# Patient Record
Sex: Female | Born: 1950 | Race: White | Hispanic: No | Marital: Married | Smoking: Never smoker
Health system: Southern US, Community
[De-identification: ages and names within clinical notes are randomized; demographics above are authoritative.]

## PROBLEM LIST (undated history)

## (undated) DIAGNOSIS — N2581 Secondary hyperparathyroidism of renal origin: Secondary | ICD-10-CM

## (undated) DIAGNOSIS — E785 Hyperlipidemia, unspecified: Secondary | ICD-10-CM

## (undated) DIAGNOSIS — I480 Paroxysmal atrial fibrillation: Secondary | ICD-10-CM

## (undated) DIAGNOSIS — E119 Type 2 diabetes mellitus without complications: Secondary | ICD-10-CM

## (undated) DIAGNOSIS — Z87898 Personal history of other specified conditions: Secondary | ICD-10-CM

## (undated) DIAGNOSIS — E049 Nontoxic goiter, unspecified: Secondary | ICD-10-CM

## (undated) DIAGNOSIS — I428 Other cardiomyopathies: Secondary | ICD-10-CM

## (undated) DIAGNOSIS — G4733 Obstructive sleep apnea (adult) (pediatric): Secondary | ICD-10-CM

## (undated) DIAGNOSIS — D649 Anemia, unspecified: Secondary | ICD-10-CM

## (undated) DIAGNOSIS — T4145XA Adverse effect of unspecified anesthetic, initial encounter: Secondary | ICD-10-CM

## (undated) DIAGNOSIS — Z950 Presence of cardiac pacemaker: Secondary | ICD-10-CM

## (undated) DIAGNOSIS — E063 Autoimmune thyroiditis: Secondary | ICD-10-CM

## (undated) DIAGNOSIS — I10 Essential (primary) hypertension: Secondary | ICD-10-CM

## (undated) DIAGNOSIS — N159 Renal tubulo-interstitial disease, unspecified: Secondary | ICD-10-CM

## (undated) DIAGNOSIS — L97919 Non-pressure chronic ulcer of unspecified part of right lower leg with unspecified severity: Secondary | ICD-10-CM

## (undated) DIAGNOSIS — R5383 Other fatigue: Secondary | ICD-10-CM

## (undated) DIAGNOSIS — E559 Vitamin D deficiency, unspecified: Secondary | ICD-10-CM

## (undated) DIAGNOSIS — I4891 Unspecified atrial fibrillation: Secondary | ICD-10-CM

## (undated) DIAGNOSIS — Z905 Acquired absence of kidney: Secondary | ICD-10-CM

## (undated) DIAGNOSIS — L97929 Non-pressure chronic ulcer of unspecified part of left lower leg with unspecified severity: Secondary | ICD-10-CM

## (undated) DIAGNOSIS — Z5189 Encounter for other specified aftercare: Secondary | ICD-10-CM

## (undated) DIAGNOSIS — M797 Fibromyalgia: Secondary | ICD-10-CM

## (undated) DIAGNOSIS — N8502 Endometrial intraepithelial neoplasia [EIN]: Secondary | ICD-10-CM

## (undated) DIAGNOSIS — I2721 Secondary pulmonary arterial hypertension: Secondary | ICD-10-CM

## (undated) DIAGNOSIS — R931 Abnormal findings on diagnostic imaging of heart and coronary circulation: Secondary | ICD-10-CM

## (undated) DIAGNOSIS — I5030 Unspecified diastolic (congestive) heart failure: Secondary | ICD-10-CM

## (undated) DIAGNOSIS — Z8679 Personal history of other diseases of the circulatory system: Secondary | ICD-10-CM

## (undated) DIAGNOSIS — I2789 Other specified pulmonary heart diseases: Secondary | ICD-10-CM

## (undated) HISTORY — PX: CHOLECYSTECTOMY: SHX55

## (undated) HISTORY — DX: Personal history of other specified conditions: Z87.898

## (undated) HISTORY — DX: Vitamin D deficiency, unspecified: E55.9

## (undated) HISTORY — DX: Other cardiomyopathies: I42.8

## (undated) HISTORY — DX: Renal tubulo-interstitial disease, unspecified: N15.9

## (undated) HISTORY — DX: Secondary pulmonary arterial hypertension: I27.21

## (undated) HISTORY — DX: Morbid (severe) obesity due to excess calories: E66.01

## (undated) HISTORY — DX: Fibromyalgia: M79.7

## (undated) HISTORY — DX: Unspecified atrial fibrillation: I48.91

## (undated) HISTORY — DX: Type 2 diabetes mellitus without complications: E11.9

## (undated) HISTORY — DX: Endometrial intraepithelial neoplasia (EIN): N85.02

## (undated) HISTORY — DX: Other specified pulmonary heart diseases: I27.89

## (undated) HISTORY — DX: Personal history of other diseases of the circulatory system: Z86.79

## (undated) HISTORY — DX: Essential (primary) hypertension: I10

## (undated) HISTORY — PX: KNEE SURGERY: SHX244

## (undated) HISTORY — DX: Hyperlipidemia, unspecified: E78.5

## (undated) HISTORY — PX: OTHER SURGICAL HISTORY: SHX169

## (undated) HISTORY — DX: Other fatigue: R53.83

## (undated) HISTORY — DX: Autoimmune thyroiditis: E06.3

## (undated) HISTORY — DX: Nontoxic goiter, unspecified: E04.9

## (undated) HISTORY — DX: Presence of cardiac pacemaker: Z95.0

## (undated) HISTORY — DX: Abnormal findings on diagnostic imaging of heart and coronary circulation: R93.1

## (undated) HISTORY — DX: Secondary hyperparathyroidism of renal origin: N25.81

## (undated) HISTORY — DX: Obstructive sleep apnea (adult) (pediatric): G47.33

## (undated) HISTORY — DX: Acquired absence of kidney: Z90.5

---

## 1976-01-01 DIAGNOSIS — Z5189 Encounter for other specified aftercare: Secondary | ICD-10-CM

## 1976-01-01 DIAGNOSIS — IMO0001 Reserved for inherently not codable concepts without codable children: Secondary | ICD-10-CM

## 1976-01-01 HISTORY — DX: Encounter for other specified aftercare: Z51.89

## 1976-01-01 HISTORY — DX: Reserved for inherently not codable concepts without codable children: IMO0001

## 1976-12-31 HISTORY — PX: ECTOPIC PREGNANCY SURGERY: SHX613

## 1997-12-31 HISTORY — PX: HERNIA REPAIR: SHX51

## 1998-05-10 ENCOUNTER — Inpatient Hospital Stay (HOSPITAL_COMMUNITY): Admission: EM | Admit: 1998-05-10 | Discharge: 1998-05-15 | Payer: Self-pay | Admitting: Emergency Medicine

## 1998-06-01 ENCOUNTER — Ambulatory Visit (HOSPITAL_COMMUNITY): Admission: RE | Admit: 1998-06-01 | Discharge: 1998-06-01 | Payer: Self-pay

## 1998-06-01 ENCOUNTER — Inpatient Hospital Stay (HOSPITAL_COMMUNITY): Admission: AD | Admit: 1998-06-01 | Discharge: 1998-06-13 | Payer: Self-pay

## 1998-06-01 ENCOUNTER — Other Ambulatory Visit: Admission: RE | Admit: 1998-06-01 | Discharge: 1998-06-01 | Payer: Self-pay | Admitting: Internal Medicine

## 1998-07-15 ENCOUNTER — Ambulatory Visit (HOSPITAL_COMMUNITY): Admission: RE | Admit: 1998-07-15 | Discharge: 1998-07-15 | Payer: Self-pay | Admitting: Cardiology

## 1998-07-18 ENCOUNTER — Observation Stay (HOSPITAL_COMMUNITY): Admission: AD | Admit: 1998-07-18 | Discharge: 1998-07-19 | Payer: Self-pay | Admitting: Cardiology

## 1998-11-08 ENCOUNTER — Inpatient Hospital Stay (HOSPITAL_COMMUNITY): Admission: AD | Admit: 1998-11-08 | Discharge: 1998-11-15 | Payer: Self-pay | Admitting: Internal Medicine

## 1998-11-09 ENCOUNTER — Encounter: Payer: Self-pay | Admitting: Internal Medicine

## 1998-11-14 ENCOUNTER — Encounter: Payer: Self-pay | Admitting: Internal Medicine

## 1999-08-20 ENCOUNTER — Inpatient Hospital Stay (HOSPITAL_COMMUNITY): Admission: EM | Admit: 1999-08-20 | Discharge: 1999-08-23 | Payer: Self-pay | Admitting: Emergency Medicine

## 1999-08-21 ENCOUNTER — Encounter: Payer: Self-pay | Admitting: Cardiology

## 1999-09-07 ENCOUNTER — Encounter: Admission: RE | Admit: 1999-09-07 | Discharge: 1999-12-06 | Payer: Self-pay | Admitting: Internal Medicine

## 1999-10-12 ENCOUNTER — Encounter: Payer: Self-pay | Admitting: Internal Medicine

## 1999-10-12 ENCOUNTER — Ambulatory Visit (HOSPITAL_COMMUNITY): Admission: RE | Admit: 1999-10-12 | Discharge: 1999-10-12 | Payer: Self-pay | Admitting: Internal Medicine

## 1999-12-08 ENCOUNTER — Encounter: Admission: RE | Admit: 1999-12-08 | Discharge: 2000-03-07 | Payer: Self-pay | Admitting: Internal Medicine

## 2000-01-17 ENCOUNTER — Encounter: Admission: RE | Admit: 2000-01-17 | Discharge: 2000-01-17 | Payer: Self-pay | Admitting: Internal Medicine

## 2000-01-17 ENCOUNTER — Encounter: Payer: Self-pay | Admitting: Internal Medicine

## 2000-01-25 ENCOUNTER — Encounter: Payer: Self-pay | Admitting: Internal Medicine

## 2000-01-25 ENCOUNTER — Inpatient Hospital Stay (HOSPITAL_COMMUNITY): Admission: EM | Admit: 2000-01-25 | Discharge: 2000-02-04 | Payer: Self-pay | Admitting: Emergency Medicine

## 2000-01-27 ENCOUNTER — Encounter: Payer: Self-pay | Admitting: Internal Medicine

## 2000-02-02 ENCOUNTER — Encounter: Payer: Self-pay | Admitting: Internal Medicine

## 2000-03-01 ENCOUNTER — Ambulatory Visit (HOSPITAL_COMMUNITY): Admission: RE | Admit: 2000-03-01 | Discharge: 2000-03-01 | Payer: Self-pay | Admitting: Cardiology

## 2000-03-04 ENCOUNTER — Encounter: Payer: Self-pay | Admitting: Cardiology

## 2000-03-04 ENCOUNTER — Ambulatory Visit (HOSPITAL_COMMUNITY): Admission: RE | Admit: 2000-03-04 | Discharge: 2000-03-04 | Payer: Self-pay | Admitting: Cardiology

## 2000-03-27 ENCOUNTER — Encounter: Payer: Self-pay | Admitting: Internal Medicine

## 2000-03-27 ENCOUNTER — Encounter: Admission: RE | Admit: 2000-03-27 | Discharge: 2000-03-27 | Payer: Self-pay | Admitting: Internal Medicine

## 2000-05-10 ENCOUNTER — Encounter: Payer: Self-pay | Admitting: Internal Medicine

## 2000-05-10 ENCOUNTER — Inpatient Hospital Stay (HOSPITAL_COMMUNITY): Admission: EM | Admit: 2000-05-10 | Discharge: 2000-05-21 | Payer: Self-pay | Admitting: Emergency Medicine

## 2000-05-23 ENCOUNTER — Encounter (HOSPITAL_COMMUNITY): Admission: RE | Admit: 2000-05-23 | Discharge: 2000-08-21 | Payer: Self-pay | Admitting: Internal Medicine

## 2000-06-05 ENCOUNTER — Emergency Department (HOSPITAL_COMMUNITY): Admission: EM | Admit: 2000-06-05 | Discharge: 2000-06-05 | Payer: Self-pay | Admitting: Emergency Medicine

## 2000-11-25 ENCOUNTER — Encounter: Admission: RE | Admit: 2000-11-25 | Discharge: 2001-01-07 | Payer: Self-pay | Admitting: Internal Medicine

## 2001-01-15 ENCOUNTER — Encounter: Admission: RE | Admit: 2001-01-15 | Discharge: 2001-02-18 | Payer: Self-pay | Admitting: Internal Medicine

## 2001-08-12 ENCOUNTER — Ambulatory Visit (HOSPITAL_COMMUNITY): Admission: RE | Admit: 2001-08-12 | Discharge: 2001-08-12 | Payer: Self-pay | Admitting: Cardiology

## 2001-08-13 ENCOUNTER — Encounter: Payer: Self-pay | Admitting: Cardiology

## 2002-04-09 ENCOUNTER — Encounter: Admission: RE | Admit: 2002-04-09 | Discharge: 2002-04-09 | Payer: Self-pay | Admitting: Cardiology

## 2002-04-09 ENCOUNTER — Encounter: Payer: Self-pay | Admitting: Cardiology

## 2002-04-13 ENCOUNTER — Ambulatory Visit (HOSPITAL_COMMUNITY): Admission: RE | Admit: 2002-04-13 | Discharge: 2002-04-14 | Payer: Self-pay | Admitting: Cardiology

## 2002-04-24 ENCOUNTER — Inpatient Hospital Stay (HOSPITAL_COMMUNITY): Admission: AD | Admit: 2002-04-24 | Discharge: 2002-04-26 | Payer: Self-pay | Admitting: Cardiology

## 2002-11-20 ENCOUNTER — Encounter: Payer: Self-pay | Admitting: Family Medicine

## 2002-11-20 ENCOUNTER — Encounter: Admission: RE | Admit: 2002-11-20 | Discharge: 2002-11-20 | Payer: Self-pay | Admitting: Internal Medicine

## 2003-03-08 ENCOUNTER — Encounter: Payer: Self-pay | Admitting: Internal Medicine

## 2003-03-08 ENCOUNTER — Encounter: Admission: RE | Admit: 2003-03-08 | Discharge: 2003-03-08 | Payer: Self-pay | Admitting: Internal Medicine

## 2003-04-19 ENCOUNTER — Encounter: Admission: RE | Admit: 2003-04-19 | Discharge: 2003-07-18 | Payer: Self-pay | Admitting: Internal Medicine

## 2003-07-23 ENCOUNTER — Emergency Department (HOSPITAL_COMMUNITY): Admission: AD | Admit: 2003-07-23 | Discharge: 2003-07-23 | Payer: Self-pay | Admitting: Emergency Medicine

## 2004-01-24 ENCOUNTER — Encounter: Admission: RE | Admit: 2004-01-24 | Discharge: 2004-01-24 | Payer: Self-pay | Admitting: Cardiology

## 2004-01-27 ENCOUNTER — Ambulatory Visit (HOSPITAL_COMMUNITY): Admission: RE | Admit: 2004-01-27 | Discharge: 2004-01-27 | Payer: Self-pay | Admitting: Cardiology

## 2005-02-26 ENCOUNTER — Encounter: Admission: RE | Admit: 2005-02-26 | Discharge: 2005-02-26 | Payer: Self-pay | Admitting: Cardiology

## 2005-03-02 ENCOUNTER — Ambulatory Visit (HOSPITAL_COMMUNITY): Admission: RE | Admit: 2005-03-02 | Discharge: 2005-03-03 | Payer: Self-pay | Admitting: *Deleted

## 2005-05-29 ENCOUNTER — Ambulatory Visit: Payer: Self-pay | Admitting: Internal Medicine

## 2005-07-05 ENCOUNTER — Encounter: Admission: RE | Admit: 2005-07-05 | Discharge: 2005-07-05 | Payer: Self-pay | Admitting: Cardiology

## 2005-07-20 ENCOUNTER — Ambulatory Visit (HOSPITAL_COMMUNITY): Admission: RE | Admit: 2005-07-20 | Discharge: 2005-07-20 | Payer: Self-pay | Admitting: Internal Medicine

## 2005-08-21 ENCOUNTER — Encounter: Admission: RE | Admit: 2005-08-21 | Discharge: 2005-08-21 | Payer: Self-pay | Admitting: Internal Medicine

## 2005-10-19 ENCOUNTER — Encounter: Admission: RE | Admit: 2005-10-19 | Discharge: 2005-10-19 | Payer: Self-pay | Admitting: Internal Medicine

## 2005-10-30 ENCOUNTER — Ambulatory Visit: Payer: Self-pay | Admitting: "Endocrinology

## 2005-11-27 ENCOUNTER — Ambulatory Visit: Payer: Self-pay | Admitting: Internal Medicine

## 2005-12-04 ENCOUNTER — Ambulatory Visit: Payer: Self-pay | Admitting: "Endocrinology

## 2006-01-09 ENCOUNTER — Ambulatory Visit: Payer: Self-pay | Admitting: "Endocrinology

## 2006-02-22 ENCOUNTER — Encounter: Admission: RE | Admit: 2006-02-22 | Discharge: 2006-02-22 | Payer: Self-pay | Admitting: Cardiology

## 2006-02-26 ENCOUNTER — Ambulatory Visit: Payer: Self-pay | Admitting: "Endocrinology

## 2006-02-27 ENCOUNTER — Ambulatory Visit (HOSPITAL_COMMUNITY): Admission: RE | Admit: 2006-02-27 | Discharge: 2006-02-28 | Payer: Self-pay | Admitting: Cardiology

## 2006-03-11 ENCOUNTER — Encounter: Admission: RE | Admit: 2006-03-11 | Discharge: 2006-03-11 | Payer: Self-pay | Admitting: Internal Medicine

## 2006-03-12 ENCOUNTER — Encounter: Admission: RE | Admit: 2006-03-12 | Discharge: 2006-03-12 | Payer: Self-pay | Admitting: Internal Medicine

## 2006-04-25 ENCOUNTER — Encounter (INDEPENDENT_AMBULATORY_CARE_PROVIDER_SITE_OTHER): Payer: Self-pay | Admitting: Specialist

## 2006-04-25 ENCOUNTER — Inpatient Hospital Stay (HOSPITAL_COMMUNITY): Admission: RE | Admit: 2006-04-25 | Discharge: 2006-04-27 | Payer: Self-pay | Admitting: General Surgery

## 2006-04-30 ENCOUNTER — Ambulatory Visit: Payer: Self-pay | Admitting: "Endocrinology

## 2006-05-28 ENCOUNTER — Ambulatory Visit: Payer: Self-pay | Admitting: Internal Medicine

## 2006-07-31 ENCOUNTER — Ambulatory Visit: Payer: Self-pay | Admitting: "Endocrinology

## 2006-09-22 ENCOUNTER — Inpatient Hospital Stay (HOSPITAL_COMMUNITY): Admission: EM | Admit: 2006-09-22 | Discharge: 2006-10-01 | Payer: Self-pay | Admitting: *Deleted

## 2006-09-22 ENCOUNTER — Ambulatory Visit: Payer: Self-pay | Admitting: Infectious Diseases

## 2006-09-23 ENCOUNTER — Encounter: Payer: Self-pay | Admitting: Vascular Surgery

## 2006-11-04 ENCOUNTER — Ambulatory Visit: Payer: Self-pay | Admitting: "Endocrinology

## 2006-11-26 ENCOUNTER — Ambulatory Visit: Payer: Self-pay | Admitting: Internal Medicine

## 2006-12-17 ENCOUNTER — Ambulatory Visit: Payer: Self-pay | Admitting: "Endocrinology

## 2007-02-05 ENCOUNTER — Ambulatory Visit (HOSPITAL_COMMUNITY): Admission: RE | Admit: 2007-02-05 | Discharge: 2007-02-06 | Payer: Self-pay | Admitting: Orthopedic Surgery

## 2007-03-18 ENCOUNTER — Ambulatory Visit: Payer: Self-pay | Admitting: "Endocrinology

## 2007-05-19 ENCOUNTER — Ambulatory Visit: Payer: Self-pay | Admitting: Internal Medicine

## 2007-07-03 ENCOUNTER — Ambulatory Visit: Payer: Self-pay | Admitting: "Endocrinology

## 2007-08-20 ENCOUNTER — Encounter: Admission: RE | Admit: 2007-08-20 | Discharge: 2007-08-20 | Payer: Self-pay | Admitting: Cardiology

## 2007-08-26 ENCOUNTER — Inpatient Hospital Stay (HOSPITAL_COMMUNITY): Admission: RE | Admit: 2007-08-26 | Discharge: 2007-08-27 | Payer: Self-pay | Admitting: Cardiology

## 2007-08-27 ENCOUNTER — Encounter (INDEPENDENT_AMBULATORY_CARE_PROVIDER_SITE_OTHER): Payer: Self-pay | Admitting: Interventional Radiology

## 2007-10-06 ENCOUNTER — Ambulatory Visit: Payer: Self-pay | Admitting: "Endocrinology

## 2007-11-18 ENCOUNTER — Ambulatory Visit: Payer: Self-pay | Admitting: Internal Medicine

## 2007-12-09 ENCOUNTER — Ambulatory Visit: Payer: Self-pay | Admitting: Internal Medicine

## 2007-12-12 ENCOUNTER — Encounter: Payer: Self-pay | Admitting: Internal Medicine

## 2008-01-14 ENCOUNTER — Encounter: Admission: RE | Admit: 2008-01-14 | Discharge: 2008-01-14 | Payer: Self-pay | Admitting: "Endocrinology

## 2008-01-22 ENCOUNTER — Ambulatory Visit: Payer: Self-pay | Admitting: "Endocrinology

## 2008-02-04 ENCOUNTER — Encounter: Payer: Self-pay | Admitting: Internal Medicine

## 2008-04-21 ENCOUNTER — Encounter: Payer: Self-pay | Admitting: Internal Medicine

## 2008-04-26 ENCOUNTER — Ambulatory Visit: Payer: Self-pay | Admitting: "Endocrinology

## 2008-05-17 DIAGNOSIS — I2789 Other specified pulmonary heart diseases: Secondary | ICD-10-CM

## 2008-05-17 DIAGNOSIS — Z8679 Personal history of other diseases of the circulatory system: Secondary | ICD-10-CM

## 2008-05-17 DIAGNOSIS — Z950 Presence of cardiac pacemaker: Secondary | ICD-10-CM

## 2008-05-17 DIAGNOSIS — I10 Essential (primary) hypertension: Secondary | ICD-10-CM | POA: Insufficient documentation

## 2008-05-17 DIAGNOSIS — G4733 Obstructive sleep apnea (adult) (pediatric): Secondary | ICD-10-CM

## 2008-05-17 DIAGNOSIS — E119 Type 2 diabetes mellitus without complications: Secondary | ICD-10-CM

## 2008-05-17 DIAGNOSIS — E063 Autoimmune thyroiditis: Secondary | ICD-10-CM

## 2008-05-18 ENCOUNTER — Ambulatory Visit: Payer: Self-pay | Admitting: Internal Medicine

## 2008-05-27 ENCOUNTER — Encounter: Payer: Self-pay | Admitting: Internal Medicine

## 2008-07-01 ENCOUNTER — Ambulatory Visit (HOSPITAL_COMMUNITY): Admission: RE | Admit: 2008-07-01 | Discharge: 2008-07-02 | Payer: Self-pay | Admitting: Orthopedic Surgery

## 2008-07-19 ENCOUNTER — Encounter: Payer: Self-pay | Admitting: Internal Medicine

## 2008-10-15 ENCOUNTER — Ambulatory Visit: Payer: Self-pay | Admitting: Internal Medicine

## 2008-10-21 ENCOUNTER — Ambulatory Visit: Payer: Self-pay | Admitting: "Endocrinology

## 2008-11-16 ENCOUNTER — Encounter: Payer: Self-pay | Admitting: "Endocrinology

## 2008-11-16 LAB — CONVERTED CEMR LAB
Calcium, Total (PTH): 8.5 mg/dL (ref 8.4–10.5)
PTH: 179.1 pg/mL — ABNORMAL HIGH (ref 14.0–72.0)
Vit D, 1,25-Dihydroxy: 38 (ref 30–89)

## 2008-11-23 ENCOUNTER — Ambulatory Visit: Payer: Self-pay | Admitting: Internal Medicine

## 2008-11-23 DIAGNOSIS — R0602 Shortness of breath: Secondary | ICD-10-CM

## 2008-12-03 ENCOUNTER — Ambulatory Visit: Payer: Self-pay | Admitting: Internal Medicine

## 2008-12-10 ENCOUNTER — Telehealth (INDEPENDENT_AMBULATORY_CARE_PROVIDER_SITE_OTHER): Payer: Self-pay | Admitting: *Deleted

## 2008-12-23 ENCOUNTER — Encounter: Payer: Self-pay | Admitting: Internal Medicine

## 2008-12-27 ENCOUNTER — Ambulatory Visit: Payer: Self-pay | Admitting: Internal Medicine

## 2008-12-31 DIAGNOSIS — T8859XA Other complications of anesthesia, initial encounter: Secondary | ICD-10-CM

## 2008-12-31 HISTORY — DX: Other complications of anesthesia, initial encounter: T88.59XA

## 2009-02-01 ENCOUNTER — Ambulatory Visit: Payer: Self-pay | Admitting: Internal Medicine

## 2009-02-23 ENCOUNTER — Ambulatory Visit: Payer: Self-pay | Admitting: "Endocrinology

## 2009-02-25 ENCOUNTER — Ambulatory Visit: Payer: Self-pay | Admitting: Internal Medicine

## 2009-03-22 ENCOUNTER — Encounter: Payer: Self-pay | Admitting: Internal Medicine

## 2009-04-18 ENCOUNTER — Ambulatory Visit: Payer: Self-pay | Admitting: Internal Medicine

## 2009-04-28 ENCOUNTER — Encounter: Payer: Self-pay | Admitting: Internal Medicine

## 2009-05-02 ENCOUNTER — Ambulatory Visit: Payer: Self-pay | Admitting: Internal Medicine

## 2009-05-02 LAB — CONVERTED CEMR LAB
BUN: 36 mg/dL — ABNORMAL HIGH (ref 6–23)
Basophils Relative: 0.2 % (ref 0.0–3.0)
CO2: 32 meq/L (ref 19–32)
Chloride: 102 meq/L (ref 96–112)
Creatinine, Ser: 1.6 mg/dL — ABNORMAL HIGH (ref 0.4–1.2)
Eosinophils Relative: 1.1 % (ref 0.0–5.0)
Hemoglobin: 13.5 g/dL (ref 12.0–15.0)
Lymphocytes Relative: 17.7 % (ref 12.0–46.0)
MCHC: 34.4 g/dL (ref 30.0–36.0)
Monocytes Relative: 6.1 % (ref 3.0–12.0)
Neutro Abs: 5.5 10*3/uL (ref 1.4–7.7)
Neutrophils Relative %: 74.9 % (ref 43.0–77.0)
Potassium: 4 meq/L (ref 3.5–5.1)
Prothrombin Time: 25.2 s — ABNORMAL HIGH (ref 10.9–13.3)
RBC: 4.31 M/uL (ref 3.87–5.11)
WBC: 7.4 10*3/uL (ref 4.5–10.5)

## 2009-05-10 ENCOUNTER — Ambulatory Visit (HOSPITAL_COMMUNITY): Admission: RE | Admit: 2009-05-10 | Discharge: 2009-05-10 | Payer: Self-pay | Admitting: Internal Medicine

## 2009-05-10 ENCOUNTER — Ambulatory Visit: Payer: Self-pay | Admitting: Internal Medicine

## 2009-05-16 ENCOUNTER — Ambulatory Visit (HOSPITAL_COMMUNITY): Admission: RE | Admit: 2009-05-16 | Discharge: 2009-05-16 | Payer: Self-pay | Admitting: "Endocrinology

## 2009-05-27 ENCOUNTER — Ambulatory Visit: Payer: Self-pay | Admitting: Cardiology

## 2009-05-31 ENCOUNTER — Encounter: Payer: Self-pay | Admitting: Internal Medicine

## 2009-06-09 ENCOUNTER — Ambulatory Visit: Payer: Self-pay | Admitting: "Endocrinology

## 2009-06-27 ENCOUNTER — Ambulatory Visit: Payer: Self-pay | Admitting: Internal Medicine

## 2009-06-28 ENCOUNTER — Ambulatory Visit: Payer: Self-pay | Admitting: Internal Medicine

## 2009-07-05 ENCOUNTER — Encounter: Payer: Self-pay | Admitting: Internal Medicine

## 2009-07-05 LAB — CBC WITH DIFFERENTIAL/PLATELET
BASO%: 0.5 % (ref 0.0–2.0)
HCT: 36.8 % (ref 34.8–46.6)
LYMPH%: 11.2 % — ABNORMAL LOW (ref 14.0–49.7)
MCHC: 33.5 g/dL (ref 31.5–36.0)
MCV: 91.6 fL (ref 79.5–101.0)
MONO#: 0.4 10*3/uL (ref 0.1–0.9)
MONO%: 3.8 % (ref 0.0–14.0)
NEUT%: 83.9 % — ABNORMAL HIGH (ref 38.4–76.8)
Platelets: 181 10*3/uL (ref 145–400)
WBC: 9.7 10*3/uL (ref 3.9–10.3)

## 2009-07-07 LAB — LACTATE DEHYDROGENASE: LDH: 193 U/L (ref 94–250)

## 2009-07-07 LAB — COMPREHENSIVE METABOLIC PANEL
Alkaline Phosphatase: 70 U/L (ref 39–117)
CO2: 26 mEq/L (ref 19–32)
Creatinine, Ser: 1.38 mg/dL — ABNORMAL HIGH (ref 0.40–1.20)
Glucose, Bld: 164 mg/dL — ABNORMAL HIGH (ref 70–99)
Total Bilirubin: 0.6 mg/dL (ref 0.3–1.2)

## 2009-07-07 LAB — IMMUNOFIXATION ELECTROPHORESIS
IgA: 808 mg/dL — ABNORMAL HIGH (ref 68–378)
IgM, Serum: 77 mg/dL (ref 60–263)
Total Protein, Serum Electrophoresis: 7.3 g/dL (ref 6.0–8.3)

## 2009-07-11 LAB — UIFE/LIGHT CHAINS/TP QN, 24-HR UR
Albumin, U: DETECTED
Alpha 1, Urine: DETECTED — AB
Free Kappa/Lambda Ratio: 133.79 ratio — ABNORMAL HIGH (ref 0.46–4.00)
Free Lambda Excretion/Day: 6.09 mg/d
Time: 24 hours
Total Protein, Urine-Ur/day: 839 mg/d — ABNORMAL HIGH (ref 10–140)
Total Protein, Urine: 79.9 mg/dL

## 2009-07-14 ENCOUNTER — Ambulatory Visit (HOSPITAL_COMMUNITY): Admission: RE | Admit: 2009-07-14 | Discharge: 2009-07-14 | Payer: Self-pay | Admitting: Internal Medicine

## 2009-07-15 ENCOUNTER — Encounter (INDEPENDENT_AMBULATORY_CARE_PROVIDER_SITE_OTHER): Payer: Self-pay | Admitting: Interventional Radiology

## 2009-07-15 ENCOUNTER — Ambulatory Visit (HOSPITAL_COMMUNITY): Admission: RE | Admit: 2009-07-15 | Discharge: 2009-07-15 | Payer: Self-pay | Admitting: Internal Medicine

## 2009-07-21 LAB — CBC WITH DIFFERENTIAL/PLATELET
Eosinophils Absolute: 0.1 10*3/uL (ref 0.0–0.5)
MCV: 92.4 fL (ref 79.5–101.0)
MONO#: 0.3 10*3/uL (ref 0.1–0.9)
MONO%: 5.5 % (ref 0.0–14.0)
NEUT#: 4.5 10*3/uL (ref 1.5–6.5)
RBC: 3.92 10*6/uL (ref 3.70–5.45)
RDW: 16.5 % — ABNORMAL HIGH (ref 11.2–14.5)
WBC: 5.8 10*3/uL (ref 3.9–10.3)
lymph#: 0.9 10*3/uL (ref 0.9–3.3)

## 2009-07-26 ENCOUNTER — Encounter: Payer: Self-pay | Admitting: Internal Medicine

## 2009-08-11 ENCOUNTER — Encounter: Admission: RE | Admit: 2009-08-11 | Discharge: 2009-08-11 | Payer: Self-pay | Admitting: Internal Medicine

## 2009-08-22 ENCOUNTER — Ambulatory Visit: Payer: Self-pay | Admitting: "Endocrinology

## 2009-09-09 ENCOUNTER — Ambulatory Visit: Payer: Self-pay | Admitting: Internal Medicine

## 2009-09-21 ENCOUNTER — Encounter: Admission: RE | Admit: 2009-09-21 | Discharge: 2009-09-21 | Payer: Self-pay | Admitting: Nephrology

## 2009-10-11 ENCOUNTER — Ambulatory Visit: Payer: Self-pay | Admitting: Internal Medicine

## 2009-12-12 ENCOUNTER — Encounter: Payer: Self-pay | Admitting: Internal Medicine

## 2010-02-21 ENCOUNTER — Ambulatory Visit: Payer: Self-pay | Admitting: "Endocrinology

## 2010-02-21 ENCOUNTER — Ambulatory Visit: Payer: Self-pay | Admitting: Internal Medicine

## 2010-03-24 ENCOUNTER — Ambulatory Visit: Payer: Self-pay | Admitting: Internal Medicine

## 2010-04-26 ENCOUNTER — Ambulatory Visit: Payer: Self-pay | Admitting: Internal Medicine

## 2010-05-03 ENCOUNTER — Ambulatory Visit: Payer: Self-pay | Admitting: "Endocrinology

## 2010-05-04 ENCOUNTER — Encounter: Payer: Self-pay | Admitting: Internal Medicine

## 2010-06-02 ENCOUNTER — Ambulatory Visit: Payer: Self-pay | Admitting: Internal Medicine

## 2010-06-26 ENCOUNTER — Ambulatory Visit: Payer: Self-pay | Admitting: Internal Medicine

## 2010-06-30 ENCOUNTER — Ambulatory Visit: Payer: Self-pay | Admitting: Internal Medicine

## 2010-07-31 ENCOUNTER — Encounter (INDEPENDENT_AMBULATORY_CARE_PROVIDER_SITE_OTHER): Payer: Self-pay | Admitting: *Deleted

## 2010-08-14 ENCOUNTER — Encounter: Admission: RE | Admit: 2010-08-14 | Discharge: 2010-08-14 | Payer: Self-pay | Admitting: Internal Medicine

## 2010-08-30 ENCOUNTER — Ambulatory Visit (HOSPITAL_COMMUNITY): Payer: Self-pay | Admitting: Oncology

## 2010-08-30 ENCOUNTER — Encounter (HOSPITAL_COMMUNITY)
Admission: RE | Admit: 2010-08-30 | Discharge: 2010-09-29 | Payer: Self-pay | Source: Home / Self Care | Admitting: Oncology

## 2010-09-19 ENCOUNTER — Ambulatory Visit: Payer: Self-pay | Admitting: "Endocrinology

## 2010-09-20 ENCOUNTER — Encounter: Admission: RE | Admit: 2010-09-20 | Discharge: 2010-09-20 | Payer: Self-pay | Admitting: "Endocrinology

## 2010-10-13 ENCOUNTER — Ambulatory Visit: Payer: Self-pay | Admitting: Internal Medicine

## 2010-10-27 ENCOUNTER — Ambulatory Visit (HOSPITAL_COMMUNITY): Payer: Self-pay | Admitting: Oncology

## 2010-12-11 ENCOUNTER — Encounter
Admission: RE | Admit: 2010-12-11 | Discharge: 2010-12-11 | Payer: Self-pay | Source: Home / Self Care | Attending: Cardiology | Admitting: Cardiology

## 2010-12-12 ENCOUNTER — Inpatient Hospital Stay (HOSPITAL_COMMUNITY)
Admission: EM | Admit: 2010-12-12 | Discharge: 2010-12-15 | Payer: Self-pay | Source: Home / Self Care | Attending: Cardiology | Admitting: Cardiology

## 2010-12-18 ENCOUNTER — Ambulatory Visit: Payer: Self-pay | Admitting: Internal Medicine

## 2010-12-19 ENCOUNTER — Ambulatory Visit: Payer: Self-pay | Admitting: Internal Medicine

## 2010-12-20 ENCOUNTER — Ambulatory Visit: Payer: Self-pay | Admitting: Internal Medicine

## 2010-12-26 ENCOUNTER — Encounter: Payer: Self-pay | Admitting: Internal Medicine

## 2011-01-03 ENCOUNTER — Ambulatory Visit (HOSPITAL_COMMUNITY)
Admission: RE | Admit: 2011-01-03 | Discharge: 2011-01-04 | Payer: Self-pay | Source: Home / Self Care | Attending: Cardiology | Admitting: Cardiology

## 2011-01-03 ENCOUNTER — Encounter (INDEPENDENT_AMBULATORY_CARE_PROVIDER_SITE_OTHER): Payer: Self-pay | Admitting: Cardiology

## 2011-01-03 LAB — CBC
HCT: 39.9 % (ref 36.0–46.0)
Hemoglobin: 12.6 g/dL (ref 12.0–15.0)
MCH: 29.3 pg (ref 26.0–34.0)
MCHC: 31.6 g/dL (ref 30.0–36.0)
MCV: 92.8 fL (ref 78.0–100.0)
Platelets: 168 10*3/uL (ref 150–400)
RBC: 4.3 MIL/uL (ref 3.87–5.11)
RDW: 17.3 % — ABNORMAL HIGH (ref 11.5–15.5)
WBC: 5.7 10*3/uL (ref 4.0–10.5)

## 2011-01-03 LAB — GLUCOSE, CAPILLARY
Glucose-Capillary: 183 mg/dL — ABNORMAL HIGH (ref 70–99)
Glucose-Capillary: 255 mg/dL — ABNORMAL HIGH (ref 70–99)
Glucose-Capillary: 272 mg/dL — ABNORMAL HIGH (ref 70–99)

## 2011-01-03 LAB — TSH: TSH: 2.7 u[IU]/mL (ref 0.350–4.500)

## 2011-01-03 LAB — PROTIME-INR
INR: 1.14 (ref 0.00–1.49)
Prothrombin Time: 14.8 seconds (ref 11.6–15.2)

## 2011-01-04 LAB — POCT I-STAT 3, VENOUS BLOOD GAS (G3P V)
Bicarbonate: 26.2 mEq/L — ABNORMAL HIGH (ref 20.0–24.0)
O2 Saturation: 61 %
TCO2: 28 mmol/L (ref 0–100)
pCO2, Ven: 46.1 mmHg (ref 45.0–50.0)
pH, Ven: 7.363 — ABNORMAL HIGH (ref 7.250–7.300)
pO2, Ven: 33 mmHg (ref 30.0–45.0)

## 2011-01-04 LAB — PROTIME-INR
INR: 1.1 (ref 0.00–1.49)
Prothrombin Time: 14.4 seconds (ref 11.6–15.2)

## 2011-01-04 LAB — BASIC METABOLIC PANEL
BUN: 18 mg/dL (ref 6–23)
CO2: 28 mEq/L (ref 19–32)
Calcium: 8.9 mg/dL (ref 8.4–10.5)
Chloride: 105 mEq/L (ref 96–112)
Creatinine, Ser: 1.11 mg/dL (ref 0.4–1.2)
GFR calc Af Amer: >60 mL/min (ref >60)
GFR calc non Af Amer: 50 mL/min — ABNORMAL LOW (ref >60)
Glucose, Bld: 232 mg/dL — ABNORMAL HIGH (ref 70–99)
Potassium: 4.5 mEq/L (ref 3.5–5.1)
Sodium: 138 mEq/L (ref 135–145)

## 2011-01-04 LAB — POCT I-STAT 3, ART BLOOD GAS (G3+)
Acid-Base Excess: 1 mmol/L (ref 0.0–2.0)
Bicarbonate: 26.6 mEq/L — ABNORMAL HIGH (ref 20.0–24.0)
O2 Saturation: 93 %
TCO2: 28 mmol/L (ref 0–100)
pCO2 arterial: 42.7 mmHg (ref 35.0–45.0)
pH, Arterial: 7.402 — ABNORMAL HIGH (ref 7.350–7.400)
pO2, Arterial: 68 mmHg — ABNORMAL LOW (ref 80.0–100.0)

## 2011-01-04 LAB — GLUCOSE, CAPILLARY: Glucose-Capillary: 214 mg/dL — ABNORMAL HIGH (ref 70–99)

## 2011-01-17 ENCOUNTER — Ambulatory Visit
Admission: RE | Admit: 2011-01-17 | Discharge: 2011-01-17 | Payer: Self-pay | Source: Home / Self Care | Attending: "Endocrinology | Admitting: "Endocrinology

## 2011-01-18 NOTE — Procedures (Signed)
[name], [name]             ACCOUNT NO.:  [id number]  MEDICAL RECORD NO.:  [id number]          PATIENT TYPE:  OIB  LOCATION:  6529                         FACILITY:  [facility]  PHYSICIAN:  Thereasa Solo. Karista Aispuro, M.D. DATE OF BIRTH:  1950/04/26  DATE OF PROCEDURE:  01/03/2011 DATE OF DISCHARGE:                           CARDIAC CATHETERIZATION   INDICATIONS FOR TEST:  This 61 year old female has developed severe dyspnea on exertion.  She has had previous cardiac catheterization that showed no occlusive coronary disease and an ejection fraction of around 40%.  She had a brief hospitalization about 2 weeks ago because of worsening shortness of breath and her BNP was increased at 520.  She was diuresed but within 2 days was having recurrent problems with dyspnea on exertion.  An echocardiogram performed in the office showed severe mitral regurgitation.  Her LV function was 45% and she had borderline LVH.  She had reversal of flow in the pulmonary arteries and pulmonary hypertension with an estimated pulmonary artery pressure of around 40.  Because of the echo findings, she is brought in for outpatient right and left heart catheterization for assessment of mitral regurgitation.  After obtaining informed consent, the patient was prepped and draped in the usual sterile fashion exposing the right groin.  Following local anesthetic with 1% Xylocaine, the Seldinger technique was employed and a 5-French introducer sheath was placed in the right femoral artery.  A 7- French introducer sheath was placed in the right femoral vein; however, it took multiple attempts and a Smart needle.  The patient weighed over 350 pounds.  A Swan-Ganz catheter was then advanced through its normal route into the pulmonary artery with hemodynamic monitoring undertaken throughout each station.  Oxygen saturations in the PA and AO were performed.  Cardiac output by thermodilution, ventriculography, and coronary  angiography.  A total of three ventriculograms, two in the RAO and one in the LAO were performed.  COMPLICATIONS:  None.  RESULTS: 1. Ventriculography:  Ventriculography revealed the left ventricular     cavity to be slightly dilated.  The inferior apical segments were     hypokinetic and the ejection fraction was approximately 40%.  There     was no severe mitral regurgitation seen, and despite three     ventriculograms I could never see any significant mitral     regurgitation. 2. Coronary arteriography:     a.     Left main normal and bifurcated.     b.     Circumflex:  Circumflex had two OM vessels that were widely      patent.     c.     LAD:  The LAD crossed the apex of the heart.  One large      first diagonal vessel.  This entire system was free of disease.     d.     Right coronary artery including the PDA and two      posterolateral vessels were free of disease.  HEMODYNAMIC RESULTS:  Right atrial pressure 13, right ventricular pressure 52/7, pulmonary artery pressure 50/23, wedge 26, V-wave of 10, aortic pressure 133/77, left ventricular pressure 131/21.  The left ventricular end-diastolic pressure was 23.  Cardiac output by Fick 6.1, thermo 7.1.  Cardiac index Fick 2.4, thermal dilution 2.8.  Oxygen saturations in the AO 93%, in the PA 61%.  CONCLUSION: 1. No occlusive coronary disease. 2. Dilatation of left ventricle with 40% ejection fraction and     inferior apical wall motion abnormality. 3. Pulmonary hypertension with pulmonary artery pressure of 50/23. 4. Mitral regurgitation:  I could not see any significant mitral     regurgitation to correlate with the echocardiogram findings.  Her     symptoms, however, are consistent with mitral regurgitation, and     because of this, I have scheduled her for a transesophageal     echocardiography later today to resolve the severity of the mitral     regurgitation.          ______________________________ Thereasa Solo  Kyara Boxer, M.D.     ABL/MEDQ  D:  01/03/2011  T:  01/03/2011  Job:  [id number]  cc:   [name], M.D.  Electronically Signed by Julieanne Manson M.D. on 01/18/2011 02:31:55 PM

## 2011-01-18 NOTE — Discharge Summary (Signed)
[name], [name]             ACCOUNT NO.:  [id number]  MEDICAL RECORD NO.:  [id number]          PATIENT TYPE:  OIB  LOCATION:  6529                         FACILITY:  [facility]  PHYSICIAN:  [name], M.D. DATE OF BIRTH:  Nov 28, 1950  DATE OF ADMISSION:  01/03/2011 DATE OF DISCHARGE:  01/04/2011                              DISCHARGE SUMMARY   DISCHARGE DIAGNOSES: 1. Moderate-to-severe mitral regurgitation documented by TEE this     admission. 2. Catheterization this admission revealing basically normal     coronaries. 3. Cardiomyopathy with an ejection fraction of 40%. 4. Paroxysmal atrial fibrillation and atrial flutter, sinus rhythm on     amiodarone, PFTs this admission done showed minimal loss of     functional alveolar capillary surface. 5. Sick sinus syndrome with Medtronic pacemaker in the past. 6. Type 2 non-insulin-dependent diabetes. 7. Morbid obesity and sleep apnea, on CPAP. 8. Treated hypothyroidism with recent TSH of 1.98. 9. Stage III renal insufficiency with a single kidney and a creatinine     of 1.1 this admission, previously 1.5. 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  HOSPITAL COURSE:  [name] is a 61 year old female with multiple medical problems as noted above.  She is followed by Dr. Clarene Duke.  She has had problems with PAF.  She has had dyspnea and fatigue.  She was just admitted on December 13 with congestive heart failure which was felt related to her atrial fibrillation.  She was admitted for diuresis and converted spontaneously during that admission back to sinus rhythm. She saw Dr. Clarene Duke as an outpatient and echocardiogram revealed severe MR.  She was admitted for diagnostic catheterization.  Her Coumadin was held as an outpatient.  This was done on January 03, 2011, by Dr. Clarene Duke. This revealed essentially normal coronaries with an EF of 40%. Interestingly by catheterization she had no significant MR.  A TEE was done on January 03, 2011, by Dr. Lynnea Ferrier which revealed moderate-to- severe MR.  CT angiogram of her chest was done prior to discharge which revealed no evidence of pulmonary embolism.  She continues to have complaints of dyspnea on exertion, she did have mild vascular congestion on CT.  Dr. Clarene Duke feels she can be discharged on January 04, 2011.  We did do PFTs with DLCO to rule out amiodarone-induced pulmonary toxicity. She does have a history of Multaq intolerance in the past.  She will be discharged later today on January 5 and will follow up with Dr. Clarene Duke in a couple of weeks.  We did take her off her Coreg and added low-dose metoprolol with a thought that the Coreg may have had some pulmonary adverse side effects.  Please see med rec for complete discharge medications.  LABORATORY DATA:  TSH 2.7.  BNP 183 on admission.  INR 1.14.  ABG shows a pH 7.40, PCO2 of 47, and PO2 of 68.  Sodium 138, potassium 4.5, BUN 18, and creatinine 1.1.  White count 5.7, hemoglobin 12.6, hematocrit 39.9, and platelets 168.  DISPOSITION:  The patient is discharged in stable condition.  She will take 10 mg of Coumadin today, 5 mg tomorrow, and then resume  her usual dose and have her Coumadin checked on Monday.  She will follow up with Dr. Clarene Duke in a couple of weeks.     Abelino Derrick, P.A.   ______________________________ [name], M.D.    [name]  D:  01/04/2011  T:  01/05/2011  Job:  [id number]  cc:   [name], M.D.  Electronically Signed by Corine Shelter P.A. on 01/10/2011 10:09:49 AM Electronically Signed by Julieanne Manson M.D. on 01/18/2011 02:31:53 PM

## 2011-01-19 ENCOUNTER — Encounter: Payer: Self-pay | Admitting: Internal Medicine

## 2011-01-21 ENCOUNTER — Emergency Department (HOSPITAL_COMMUNITY)
Admission: EM | Admit: 2011-01-21 | Discharge: 2011-01-22 | Payer: Self-pay | Source: Home / Self Care | Admitting: Emergency Medicine

## 2011-01-23 ENCOUNTER — Ambulatory Visit
Admission: RE | Admit: 2011-01-23 | Discharge: 2011-01-23 | Payer: Self-pay | Source: Home / Self Care | Attending: "Endocrinology | Admitting: "Endocrinology

## 2011-01-23 LAB — URINALYSIS, ROUTINE W REFLEX MICROSCOPIC
Bilirubin Urine: NEGATIVE
Ketones, ur: NEGATIVE mg/dL
Protein, ur: 100 mg/dL — AB
Urine Glucose, Fasting: 100 mg/dL — AB

## 2011-01-23 LAB — CBC
MCH: 29 pg (ref 26.0–34.0)
MCHC: 32.2 g/dL (ref 30.0–36.0)
Platelets: 94 10*3/uL — ABNORMAL LOW (ref 150–400)

## 2011-01-23 LAB — BASIC METABOLIC PANEL
BUN: 17 mg/dL (ref 6–23)
CO2: 26 mEq/L (ref 19–32)
Calcium: 8.7 mg/dL (ref 8.4–10.5)
Creatinine, Ser: 1.23 mg/dL — ABNORMAL HIGH (ref 0.4–1.2)
GFR calc Af Amer: 54 mL/min — ABNORMAL LOW (ref >60)

## 2011-01-23 LAB — URINE MICROSCOPIC-ADD ON

## 2011-01-23 LAB — DIFFERENTIAL
Basophils Relative: 0 % (ref 0–1)
Eosinophils Absolute: 0 10*3/uL (ref 0.0–0.7)
Monocytes Absolute: 0.4 10*3/uL (ref 0.1–1.0)
Monocytes Relative: 4 % (ref 3–12)

## 2011-01-23 LAB — DIGOXIN LEVEL: Digoxin Level: 1.3 ng/mL (ref 0.8–2.0)

## 2011-01-24 LAB — URINE CULTURE: Colony Count: 85000

## 2011-01-26 ENCOUNTER — Ambulatory Visit: Admit: 2011-01-26 | Payer: Self-pay | Admitting: Internal Medicine

## 2011-01-26 DIAGNOSIS — N390 Urinary tract infection, site not specified: Secondary | ICD-10-CM

## 2011-01-30 ENCOUNTER — Ambulatory Visit: Admit: 2011-01-30 | Payer: Self-pay | Admitting: Internal Medicine

## 2011-02-01 NOTE — Letter (Signed)
Summary: [facility]   Imported By: Sherian Rein 05/18/2010 14:08:57  _____________________________________________________________________  External Attachment:    Type:   Image     Comment:   External Document

## 2011-02-01 NOTE — Assessment & Plan Note (Signed)
Summary: 6 months/apc   Copy to:  Al Little Primary B[place]don Senters/Referring Anie Juniel:  [place]  CC:  6 month follow up visit -OSA.  History of Present Illness: 12/27/08-OSA, morbid obesity CXR reviewed- mld CE, NAD. Spirometry was normal, without response to bronchodilator. She feels better with improved DOE since switch from amiodarone to dronedrone.  Still has to pace herself and realizes she felt best when she was not on either one. Continues cpap.  06/27/09- OSA, morbid obesity, Atrial fibrillation/ pacemaker Had pacemaker pocket revision. Saw Dr Zenaida Niece for her diabetes and Hashimotos and hypercalcemia. She is almost completely paced. Malaise from all of her endocrine problems. Chest feels tight, blaming current poor air quality. Some wheeze at night, but no cough, wheeze, mucus or  fever during the day. To see Dr Dareen Piano for fibromyagia.   October 13, 2010- OSA, morbid obesity, Atrial fibrillation/ pacemaker Nurse cc: 6 month follow up visit -OSA Continues CPAP all night everynight at 10. Never feels sleepy and credits her meds for that. Doesn't use it for naps. Denies snore through. 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 Thyroid nodules followed by Dr Fransico Michael. Has single kidney with limited renal function. Had to change lisinopril due to cough.  Has had pneumovax twice, needs flu vax.     Preventive Screening-Counseling & Management  Alcohol-Tobacco     Smoking Status: never  Current Medications (verified): 1)  Coumadin 5 Mg  Tabs (Warfarin Sodium) .... Take As Directed 2)  Demadex 100 Mg  Tabs (Torsemide) .... One Half By Mouth Daily 3)  Klor-Con M20 20 Meq  Tbcr (Potassium Chloride Crys Cr) .... Take 1 Tablets By Mouth Once Daily 4)  Digoxin 0.25 Mg Tabs (Digoxin) .... Take One Tablet By Mouth Daily 5)  Synthroid 25 Mcg Tabs (Levothyroxine Sodium) .... Take 2 1/2 By Mouth Once Daily 6)  Cymbalta 60 Mg  Cpep (Duloxetine Hcl) .... Take 1 Tablet By Mouth Once A Day 7)   Nitroglycerin 0.4 Mg/hr  Pt24 (Nitroglycerin) .... Use One At Bedtime 8)  Spiriva Handihaler 18 Mcg  Caps (Tiotropium Bromide Monohydrate) .... Use One Capsule By Mouth Once Daily 9)  Byetta 10 Mcg Pen 10 Mcg/0.52ml Soln (Exenatide) .... Inject 10 Units Two Times A Day 10)  Vitamin D 66440 Unit  Caps (Ergocalciferol) .... One By Mouth Bid 11)  Cpap 10 Advanced 12)  Calcitriol 0.25 Mcg Caps (Calcitriol) .... One By Mouth Two Times A Day 13)  Ambien 10 Mg Tabs (Zolpidem Tartrate) .... Take 1 By Mouth At Bedtime As Needed 14)  Nitro-Dur 0.4 Mg/hr Pt24 (Nitroglycerin) .... Patch On At Night and Off in Am 15)  Citracal Plus  Tabs (Multiple Minerals-Vitamins) .... One By Mouth Ac and Hs 16)  Proair Hfa 108 (90 Base) Mcg/act Aers (Albuterol Sulfate) .... 2 Puffs Four Times A Day As Needed 17)  Ultram 50 Mg Tabs (Tramadol Hcl) .... Take 1 By Mouth Two Times A Day As Needed 18)  Albuterol Sulfate (2.5 Mg/68ml) 0.083% Nebu (Albuterol Sulfate) .Marland Kitchen.. 1 Neb Qid As Needed 19)  Cozaar 50 Mg Tabs (Losartan Potassium) .... Take 1 By Mouth Once Daily 20)  Corodone 300mg  .... Take 1 By Mouth Once Daily  Allergies (verified): 1)  ! * Morphine Sulfate 2)  ! Phenergan 3)  ! Betadine 4)  ! * Ivp Dye 5)  ! Benadryl 6)  ! * Clindamycin 7)  ! Penicillin 8)  ! 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 hypertension; massive MI Father- deceased age 73; Electrocuted Brother-deceased age 64; sudden death; cardiomyopathy Sister- living age 25; FM, arthritis, Hypothyroidism     Social History: Last updated: 05/19/2009 Patient never smoked.   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 Married with no children   Risk Factors: Smoking Status: never (10/13/2010)  Review of Systems      See HPI       The patient complains of shortness of breath with activity.  The patient denies shortness of breath at rest, productive cough, non-productive cough, coughing up blood, chest pain, irregular heartbeats, acid heartburn, indigestion, loss of appetite, weight change, abdominal pain, difficulty swallowing, sore throat, tooth/dental problems, headaches, nasal congestion/difficulty breathing through nose, sneezing, itching, rash, change in color of mucus, and fever.    Vital Signs:  Patient profile:   61 year old female Height:      66 inches Weight:      289 pounds BMI:     46.81 O2 Sat:      97 % on Room air Pulse rate:   92 / minute BP sitting:   134 / 72  (left arm) Cuff size:   regular  Vitals Entered By: Reynaldo Minium CMA (October 13, 2010 4:03 PM)  O2 Flow:  Room air CC: 6 month follow up visit -OSA   Physical Exam  Additional Exam:  General: A/Ox3; pleasant and cooperative, NAD, morbidly obese, very talkative SKIN: no rash, lesions NODES: no lymphadenopathy HEENT: [place]/AT, EOM- WNL, Conjuctivae- clear, PERRLA, TM-WNL, Nose- clear, Throat- clear and wnl, Mallampati  III, intermittently hoarse.  NECK: Supple w/ fair ROM, JVD- none, normal carotid impulses w/o bruits Thyroid-  CHEST: Clear to P&A- no rales, dullness or wheeze HEART: RRR, no m/g/r heard ABDOMEN; morbid obesity MVH:QION, nl pulses, very heavy legs with edema component NEURO: Grossly intact to observation      Impression & Recommendations:  Problem #  1:  OBSTRUCTIVE SLEEP APNEA (ICD-327.23)  Good compliance and control on CPAP at 10.We discussed options, but I don't think change in settings would make her more comfortable now. She is noting some hoarseness and occasional choking with food. She has been advised to see an ENT and I explained that that's who would need to visualize her cords.   Problem # 2:  DYSPNEA (ICD-786.05) Not describing acute change. she is so limited by her weight and obesity hypoventilation as well as her heart status, that I don't see any margin to attibute separately to an additional diagnosis.  Medications Added to Medication List This Visit: 1)  Byetta 10 Mcg Pen 10 Mcg/0.35ml Soln (Exenatide) .... Inject 10 units two times a day 2)  Cozaar 50 Mg Tabs (Losartan potassium) .... Take 1 by mouth once daily 3)  Corodone 300mg   .... Take 1 by mouth once daily  Other Orders: Est. Patient Level III (62952) Flu Vaccine 55yrs + MEDICARE PATIENTS (W4132) Administration Flu vaccine - MCR (G4010)  Patient Instructions: 1)  Please schedule a follow-up appointment in 1 year. 2)  Please call if I can help  3)  Flu vax    Flu Vaccine Consent Questions     Do you have a history of severe allergic reactions to this vaccine? no    Any prior history of allergic reactions to egg and/or gelatin? no    Do you have a sensitivity to the preservative Thimersol? no    Do you have a past history of Guillan-Barre Syndrome? no    Do you currently have an acute febrile illness? no    Have you ever had a severe reaction to latex? no    Vaccine information given and explained to patient? yes    Are you currently pregnant? no    Lot Number:[id number]   Exp Date:06/30/2011   Site Given  Left Deltoid IMedflu Reynaldo Minium CMA  October 13, 2010 5:18 PM

## 2011-02-01 NOTE — Letter (Signed)
Summary: Device-Delinquent Check  [facility], Main Office  [address]   [place], [place] [address]   Phone: [phone]  Fax: [phone]     July 31, 2010 MRN: [id number]   [name] [address] [place], [place]  [address]   Dear Ms. Eagon,  According to our records, you have not had your implanted device checked in the recommended period of time.  We are unable to determine appropriate device function without checking your device on a regular basis.  Please call our office to schedule an appointment as soon as possible.  If you are having your device checked by another physician, please call us so that we may update our records.  Thank you,   [facility]

## 2011-02-01 NOTE — Letter (Signed)
Summary: [facility]  [facility]   Imported By: Lennie Odor 01/17/2011 15:09:58  _____________________________________________________________________  External Attachment:    Type:   Image     Comment:   External Document

## 2011-02-07 NOTE — Letter (Signed)
Summary: [facility]  [facility]   Imported By: Sherian Rein 02/02/2011 12:07:24  _____________________________________________________________________  External Attachment:    Type:   Image     Comment:   External Document

## 2011-02-08 ENCOUNTER — Other Ambulatory Visit (HOSPITAL_COMMUNITY): Payer: Self-pay | Admitting: Oncology

## 2011-02-08 DIAGNOSIS — D472 Monoclonal gammopathy: Secondary | ICD-10-CM

## 2011-02-12 ENCOUNTER — Other Ambulatory Visit (HOSPITAL_COMMUNITY): Payer: Self-pay | Admitting: Oncology

## 2011-02-12 ENCOUNTER — Other Ambulatory Visit (HOSPITAL_COMMUNITY): Payer: MEDICARE

## 2011-02-12 ENCOUNTER — Encounter (HOSPITAL_COMMUNITY): Payer: Medicare Other | Attending: Oncology

## 2011-02-12 DIAGNOSIS — E119 Type 2 diabetes mellitus without complications: Secondary | ICD-10-CM | POA: Insufficient documentation

## 2011-02-12 DIAGNOSIS — I509 Heart failure, unspecified: Secondary | ICD-10-CM | POA: Insufficient documentation

## 2011-02-12 DIAGNOSIS — D472 Monoclonal gammopathy: Secondary | ICD-10-CM

## 2011-02-12 DIAGNOSIS — Z79899 Other long term (current) drug therapy: Secondary | ICD-10-CM | POA: Insufficient documentation

## 2011-02-12 LAB — CBC
Hemoglobin: 11.7 g/dL — ABNORMAL LOW (ref 12.0–15.0)
MCH: 28.5 pg (ref 26.0–34.0)
MCV: 89 fL (ref 78.0–100.0)
Platelets: 179 10*3/uL (ref 150–400)
RBC: 4.1 MIL/uL (ref 3.87–5.11)
WBC: 5.4 10*3/uL (ref 4.0–10.5)

## 2011-02-12 LAB — COMPREHENSIVE METABOLIC PANEL
AST: 15 U/L (ref 0–37)
Albumin: 3.5 g/dL (ref 3.5–5.2)
Alkaline Phosphatase: 61 U/L (ref 39–117)
BUN: 21 mg/dL (ref 6–23)
CO2: 31 mEq/L (ref 19–32)
Chloride: 97 mEq/L (ref 96–112)
Creatinine, Ser: 1.28 mg/dL — ABNORMAL HIGH (ref 0.4–1.2)
GFR calc Af Amer: 51 mL/min — ABNORMAL LOW (ref >60)
GFR calc non Af Amer: 42 mL/min — ABNORMAL LOW (ref >60)
Potassium: 4.3 mEq/L (ref 3.5–5.1)
Total Bilirubin: 0.7 mg/dL (ref 0.3–1.2)

## 2011-02-12 LAB — CREATININE CLEARANCE, URINE, 24 HOUR
Collection Interval-CRCL: 24 hours
Creatinine Clearance: 20 mL/min — ABNORMAL LOW (ref 75–115)
Creatinine, 24H Ur: 366 mg/d — ABNORMAL LOW (ref 700–1800)
Creatinine, Urine: 14.07 mg/dL

## 2011-02-13 LAB — KAPPA/LAMBDA LIGHT CHAINS: Kappa free light chain: 4.01 mg/dL — ABNORMAL HIGH (ref 0.33–1.94)

## 2011-02-14 LAB — PROTEIN ELECTROPHORESIS, SERUM
Albumin ELP: 50.5 % — ABNORMAL LOW (ref 55.8–66.1)
Alpha-1-Globulin: 5.8 % — ABNORMAL HIGH (ref 2.9–4.9)
Alpha-2-Globulin: 10.4 % (ref 7.1–11.8)
Beta 2: 13 % — ABNORMAL HIGH (ref 3.2–6.5)
Beta Globulin: 8.2 % — ABNORMAL HIGH (ref 4.7–7.2)
Total Protein ELP: 6.8 g/dL (ref 6.0–8.3)

## 2011-02-14 LAB — IMMUNOFIXATION ELECTROPHORESIS
IgA: 793 mg/dL — ABNORMAL HIGH (ref 68–378)
IgG (Immunoglobin G), Serum: 981 mg/dL (ref 694–1618)
IgM, Serum: 71 mg/dL (ref 60–263)

## 2011-02-15 ENCOUNTER — Ambulatory Visit (HOSPITAL_COMMUNITY): Payer: Medicare Other

## 2011-02-15 ENCOUNTER — Ambulatory Visit (HOSPITAL_COMMUNITY)
Admission: RE | Admit: 2011-02-15 | Discharge: 2011-02-15 | Disposition: A | Discharge status: Home / Self Care | Payer: Medicare Other | Source: Ambulatory Visit | Attending: Oncology | Admitting: Oncology

## 2011-02-15 ENCOUNTER — Other Ambulatory Visit: Payer: Self-pay | Admitting: Interventional Radiology

## 2011-02-15 ENCOUNTER — Other Ambulatory Visit: Payer: Self-pay

## 2011-02-15 DIAGNOSIS — I4892 Unspecified atrial flutter: Secondary | ICD-10-CM | POA: Insufficient documentation

## 2011-02-15 DIAGNOSIS — E669 Obesity, unspecified: Secondary | ICD-10-CM | POA: Insufficient documentation

## 2011-02-15 DIAGNOSIS — I509 Heart failure, unspecified: Secondary | ICD-10-CM | POA: Insufficient documentation

## 2011-02-15 DIAGNOSIS — I4891 Unspecified atrial fibrillation: Secondary | ICD-10-CM | POA: Insufficient documentation

## 2011-02-15 DIAGNOSIS — E119 Type 2 diabetes mellitus without complications: Secondary | ICD-10-CM | POA: Insufficient documentation

## 2011-02-15 DIAGNOSIS — IMO0001 Reserved for inherently not codable concepts without codable children: Secondary | ICD-10-CM | POA: Insufficient documentation

## 2011-02-15 DIAGNOSIS — G4733 Obstructive sleep apnea (adult) (pediatric): Secondary | ICD-10-CM | POA: Insufficient documentation

## 2011-02-15 DIAGNOSIS — I2789 Other specified pulmonary heart diseases: Secondary | ICD-10-CM | POA: Insufficient documentation

## 2011-02-15 DIAGNOSIS — Z950 Presence of cardiac pacemaker: Secondary | ICD-10-CM | POA: Insufficient documentation

## 2011-02-15 DIAGNOSIS — D472 Monoclonal gammopathy: Secondary | ICD-10-CM | POA: Insufficient documentation

## 2011-02-15 LAB — CBC
MCH: 29.7 pg (ref 26.0–34.0)
MCV: 90.4 fL (ref 78.0–100.0)
Platelets: 183 10*3/uL (ref 150–400)
RDW: 17 % — ABNORMAL HIGH (ref 11.5–15.5)
WBC: 7 10*3/uL (ref 4.0–10.5)

## 2011-02-15 LAB — GLUCOSE, CAPILLARY: Glucose-Capillary: 173 mg/dL — ABNORMAL HIGH (ref 70–99)

## 2011-02-20 ENCOUNTER — Encounter: Payer: Self-pay | Admitting: Internal Medicine

## 2011-02-20 ENCOUNTER — Ambulatory Visit (INDEPENDENT_AMBULATORY_CARE_PROVIDER_SITE_OTHER): Payer: MEDICARE | Admitting: "Endocrinology

## 2011-02-20 DIAGNOSIS — R5381 Other malaise: Secondary | ICD-10-CM

## 2011-02-20 DIAGNOSIS — E063 Autoimmune thyroiditis: Secondary | ICD-10-CM

## 2011-02-20 DIAGNOSIS — E038 Other specified hypothyroidism: Secondary | ICD-10-CM

## 2011-02-20 DIAGNOSIS — R233 Spontaneous ecchymoses: Secondary | ICD-10-CM

## 2011-02-20 DIAGNOSIS — I10 Essential (primary) hypertension: Secondary | ICD-10-CM

## 2011-02-26 ENCOUNTER — Observation Stay (HOSPITAL_COMMUNITY)
Admission: RE | Admit: 2011-02-26 | Discharge: 2011-02-27 | DRG: 259 | Disposition: A | Discharge status: Home / Self Care | Payer: Medicare Other | Source: Ambulatory Visit | Attending: Cardiovascular Disease | Admitting: Cardiovascular Disease

## 2011-02-26 DIAGNOSIS — I279 Pulmonary heart disease, unspecified: Secondary | ICD-10-CM | POA: Diagnosis present

## 2011-02-26 DIAGNOSIS — N644 Mastodynia: Secondary | ICD-10-CM | POA: Diagnosis present

## 2011-02-26 DIAGNOSIS — Z950 Presence of cardiac pacemaker: Secondary | ICD-10-CM

## 2011-02-26 DIAGNOSIS — Y831 Surgical operation with implant of artificial internal device as the cause of abnormal reaction of the patient, or of later complication, without mention of misadventure at the time of the procedure: Secondary | ICD-10-CM | POA: Diagnosis present

## 2011-02-26 DIAGNOSIS — I059 Rheumatic mitral valve disease, unspecified: Secondary | ICD-10-CM | POA: Diagnosis present

## 2011-02-26 DIAGNOSIS — T82897A Other specified complication of cardiac prosthetic devices, implants and grafts, initial encounter: Principal | ICD-10-CM | POA: Diagnosis present

## 2011-02-26 HISTORY — DX: Presence of cardiac pacemaker: Z95.0

## 2011-02-26 HISTORY — PX: PACEMAKER PLACEMENT: SHX43

## 2011-02-26 LAB — GLUCOSE, CAPILLARY: Glucose-Capillary: 144 mg/dL — ABNORMAL HIGH (ref 70–99)

## 2011-02-26 LAB — SURGICAL PCR SCREEN
MRSA, PCR: NEGATIVE
Staphylococcus aureus: NEGATIVE

## 2011-02-27 ENCOUNTER — Inpatient Hospital Stay (HOSPITAL_COMMUNITY): Payer: Medicare Other

## 2011-02-27 LAB — PROTIME-INR: INR: 1.13 (ref 0.00–1.49)

## 2011-02-28 ENCOUNTER — Ambulatory Visit (HOSPITAL_COMMUNITY): Payer: Self-pay | Admitting: Oncology

## 2011-02-28 ENCOUNTER — Ambulatory Visit (HOSPITAL_COMMUNITY): Payer: Medicare Other | Admitting: Oncology

## 2011-02-28 DIAGNOSIS — D472 Monoclonal gammopathy: Secondary | ICD-10-CM

## 2011-02-28 LAB — GLUCOSE, CAPILLARY: Glucose-Capillary: 188 mg/dL — ABNORMAL HIGH (ref 70–99)

## 2011-03-02 ENCOUNTER — Ambulatory Visit: Payer: Self-pay | Admitting: Internal Medicine

## 2011-03-02 NOTE — Op Note (Signed)
NAMEMARIKA, [name]             ACCOUNT NO.:  [id number]  MEDICAL RECORD NO.:  [id number]           PATIENT TYPE:  I  LOCATION:  2011                         FACILITY:  [facility]  PHYSICIAN:  Thurmon Fair, MD     DATE OF BIRTH:  [birth year]  DATE OF PROCEDURE:  02/26/2011 DATE OF DISCHARGE:                              OPERATIVE REPORT   PROCEDURES PERFORMED: 1. Permanent pacemaker generator change out. 2. Extensive pocket revision (previous device placed subpectorally     with extensive migration). 3. Fluoroscopy. 4. An AIGIS mesh envelope was used to prevent migration and reduce     risk of infection  PREOPERATIVE DIAGNOSES: 1. 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 2. Obesity. 3. Extensive device of migration, symptomatic with pain in the left     breast.  The explanted device was a Medtronic EnRhythm P1501 DR, serial number [id number].  Date of insertion, March 02, 2005.  The new generator is a Medtronic Adapta [id number], serial number [id number].  The chronic leads were kept.  The right atrial lead is a St. Jude 1388TC, serial number [id number].  Inserted July 18, 1998.  Right ventricular lead is St. Jude [id number], serial number [id number], inserted likewise July 18, 1998.  MEDICATIONS ADMINISTERED:  Lidocaine 1% 40 mL locally, vancomycin 1 g intravenously, Versed and fentanyl for conscious sedation.  PROCEDURE PERFORMER:  Thurmon Fair, MD  ASSISTANT:  Oliver Hum, RN  COMPLICATIONS:  None.  ESTIMATED BLOOD LOSS:  Less than 25 mL.  [name] with multiple previous pacemaker procedures dating back to 1999.  She has had 3 previous generator change outs or pocket revisions.  Most recently, the device appears to have been placed in a subpectoral location, but despite this, show an extensive migration.  The device is painful.  It appeared that the device was placed  subpectorally due to concerns regarding skin/adipose tissue atrophy or possibly due to previous device migration.  After risks and benefits of the procedure were described, the patient provided informed consent and was prepped and draped in usual sterile fashion.  The right shoulder area received anesthesia using 1% lidocaine.  A 6-cm horizontal incision was made at the level of previous scar tissue.  Very careful blunt dissection and electrocautery was used to reach the level of the chronic leads and chronic device.  The device was found to be a subpectoral location, but was explanted with some difficulty.  Due to the patient's complaints of pain since this device was inserted, decision was made to put the device in a new subcutaneous location and to prevent future migration using an AIGIS pouch.  The pocket was flushed with copious amounts of antibiotic solution and 2 loose stitches were placed at the level of the old subpectoral pocket.  The new pocket was created medially to the previous subcutaneous location.  The leads were had been carefully freed from the chronic scar tissue.  The pocket was flushed with copious amounts of antibiotic solution and found to have good hemostasis.  The new device was placed in an AIGIS pouch.  The knots  of which was loosely closed with a single silk stitch.  The pouch was placed into the subcutaneous pocket with great care being taken now to achieve a comfortable position to avoid excessive pressure.  The pouch was secured to the pectoral fascia with 2 silk stitches.  The pocket was then closed in layers using 2 layers of 2- 0 Vicryl and cutaneous staples.  At the end of the procedure, the right atrial and right ventricular lead electronic parameters were identical with those was proceeding the procedure.  There was no underlying rhythm to sense.  The atrial lead impedance was 322 ohms.  The right ventricle lead impedance was 333 ohms.  The right  atrial threshold was 1.5 volts at 5 milliseconds of pulse width.  The right ventricular threshold was 1.6 volts at 0.5 milliseconds of pulse width.  The device was programmed in the original fashion to DDIR mode with a lower rate limit of 70 and an upper tracking rate of 100 beats per minute.  Due to the extensive revision process, decision was made not to restart warfarin at this time.  The patient will have to eventually resume warfarin due to history of paroxysmal atrial flutter and paroxysmal atrial fibrillation.     Thurmon Fair, MD     MC/MEDQ  D:  02/26/2011  T:  02/27/2011  Job:  [id number]  cc:   [name], M.D.  Electronically Signed by Thurmon Fair M.D. on 03/02/2011 12:51:14 PM

## 2011-03-09 NOTE — Discharge Summary (Signed)
NAMEGRETCHEN, [name]             ACCOUNT NO.:  [id number]  MEDICAL RECORD NO.:  [id number]           PATIENT TYPE:  I  LOCATION:  2011                         FACILITY:  [facility]  PHYSICIAN:  [place] Khamari Sheehan, MD         DATE OF BIRTH:  30-Nov-1950  DATE OF ADMISSION:  02/26/2011 DATE OF DISCHARGE:  02/27/2011                              DISCHARGE SUMMARY   DISCHARGE DIAGNOSES: 1. Permanent pacemaker generator change out.  The explanted device was     a Medtronic EnRhythm [id number], new generator is Medtronic Adapta     ADDRL1, serial number is [id number]. 2. History of atrial flutter, currently on amiodarone and warfarin. 3. Morbid obesity. 4. Moderate pulmonary hypertension. 5. 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  HOSPITAL COURSE:  [name] is a 61 year old patient of Dr. Fredirick Maudlin with a history of atrial flutter, has required cardioversion in the past.  She is currently on amiodarone and warfarin for this.  She is morbidly obese and has additional history of moderate pulmonary hypertension, mild-to- moderate depressed left ventricular systolic and mitral insufficiency. She presents for elective generator change out with a pacemaker due to being close to elective placement interval.  The device was implanted after explant of the previous device.  Due to excessive migration of the previous device to the office at that revision was completed.  Due to extensive migration of the previous device, pocket revision was completed.  An AIGIS mesh envelope was used to prevent migration and reduce the risk of infection and also decided due to extensive revision process not to restart her Coumadin at this time and will we will do so in the future.  As of February 27, 2011, the patient complains of little bit of soreness.  Other than that, she feels well.  She has been seen by Dr. Rennis Golden who feels she is stable for discharge after x-ray  confirms no hemothorax.  Two-view chest x-ray reveals no active lung disease and the permanent pacemaker present.  There was no pneumothorax seen.     Marland Kitchen  DISCHARGE LABORATORY DATA:  INR was 1.13.  PT was 14.7.  STUDIES/PROCEDURES: 1. Operative report with pacemaker generator change out, extensive     pocket revision.  Previous device was placed subpectorally with     extensive migration.  An AIGIS mesh envelope was used to prevent     migration and reduce the risk of infection.  Explanted  device was     a Medtronic EnRhythm [id number], serial number [id number].  This     device was inserted on March 02, 2005.  New generator was a     Medtronic Adapta ADDRL1, serial number [id number].  Medtronic leads     were kept. 2. Chest x-ray on February 27, 2011, no active lung disease, permanent     pacemaker present.  No pneumothorax seen.  Lungs re clear.  Heart     size is stable.  DISCHARGE MEDICATIONS: 1. Ambien 10 mg 1 tablet by mouth daily at bedtime. 2. Amiodarone 200 mg 1-1/2 tablets by mouth daily. 3. Calcitriol  0.25 mcg 1 capsule by twice daily. 4. Critical over-the-counter 1 tablet by mouth before meals and at     bedtime. 5. Cymbalta 60 mg 1 capsule by mouth daily at bedtime. 6. Demadex 100 mg one-half tablet by mouth daily. 7. Digoxin 0.25 mg 1 tablet by mouth daily. 8. Glyburide 5 mg 3 tablets by mouth twice daily. 9. Hydrocodone/APAP 10/650 mg 1 tablet by mouth every 6 hours as     needed for pain. 10.Januvia 100 mg 1 tablet by mouth daily. 11.Klor-Con 20 mEq 1 tablet by mouth daily. 12.Lopressor 25 mg 1 tablet by mouth twice daily. 13.Multivitamin therapeutic 1 tablet by mouth daily. 14.Nitroglycerine patch 0.4 mg per hour one patch transdermally every     other day. 15.Nitroglycerin sublingual 0.4 mg tablets, 1 tablet under the tongue     every 5 minutes up to three doses total for chest pain. 16.ProAir inhaler 90 mcg 1 puff inhaled daily as needed for shortness     of  breath. 17.Synthroid 2-1/2 tablets by mouth daily, 25 mcg. 18.Vitamin D2 50,000 units 1 capsule by mouth twice a week.  DISPOSITION:  [name] will be discharged home in stable condition. She is to increase her activity slowly.  She must keep the incision site dry for approximately 1 week.  If that incision site becomes red, painful, swollen, or discharges pus or fluid, she is to call our office. She will follow up with Dr. Royann Shivers on Tuesday, March 06, 2011, at 1:45 a.m. for a wound site check, and she also has an appointment with Dr. Clarene Duke prior to that I believe.  The patient must keep arm movements below the shoulder as instructed previously.    ______________________________ Wilburt Finlay, PA   ______________________________ [place] Jamond Neels, MD    BH/MEDQ  D:  02/27/2011  T:  02/28/2011  Job:  [id number]  cc:   Thurmon Fair, MD  Electronically Signed by Wilburt Finlay PA on 02/28/2011 84:69:62 PM Electronically Signed by Kirtland Bouchard. Jarad Barth M.D. on 03/08/2011 08:01:23 AM

## 2011-03-12 LAB — GLUCOSE, CAPILLARY
Glucose-Capillary: 169 mg/dL — ABNORMAL HIGH (ref 70–99)
Glucose-Capillary: 177 mg/dL — ABNORMAL HIGH (ref 70–99)
Glucose-Capillary: 189 mg/dL — ABNORMAL HIGH (ref 70–99)
Glucose-Capillary: 191 mg/dL — ABNORMAL HIGH (ref 70–99)

## 2011-03-12 LAB — BASIC METABOLIC PANEL
CO2: 33 mEq/L — ABNORMAL HIGH (ref 19–32)
Calcium: 8.6 mg/dL (ref 8.4–10.5)
Creatinine, Ser: 1.53 mg/dL — ABNORMAL HIGH (ref 0.4–1.2)
GFR calc Af Amer: 42 mL/min — ABNORMAL LOW (ref >60)
GFR calc non Af Amer: 34 mL/min — ABNORMAL LOW (ref >60)
Glucose, Bld: 206 mg/dL — ABNORMAL HIGH (ref 70–99)
Sodium: 137 mEq/L (ref 135–145)

## 2011-03-12 LAB — PROTIME-INR: INR: 2.5 — ABNORMAL HIGH (ref 0.00–1.49)

## 2011-03-13 LAB — PROTIME-INR
INR: 2.43 — ABNORMAL HIGH (ref 0.00–1.49)
INR: 2.46 — ABNORMAL HIGH (ref 0.00–1.49)
Prothrombin Time: 26.5 seconds — ABNORMAL HIGH (ref 11.6–15.2)
Prothrombin Time: 26.8 seconds — ABNORMAL HIGH (ref 11.6–15.2)

## 2011-03-13 LAB — BASIC METABOLIC PANEL
Calcium: 8.6 mg/dL (ref 8.4–10.5)
Calcium: 8.9 mg/dL (ref 8.4–10.5)
GFR calc Af Amer: 55 mL/min — ABNORMAL LOW (ref >60)
GFR calc Af Amer: 60 mL/min — ABNORMAL LOW (ref >60)
GFR calc non Af Amer: 45 mL/min — ABNORMAL LOW (ref >60)
GFR calc non Af Amer: 49 mL/min — ABNORMAL LOW (ref >60)
Glucose, Bld: 150 mg/dL — ABNORMAL HIGH (ref 70–99)
Glucose, Bld: 173 mg/dL — ABNORMAL HIGH (ref 70–99)
Potassium: 3.6 mEq/L (ref 3.5–5.1)
Potassium: 3.8 mEq/L (ref 3.5–5.1)
Sodium: 141 mEq/L (ref 135–145)
Sodium: 141 mEq/L (ref 135–145)

## 2011-03-13 LAB — APTT: aPTT: 42 seconds — ABNORMAL HIGH (ref 24–37)

## 2011-03-13 LAB — URINALYSIS, ROUTINE W REFLEX MICROSCOPIC
Glucose, UA: NEGATIVE mg/dL
Hgb urine dipstick: NEGATIVE
Ketones, ur: NEGATIVE mg/dL
Protein, ur: NEGATIVE mg/dL
Urobilinogen, UA: 0.2 mg/dL (ref 0.0–1.0)

## 2011-03-13 LAB — URINE MICROSCOPIC-ADD ON

## 2011-03-13 LAB — MAGNESIUM: Magnesium: 1.9 mg/dL (ref 1.5–2.5)

## 2011-03-13 LAB — CARDIAC PANEL(CRET KIN+CKTOT+MB+TROPI)
Relative Index: INVALID (ref 0.0–2.5)
Total CK: 29 U/L (ref 7–177)

## 2011-03-13 LAB — GLUCOSE, CAPILLARY
Glucose-Capillary: 167 mg/dL — ABNORMAL HIGH (ref 70–99)
Glucose-Capillary: 229 mg/dL — ABNORMAL HIGH (ref 70–99)

## 2011-03-13 LAB — HEMOGLOBIN A1C: Mean Plasma Glucose: 169 mg/dL — ABNORMAL HIGH (ref <117)

## 2011-03-15 LAB — UIFE/LIGHT CHAINS/TP QN, 24-HR UR
Alpha 1, Urine: DETECTED — AB
Alpha 2, Urine: DETECTED — AB
Beta, Urine: DETECTED — AB
Free Kappa Lt Chains,Ur: 9.65 mg/dL — ABNORMAL HIGH (ref 0.04–1.51)
Free Lt Chn Excr Rate: 279.85 mg/d
Gamma Globulin, Urine: DETECTED — AB

## 2011-03-15 LAB — IMMUNOFIXATION, URINE

## 2011-03-16 LAB — DIFFERENTIAL
Basophils Absolute: 0 10*3/uL (ref 0.0–0.1)
Basophils Relative: 1 % (ref 0–1)
Eosinophils Relative: 1 % (ref 0–5)
Monocytes Absolute: 0.3 10*3/uL (ref 0.1–1.0)
Neutro Abs: 5.6 10*3/uL (ref 1.7–7.7)

## 2011-03-16 LAB — CBC
Hemoglobin: 13.3 g/dL (ref 12.0–15.0)
MCV: 89.1 fL (ref 78.0–100.0)
Platelets: 198 10*3/uL (ref 150–400)
RBC: 4.49 MIL/uL (ref 3.87–5.11)
WBC: 7.4 10*3/uL (ref 4.0–10.5)

## 2011-03-16 LAB — PROTEIN ELECTROPH W RFLX QUANT IMMUNOGLOBULINS
Beta 2: 11.4 % — ABNORMAL HIGH (ref 3.2–6.5)
Gamma Globulin: 12.1 % (ref 11.1–18.8)
M-Spike, %: 0.5 g/dL

## 2011-03-16 LAB — COMPREHENSIVE METABOLIC PANEL
AST: 19 U/L (ref 0–37)
Albumin: 3.9 g/dL (ref 3.5–5.2)
Alkaline Phosphatase: 67 U/L (ref 39–117)
BUN: 21 mg/dL (ref 6–23)
CO2: 31 mEq/L (ref 19–32)
Chloride: 97 mEq/L (ref 96–112)
GFR calc non Af Amer: 40 mL/min — ABNORMAL LOW (ref >60)
Potassium: 4.3 mEq/L (ref 3.5–5.1)
Total Bilirubin: 1.4 mg/dL — ABNORMAL HIGH (ref 0.3–1.2)

## 2011-03-16 LAB — IMMUNOFIXATION ELECTROPHORESIS
IgG (Immunoglobin G), Serum: 1020 mg/dL (ref 694–1618)
Total Protein ELP: 7.4 g/dL (ref 6.0–8.3)

## 2011-03-16 LAB — IGG, IGA, IGM
IgG (Immunoglobin G), Serum: 1040 mg/dL (ref 694–1618)
IgM, Serum: 76 mg/dL (ref 60–263)

## 2011-03-16 LAB — IMMUNOFIXATION ADD-ON

## 2011-03-20 NOTE — Letter (Signed)
Summary: [facility]   Imported By: Sherian Rein 03/13/2011 08:58:38  _____________________________________________________________________  External Attachment:    Type:   Image     Comment:   External Document

## 2011-04-03 ENCOUNTER — Ambulatory Visit: Payer: Self-pay | Admitting: Internal Medicine

## 2011-04-08 LAB — CBC
HCT: 36.2 % (ref 36.0–46.0)
Hemoglobin: 11.8 g/dL — ABNORMAL LOW (ref 12.0–15.0)
MCHC: 32.7 g/dL (ref 30.0–36.0)
RDW: 16.5 % — ABNORMAL HIGH (ref 11.5–15.5)

## 2011-04-08 LAB — GLUCOSE, CAPILLARY

## 2011-04-10 LAB — PROTIME-INR
INR: 1.5 (ref 0.00–1.49)
Prothrombin Time: 18.5 seconds — ABNORMAL HIGH (ref 11.6–15.2)

## 2011-04-10 LAB — GLUCOSE, CAPILLARY: Glucose-Capillary: 125 mg/dL — ABNORMAL HIGH (ref 70–99)

## 2011-04-18 ENCOUNTER — Encounter: Payer: Self-pay | Admitting: Internal Medicine

## 2011-04-20 ENCOUNTER — Ambulatory Visit: Payer: Self-pay | Admitting: Internal Medicine

## 2011-04-24 ENCOUNTER — Other Ambulatory Visit: Payer: Self-pay | Admitting: *Deleted

## 2011-05-09 ENCOUNTER — Telehealth: Payer: Self-pay

## 2011-05-09 NOTE — Telephone Encounter (Signed)
Refer to Urgent Care today as doctor is not in the office. Burn is now 18 days old and pt. Has multiple medical problems. Nurse says pt. Refused to go to ER.

## 2011-05-15 ENCOUNTER — Other Ambulatory Visit: Payer: Self-pay | Admitting: Internal Medicine

## 2011-05-15 NOTE — Op Note (Signed)
NAMEIOANNA, COLQUHOUN             ACCOUNT NO.:  [id number]   MEDICAL RECORD NO.:  [id number]          PATIENT TYPE:  OIB   LOCATION:  5003                         FACILITY:  [facility]   PHYSICIAN:  Nadara Mustard, MD     DATE OF BIRTH:  Jan 17, 1950   DATE OF PROCEDURE:  07/01/2008  DATE OF DISCHARGE:                               OPERATIVE REPORT   PREOPERATIVE DIAGNOSIS:  Osteoarthritis, right knee, with mechanical  symptoms.   POSTOPERATIVE DIAGNOSES:  1. Loose bodies x3, greater than 10 mm in diameter.  2. Medial and lateral meniscal tears.  3. Osteochondral defect, medial femoral condyle, lateral femoral      condyle, patella, and trochlea.   PROCEDURES:  1. Removal of 3 loose bodies greater than 10 cm in diameter.  2. Partial medial and lateral meniscectomy.  3. Abrasion chondroplasty, back to bleeding, viable subchondral bone      to the medial femoral condyle, lateral femoral condyle, patella,      and trochlea.   SURGEON:  Nadara Mustard, MD   ANESTHESIA:  General.   ESTIMATED BLOOD LOSS:  Minimal.   ANTIBIOTICS:  1 g of vancomycin.   DRAINS:  None.   COMPLICATIONS:  None.   DISPOSITION:  To PACU in stable condition.   INDICATIONS FOR PROCEDURE:  The patient is a 61 year old woman with  significant osteoarthritis of her right knee.  She has developed  recurrent mechanical symptoms with catching, locking, and giving way  after a recent fall.  She has failed conservative care.  She has  previously undergone arthroscopic intervention about a year and a half  ago and presents at this time for arthroscopic intervention.  Risks and  benefits were discussed including infection, neurovascular injury,  persistent pain, and need for additional surgery.  The patient states  she understands and wished to proceed at this time.  The patient is  currently on Coumadin.   DESCRIPTION OF PROCEDURE:  The patient was brought to the OR #1 and  underwent a general anesthetic.  After  adequate level of anesthesia  obtained, the patient's right lower extremity was prepped using DuraPrep  and draped into a sterile field.  The scope was inserted to the inferior  lateral portal and inferior-medial working portal was established.  Visualization showed several loose bodies in the medial joint line  greater than 10 mm in diameter.  Using the shaver, these were removed.  The patient had large osteochondral defect of the medial femoral  condyle, and using a shaver, this was debrided back to bleeding, viable  subchondral bone.  The posteromedial horn of the medial meniscus also  had degenerative tearing and this was debrided with a shaver.  The vapor  wand was used for hemostasis as well as to smooth out the meniscus.  The  tibia plateau medially had some osteochondral changes as well and this  was also debrided with a shaver.  Examination of notch showed an intact  ACL and PCL.  Examination of the lateral joint line showed a large  osteochondral defect of the lateral femoral condyle as well as a  loose  body greater than 10 mm in diameter.  The loose body was removed.  The  lateral femoral condyle underwent abrasion chondroplasty with the shaver  back to bleeding, viable subchondral bone.  Examination of the  patellofemoral joint also showed degenerative changes in the  patellofemoral joint area, and using the shaver, the patient underwent  abrasion chondroplasty back to bleeding, viable subchondral bone of the  patellofemoral joint.  There was significant amount of synovitis in the  patellofemoral joint area and the vapor wand was used for hemostasis.  Survey of all 3 compartments was again performed.  There were no loose  bodies.  The instruments were removed.  The portals were closed using 3-  0 nylon.  The portals were injected with a total 20 mL of 0.5% Marcaine  plain.  The wound was covered with Adaptic, orthopedic sponges, Webril,  and Coban.  The patient was extubated  and taken to the PACU in stable  condition.  Joint was infused with a total of 20 mL of 0.5% Marcaine  plain.  The patient had a BMI of greater than 50.  Plan is for a 24-hour  observation.  He will be using the CPAP machine.  Prescriptions for  Vicodin and Robaxin.  Follow up in the office in 3 weeks.      Nadara Mustard, MD  Electronically Signed     MVD/MEDQ  D:  07/01/2008  T:  07/02/2008  Job:  [id number]

## 2011-05-15 NOTE — Assessment & Plan Note (Signed)
[facility]                             PULMONARY OFFICE NOTE   NAME:[name], [name]                    MRN:          [id number]  DATE:05/19/2007                            DOB:          [birth year]    PROBLEM LIST:  1. Obstructive sleep apnea.  2. Hypertension.  3. Pulmonary hypertension.  4. Morbid obesity.  5. Pacemaker/cardiomyopathy,paroxysmal atrial fibrillation.  6. Diabetes.   HISTORY OF PRESENT ILLNESS:  She says breathing is okay except when she  feels she is retaining fluid, at which time she will start to wheeze, if  she is sleeping on 1 pillow. She continues her CPAP at 10 CWP. Chest x-  ray in November had shown stable cardiomegaly and pacemaker position  with mild basilar atelectasis. She does not really notice wheeze or  phlegm.   MEDICATIONS:  Her extensive list is charted and reviewed. Significant  for Spiriva, Allegra 60 mg, amiodarone, Lisinopril, and the CPAP.   OBJECTIVE:  VITAL SIGNS:  Weight is off our scale. Blood pressure  100/62, pulse 88, room air saturation 98%.  GENERAL:  She is chatty. In no distress.  LUNGS:  Fields sound clear.  HEART:  Sounds are regular at this time with no murmur heard.  EXTREMITIES:  Legs are very heavy with varices evident.   IMPRESSION:  1. Pulmonary and sleep apnea status seem stable with no changes to      offer.   PLAN:  Schedule return in 6 months, earlier p.r.n.     Clinton D. Maple Hudson, MD, Tonny Bollman, FACP  Electronically Signed    CDY/MedQ  DD: 05/26/2007  DT: 05/26/2007  Job #: [id number]   cc:   [name], M.D.  [name], M.D.  David Stall, M.D.

## 2011-05-15 NOTE — Discharge Summary (Signed)
[name], [name]             ACCOUNT NO.:  [id number]   MEDICAL RECORD NO.:  [id number]          PATIENT TYPE:  OIB   LOCATION:  2899                         FACILITY:  [facility]   PHYSICIAN:  Hillis Range, MD       DATE OF BIRTH:  [birth year]   DATE OF ADMISSION:  05/10/2009  DATE OF DISCHARGE:  05/10/2009                               DISCHARGE SUMMARY   She has multiple allergies which are PENICILLIN, CONTRAST MEDIA,  CLINDAMYCIN, PHENERGAN, BETADINE, FISH, MORPHINE, ULTRAM, BENADRYL, and  TAPE.   FINAL DIAGNOSES:  1. Permanent pacemaker revision, May 10, 2009.      a.     Exquisite pain at the pacemaker site.  Leads are       palpable/loss of subcutaneous tissue bed were pacemaker site.      b.     The patient has difficulty sleeping secondary to pain which       is marked at the pacemaker site when she lies on the left or right       side.  2. Pacemaker pocket revision, May 10, 2009.      a.     Repositioning of the pacemaker to a right submuscular site.  3. The patient had a recent interrogation of her pacemaker.  There      were no episodes of atrial dysrhythmia.  The patient was pacing AV      99% of the time.   SECONDARY DIAGNOSES:  1. Pacemaker implantation in 1999.      a.     Symptomatic bradycardia/tachybrady syndrome.  2. Pacemaker generator change, a Medtronic EnRhythm, March 02, 2005.      Pacemaker was programmed DDIR implanted by Dr. Jenne Campus.  3. Paroxysmal atrial fibrillation, chronic Coumadin.  4. Last catheterization on August 2008, ejection fraction 46%,      angiographically normal coronary anatomy with apical akinesis.  5. Dyslipidemia.  6. Lower extremity edema.  7. Morbid obesity.  8. Diabetes mellitus.  9. History of Hashimoto thyroiditis.      a.     Amiodarone stopped.  10.The patient placed on Multaq after amiodarone with no improvement      in symptoms of dysrhythmias.  11.Pulmonary hypertension.  12.Obstructive sleep apnea/CPAP.  13.Monoclonal gammopathy of undetermined significance.  14.Nonischemic cardiomyopathy, ejection fraction 35-45%,.  15.Fibromyalgia.  16.Status post cholecystectomy.  17.Status post right knee surgery x2.  18.History of hypothyroidism.  19.Relative hypotension.  20.History of left kidney abscess, status post resection.  21.History of ventral herniorrhaphy.   PROCEDURE:  May 10, 2009, explant of permanent pacemaker with a  Medtronic EnRhythm from its subcutaneous site and reimplantation to a  submuscular bed.  The patient at the short-stay center.  In the  postprocedure period, is experiencing no exquisite or even moderate  pain.  She was lucid and considered ready for discharge to the same day.   The patient was asked to restart her Coumadin on Friday, May 13, 2009 at  her regular home dose.  She was asked to keep her incision dry for next  7 days, to sponge bathe until Tuesday, May 17, 2009.  She was asked to  remove the bulky bandage in the morning of Wednesday, May 11, 2009 and  leave the incision open to the air.  She was asked not to lift anything  greater than 10 pounds for next 2 weeks.  She will discharge on the  following medications;  1. To restart Coumadin at home dose, Friday, May 13, 2009.  2. Spironolactone 25 mg twice daily.  3. Demadex 100 mg twice daily.  4. Klor-Con 20 mEq daily.  5. Digoxin 0.25 mg daily.  6. Synthroid 50 mcg daily.  7. Cymbalta 60 mg daily.  8. Spiriva inhaler daily.  9. Byetta 5 mcg twice daily.  10.Vitamin D 50,000 units weekly.  11.Crestor 10 mg daily at bedtime.  12.Calcitriol 0.25 mg daily.  13.Ambien 10 mg daily at bedtime.  14.Lopressor 25 mg 3 times daily.  I will conference with Dr. Johney Frame      with respect to antibiotic therapy for this patient.  Going      forward, this patient has had a history of staph infection in the      past.   LABORATORY STUDIES THIS ADMISSION:  White cells 7.4, hemoglobin 13.5,  hematocrit 39.2,  platelets are 176.  Sodium 141, potassium is 4,  chloride 102, carbonate 32, glucose 179, BUN is 36, creatinine 1.6,  protime 25.2, INR 2.5.  These studies were taken on May 02, 2009.      Maple Mirza, [place]      Hillis Range, MD  Electronically Signed    GM/MEDQ  D:  05/10/2009  T:  05/11/2009  Job:  [id number]   cc:   [name], M.D.  Clinton D. Maple Hudson, MD, FCCP, FACP  David Stall, M.D.  [name], M.D.

## 2011-05-15 NOTE — Assessment & Plan Note (Signed)
[facility]                             PULMONARY OFFICE NOTE   NAME:SIMMONSRayma, Hegg                    MRN:          [id number]  DATE:11/18/2007                            DOB:          Sep 04, 1950    PROBLEM LIST:  1. Obstructive sleep apnea.  2. Hypertension.  3. Pulmonary hypertension.  4. Morbid obesity.  5. Pacemaker cardiomyopathy/atrial fibrillation.  6. Diabetes.  7. Hashimoto thyroiditis.   HISTORY:  She is comfortable with her CPAP at 10 and that has not been  an issue.  She has not gotten her weight down.  She had a heart  catheterization.  She had a thyroid nodule biopsied.  She has not had  specific therapy for pulmonary hypertension but carries this diagnosis  and we will direct question of therapy to discuss with Dr. Clarene Duke.  On  Coreg, she says her ejection fraction is increased.  Feet will swell  mostly dependent.  She had her 3rd pneumococcal vaccine in 2008, but  wants a flu shot.   MEDICATIONS:  Charted and reviewed significant today for:  1. Ipratropium 0.06% nasal spray.  2. Spiriva.  3. Her CPAP.  4. Xopenex HFA rescue inhaler.  5. Home nebulizer with Xopenex 1.25 mg used p.r.n.  6. She is on amiodarone.   OBJECTIVE:  VITAL SIGNS:  Weight could not be recorded.  She is over the  limit of our scale.  BP 108/64, pulse 76, room air saturation 97%.  GENERAL:  Talkative and apparently comfortable, sitting quietly in the  exam room.  CHEST:  Very clear.  HEART:  Pulse feels regular and I do not hear a murmur.  NECK:  There is no neck vein distention, no stridor.  EXTREMITIES:  Legs are quite heavy, maybe trace edema, no cyanosis.   IMPRESSION:  1. Obstructive sleep apnea is controlled.  2. Exogenous obesity remains her biggest medical problem.  3. Her amiodarone needs to be tracked as to possible pulmonary      toxicity in the context of a complaint of dyspnea for which she had      her heart catheterization.   PLAN:  1. Schedule PFT.  2. Chest x-ray.  3. Flu vaccine.  4. Consider with Dr. Clarene Duke whether she needs any further therapy for      pulmonary hypertension.  I do not know if her recent      catheterization included right heart.  5. Schedule return 6 months, earlier p.r.n.     Clinton D. Maple Hudson, MD, Tonny Bollman, FACP  Electronically Signed    CDY/MedQ  DD: 11/22/2007  DT: 11/23/2007  Job #: [id number]   cc:   [name], M.D.  [name], M.D.  David Stall, M.D.

## 2011-05-15 NOTE — Discharge Summary (Signed)
[name], [name]             ACCOUNT NO.:  [id number]   MEDICAL RECORD NO.:  [id number]          PATIENT TYPE:  INP   LOCATION:  5706                         FACILITY:  [facility]   PHYSICIAN:  [name], M.D. DATE OF BIRTH:  1950/10/20   DATE OF ADMISSION:  08/25/2007  DATE OF DISCHARGE:  08/27/2007                               DISCHARGE SUMMARY   DISCHARGE DIAGNOSES:  1. Chest pain, no coronary disease by cath.  2. History of cardiomyopathy, EF improved to 46% at cath.  3. Atrial fibrillation, on Coumadin and amiodarone increased this      admission.  4. Morbid obesity.  5. PTVDP in the past for sick sinus syndrome and atrial fibrillation.  6. History of thyroid nodule, biopsy this admission.   HOSPITAL COURSE:  The patient is a 61 year old female who saw Dr. [name]  August 19, 2007 for chest pain.  Her symptoms were worrisome for angina.  She was admitted for diagnostic catheterization and further evaluation.  Catheterization done August 25, 2007 showed no coronary disease, no  renal artery stenosis and EF of 46% or possibly better.  Her EF had  improved from previous assessments. At one point there was some concern  she may need a pacer upgrade to a BiV ICD but this apparently will not  be the case.  The patient contacted her endocrinologist this admission  to follow up her thyroid nodule. She underwent biopsy on August 27, 2007.  We feel she is stable to be discharged later on the 27th.   DISCHARGE MEDICATIONS:  1. Amiodarone has been increased to 400 mg a day for 2 weeks and then      300 mg a day.  2. Lanoxin has been cut back to 0.25 mg a day.  3. Demadex Aldactone and potassium are being held until she gets a      follow-up BMP Thursday this week.  4. Coreg has been increased to 25 mg twice a day.  5. Lisinopril was at 5 mg a day.  6. Coumadin 5 mg a day or as directed, she will have a protime check      next week.  7. Glucophage is at 500 mg a day, she is  to resume this Thursday.  8. Synthroid 0.05 mg a day.  9. Cymbalta 16 mg a day.  10.Darvocet p.r.n.  11.Nitroglycerin p.r.n.  12.Spiriva p.r.n.  13.Crestor 10 mg a day.  14.Ambien 10 mg at h.s. p.r.n.  15.Allegra 60 mg a day.   LABORATORY DATA:  Sodium 139, potassium 4.3, BUN 36, creatinine 1.4,  white count 6.6, hemoglobin 11.2, hematocrit 33, platelets 145, INR 1.2.   DISPOSITION:  The patient discharged in stable condition.  She will  follow up with Dr. [name] as an outpatient.  She will need a protime  followed by the Coumadin clinic.      [name], P.A.    ______________________________  [name], M.D.    [name]  D:  08/27/2007  T:  08/28/2007  Job:  [id number]   cc:   [name]. [name], M.D.

## 2011-05-15 NOTE — Op Note (Signed)
[name], [name]             ACCOUNT NO.:  [id number]   MEDICAL RECORD NO.:  [id number]          PATIENT TYPE:  OIB   LOCATION:  2899                         FACILITY:  [facility]   PHYSICIAN:  Hillis Range, MD       DATE OF BIRTH:  03-03-1950   DATE OF PROCEDURE:  05/10/2009  DATE OF DISCHARGE:  05/10/2009                               OPERATIVE REPORT   PREPROCEDURE DIAGNOSES:  1. Symptomatic bradycardia.  2. Sinus node dysfunction.  3. Second-degree atrioventricular block.  4. Pacemaker site discomfort and irritation.   POSTPROCEDURE DIAGNOSES:  1. Symptomatic bradycardia.  2. Sinus node dysfunction.  3. Second-degree atrioventricular block.  4. Pacemaker site discomfort and irritation.   PROCEDURES:  1. Pacemaker pocket revision.  2. Pacemaker interrogation.   INTRODUCTION:  [name] is a 61 year old female with a history of  paroxysmal atrial fibrillation, symptomatic bradycardia with sinus node  dysfunction, and second-degree AV block who presents for management of  her painful pacemaker pocket.  She initially had her pacemaker implanted  in 1999 for sinus node dysfunction.  She then had a pulse generator  replaced by Dr. Jenne Campus in March 2006.  Since that time, she has had  extreme difficulty with pain over her pacemaker site.  The leads are  palpable under the skin and the patient reports extreme pain over the  device and the leads.  She reports inability to sleep at night.  She  feels that her symptoms are intolerable and now I request pacemaker  pocket revision.   DESCRIPTION OF PROCEDURE:  Informed written consent was obtained and the  patient was brought to the electrophysiology lab in the fasting state.  A significant amount of counseling was performed with the patient and  her spouse regarding the risk of pocket revision.  The patient and her  spouse were clear that her pain and decreased quality of life were  significant and outweighed the risks of  infection, bleeding, damage to  the leads requiring revision, and possible extraction of the device and  leads if infection occurred.  She was adequately sedated with  intravenous Versed and fentanyl as outlined in the nursing report.  The  right chest was prepped and draped in the usual sterile fashion by the  EP lab staff.  Her pacemaker was interrogated and this revealed stable  lead measurements of the atrial and ventricular leads with a ventricular  lead R-wave of 4 mV with a threshold of 1.5 V at 0.4 msec.  The atrial  lead threshold was 0.5 V at 0.4 msec and the patient was dependent upon  atrial pacing and P-wave could not be determined today.  Her battery  status was good with at least 3 years of longevity remaining.  The skin  overlying the right deltopectoral region was infiltrated with lidocaine  for local analgesia.  A 5-cm incision was made over the existing  pacemaker pocket.  Using a combination of sharp and blunt dissection,  the pacemaker was exposed and removed from the body, but still connected  to the leads.  The leads were freed within the pocket.  The patient's  device was noted to have been in a subcutaneous position with no  evidence of malposition, foreign material, or infection.  The pocket was  carefully debride for adhesions.  The device was then placed into a  submuscular position between the pectoralis major and minor muscles.  Electrocautery and direct manual pressure were used to assure  hemostasis.  The pocket was then irrigated with copious gentamicin  solution.  The pectoralis major muscle incision was repaired with 2-0  Vicryl suture.  Again hemostasis was assured.  The pocket was then  closed in two layers with 2-0 Vicryl suture for the subcutaneous and  subcuticular layers.  Steri-Strips and a sterile dressing were then  applied.  They were no early apparent complications.  Following the  procedure, the pacemaker was again interrogated and all  measurements  were unchanged.   CONCLUSIONS:  1. Sinus node dysfunction, second-degree AV block, and symptomatic      bradycardia upon presentation.  2. The patient's existing pacemaker was revised to a right submuscular      position due to extreme pain over the existing pacemaker site.  3. Lead measurements and integrity of the leads was confirmed.  4. No early apparent complications.      Hillis Range, MD  Electronically Signed     JA/MEDQ  D:  05/10/2009  T:  05/11/2009  Job:  [id number]   cc:   [name], M.D.

## 2011-05-15 NOTE — Cardiovascular Report (Signed)
[name], [name]             ACCOUNT NO.:  [id number]   MEDICAL RECORD NO.:  [id number]          PATIENT TYPE:  OIB   LOCATION:  5706                         FACILITY:  [facility]   PHYSICIAN:  [name], M.D. DATE OF BIRTH:  1950/02/13   DATE OF PROCEDURE:  08/25/2007  DATE OF DISCHARGE:                            CARDIAC CATHETERIZATION   INDICATIONS FOR TEST:  This 61 year old female presents with chest pain.  She describes it as a squeezing sensation in her chest associated with  shortness of breath, diaphoresis and nausea.  She has a paced rhythm.  The event occurred about 2 weeks prior to coming to my office.  Because  of the symptoms, she is brought to the catheterization lab for cardiac  catheterization.  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   After obtaining informed consent, the patient was prepped and draped in  the usual sterile fashion exposing the right groin.  Following local  anesthetic with 1% Xylocaine, the Seldinger technique was employed, and  a 5-French introducer sheath was placed in the right femoral artery.  Left and right coronary arteriography, ventriculography in the RAO  projection and distal aortogram were performed.   COMPLICATIONS:  None.   TOTAL CONTRAST USED:  135 mL.   EQUIPMENT:  5-French Judkins configuration catheters.   RESULTS:  1. Hemodynamic monitoring:  Her central aortic pressure was 131/65.      Her left ventricular pressure was 130/15, and at the time of      pullback, there was no gradient across the aortic valve.  2. Ventriculography:  Ventriculography in the RAO projection using 25      mL of contrast at 12 mL per second revealed a markedly dilated left      ventricle.  The apex was akinetic.  The anterior and inferior wall      appeared to be hyperkinetic.  The ejection fraction was calculated      at  46%.  The left ventricular end-diastolic pressure was 24.  3. Distal aortogram:  A distal aortogram done below the diaphragm      revealed no evidence of renal artery stenosis, and the right renal      artery actually is in her pelvis.  4. Coronary arteriography:  On fluoroscopy, there was no calcification      appreciated.      a.     Left main normal.  It bifurcated.      b.     Circumflex:  The circumflex was a large vessel greater than       4.5 mm.  There was a big first OM and a second OM, all of which       were free of disease.      c.     LAD.  The LAD crossed the apex of the heart.  It was a 4.5       mm vessel.  There was a moderate-size first diagonal that came off  very proximally and then a second diagonal that was also moderate       size.  This entire system was free of disease.      d.     Right coronary artery:  This was a large dominant vessel       with a 4 mm RCA. The PDA and two posterolateral branches were       relatively small in diameter but free of disease.   CONCLUSION:  1. No evidence of coronary artery disease.  2. No renal artery stenosis.  3. Left ventricular dysfunction with ejection fraction of 46% and      apical akinetic segment.   I cannot explain her chest pain from a cardiac standpoint.  In the past,  her ejection fraction has been as low as 35%.  Clearly it has improved  with medical therapy.  She does not need an AICD type device.           ______________________________  Thereasa Solo Little, M.D.     ABL/MEDQ  D:  08/25/2007  T:  08/25/2007  Job:  [id number]   cc:   [name], M.D.  Catheterization Lab

## 2011-05-18 ENCOUNTER — Telehealth: Payer: Self-pay | Admitting: *Deleted

## 2011-05-18 DIAGNOSIS — R52 Pain, unspecified: Secondary | ICD-10-CM

## 2011-05-18 MED ORDER — HYDROCODONE-ACETAMINOPHEN 10-650 MG PO TABS: 1.0000 | ORAL_TABLET | Freq: Four times a day (QID) | ORAL | Status: AC | PRN

## 2011-05-18 NOTE — Op Note (Signed)
NAMEJAHARA, [name]             ACCOUNT NO.:  [id number]   MEDICAL RECORD NO.:  [id number]          PATIENT TYPE:  OIB   LOCATION:  4736                         FACILITY:  [facility]   PHYSICIAN:  Adolph Pollack, M.D.DATE OF BIRTH:  [birth year]   DATE OF PROCEDURE:  04/25/2006  DATE OF DISCHARGE:                                 OPERATIVE REPORT   PREOPERATIVE DIAGNOSES:  1.  Symptomatic cholelithiasis.  2.  An 8 cm right upper quadrant abdominal wall soft tissue mass.   POSTOPERATIVE DIAGNOSES:  1.  Symptomatic cholelithiasis.  2.  An 10 cm right upper quadrant abdominal wall soft tissue mass.   PROCEDURES:  1.  Laparoscopic cholecystectomy with intraoperative cholangiogram.  2.  Excision of 10 cm right upper quadrant abdominal wall soft tissue mass.   SURGEON:  Adolph Pollack, M.D.   ASSISTANT:  Sheppard Plumber. Earlene Plater, M.D.   ANESTHESIA:  General.   INDICATIONS:  [name] is a 61 year old female who has had a fullness in  the right upper quadrant that is enlarged.  It appears to be lipomatous,  however, is enlarging, thus the reason for potential of excision.  She has  also had some postprandial right upper quadrant discomfort and nausea and  has known gallstones.  These symptoms are getting worse and it is difficult  for her to eat.  She now presents for the above procedures.   TECHNIQUE:  She was seen in the holding area and the abdominal wall mass and  right upper quadrant was marked with my initials.  She was then brought to  the operating room, placed supine on the operating table and a general  anesthetic was administered.  Her abdominal wall was then widely sterilely  prepped and draped.  I injected dilute Marcaine solution in the  supraumbilical region.  A supraumbilical incision was made through the skin  and subcutaneous tissue and midline fascia and peritoneum entering the  peritoneal cavity under direct vision.  A pursestring suture of 0 Vicryl was  placed  around the fascial edges.  A Hassan trocar was introduced into the  peritoneal cavity.  Pneumoperitoneum was created by insufflation of CO2 gas.  Next, the laparoscope was introduced.  There was some lower midline  adhesions noted.  I placed the patient in reverse Trendelenburg position,  the right side tilted slightly up.  An 11 mm trocar was placed through an  epigastric incision and two 5 mm trocars placed in the right mid lateral  abdomen.  The fundus of the gallbladder was grasped.  Adhesions between the  duodenum and the omentum was noted and these were taken down with careful  blunt dissection.  The fundus was then retracted toward the right shoulder.  The infundibulum was grasped and mobilized by dissection on the gallbladder.  The cystic duct was isolated and a window was created around it.  A clip was  placed at the gallbladder cystic duct junction.  A small incision was made  in the cystic duct.  A cholangiocath was passed through the anterior  abdominal wall and placed into the cystic duct and a  cholangiogram was  performed.   Under real time fluoroscopy, dilute contrast material was injected to the  cystic duct.  The common hepatic and right and left hepatic and common bile  ducts all filled promptly and contrast drained without delay into the  duodenum.  There was a little bit of a lucency present and appeared to be a  linear lucency in the mid to distal common bile duct region. It could have  been a shadowing or overlying ring.  I discussed this with Dr. Antionette Poles.  He said very likely this was just some sort of artifact or shadowing  artifact, but there is a very, very small chance it would be possible sludge  or stone.   I removed the cholangiocatheter, I clipped the cystic duct three times  proximally and divided.  Using blunt dissection, I identified both an  anterior and posterior branch of the cystic artery.  These were clipped and  divided.  This was also a blood  vessel going directly into the gallbladder  that began to bleed and I dissected out and clipped it.  I then used  electrocautery to dissect the gallbladder free from the liver bed.  I  noticed an accessory duct of Luschka coming directly from the gallbladder  fossa into the gallbladder and placed a clip on it.  There was a small hole  made in the gallbladder with some bile leakage.  I then placed the  gallbladder into an Endopouch bag once it was removed.  Bleeding points in  the gallbladder fossa were controlled with electrocautery.  The perihepatic  area was copiously irrigated with saline solution and solution evacuated.  Hemostasis appeared be adequate and no bile leak was noted.  Surgicel was  placed in the gallbladder fossa.  As much irrigation fluid as possible was  evacuated.   The gallbladder was removed through the supraumbilical port in an Endopouch  bag and I replaced that trocar and inspected the gallbladder fossa which was  hemostatic.  I then removed the supraumbilical port and closed the fascial  defect under laparoscopic vision by tightening up and tying down the  pursestring suture.  The remaining trocars were removed and pneumoperitoneum  was released.  The skin incisions were closed with 4-0 Monocryl subcuticular  stitches followed by Steri-Strips and sterile dressings.   Next, I changed my gloves and approached the right upper quadrant abdominal  wall mass.  A transverse incision was made directly through the skin and  subcutaneous tissue.  This appeared to be basically excess fatty  tissue/lipomatous tissue which I excised with the cautery and measured 10  cm.  Hemostasis was obtained using cautery.  I then injected Marcaine local  anesthesia solution to the subcutaneous tissue.  I close this wound in two  layers.  The deep layer was closed with running two Vicryl suture.  The skin closed with 3-0 Monocryl subcuticular stitch.  Steri-Strips and sterile  dressings  was applied.   She tolerated both procedures well without any apparent complications.  She  is taken to the recovery room in satisfactory condition.      Adolph Pollack, M.D.  Electronically Signed     TJR/MEDQ  D:  04/25/2006  T:  04/26/2006  Job:  [id number]   cc:   [name], M.D.  Fax: [phone]   [name], M.D.  Fax: [phone]

## 2011-05-18 NOTE — Discharge Summary (Signed)
NAME:  [name], [name]             ACCOUNT NO.:  [id number]   MEDICAL RECORD NO.:  [id number]          PATIENT TYPE:  OIB   LOCATION:  3739                         FACILITY:  [facility]   PHYSICIAN:  Nicki Guadalajara, M.D.     DATE OF BIRTH:  12-14-1950   DATE OF ADMISSION:  03/02/2005  DATE OF DISCHARGE:  03/03/2005                                 DISCHARGE SUMMARY   HISTORY OF PRESENT ILLNESS:  Ms. Remlinger is a 61 year old white married  female patient of Dr. Julieanne Manson, who comes in the hospital for elective  generator replacement of her pacemaker. She has end of life battery and has  a history of atrial fibrillation with sick sinus syndrome. The pacemaker was  placed on March 02, 2005 by Dr. Lenise Herald. She had an In-Rhythm P150-1DR  [employer] placed after explantation. On March 03, 2005, she was seen by Dr.  Nicki Guadalajara. Her blood pressure was 98/80. Pulse was 77. Respiratory rate  20. Temperature 97.5. O2 saturations were 96%. Her chest x-ray showed no  pneumothorax. It was decided that she would be started on Coumadin as an  outpatient. Her pacemaker site was without any hematoma. There was soreness  to the area. She was taking some narcotic medication. She does have a  history of fibromyalgia. On chronic Darvocet at home. However, the Darvocet  did not help her discomfort, thus she had Fentanyl and Percocet in the  hospital.   DISCHARGE MEDICATIONS:  1.  Diltiazem ER 180 mg two times per day.  2.  Digoxin 1 and 1/2 tabs at 0.25 mg to 0.375 mg per day.  3.  Metoprolol 50 mg two times per day.  4.  K-Dur 20 mEq two times per day.  5.  Spirolactone 25 mg two times per day.  6.  Levothyroxine 50 mcg one time per day.  7.  Furosemide 50 mg two times per day.  8.  Claritin 10 mg one time per day.  9.  Norvasc 5 mg one time per day.  10. Lexapro 10 mg one time per day.  11. Nitroglycerin patch 0.4 mg an hour every day.  12. Guaifenesin 600 mg two times a day, which is p.r.n.  13.  Folic acid 1 mg one time per day.  14. Spiriva 18 mcg, it is an inhaler.  15. Metformin 500 mg one time a day.  16. Amiodarone 300 mg one time per day.  17. She should start her Coumadin as regular dose tomorrow night.  18. Oxycodone 5 mg, she can take 1 to 2 p.r.n. for pain. She was given 45      pills with no refills.   ACTIVITY:  No lifting or strenuous arm movement.   DIET:  Low sodium diet.   WOUND CARE:  She should keep the site dry for 5 days, then wash gently with  soap and water.   SPECIAL INSTRUCTIONS:  She should have pro-time checked on Thursday.   FOLLOW UP:  1.  She should followup for a wound site check in 1 week.  2.  She should call Monday for  an appointment to see Nada Boozer, R.N. on      the 13th.   DISCHARGE DIAGNOSES:  1.  End of life pacemaker with explantation and implantation of new In-      Rhythm P150-1DR to the right subclavian area.  2.  Paroxysmal atrial fibrillation with sick sinus syndrome.  3.  Diabetes mellitus, noninsulin-dependent.  4.  History of nonischemic cardiomyopathy with an improvement of ejection      fraction on last echocardiogram on January 02, 2005 with normal left      ventricular systolic pressure.  5.  History of phlebitis.  6.  Anticoagulation secondary to phlebitis and paroxysmal atrial      fibrillation.  7.  Sleep apnea. She uses C-PAP.  8.  Morbid obesity.      BB/MEDQ  D:  03/03/2005  T:  03/04/2005  Job:  [id number]

## 2011-05-18 NOTE — Discharge Summary (Signed)
[name]. [name]  Patient:    [name], [name]                    MRN: [id number] Adm. Date:  [id number] Disc. Date: [id number] Attending:  Osvaldo [name] CC:         [name], M.D.             [name], M.D., [facility]                           Discharge Summary  BRIEF HISTORY:  This is a 61 year old white female with a history of morbid obesity, fibromyalgia, sleep apnea, pacemaker insertion for sick sinus syndrome, hypertension, insomnia, presented to the office on [name] 11, 2001, with erythema of the right anterior ankle, and a tender superficial vein extending above the knee.  The patient had a similar problem in 1996 treated by [name] with heparin.  There is no evidence of deep vein thrombosis on Doppler exam.  She is also complaining of excessive palpitations and shortness of breath despite pacemaker.  Recently, she was started on Rythmol by her cardiologist, [name]. She was admitted for telemetry monitoring and IV heparin therapy.  HOSPITAL COURSE:  The patient was admitted to division 3700, telemetry unit. She was placed on IV heparin per per pharmacy protocol.  Her right leg was elevated and a K pad was ordered.  She was seen in consultation by [name] who was covering for [name] who was out of town.  The patients EKG showed AV sequential pacing.  Her digoxin level was 2.2.  Her arterial blood gases was pH 7.455, PCO2 39, pO2 90.  2-D echocardiogram was obtained to look at LV function considering that the patient was on Rythmol.  It was recommended that the patient Lanoxin be decreased to 0.25 mg daily because of a digoxin level of 2.2.  The patient was started on Coumadin per pharmacy protocol [name] 12, 2001. Superficial phlebitis was very slow to respond to IV heparin therapy.  It was thought she possibly had an element of cellulitis in addition to the superficial phlebitis and she was  started on p.o. Keflex.  It was also felt she would benefit from electrophysiology consultation because of continued complaints of pacemaker problems over the past few months.  The pacemaker had been interrogated on a previous hospitalization by a representative from the pacemaker company.  The patient underwent a 2-D echocardiogram with Doppler [name] 14, 2001, showing mild left ventricular dilatation with moderately reduced systolic function. The estimated ejection fraction was 35-40%.  There was septal dyskinesis thought probably due to pacemaker.  The patient had a mildly thickened aortic valve with trivial regurgitation.  She had a mildly thickened mitral valve with mild to moderate regurgitation.  She had a structurally normal tricuspid valve with mild regurgitation.  The patient had mild pulmonary hypertension with an estimated pulmonary artery pressure of 40-45 mmHg.  The pacer wire was noted to be in the right atrium and in the right ventricle.  The patient was thought to have mildly depressed right ventricle systolic function.  The patients CBC on admission showed a WBC on 9,000 with a hemoglobin of 12.2 grams and a MCV of 87.9.  Platelet count was 210,000.  On admission she had an admission protime of 14.0, with an INR of 1.2, and a PTT of 32 seconds.  She had a normal potassium on admission of 4.4, a normal BUN of 19, and a normal creatinine of 1.3.  Glycohemoglobin was 5.6% and TSH was normal at 3.011.  FSH was slightly elevated at 21.7, normal being up to 18.1.  This would be consistent with an ovulatory state in which levels are 5.1 to 34.2.  However, the patient has had amenorrhea for several months, presumably due to morbid obesity, however, at her age of 88 years she certainly is in the menopausal age range and could certainly be perimenopausal at this point.  It is anticipated that we will repeat her [facility] in about 6 months.  At this time, it is not felt she is a candidate  for estrogen replacement given her phlebitis at the present time.  MEDICATIONS: At the time of admission the patient was taking the following medications:  1. Darvocet-N 100 p.r.n. pain.  2. Aspirin.  3. Claritin.  4. Lanoxin.  5. Niferex.  6. Demodex  7. Celexa.  8. Home oxygen for nighttime use.  9. Cozaar. 10. Potassium supplement. 11. Aldactone. 12. Ambien. 13. Serevent. 14. Albuterol p.r.n. 15. Rythmol.  She was seen in consultation by [name], electrophysiologist, on [name] 16, 2001.  He noted that her cardiac exam revealed regular heart sounds without murmurs, rubs or gallops.   He noticed some peripheral edema.  He also noted that interrogation of her pacemaker showed an R wave of 5, a P wave of 0.75, atrial pacing threshold was 1 volt at 0.4 milliseconds.  Ventricular pacing threshold was 1.5 at 0.6 milliseconds.  The battery voltage was 2.77 with a atrial impedance of 371 and ventricular lead impedance of 376.  Heart rate histograms demonstrated a reasonable heart rate spectrum.  Notably, however, she had 8% beats in the PVE category which typically identified premature ventricular activity.  She also had 45 Mobitz episodes all of which were less than 6 minutes and 39 of which were less than 52 seconds.   A measured atrial tachycardia with these ranged from 150 to 300 milliseconds and this was timed over 155 days.  Thus, the patient was felt to have premature ventricular contractions and premature atrial contractions.  It was noted she had sinus node dysfunction with junctional escape and bradycardia.  She has a history of paroxysmal atrial fibrillation documented on at least 1 occasion in 1999.  The patient had a cardiac catheterization in August 2000, showing no significant coronary artery disease.  She had been tried on Tenormin, Lopressor, verapamil and presently Lanoxin for her arrhythmias.  It seems that Rythmol clinically was superior to those  medications and that she had less problems with palpitations.  [name] suggested that her Rythmol be re-dosed  at 225 mg b.i.d.  It was also suggested that she could use an outpatient event recorder if her symptoms recurred.  The patient was very slow to reach therapeutic INR on Coumadin and required larger doses of Coumadin for her INR to be therapeutic.  On [name] 21, 2001, it was noticed her INR was 1.9 on Coumadin on 12.5 mg daily.  It was also noted on [name] 21, 2001, that she had premature ventricular contractions on rhythm strips that were symptomatic.  Dr. Clarene Duke saw the patient [name] 22, 2001, and indicated that her Lanoxin needed to be increased back to 0.25 mg every other day alternating with 0.375 mg every other day maintaining her digoxin level between 1.7 and 2.2.  The patient was discharged home [name] 22,  2001, in stable condition.  Her extensive superficial thrombophlebitis of her right leg had improved somewhat but still was tender to palpation and still had some residual erythema and induration.  She was afebrile at the time of discharge and her vital signs were stable.  It was noted that her protime would be rechecked on Thursday, [name] 24, 2001, as an outpatient.  It was anticipated that we would keep her on Coumadin for 3-6 months because of her multiple risk factors.  DISCHARGE MEDICATIONS:  1. Coumadin 12.5 mg daily.  2. Rythmol 225 mg twice a day.  3. Darvocet-N 100 every 4 hours for pain as needed.  4. Celexa 30 mg daily.  5. Lanoxin 0.25 mg every other day alternating with 0.375 mg every other day.  6. Serevent 2 sprays by mouth every 12 hours.  7. Aldactone 25 mg twice a day.  8. Cozaar 50 mg daily.  9. Ambien 5-10 mg at bedtime for insomnia. 10. Demodex 40 mg daily. 11. K-Dur 20 mEq daily. 12. Niferex 150 mg daily.  The patient was advised to undergo light activity about the house with no heavy lifting or housework.  DIET:  She was to maintain a 4 gram  sodium fat modified diet and keep her leg elevated as much as possible.  FOLLOWUP:  She was advised to call [name] if symptoms of arrhythmia occurred, and she was to see [name] on Thursday, [name] 24, 2001, to have her protime checked and to see me 3 weeks after discharge.  I might add that her protime INR at the time of discharge on 12.5 mg of Coumadin was 2.4 and it was anticipated that we needed to keep her INR between 2 and 3.  DISCHARGE DIAGNOSES:  1. Superficial thrombophlebitis of the right leg (extensive).  2. Cardiac arrhythmias (bigeminy)  3. Premature atrial contractions  4. Paroxysmal atrial tachycardia (morbid obesity, hypertension, sleep apnea,     fibromyalgia syndrome).  CONSULTANTS:  1. [name]  2. Dr. Thereasa Solo. [name].  3. Dr. Nathen [name] DD:  07/16/00 TD:  07/18/00 Job: [id number]

## 2011-05-18 NOTE — Telephone Encounter (Signed)
Pt requesting refill for Hydrocodone APAP 10/650 #90 one tab q6h prn pain.  Last refill noted in chart was 12/15/10.

## 2011-05-18 NOTE — Cardiovascular Report (Signed)
[place]. [facility]  Patient:    Cynthia, [name] Visit Number: [id number] MRN: [id number]          Service Type: CAT Location: [phone] Attending Physician:  Loreli Dollar Dictated by:   Julieanne Manson, M.D. Proc. Date: 04/13/02 Admit Date:  04/13/2002 Discharge Date: 04/14/2002   CC:         Cardiac Catheterization Lab  [name]. Lenord Fellers, M.D.  Clinton D. Maple Hudson, M.D.   Cardiac Catheterization  INDICATIONS:  [name] is a 61 year old female who is obese, has sleep apnea and hypertension.  She has chronic right-sided heart failure.  She had a catheterization three years ago that showed no coronary disease, but she has continued to have chest pain relieved with nitrites.  She complains of jaw, neck and upper shoulder pain with any type of activity, relieved with 3-5 nitroglycerin.  She has a permanent pacemaker in, which precludes EKG evaluation of her pain; but, more importantly, had such an adverse reaction to the adenosine in the past that she refuses any type of noninvasive evaluation. Because of this she is brought in for right and left heart catheterization. She will be kept overnight because of her obesity; I am uncomfortable with her going home in 6-8 hours.  DESCRIPTION OF PROCEDURE:  The patient was prepped and draped in the usual sterile fashion, exposing the right groin and applying local anesthetic with 1% Xylocaine.  The Seldinger technique was employed and a 6-French introducer sheath was placed into the right femoral artery, and an 8-French introducer sheath into the right femoral vein.  A Swan-Ganz catheter was then advanced through its normal route into the pulmonary artery, with hemodynamic monitoring undertaken throughout each station.  Ventriculography in the RAO projection and selective right and left coronary arteriography was performed. Cardiac output by thermodilution was performed, and simultaneous AO  and pulmonary artery oxygen saturations were obtained on room air.  COMPLICATIONS:  None.  RESULTS:  HEMODYNAMIC MONITORING: 1. Right atrial pressure: 15. 2. Right ventricular pressure: 49/20. 3. Pulmonary artery pressure:  42/26. 4. Pulmonary capillary wedge pressure:  24. 5. Central aortic pressure:  135/79. 6. Left ventricular pressure:  139/22. 7. Cardiac output (thermodilution):  9.1 L/min, for a cardiac index 3.8. 8. Cardiac output (FICK):  5.4 L/min, cardiac index 2.2. 9. Oxygen saturations (on room air):  AO 96%, pulmonary artery 72%.  VENTRICULOGRAPHY:  Done in the RAO projection. Revealed the left ventricle to be markedly dilated, with global mild hypokinesis.  The apical segment slightly more hypokinetic than other segments.  The end-diastolic pressure was 22.  The ejection fraction calculated at 46%.  Ventricular ectopy was noted during the ventriculogram.  CORONARY ARTERIOGRAPHY:  There was no calcification noted on fluoroscopy. 1. LEFT MAIN:  Normal. 2. LEFT ANTERIOR DESCENDING ARTERY:  Extended down and around the apex of the    heart.  This gave rise to two diagonal branches.  This system was free    of disease. 3. OPTIONAL DIAGONALS:  Small and free of disease. 4. CIRCUMFLEX CORONARY ARTERY:  Normal with two OMs; the first OM was large,    the second was medium sized.  No obstructions. 5. RIGHT CORONARY ARTERY:  Normal.  CONCLUSION: 1. No coronary artery disease. 2. Mildly elevated pulmonary artery pressures, at 42/26. 3. Dilatation of the left ventricle, with decreased ejection fraction at 46%.  I can find nothing to explain her chest pain from a cardiac standpoint.  She will be followed up  in my office in about 10 days, and should be able to be discharged tomorrow. Dictated by:   Julieanne Manson, M.D. Attending Physician:  Loreli Dollar DD:  04/13/02 TD:  04/13/02 Job: [id number]

## 2011-05-18 NOTE — H&P (Signed)
[place]. [facility]  Patient:    [name], [name]                    MRN: [id number] Adm. Date:  [id number] Attending:  Sharlet Salina CC:         [name], M.D.             Clinton D. Maple Hudson, M.D.                         History and Physical  CHIEF COMPLAINT:  "My leg."  HISTORY OF PRESENT ILLNESS:  This 61 year old white female with a history of morbid obesity, fibromyalgia syndrome, sleep apnea and pacemaker insertion for sick sinus syndrome, hypertension and insomnia, presented to the office with erythema of the right anterior ankle and tender superficial vein extending above the knee on 05/10/00.  The patient had a similar problem in 1986, treated by Dr. Chanda Busing, with heparin.  There is no evidence of DVT on Doppler exam.  She is also complaining of excessive palpitations and shortness of breath despite the pacemaker.  Recently, it was started by Dr. Caprice Kluver, her cardiologist, on Rythmol.  She is now admitted for telemetry monitoring and IV heparin therapy.  PAST MEDICAL HISTORY:  Please see very complete History & Physical examination dictated in January, 2001, when she was hospitalized with pneumonia.  SOCIAL HISTORY:  Married; no children.  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  No smoking history.  FAMILY HISTORY:  Please see dictated History & Physical, January, 2001.  REVIEW OF SYSTEMS:  The patient does complain of some chest tightness and vague  chest pain.  Had a negative cardiac catheterization in August, 2000.  Complains of shortness of breath, fatigue and dyspnea on exertion, but she is morbidly obese.  PHYSICAL EXAMINATION:  VITAL SIGNS:  Temperature 99 degrees orally, blood pressure 115/71, pulse 82 and regular, respiratory rate 24.  SKIN:  Warm and dry.  NODES:  None.  HEENT:  Head is  normocephalic, atraumatic.  Sclerae and conjunctivae are clear.  NECK:  Supple; no JVD, thyromegaly or carotid bruits.  CHEST:  Clear to auscultation.  CARDIAC:  Regular rate and rhythm, with a 1/6 systolic ejection murmur.  ABDOMEN:  Bowel sounds are active; no hepatosplenomegaly, masses or tenderness.  EXTREMITIES:  The right lower extremity is erythematous about the ankle, with a  small abrasion noted anteriorly, covered by a Band-Aid.  No pus is expressed from that abrasion.  There is a tender superficial vein prominent from that area, extending up toward her knee and just above her knee.  It is indurated and somewhat hot to touch, with some erythema.  There is no pitting edema of the lower extremities.  NEUROLOGIC:  No focal deficits.  IMPRESSION: 1. Superficial phlebitis (recurrent) of the right lower extremity; last    episode, 1996. 2. Palpitations. 3. Chest pain (negative catheterization, August, 2000). 4. Hypertension. 5. Sleep apnea. 6. Morbid obesity. 7. Fibromyalgia. 8. Chronic fatigue. 9. Irritable bowel syndrome.  PLAN:  The patient will be treated with IV heparin and started on Coumadin, which we will only maintain for some four weeks.  Will be monitored by telemetry and cardiology consult will be obtained with regard to her palpitations, etc., - ?pacemaker adjustment.  2D-echocardiogram will be obtained. DD:  05/12/00 TD:  05/12/00 Job: [id number]

## 2011-05-18 NOTE — Discharge Summary (Signed)
NAMEGEARLDINE, [name]             ACCOUNT NO.:  [id number]   MEDICAL RECORD NO.:  [id number]          PATIENT TYPE:  INP   LOCATION:  4736                         FACILITY:  [facility]   PHYSICIAN:  Adolph Pollack, M.D.DATE OF BIRTH:  05/30/1950   DATE OF ADMISSION:  04/25/2006  DATE OF DISCHARGE:  04/27/2006                                 DISCHARGE SUMMARY   PRINCIPAL DISCHARGE DIAGNOSES:  1.  Symptomatic cholelithiasis.  2.  Chronic cholecystitis.   SECONDARY DIAGNOSES:  1.  Right upper quadrant lipoma.  2.  Sleep apnea.  3.  Sick sinus syndrome.  4.  Cardiomyopathy.   PROCEDURE:  Laparoscopic cholecystectomy and excision of right upper  quadrant abdominal mass.   REASON FOR ADMISSION:  [name] is a 61 year old female with a groin  soft tissue mass in the right flank, symptomatic cholelithiasis, was  admitted for the above procedures.   HOSPITAL COURSE:  She underwent the above procedures.  Postoperatively she  was placed on telemetry unit and was stable.  Type 2 diabetes mellitus  treated with sliding scale.  She had nighttime oxygen.  She was quite sore  and having difficulty getting out of bed the first postoperative day but by  the second postoperative day was much more comfortable, felt better, was  able to be discharged.   DISPOSITION:  Discharged to home in satisfactory condition April 27, 2006.  She is told to continue home medicines and take Tylox for pain.  She is  given specific activity restrictions.  She is told to resume her Coumadin  and have her INR checked in 4 days.  Will return to office and see me in 2  weeks.      Adolph Pollack, M.D.  Electronically Signed     TJR/MEDQ  D:  06/19/2006  T:  06/19/2006  Job:  [id number]   cc:   [name], M.D.  Fax: [phone]   [name], M.D.  Fax: [phone]

## 2011-05-18 NOTE — Consult Note (Signed)
[place]. [facility]  Patient:    [name]                     MRN: [id number] Proc. Date: 01/26/00 Adm. Date:  [id number] Attending:  Sharlet Salina CC:         [name], M.D.                          Consultation Report  REASON FOR CONSULTATION:  Pulmonary infiltrates on chest x-ray, hypoxemia.  HISTORY OF PRESENT ILLNESS:  A 61 year old nonsmoking white female known to me or obstructive sleep apnea now with history that four-weeks ago she developed a bronchitis syndrome with green sputum, fever, night sweats, and wheezing. Treated by Dr. Luanna Cole. [place] with a total of 20 days of Levaquin, Ceftin, and a prednisone taper.  It was better for about three days after the prednisone and then gradually began developing more sweats, wheeze, some vague chills, possibly rigors but without ny single hard chill event.  Temperature may have been as high as 102 but exact timing of that is unclear.  Reportedly a chest x-ray about January 17, 2000 at the [facility] showed "a little something in the left lower lobe but they let me go home."  She reports an office oximetry at Dr. Luanna Cole. [place] yesterday showed saturation of 72% and she was sent to the emergency room where she has spent the night, and now about 6 p.m. he next day is just getting a room assignment.  She received one dose of Levaquin n the emergency room yesterday.  No antibiotic yet today.  REVIEW OF SYSTEMS:  Chest congestion, malaise, shortness of breath improved on oxygen.  Mild global headache.  She said this is because she slept poorly during the night with no visual change or focal neurologic problems.  Throat has felt  little sore or raspy.  No significant nasal congestion or discharge.  No adenopathy or rash.  She has obstructive sleep apnea but had not been using C-Pap because he was "afraid it would push the mucus in deeper."  She denies nausea, vomiting, change in bowel  or bladder, dysuria, or leg pain.  PAST MEDICAL HISTORY:  Obstructive sleep apnea on C-Pap at 10 cm of pressure. Morbid obesity.  Hypoventilation syndrome.  Pacemaker for sick sinus syndrome.  Adenosine-Cardiolite study August of 2000 showed ejection fraction of 49% and raised suspicion of anterior and apical ischemia (Dr. Gaspar Garbe B. Little). Subsequent normal cardiac catheterization August 2000.  Umbilical hernia repair complicated by wound abscess and infected Marlex mesh with sepsis June 1999 (Dr. Adolph Pollack).  Superficial phlebitis right leg 1986 treated with Coumadin.  Some kind of Coumadin complications around that time also. 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 Never documented active TB.  SOCIAL HISTORY:  Married with no children.  Nonsmoker.  No alcohol.  Has worked as a Engineer, civil (consulting) for [employer]  FAMILY HISTORY:  Father died in an accident.  Mother died at age 55 of MI.  One  brother with history of CHF and coronary disease.  One sister in good health.  PHYSICAL EXAMINATION:  VITAL SIGNS:  By monitor, paced cardiac rhythm at 78 per minute.  Blood pressure and temperature are now being taken.  GENERAL:  Morbidly obese, alert woman chatting on telephone, sitting  on edge of bed wearing nasal oxygen at about four liters per minute.  SKIN:  Some excoriation on lower extremities.  Otherwise no rash seen.  LYMPH NODES:  No adenopathy found.  HEENT:  Atraumatic.  Gross hearing and vision intact.  Oral mucosa seems clear.   NECK:  No neck vein distention, bruit, stridor, or thyromegaly.  CHEST:  Pacemaker in right pectoral pocket.  HEART:  Regular without murmur, rub, or gallop heard.  LUNGS:  Shallow, mildly breathless with inspiratory and expiratory wheeze and fine rhonchi through all lung fields.  Nonproductive cough.  No rub or  dullness.  BREASTS:  Not examined.  ABDOMEN:  Bowel sounds are heard.  She is eating saltines.  Massively obese without apparent organomegaly.  EXTREMITIES:  Very heavy legs.  Nonpitting.  No cyanosis.  No obvious active inflammation or tenderness.  No clubbing.  LABORATORY DATA:  Chest x-ray shows diffuse bilateral patchy infiltrates without effusion or adenopathy.  No consolidation.  ABG:  Room air pH 7.51, pCO2 42, pO2 59, bicarbonate 34.   WBC 5500, hemoglobin  11.7.  IMPRESSION: 1. Most likely a viral pneumonia.  Differential diagnosis is broad at this age    including aspiration, early adult respiratory distress syndrome with sepsis,    pulmonary emboli.  I doubt tuberculosis, Chlamydia, or microplasma infection. 2. Obstructive sleep apnea. 3. Obesity hypoventilation syndrome. 4. Hypoxic respiratory failure secondary to #1. 5. Pacemaker with sick sinus syndrome.  RECOMMENDATIONS:  IV Levaquin is reasonable pending cultures but she needs blood cultures done as well as sputum and urine.  Needs follow-up chest x-ray and ABG. Needs DVT prophylaxis and close attention for possibility of pulmonary emboli with consideration of doppler ultrasound and CT scan of chest if there is course raising concern.  She should use her C-Pap from home and understands to have that brought in.  Oxygen can be added through the mask.  Oxygen should be titrated for saturation.  Note that [facility] is covering me this weekend. DD:  01/26/00 TD:  01/28/00 Job: [id number]

## 2011-05-18 NOTE — Telephone Encounter (Signed)
Rx called in per MD 

## 2011-05-18 NOTE — Discharge Summary (Signed)
. [facility], [place]  Patient:    [name]                     MRN: [id number] Adm. Date:  [id number] Disc. Date: [id number] Attending:  Sharlet Salina CC:         Clinton D. Maple Hudson, M.D.             [name], M.D.                           Discharge Summary  FINAL DIAGNOSES:  1. Pneumonia.  2. Sleep apnea.  3. Hypoxic respiratory failure secondary to #1.  4. Pacemaker with history of sick sinus syndrome.  5. Hypertension.  6. Morbid obesity.  7. Gastroesophageal reflux disease.  CONSULTANTS:  Clinton D. Maple Hudson, M.D.  HISTORY OF PRESENT ILLNESS:  This 61 year old white female with morbid obesity,  sleep apnea, hypertension, and a pacemaker for history of sick sinus syndrome with fatigue and depression was seen in the office January 16 with respiratory infection and a temperature of 100 degrees orally.  She was treated with a 10-day course f Ceftin.  She had a chest x-ray on January 18 showing no infiltrate and sinus films were negative.  She presented to the office on January 25 with fever of 102 degrees, sweats, wheezing, and dyspnea on exertion.  Pulse oximetry in the office was 77%.  Rales were heard bilaterally and she was sent to the emergency department for further evaluation.  In the emergency department, PO2 was 54 on room air and chest x-ray showed bilateral patchy infiltrates in the upper and lower lobes. The patient had been vomiting since January 24, 2000, and was not able to keep own medications.  She was admitted for IV antibiotics, IV fluids, and IV antiemetics.  PAST MEDICAL HISTORY:  Penicillin causes respiratory distress.  IVP dye, Phenergan, morphine, and atropine have adverse reactions.  MEDICATIONS:  1. Darvocet-N 100.  2. Aspirin 81 mg daily.  3. Claritin D b.i.d.  4. Lanoxin 0.125 mg every other day alternating with 0.375 mg every other day.  5. Niferex 150 mg daily.  6. Demadex 40 mg daily.  7.  Cozaar 50 mg daily.  8. Aldactone 25 mg b.i.d.  9. Lopressor 25 mg q.a.m., 25 mg at noon, and 50 mg h.s. 10. Nitroglycerin p.r.n. 11. Ambien 10 mg one half to one tablet h.s. p.r.n. 12. Prilosec 20 mg daily for GERD. 13. Celexa 30 mg daily for depression.  PAST MEDICAL HISTORY:  History of superficial phlebitis in the right lower extremity in 1996.  History of pyelonephritis in November 1999.  Incarcerated umbilical hernia in May 1999.  Infected wound related to incarcerated umbilical  hernia surgery in June 1999.  Pacemaker insertion for sick sinus syndrome in January 1999.  Tonsillectomy and adenoidectomy in 1955.  In 1977, she had an ectopic pregnancy requiring surgery.  In 1990, she had a partial left nephrectomy. She had a cardiac catheterization in August 2000 which was within normal limits.  SOCIAL HISTORY:  She is married, no children.  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  No alcohol or smoking history.  FAMILY HISTORY:  Father died at age 70, electrocuted in an accident.  Mother died at age 44 of an MI.  One brother with history of coronary artery disease, congestive heart failure status post CABG x 2.  One  sister in good health.  REVIEW OF SYSTEMS:  The patient has chronic fatigue and recent onset of dyspnea on exertion.  She has a history of psoriasis and insomnia.  PHYSICAL EXAMINATION:  VITAL SIGNS:  Temperature 100.5 degrees orally, blood pressure 123/63, pulse 76, respiratory rate 26.  SKIN:  Clammy.  NODES:  None.  HEENT:  Head is normocephalic and atraumatic.  Sclerae and conjunctivae are clear. Pharynx is clear.  NECK:  Supple without thyromegaly.  No JVD or carotid bruits.  CHEST:  Bilateral rales and wheezing noted.  CARDIAC:  Regular rate and rhythm, normal S1 and S2.  ABDOMEN:  Bowel sounds are active.  Abdomen is obese, soft, nondistended, with o hepatosplenomegaly, masses, or tenderness.  EXTREMITIES:  Trace  edema, nonpitting.  NEUROLOGIC:  No focal deficits.  HOSPITAL COURSE:  The patient was admitted to a telemetry ward after spending the evening in the emergency department because of no availability of telemetry beds. Her rhythm remained stable throughout her entire hospitalization with her pacemaker.  Her blood pressure remained stable, as well.  It did have some elevation from time to time, and that seemed to be due to emotional irritation rom nursing staff.  She was placed on IV antibiotics consisting of Levaquin 500 mg V daily.  She was given IV Zofran for nausea because Phenergan is not tolerated.  Pulse oximetry was ordered p.r.n. and she was given IV fluids consisting of D-5  half normal saline with 20 mEq of potassium chloride per liter.  She was maintained on the same cardiac medications.  She was given subcutaneous heparin because we  thought she was at high risk for pulmonary embolus.  Her entire hospital course was stable and she continued to improve gradually over the next three or four hospital days with decreased wheezing.  She had considerable amounts of dry cough during her hospitalization causing chest wall pain.  She was given IV Solu-Medrol at onset to help with asthmatic bronchitis-type symptoms. She said she had "hallucinations" on IV steroids and these were subsequently changed to prednisone and thereafter Medrol.  Currently, she is on Medrol 24 mg daily, but  will be decreasing to 16 mg daily.   Dr. Fannie Knee saw her in consultation and felt she probably had bilateral viral pneumonia.  However, we proceeded to treat her with antibiotics and she became much better and subsequent chest x-rays improved.  Her last chest x-ray prior to admission showed clearing of infiltrates entirely.  It was felt that the patient was not stable enough to go home after  few days of IV therapy and hospitalization.  It is noted that she has a poor support system in her  husband.  She had easy fatigability and would attempt to alk  around the halls and have to stop and rest.  We did order home oxygen for her at 1.5 L per minute and a home nebulizer machine for albuterol inhalation treatments. A Serevent inhaler was also added to her medical regimen, two sprays p.o. b.i.d. and also Fenesin 600 mg p.o.  b.i.d. for one month only.  The patient will be continued on Levaquin for an additional seven days at time of discharge.  She will taper her Medrol by one tablet every five days over the next three or four weeks. She was advised to see me two weeks after discharge for follow-up.  She had previously been scheduled to see Dr. Lina Sar for evaluation of possible irritable bowel syndrome and that will be  rescheduled in the near future. Subcutaneous heparin has been discontinued since she is being discharged and is  more active.  Therefore, the patient is discharged home on the following medications in good condition.  DISCHARGE MEDICATIONS:  1. Levaquin 500 mg daily for an additional seven days.  2. Fenesin 600 mg p.o. b.i.d. for one month.  3. Medrol 16 mg four tablets daily, taper by one tablet every five days until     discontinued.  4. Darvocet-N 100 q.4h. p.r.n. pain.  5. Lanoxin 0.375 mg alternating every other day with 0.25 mg every other day.  6. Aspirin 81 mg daily.  7. Demadex 40 mg daily.  8. Aldactone 25 mg daily q.12h.  9. Lopressor 25 mg q.a.m., 25 mg at noon, and 50 mg of Lopressor at bedtime. 10. Cozaar 50 mg daily. 11. Celexa 30 mg daily. 12. Prilosec 20 mg daily. 13. Albuterol nebulizer up to q.i.d. p.r.n. and a Serevent inhaler two sprays every     12 hours.  LABORATORY STUDIES:  When the patient arrived in the emergency department, she ad a PO2 on room air of 54 which was subsequently corrected for body temperature and was reported as 59.  White blood cell count was within normal limits and hemoglobin was stable.  She  does have a history of iron deficiency anemia and is maintained on Niferex for that.  Blood cultures were obtained after the patient was seen in consultation by Dr. Fannie Knee, pulmonologist.  These proved to be negative.  DISCHARGE INSTRUCTIONS:  The patient was advised to take it easy about the house with no heavy housework over the next two to four weeks.  She is to see me in two weeks in follow-up and is to call the office for appointment.  She should reschedule her GI appointment with Dr. Lina Sar in the near future. DD:  02/04/00 TD:  02/04/00 Job: [id number]

## 2011-05-18 NOTE — H&P (Signed)
[place]  Patient:    [name]                     MRN: [id number] Adm. Date:  [id number] Attending:  Sharlet Salina CC:         Fannie Knee, M.D., Medicine             Caprice Kluver, M.D., Cardiology             Lina Sar, M.D., Gastroenterology             Avel Peace, M.D., Surgery                         History and Physical  CHIEF COMPLAINT:  "I dont feel good."  HISTORY:  [age]-year-old white female with morbid obesity, sleep apnea, hypertension, pacemaker for history of sick sinus syndrome, fatigue and depression, was seen in my office 01/16/00 with a respiratory infection and temperature of 100 degrees orally.  She was treated with oral Ceftin for 10 days.  She subsequently had a chest x-ray, 01/18/00, showing no infiltrate and sinus films, which were negative.  She presented to the office today complaining of a temperature up to 102 degrees, sweats, wheezing, and dyspnea on exertion.  Pulse oximetry in the office was 77%; rales were heard bilaterally; she was sent to the emergency department at [facility] for further evaluation.  PO2 on room air was 54 and chest x-ray showed bilateral patchy infiltrates in the upper and lower lobes.  She has been  vomiting several times since last evening and has been unable to keep down many of her medications, some of which are cardiac medications.  She is admitted for IV  antibiotics, IV fluids, and IV antiemetics.  PAST MEDICAL HISTORY:  ALLERGIES: PENICILLIN cause respiratory distress, IVP DYE, PHENERGAN, MORPHINE, and ATROPINE.  CURRENT MEDICATIONS: 1. Darvocet-N 100 q. 4 hours p.r.n. 2. Aspirin 81 mg daily. 3. Claritin D b.i.d. p.r.n. 4. Lanoxin 0.25 mg every other day alternating with 0.375 mg every other day. 5. Niferex 150 mg daily. 6. Demodex 40 mg daily. 7. Cozaar 50 mg daily. 8. Aldactone 25 mg b.i.d. 9. Lopressor 25 mg q. a.m., 25 mg at noon, 50 mg  h.s. 10. Nitroglycerin p.r.n. 1/150 sublingual. 11. Ambien 10 mg 1/2 to 1 tablet h.s. p.r.n. 12. CPAP for sleep apnea. 13. Levobid daily for irritable bowel syndrome. 14. Prilosec 20 mg daily for presumed GERD. 15. Celexa 30 mg daily.  PAST MEDICAL HISTORY: 1. Admitted for superficial phlebitis of the right leg in 1996. 2. Pyelonephritis in 11/99. 3. Incarcerated umbilical hernia in 5/99 and subsequently had a severe wound    infection from that surgical incision in 6/99 requiring further surgery. 4. Pacemaker insertion in 7/99 for sick sinus syndrome. 5. Tonsillectomy and adenoidectomy in 1955. 6. Ectopic pregnancy surgery in 1977. 7. Partial left nephrectomy in 1990. 8. Cardiac catheterization in 8/00, which was within normal limits.  SOCIAL HISTORY:  She is married and has no children.  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  No alcohol or smoking history.  FAMILY HISTORY:  Father died at age 13; electrocuted accidentally while working for [employer].  Mother died at age 11 of an myocardial infarction.  One brother with history of coronary artery disease, congestive heart failure, status post coronary artery bypass graft x 2.  One sister in good health.  REVIEW OF SYSTEMS:  Patient has chronic fatigue.  She has had recent onset of dyspnea on exertion, psoriasis, insomnia, and has had recurrent respiratory infections in November, December, and January, respectively.  PHYSICAL EXAMINATION:  VITAL SIGNS:  Temperature 100.5 degrees orally, blood pressure 123/63, pulse 76, respiratory rate 26.  Skin:  Clammy.  Nodes:  None.  HEENT:  Head is normocephalic, atraumatic; sclerae and conjunctivae are clear; Ms are clear; pharynx is clear.  Neck:  Supple without thyromegaly, JVD, or carotid bruits.  Chest: Bilateral rales and wheezing noted.  Cardiac:  Regular rate and rhythm, normal S1, S2, no S3 is appreciated.  Abdomen:  Bowel  sounds active, no hepatosplenomegaly, masses or tenderness.  Extremities:  Trace edema.  Neurologic:  No focal deficits.  IMPRESSION: 1. Bilateral pneumonia, bronchospasm, and hypoxia (She had Pneumovax    immunization 01/08/00). 2. Hypertension. 3. Sleep apnea and insomnia. 4. Morbid obesity. 5. Pacemaker for sick sinus syndrome with history of frequent palpitations. 6. Recurrent respiratory infections. 7. GERD. 8. Irritable bowel syndrome. 9. Chronic fatigue. 10. Depression. 11. Musculoskeletal pain.  PLAN:  Patient will be given IV fluids, IV Zofran for nausea, and IV Levaquin. She will be given oxygen and monitored on telemetry.  Will receive Ventolin nebulizer treatments and will prophylax for DVT with subcutaneous heparin. DD:  01/25/00 TD:  01/25/00 Job: [id number]

## 2011-05-18 NOTE — Discharge Summary (Signed)
[name], [name]             ACCOUNT NO.:  [id number]   MEDICAL RECORD NO.:  [id number]          PATIENT TYPE:  INP   LOCATION:  4730                         FACILITY:  [facility]   PHYSICIAN:  [name], M.D. DATE OF BIRTH:  May 07, 1950   DATE OF ADMISSION:  09/22/2006  DATE OF DISCHARGE:  10/01/2006                                 DISCHARGE SUMMARY   PRIMARY CARE PHYSICIAN:  [name], M.D.   PRIMARY CARDIOLOGIST:  [name], M.D.   FINAL DIAGNOSES:  1. Left leg cellulitis secondary to possible spider bite.  2. Chest pain and shortness of breath.  3. History of paroxysmal atrial fibrillation.  4. Sick sinus syndrome status post pacemaker placement.  5. Nonischemic cardiomyopathy with ejection fraction of 35%.  6. Morbid obesity.  7. Diabetes mellitus.  8. Anemia.  9. 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   BRIEF HISTORY:  [name] is a pleasant 61 year old white female who was  admitted on September 22, 2006 from the emergency department with unilateral  lower extremity edema after what was thought to be a possible spider bite.  She presented with fever and chills as well as tachycardia and her leg was  erythematous with edema.  She was given IM Rocephin and p.o. Levaquin,  however her symptoms worsened and she experienced increasing shortness of  breath.  She was admitted for treatment of the left leg cellulitis as well  as to rule out PE and DVT given her shortness of breath and chest  discomfort.  On admission, her INR was 1.7.   HOSPITAL COURSE:  [name] was admitted to 4700 in stable condition.  ID  consult was obtained.  Dr. Roxan Hockey saw her and recommended IV  Clindamycin  and IV Vancomycin.  A VQ scan was done to rule out PE and lower extremity  Dopplers were done to rule out DVT, both of these returned negative and she  continued with her antibiotics.  Her INR remained therapeutic and we  continued to follow her along.  Blood cultures were  obtained and after five  days there was no growth.  Once IV antibiotics were initiated, she continued  to improve, however there was a slightly daily decline in her hemoglobin;  therefore, iron studies were performed which revealed a low iron sat at 16%.  Cardiac wise, she remained stable, she received a total of 14 days of  antibiotics and on September 30, 2006, her Vancomycin was changed to p.o.  Clindamycin and it was recommended that she continue this for six days, 300  mg q.i.d.  ID also recommended treatment of chronic psoriasis and restarting  compressive knee-high stockings once cellulitis was resolved in order to  decrease the chances of recurrent cellulitis.  On the day of discharge, she  has no complaints.  Her left lower extremity is much improved with very  minimal blister formation.  She is on the monitor and is paced.  She denies  any chest pain or shortness of breath and no palpitations and she is deemed  ready for discharge.   DISCHARGE INSTRUCTIONS:  She is to  follow a heart-healthy diet, low sodium.   ACTIVITY:  She may increase her activity slowly and may shower and bathe.   DISCHARGE MEDICATIONS:  Include:  1. Glucophage 500 mg two daily.  2. Digoxin .375 mg daily.  3. K-Dur 20 mEq two daily.  4. Demodex 50 mg twice daily.  5. Spironolactone 25 mg twice daily.  6. Glucotrol 5 mg b.i.d.  7. Metoprolol 50 mg b.i.d.  8. Coumadin 5 mg alternating with 7.5 mg on Tuesday, Thursday.  9. Synthroid 50 mEq daily.  10.Cymbalta 60 mg daily.  11.Darvocet p.r.n.  12.Nitropatch .4 mg daily.  13.Allegra 60 mg b.i.d.  14.Spiriva capsule daily.  15.Amiodarone 200 mg daily.  16.Clindamycin 300 mg four times daily for six days.  17.Slow-Fe daily.   FOLLOWUP APPOINTMENT:  She is to return to see Dr. Clarene Duke in six weeks, our  office will contact her with a date and time.  She is to return to see Dr.  Lenord Fellers in two weeks, she is to call and schedule that appointment.      ______________________________  Charmian Muff, NP    ______________________________  [name], M.D.    LS/MEDQ  D:  10/01/2006  T:  10/01/2006  Job:  [id number]   cc:   [name], M.D.

## 2011-05-18 NOTE — Cardiovascular Report (Signed)
Cynthia [name], [name]             ACCOUNT NO.:  [id number]   MEDICAL RECORD NO.:  [id number]          PATIENT TYPE:  OIB   LOCATION:  2899                         FACILITY:  [facility]   PHYSICIAN:  Cynthia [name], M.D. DATE OF BIRTH:  [birth year]   DATE OF PROCEDURE:  02/27/2006  DATE OF DISCHARGE:                              CARDIAC CATHETERIZATION   This 61 year old female has hypertension and is markedly obese.  She has had  several catheterizations in the remote past all of which have shown no  coronary disease.  She had a Cardiolite study January of 2006 that was  negative for ischemia but she continues to take multiple nitroglycerin on a  daily basis because of chest pain.  She has a pacemaker in for symptomatic  bradycardia and complains of continued problems with multiple episodes of  palpitations, usually occurring at night.  Because of the continued nitrate  use I have decided to bring her in for repeat cardiac catheterization to  resolve this issue once and for all.   After obtaining informed consent, the patient was prepped and draped in the  usual sterile fashion exposing the right groin.  Following local anesthetic  with 1% Xylocaine the Seldinger technique was employed a 5-French introducer  sheath was placed into her right femoral artery.  Left and right coronary  arteriography and ventriculography in the RAO projection x2 and a distal  aortogram was performed.   COMPLICATIONS:  None.   TOTAL CONTRAST USED:  140 mL.    1.  Hemodynamic monitoring.  Central aortic pressure was 115/55.  The left      ventricular pressure was 118/21 and there was no significant valve      gradient noted at the time of pullback.  2.  Ventriculography:  Ventriculography in the RAO projection was performed.      The first ventriculogram the catheter was underneath the aortic valve      and I could not adequately opacify the distal left ventricle.  After      repositioning the catheter a  second ventriculogram using 25 mL of 15      mL/second was performed.  This revealed the left ventricular cavity to      be dilated.  There was mild to moderate global hypokinesis with the      distal septum, apex, and inferior wall being more hypokinetic.  The      anterior and basilar segments showed slightly diminished contractility.      Left ventricular end-diastolic was 26.  The calculated ejection fraction      was 36 and it appeared to be in the 35-40 range to me.  3.  Distal aortogram:  Distal aortogram done above the level of the renals      showed no evidence of abdominal aortic aneurysm or iliac disease.  I      could only visualize the collecting system on the right side.  I could      not see a caliceal on the left.  There was no evidence of renal artery      stenosis but the  renal arteries were relatively small in diameter.   CORONARY ARTERIOGRAPHY:  1.  The left main was about 5 mm in diameter and bifurcated.  It was free of      disease.  2.  Circumflex:  The circumflex gave rise to two very large OM vessels all      of which were free of disease.  3.  Optional diagonal:  This came off the most proximal portion of the LAD      and was widely patent.  4.  LAD:  The LAD extended down and barely crossed the apex of the heart.      It gave rise to a moderate sized first diagonal vessel and this entire      system was free of disease.  5.  Right coronary artery:  The right coronary artery was a dominant vessel      giving rise to a small PDA and a posterolateral branch.  This was the      smallest of all three coronary arteries, but was widely patent.   CONCLUSION:  1.  No evidence of coronary artery disease.  2.  No abdominal aortic aneurysm.  3.  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  4.  Idiopathic cardiomyopathy with ejection fraction of around 35-40%.  5.  Dilatation of the left ventricle.   The patient will be discharged to home today  and will be continued on  medical therapy for an idiopathic cardiomyopathy.  She does have a permanent  pacemaker in place and the leads appeared to be in appropriate location on  fluoroscopy.   I have nothing from an anatomy standpoint to correlate with her requirements  for continued use of nitrates.           ______________________________  Cynthia Solo [name], M.D.     ABL/MEDQ  D:  02/27/2006  T:  02/27/2006  Job:  [id number]   cc:   Cynthia [name], M.D.  Fax: [phone]   Cynthia [name], M.D.  Fax: [phone]

## 2011-05-18 NOTE — Op Note (Signed)
[name], [name]             ACCOUNT NO.:  [id number]   MEDICAL RECORD NO.:  [id number]          PATIENT TYPE:  OIB   LOCATION:  2550                         FACILITY:  [facility]   PHYSICIAN:  Nadara Mustard, MD     DATE OF BIRTH:  1950/08/16   DATE OF PROCEDURE:  02/05/2007  DATE OF DISCHARGE:                               OPERATIVE REPORT   PREOPERATIVE DIAGNOSIS:  Internal derangement right knee.   POSTOPERATIVE DIAGNOSIS:  Medial and lateral meniscal tear,  osteochondral defect of the medial femoral condyle, lateral femoral  condyle, patella and trochlea.   PROCEDURE:  Right knee arthroscopy with partial medial and lateral  meniscectomies, abrasion chondroplasty medial femoral condyle, lateral  femoral condyle, patella and trochlea.   SURGEON:  Nadara Mustard, M.D.   ANESTHESIA:  General.   ESTIMATED BLOOD LOSS:  Minimal.   ANTIBIOTICS:  None.   DRAINS:  None.   COMPLICATIONS:  None.   DISPOSITION:  To PACU in stable condition.   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  She has failed  conservative care.  She has undergone of conservative treatment with  activity modifications, anti-inflammatories, weight loss as well as  steroid injections and due to failure of conservative care with  persistent mechanical symptoms the patient wished to proceed with  surgical intervention.  Risks and benefits were discussed including  infection, neurovascular injury, persistent pain, need for additional  surgery.  The patient states she understands and wishes to proceed at  this time.   DESCRIPTION OF PROCEDURE:  The patient was brought to OR room 4 and  underwent a general anesthetic.  After adequate level of anesthesia was  obtained, the patient's right lower extremity was prepped using  Hibiclens and draped into a sterile field.  The scope was inserted  through the inferolateral portal and  inferomedial working portal was  established.  Visualization showed a large degenerative tear of the  medial meniscus.  This was debrided with both the shaver and the biter.  The patient had a large osteochondral defect of the medial femoral  condyle and this was debrided with the shaver with abrasion  chondroplasty.  Examination of notch showed an intact ACL.  Examination  of the lateral joint line in a figure-of-four position showed a  degenerative bucket-handle tear of the lateral meniscus.  This was  debrided with the shaver and the biter.  She also had osteochondral  defect of the lateral femoral condyle which was also debrided.  Examination of the patellofemoral joint with the knee extended showed  osteochondral defect of the patella and trochlea.  The patient underwent  abrasion chondroplasty in the patellofemoral joint as well.  Survey of  all three compartments again showed no loose bodies.  The instruments  were removed.  The portals were closed using 4-0 nylon.  The joint was  injected with a total 20 cc of 0.5% Marcaine plain.  The wounds were  covered with Adaptic orthopedic sponges, ABD dressing, Webril and a  Coban dressing.  The patient was extubated  and taken to the PACU in  stable condition.  Plan for 23-hour observation and discharge in the  morning.      Nadara Mustard, MD  Electronically Signed     MVD/MEDQ  D:  02/05/2007  T:  02/05/2007  Job:  [id number]

## 2011-05-18 NOTE — Cardiovascular Report (Signed)
NAMEJOHARA, LODWICK             ACCOUNT NO.:  [id number]   MEDICAL RECORD NO.:  [id number]          PATIENT TYPE:  OIB   LOCATION:  2887                         FACILITY:  [facility]   PHYSICIAN:  Darlin Priestly, MD  DATE OF BIRTH:  1950/12/12   DATE OF PROCEDURE:  03/02/2005  DATE OF DISCHARGE:                              CARDIAC CATHETERIZATION   PROCEDURES:  1.  St. Jude pacemaker explant.  2.  Lead interrogation.  3.  Insertion of a Medtronic EnRhythm model number [id number], serial number      [id number].   ATTENDING PHYSICIAN:  Darlin Priestly, M.D.   COMPLICATIONS:  None.   INDICATIONS:  Ms. Gulden is a 61 year old female patient of Dr. Caprice Kluver  with a history of paroxysmal atrial ablation, history of sick sinus syndrome  status post dual-chamber pacemaker implant in 1999 by Dr. Charlies Constable using  a Pacesetter Trilogy DR generator.  The patient has recently reached ERI.  She is now referred for generator change out.   DESCRIPTION OF OPERATION:  After obtaining informed written consent, the  patient was brought to the cardiac catheterization laboratory.  Right  anterior chest was prepped and draped in sterile fashion.  ECG monitor was  established.  1% lidocaine was used to anesthetize over the previously  placed generator.  Next, a vertical incision was then carried out along side  her previous incision and hemostasis was obtained with electrocautery.  Blunt dissection was used to carry this down to the pacer pocket.  Again,  hemostasis was obtained with electrocautery.  The pacer capsule with then  opened with scalpel and the generator was then freed.  The generator was  then explanted from the pocket.  Again, this was a Fish farm manager DR plus,  model number 2364, serial number [id number].  The atrial lead was then removed  and thresholds were  determined.  P waves were measured at 1.3 mV.  Threshold in the atrium was 1 V at 0.5 msec.  Current was 2.9 mA.   Impedance  was 359 ohms.  This lead was then left connected the Medtronic interrogation  device.  The ventricular lead was then removed from the header and the pacer  was then transferred off the field.  Thresholds were interpreted in the V  lead.  R waves were measured at 5.7 mV.  Threshold in the ventricle was 1 V  at 0.5 msec.  Current was 3.0 mA. Impedance was 405 ohms.  The pocket was  then inspected.  Again, hemostasis was obtained with electrocautery.  Pocket  was then copiously irrigated with 1% kanamycin solution.  There continued to  be a slow ooze in the pocket and we did irrigate the area with 5000 units of  liquid thrombin.  The atrial and ventricular leads were then connected in a  serial fashion to the header and the head screws were tightened and pacing  was confirmed.  The pacer was then delivered into the pocket.  Silk suture  was then placed in the apex of the pocket, and the header was then secured  to the pectoral  fascia.  The pacer was then closed using running 2-0 Vicryl  for the subcutaneous layer in two different layers.  The skin layer was then  closed using running 5-0 Vicryl.  Steri-Strips were applied, and the patient  transferred to the recovery room in stable condition.   CONCLUSION:  1.  Successful explant of a St. Jude Trilogy DR model number 2364, serial      number [id number] generator.  2.  Successful lead interrogation.  3.  Implant of a Medtronic EnRhythm [id number] generator, serial number      [id number].      RHM/MEDQ  D:  03/02/2005  T:  03/02/2005  Job:  [id number]   cc:   [name], M.D.  [address]  [address]  [place]  [place] [address]  Fax: [phone]   [name], M.D.  [address], [address]  [place]  [place] [address]  Fax: [phone]

## 2011-05-18 NOTE — Discharge Summary (Signed)
[name], [name]             ACCOUNT NO.:  [id number]   MEDICAL RECORD NO.:  [id number]          PATIENT TYPE:  OIB   LOCATION:  6523                         FACILITY:  [facility]   PHYSICIAN:  [name], M.D. DATE OF BIRTH:  September 20, 1950   DATE OF ADMISSION:  02/27/2006  DATE OF DISCHARGE:  02/28/2006                                 DISCHARGE SUMMARY   DISCHARGE DIAGNOSES:  1.  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  2.  Known paroxysmal atrial fibrillation.  3.  Sick sinus syndrome status post permanent transvenous pacemaker.  4.  Diabetes mellitus.  5.  Obstructive sleep apnea.   Patient is a 61 year old female patient of Dr. Clarene Duke, who was seen in our  office recently with complaints of recurrent chest pain relieved with  nitrites, and Dr. Clarene Duke made the decision to schedule patient for coronary  angiography for definitive diagnosis. She was admitted on February 27, 2006,  for coronary angiography.   HOSPITAL PROCEDURES:  Coronary angiography performed by Dr. Clarene Duke on  February 27, 2006. Cath revealed normal left main, which bifurcates.  Circumflex was free of disease and gave rise to 2 large obtuse marginal  branches. LAD was free of disease. RCA was also patent. The left ventricle  was dilated with mild to moderate global hypokinesis. Ejection fraction  calculated at 36%, end diastolic pressure 26. Distal abdominal aorta  revealed no abdominal aortic aneurysm and no iliac disease, but only 1 right  renal artery was visualized. Patient was free of coronary disease and  required medical therapy for idiopathic cardiomyopathy.   Patient was transferred to the unit post cath in stable condition. The next  morning was assessed by Dr. Jenne Campus. Her groin site was free of any  ecchymosis or hematoma. No oozing was noted. Patient was ready for discharge  home.   HOSPITAL LABORATORIES:  No blood work was performed post catheterization.   DISCHARGE  DIET:  Low-salt, low-fat, low-cholesterol diet.   DISCHARGE ACTIVITIES:  No driving for 3 days post cath, no lifting greater  than 5 pounds, increasing activity slowly, stop any activities that could  cause pain, chest pain, shortness of breath, tiredness or sweating. Patient  was instructed not to use a bathtub, but take a shower.   DISCHARGE MEDICATIONS:  1.  Diltiazem 180 mg b.i.d.  2.  Digoxin 0.375 mg q.d.  3.  Lopressor 50 mg a day.  4.  Lisinopril 50 mg q day.  5.  Aldactone 25 mg b.i.d.  6.  Levothyroxine 50 mcg q.d.  7.  Nitroglycerin 0.4 mg sublingual.  8.  Demadex 50 mg a day.  9.  Byetta 5 mcg subcutaneous twice daily before meals.  10. Folic acid 1 mg q day.  11. Amiodarone 200 mg q.d.  12. Spiriva 10 mcg daily.  13. Glucotrol 10 mg twice daily.  14. Zaroxolyn 5 mg p.r.n.  15. Glucophage XR 500 mg b.i.d. Patient was allowed to resume Glucophage on      March 02, 2006.  16. Cymbalta 60 mg q day.   DISCHARGE FOLLOW-UP:  Dr. Clarene Duke will see patient  on March 04, 2006, at 4:00  p.m.      Raymon Mutton, P.A.    ______________________________  [name], M.D.    MK/MEDQ  D:  02/28/2006  T:  02/28/2006  Job:  [id number]   cc:   [facility]

## 2011-05-18 NOTE — Cardiovascular Report (Signed)
NAME:  [name], [name]                       ACCOUNT NO.:  [id number]   MEDICAL RECORD NO.:  [id number]                   PATIENT TYPE:  OIB   LOCATION:  2899                                 FACILITY:  [facility]   PHYSICIAN:  [name], M.D.              DATE OF BIRTH:  [birth year]   DATE OF PROCEDURE:  01/27/2004  DATE OF DISCHARGE:  01/27/2004                              CARDIAC CATHETERIZATION   PROCEDURE:  Cardioversion.   CARDIOLOGIST:  [name], M.D.   INDICATIONS:  Ms. Molinari is a 61 year old female who has a pacemaker for  sick sinus syndrome.  She has been in atrial fibrillation and her pacemaker  has been appropriately changed to VVI.  Because of the underlying atrial  fibrillation, arrangements were made for her to come in for elective  outpatient cardioversion.  She complained of worsening tiredness and fatigue  after she lost her sinus mechanism.   DESCRIPTION OF PROCEDURE:  After obtaining informed consent, the patient was  given 150 mg of IV Diprivan by anesthesia.  After an appropriate level of  anesthesia was obtained, the patient underwent elective cardioversion.   Anterior posterior paddles were used and 360 watt seconds biphasic were used  and with the single shock, the patient converted from underlying atrial  fibrillation to sinus rhythm.  She has no sequela from the cardioversion.  She is now alert and oriented.  She will be discharged to home later this  afternoon and follow up in my office next week.                                               [name], M.D.    ABL/MEDQ  D:  01/27/2004  T:  01/28/2004  Job:  [id number]   cc:   [name], M.D.  [address]  [place]  [place] [address]  Fax: [phone]

## 2011-05-18 NOTE — Consult Note (Signed)
[place]  Patient:    [name], [name]                    MRN: [id number] Proc. Date: 05/15/00 Adm. Date:  [id number] Attending:  Sharlet Salina Dictator:   Nathen May, M.D. [facility] CC:         Gaspar Garbe B. Little, M.D.             [name], M.D.                          Consultation Report  Thank you very much for asking me to see Ms. Lora Paula in an electrophysiological consultation for palpitations in a setting of prior pacemaker implantation.  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  In 1999 when she was hospitalized for an incarcerated hernia she was found to have a sinus node dysfunction with a junctional escaper, some bradycardia, and pauses up to 2-1/2 seconds. Apparently, there were some out of hospital pauses that were somewhat greater than this, and also a history of out of hospital tachy palpitations with a palpated pulse of 150 or so.  Because of this "tachy-brady" she underwent pacemaker implantation by Dr. Charlies Constable receiving a Trilogy pulse generator in July 1999.  Since that time she has had continued problems with palpitations. Unfortunately, we have no documentation in the hospital records.  These include tachy palpitations, references to extra beats from the upper and lower chamber.  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  This was even at heart rates of only 80 beats per minute, and felt not to be a candidate for AAI pacing, and she was left DDD pacing.  There have also been a number of other pacemaker interventions undertaken by Dr. Clarene Duke and Kern Alberta for reprogramming, which have also been unassociated with protractive benefit.  A variety of medications have been tried including  atenolol, Lopressor, Verapamil, and Lanoxin, each of which has had some benefit, and then there has been breakthrough.  Her palpitations are more notable at night when she is recumbent.  They are more notable on the left side.  She feels them as a flipping over of her heart and her throat, as well as a pounding in her ears.  She is hospitalized now for thrombophlebitis and conization.  She was started on propafenone 150 mg t.i.d. for this, and has had a fairly quiet monitor, but she continues to have some symptoms.  Her cardiac evaluation in the past has included a catheterization undertaken last summer demonstrating normal coronary arteries and normal left ventricular function.  There is some question about whether she may have pulmonary hypertension.  This catheterization was undertaken because of a borderline abnormal Cardiolite scan.  She also has obstructive sleep apnea and uses CPAP and home O2.  She has in the past desaturated with exercise.  PAST MEDICAL HISTORY: 1. Hypertension. 2. Depression. 3. Pyelonephritis.  PAST SURGICAL HISTORY: 1. Incarcerated umbilical hernia that was subsequently infected in the spring    of 1999. 2. Partial nephrectomy. 3. T&A.  MEDICATIONS ON ADMISSION:  Darvocet-N 100, aspirin, Claritin, Lanoxin, Niferex, Demadex, Celexa, home O2, Cozaar, potassium, Aldactone, Ambien, Serevent, albuterol, Rythmol.  ALLERGIES:  PENICILLIN,  IV DYE, PHENERGAN, MORPHINE, AND ATROPINE.  SOCIAL HISTORY:  She is married.  She is a former Engineer, civil (consulting) at [employer].  She is now disabled.  She has no children.  She does not use cigarettes or alcohol.  FAMILY HISTORY:  Noncontributory.  REVIEW OF SYSTEMS:  Noncontributory.  PHYSICAL EXAMINATION:  VITAL SIGNS:  Blood pressure 108/70, pulse 70.  Respirations are 14 and unlabored.  GENERAL:  She is a morbidly obese Caucasian female laying flat in bed.  HEENT:  No scleral icterus, no  xanthomatosis.  NECK:  Her neck veins were unappreciable.  Her carotids were brisk bilaterally without bruits.  BACK:  Without kyphosis or scoliosis.  LUNGS:  Clear.  HEART:  Sounds were regular without murmurs, rubs, or gallops appreciated.  ABDOMEN:  Obese, there was no midline pulsation.  Previously, a pulsatile liver was not appreciated.  Distal pulses were intact.  EXTREMITIES:  There is no clubbing or cyanosis.  There was some mild peripheral edema.  LABORATORY DATA:  Electrocardiogram is not available, but presumably would show AV pacing.  Interrogation of her pacesetter Trilogy pulse generator demonstrates an R-wave of 5, a P-wave of 0.75, atrial pacing threshold was one volt at 0.4 milliseconds, and ventricular pacing threshold was 1.5 at 0.6 milliseconds. The battery voltage was 2.77 with an atrial impendance of 371 and ventricular lead impedance of 376.  Heart rate histograms demonstrate a reasonable heart rate spectrum.  Notably, however, she has 8% beats in the PVE catergory, which typically identifies premature ventricular activity.  She also had 45 Mobitz episodes all of which were less than 6 minutes, and 39 of which were less than 52 seconds.  The measured atrial tachycardia with these ranged from 150 to 300 milliseconds, and this was timed over 155 days.  IMPRESSION:  1. Palpitations, question cause.  History of tachycardia without     documentation available.  Premature ventricular contractions and premature     atrial contractions.  Pacemaker histograms demonstrating 8% PVE, i.e.     ventricular ectopic activity, and 45 episodes of "atrial tachycardia, all     less than 6 minutes".  2. Sinus node dysfunction with junctional escape and bradycardia.  3. Status post pacer for #2 with the evidence given in #1.  4. Paroxysmal atrial fibrillation on at least one occasion, documented in     1999.  5. Propafenone therapy for #1 with subsequent improvement with  prior     ineffective therapy of atenolol, Lopressor, Verapamil, the patient also      takes Lanoxin at present.  6. Normal coronary arteries and normal left ventricular function.  7. Superficial thrombophlebitis, now on Coumadin.  8. Morbid obesity.  9. Obstructive sleep apnea, on CPAP and O2. 10. Hypertension.  DISCUSSION:  Mrs. Johanning has pacemaker implantation for sinus node dysfunction and bradycardia.  She also has concominant AV conduction system disease with intrinsic conduction at 250+ milliseconds and Wenckebach with heart rates of 80 to 100 beats per minute.  The major issue is what is contributing to her palpitations.  It is not clear to me what is doing this, whether it is the high density of ventricular ectopic activity which the pacemaker suggests, whether it may be some of this atrial tachycardia, or whether there are in fact no correlated arrhythmias.  The major task will be trying to correlate the symptoms with a specific arrhythmia or pacemaker malfunction.  The pacemaker appears to be functioning normally, atrial sensitivity is modestly low, and so  there may be some potential for atrial under sensing with subsequent ventricular pacing and VA conduction.  RECOMMENDATIONS:  Based on the above I have therefore instructed her in the use of her telemetry event recorded.  In the event that this is unrevealing, I would recommend that we undertake the use of an outpatient event recorder whenever it is that her symptoms recur.  We can redose her propafenone at 225 mg b.i.d., and anticipate a similar affectiveness.  Thank you for this consultation. DD:  05/15/00 TD:  05/15/00 Job: [id number]

## 2011-05-18 NOTE — Assessment & Plan Note (Signed)
[facility]                             PULMONARY OFFICE NOTE   NAME:SIMMONSDelcia, [name]                    MRN:          [id number]  DATE:11/26/2006                            DOB:          12/26/1950    PULMONARY FOLLOWUP   PROBLEM:  1. Obstructive sleep apnea.  2. Hypertension.  3. Pulmonary hypertension.  4. Morbid obesity.  5. Pacemaker/cardiomyopathy/paroxysmal atrial fibrillation.  6. Diabetes.   HISTORY:  She developed a rash after clindamycin in early October, and I  am adding that to her allergy list.  She continues CPAP at 10 CWP and  does not seem to have problems with that.  She noted easier exertional  dyspnea this fall just walking across the room.  She is losing weight.  Remains on amiodarone for her arrhythmias.  Complains of frequent  rhinorrhea which is watery and annoying but without sneeze or anything  purulent.  She will have some cough and white phlegm early in the  mornings.  Iron-deficiency anemia is being treated.   MEDICATION:  1. Glucophage 500 mg twice daily.  2. Digoxin 375 mcg.  3. K-Dur mEq x2.  4. Demadex 50 mg b.i.d.  5. Spironolactone 25 mg b.i.d.  6. Cardizem LA 180 mg b.i.d.  7. Metoprolol 50 mg b.i.d.  8. Coumadin.  9. Zaroxolyn 5 mg p.r.n.  10.Synthroid 50 mcg.  11.Cymbalta.  12.Darvocet.  13.Nitroglycerin patch 0.4 mg.  14.Allegra 60 mg b.i.d. p.r.n.  15.Spiriva once daily.  16.Amiodarone 200 mg.  17.Lisinopril 5 mg.  18.Byetta 5 subcutaneous b.i.d.   DRUG INTOLERANT OF MORPHINE, PHENERGAN, BETADINE, IVP DYE, ALL FISH,  TAPE, BENADRYL, CLINDAMYCIN, PENICILLIN, AND ULTRAM.   OBJECTIVE:  She may have lost some weight.  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  There is stasis type dermatitis for  about 4 inches above each ankle and at least a little pitting.  Her lungs are quiet and clear.  She is not coughing here and not  wheezing.  There is no stridor.  Pulse is regular.   IMPRESSION:  Obstructive sleep apnea is controlled.  She is saturating  well and I cannot detect an active pulmonary issue today.  Her  rhinorrhea is nonspecific possibly allergic.   PLAN:  1. Chest x-ray.  2. Try ipratropium 0.06% nasal spray once or twice each nostril t.i.d.      p.r.n. for rhinorrhea.  3. We refilled her albuterol.  4. Hopefully she can lose some weight in a constructive way.  5. Schedule a return in 6 months but earlier p.r.n.     Clinton D. Maple Hudson, MD, Tonny Bollman, FACP  Electronically Signed    CDY/MedQ  DD: 11/30/2006  DT: 12/02/2006  Job #: [id number]   cc:   [name], M.D.  [name], M.D.  David Stall, M.D.

## 2011-05-18 NOTE — Discharge Summary (Signed)
[facility]  Patient:    [name], [name] Visit Number: [id number] MRN: [id number]          Service Type: MED Location: [phone] Attending Physician:  Loreli Dollar Dictated by:   Adrian Saran, N.P. Admit Date:  04/24/2002 Discharge Date: 04/26/2002   CC:         [name], M.D.   Discharge Summary  DISCHARGE DIAGNOSES: 1. Permanent pacemaker. 2. Hypertension. 3. History of sleep apnea. 4. Morbid obesity. 5. Hyperglycemia.  PROCEDURES:  None.  COMPLICATIONS:  None.  DISCHARGE STATUS:  Stable, improved.  ADMISSION HISTORY:  This is a 61 year old female who has had several day history of orthostatic type symptoms.  She complains of dizziness while standing.  She apparently had a couple of near syncopal episodes where she states she got extremely dizzy, diaphoretic and described visual disturbances. These symptoms lasted approximately one minute and then dissipated on their own.  No actual syncope.  She denies any chest pain, shortness of breath, nausea or awareness of arrhythmias.  She underwent recent cardiac catheterization on April 13, 2002.  She had no CAD and had slightly decreased LV function with EF of 46%.  She was noted to have dilated LV.  She does have known history of palpitations and some arrhythmias including PAF. She has also had notable PVCs and PAVs for which she became very symptomatic.  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  She came to our office on the day of admission with these same symptoms.  When placing her on the monitor she was noted to have a junctional rhythm with standing.  She became, again, symptomatic complaining of dizziness.  Pacemaker was reprogrammed at this point to change AV delay with her experiencing symptoms even when supine.  She was also noted to be somewhat orthostatic in the office with blood pressures going from sitting to standing  of 130/90 to 110/80.  She, therefore, will be admitted for further monitoring on telemetry as well as for the treatment of her orthostatic symptoms with IV fluids and holding all weight controlling and blood pressure medicines.  PHYSICAL EXAMINATION ON ADMISSION:  Alert and oriented x three.  Skin warm and dry.  Normal color and temperature.  Blood pressures as noted above.  Heart rate 70, respirations 20.  She is afebrile.  GENERAL:  Very pleasant individual.  Morbidly obese.  LUNGS:  Clear.  HEART:  Regular rate and rhythm.  ABDOMEN:  Benign.  Normal bowel sounds in all quadrants.  EXTREMITIES:  Lower extremities trace edema bilaterally.  HOSPITAL COURSE:  Patient was admitted for observation.  Her regular outpatient medicines were continued with the exception of Demadex, Aldactone, Norvasc, Rythmol and Tiazac.  No laboratory work was needed.  EKG showed V-paced rhythm.  Pacemaker had been reprogrammed and was functioning properly with good capture.  She remained essentially asymptomatic from the rest of her hospitalization with the exception of one episode of mild dizziness on her second night. Blood pressures remained stable.  However, somewhat on the lower side with systolic in the 110s and diastolic in the 50s.  Heart rate remained stable and paced rhythm in the 70s.  O2 saturations were good.  Blood work did show elevated glucose in the 200s.  Patient improved April 26, 2002.  She was no longer orthostatic with blood pressures lying 100/51 and sitting 118/75 and standing 130/80.  No further problems with pacemaker.  Good capture  was continued.  She was discharged home on April 26, 2002 without incidence.  DISCHARGE MEDICATIONS:  1. Demadex 50 mg p.o. b.i.d.  2. Coumadin 5 mg p.o. q.d.  3. Detrol LA 4 mg q.d.  4. Nitro patch 0.4 mg q.d.  5. Celexa 40 mg q.d.  6. Aldactone 25 mg b.i.d.  7. K-Dur 20 mEq b.i.d.  8. Advair 100/50 b.i.d.  9. Digoxin 0.25 q.d. 10.  Rythmol 225 mg q.8h. 11. Tiazac 120 mg q.d.  DISCHARGE ACTIVITY:  As tolerated.  DISCHARGE DIET:  Low-fat, low-cholesterol, low-salt diet.  RESTRICTIONS:  Watch carbohydrates and starch intake.  She is not to take her Norvasc.  DISCHARGE FOLLOWUP:  She is to call Dr. Clarene Duke for an appointment for follow up in two to three weeks. Dictated by:   Adrian Saran, N.P. Attending Physician:  Loreli Dollar DD:  05/14/02 TD:  05/17/02 Job: [id number]

## 2011-05-21 NOTE — Telephone Encounter (Signed)
OK for 6 months

## 2011-05-23 ENCOUNTER — Ambulatory Visit: Payer: Medicare Other | Admitting: "Endocrinology

## 2011-05-24 ENCOUNTER — Ambulatory Visit: Payer: Medicare Other | Admitting: "Endocrinology

## 2011-05-29 ENCOUNTER — Ambulatory Visit (INDEPENDENT_AMBULATORY_CARE_PROVIDER_SITE_OTHER): Payer: Medicare Other | Admitting: Internal Medicine

## 2011-05-29 ENCOUNTER — Encounter: Payer: Self-pay | Admitting: Internal Medicine

## 2011-05-29 VITALS — BP 110/90 | HR 87 | Ht 66.0 in | Wt 299.5 lb

## 2011-05-29 DIAGNOSIS — I2789 Other specified pulmonary heart diseases: Secondary | ICD-10-CM

## 2011-05-29 DIAGNOSIS — G4733 Obstructive sleep apnea (adult) (pediatric): Secondary | ICD-10-CM

## 2011-05-29 DIAGNOSIS — R0602 Shortness of breath: Secondary | ICD-10-CM

## 2011-05-29 DIAGNOSIS — G4730 Sleep apnea, unspecified: Secondary | ICD-10-CM

## 2011-05-29 NOTE — Assessment & Plan Note (Addendum)
Multifactorial, with obesity, deconditioning and obesity hypoventilation syndrome, heart disease, and maybe some lung disease.  Not wheezing now. Question if resuming home oxygen will help this.

## 2011-05-29 NOTE — Assessment & Plan Note (Signed)
We are going to reassess overnight oximetry which may be pertinent  .

## 2011-05-29 NOTE — Patient Instructions (Signed)
Overnight oximetry on room air plus CPAP for dx of sleep apnea, pulmonary hypertension  We will decide based on this whether to resume O2 at night

## 2011-05-29 NOTE — Progress Notes (Signed)
  Subjective:    Patient ID: [name], female    DOB: 08-Dec-1950, 61 y.o.   MRN: [id number]  HPI 05/29/11- 72 yo morbidly obese, followed for OSA, pulmonary hypertension, complicated by DM, thyroid disease, AFib/pacemaker Was hosp with CHF/ AFIB in December- Dr Caprice Kluver. Improved temporarily, finding  Mitral Regurgitation. Had cath- clean coronaries  and TEE, then PFT and  CT at [facility]. Pulmonary Hypertension. Amiodarone was increased and she converted to NSR. Pacemaker has been replaced.  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 She questions "cardiac asthma". Dyspnea/ chest tightness responds to rescue inhaler, but little wheeze or cough. Today humid weather makes her more short of breath. Continues Spiriva When more restless at night it corresponds to note of more fluid retention next day. We discussed restless sleep as suggesting hypoxia. Uses CPAP all night every night at 9.  Review of Systems Constitutional:   No weight loss, night sweats,  Fevers, chills, fatigue, lassitude. HEENT:   No headaches,  Difficulty swallowing,  Tooth/dental problems,  Sore throat,                No sneezing, itching, ear ache, nasal congestion, post nasal drip,   CV:  No chest pain, orthopnea, PND,  anasarca, dizziness, palpitations  GI  No heartburn, indigestion, abdominal pain, nausea, vomiting, diarrhea, change in bowel habits, loss of appetite  Resp: No excess mucus, no productive cough,  No non-productive cough,  No coughing up of blood.  No change in color of mucus.  No wheezing.    Skin: no rash or lesions.  GU: no dysuria, change in color of urine, no urgency or frequency.  No flank pain.  MS:  No joint pain or swelling.  No decreased range of motion.  No back pain.  Psych:  No change in mood or affect. No depression or anxiety.  No memory loss.      Objective:   Physical Exam General- Alert, Oriented, Affect-appropriate, Distress- none acute  Morbidly obese  Skin- rash-none, lesions- none,  excoriation- none  Lymphadenopathy- none  Head- atraumatic  Eyes- Gross vision intact, PERRLA, conjunctivae clear secretions  Ears- Hearing, canals,- normal  Nose- Clear,  no- Septal dev, mucus, polyps, erosion, perforation   Throat- Mallampati II , mucosa clear , drainage- none, tonsils- atrophic  Neck- flexible , trachea midline, no stridor , thyroid nl, carotid no bruit  Chest - symmetrical excursion , unlabored     Heart/CV- RRR- now regular , no murmur , no gallop  , no rub, nl s1 s2                     - JVD- trace to 1+ ,    Legs- edema and very heavy,           stasis changes- none, varices- none     Lung- clear to P&A, wheeze- none, cough- none , dullness-none, rub- none     Chest wall-  Abd- tender-no, distended-no, bowel sounds-present, HSM- no  Br/ Gen/ Rectal- Not done, not indicated  Extrem- cyanosis- none, clubbing, none, atrophy- none, strength- nl  Neuro- grossly intact to observation         Assessment & Plan:

## 2011-05-30 ENCOUNTER — Encounter: Payer: Self-pay | Admitting: Internal Medicine

## 2011-05-30 NOTE — Assessment & Plan Note (Signed)
Good compliance and control with CPAP now at 10 cwp.

## 2011-06-06 ENCOUNTER — Ambulatory Visit (INDEPENDENT_AMBULATORY_CARE_PROVIDER_SITE_OTHER): Payer: Medicare Other | Admitting: "Endocrinology

## 2011-06-06 VITALS — BP 131/84 | HR 72 | Wt 344.0 lb

## 2011-06-06 DIAGNOSIS — E669 Obesity, unspecified: Secondary | ICD-10-CM

## 2011-06-06 DIAGNOSIS — R609 Edema, unspecified: Secondary | ICD-10-CM

## 2011-06-06 DIAGNOSIS — E049 Nontoxic goiter, unspecified: Secondary | ICD-10-CM

## 2011-06-06 DIAGNOSIS — IMO0001 Reserved for inherently not codable concepts without codable children: Secondary | ICD-10-CM

## 2011-06-06 DIAGNOSIS — E038 Other specified hypothyroidism: Secondary | ICD-10-CM

## 2011-06-06 DIAGNOSIS — E063 Autoimmune thyroiditis: Secondary | ICD-10-CM

## 2011-06-06 LAB — GLUCOSE, POCT (MANUAL RESULT ENTRY): POC Glucose: 209

## 2011-06-06 LAB — POCT GLYCOSYLATED HEMOGLOBIN (HGB A1C): Hemoglobin A1C: 6.6

## 2011-06-06 NOTE — Patient Instructions (Signed)
Please start Victoza at 0.6 mg/day. Please advance Victoza by 2 clicks per week as tolerated, up to 1.8 mg/day if possible. Be prepared to taper or stop the glyburide. Please continue Januvia.

## 2011-06-08 ENCOUNTER — Telehealth: Payer: Self-pay | Admitting: Internal Medicine

## 2011-06-08 NOTE — Telephone Encounter (Signed)
Spoke with pt. She states at last ov CDY ordered ONO with [facility] and she has not heard anything from [facility] yet.  Will forward to [facility].

## 2011-06-11 NOTE — Telephone Encounter (Signed)
Order given to [facility] to do ono on ra for this pt

## 2011-06-26 ENCOUNTER — Other Ambulatory Visit (HOSPITAL_COMMUNITY): Payer: Self-pay | Admitting: Oncology

## 2011-06-26 ENCOUNTER — Encounter (HOSPITAL_COMMUNITY): Payer: Medicare Other | Attending: Oncology

## 2011-06-26 DIAGNOSIS — C9000 Multiple myeloma not having achieved remission: Secondary | ICD-10-CM

## 2011-06-26 DIAGNOSIS — E119 Type 2 diabetes mellitus without complications: Secondary | ICD-10-CM | POA: Insufficient documentation

## 2011-06-26 DIAGNOSIS — D472 Monoclonal gammopathy: Secondary | ICD-10-CM | POA: Insufficient documentation

## 2011-06-26 LAB — COMPREHENSIVE METABOLIC PANEL
ALT: 14 U/L (ref 0–35)
Albumin: 3.4 g/dL — ABNORMAL LOW (ref 3.5–5.2)
Alkaline Phosphatase: 74 U/L (ref 39–117)
BUN: 35 mg/dL — ABNORMAL HIGH (ref 6–23)
Calcium: 9.6 mg/dL (ref 8.4–10.5)
GFR calc Af Amer: 51 mL/min — ABNORMAL LOW (ref >60)
Potassium: 4.3 mEq/L (ref 3.5–5.1)
Sodium: 139 mEq/L (ref 135–145)
Total Protein: 7.3 g/dL (ref 6.0–8.3)

## 2011-06-26 LAB — CBC
MCH: 29.7 pg (ref 26.0–34.0)
MCHC: 32.5 g/dL (ref 30.0–36.0)
RDW: 16.2 % — ABNORMAL HIGH (ref 11.5–15.5)

## 2011-06-26 LAB — DIFFERENTIAL
Eosinophils Absolute: 0 10*3/uL (ref 0.0–0.7)
Eosinophils Relative: 1 % (ref 0–5)
Lymphocytes Relative: 23 % (ref 12–46)
Lymphs Abs: 1.2 10*3/uL (ref 0.7–4.0)
Monocytes Absolute: 0.3 10*3/uL (ref 0.1–1.0)
Monocytes Relative: 6 % (ref 3–12)

## 2011-06-27 LAB — KAPPA/LAMBDA LIGHT CHAINS
Kappa free light chain: 5.84 mg/dL — ABNORMAL HIGH (ref 0.33–1.94)
Kappa, lambda light chain ratio: 2.01 — ABNORMAL HIGH (ref 0.26–1.65)

## 2011-06-28 ENCOUNTER — Ambulatory Visit (HOSPITAL_COMMUNITY)
Admission: RE | Admit: 2011-06-28 | Discharge: 2011-06-28 | Disposition: A | Discharge status: Home / Self Care | Payer: Medicare Other | Source: Ambulatory Visit | Attending: Oncology | Admitting: Oncology

## 2011-06-28 DIAGNOSIS — R222 Localized swelling, mass and lump, trunk: Secondary | ICD-10-CM | POA: Insufficient documentation

## 2011-06-28 LAB — MULTIPLE MYELOMA PANEL, SERUM
Albumin ELP: 50.4 % — ABNORMAL LOW (ref 55.8–66.1)
Alpha-1-Globulin: 5.1 % — ABNORMAL HIGH (ref 2.9–4.9)
Alpha-2-Globulin: 9.8 % (ref 7.1–11.8)
Gamma Globulin: 12.8 % (ref 11.1–18.8)
IgA: 865 mg/dL — ABNORMAL HIGH (ref 69–380)
IgG (Immunoglobin G), Serum: 981 mg/dL (ref 690–1700)
IgM, Serum: 62 mg/dL (ref 52–322)

## 2011-07-03 ENCOUNTER — Ambulatory Visit: Payer: Medicare Other | Admitting: Internal Medicine

## 2011-07-13 ENCOUNTER — Ambulatory Visit: Payer: Medicare Other | Admitting: Internal Medicine

## 2011-07-30 ENCOUNTER — Other Ambulatory Visit (HOSPITAL_COMMUNITY): Payer: Medicare Other

## 2011-08-06 ENCOUNTER — Encounter (HOSPITAL_COMMUNITY): Payer: Medicare Other | Attending: Oncology

## 2011-08-06 VITALS — BP 139/83 | HR 87 | Temp 98.0°F | Wt 318.0 lb

## 2011-08-06 DIAGNOSIS — D472 Monoclonal gammopathy: Secondary | ICD-10-CM

## 2011-08-06 NOTE — Patient Instructions (Signed)
[facility]  Discharge Instructions   SPECIAL INSTRUCTIONS/FOLLOW-UP: Lab work Needed and Return to Clinic in 6 months.  Go to the front desk to schedule your appointments.   I acknowledge that I have been informed and understand all the instructions given to me and received a copy. I do not have any more questions at this time, but understand that I may call the Specialty [facility] at [facility] at [phone] during business hours should I have any further questions or need assistance in obtaining follow-up care.    __________________________________________  _____________  __________ Signature of Patient or Authorized Representative            Date                   Time    __________________________________________ Nurse's Signature

## 2011-08-06 NOTE — Progress Notes (Signed)
CC:   [name], M.D. Duke Salvia Eliott Nine, M.D. [name], M.D. David Stall, M.D.  PROBLEM:  MGUS diagnosed in 2010 on regular followup.  INTERVAL HISTORY:  [name] returns in followup.  She is doing well and really has had no other intercurrent problems.  She was seen recently by the PA for evaluation of an area on her abdomen which is felt to be soft tissue, i.e., lipoma.  She has had no other complaints related to this.  Denies any headaches, blurry vision, shortness of breath or cough.  She feels otherwise well.  Her medication list has been reviewed.  Essentially, there have been no changes.  She is followed by Dr. Fannie Knee for her issues related to dyspnea, and he has ordered continuous O2 saturations at nighttime.  Of note, she also is on CPAP.  Her diabetes appears to be under reasonably good control. There are some ongoing changes related to this as well.  She has had labs performed as recently as June 2012.  PHYSICAL EXAMINATION:  General:  Pleasant woman examined in the sitting position as she cannot get up on the exam table.  Vital Signs: Blood pressure is 139/83, temperature is 98, respiratory rate is 20, pulse is 87.  Head and neck area:  No palpable adenopathy.  Lungs:  Clear. Heart:  Sounds are normal.  No axillary adenopathy.  Abdomen: Protuberant.  No obvious palpable hepatosplenomegaly.  No inguinal adenopathy.  Extremities:  No peripheral edema.  LABORATORY DATA:  From 06/26/2011:  The M spike is 0.23 g out of a total of 0.93.  Total IgG is 981, IgA is 865.  Kappa free light chain is 5.84 mg/dL.  Creatinine is 1.28, glucose 192.  CBC is within normal limits.  IMPRESSION AND PLAN:  Pleasant woman who presents with monoclonal gammopathy of undetermined significance on regular followup. Essentially when comparing these labs to her baseline, there have been no changes in about 2 years.  I have scheduled her to come back in followup to see [place]  in 6 months' time with repeat labs.  She agrees to the overall plan.    ______________________________ Pierce Crane, M.D., F.R.C.P.C. PR/MEDQ  D:  08/06/2011  T:  08/06/2011  Job:  [id number]

## 2011-08-06 NOTE — Progress Notes (Signed)
Note dictated

## 2011-08-14 ENCOUNTER — Ambulatory Visit: Payer: Medicare Other | Admitting: Internal Medicine

## 2011-08-16 ENCOUNTER — Other Ambulatory Visit: Payer: Self-pay | Admitting: Internal Medicine

## 2011-08-16 DIAGNOSIS — Z1231 Encounter for screening mammogram for malignant neoplasm of breast: Secondary | ICD-10-CM

## 2011-08-23 ENCOUNTER — Telehealth: Payer: Self-pay | Admitting: *Deleted

## 2011-08-23 DIAGNOSIS — I2789 Other specified pulmonary heart diseases: Secondary | ICD-10-CM

## 2011-08-23 DIAGNOSIS — G4733 Obstructive sleep apnea (adult) (pediatric): Secondary | ICD-10-CM

## 2011-08-23 NOTE — Telephone Encounter (Signed)
Pt states she was aware of the multiple phone calls regarding this matter but however she had been in the hospital. I advised her we will reorder ONO as its been over 30 days; pt agreed to repeat test. Order placed.

## 2011-08-30 ENCOUNTER — Ambulatory Visit: Payer: Medicare Other

## 2011-08-30 ENCOUNTER — Encounter: Payer: Self-pay | Admitting: Internal Medicine

## 2011-08-30 ENCOUNTER — Ambulatory Visit
Admission: RE | Admit: 2011-08-30 | Discharge: 2011-08-30 | Disposition: A | Discharge status: Home / Self Care | Payer: Medicare Other | Source: Ambulatory Visit | Attending: Internal Medicine | Admitting: Internal Medicine

## 2011-08-30 ENCOUNTER — Ambulatory Visit (INDEPENDENT_AMBULATORY_CARE_PROVIDER_SITE_OTHER): Payer: Medicare Other | Admitting: Internal Medicine

## 2011-08-30 VITALS — BP 138/76 | HR 80 | Temp 97.9°F

## 2011-08-30 DIAGNOSIS — R509 Fever, unspecified: Secondary | ICD-10-CM

## 2011-08-30 DIAGNOSIS — M797 Fibromyalgia: Secondary | ICD-10-CM | POA: Insufficient documentation

## 2011-08-30 DIAGNOSIS — R8290 Unspecified abnormal findings in urine: Secondary | ICD-10-CM

## 2011-08-30 DIAGNOSIS — R238 Other skin changes: Secondary | ICD-10-CM

## 2011-08-30 DIAGNOSIS — IMO0001 Reserved for inherently not codable concepts without codable children: Secondary | ICD-10-CM

## 2011-08-30 DIAGNOSIS — R82998 Other abnormal findings in urine: Secondary | ICD-10-CM

## 2011-08-30 LAB — POCT URINALYSIS DIPSTICK
Ketones, UA: NEGATIVE
Protein, UA: NEGATIVE

## 2011-08-30 MED ORDER — CEFTRIAXONE SODIUM 1 G IJ SOLR
1.0000 g | Freq: Once | INTRAMUSCULAR | Status: AC
  Administered 2011-08-30: 1 g via INTRAMUSCULAR

## 2011-08-30 NOTE — Progress Notes (Signed)
  Subjective:    Patient ID: [name], female    DOB: 08/25/1950, 61 y.o.   MRN: [id number]  HPI patient says she's had some unexplained bruising legs and left buttock. She saw Dr. Caprice Kluver, cardiologist on Monday, August 27. He did considerable lab work including CBC , complete metabolic panel, TSH all of which were normal . She has a history of fibromyalgia syndrome. Has been on chronic hydrocodone/APAP therapy for musculoskeletal pain for a number of years. Patient complains of fever up to 101. Doesn't know what's causing the fever. Denies nausea, cough, chills, or urinary tract symptoms. Seems to be having trouble ambulating because of complaint of diffuse myalgias.    Review of Systems     Objective:   Physical Exam HEENT exam: TMs and pharynx are clear; neck is supple without adenopathy; chest is clear; has considerable bruising left buttock but denies fall.; Some minor scratches and abrasions on legs but no evidence of cellulitis.        Assessment & Plan:  Severe myalgias associated with fever-etiology unclear but consider possible viral infection. In the past, she has occult urinary tract infections. Urinalysis today is slightly abnormal with trace leukocyte esterase present. Culture is pending. She looks a bit pale today but I think she is in considerable pain. We gave her an injection of IM Rocephin 1 g and started her on doxycycline 100 mg by mouth twice daily for 10 days. She will call me with her progress report tomorrow. For pain, gave her Tylox (#60) 1 by mouth every 12 hours when necessary pain with no refill.

## 2011-08-31 ENCOUNTER — Encounter: Payer: Self-pay | Admitting: Internal Medicine

## 2011-09-01 LAB — URINE CULTURE: Colony Count: NO GROWTH

## 2011-09-07 ENCOUNTER — Ambulatory Visit (INDEPENDENT_AMBULATORY_CARE_PROVIDER_SITE_OTHER): Payer: Medicare Other | Admitting: Internal Medicine

## 2011-09-07 ENCOUNTER — Encounter: Payer: Self-pay | Admitting: Internal Medicine

## 2011-09-07 VITALS — BP 130/84 | HR 84 | Ht 67.0 in | Wt 298.0 lb

## 2011-09-07 DIAGNOSIS — R0602 Shortness of breath: Secondary | ICD-10-CM

## 2011-09-07 DIAGNOSIS — E662 Morbid (severe) obesity with alveolar hypoventilation: Secondary | ICD-10-CM

## 2011-09-07 DIAGNOSIS — G4733 Obstructive sleep apnea (adult) (pediatric): Secondary | ICD-10-CM

## 2011-09-07 NOTE — Patient Instructions (Addendum)
Order- [facility]- order home O2 for sleep 2 L/M through CPAP      Dx OSA, obesity hypoventilation  Don't forget to get your flu shot when you finish the antibiotics.

## 2011-09-07 NOTE — Progress Notes (Signed)
Subjective:    Patient ID: [name], female    DOB: [birth year], 61 y.o.   MRN: [id number]  HPI    Review of Systems     Objective:   Physical Exam        Assessment & Plan:   Subjective:    Patient ID: [name], female    DOB: 15-Aug-1950, 61 y.o.   MRN: [id number]  HPI 05/29/11- 37 yo morbidly obese, followed for OSA, pulmonary hypertension, complicated by DM, thyroid disease, AFib/pacemaker Was hosp with CHF/ AFIB in December- Dr Caprice Kluver. Improved temporarily, finding  Mitral Regurgitation. Had cath- clean coronaries  and TEE, then PFT and  CT at [facility]. Pulmonary Hypertension. Amiodarone was increased and she converted to NSR. Pacemaker has been replaced.  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 She questions "cardiac asthma". Dyspnea/ chest tightness responds to rescue inhaler, but little wheeze or cough. Today humid weather makes her more short of breath. Continues Spiriva When more restless at night it corresponds to note of more fluid retention next day. We discussed restless sleep as suggesting hypoxia. Uses CPAP all night every night at 9.   09/07/11-  60 yoF morbidly obese, followed for OSA, pulmonary hypertension, complicated by DM, thyroid disease, AFib/pacemaker, hx MGUS She describes problem with bruising 2 weeks ago. She saw Dr Clarene Duke and Dr Lenord Fellers-  Given Rocephin and doxycycline empirically. She is better now, but remains tireder than usual. She did not recognize an obvious infection otherwise. ONOX 08/31/11- </= 88% for 133 minutes. Reviewed with her and agreed she meets indications to add O2 for her CPAP, on the basis of obesity hypoventilation syndrome.  Breathing is comfortable now at rest and labored with exertion. She still tries to do water aerobics.  She wants to wait on flu vax till she finishes this round of doxycycline.   Review of Systems Constitutional:   No weight loss, night sweats,  Fevers, chills, fatigue, lassitude. HEENT:   No headaches,   Difficulty swallowing,  Tooth/dental problems,  Sore throat,                No sneezing, itching, ear ache, nasal congestion, post nasal drip,  CV:  No acute chest pain, orthopnea, PND,  anasarca, dizziness, palpitations GI  No heartburn, indigestion, abdominal pain, nausea, vomiting, diarrhea, change in bowel habits, loss of appetite Resp: No excess mucus, no productive cough,  No non-productive cough,  No coughing up of blood.  No change in color of mucus.  No wheezing.   Skin: no rash or lesions. GU: no dysuria, change in color of urine, no urgency or frequency.  No flank pain. MS:  No joint pain or swelling.  No decreased range of motion.  No back pain. Psych:  No change in mood or affect. No depression or anxiety.  No memory loss.  Objective:   Physical Exam General- Alert, Oriented, Affect-appropriate, Distress- none acute  Massively obese Skin- rash-none, lesions- none, excoriation- none Lymphadenopathy- none Head- atraumatic            Eyes- Gross vision intact, PERRLA, conjunctivae clear secretions            Ears- Hearing, canals normal            Nose- Clear, no-Septal dev, mucus, polyps, erosion, perforation             Throat- Mallampati II , mucosa clear , drainage- none, tonsils- atrophic Neck- flexible , trachea midline, no stridor , thyroid nl, carotid  no bruit Chest - symmetrical excursion , unlabored           Heart/CV- RRR , no murmur , no gallop  , no rub, nl s1 s2                           - JVD- none , edema- 1-2+ chronic edema, stasis changes, varices- none           Lung- clear to P&A, but shallow consistent with her body habitus, wheeze- none, cough- none , dullness-none, rub- none           Chest wall-  Abd- tender-no, distended-no, bowel sounds-present,  Br/ Gen/ Rectal- Not done, not indicated Extrem- cyanosis- none, clubbing, none, atrophy- none, strength- nl Neuro- grossly intact to observation            Assessment & Plan:

## 2011-09-07 NOTE — Assessment & Plan Note (Addendum)
To continue CPAP 10 We will add O2 for obesity hypoventilation complicating OSA

## 2011-09-09 DIAGNOSIS — E662 Morbid (severe) obesity with alveolar hypoventilation: Secondary | ICD-10-CM | POA: Insufficient documentation

## 2011-09-09 NOTE — Assessment & Plan Note (Signed)
We're adding oxygen to  her CPAP during sleep.

## 2011-09-17 ENCOUNTER — Encounter: Payer: Self-pay | Admitting: Internal Medicine

## 2011-09-24 ENCOUNTER — Ambulatory Visit: Payer: Medicare Other | Admitting: "Endocrinology

## 2011-09-27 ENCOUNTER — Ambulatory Visit (INDEPENDENT_AMBULATORY_CARE_PROVIDER_SITE_OTHER): Payer: Medicare Other | Admitting: "Endocrinology

## 2011-09-27 VITALS — BP 139/87 | HR 81 | Wt 332.0 lb

## 2011-09-27 DIAGNOSIS — R600 Localized edema: Secondary | ICD-10-CM

## 2011-09-27 DIAGNOSIS — E559 Vitamin D deficiency, unspecified: Secondary | ICD-10-CM

## 2011-09-27 DIAGNOSIS — D509 Iron deficiency anemia, unspecified: Secondary | ICD-10-CM

## 2011-09-27 DIAGNOSIS — E211 Secondary hyperparathyroidism, not elsewhere classified: Secondary | ICD-10-CM

## 2011-09-27 DIAGNOSIS — R609 Edema, unspecified: Secondary | ICD-10-CM

## 2011-09-27 DIAGNOSIS — E038 Other specified hypothyroidism: Secondary | ICD-10-CM

## 2011-09-27 DIAGNOSIS — E063 Autoimmune thyroiditis: Secondary | ICD-10-CM

## 2011-09-27 DIAGNOSIS — IMO0001 Reserved for inherently not codable concepts without codable children: Secondary | ICD-10-CM

## 2011-09-27 LAB — BASIC METABOLIC PANEL
BUN: 33 — ABNORMAL HIGH
Calcium: 9.6
Chloride: 97
Creatinine, Ser: 1.52 — ABNORMAL HIGH
GFR calc Af Amer: 42 — ABNORMAL LOW
GFR calc non Af Amer: 35 — ABNORMAL LOW

## 2011-09-27 LAB — CBC
MCV: 90.5
Platelets: 188
RBC: 4.25
WBC: 9.6

## 2011-09-27 LAB — GLUCOSE, POCT (MANUAL RESULT ENTRY): POC Glucose: 183

## 2011-09-27 LAB — PROTIME-INR
INR: 1.1
Prothrombin Time: 14.1

## 2011-09-27 MED ORDER — SITAGLIPTIN PHOSPHATE 100 MG PO TABS: 100.0000 mg | ORAL_TABLET | Freq: Every day | ORAL | Status: DC

## 2011-09-27 MED ORDER — GLIMEPIRIDE 4 MG PO TABS: 4.0000 mg | ORAL_TABLET | Freq: Two times a day (BID) | ORAL | Status: DC

## 2011-09-27 NOTE — Patient Instructions (Addendum)
Followup visit in 3 months. Take one 4 mg glimepiride tablet twice daily. Please call Dr. Fransico Michael if you develop problems with low blood sugar.

## 2011-10-02 ENCOUNTER — Other Ambulatory Visit: Payer: Self-pay | Admitting: Internal Medicine

## 2011-10-02 NOTE — Telephone Encounter (Signed)
Written Rx for #90 with 3 refills Lorcet 10/650

## 2011-10-05 LAB — PTH, INTACT AND CALCIUM
Calcium, Total (PTH): 9.4 mg/dL (ref 8.4–10.5)
PTH: 85 pg/mL — ABNORMAL HIGH (ref 14.0–72.0)

## 2011-10-05 LAB — COMPREHENSIVE METABOLIC PANEL
AST: 15 U/L (ref 0–37)
Albumin: 4 g/dL (ref 3.5–5.2)
Alkaline Phosphatase: 57 U/L (ref 39–117)
BUN: 34 mg/dL — ABNORMAL HIGH (ref 6–23)
Potassium: 4.6 mEq/L (ref 3.5–5.3)
Sodium: 140 mEq/L (ref 135–145)
Total Bilirubin: 1.1 mg/dL (ref 0.3–1.2)

## 2011-10-05 LAB — TSH: TSH: 3.183 u[IU]/mL (ref 0.350–4.500)

## 2011-10-05 LAB — IRON: Iron: 64 ug/dL (ref 42–145)

## 2011-10-05 LAB — VITAMIN D 25 HYDROXY (VIT D DEFICIENCY, FRACTURES): Vit D, 25-Hydroxy: 58 ng/mL (ref 30–89)

## 2011-10-12 LAB — CBC
MCHC: 33.8
RBC: 3.65 — ABNORMAL LOW
RDW: 15.2 — ABNORMAL HIGH

## 2011-10-12 LAB — BASIC METABOLIC PANEL
CO2: 30
Calcium: 8.9
Creatinine, Ser: 1.42 — ABNORMAL HIGH
GFR calc Af Amer: 46 — ABNORMAL LOW
GFR calc non Af Amer: 38 — ABNORMAL LOW

## 2011-10-12 LAB — PROTIME-INR
INR: 1.2
Prothrombin Time: 15.8 — ABNORMAL HIGH

## 2011-10-12 LAB — APTT: aPTT: 33

## 2011-10-24 ENCOUNTER — Ambulatory Visit
Admission: RE | Admit: 2011-10-24 | Discharge: 2011-10-24 | Disposition: A | Discharge status: Home / Self Care | Payer: Medicare Other | Source: Ambulatory Visit | Attending: Internal Medicine | Admitting: Internal Medicine

## 2011-10-24 DIAGNOSIS — Z1231 Encounter for screening mammogram for malignant neoplasm of breast: Secondary | ICD-10-CM

## 2011-10-29 ENCOUNTER — Other Ambulatory Visit: Payer: Self-pay | Admitting: *Deleted

## 2011-10-29 MED ORDER — CALCITRIOL 0.25 MCG PO CAPS: 0.2500 ug | ORAL_CAPSULE | Freq: Two times a day (BID) | ORAL | Status: DC

## 2011-10-29 MED ORDER — GLYBURIDE 5 MG PO TABS: ORAL_TABLET | ORAL | Status: DC

## 2011-11-24 ENCOUNTER — Encounter: Payer: Self-pay | Admitting: "Endocrinology

## 2011-11-24 DIAGNOSIS — E049 Nontoxic goiter, unspecified: Secondary | ICD-10-CM | POA: Insufficient documentation

## 2011-11-24 DIAGNOSIS — Z905 Acquired absence of kidney: Secondary | ICD-10-CM | POA: Insufficient documentation

## 2011-11-24 DIAGNOSIS — E559 Vitamin D deficiency, unspecified: Secondary | ICD-10-CM | POA: Insufficient documentation

## 2011-11-24 DIAGNOSIS — N2581 Secondary hyperparathyroidism of renal origin: Secondary | ICD-10-CM | POA: Insufficient documentation

## 2011-11-24 DIAGNOSIS — R5383 Other fatigue: Secondary | ICD-10-CM | POA: Insufficient documentation

## 2011-11-24 DIAGNOSIS — E063 Autoimmune thyroiditis: Secondary | ICD-10-CM | POA: Insufficient documentation

## 2011-11-24 NOTE — Progress Notes (Signed)
Subjective:  Patient Name: [name] Date of Birth: 03-29-1950  MRN: [id number]  [name]  presents to the office today for follow-up of her type 2 diabetes mellitus, goiter, thyroiditis, hypothyroid, secondary hyperparathyroidism, vitamin D deficiency, fatigue, edema, and obesity.  HISTORY OF PRESENT ILLNESS:   [name] is a 61 y.o. Caucasian woman.[name] was unaccompanied.  1.Tthe patient was first referred to me on 10/30/05 by her primary care provider, Dr. Sharlet Salina, for evaluation and c-omanagement of her diabetes and obesity. She had been obese since early childhood. She weighed between 270-290 pounds at her graduation from high school. Her diabetes had been diagnosed in early 2005. At that time she was taking Glucophage twice daily and Glucotrol twice daily. She had a very complicated past medical history to include coronary artery spasm, obstructive sleep apnea, edema, hypothyroidism, hypertension, fibromyalgia syndrome, psoriasis, and elevated Bence-Jones protein. She had undergone a left nephrectomy, repair of ruptured umbilical hernia, repair of ruptured tubal pregnancy, and pacemaker placement x2. On physical examination her weight was 275 pounds at a height of 64 inches. Her BMI was 64. Her blood pressure was 136/92. Hemoglobin A1c was 7.4%. Her thyroid gland was at the upper limit of normal size of 20 g. She had tenderness in her right thyroid lobe which was consistent with Hashimoto's disease. She had trace DP pulses in her feet. Laboratory data included a normal BMP. TSH was 1.864, free T4 was 1.53, and free T3 was 2.5. She had a TPO antibody of 53.8, which was elevated. Her microalbumin to creatinine ratio was 26 (normal less than 30). I instructed her on our Eat Right Diet plan. We also gave her additional diabetes education in our office. I then started her on Byetta, 5 mcg injection, twice daily. 2. T2DM: During the next 4 years, although the patient could not tolerate  the 10 mcg dose of Byetta, she was able to continue the 5 mg dose twice daily. During that time her hemoglobin A1c values remained in the 5.3-6.2 range. By 2010, however, she had so much gastrointestinal distress that she gradually discontinued Byetta. At that time her hemoglobin A1c was 6.2%. I then started her on Victoza at a dose of 0.6 mg per day, which she gradually increased to 1.8 mg per day by 09/19/10. At that point she had already stopped her glipizide and metformin. Her hemoglobin A1c had increased to 7.0. She then decided that she could no longer tolerate the Victoza; so I placed her back on glipizide. By January 2012, her glipizide dose was 30 mg per day and her blood sugars were still in the 200s. Hemoglobin A1c at that point was 7.7%. I changed her glipizide to glyburide at 15 mg twice daily and then started Januvia at 100 mg per day. 3. Hypothyroid, thyroiditis, and goiter: During the past 6 years she has had flareups of Hashimoto's thyroiditis which  resulted in episodic waxing and waning of her thyroid gland size and painful swelling at times. We havel gradually increased her Synthroid dose from 25 mcg per day to 62.5 mcg per day (2-1/2 of a 25 mcg tablets).  4. Secondary hyperparathyroidism and vitamin D deficiency: At her clinic visit on 10/06/2007, the patient had been complaining about some muscle aches and cramps. I decided that on her next lab draw I would order a CMP with potassium and calcium, PTH, and vitamin D levels. Those tests were performed on 12/31/07 and showed a calcium of 8.8 (normal 8.4-10.5), parathyroid hormone of 157.4 (normal  14-72), 25-hydroxy vitamin D of 12 (normal 30-89), and 1, 25-dihydroxy vitamin D of 21 (normal 60 S. 62). These tests were very interesting. The calcium was actually in the lower quartile of the normal range with a very elevated parathyroid hormone. Primary hyperparathyroidism, the calcium would've been at the upper end of the normal range or frankly  high the this case, however, the calcium was low-normal, the 25 hydroxy vitamin D was very low, and the 1, 25 dihydroxy vitamin D was inappropriately normal. Given the high PTH, even at a low level of 25 vitamin D, the 1, 25 vitamin D should have been much higher. It appeared that 3 things were occurring. First, she did not have enough calcium intake. Second, she did not have enough vitamin D intake for the liver to hydroxylate. Third, it appeared that despite the high PTH level, her solitary kidney was not adequately performing the 1-alpha hydroxylation of 25 Vitamin F necessart to fully activate the 25 vitamin D that was present to its active metabolite 1,25 Vitamin D. I started the patient on 50,000 international units of vitamin D once a week by mouth. I also asked her to take one Centrum multivitamin once a day and Citracal plus D. twice daily. By 04/19/2008 the calcium and increased to 9.3, 25 vitamin D increased to 31, the 1, 25 vitamin D increased to 30, but the PTH had also increased to 234.9. Four months later, however on 08/03/2008, the calcium was 9.1, 25 vitamin D. was 41, 1,25 vitamin D was 25, and PTH was 78.8. These laboratory values confirm the diagnosis secondary hyperparathyroidism, and also demonstrated that the 1, 25 vitamin D was still relatively low for the level of 25 vitamin D. Therefore started the patient on calcitriol, 0.25 mcg daily. On 11/16/08 the calcium and dropped weight 0.5, the 25 vitamin D was 30, the PTH increased to 179.1, and the 1, 25 vitamin D was 38. On investigation, I learned that the patient had been using a colon cleanser. I asked her to stop using the colon cleanser and to increase her calcium and vitamin D. I also increased her calcitriol to twice daily. Since then, her calcium, vitamin D levels, and PTH have varied with her intake of calcium, vitamin D, and calcitriol. Her PTH levels, however, have remained normal, varying between 40.5-48.9 in the past year. 5. The  patient's last [facility] visit was on  02/20/11. In the interim,  The patient received a new cardiac pacemaker in late February. An episode of acute gastroenteritis in early May. Also burned her left thigh in May. At times she's had more edema which has caused dyspnea on exertion. Dr. Maple Hudson wants her to use oxygen at night. 6. Pertinent Review of Systems:  Constitutional: The patient feels  very tired. She has felt "drained" for the past week.  Eyes:  she had a recent stye of her right eyelid. Vision is good. There are no other significant eye complaints. Neck: The patient  still has episodic painful swelling of one or both thyroid lobes. Heart:  She is still bothered by paroxysmal atrial fibrillation. The patient has no complaints of chest pain or chest pressure. Gastrointestinal: Bowel movents seem normal. The patient has no complaints of excessive hunger, acid reflux, upset stomach, stomach aches or pains, diarrhea, or constipation. Legs: Muscle mass and strength seem normal. There are no complaints of numbness, tingling, burning, or pain.  She still has significant leg edema. Feet:  She still has significant foot edema to  There are no complaints of numbness, tingling, burning, or pain.  Marland Kitchen   PAST MEDICAL, FAMILY, AND SOCIAL HISTORY:  Past Medical History  Diagnosis Date  . Personal history of other diseases of circulatory system   . Cardiac pacemaker in situ   . Chronic lymphocytic thyroiditis   . Type II or unspecified type diabetes mellitus without mention of complication, not stated as uncontrolled   . Morbid obesity   . Other chronic pulmonary heart diseases   . Unspecified essential hypertension   . Obstructive sleep apnea (adult) (pediatric)   . Other and unspecified hyperlipidemia   . Fibromyalgia   . Goiter   . Thyroiditis, autoimmune   . Hypothyroidism, acquired, autoimmune   . Solitary kidney, acquired   . Fatigue   . Hyperparathyroidism, secondary   . Vitamin D deficiency  disease   . Diabetes mellitus type II   . Hyperparathyroidism     Family History  Problem Relation Age of Onset  . Cardiomyopathy Brother   . Hypertension    . Diabetes Mother   . Cancer Mother     Current outpatient prescriptions:albuterol (PROAIR HFA) 108 (90 BASE) MCG/ACT inhaler, Inhale 2 puffs into the lungs every 6 (six) hours as needed.  , Disp: , Rfl: ;  albuterol (PROVENTIL) (2.5 MG/3ML) 0.083% nebulizer solution, Take 2.5 mg by nebulization every 6 (six) hours as needed.  , Disp: , Rfl: ;  amiodarone (CORDARONE) 200 MG tablet, Take 300 mg by mouth daily.  , Disp: , Rfl:  Calcium-Magnesium-Vitamin D (CITRACAL CALCIUM+D) 600-40-500 MG-MG-UNIT TB24, Take by mouth.  , Disp: , Rfl: ;  digoxin (LANOXIN) 0.25 MG tablet, Take 250 mcg by mouth daily.  , Disp: , Rfl: ;  DULoxetine (CYMBALTA) 60 MG capsule, Take 60 mg by mouth daily.  , Disp: , Rfl: ;  ergocalciferol (VITAMIN D2) 50000 UNITS capsule, Take 50,000 Units by mouth. Twice weekly, Disp: , Rfl: ;  JANUVIA 100 MG tablet, Take 1 tablet by mouth Daily., Disp: , Rfl:  levothyroxine (SYNTHROID, LEVOTHROID) 25 MCG tablet, Take 2 1/2 daily , Disp: , Rfl: ;  metoprolol tartrate (LOPRESSOR) 25 MG tablet, Take 25 mg by mouth 2 (two) times daily.  , Disp: , Rfl: ;  Multiple Vitamin (MULTIVITAMIN) tablet, Take 1 tablet by mouth daily.  , Disp: , Rfl: ;  nitroGLYCERIN (NITRO-DUR) 0.4 mg/hr, Patch on at night and off in am , Disp: , Rfl: ;  ONE TOUCH ULTRA TEST test strip, , Disp: , Rfl:  potassium chloride SA (K-DUR,KLOR-CON) 20 MEQ tablet, Take 20 mEq by mouth 2 (two) times daily. , Disp: , Rfl: ;  tiotropium (SPIRIVA) 18 MCG inhalation capsule, Place 18 mcg into inhaler and inhale daily.  , Disp: , Rfl: ;  torsemide (DEMADEX) 100 MG tablet, Take 1/2 by mouth daily , Disp: , Rfl: ;  traMADol (ULTRAM) 50 MG tablet, Take 50 mg by mouth 2 (two) times daily as needed.  , Disp: , Rfl:  warfarin (COUMADIN) 5 MG tablet, Take 5 mg by mouth daily. As directed ,  Disp: , Rfl: ;  zolpidem (AMBIEN) 10 MG tablet, Take 10 mg by mouth at bedtime as needed.  , Disp: , Rfl: ;  calcitRIOL (ROCALTROL) 0.25 MCG capsule, Take 1 capsule (0.25 mcg total) by mouth 2 (two) times daily., Disp: 60 capsule, Rfl: 6;  doxycycline (VIBRA-TABS) 100 MG tablet, Take 1 tablet by mouth Twice daily., Disp: , Rfl:  glimepiride (AMARYL) 4 MG tablet, Take 1 tablet (4  mg total) by mouth 2 (two) times daily., Disp: 60 tablet, Rfl: 11;  glyBURIDE (DIABETA) 5 MG tablet, 2 tablets twice a day., Disp: 120 tablet, Rfl: 6;  LORCET 10/650 10-650 MG per tablet, TAKE (1) TABLET EVERY SIX HOURS AS NEEDED FOR PAIN., Disp: 90 each, Rfl: 3;  metolazone (ZAROXOLYN) 5 MG tablet, as needed., Disp: , Rfl:  sitaGLIPtin (JANUVIA) 100 MG tablet, Take 1 tablet (100 mg total) by mouth daily., Disp: 30 tablet, Rfl: 6  Allergies as of 06/06/2011 - Review Complete 06/06/2011  Allergen Reaction Noted  . Clindamycin  05/17/2008  . Diphenhydramine hcl Swelling   . Fish allergy  05/29/2011  . Iohexol  07/19/2005  . Penicillins Other (See Comments)   . Povidone-iodine Other (See Comments)   . Promethazine hcl Other (See Comments)   . Morphine sulfate Nausea And Vomiting and Rash     1. Work and Family: There are no new pertinent issues. 2. Activities:  The patient is very inactive.  3. Smoking, alcohol, or drugs: None  4. Primary Care Provider: Margaree Mackintosh, MD, MD  ROS: There are no other significant problems involving [name]'s other body systems.   Objective:  Vital Signs:  BP 131/84  Pulse 72  Wt 344 lb (156.037 kg)   Ht Readings from Last 3 Encounters:  09/07/11 5\' 7"  (1.702 m)  05/29/11 5\' 6"  (1.676 m)  10/13/10 5\' 6"  (1.676 m)   Wt Readings from Last 3 Encounters:  09/27/11 332 lb (150.594 kg)  09/07/11 298 lb (135.172 kg)  08/06/11 318 lb (144.244 kg)    PHYSICAL EXAM:  Constitutional: The patient appears  morbidly obese, tired, and worn out. She has gained 11 pounds since her last  clinic visit. Face: The face appears normal.  Eyes: She has a style in the right lower eyelid. She will see Dr. Nile Riggs tomorrow. There is no obvious arcus or proptosis. Moisture appears normal. Mouth: The oropharynx and tongue appear normal.Oral moisture is normal. Neck: The neck appears to be visibly normal. No carotid bruits are noted. The thyroid gland is  20+ grams in size.  the left lobe is within normal limits. The right lobe is slightly enlarged. Both lobes are tender. The consistency of the thyroid gland is  fairly normal.  Lungs: The lungs are clear to auscultation. Air movement is good. Heart:  She is in atrial fibrillation.I did not appreciate any pathologic cardiac murmurs. Abdomen: The abdomen  Is very much enlarged. Bowel sounds are normal. There is no obvious hepatomegaly, splenomegaly, or other mass effect.  Arms: Muscle size and bulk are normal for age. Hands: There is no obvious tremor. Phalangeal and metacarpophalangeal joints are normal. Palmar muscles are normal. Palmar skin is normal. Palmar moisture is also normal. Legs: Muscles appear normal for age.  Ler legs are very enlarged. She has 3+ pedal edema.  Feet: she had one plus DP pulses. Neurologic: Strength is normal for age in both the upper and lower extremities. Muscle tone is normal. Sensation to touch is normal in both the legs and feet.    LAB DATA: Hemoglobin A1c today was 6.6%.    Assessment and Plan:   ASSESSMENT:  1.  Type 2 diabetes mellitus: The patient's blood sugar control is much better at this visit. 2.  Thyroiditis: Her thyroiditis is active today.  3.  Goiter: Thyroid gland is somewhat smaller today.  4.  Edema: Her edema is much worse today. 5.  Obesity: Her weight is much greater, but it  is difficult to tell how much of this is tissue and how much of fluid.   6. Fatigue: Her fatigue is worse.  PLAN:  1. Diagnostic:  TFTs  2. Therapeutic:  The patient would like to try to resume Victoza. I've  asked her to start at 0.6 and advance up to 2 clicks per month as tolerated.  3. Patient education:Patient understands that she may not be able to tolerate Victoza. We'll see.  4. Follow-up: Return in about 3 months (around 09/06/2011).  Level of Service: This visit lasted in excess of 40 minutes. More than 50% of the visit was devoted to counseling.   David Stall, MD 11/24/2011 3:53 PM

## 2011-11-24 NOTE — Progress Notes (Signed)
Addended by: David Stall on: 11/24/2011 06:35 PM   Modules accepted: Orders

## 2011-11-24 NOTE — Progress Notes (Addendum)
Subjective:  Patient Name: [name] Date of Birth: Jan 22, 1950  MRN: [id number]  Gary Bultman  presents to the office today for follow-up of her type 2 diabetes mellitus, goiter, thyroiditis, hypothyroid, secondary hyperparathyroidism, vitamin D deficiency, fatigue, edema, and obesity.  HISTORY OF PRESENT ILLNESS:   Jaala is a 61 y.o. Caucasian woman.Takima was unaccompanied.  1.The patient was first referred to me on 10/30/05 by her primary care provider, Dr. Sharlet Salina, for evaluation and c-omanagement of her diabetes and obesity. I have followed her for the above problems since then. Please see my clinic note from 06/06/11 for detailed description of the course of her type 2 diabetes mellitus, hypothyroidism secondary to Hashimoto's thyroiditis, and hyperparathyroidism secondary to deficiency of calcium intake, vitamin D intake, and renal conversion of 25-hydroxy vitamin D to 1, 25-dihydroxy vitamin D.  2. The patient's last [facility] visit was on  06/06/11. In the interim, she became quite sick. She had large bruises and pain in her legs. He is accompanied by fever. She was evaluated by Dr. Clarene Duke and Dr. Lenord Fellers. She was treated with Rocephin and doxycycline. Legs are still painful. She tried the toes and again, but had such severe nausea and vomiting abdominal pain that she stopped it. She is still taking Januvia. She saw Dr. Levonne Spiller young again, who noted her oxygen saturation at night, while on CPAP, was 88. He now has has her on a combination of 2 L of oxygen and CPAP at night. 3. Pertinent Review of Systems:  Constitutional: The patient feels a little better than she did several weeks ago.   Eyes:  Side of her right eyelid has resolved. Vision is good with her glasses. There are no other significant eye complaints. Neck: The patient still has episodic painful swelling of one or both thyroid lobes. Heart:  She is still bothered by paroxysmal atrial fibrillation. The patient has no  complaints of chest pain or chest pressure. Gastrointestinal: Since stopping the Victoza, she has felt much better. Bowel movents seem normal. The patient has no complaints of excessive hunger, acid reflux, upset stomach, stomach aches or pains, diarrhea, or constipation. Legs: Muscle mass and strength seem normal. There are no complaints of numbness, tingling, burning, or pain.  Her legs were definitely better, but she still has significant leg edema. Feet:  She still has significant foot edema to There are no complaints of numbness, tingling, burning, or pain.  Marland Kitchen   PAST MEDICAL, FAMILY, AND SOCIAL HISTORY:  Past Medical History  Diagnosis Date  . Personal history of other diseases of circulatory system   . Cardiac pacemaker in situ   . Chronic lymphocytic thyroiditis   . Type II or unspecified type diabetes mellitus without mention of complication, not stated as uncontrolled   . Morbid obesity   . Other chronic pulmonary heart diseases   . Unspecified essential hypertension   . Obstructive sleep apnea (adult) (pediatric)   . Other and unspecified hyperlipidemia   . Fibromyalgia   . Goiter   . Thyroiditis, autoimmune   . Hypothyroidism, acquired, autoimmune   . Solitary kidney, acquired   . Fatigue   . Hyperparathyroidism, secondary   . Vitamin D deficiency disease   . Diabetes mellitus type II   . Hyperparathyroidism     Family History  Problem Relation Age of Onset  . Cardiomyopathy Brother   . Hypertension    . Diabetes Mother   . Cancer Mother     Current outpatient prescriptions:albuterol (PROAIR HFA) 108 (  90 BASE) MCG/ACT inhaler, Inhale 2 puffs into the lungs every 6 (six) hours as needed.  , Disp: , Rfl: ;  albuterol (PROVENTIL) (2.5 MG/3ML) 0.083% nebulizer solution, Take 2.5 mg by nebulization every 6 (six) hours as needed.  , Disp: , Rfl: ;  amiodarone (CORDARONE) 200 MG tablet, Take 300 mg by mouth daily.  , Disp: , Rfl:  calcitRIOL (ROCALTROL) 0.25 MCG capsule,  Take 1 capsule (0.25 mcg total) by mouth 2 (two) times daily., Disp: 60 capsule, Rfl: 6;  Calcium-Magnesium-Vitamin D (CITRACAL CALCIUM+D) 600-40-500 MG-MG-UNIT TB24, Take by mouth.  , Disp: , Rfl: ;  digoxin (LANOXIN) 0.25 MG tablet, Take 250 mcg by mouth daily.  , Disp: , Rfl: ;  DULoxetine (CYMBALTA) 60 MG capsule, Take 60 mg by mouth daily.  , Disp: , Rfl:  ergocalciferol (VITAMIN D2) 50000 UNITS capsule, Take 50,000 Units by mouth. Twice weekly, Disp: , Rfl: ;  JANUVIA 100 MG tablet, Take 1 tablet by mouth Daily., Disp: , Rfl: ;  levothyroxine (SYNTHROID, LEVOTHROID) 25 MCG tablet, Take 2 1/2 daily , Disp: , Rfl: ;  metolazone (ZAROXOLYN) 5 MG tablet, as needed., Disp: , Rfl: ;  metoprolol tartrate (LOPRESSOR) 25 MG tablet, Take 25 mg by mouth 2 (two) times daily.  , Disp: , Rfl:  Multiple Vitamin (MULTIVITAMIN) tablet, Take 1 tablet by mouth daily.  , Disp: , Rfl: ;  nitroGLYCERIN (NITRO-DUR) 0.4 mg/hr, Patch on at night and off in am , Disp: , Rfl: ;  ONE TOUCH ULTRA TEST test strip, , Disp: , Rfl: ;  potassium chloride SA (K-DUR,KLOR-CON) 20 MEQ tablet, Take 20 mEq by mouth 2 (two) times daily. , Disp: , Rfl: ;  tiotropium (SPIRIVA) 18 MCG inhalation capsule, Place 18 mcg into inhaler and inhale daily.  , Disp: , Rfl:  torsemide (DEMADEX) 100 MG tablet, Take 1/2 by mouth daily , Disp: , Rfl: ;  traMADol (ULTRAM) 50 MG tablet, Take 50 mg by mouth 2 (two) times daily as needed.  , Disp: , Rfl: ;  warfarin (COUMADIN) 5 MG tablet, Take 5 mg by mouth daily. As directed , Disp: , Rfl: ;  zolpidem (AMBIEN) 10 MG tablet, Take 10 mg by mouth at bedtime as needed.  , Disp: , Rfl: ;  doxycycline (VIBRA-TABS) 100 MG tablet, Take 1 tablet by mouth Twice daily., Disp: , Rfl:  glimepiride (AMARYL) 4 MG tablet, Take 1 tablet (4 mg total) by mouth 2 (two) times daily., Disp: 60 tablet, Rfl: 11;  glyBURIDE (DIABETA) 5 MG tablet, 2 tablets twice a day., Disp: 120 tablet, Rfl: 6;  LORCET 10/650 10-650 MG per tablet, TAKE  (1) TABLET EVERY SIX HOURS AS NEEDED FOR PAIN., Disp: 90 each, Rfl: 3;  sitaGLIPtin (JANUVIA) 100 MG tablet, Take 1 tablet (100 mg total) by mouth daily., Disp: 30 tablet, Rfl: 6  Allergies as of 09/27/2011 - Review Complete 09/27/2011  Allergen Reaction Noted  . Clindamycin  05/17/2008  . Diphenhydramine hcl Swelling   . Fish allergy  05/29/2011  . Iohexol  07/19/2005  . Penicillins Other (See Comments)   . Povidone-iodine Other (See Comments)   . Promethazine hcl Other (See Comments)   . Tape  08/06/2011  . Morphine sulfate Nausea And Vomiting and Rash     1. Work and Family: There are no new pertinent issues. 2. Activities:  The patient is very inactive.  3. Smoking, alcohol, or drugs: None  4. Primary Care Provider: Margaree Mackintosh, MD, MD  ROS: There  are no other significant problems involving Azari's other body systems.   Objective:  Vital Signs:  BP 139/87  Pulse 81  Wt 332 lb (150.594 kg)   Ht Readings from Last 3 Encounters:  09/07/11 5\' 7"  (1.702 m)  05/29/11 5\' 6"  (1.676 m)  10/13/10 5\' 6"  (1.676 m)   Wt Readings from Last 3 Encounters:  09/27/11 332 lb (150.594 kg)  09/07/11 298 lb (135.172 kg)  08/06/11 318 lb (144.244 kg)    PHYSICAL EXAM:  Constitutional: The patient appears  morbidly obese, tired, and worn out. She has lost 12 pounds since her last clinic visit. Her weight is back to where it was in February. Face: The face appears normal.  Eyes: There is no obvious arcus or proptosis. Moisture appears normal. Mouth: The oropharynx and tongue appear normal.Oral moisture is normal. Neck: The neck appears to be visibly normal. No carotid bruits are noted. The thyroid gland is  20+ grams in size.  The lobes are both mildly enlarged. Both lobes are tender to palpation. The consistency of the thyroid gland is  fairly normal.  Lungs: The lungs are clear to auscultation. Air movement is good. Heart:  She is in atrial fibrillation.I did not appreciate any  pathologic cardiac murmurs. Abdomen: The abdomen  Is very much enlarged. Bowel sounds are normal. There is no obvious hepatomegaly, splenomegaly, or other mass effect.  Arms: Muscle size and bulk are normal for age. Hands: There is no obvious tremor. Phalangeal and metacarpophalangeal joints are normal. Palmar muscles are normal. Palmar skin is normal. Palmar moisture is also normal. Legs: Muscles appear normal for age.  Her legs are much less enlarged. She has 2+ pedal edema.  Feet: she had one plus DP pulses. Neurologic: Strength is normal for age in both the upper and lower extremities. Muscle tone is normal. Sensation to touch is normal in both the legs and feet. She has 1+ edema of her feet.   LAB DATA: Hemoglobin A1c today was 7.1%.           06/06/11: TSH was 1.985. Free T4 was 1.63. Free T3 was 2.5.   Assessment and Plan:   ASSESSMENT:  1.  Type 2 diabetes mellitus: The patient's blood sugar control is not as good. I suspect that she missed some doses of glyburide and possibly Januvia when she was sick. When the nurse interviewed her, the patient did not list glyburide or calcitriol as medications she was currently taking. 2.  Thyroiditis: Her thyroiditis is active today.  3.  Goiter: Thyroid gland is somewhat smaller today.  4.  Edema: Her edema is much worse today. 5.  Obesity: Her weight is much greater, but it is difficult to tell how much of this is tissue and how much of fluid.   6. Fatigue: Her fatigue is worse. 7. Secondary hyperparathyroidism, deficiency of calcium and vitamin intake, and deficiency of 1, 25 vitamin D production: When our nurse questioned the patient about her medications, the patient did not list calcitriol is on the medication she was taking. When the nurse questioned her further, it appeared that the patient had run out of calcitriol, so the nurse put in an electronic order for calcitriol refills that day.  PLAN:  1. Diagnostic:  TFTs, calcium, PTH, 25  vitamin D, and 1, 25 vitamin D  2. Therapeutic:  Adjust doses of medications as needed 3. Patient education: I've asked the patient to keep a medication list of what she is taking and to  bring it to her various appointments. 4. Follow-up: Return in about 3 months (around 12/27/2011).  Level of Service: This visit lasted in excess of 40 minutes. More than 50% of the visit was devoted to counseling.   David Stall, MD 11/24/2011 5:54 PM   Addendum: Lab results from 09/27/11 are as follows:   1. TSH was 3.183, free T4 1.72, free T3 2.6. In this case, when the current TFTs are compared to the prior TFTs in June, all 3 of the thyroid function tests increased in the same direction together. Although the TSH is higher, which would normally mean the patient was hypothyroid, the free T4 and free T3 are also elevated. This phenomenon, when all 3 thyroid tests shift in the same direction upward or downward, is pathognomonic of a recent flareup of Hashimoto's thyroiditis.  2. Calcium was 9.4, PTH was 85, and 25 vitamin D was 58. There is no result shown for 1, 25 vitamin D. As I wrote in my note, I suspect that the patient missed several calcitriol doses. I have since spoken with her and asked her to make sure that she is taking her calcitriol, calcium, and vitamin D.  3. Will repeat all of these labs in November.

## 2011-11-26 LAB — T4, FREE: Free T4: 1.58 ng/dL (ref 0.80–1.80)

## 2011-11-26 LAB — T3, FREE: T3, Free: 2.5 pg/mL (ref 2.3–4.2)

## 2011-11-27 LAB — PTH, INTACT AND CALCIUM: Calcium, Total (PTH): 9.2 mg/dL (ref 8.4–10.5)

## 2011-11-28 LAB — VITAMIN D 1,25 DIHYDROXY: Vitamin D2 1, 25 (OH)2: 25 pg/mL

## 2011-12-20 ENCOUNTER — Other Ambulatory Visit: Payer: Self-pay

## 2011-12-20 MED ORDER — HYDROCODONE-ACETAMINOPHEN 10-650 MG PO TABS: 1.0000 | ORAL_TABLET | Freq: Three times a day (TID) | ORAL | Status: DC | PRN

## 2011-12-31 ENCOUNTER — Telehealth: Payer: Self-pay | Admitting: *Deleted

## 2011-12-31 NOTE — Telephone Encounter (Signed)
See note below

## 2012-01-10 ENCOUNTER — Ambulatory Visit: Payer: Medicare Other | Admitting: "Endocrinology

## 2012-01-11 ENCOUNTER — Ambulatory Visit: Payer: Medicare Other | Admitting: Internal Medicine

## 2012-01-30 ENCOUNTER — Encounter (HOSPITAL_COMMUNITY): Payer: Medicare Other | Attending: Oncology

## 2012-01-30 DIAGNOSIS — D472 Monoclonal gammopathy: Secondary | ICD-10-CM

## 2012-01-30 LAB — CBC
Hemoglobin: 12.9 g/dL (ref 12.0–15.0)
RBC: 4.39 MIL/uL (ref 3.87–5.11)

## 2012-01-30 LAB — COMPREHENSIVE METABOLIC PANEL
ALT: 16 U/L (ref 0–35)
Alkaline Phosphatase: 67 U/L (ref 39–117)
CO2: 30 mEq/L (ref 19–32)
GFR calc Af Amer: 56 mL/min — ABNORMAL LOW (ref >90)
GFR calc non Af Amer: 48 mL/min — ABNORMAL LOW (ref >90)
Glucose, Bld: 180 mg/dL — ABNORMAL HIGH (ref 70–99)
Potassium: 4.5 mEq/L (ref 3.5–5.1)
Sodium: 136 mEq/L (ref 135–145)

## 2012-01-30 LAB — DIFFERENTIAL
Basophils Absolute: 0 10*3/uL (ref 0.0–0.1)
Eosinophils Absolute: 0 10*3/uL (ref 0.0–0.7)
Eosinophils Relative: 1 % (ref 0–5)

## 2012-01-30 NOTE — Progress Notes (Signed)
Labs drawn today for B2Mic, cbc/diff, cmp,kllc,mm panel

## 2012-01-31 LAB — BETA 2 MICROGLOBULIN, SERUM: Beta-2 Microglobulin: 6.04 mg/L — ABNORMAL HIGH (ref 1.01–1.73)

## 2012-01-31 LAB — KAPPA/LAMBDA LIGHT CHAINS: Kappa free light chain: 5.64 mg/dL — ABNORMAL HIGH (ref 0.33–1.94)

## 2012-02-01 LAB — MULTIPLE MYELOMA PANEL, SERUM
Albumin ELP: 49 % — ABNORMAL LOW (ref 55.8–66.1)
Beta 2: 13.5 % — ABNORMAL HIGH (ref 3.2–6.5)
Beta Globulin: 8.1 % — ABNORMAL HIGH (ref 4.7–7.2)
IgM, Serum: 72 mg/dL (ref 52–322)
M-Spike, %: 0.53 g/dL

## 2012-02-06 ENCOUNTER — Encounter (HOSPITAL_COMMUNITY): Payer: Medicare Other | Attending: Oncology | Admitting: Oncology

## 2012-02-06 ENCOUNTER — Ambulatory Visit (HOSPITAL_COMMUNITY): Payer: Medicare Other | Admitting: Oncology

## 2012-02-06 VITALS — BP 102/68 | HR 86 | Temp 98.3°F | Ht 67.0 in | Wt 312.0 lb

## 2012-02-06 DIAGNOSIS — I428 Other cardiomyopathies: Secondary | ICD-10-CM

## 2012-02-06 DIAGNOSIS — N289 Disorder of kidney and ureter, unspecified: Secondary | ICD-10-CM

## 2012-02-06 DIAGNOSIS — D472 Monoclonal gammopathy: Secondary | ICD-10-CM

## 2012-02-06 DIAGNOSIS — I509 Heart failure, unspecified: Secondary | ICD-10-CM

## 2012-02-06 NOTE — Progress Notes (Signed)
CC:   [name]. Lenord Fellers, M.D. [name], M.D. Duke Salvia Eliott Nine, M.D. David Stall, M.D.  DIAGNOSES: 1. 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 2. Congestive heart failure with cardiomyopathy. 3. Hashimoto's thyroiditis for which she sees Dr. David Stall. 4. Pacemaker insertion and replacement. 5. Massive obesity. 6. Diabetes mellitus. 7. History of pulmonary hypertension. 8. Obstructive sleep apnea. 9. Fibromyalgia. 10.Hyperlipidemia. 11.Left kidney resection for benign reasons. 12.Renal insufficiency. 13.History of sick sinus syndrome for which a [employer] pacemaker was     placed. [name] is here today.  She actually saw my partner, Dr. Pierce Crane in August.  At that time her total IgG level was 981.  It is now 1200 mg. Her IgA level was 865 mg.  It is now down to 826 mg/dL.  IgM is almost identical.  It is still in the normal range at 72.  Her beta 2 microglobulin level was 6.04 the other day.  Kappa free light chains were 5.64, but she also had lambda free light chains that were 4.72, so both have actually increased over the last year, but are not very different compared to 2 years ago as far as the kappa light chains are concerned.  The lambda is actually what is a little bit higher than it was.  Her CBC the other day showed a normal hemoglobin, normal white count, normal platelet count still.  Her creatinine is 1.18, actually slightly better than in September when it was 1.38.  Sugar was still high at 180, albumin 3.4, total protein only 7.7.  Differential was unremarkable.  So, she still has 2 M spikes.  I would like to repeat her bone survey which has not been done in a year.  I would like to have done an MRI, but her pacemaker prevents that because it may be a little more sensitive, but I am concerned about her IgG rising somewhat and of course she had true Bence-Jones  proteinuria when she first presented here, so I would like to repeat her quantitative urine protein and immunoelectrophoresis, do a bone survey and keep an eye on her still in 6 months.  If things change with these results I will call her.  I will see her in August otherwise.  She will follow up of course with Dr. Clarene Duke and Dr. Marlan Palau.   ______________________________ Ladona Horns. Mariel Sleet, MD ESN/MEDQ  D:  02/06/2012  T:  02/06/2012  Job:  [id number]

## 2012-02-06 NOTE — Progress Notes (Signed)
This office note has been dictated.

## 2012-02-12 ENCOUNTER — Ambulatory Visit (HOSPITAL_COMMUNITY)
Admission: RE | Admit: 2012-02-12 | Discharge: 2012-02-12 | Disposition: A | Discharge status: Home / Self Care | Payer: Medicare Other | Source: Ambulatory Visit | Attending: Oncology | Admitting: Oncology

## 2012-02-12 ENCOUNTER — Encounter (HOSPITAL_BASED_OUTPATIENT_CLINIC_OR_DEPARTMENT_OTHER): Payer: Medicare Other

## 2012-02-12 DIAGNOSIS — D472 Monoclonal gammopathy: Secondary | ICD-10-CM

## 2012-02-12 LAB — PROTEIN, URINE, 24 HOUR
Collection Interval-UPROT: 24 hours
Protein, 24H Urine: 342 mg/d — ABNORMAL HIGH (ref 50–100)
Urine Total Volume-UPROT: 1800 mL

## 2012-02-14 LAB — UIFE/LIGHT CHAINS/TP QN, 24-HR UR
Beta, Urine: DETECTED — AB
Free Lambda Excretion/Day: 14.22 mg/d
Free Lambda Lt Chains,Ur: 0.79 mg/dL — ABNORMAL HIGH (ref 0.02–0.67)
Free Lt Chn Excr Rate: 1278 mg/d
Gamma Globulin, Urine: DETECTED — AB
Time: 24 hours
Total Protein, Urine-Ur/day: 1303 mg/d — ABNORMAL HIGH (ref 10–140)
Volume, Urine: 1800 mL

## 2012-03-06 ENCOUNTER — Ambulatory Visit: Payer: Medicare Other | Admitting: Internal Medicine

## 2012-03-24 ENCOUNTER — Encounter: Payer: Self-pay | Admitting: "Endocrinology

## 2012-03-24 ENCOUNTER — Ambulatory Visit (INDEPENDENT_AMBULATORY_CARE_PROVIDER_SITE_OTHER): Payer: Medicare Other | Admitting: "Endocrinology

## 2012-03-24 VITALS — BP 139/91 | HR 87 | Wt 348.2 lb

## 2012-03-24 DIAGNOSIS — I87031 Postthrombotic syndrome with ulcer and inflammation of right lower extremity: Secondary | ICD-10-CM

## 2012-03-24 DIAGNOSIS — E049 Nontoxic goiter, unspecified: Secondary | ICD-10-CM

## 2012-03-24 DIAGNOSIS — E669 Obesity, unspecified: Secondary | ICD-10-CM

## 2012-03-24 DIAGNOSIS — F5089 Other specified eating disorder: Secondary | ICD-10-CM

## 2012-03-24 DIAGNOSIS — N2581 Secondary hyperparathyroidism of renal origin: Secondary | ICD-10-CM

## 2012-03-24 DIAGNOSIS — E063 Autoimmune thyroiditis: Secondary | ICD-10-CM

## 2012-03-24 DIAGNOSIS — L97929 Non-pressure chronic ulcer of unspecified part of left lower leg with unspecified severity: Secondary | ICD-10-CM

## 2012-03-24 DIAGNOSIS — R609 Edema, unspecified: Secondary | ICD-10-CM

## 2012-03-24 DIAGNOSIS — I87039 Postthrombotic syndrome with ulcer and inflammation of unspecified lower extremity: Secondary | ICD-10-CM

## 2012-03-24 DIAGNOSIS — R5383 Other fatigue: Secondary | ICD-10-CM

## 2012-03-24 DIAGNOSIS — R5381 Other malaise: Secondary | ICD-10-CM

## 2012-03-24 DIAGNOSIS — L97909 Non-pressure chronic ulcer of unspecified part of unspecified lower leg with unspecified severity: Secondary | ICD-10-CM

## 2012-03-24 LAB — COMPREHENSIVE METABOLIC PANEL
Albumin: 3.7 g/dL (ref 3.5–5.2)
BUN: 31 mg/dL — ABNORMAL HIGH (ref 6–23)
Calcium: 9.5 mg/dL (ref 8.4–10.5)
Chloride: 96 mEq/L (ref 96–112)
Creat: 1.34 mg/dL — ABNORMAL HIGH (ref 0.50–1.10)
Glucose, Bld: 261 mg/dL — ABNORMAL HIGH (ref 70–99)
Potassium: 4.2 mEq/L (ref 3.5–5.3)

## 2012-03-24 LAB — TSH: TSH: 2.543 u[IU]/mL (ref 0.350–4.500)

## 2012-03-24 LAB — T3, FREE: T3, Free: 2.6 pg/mL (ref 2.3–4.2)

## 2012-03-24 LAB — T4, FREE: Free T4: 1.72 ng/dL (ref 0.80–1.80)

## 2012-03-24 NOTE — Patient Instructions (Signed)
Follow-up visits in one week and 3 months. Call me on Wednesday night with BG results.

## 2012-03-24 NOTE — Progress Notes (Signed)
Subjective:  Patient Name: [name] Date of Birth: Apr 06, 1950  MRN: [id number]  [name]  presents to the office today for follow-up of her type 2 diabetes mellitus, goiter, thyroiditis, hypothyroid, secondary hyperparathyroidism, vitamin D deficiency, fatigue, edema, and obesity.  HISTORY OF PRESENT ILLNESS:   Caitrin is a 62 y.o. Caucasian woman.[name] was unaccompanied.  1.The patient was first referred to me on 10/30/05 by her primary care provider, Dr. Sharlet Salina, for evaluation and co-management of her diabetes and obesity. I have followed her for the above problems since then. Please see my clinic note from 06/06/11 for detailed description of the course of her type 2 diabetes mellitus, hypothyroidism secondary to Hashimoto's thyroiditis, and hyperparathyroidism secondary to deficiency of calcium intake, vitamin D intake, and renal conversion of 25-hydroxy vitamin D to 1, 25-dihydroxy vitamin D.  2. The patient's last [facility] visit was on  09/27/11. In the interim, she became quite sick. She had large bruises and pain in her legs. The bruises were accompanied by fever. She was evaluated by Dr. Clarene Duke and Dr. Lenord Fellers. She was treated with Rocephin and doxycycline. Legs are still painful. She tried Victoza again, but had such severe nausea,  Vomiting, and abdominal pain that she stopped it. She is still taking Januvia. She saw Dr. Fannie Knee again, who noted her oxygen saturation at night, while on CPAP, was 88. He now has her on a combination of 2 L of oxygen and CPAP at night. 3. Pertinent Review of Systems:  Constitutional: The patient feels a little better than she did several weeks ago.   Eyes:  Vision is good with her glasses. There are no other significant eye complaints. Neck: The patient still has episodic painful swelling of one or both thyroid lobes. Heart:  She is still bothered by paroxysmal atrial fibrillation. The patient has no complaints of chest pain or chest  pressure. Gastrointestinal: Since stopping the Victoza, she has felt much better. Bowel movents seem normal. The patient has no complaints of excessive hunger, acid reflux, upset stomach, stomach aches or pains, diarrhea, or constipation. Legs: Muscle mass and strength seem normal for her. There are no complaints of numbness, tingling, burning, or pain.  She still has significant leg edema. Feet:  She still has significant foot edema. There are no complaints of numbness, tingling, burning, or pain.  Marland Kitchen   PAST MEDICAL, FAMILY, AND SOCIAL HISTORY:  Past Medical History  Diagnosis Date  . Personal history of other diseases of circulatory system   . Cardiac pacemaker in situ   . Chronic lymphocytic thyroiditis   . Type II or unspecified type diabetes mellitus without mention of complication, not stated as uncontrolled   . Morbid obesity   . Other chronic pulmonary heart diseases   . Unspecified essential hypertension   . Obstructive sleep apnea (adult) (pediatric)   . Other and unspecified hyperlipidemia   . Fibromyalgia   . Goiter   . Thyroiditis, autoimmune   . Hypothyroidism, acquired, autoimmune   . Solitary kidney, acquired   . Fatigue   . Hyperparathyroidism, secondary   . Vitamin D deficiency disease   . Diabetes mellitus type II   . Hyperparathyroidism     Family History  Problem Relation Age of Onset  . Cardiomyopathy Brother   . Hypertension    . Diabetes Mother   . Cancer Mother     Current outpatient prescriptions:albuterol (PROAIR HFA) 108 (90 BASE) MCG/ACT inhaler, Inhale 2 puffs into the lungs every 6 (six)  hours as needed.  , Disp: , Rfl: ;  albuterol (PROVENTIL) (2.5 MG/3ML) 0.083% nebulizer solution, Take 2.5 mg by nebulization every 6 (six) hours as needed.  , Disp: , Rfl: ;  amiodarone (CORDARONE) 200 MG tablet, Take 300 mg by mouth daily.  , Disp: , Rfl:  calcitRIOL (ROCALTROL) 0.25 MCG capsule, Take 1 capsule (0.25 mcg total) by mouth 2 (two) times daily.,  Disp: 60 capsule, Rfl: 6;  Calcium-Magnesium-Vitamin D (CITRACAL CALCIUM+D) 600-40-500 MG-MG-UNIT TB24, Take 1 tablet by mouth 2 (two) times daily. , Disp: , Rfl: ;  digoxin (LANOXIN) 0.25 MG tablet, Take 250 mcg by mouth daily.  , Disp: , Rfl: ;  DULoxetine (CYMBALTA) 60 MG capsule, Take 30 mg by mouth 2 (two) times daily. , Disp: , Rfl:  ergocalciferol (VITAMIN D2) 50000 UNITS capsule, Take 50,000 Units by mouth. Twice weekly, Disp: , Rfl: ;  glimepiride (AMARYL) 4 MG tablet, Take 1 tablet (4 mg total) by mouth 2 (two) times daily., Disp: 60 tablet, Rfl: 11;  glyBURIDE (DIABETA) 5 MG tablet, 2 tablets twice a day., Disp: 120 tablet, Rfl: 6 HYDROcodone-acetaminophen (LORCET 10/650) 10-650 MG per tablet, Take 1 tablet by mouth every 8 (eight) hours as needed for pain., Disp: 90 tablet, Rfl: 5;  JANUVIA 100 MG tablet, Take 1 tablet by mouth Daily., Disp: , Rfl: ;  levothyroxine (SYNTHROID, LEVOTHROID) 25 MCG tablet, Take  162.5 mcg on odd days and 75 mcg on even days, Disp: , Rfl: ;  metolazone (ZAROXOLYN) 5 MG tablet, as needed., Disp: , Rfl:  metoprolol tartrate (LOPRESSOR) 25 MG tablet, Take 25 mg by mouth 2 (two) times daily.  , Disp: , Rfl: ;  Multiple Vitamin (MULTIVITAMIN) tablet, Take 1 tablet by mouth daily.  , Disp: , Rfl: ;  nitroGLYCERIN (NITRO-DUR) 0.4 mg/hr, Patch on at night and off in am , Disp: , Rfl: ;  ONE TOUCH ULTRA TEST test strip, , Disp: , Rfl: ;  potassium chloride SA (K-DUR,KLOR-CON) 20 MEQ tablet, Take 20 mEq by mouth 2 (two) times daily. , Disp: , Rfl:  tiotropium (SPIRIVA) 18 MCG inhalation capsule, Place 18 mcg into inhaler and inhale daily.  , Disp: , Rfl: ;  torsemide (DEMADEX) 100 MG tablet, Take 50 mg by mouth 2 (two) times daily. , Disp: , Rfl: ;  traMADol (ULTRAM) 50 MG tablet, Take 50 mg by mouth 2 (two) times daily as needed.  , Disp: , Rfl: ;  warfarin (COUMADIN) 5 MG tablet, Take 5 mg by mouth daily. 5 mg on even days. 2.5 mg on odd days, Disp: , Rfl:  zolpidem (AMBIEN) 10  MG tablet, Take 10 mg by mouth at bedtime as needed.  , Disp: , Rfl: ;  doxycycline (VIBRA-TABS) 100 MG tablet, Take 1 tablet by mouth Twice daily., Disp: , Rfl:   Allergies as of 03/24/2012 - Review Complete 03/24/2012  Allergen Reaction Noted  . Clindamycin  05/17/2008  . Diphenhydramine hcl Swelling   . Fish allergy  05/29/2011  . Iohexol  07/19/2005  . Penicillins Other (See Comments)   . Povidone-iodine Other (See Comments)   . Promethazine hcl Other (See Comments)   . Tape  08/06/2011  . Morphine sulfate Nausea And Vomiting and Rash     1. Work and Family: There are no new pertinent issues. 2. Activities:  The patient is very inactive.  3. Smoking, alcohol, or drugs: None  4. Primary Care Provider: Margaree Mackintosh, MD, MD  ROS: There are no other significant  problems involving Kearstyn's other body systems.   Objective:  Vital Signs:  BP 139/91  Pulse 87  Wt 348 lb 3.2 oz (157.942 kg)   Ht Readings from Last 3 Encounters:  02/06/12 5\' 7"  (1.702 m)  09/07/11 5\' 7"  (1.702 m)  05/29/11 5\' 6"  (1.676 m)   Wt Readings from Last 3 Encounters:  03/24/12 348 lb 3.2 oz (157.942 kg)  02/06/12 312 lb (141.522 kg)  09/27/11 332 lb (150.594 kg)    PHYSICAL EXAM:  Constitutional: The patient appears  morbidly obese, tired, and worn out. She has regained all the weight she lost and more. Face: The face appears normal.  Eyes: There is no obvious arcus or proptosis. Moisture appears normal. Mouth: The oropharynx and tongue appear normal. Oral moisture is normal. Neck: The neck appears to be visibly normal. No carotid bruits are noted. The thyroid gland is  20+ grams in size.  The lobes are both mildly enlarged. Both lobes are tender to palpation. The consistency of the thyroid gland is  fairly normal.  Lungs: The lungs are clear to auscultation. Air movement is good. Heart:  She does not appear to be in atrial fibrillation. I did not appreciate any pathologic cardiac  murmurs. Abdomen: The abdomen  Is very much enlarged. Bowel sounds are normal. There is no obvious hepatomegaly, splenomegaly, or other mass effect.  Arms: Muscle size and bulk are normal for age. Hands: There is no obvious tremor. Phalangeal and metacarpophalangeal joints are normal. Palmar muscles are normal. Palmar skin is normal. Palmar moisture is also normal. Legs: Muscles appear normal for age.  Her legs are still quite enlarged. She has 2+ pedal edema.  Feet: She had one plus DP pulses. She has 1+ edema of her feet.  Neurologic: Strength is normal for age in the upper extremities, but she is somewhat weaker in the lower extremities. Muscle tone is normal. Sensation to touch is normal in both the legs and feet.  LAB DATA: Hemoglobin A1c today was 8.0%, compared to 7.1% at her last visit.          Lab results from 09/27/11 are as follows:   1. TSH was 3.183, free T4 1.72, free T3 2.6. In this case, when the current TFTs are compared to the prior TFTs in June, all 3 of the thyroid function tests increased in the same direction together. Although the TSH is higher, which would normally mean the patient was hypothyroid, the free T4 and free T3 are also higher. This phenomenon, when all 3 thyroid tests shift in the same direction upward or downward, is pathognomonic of a recent flare up of Hashimoto's thyroiditis.  2. Calcium was 9.4, PTH was 85, and 25 vitamin D was 58. There is no result shown for 1, 25 vitamin D. As I wrote in my note, I suspect that the patient missed several calcitriol doses. I have since spoken with her and asked her to make sure that she is taking her calcitriol, calcium, and vitamin D.          Lab results from 11/24/11 are as follows:  1. TSH was 3.996, Free T4 was 1.58, and Free T3 was 2.5.  2.  Calcium was 9., PTH was 85.5, 25-hydroxy vitamin D was 57, and 1,25-dihydroxy vitamin D was 41.     Assessment and Plan:   ASSESSMENT:  1.  Type 2 diabetes mellitus: The  patient's blood sugar control is worse again since 03/18/12. I suspect that she may have  a UTI or other infection. It is also possible that her production of insulin and C-peptide has finally dropped so low that she will require insulin treatment. 2.  Hypothyroidism: Her TSH was higher in November, so we changed her Synthroid to 75 mcg on even days and 62.5 mcg on odd days 3.Thyroiditis: Her thyroiditis is active today.  4.  Goiter: Thyroid gland is normal size or only slightly enlarged today.  5.  Edema: Her edema is worse today. 6.  Obesity: Her weight is much greater, but it is difficult to tell how much of this is tissue and how much of fluid.  7. Fatigue: Her fatigue and just feeling bad are worse. 8. Secondary hyperparathyroidism, deficiency of calcium and vitamin intake, and deficiency of 1, 25 vitamin D production: At last visit her calcium was at about the 40%, her PTH was elevated, and her 25-hydroxy vitamin D was normal. The 1,25 vitamin D had not been run. Labs in November showed a calcium at the 30%, PTH still elevated, and normal 25-Vitamin D and normal 1,25-Vitamin D. She needs more consistent vitamin D intake. 9. Pica: Her ice craving could be due to anemia after blood loss. 10. Leg ulcer: Looks clean. Will still need topical Bactroban ointment b.i.d until completely healed.  PLAN:  1. Diagnostic:  TFTs, CBC with diff, urinalysis and urine c&s, C-peptide, iron, calcium, PTH, 25 vitamin D, and 1, 25 vitamin D  2. Therapeutic:  If patient is not infected, she will need Lantus at HS. Apply Bactroban ointment to leg ulcers twice daily. 3. Patient education: We discussed the interactions between infection and BG control at length.  4. Follow-up: one week and 3 months  Level of Service: This visit lasted in excess of 60 minutes. More than 50% of the visit was devoted to counseling.   David Stall, MD 03/24/2012 11:53 AM

## 2012-03-25 LAB — CBC WITH DIFFERENTIAL/PLATELET
Basophils Relative: 0 % (ref 0–1)
Hemoglobin: 11.9 g/dL — ABNORMAL LOW (ref 12.0–15.0)
Lymphocytes Relative: 17 % (ref 12–46)
Lymphs Abs: 1.2 10*3/uL (ref 0.7–4.0)
Monocytes Relative: 5 % (ref 3–12)
Neutro Abs: 5.4 10*3/uL (ref 1.7–7.7)
Neutrophils Relative %: 77 % (ref 43–77)
RBC: 4.08 MIL/uL (ref 3.87–5.11)
WBC: 7 10*3/uL (ref 4.0–10.5)

## 2012-03-25 LAB — URINALYSIS
Bilirubin Urine: NEGATIVE
Nitrite: NEGATIVE
Specific Gravity, Urine: 1.011 (ref 1.005–1.030)
Urobilinogen, UA: 0.2 mg/dL (ref 0.0–1.0)
pH: 5.5 (ref 5.0–8.0)

## 2012-03-25 LAB — PTH, INTACT AND CALCIUM
Calcium, Total (PTH): 9.5 mg/dL (ref 8.4–10.5)
PTH: 32 pg/mL (ref 14.0–72.0)

## 2012-03-26 LAB — VITAMIN D 1,25 DIHYDROXY: Vitamin D2 1, 25 (OH)2: 16 pg/mL

## 2012-03-27 ENCOUNTER — Ambulatory Visit (INDEPENDENT_AMBULATORY_CARE_PROVIDER_SITE_OTHER): Payer: Medicare Other | Admitting: "Endocrinology

## 2012-03-27 ENCOUNTER — Encounter: Payer: Self-pay | Admitting: "Endocrinology

## 2012-03-27 ENCOUNTER — Encounter (INDEPENDENT_AMBULATORY_CARE_PROVIDER_SITE_OTHER): Payer: Medicare Other | Admitting: Obstetrics and Gynecology

## 2012-03-27 ENCOUNTER — Telehealth: Payer: Self-pay | Admitting: Internal Medicine

## 2012-03-27 VITALS — BP 135/87 | HR 83 | Wt 336.3 lb

## 2012-03-27 DIAGNOSIS — Z87898 Personal history of other specified conditions: Secondary | ICD-10-CM

## 2012-03-27 DIAGNOSIS — N840 Polyp of corpus uteri: Secondary | ICD-10-CM

## 2012-03-27 DIAGNOSIS — E1169 Type 2 diabetes mellitus with other specified complication: Secondary | ICD-10-CM

## 2012-03-27 DIAGNOSIS — L97909 Non-pressure chronic ulcer of unspecified part of unspecified lower leg with unspecified severity: Secondary | ICD-10-CM

## 2012-03-27 DIAGNOSIS — E11622 Type 2 diabetes mellitus with other skin ulcer: Secondary | ICD-10-CM

## 2012-03-27 DIAGNOSIS — N950 Postmenopausal bleeding: Secondary | ICD-10-CM

## 2012-03-27 DIAGNOSIS — Z8619 Personal history of other infectious and parasitic diseases: Secondary | ICD-10-CM

## 2012-03-27 DIAGNOSIS — N159 Renal tubulo-interstitial disease, unspecified: Secondary | ICD-10-CM

## 2012-03-27 DIAGNOSIS — Z8679 Personal history of other diseases of the circulatory system: Secondary | ICD-10-CM

## 2012-03-27 HISTORY — DX: Personal history of other infectious and parasitic diseases: Z86.19

## 2012-03-27 HISTORY — DX: Personal history of other specified conditions: Z87.898

## 2012-03-27 HISTORY — DX: Personal history of other diseases of the circulatory system: Z86.79

## 2012-03-27 HISTORY — DX: Renal tubulo-interstitial disease, unspecified: N15.9

## 2012-03-27 LAB — GLUCOSE, POCT (MANUAL RESULT ENTRY): POC Glucose: 321

## 2012-03-27 MED ORDER — DOXYCYCLINE HYCLATE 100 MG PO TABS: 100.0000 mg | ORAL_TABLET | Freq: Two times a day (BID) | ORAL | Status: DC
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
## 2012-03-27 NOTE — Patient Instructions (Signed)
Follow-up visit on 03/31/12 as planned. Apply Bactroban 3-4 times daily to ulcer. Take doxycycline, 100 mg by mouth, twice daily for 10 days. Call me each evening between 8:30-10:000 PM at [phone].

## 2012-03-27 NOTE — Progress Notes (Signed)
Chief complaint: T2DM, left leg ulcer.  Follow-up visit from 03/24/12.  Subjective:  1. Due to a problem with e-prescribing her Bactroban on 03/24/12, she had not started it yet.  2. Leg ulcer is still draining. The area around the ulcer is thickened with scar tissue, but has not become red or painful. 3. She is taking Januvia, 100 mg/day and glyburide, 10 mg, twice daily. She stopped the glimeperide due to swelling. She was not able to tolerate metformin, Byetta, and Victoza.  Objective: BP: 135/87     HR: 83  Temperature: 97     Weight: 336 lbs Patient is alert, oriented X 3, and looks good overall. Her left shin ulcer measures approximately 1 x 1/2 cm. The rim of the ulcer has about a 2 cm diameter collar of scar and induration. The ulcer is draining serous fluid that looks somewhat cloudy.  Labs 03/24/12: C-peptide 7.74 (normal 0.0-3.90).  CMP: Normal, except glucose of 261, BUN 31, creatinine 1.34 CBC: WBC 7.0 (77% PMNS), Hgb 11.9, HCT 37.9% TSH 2.543, free T4 1.72, free T3 2.6 PTH 32, calcium 9.5, 25-hydroxy vitamin D 60, 1,25-dihydroxy vitamin D 48 U/A: clear, negative chemistries, no WBC Urine C&S: 25,000 e. Coli  Assessment:  1. Leg ulcer: I discussed her case with Dr. Lenord Fellers. Although the ulcer does not look overtly infected, it is draining cloudy fluid. Given her hyperglycemia, all of her other medical problems, and her tendency to develop infections quite easily, the conservative action is to treat her with both topical Bactroban and oral doxycycline, which is one of the few antibiotics that she can take without adverse effects.  2. T2DM: She is still making lots of insulin  But even this amount of insulin production can't control her BGs. It is also possible that her infected ulcer is causing even higher BGs. However, we need to get the BGs under better control in order for the ulcer to be treated successfully. At this point in time she needs insulin. Lantus will complement her  Januvia and glyburide. Considering her age and other medical problems, to include her heart disease and arrhythmias, we do not want to cause hypoglycemia. Therefore, we want to get reasonable control of her DM, without trying to achieve tight control.  Plan:  1. Diagnostic: No labs today 2. Therapeutic: Begin Lantus treatment at 4 units at bedtime. Use the "Small" bedtime snack plan.  Apply Bactroban to ulcer 3-4 times/day. Cover ulcer when needed with Telfa pads. Begin doxycycline, 100 mg, twice daily for 10 days. 3. Patient education: Use "Small" bedtime snack plan revised on 11/28/10. If BG < 76=40 gms carbs. BG 76-100+ 30 gms. BG 101-125 20 gms. BG 126-150=10 gms. BG 151-175=5 gms. BG> 175 = no snack 4. Follow-up: 03/31/12. Please also schedule a visit with Dr. Lenord Fellers next week.  Level of Service: This visit lasted in excess of 40 minutes. More than 50% of the visit was devoted to counseling.David Stall

## 2012-03-28 ENCOUNTER — Telehealth: Payer: Self-pay | Admitting: "Endocrinology

## 2012-03-28 LAB — CULTURE, URINE COMPREHENSIVE: Colony Count: 25000

## 2012-03-28 NOTE — Telephone Encounter (Signed)
Received telephone call from patient. 1. Overall status: She feels pretty good, but is tired. She saw her gyn yesterday afternoon. She has cervical polyps that should be removed, as part of a hysteroscopy and D&C. Left shin ulcer is still weeping some, but is not as red. Leg is less swollen overall.  2. New problems: none 3. Lantus dose: 4 4. Rapid-acting insulin: none 5. BG log: 2 AM, Breakfast, Lunch, Supper, Bedtime 03/27/12: xxx, xxx, 273, 301, 317 03/28/12: 245, 278, 260, 234 6. Assessment: Some improvement in BG control, without hypoglycemia 7. Plan: Increase Lantus to 8 units 8. FU call: tomorrow evening. David Stall

## 2012-03-29 ENCOUNTER — Telehealth: Payer: Self-pay | Admitting: "Endocrinology

## 2012-03-29 NOTE — Telephone Encounter (Signed)
Received telephone call from patient. 1. Overall status: "I'm getting there." 2. New problems: None 3. Lantus dose: 8 4. Rapid-acting insulin: none 5. BG log: 2 AM, Breakfast, Lunch, Supper, Bedtime 0010: 178, 0800: 203, 1410: 218, 1650: 263, 1930: 219, 2130: 184 6. Assessment: BGs are improved.  7. Plan: Increase Lantus to 10 units 8. FU call: tomorrow evening [name]

## 2012-03-30 ENCOUNTER — Telehealth: Payer: Self-pay | Admitting: "Endocrinology

## 2012-03-30 NOTE — Telephone Encounter (Signed)
Received telephone call from patient. 1. Overall status: She is "doing pretty good".The ulcer is better, looks like it is shrinking, but is still weeping. 2. New problems: none 3. Lantus dose: 10 4. Rapid-acting insulin: none 5. BG log: 2 AM, Breakfast, Lunch, Supper, Bedtime 0700: 209, 1500: 238, 1800: 188 (supper), 2115: 232 6. Assessment: Needs more insulin:  7. Plan: Increase the Lantus to 12 units. 8. FU call: tomorrow night [name]

## 2012-03-31 ENCOUNTER — Ambulatory Visit (INDEPENDENT_AMBULATORY_CARE_PROVIDER_SITE_OTHER): Payer: Medicare Other | Admitting: "Endocrinology

## 2012-03-31 ENCOUNTER — Encounter: Payer: Self-pay | Admitting: "Endocrinology

## 2012-03-31 VITALS — BP 135/89 | HR 86 | Temp 96.8°F | Wt 326.2 lb

## 2012-03-31 DIAGNOSIS — R609 Edema, unspecified: Secondary | ICD-10-CM

## 2012-03-31 DIAGNOSIS — L089 Local infection of the skin and subcutaneous tissue, unspecified: Secondary | ICD-10-CM

## 2012-03-31 DIAGNOSIS — E161 Other hypoglycemia: Secondary | ICD-10-CM

## 2012-03-31 DIAGNOSIS — R600 Localized edema: Secondary | ICD-10-CM

## 2012-03-31 DIAGNOSIS — L97909 Non-pressure chronic ulcer of unspecified part of unspecified lower leg with unspecified severity: Secondary | ICD-10-CM

## 2012-03-31 DIAGNOSIS — I10 Essential (primary) hypertension: Secondary | ICD-10-CM

## 2012-03-31 NOTE — Progress Notes (Signed)
Subjective:  Patient Name: [name] Date of Birth: 07/21/1950  MRN: [id number]  [name]  presents to the office today for follow-up of her insulin-requiring T2DM, leg edema, left shin ulcer, hypertension  HISTORY OF PRESENT ILLNESS:   [name] is a 62 y.o. Caucasian woman. [name] was accompanied by her husband.  1. The patient was started on Lantus insulin on 03/27/12 at a dose of 4 units. During the past three days we have gradually increased her Lantus insulin dose to 12 units as of last night. 2. Her left leg swelling and left shin ulcer continue to improve. Although the ulcer is still weeping, the fluid is clear. The ulcer has decreased in size by more than 50%. 3. Pertinent Review of Systems:  Constitutional: The patient feels better since her BGs have been decreasing.She is still somewhat tired, but is not nearly as sleepy as she was before starting Lantus.  Eyes: Vision is good. There are no significant eye complaints. Neck: The patient has no complaints of anterior neck swelling, soreness, tenderness,  pressure, discomfort, or difficulty swallowing.  Heart: She did have an episode of chest pain in her mid-chest yesterday. She took one NTG tablet and the pain resolved promptly. Heart rate increases with exercise or other physical activity. The patient was in and out of A-fib last night.  Gastrointestinal: Bowel movents seem normal. The patient has no complaints of excessive hunger, acid reflux, upset stomach, stomach aches or pains, diarrhea, or constipation. Legs: Muscle mass and strength seem normal. There are no complaints of numbness, tingling, burning, or pain. Edema is decreased in both legs.  Feet: There are no obvious foot problems. There are no complaints of numbness, tingling, burning, or pain. No edema is noted. GYN: She saw her GYN on 3/28 after she saw me. The GYN saw cervical polyps. She will have a polypectomy, hysteroscopy, and D&C in the near  future. Hypoglycemia: none 4. BG printout: Gradual reductio in BG during the past 4 days. Last night her HS BG was 184, the lowest HS BG in quite a while.    PAST MEDICAL, FAMILY, AND SOCIAL HISTORY:  Past Medical History  Diagnosis Date  . Personal history of other diseases of circulatory system   . Cardiac pacemaker in situ   . Chronic lymphocytic thyroiditis   . Type II or unspecified type diabetes mellitus without mention of complication, not stated as uncontrolled   . Morbid obesity   . Other chronic pulmonary heart diseases   . Unspecified essential hypertension   . Obstructive sleep apnea (adult) (pediatric)   . Other and unspecified hyperlipidemia   . Fibromyalgia   . Goiter   . Thyroiditis, autoimmune   . Hypothyroidism, acquired, autoimmune   . Solitary kidney, acquired   . Fatigue   . Hyperparathyroidism, secondary   . Vitamin D deficiency disease   . Diabetes mellitus type II   . Hyperparathyroidism     Family History  Problem Relation Age of Onset  . Cardiomyopathy Brother   . Hypertension    . Diabetes Mother   . Cancer Mother     Current outpatient prescriptions:albuterol (PROAIR HFA) 108 (90 BASE) MCG/ACT inhaler, Inhale 2 puffs into the lungs every 6 (six) hours as needed.  , Disp: , Rfl: ;  albuterol (PROVENTIL) (2.5 MG/3ML) 0.083% nebulizer solution, Take 2.5 mg by nebulization every 6 (six) hours as needed.  , Disp: , Rfl: ;  amiodarone (CORDARONE) 200 MG tablet, Take 300 mg by mouth  daily.  , Disp: , Rfl:  calcitRIOL (ROCALTROL) 0.25 MCG capsule, Take 1 capsule (0.25 mcg total) by mouth 2 (two) times daily., Disp: 60 capsule, Rfl: 6;  Calcium-Magnesium-Vitamin D (CITRACAL CALCIUM+D) 600-40-500 MG-MG-UNIT TB24, Take 1 tablet by mouth 2 (two) times daily. , Disp: , Rfl: ;  digoxin (LANOXIN) 0.25 MG tablet, Take 250 mcg by mouth daily.  , Disp: , Rfl:  doxycycline (VIBRA-TABS) 100 MG tablet, Take 1 tablet (100 mg total) by mouth 2 (two) times daily., Disp: 20  tablet, Rfl: 1;  DULoxetine (CYMBALTA) 60 MG capsule, Take 30 mg by mouth 2 (two) times daily. , Disp: , Rfl: ;  ergocalciferol (VITAMIN D2) 50000 UNITS capsule, Take 50,000 Units by mouth. Twice weekly, Disp: , Rfl: ;  glyBURIDE (DIABETA) 5 MG tablet, 2 tablets twice a day., Disp: 120 tablet, Rfl: 6 HYDROcodone-acetaminophen (LORCET 10/650) 10-650 MG per tablet, Take 1 tablet by mouth every 8 (eight) hours as needed for pain., Disp: 90 tablet, Rfl: 5;  JANUVIA 100 MG tablet, Take 1 tablet by mouth Daily., Disp: , Rfl: ;  levothyroxine (SYNTHROID, LEVOTHROID) 25 MCG tablet, Take  62.5 mcg on odd days and 75 mcg on even days, Disp: , Rfl: ;  metolazone (ZAROXOLYN) 5 MG tablet, as needed., Disp: , Rfl:  metoprolol tartrate (LOPRESSOR) 25 MG tablet, Take 25 mg by mouth 2 (two) times daily.  , Disp: , Rfl: ;  Multiple Vitamin (MULTIVITAMIN) tablet, Take 1 tablet by mouth daily.  , Disp: , Rfl: ;  mupirocin ointment (BACTROBAN) 2 %, , Disp: , Rfl: ;  nitroGLYCERIN (NITRO-DUR) 0.4 mg/hr, Patch on at night and off in am , Disp: , Rfl: ;  ONE TOUCH ULTRA TEST test strip, , Disp: , Rfl:  potassium chloride SA (K-DUR,KLOR-CON) 20 MEQ tablet, Take 20 mEq by mouth 2 (two) times daily. , Disp: , Rfl: ;  tiotropium (SPIRIVA) 18 MCG inhalation capsule, Place 18 mcg into inhaler and inhale daily.  , Disp: , Rfl: ;  torsemide (DEMADEX) 100 MG tablet, Take 50 mg by mouth 2 (two) times daily. , Disp: , Rfl: ;  traMADol (ULTRAM) 50 MG tablet, Take 50 mg by mouth 2 (two) times daily as needed.  , Disp: , Rfl:  warfarin (COUMADIN) 5 MG tablet, Take 5 mg by mouth daily. 5 mg on even days. 2.5 mg on odd days, Disp: , Rfl: ;  zolpidem (AMBIEN) 10 MG tablet, Take 10 mg by mouth at bedtime as needed.  , Disp: , Rfl:   Allergies as of 03/31/2012 - Review Complete 03/31/2012  Allergen Reaction Noted  . Clindamycin  05/17/2008  . Diphenhydramine hcl Swelling   . Fish allergy  05/29/2011  . Iohexol  07/19/2005  . Penicillins Other  (See Comments)   . Povidone-iodine Other (See Comments)   . Promethazine hcl Other (See Comments)   . Tape  08/06/2011  . Morphine sulfate Nausea And Vomiting and Rash     1. Work and Family: Spends a lot of her time back and forth to her many physicians. 2. Activities: Same 3. Smoking, alcohol, or drugs: none 4. Primary Care Provider: Margaree Mackintosh, MD, MD  ROS: There are no other significant problems involving [name]'s other body systems.   Objective:  Vital Signs:  BP 135/89  Pulse 86  Temp(Src) 96.8 F (36 C) (Oral)  Wt 326 lb 3.2 oz (147.963 kg)   Ht Readings from Last 3 Encounters:  02/06/12 5\' 7"  (1.702 m)  09/07/11 5\' 7"  (  1.702 m)  05/29/11 5\' 6"  (1.676 m)   Wt Readings from Last 3 Encounters:  03/31/12 326 lb 3.2 oz (147.963 kg)  03/27/12 336 lb 4.8 oz (152.545 kg)  03/24/12 348 lb 3.2 oz (157.942 kg)    There is no height on file to calculate BSA.  PHYSICAL EXAM:  Constitutional: The patient appears healthy and well nourished. She seems more alert and vibrant today. She definitely looks less tired. Face: The face appears normal.  Eyes: There is no obvious arcus or proptosis. Moisture appears normal. Mouth: The oropharynx and tongue appear normal. Oral moisture is normal. Neck: The neck appears to be visibly normal. No carotid bruits are noted. The thyroid gland is 20grams in size. The consistency of the thyroid gland is normal. The thyroid gland is not tender to palpation. Lungs: The lungs are clear to auscultation. Air movement is good. Heart: Heart rate and rhythm are irregularly irregular.Heart sounds S1 and S2 are normal. She has a GR II/VI SEM. I did not appreciate any pathologic cardiac murmurs. Abdomen: The abdomen is quite enlarged. Bowel sounds are normal. There is no obvious hepatomegaly, splenomegaly, or other mass effect. RLQ and LLQ are mildly tender. Arms: Muscle size and bulk are normal for age. Hands: There is no obvious tremor. Phalangeal and  metacarpophalangeal joints are normal. Palmar muscles are normal. Palmar skin is normal. Palmar moisture is also normal. Legs: Muscles appear normal for age. Leg edema is significantly reduced. Left shin ulcer has decreased in size by more than 50%. Current size is 12 x 8 mm. Ulcer is still draining fluid, but the fluid is clear. The collar of induration and swelling is much reduced. There is no peripheral cellulitis. Feet: Feet are normally formed. Edema is less. Neurologic: Strength is normal for age in the arms, but decreased in the hip flexors. Sensation to touch is normal in both the legs and feet.    LAB DATA:  Recent Results (from the past 504 hour(s))  GLUCOSE, POCT (MANUAL RESULT ENTRY)   Collection Time   03/24/12 11:00 AM      Component Value Range   POC Glucose 291    POCT GLYCOSYLATED HEMOGLOBIN (HGB A1C)   Collection Time   03/24/12 11:03 AM      Component Value Range   Hemoglobin A1C 8.0    COMPREHENSIVE METABOLIC PANEL   Collection Time   03/24/12 12:12 PM      Component Value Range   Sodium 136  135 - 145 (mEq/L)   Potassium 4.2  3.5 - 5.3 (mEq/L)   Chloride 96  96 - 112 (mEq/L)   CO2 30  19 - 32 (mEq/L)   Glucose, Bld 261 (*) 70 - 99 (mg/dL)   BUN 31 (*) 6 - 23 (mg/dL)   Creat 4.09 (*) 8.11 - 1.10 (mg/dL)   Total Bilirubin 0.9  0.3 - 1.2 (mg/dL)   Alkaline Phosphatase 69  39 - 117 (U/L)   AST 15  0 - 37 (U/L)   ALT 14  0 - 35 (U/L)   Total Protein 7.1  6.0 - 8.3 (g/dL)   Albumin 3.7  3.5 - 5.2 (g/dL)   Calcium 9.5  8.4 - 91.4 (mg/dL)  CBC WITH DIFFERENTIAL   Collection Time   03/24/12 12:12 PM      Component Value Range   WBC 7.0  4.0 - 10.5 (K/uL)   RBC 4.08  3.87 - 5.11 (MIL/uL)   Hemoglobin 11.9 (*) 12.0 - 15.0 (g/dL)  HCT 37.9  36.0 - 46.0 (%)   MCV 92.9  78.0 - 100.0 (fL)   MCH 29.2  26.0 - 34.0 (pg)   MCHC 31.4  30.0 - 36.0 (g/dL)   RDW 44.3 (*) 15.4 - 15.5 (%)   Platelets 204  150 - 400 (K/uL)   Neutrophils Relative 77  43 - 77 (%)   Neutro Abs  5.4  1.7 - 7.7 (K/uL)   Lymphocytes Relative 17  12 - 46 (%)   Lymphs Abs 1.2  0.7 - 4.0 (K/uL)   Monocytes Relative 5  3 - 12 (%)   Monocytes Absolute 0.4  0.1 - 1.0 (K/uL)   Eosinophils Relative 1  0 - 5 (%)   Eosinophils Absolute 0.0  0.0 - 0.7 (K/uL)   Basophils Relative 0  0 - 1 (%)   Basophils Absolute 0.0  0.0 - 0.1 (K/uL)   Smear Review Criteria for review not met    T3, FREE   Collection Time   03/24/12 12:12 PM      Component Value Range   T3, Free 2.6  2.3 - 4.2 (pg/mL)  T4, FREE   Collection Time   03/24/12 12:12 PM      Component Value Range   Free T4 1.72  0.80 - 1.80 (ng/dL)  TSH   Collection Time   03/24/12 12:12 PM      Component Value Range   TSH 2.543  0.350 - 4.500 (uIU/mL)  PTH, INTACT AND CALCIUM   Collection Time   03/24/12 12:12 PM      Component Value Range   PTH 32.0  14.0 - 72.0 (pg/mL)   Calcium, Total (PTH) 9.5  8.4 - 10.5 (mg/dL)  VITAMIN D 25 HYDROXY   Collection Time   03/24/12 12:12 PM      Component Value Range   Vit D, 25-Hydroxy 60  30 - 89 (ng/mL)  VITAMIN D 1,25 DIHYDROXY   Collection Time   03/24/12 12:12 PM      Component Value Range   Vitamin D 1, 25 (OH) Total 48  18 - 72 (pg/mL)   Vitamin D3 1, 25 (OH) 32     Vitamin D2 1, 25 (OH) 16    C-PEPTIDE   Collection Time   03/24/12 12:12 PM      Component Value Range   C-Peptide 7.74 (*) 0.80 - 3.90 (ng/mL)  URINALYSIS   Collection Time   03/24/12 12:12 PM      Component Value Range   Color, Urine YELLOW  YELLOW    APPearance CLEAR  CLEAR    Specific Gravity, Urine 1.011  1.005 - 1.030    pH 5.5  5.0 - 8.0    Glucose, UA NEG  NEG (mg/dL)   Bilirubin Urine NEG  NEG    Ketones, ur NEG  NEG (mg/dL)   Hgb urine dipstick NEG  NEG    Protein, ur NEG  NEG (mg/dL)   Urobilinogen, UA 0.2  0.0 - 1.0 (mg/dL)   Nitrite NEG  NEG    Leukocytes, UA NEG  NEG   CULTURE, URINE COMPREHENSIVE   Collection Time   03/24/12 12:12 PM      Component Value Range   Culture       Value: ESCHERICHIA  COLI     ENTEROCOCCUS SPECIES   Colony Count 25,000 COLONIES/ML     Organism ID, Bacteria ESCHERICHIA COLI     Organism ID, Bacteria ENTEROCOCCUS SPECIES    GLUCOSE, POCT (  MANUAL RESULT ENTRY)   Collection Time   03/27/12 10:52 AM      Component Value Range   POC Glucose 321    GLUCOSE, POCT (MANUAL RESULT ENTRY)   Collection Time   03/31/12  8:05 AM      Component Value Range   POC Glucose 200       Assessment and Plan:   ASSESSMENT:  1. Insulin-requiring T2DM: The patient's C-peptide is about twice the ULN or this test. She is still producing lots of insulin. Unfortunately, the insulin resistance cause by fat cell cytokines is overpowering the own ability to make insulin. It's possible that her recent leg ulcer infection may have contributed to the overall level of insulin resistance. However, it is clear that she now needs added Lantus insulin to help control her BGs on a 24-hour basis. Her BG response to the increasing Lantus doses has been good thus far. Her subjective improvement in symptoms has improved in parallel. 2. Infected leg ulcer: The combination of antibiotics, diuretics, and lower BGs have worked together to allow the ulcer to shrink in size and for the local inflammation to substantially improve. She is not yet healed, but is significantly better. 3. Hypoglycemia: She has not yet become truly hypoglycemic, but she has had some neuroglycopenic symptoms. These will resolve as the production of glut-4 transporters in brain cells increases.  4. Leg edema: much improved 5. Hypertension: Essentially unchanged 6.Abdominal pains: Her GYN believes that the lower quadrant pains may be due to the cervical polyps.   PLAN:  1. Diagnostic: Repeat the CMP in one week. Make FU appointment with Dr. Lenord Fellers later this week to determine is an extended course of antibiotics is warranted. 2. Therapeutic: Finish course of antibiotics. Continue conservative car of the leg ulcer. Watch salt  intake tightly. Gradually increase the Lantus dose. Increase the Lantus dose to 14 units tonight. Call me tomorrow night with BG results. 3. Patient education: Until she can lose significant amounts of weight, she will need to continue the insulin and oral agent therapies.  4. Follow-up: 2 weeks  Level of Service: This visit lasted in excess of 40 minutes. More than 50% of the visit was devoted to counseling.  David Stall, MD 03/31/2012 8:22 AM

## 2012-03-31 NOTE — Patient Instructions (Addendum)
Follow-up visit in two weeks. Call me on April 2nd and again on April 10th to discuss BG results. Please make a FU visit with Dr. Lenord Fellers later this week if possible. Please increase Lantus dose to 14 units as of tonight. Watch intake of starches, sugars, and salt. Please repeat CMP in one week.

## 2012-04-01 ENCOUNTER — Telehealth: Payer: Self-pay | Admitting: "Endocrinology

## 2012-04-01 NOTE — Telephone Encounter (Signed)
Received telephone call from patient. 1. Overall status: "I'm doing OK". Left leg ulcer stopped oozing today. No redness exists. 2. New problems: She's been real tired. 3. Lantus dose: 14 4. Rapid-acting insulin: none 5. BG log: 2 AM, Breakfast, Lunch, Supper, Bedtime 03/31/12: xxx, 223/220, xxx, 239 no carbs for dinner, 176 nausea/116/116 1/2 slice of bread/182 04/01/12: xxx, 194, 200/212 very tired, 209 no carbs, 222 6. Assessment: BGs are better. She needs a few more days for her glut-4 transporters in brain cells to normalize. 7. Plan: continue current plan. 8. FU call: Sunday night

## 2012-04-04 ENCOUNTER — Ambulatory Visit (INDEPENDENT_AMBULATORY_CARE_PROVIDER_SITE_OTHER): Payer: Medicare Other | Admitting: Internal Medicine

## 2012-04-04 ENCOUNTER — Encounter: Payer: Self-pay | Admitting: Internal Medicine

## 2012-04-04 VITALS — BP 116/76 | HR 76 | Temp 98.1°F | Wt 326.0 lb

## 2012-04-04 DIAGNOSIS — I10 Essential (primary) hypertension: Secondary | ICD-10-CM

## 2012-04-04 DIAGNOSIS — E669 Obesity, unspecified: Secondary | ICD-10-CM

## 2012-04-04 DIAGNOSIS — N939 Abnormal uterine and vaginal bleeding, unspecified: Secondary | ICD-10-CM

## 2012-04-04 DIAGNOSIS — L97909 Non-pressure chronic ulcer of unspecified part of unspecified lower leg with unspecified severity: Secondary | ICD-10-CM

## 2012-04-04 DIAGNOSIS — Z8679 Personal history of other diseases of the circulatory system: Secondary | ICD-10-CM

## 2012-04-04 DIAGNOSIS — E119 Type 2 diabetes mellitus without complications: Secondary | ICD-10-CM

## 2012-04-04 DIAGNOSIS — E1169 Type 2 diabetes mellitus with other specified complication: Secondary | ICD-10-CM

## 2012-04-04 DIAGNOSIS — Z950 Presence of cardiac pacemaker: Secondary | ICD-10-CM

## 2012-04-04 DIAGNOSIS — N926 Irregular menstruation, unspecified: Secondary | ICD-10-CM

## 2012-04-04 DIAGNOSIS — L97929 Non-pressure chronic ulcer of unspecified part of left lower leg with unspecified severity: Secondary | ICD-10-CM

## 2012-04-04 DIAGNOSIS — E039 Hypothyroidism, unspecified: Secondary | ICD-10-CM

## 2012-04-04 NOTE — Progress Notes (Signed)
  Subjective:    Patient ID: [name], female    DOB: [birth year], 62 y.o.   MRN: [id number]  HPI 62 year old white female with history of diabetes mellitus, monoclonal gammopathy, hypertension, sleep apnea, fibromyalgia type pain, anxiety depression, hypothyroidism, paroxysmal atrial tachycardia with pacemaker for sick sinus syndrome in today with slow healing ulcer of left lower leg. Apparently struck it with a car door recently. Dr. Fransico Michael called last week and said it was draining serous fluid. It has not completely healed but still draining some serous fluid but it has healthy tissue around it. Also has had some recent issues with hyperglycemia that Dr. Fransico Michael has been trying to control. She also had a bout of  uterine bleeding and is being evaluated by Dr. Osborn Coho. She is to have hysteroscopy and D&C in the near future. Has been told she has uterine polyps.    Review of Systems     Objective:   Physical Exam has pink tissue about small ulcer on left anterior lower leg. Draining serous fluid. This was dressed in sterile fashion in the office today. She is to keep it covered until healed. She has about 3 doses of doxycycline left which was prescribed about a week ago. At first I thought that this might be the cause of her hyperglycemia but I think it is probably multifactorial at this point.        Assessment & Plan:  Healing leg ulcer left lower extremity  Hyperglycemia  Diabetes mellitus  Hypothyroidism  Morbid obesity  History of paroxysmal atrial tachycardia and history of pacemaker insertion for sick sinus syndrome  Uterine bleeding being evaluated by GYN  Plan: Patient has multiple upcoming physician appointments in the near future. I will check her iron and iron-binding capacity when she goes for lab work on Monday, April 8. Return as needed. Refill Xanax 0.5 mg Retzius #60) 1 by mouth twice daily as needed for anxiety with no refill. Have asked her to cut  Lorcet 10/650 back to q. 8 hours instead of Q6 hours.

## 2012-04-04 NOTE — Patient Instructions (Addendum)
Finish doses of doxycycline that you have on hand. Continue to clean ulcer with peroxide and apply Bactroban twice daily. Return as needed. Get iron and iron-binding capacity checked on Monday, April 8 with history of uterine bleeding. Followup with GYN physician, Dr. Fransico Michael, and Dr. Clarene Duke.

## 2012-04-07 ENCOUNTER — Encounter (INDEPENDENT_AMBULATORY_CARE_PROVIDER_SITE_OTHER): Payer: Medicare Other | Admitting: Obstetrics and Gynecology

## 2012-04-07 ENCOUNTER — Other Ambulatory Visit (INDEPENDENT_AMBULATORY_CARE_PROVIDER_SITE_OTHER): Payer: Medicare Other

## 2012-04-07 DIAGNOSIS — E669 Obesity, unspecified: Secondary | ICD-10-CM

## 2012-04-07 DIAGNOSIS — N950 Postmenopausal bleeding: Secondary | ICD-10-CM

## 2012-04-09 NOTE — Progress Notes (Signed)
Labs drawn

## 2012-04-10 ENCOUNTER — Other Ambulatory Visit: Payer: Self-pay | Admitting: Internal Medicine

## 2012-04-11 ENCOUNTER — Ambulatory Visit (INDEPENDENT_AMBULATORY_CARE_PROVIDER_SITE_OTHER): Payer: Medicare Other | Admitting: "Endocrinology

## 2012-04-11 ENCOUNTER — Encounter: Payer: Self-pay | Admitting: "Endocrinology

## 2012-04-11 VITALS — BP 142/92 | HR 72 | Wt 336.2 lb

## 2012-04-11 DIAGNOSIS — R609 Edema, unspecified: Secondary | ICD-10-CM

## 2012-04-11 DIAGNOSIS — E1169 Type 2 diabetes mellitus with other specified complication: Secondary | ICD-10-CM

## 2012-04-11 DIAGNOSIS — R600 Localized edema: Secondary | ICD-10-CM

## 2012-04-11 DIAGNOSIS — R109 Unspecified abdominal pain: Secondary | ICD-10-CM

## 2012-04-11 DIAGNOSIS — L97909 Non-pressure chronic ulcer of unspecified part of unspecified lower leg with unspecified severity: Secondary | ICD-10-CM

## 2012-04-11 DIAGNOSIS — E161 Other hypoglycemia: Secondary | ICD-10-CM

## 2012-04-11 DIAGNOSIS — I10 Essential (primary) hypertension: Secondary | ICD-10-CM

## 2012-04-11 DIAGNOSIS — E11649 Type 2 diabetes mellitus with hypoglycemia without coma: Secondary | ICD-10-CM

## 2012-04-11 DIAGNOSIS — E611 Iron deficiency: Secondary | ICD-10-CM

## 2012-04-11 DIAGNOSIS — L089 Local infection of the skin and subcutaneous tissue, unspecified: Secondary | ICD-10-CM

## 2012-04-11 LAB — CBC WITH DIFFERENTIAL/PLATELET
Basophils Absolute: 0 10*3/uL (ref 0.0–0.1)
Basophils Relative: 0 % (ref 0–1)
MCHC: 30.7 g/dL (ref 30.0–36.0)
Neutro Abs: 4.8 10*3/uL (ref 1.7–7.7)
Neutrophils Relative %: 79 % — ABNORMAL HIGH (ref 43–77)
RDW: 15.9 % — ABNORMAL HIGH (ref 11.5–15.5)

## 2012-04-11 LAB — GLUCOSE, POCT (MANUAL RESULT ENTRY): POC Glucose: 226

## 2012-04-11 LAB — COMPREHENSIVE METABOLIC PANEL
BUN: 33 mg/dL — ABNORMAL HIGH (ref 6–23)
CO2: 31 mEq/L (ref 19–32)
Calcium: 9.3 mg/dL (ref 8.4–10.5)
Chloride: 102 mEq/L (ref 96–112)
Creat: 1.19 mg/dL — ABNORMAL HIGH (ref 0.50–1.10)

## 2012-04-11 LAB — IRON AND TIBC
%SAT: 11 % — ABNORMAL LOW (ref 20–55)
Iron: 46 ug/dL (ref 42–145)
TIBC: 423 ug/dL (ref 250–470)

## 2012-04-11 NOTE — Progress Notes (Signed)
Subjective:  Patient Name: [name] Date of Birth: 1950-09-11  MRN: [id number]  [name]  presents to the office today for follow-up of her insulin-requiring T2DM, leg edema, left shin ulcer, hypertension, and edema.  HISTORY OF PRESENT ILLNESS:   [name] is a 62 y.o. Caucasian woman. [name] was accompanied by her husband.  1. The patient was started on Lantus insulin on 03/27/12 at a dose of 4 units. During the past two weeks we have gradually increased her Lantus insulin dose to 14 units as of 04/02/12. Although she still had one BG of 270 this week due to stress, her BGs have generally lower.  2. Her left leg swelling and left shin ulcer have significantly  improved. The ulcer stopped weeping 8 days ago. The ulcer has become progressively smaller and has almost totally re-epithelialized. The collar of induration has resolved.   3. Pertinent Review of Systems:  Constitutional: The patient generally feels better since her BGs have been decreasing. She has had some neuroglycopenic symptoms at BG values of 150 and 170.  She has been under a lot of stress and has not slept well for the past tow nights.  Eyes: Vision is good. There are no significant eye complaints. Neck: The patient has no complaints of anterior neck swelling, soreness, tenderness,  pressure, discomfort, or difficulty swallowing.  Heart: She had some A-fib a few days ago when she saw Dr. Clarene Duke. Her pacemaker is still working. She has not had any further chest pain.  Gastrointestinal: Bowel movents seem normal. She still has some RLQ pains. She also sometimes has some acid indigestion/dyspepsia/nausea if she doesn't eat and/or is stressed. The patient has no complaints of excessive hunger, acid reflux, upset stomach, stomach aches, diarrhea, or constipation. Legs: Muscle mass and strength seem normal. There are no complaints of numbness, tingling, burning, or pain. Edema is decreased in both legs.  Feet: There are no  obvious foot problems. There are no complaints of numbness, tingling, burning, or pain. No edema is noted. GYN: She saw her GYN, Dr. Su Hilt, on 04/07/12. after she saw me. The GYN saw cervical polyps. She will have a polypectomy, hysteroscopy, and D&C in the near future at [facility]. Hypoglycemia: none 4. BG printout: Gradual reductio in BG during the past 2 weeks. Her lowest BG was 142 at 3:13 AM this morning. Her highest BG was 270 at 3:26 PM on 04/09/12 when she was very stressed.   PAST MEDICAL, FAMILY, AND SOCIAL HISTORY:  Past Medical History  Diagnosis Date  . Personal history of other diseases of circulatory system   . Cardiac pacemaker in situ   . Chronic lymphocytic thyroiditis   . Type II or unspecified type diabetes mellitus without mention of complication, not stated as uncontrolled   . Morbid obesity   . Other chronic pulmonary heart diseases   . Unspecified essential hypertension   . Obstructive sleep apnea (adult) (pediatric)   . Other and unspecified hyperlipidemia   . Fibromyalgia   . Goiter   . Thyroiditis, autoimmune   . Hypothyroidism, acquired, autoimmune   . Solitary kidney, acquired   . Fatigue   . Hyperparathyroidism, secondary   . Vitamin D deficiency disease   . Diabetes mellitus type II   . Hyperparathyroidism     Family History  Problem Relation Age of Onset  . Cardiomyopathy Brother   . Hypertension    . Diabetes Mother   . Cancer Mother     Current outpatient prescriptions:albuterol (PROAIR  HFA) 108 (90 BASE) MCG/ACT inhaler, Inhale 2 puffs into the lungs every 6 (six) hours as needed.  , Disp: , Rfl: ;  albuterol (PROVENTIL) (2.5 MG/3ML) 0.083% nebulizer solution, Take 2.5 mg by nebulization every 6 (six) hours as needed.  , Disp: , Rfl: ;  amiodarone (CORDARONE) 200 MG tablet, Take 300 mg by mouth daily.  , Disp: , Rfl:  calcitRIOL (ROCALTROL) 0.25 MCG capsule, Take 1 capsule (0.25 mcg total) by mouth 2 (two) times daily., Disp: 60 capsule, Rfl: 6;   Calcium-Magnesium-Vitamin D (CITRACAL CALCIUM+D) 600-40-500 MG-MG-UNIT TB24, Take 1 tablet by mouth 2 (two) times daily. , Disp: , Rfl: ;  digoxin (LANOXIN) 0.25 MG tablet, Take 250 mcg by mouth daily.  , Disp: , Rfl: ;  DULoxetine (CYMBALTA) 60 MG capsule, Take 30 mg by mouth 2 (two) times daily. , Disp: , Rfl:  ergocalciferol (VITAMIN D2) 50000 UNITS capsule, Take 50,000 Units by mouth. Twice weekly, Disp: , Rfl: ;  glyBURIDE (DIABETA) 5 MG tablet, 2 tablets twice a day., Disp: 120 tablet, Rfl: 6;  HYDROcodone-acetaminophen (LORCET 10/650) 10-650 MG per tablet, Take 1 tablet by mouth every 8 (eight) hours as needed for pain., Disp: 90 tablet, Rfl: 5;  JANUVIA 100 MG tablet, Take 1 tablet by mouth Daily., Disp: , Rfl:  levothyroxine (SYNTHROID, LEVOTHROID) 25 MCG tablet, Take  62.5 mcg on odd days and 75 mcg on even days, Disp: , Rfl: ;  metolazone (ZAROXOLYN) 5 MG tablet, as needed., Disp: , Rfl: ;  metoprolol tartrate (LOPRESSOR) 25 MG tablet, Take 25 mg by mouth 2 (two) times daily.  , Disp: , Rfl: ;  Multiple Vitamin (MULTIVITAMIN) tablet, Take 1 tablet by mouth daily.  , Disp: , Rfl: ;  mupirocin ointment (BACTROBAN) 2 %, , Disp: , Rfl:  nitroGLYCERIN (NITRO-DUR) 0.4 mg/hr, Patch on at night and off in am. 1 patch every other day., Disp: , Rfl: ;  ONE TOUCH ULTRA TEST test strip, , Disp: , Rfl: ;  potassium chloride SA (K-DUR,KLOR-CON) 20 MEQ tablet, Take 20 mEq by mouth 2 (two) times daily. , Disp: , Rfl: ;  tiotropium (SPIRIVA) 18 MCG inhalation capsule, Place 18 mcg into inhaler and inhale daily.  , Disp: , Rfl:  torsemide (DEMADEX) 100 MG tablet, Take 50 mg by mouth 2 (two) times daily. , Disp: , Rfl: ;  traMADol (ULTRAM) 50 MG tablet, Take 50 mg by mouth 2 (two) times daily as needed.  , Disp: , Rfl: ;  warfarin (COUMADIN) 5 MG tablet, Take 5 mg by mouth daily. 5 mg on even days. 2.5 mg on odd days, Disp: , Rfl: ;  zolpidem (AMBIEN) 10 MG tablet, Take 10 mg by mouth at bedtime as needed.  , Disp: ,  Rfl:  doxycycline (VIBRA-TABS) 100 MG tablet, Take 1 tablet (100 mg total) by mouth 2 (two) times daily., Disp: 20 tablet, Rfl: 1  Allergies as of 04/11/2012 - Review Complete 04/11/2012  Allergen Reaction Noted  . Clindamycin  05/17/2008  . Diphenhydramine hcl Swelling   . Fish allergy  05/29/2011  . Iohexol  07/19/2005  . Penicillins Other (See Comments)   . Povidone-iodine Other (See Comments)   . Promethazine hcl Other (See Comments)   . Tape  08/06/2011  . Morphine sulfate Nausea And Vomiting and Rash     1. Work and Family: Spends a lot of her time back and forth to her many physicians. 2. Activities: Same 3. Smoking, alcohol, or drugs: none 4. Primary  Care Provider: Margaree Mackintosh, MD, MD 5. Cardiologist: Dr. Julieanne Manson 6. GYN: Dr. Osborn Coho  ROS: There are no other significant problems involving [name]'s other body systems.   Objective:  Vital Signs:  BP 142/92  Pulse 72  Wt 336 lb 3.2 oz (152.499 kg)   Ht Readings from Last 3 Encounters:  02/06/12 5\' 7"  (1.702 m)  09/07/11 5\' 7"  (1.702 m)  05/29/11 5\' 6"  (1.676 m)   Wt Readings from Last 3 Encounters:  04/11/12 336 lb 3.2 oz (152.499 kg)  04/04/12 326 lb (147.873 kg)  03/31/12 326 lb 3.2 oz (147.963 kg)    There is no height on file to calculate BSA.  PHYSICAL EXAM:  Constitutional: The patient appears healthy and well nourished. She seems more alert and more vibrant today. She definitely looks less tired. She is Acupuncturist. Face: The face appears normal.  Eyes: There is no obvious arcus or proptosis. Moisture appears normal. Mouth: The oropharynx and tongue appear normal. Oral moisture is somewhat dry. Neck: The neck appears to be visibly normal. No carotid bruits are noted. The thyroid gland is 20 grams in size. The consistency of the thyroid gland is normal. The thyroid gland is not tender to palpation. Lungs: The lungs are clear to auscultation. Air movement is good. Heart: Heart rate and rhythm  are irregularly irregular.Heart sounds S1 and S2 are normal. She has a GR II/VI SEM. She also has an S4. I did not appreciate any pathologic cardiac murmurs. Abdomen: The abdomen is quite enlarged. Bowel sounds are normal. There is no obvious hepatomegaly, splenomegaly, or other mass effect. RLQ is mildly tender. Arms: Muscle size and bulk are normal for age. Hands: There is no obvious tremor. Phalangeal and metacarpophalangeal joints are normal. Palmar muscles are normal. Palmar skin is normal. Palmar moisture is also normal. Legs: Muscles appear normal for age. Leg edema is significantly reduced, but is still 1-2+. Left shin ulcer has markedly decreased in size. Current size is 4 x 3 mm.The ulcer has almost totally re-epithelialized and is no longer draining fluid. The collar of induration and swelling has resolved. There is no peripheral cellulitis. Feet: Feet are normally formed. Edema is less. Neurologic: Strength is normal for age in the arms, but decreased in the hip flexors. Sensation to touch is normal in both legs.    LAB DATA:  Recent Results (from the past 504 hour(s))  GLUCOSE, POCT (MANUAL RESULT ENTRY)   Collection Time   03/24/12 11:00 AM      Component Value Range   POC Glucose 291    POCT GLYCOSYLATED HEMOGLOBIN (HGB A1C)   Collection Time   03/24/12 11:03 AM      Component Value Range   Hemoglobin A1C 8.0    COMPREHENSIVE METABOLIC PANEL   Collection Time   03/24/12 12:12 PM      Component Value Range   Sodium 136  135 - 145 (mEq/L)   Potassium 4.2  3.5 - 5.3 (mEq/L)   Chloride 96  96 - 112 (mEq/L)   CO2 30  19 - 32 (mEq/L)   Glucose, Bld 261 (*) 70 - 99 (mg/dL)   BUN 31 (*) 6 - 23 (mg/dL)   Creat 1.61 (*) 0.96 - 1.10 (mg/dL)   Total Bilirubin 0.9  0.3 - 1.2 (mg/dL)   Alkaline Phosphatase 69  39 - 117 (U/L)   AST 15  0 - 37 (U/L)   ALT 14  0 - 35 (U/L)   Total Protein 7.1  6.0 - 8.3 (g/dL)   Albumin 3.7  3.5 - 5.2 (g/dL)   Calcium 9.5  8.4 - 40.9 (mg/dL)  CBC  WITH DIFFERENTIAL   Collection Time   03/24/12 12:12 PM      Component Value Range   WBC 7.0  4.0 - 10.5 (K/uL)   RBC 4.08  3.87 - 5.11 (MIL/uL)   Hemoglobin 11.9 (*) 12.0 - 15.0 (g/dL)   HCT 81.1  91.4 - 78.2 (%)   MCV 92.9  78.0 - 100.0 (fL)   MCH 29.2  26.0 - 34.0 (pg)   MCHC 31.4  30.0 - 36.0 (g/dL)   RDW 95.6 (*) 21.3 - 15.5 (%)   Platelets 204  150 - 400 (K/uL)   Neutrophils Relative 77  43 - 77 (%)   Neutro Abs 5.4  1.7 - 7.7 (K/uL)   Lymphocytes Relative 17  12 - 46 (%)   Lymphs Abs 1.2  0.7 - 4.0 (K/uL)   Monocytes Relative 5  3 - 12 (%)   Monocytes Absolute 0.4  0.1 - 1.0 (K/uL)   Eosinophils Relative 1  0 - 5 (%)   Eosinophils Absolute 0.0  0.0 - 0.7 (K/uL)   Basophils Relative 0  0 - 1 (%)   Basophils Absolute 0.0  0.0 - 0.1 (K/uL)   Smear Review Criteria for review not met    T3, FREE   Collection Time   03/24/12 12:12 PM      Component Value Range   T3, Free 2.6  2.3 - 4.2 (pg/mL)  T4, FREE   Collection Time   03/24/12 12:12 PM      Component Value Range   Free T4 1.72  0.80 - 1.80 (ng/dL)  TSH   Collection Time   03/24/12 12:12 PM      Component Value Range   TSH 2.543  0.350 - 4.500 (uIU/mL)  PTH, INTACT AND CALCIUM   Collection Time   03/24/12 12:12 PM      Component Value Range   PTH 32.0  14.0 - 72.0 (pg/mL)   Calcium, Total (PTH) 9.5  8.4 - 10.5 (mg/dL)  VITAMIN D 25 HYDROXY   Collection Time   03/24/12 12:12 PM      Component Value Range   Vit D, 25-Hydroxy 60  30 - 89 (ng/mL)  VITAMIN D 1,25 DIHYDROXY   Collection Time   03/24/12 12:12 PM      Component Value Range   Vitamin D 1, 25 (OH) Total 48  18 - 72 (pg/mL)   Vitamin D3 1, 25 (OH) 32     Vitamin D2 1, 25 (OH) 16    C-PEPTIDE   Collection Time   03/24/12 12:12 PM      Component Value Range   C-Peptide 7.74 (*) 0.80 - 3.90 (ng/mL)  URINALYSIS   Collection Time   03/24/12 12:12 PM      Component Value Range   Color, Urine YELLOW  YELLOW    APPearance CLEAR  CLEAR    Specific Gravity,  Urine 1.011  1.005 - 1.030    pH 5.5  5.0 - 8.0    Glucose, UA NEG  NEG (mg/dL)   Bilirubin Urine NEG  NEG    Ketones, ur NEG  NEG (mg/dL)   Hgb urine dipstick NEG  NEG    Protein, ur NEG  NEG (mg/dL)   Urobilinogen, UA 0.2  0.0 - 1.0 (mg/dL)   Nitrite NEG  NEG    Leukocytes, UA  NEG  NEG   CULTURE, URINE COMPREHENSIVE   Collection Time   03/24/12 12:12 PM      Component Value Range   Culture       Value: ESCHERICHIA COLI     ENTEROCOCCUS SPECIES   Colony Count 25,000 COLONIES/ML     Organism ID, Bacteria ESCHERICHIA COLI     Organism ID, Bacteria ENTEROCOCCUS SPECIES    GLUCOSE, POCT (MANUAL RESULT ENTRY)   Collection Time   03/27/12 10:52 AM      Component Value Range   POC Glucose 321    GLUCOSE, POCT (MANUAL RESULT ENTRY)   Collection Time   03/31/12  8:05 AM      Component Value Range   POC Glucose 200    COMPREHENSIVE METABOLIC PANEL   Collection Time   04/10/12  5:40 PM      Component Value Range   Sodium 138  135 - 145 (mEq/L)   Potassium 4.4  3.5 - 5.3 (mEq/L)   Chloride 102  96 - 112 (mEq/L)   CO2 31  19 - 32 (mEq/L)   Glucose, Bld 235 (*) 70 - 99 (mg/dL)   BUN 33 (*) 6 - 23 (mg/dL)   Creat 1.61 (*) 0.96 - 1.10 (mg/dL)   Total Bilirubin 0.8  0.3 - 1.2 (mg/dL)   Alkaline Phosphatase 68  39 - 117 (U/L)   AST 14  0 - 37 (U/L)   ALT 13  0 - 35 (U/L)   Total Protein 6.9  6.0 - 8.3 (g/dL)   Albumin 3.4 (*) 3.5 - 5.2 (g/dL)   Calcium 9.3  8.4 - 04.5 (mg/dL)  IRON AND TIBC   Collection Time   04/10/12  5:46 PM      Component Value Range   Iron 46  42 - 145 (ug/dL)   UIBC 409  811 - 914 (ug/dL)   TIBC 782  956 - 213 (ug/dL)   %SAT 11 (*) 20 - 55 (%)  CBC WITH DIFFERENTIAL   Collection Time   04/10/12  5:46 PM      Component Value Range   WBC 6.1  4.0 - 10.5 (K/uL)   RBC 4.04  3.87 - 5.11 (MIL/uL)   Hemoglobin 11.5 (*) 12.0 - 15.0 (g/dL)   HCT 08.6  57.8 - 46.9 (%)   MCV 92.6  78.0 - 100.0 (fL)   MCH 28.5  26.0 - 34.0 (pg)   MCHC 30.7  30.0 - 36.0 (g/dL)    RDW 62.9 (*) 52.8 - 15.5 (%)   Platelets 164  150 - 400 (K/uL)   Neutrophils Relative 79 (*) 43 - 77 (%)   Neutro Abs 4.8  1.7 - 7.7 (K/uL)   Lymphocytes Relative 16  12 - 46 (%)   Lymphs Abs 1.0  0.7 - 4.0 (K/uL)   Monocytes Relative 4  3 - 12 (%)   Monocytes Absolute 0.3  0.1 - 1.0 (K/uL)   Eosinophils Relative 1  0 - 5 (%)   Eosinophils Absolute 0.0  0.0 - 0.7 (K/uL)   Basophils Relative 0  0 - 1 (%)   Basophils Absolute 0.0  0.0 - 0.1 (K/uL)   Smear Review Criteria for review not met    GLUCOSE, POCT (MANUAL RESULT ENTRY)   Collection Time   04/11/12  9:03 AM      Component Value Range   POC Glucose 226       Assessment and Plan:   ASSESSMENT:  1. Insulin-requiring T2DM:  The patient's C-peptide is about twice the ULN for this test. She is still producing lots of insulin. Unfortunately, the insulin resistance cause by fat cell cytokines is overpowering her own ability to make insulin. It's possible that her recent leg ulcer infection may have contributed to the overall level of insulin resistance. However, it is clear that she now needs added Lantus insulin to help control her BGs on a 24-hour basis. Her BG response to the increasing Lantus doses has been good thus far. Her subjective improvement in symptoms has improved in parallel. 2. Hypoglycemia/neuroglycopenia: Although the patient has not had any true hypoglycemia, she has had neuroglycopenia when her BGs have dropped rapidly to the 150-170 range. She is more likely to have a fast drop in BG if she doesn't eat and is active. As we gradually but progressively reduce her BG levels, she will not have as much neuroglycopenia.  3. Infected leg ulcer: The combination of antibiotics, diuretics, lower BGs, and tincture of time have worked together to allow the ulcer to shrink in size and for the local inflammation to substantially improve. She is not yet healed, but is within days of fully re-epithelializing. She needs to protect this area  for at least another two weeks. 4. Leg edema: Edema is much improved. However, she still has a somewhat low albumin and somewhat low hemoglobin. Improvement in either or both of these proteins would result in higher oncotic pressures. 5. Hypertension: Essentially unchanged 6. Abdominal pains: Her GYN believes that the lower quadrant pains may be due to the cervical polyps.  7. Iron deficiency: By the lab normals, her iron level is low-normal. Since hematologists usually accept 70 as the lower limit of normal, her iron level is actually low. Her hemoglobin is mildly low. Her hematocrit is at the lower  Limit of normal. Her MCV is mid-normal, which may indicate that she has relative insufficiencies of iron, B12, and folate. She is already taking Centrum Silver day. Converting her to [facility] would add more iron.   PLAN:  1. Diagnostic: continue to check BGs twice a day, sometimes in morning, at lunchtime, at supper time, and at bedtime.  2. Therapeutic: Continue conservative care of the leg ulcer. Watch salt intake tightly. Gradually increase the Lantus dose by one unit every 4 days until most AM BGs are in the 80-120 range, unless she has more BGs <80 during the days.   3. Patient education: Until she can lose significant amounts of weight, she will need to continue the insulin and oral agent therapies.  4. Follow-up: 2 weeks  Level of Service: This visit lasted in excess of 40 minutes. More than 50% of the visit was devoted to counseling.  David Stall, MD 04/11/2012 9:50 AM

## 2012-04-11 NOTE — Patient Instructions (Signed)
Followup visit in approximately 2 weeks. I can see her as an add-on at 8 AM on Thursday the 25th.

## 2012-04-14 ENCOUNTER — Encounter (HOSPITAL_COMMUNITY): Payer: Self-pay

## 2012-04-22 ENCOUNTER — Other Ambulatory Visit: Payer: Self-pay | Admitting: Obstetrics and Gynecology

## 2012-04-23 ENCOUNTER — Telehealth: Payer: Self-pay

## 2012-04-23 NOTE — Telephone Encounter (Signed)
Centrum Silver is likely not enough iron. Needs 325 mg twice daily until 3 weeks after surgery then recheck. Please call her.

## 2012-04-23 NOTE — Telephone Encounter (Signed)
Sp w/pt @ [phone].  Per Dr. Lenord Fellers, take 325mg  Iron until 3 weeks post surgery.  Then re-check Iron and TIBC.  Gave pt DOS 05/29/12 @ 0915 (fasting).  Pt verbalizes understanding.

## 2012-04-24 ENCOUNTER — Encounter: Payer: Self-pay | Admitting: "Endocrinology

## 2012-04-24 ENCOUNTER — Encounter: Payer: Medicare Other | Admitting: "Endocrinology

## 2012-04-24 ENCOUNTER — Ambulatory Visit (INDEPENDENT_AMBULATORY_CARE_PROVIDER_SITE_OTHER): Payer: Medicare Other | Admitting: "Endocrinology

## 2012-04-24 ENCOUNTER — Ambulatory Visit: Payer: Medicare Other | Admitting: Internal Medicine

## 2012-04-24 VITALS — BP 133/86 | HR 79 | Wt 322.0 lb

## 2012-04-24 DIAGNOSIS — L97909 Non-pressure chronic ulcer of unspecified part of unspecified lower leg with unspecified severity: Secondary | ICD-10-CM

## 2012-04-24 DIAGNOSIS — E11649 Type 2 diabetes mellitus with hypoglycemia without coma: Secondary | ICD-10-CM

## 2012-04-24 DIAGNOSIS — E611 Iron deficiency: Secondary | ICD-10-CM

## 2012-04-24 DIAGNOSIS — L089 Local infection of the skin and subcutaneous tissue, unspecified: Secondary | ICD-10-CM

## 2012-04-24 DIAGNOSIS — R109 Unspecified abdominal pain: Secondary | ICD-10-CM

## 2012-04-24 DIAGNOSIS — R609 Edema, unspecified: Secondary | ICD-10-CM

## 2012-04-24 DIAGNOSIS — E1169 Type 2 diabetes mellitus with other specified complication: Secondary | ICD-10-CM

## 2012-04-24 DIAGNOSIS — I10 Essential (primary) hypertension: Secondary | ICD-10-CM

## 2012-04-24 DIAGNOSIS — R600 Localized edema: Secondary | ICD-10-CM

## 2012-04-24 LAB — GLUCOSE, POCT (MANUAL RESULT ENTRY): POC Glucose: 163

## 2012-04-24 MED ORDER — INSULIN GLARGINE 100 UNIT/ML ~~LOC~~ SOLN: 18.0000 [IU] | Freq: Every day | SUBCUTANEOUS | Status: DC

## 2012-04-24 NOTE — Patient Instructions (Addendum)
1. Follow up visit in 4 weeks. 2. On the evening of May 5th, reduce Lantus dose by 50%. 3. On the morning of May 6th, hold all oral DM meds. 4. On the evening of May 6th, if you are eating normally, resume your usual Lantus dose. If eating, but not back to normal, take 75% of your usual Lantus dose. Then resume your usual Lantus dose once you are back to eating normally. 5. On May 7th, resume oral DM meds.

## 2012-04-24 NOTE — Progress Notes (Addendum)
Subjective:  Patient Name: [name] Date of Birth: [birth year]  MRN: [id number]  [name]  presents to the office today for follow-up of her insulin-requiring T2DM, leg edema, left shin ulcer, hypertension, and edema.  HISTORY OF PRESENT ILLNESS:   [name] is a 62 y.o. Caucasian woman. [name] was accompanied by her husband.  1. The patient was started on Lantus insulin on 03/27/12 at a dose of 4 units. During the past four weeks we have gradually increased her Lantus insulin dose to 18 units. The patient's last [facility] visit was on 04/11/12. She had one BG of 270 the next morning. All of her BGs since then have been </= to 187.  2. Her left leg swelling and left shin ulcer have almost totally resolved. The collar of induration has resolved.  Dr. Assunta Found put her on iron, 325 mg, twice daily.  3. Pertinent Review of Systems:  Constitutional: The patient generally feels better since her BGs have been decreasing. She has not had any neuro glycopenic symptoms recently. She still does not sleep well, despite her CPAP. Eyes: Vision is good. There are no significant eye complaints. Neck: The patient has no complaints of anterior neck swelling, soreness, tenderness,  pressure, discomfort, or difficulty swallowing.  Heart: She had some A-fib earlier today. Her pacemaker is still working. She has not had any further chest pain.  Gastrointestinal: Bowel movents seem normal. She still has some RLQ pains. She no longer has her acid indigestion symptoms.  Legs: Muscle mass and strength seem normal. There are no complaints of numbness, tingling, burning, or pain. Edema is decreased in both legs.  Feet: There are no obvious foot problems. There are no complaints of numbness, tingling, burning, or pain. No edema is noted. GYN: She saw her GYN, Dr. Su Hilt, on 04/07/12. She will have a cervical polypectomy, hysteroscopy, and D&C on May th. Hypoglycemia: none 4. BG printout: Gradual reductio in BG during  the past 2 weeks. Her lowest BG was 142 at 10:55 AM on 04/21/12. Her highest BG was 187  at 5:22 PM on 04/14/12. BGs for the past 4 days were 143-166.   PAST MEDICAL, FAMILY, AND SOCIAL HISTORY:  Past Medical History  Diagnosis Date  . Personal history of other diseases of circulatory system   . Cardiac pacemaker in situ   . Chronic lymphocytic thyroiditis   . Type II or unspecified type diabetes mellitus without mention of complication, not stated as uncontrolled   . Morbid obesity   . Other chronic pulmonary heart diseases   . Unspecified essential hypertension   . Obstructive sleep apnea (adult) (pediatric)   . Other and unspecified hyperlipidemia   . Fibromyalgia   . Goiter   . Thyroiditis, autoimmune   . Hypothyroidism, acquired, autoimmune   . Solitary kidney, acquired   . Fatigue   . Hyperparathyroidism, secondary   . Vitamin D deficiency disease   . Diabetes mellitus type II   . Hyperparathyroidism     Family History  Problem Relation Age of Onset  . Cardiomyopathy Brother   . Hypertension    . Diabetes Mother   . Cancer Mother     Current outpatient prescriptions:albuterol (PROAIR HFA) 108 (90 BASE) MCG/ACT inhaler, Inhale 2 puffs into the lungs every 6 (six) hours as needed.  , Disp: , Rfl: ;  amiodarone (CORDARONE) 200 MG tablet, Take 300 mg by mouth daily. Takes 1 and 1/2 tablet daily., Disp: , Rfl: ;  calcitRIOL (ROCALTROL) 0.25 MCG capsule,  Take 1 capsule (0.25 mcg total) by mouth 2 (two) times daily., Disp: 60 capsule, Rfl: 6 Calcium-Magnesium-Vitamin D (CITRACAL CALCIUM+D) 600-40-500 MG-MG-UNIT TB24, Take 1 tablet by mouth 2 (two) times daily. , Disp: , Rfl: ;  digoxin (LANOXIN) 0.25 MG tablet, Take 250 mcg by mouth daily.  , Disp: , Rfl: ;  DULoxetine (CYMBALTA) 30 MG capsule, Take 30 mg by mouth daily. Occasionally takes twice daily if in pain from fibromyalgia., Disp: , Rfl:  ergocalciferol (VITAMIN D2) 50000 UNITS capsule, Take 50,000 Units by mouth. Twice  weekly-- Tuesday and Friday, Disp: , Rfl: ;  glyBURIDE (DIABETA) 5 MG tablet, 2 tablets twice a day., Disp: 120 tablet, Rfl: 6;  HYDROcodone-acetaminophen (LORCET 10/650) 10-650 MG per tablet, Take 1 tablet by mouth every 8 (eight) hours as needed for pain., Disp: 90 tablet, Rfl: 5 insulin glargine (LANTUS SOLOSTAR) 100 UNIT/ML injection, Inject 18 Units into the skin at bedtime. Dr. Fransico Ceairra Mccarver increasingly slowly.  Check regimen for hospital stay., Disp: , Rfl: ;  JANUVIA 100 MG tablet, Take 1 tablet by mouth Daily., Disp: , Rfl: ;  levothyroxine (SYNTHROID, LEVOTHROID) 25 MCG tablet, Take  62.5 mcg on odd days and 75 mcg on even days, Disp: , Rfl:  metolazone (ZAROXOLYN) 5 MG tablet, Take 5 mg by mouth as needed. For edema, Disp: , Rfl: ;  metoprolol tartrate (LOPRESSOR) 25 MG tablet, Take 25 mg by mouth 2 (two) times daily.  , Disp: , Rfl: ;  Multiple Vitamin (MULTIVITAMIN) tablet, Take 1 tablet by mouth daily.  , Disp: , Rfl: ;  mupirocin ointment (BACTROBAN) 2 %, Apply 1 application topically 2 (two) times daily. To leg blister , Disp: , Rfl:  nitroGLYCERIN (NITRO-DUR) 0.4 mg/hr, Patch on at night and off in am. 1 patch every other day., Disp: , Rfl: ;  ONE TOUCH ULTRA TEST test strip, , Disp: , Rfl: ;  potassium chloride SA (K-DUR,KLOR-CON) 20 MEQ tablet, Take 20 mEq by mouth 2 (two) times daily. , Disp: , Rfl: ;  tiotropium (SPIRIVA) 18 MCG inhalation capsule, Place 18 mcg into inhaler and inhale daily.  , Disp: , Rfl:  torsemide (DEMADEX) 100 MG tablet, Take 50 mg by mouth 2 (two) times daily. , Disp: , Rfl: ;  warfarin (COUMADIN) 5 MG tablet, Take 5 mg by mouth daily. 5 mg on even days. 2.5 mg on odd days Pacemaker- complete heart block and AF, Disp: , Rfl: ;  zolpidem (AMBIEN) 10 MG tablet, Take 10 mg by mouth at bedtime as needed. sleep, Disp: , Rfl:   Allergies as of 04/24/2012 - Review Complete 04/24/2012  Allergen Reaction Noted  . Diphenhydramine hcl Swelling   . Fish allergy  05/29/2011  .  Iohexol  07/19/2005  . Penicillins Other (See Comments)   . Povidone-iodine Other (See Comments)   . Promethazine hcl Other (See Comments)   . Tape  08/06/2011  . Clindamycin Rash 05/17/2008  . Morphine sulfate Nausea And Vomiting and Rash     1. Work and Family: Spends a lot of her time back and forth to her many physicians. 2. Activities: Same 3. Smoking, alcohol, or drugs: none 4. Primary Care Provider: Margaree Mackintosh, MD, MD 5. Cardiologist: Dr. Julieanne Manson 6. GYN: Dr. Osborn Coho  ROS: There are no other significant problems involving [name] other body systems.   Objective:  Vital Signs:  BP 133/86  Pulse 79  Wt 322 lb (146.058 kg)   Ht Readings from Last 3 Encounters:  02/06/12 5'  7" (1.702 m)  09/07/11 5\' 7"  (1.702 m)  05/29/11 5\' 6"  (1.676 m)   Wt Readings from Last 3 Encounters:  04/24/12 322 lb (146.058 kg)  04/11/12 336 lb 3.2 oz (152.499 kg)  04/04/12 326 lb (147.873 kg)    There is no height on file to calculate BSA.  PHYSICAL EXAM:  Constitutional: The patient appears healthy and well nourished. She is alert and bright today. She really looks good today.  Face: The face appears normal.  Eyes: There is no obvious arcus or proptosis. Moisture appears normal. Mouth: The oropharynx and tongue appear normal. Oral moisture is good today. Neck: The neck appears to be visibly normal. No carotid bruits are noted. The thyroid gland is 20 grams in size. The consistency of the thyroid gland is normal. The thyroid gland is not tender to palpation. Lungs: The lungs are clear to auscultation. Air movement is good. Heart: Heart rate and rhythm are occasionally irregular. Heart sounds S1 and S2 are normal. She also has an S1. I did not appreciate any pathologic cardiac murmurs. Abdomen: The abdomen is quite enlarged. Bowel sounds are normal. There is no obvious hepatomegaly, splenomegaly, or other mass effect. RLQ is mildly tender. Arms: Muscle size and bulk are  normal for age. Hands: There is no obvious tremor. Phalangeal and metacarpophalangeal joints are normal. Palmar muscles are normal. Palmar skin is normal. Palmar moisture is also normal. Legs: Muscles appear normal for age. Leg edema is significantly reduced, but is still 1-2+. Left shin ulcer has healed. Feet: Feet are normally formed. Edema is less. Neurologic: Strength is normal for age in the arms, but decreased in the hip flexors. Sensation to touch is normal in both legs and dorsa of the feet.Marland Kitchen    LAB DATA:  Recent Results (from the past 504 hour(s))  COMPREHENSIVE METABOLIC PANEL   Collection Time   04/10/12  5:40 PM      Component Value Range   Sodium 138  135 - 145 (mEq/L)   Potassium 4.4  3.5 - 5.3 (mEq/L)   Chloride 102  96 - 112 (mEq/L)   CO2 31  19 - 32 (mEq/L)   Glucose, Bld 235 (*) 70 - 99 (mg/dL)   BUN 33 (*) 6 - 23 (mg/dL)   Creat 4.78 (*) 2.95 - 1.10 (mg/dL)   Total Bilirubin 0.8  0.3 - 1.2 (mg/dL)   Alkaline Phosphatase 68  39 - 117 (U/L)   AST 14  0 - 37 (U/L)   ALT 13  0 - 35 (U/L)   Total Protein 6.9  6.0 - 8.3 (g/dL)   Albumin 3.4 (*) 3.5 - 5.2 (g/dL)   Calcium 9.3  8.4 - 62.1 (mg/dL)  IRON AND TIBC   Collection Time   04/10/12  5:46 PM      Component Value Range   Iron 46  42 - 145 (ug/dL)   UIBC 308  657 - 846 (ug/dL)   TIBC 962  952 - 841 (ug/dL)   %SAT 11 (*) 20 - 55 (%)  CBC WITH DIFFERENTIAL   Collection Time   04/10/12  5:46 PM      Component Value Range   WBC 6.1  4.0 - 10.5 (K/uL)   RBC 4.04  3.87 - 5.11 (MIL/uL)   Hemoglobin 11.5 (*) 12.0 - 15.0 (g/dL)   HCT 32.4  40.1 - 02.7 (%)   MCV 92.6  78.0 - 100.0 (fL)   MCH 28.5  26.0 - 34.0 (pg)  MCHC 30.7  30.0 - 36.0 (g/dL)   RDW 78.2 (*) 95.6 - 15.5 (%)   Platelets 164  150 - 400 (K/uL)   Neutrophils Relative 79 (*) 43 - 77 (%)   Neutro Abs 4.8  1.7 - 7.7 (K/uL)   Lymphocytes Relative 16  12 - 46 (%)   Lymphs Abs 1.0  0.7 - 4.0 (K/uL)   Monocytes Relative 4  3 - 12 (%)   Monocytes  Absolute 0.3  0.1 - 1.0 (K/uL)   Eosinophils Relative 1  0 - 5 (%)   Eosinophils Absolute 0.0  0.0 - 0.7 (K/uL)   Basophils Relative 0  0 - 1 (%)   Basophils Absolute 0.0  0.0 - 0.1 (K/uL)   Smear Review Criteria for review not met    GLUCOSE, POCT (MANUAL RESULT ENTRY)   Collection Time   04/11/12  9:03 AM      Component Value Range   POC Glucose 226    GLUCOSE, POCT (MANUAL RESULT ENTRY)   Collection Time   04/24/12  3:38 PM      Component Value Range   POC Glucose 163       Assessment and Plan:   ASSESSMENT:  1. Insulin-requiring T2DM:  The patient's BGs are gradually responding to her increasing doses of Lantus and her other meds.  Her subjective improvement in symptoms has improved in parallel. 2. Hypoglycemia/neuroglycopenia: The patient has not had any true hypoglycemia. Her neuroglycopenia symptoms have essentially resolved.   3. Infected leg ulcer: Resolved.  4. Leg edema: Edema is much improved. However, she still has a somewhat low albumin and somewhat low hemoglobin. Improvement in either or both of these proteins would result in higher oncotic pressures. 5. Hypertension: Better 6. Abdominal pains: Her GYN believes that the lower quadrant pains may be due to the cervical polyps.  7. Iron deficiency: Dr. Assunta Found put her on iron. We'll see how she does.   PLAN:  1. Diagnostic: Continue to check BGs twice a day, sometimes in morning, at lunchtime, at supper time, and at bedtime.  2. Therapeutic:   A. Watch salt intake tightly.   B. Gradually increase the Lantus dose by one unit every 4 days until most AM BGs are in the 80-120 range, unless she has more BGs <80 during the days.   C. On May 5th, reduce Lantus dose by 50%. On May 6th, hold oral DM meds that AM. After surgery, if she is eating normally that  evening, increase Lantus back to the usual full dose. If not eating well, however, increase Lantus to the 75% amount.  Increase Lantus to the full dose at bedtime once she  is eating normally. 3. Patient education: Until she can lose significant amounts of weight, she will need to continue the insulin and oral agent therapies.  4. Follow-up: 4 weeks  Level of Service: This visit lasted in excess of 40 minutes. More than 50% of the visit was devoted to counseling.  David Stall, MD 04/24/2012 4:26 PM

## 2012-04-28 ENCOUNTER — Encounter: Payer: Self-pay | Admitting: Obstetrics and Gynecology

## 2012-04-28 ENCOUNTER — Ambulatory Visit (INDEPENDENT_AMBULATORY_CARE_PROVIDER_SITE_OTHER): Payer: Medicare Other | Admitting: Obstetrics and Gynecology

## 2012-04-28 VITALS — BP 120/72 | HR 72 | Temp 98.7°F | Resp 16 | Ht 67.0 in | Wt 338.0 lb

## 2012-04-28 DIAGNOSIS — I428 Other cardiomyopathies: Secondary | ICD-10-CM

## 2012-04-28 DIAGNOSIS — Z01818 Encounter for other preprocedural examination: Secondary | ICD-10-CM

## 2012-04-28 DIAGNOSIS — N950 Postmenopausal bleeding: Secondary | ICD-10-CM

## 2012-04-28 DIAGNOSIS — N840 Polyp of corpus uteri: Secondary | ICD-10-CM

## 2012-04-28 NOTE — Progress Notes (Signed)
[name] is a 61 y.o. female G1P0010 who presents for hysteroscopy with dilatation/curettage and possible resection of an endometrial mass due to post menopausal bleeding and an endometrial mass.  Patient has been menopausal since her early 50's began heavy bleeding, described as "gushes" on March 08, 2012.  This continues x 1 week with clots and caused her to not be able to leave her home.  This bleeding stopped spontaneously but resumed again several days later accompanied by pelvic cramping prior to the passage of clots.   A pelvic ultrasound 04/07/12 showed an anteverted uterus 6.39 x 2.64 x 2.72 cm  with an endometrial thickness of 0.53 cm. Both ovaries appeared normal. Patient denies any urinary tract or bowel symptoms, vaginitis symptoms, fever, weakness or dizziness.  A discussion was held with the patient regarding the possible implications of post menopausal bleeding along with the recommended evaluation and treatment. Consequently patient has decided to proceed with further evaluation and management in the form of hysteroscopy with D & C.   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, tobacco or illicit drug use  Medications: Albuterol Inhaler prn Amiodarone 200 mg 1.5 tablets/d Rocaltrol 0.25mcg bid Calcium/Magnesium bid Digoxin 0.25 mg daily Cymbalta 30 mg bid Diabeta 5 mg 2 tablets bid Lorcet 10/650 1 every 8 hours prn Lantus Solostar 100 units/ml  18 units qhs Januvia 100 mg daily Synthroid 25 mcg (alternates, 62.5 mcg on odd days & 72 mcg on even days) daily Zaroxolyn 5 mg prn Lopressor 25mg bid Multivitamin daily Bactroban Ointment prn Nitroglyerin Patch every evening K-Dur 20 MEq bid Spiriva Inhalation Capsuld 18 mcg daily Demadex 100 mg daily Coumadin 5 mg-even days/ 2.5 mg-odd days Ambien 10 mg qhs prn  Allergies:Benadryl-swelling of hands/eyes;  Fish, Lohexol-wheezing, penicillin-respiratory arrest, povidone-wheezing, blisters and turns red, promethazine-lowers blood pressure, tape-blisters, Clindamycin-wheezing, Morphine Sulfate-rash, nausea and vomiting, contrast media, metformin  ROS:wears glasses, has chronic chest pain,has arthralgia and myalgias;  denies headache, vision changes, difficulty swallowing, tinnitus, urinary tract symptoms, bowel symptoms, vaginitis symptoms, skin rashes or unexplained weight loss.  Except as is mentioned in history of present illness, patient's review of systems is negative.  Physical Exam    BP 120/72  Pulse 72  Temp 98.7 F (37.1 C)  Resp 16  Wt 338 lb (153.316 kg) weight  BMI There is no height on file to calculate BMI.  Neck: supple without masses,  thyroid diffusely enlarged Lungs:soft breath sounds but no audible wheezes, rales or rhonchi Heart: regular rate and rhythm Abdomen:soft and non-tender Pelvic:Patient refused; exam from 03/27/12 showed normal appearing EGBUS and vagina, cervix had a visible polyp at the os, uterus appeared normal size and shape though exam was limited by  habitus Extremities:  no clubbing or cyanosis; mild   pedal edema  Assesment:Post menopausal bleeding   Disposition:  A discussion was held with patient regarding the indication for her procedure(s) along with the risks, which include but are not limited to: reaction to anesthesia, damage to adjacent organs, infection and excessive bleeding. Patient received cardiac clearance for surgery from cardiologist, Dr. Alfred Little 04/09/12. She has been instructed to discontinue her Coumadin, 4 days prior to surgery and resume it as soon as possible after her procedure.   Patient verbalized understanding of her risks and has consented to proceed with hysteroscopy, dilatation & curettage with possible resection of endometrial mass at [facility], May 05, 2012.   CSN# [id number]   Brycin Kille J. Burton Gahan, PA-C  for Dr. Angela Y. Roberts  

## 2012-04-29 ENCOUNTER — Encounter (HOSPITAL_COMMUNITY)
Admission: RE | Admit: 2012-04-29 | Discharge: 2012-04-29 | Disposition: A | Discharge status: Home / Self Care | Payer: Medicare Other | Source: Ambulatory Visit | Attending: Obstetrics and Gynecology | Admitting: Obstetrics and Gynecology

## 2012-04-29 ENCOUNTER — Encounter (HOSPITAL_COMMUNITY): Payer: Self-pay

## 2012-04-29 HISTORY — DX: Anemia, unspecified: D64.9

## 2012-04-29 HISTORY — DX: Encounter for other specified aftercare: Z51.89

## 2012-04-29 HISTORY — DX: Adverse effect of unspecified anesthetic, initial encounter: T41.45XA

## 2012-04-29 LAB — COMPREHENSIVE METABOLIC PANEL
Albumin: 3.4 g/dL — ABNORMAL LOW (ref 3.5–5.2)
Alkaline Phosphatase: 76 U/L (ref 39–117)
BUN: 42 mg/dL — ABNORMAL HIGH (ref 6–23)
Calcium: 9.5 mg/dL (ref 8.4–10.5)
Potassium: 3.5 mEq/L (ref 3.5–5.1)
Sodium: 136 mEq/L (ref 135–145)
Total Protein: 7.6 g/dL (ref 6.0–8.3)

## 2012-04-29 LAB — CBC
HCT: 37.2 % (ref 36.0–46.0)
MCHC: 32 g/dL (ref 30.0–36.0)
RDW: 15.5 % (ref 11.5–15.5)

## 2012-04-29 NOTE — Pre-Procedure Instructions (Signed)
I spoke with Dr. Malen Gauze while pt was here for PAT, to ask if PT/PTT needed to be done today while pt still taking Coumadin or do PT/PTT on day of surgery when off Coumadin. He wants PT/PTT done on DOS.   Dr. Malen Gauze aware of pt's cardiac clearance.

## 2012-04-29 NOTE — Progress Notes (Signed)
Addended by: Osborn Coho on: 04/29/2012 08:16 PM   Modules accepted: Orders

## 2012-04-29 NOTE — Patient Instructions (Addendum)
YOUR PROCEDURE IS SCHEDULED ON:05/05/12 ENTER THROUGH THE MAIN ENTRANCE OF [facility] [phone] am  USE DESK PHONE AND DIAL [phone] TO INFORM us OF YOUR ARRIVAL  CALL [phone] IF YOU HAVE ANY QUESTIONS OR PROBLEMS PRIOR TO YOUR ARRIVAL.  REMEMBER: DO NOT EAT OR DRINK AFTER MIDNIGHT :Sunday  SPECIAL INSTRUCTIONS:water ok until 7am Monday  YOU MAY BRUSH YOUR TEETH THE MORNING OF SURGERY   TAKE THESE MEDICINES THE DAY OF SURGERY WITH SIP OF WATER:take Lanoxin, Lopressor, Synthroid- bring inhaler to hospital   DO NOT WEAR JEWELRY, EYE MAKEUP, LIPSTICK OR DARK FINGERNAIL POLISH DO NOT WEAR LOTIONS  DO NOT SHAVE FOR 48 HOURS PRIOR TO SURGERY  YOU WILL NOT BE ALLOWED TO DRIVE YOURSELF HOME.  NAME OF DRIVER: sister- [name]

## 2012-05-01 ENCOUNTER — Encounter: Payer: Self-pay | Admitting: Obstetrics and Gynecology

## 2012-05-01 NOTE — Pre-Procedure Instructions (Signed)
Dr. Malen Gauze aware of abnormal labs results, including glucose result of 211.

## 2012-05-02 DIAGNOSIS — I428 Other cardiomyopathies: Secondary | ICD-10-CM | POA: Insufficient documentation

## 2012-05-02 NOTE — H&P (Signed)
[name] is a 62 y.o. female G1P0010 who presents for hysteroscopy with dilatation/curettage and possible resection of an endometrial mass due to post menopausal bleeding and an endometrial mass.  Patient has been menopausal since her early 50's began heavy bleeding, described as "gushes" on March 08, 2012.  This continues x 1 week with clots and caused her to not be able to leave her home.  This bleeding stopped spontaneously but resumed again several days later accompanied by pelvic cramping prior to the passage of clots.   A pelvic ultrasound 04/07/12 showed an anteverted uterus 6.39 x 2.64 x 2.72 cm  with an endometrial thickness of 0.53 cm. Both ovaries appeared normal. Patient denies any urinary tract or bowel symptoms, vaginitis symptoms, fever, weakness or dizziness.  A discussion was held with the patient regarding the possible implications of post menopausal bleeding along with the recommended evaluation and treatment. Consequently patient has decided to proceed with further evaluation and management in the form of hysteroscopy with D & C.   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, tobacco or illicit drug use  Medications: Albuterol Inhaler prn Amiodarone 200 mg 1.5 tablets/d Rocaltrol 0.25mcg bid Calcium/Magnesium bid Digoxin 0.25 mg daily Cymbalta 30 mg bid Diabeta 5 mg 2 tablets bid Lorcet 10/650 1 every 8 hours prn Lantus Solostar 100 units/ml  18 units qhs Januvia 100 mg daily Synthroid 25 mcg (alternates, 62.5 mcg on odd days & 72 mcg on even days) daily Zaroxolyn 5 mg prn Lopressor 25mg bid Multivitamin daily Bactroban Ointment prn Nitroglyerin Patch every evening K-Dur 20 MEq bid Spiriva Inhalation Capsuld 18 mcg daily Demadex 100 mg daily Coumadin 5 mg-even days/ 2.5 mg-odd days Ambien 10 mg qhs prn  Allergies:Benadryl-swelling of hands/eyes;  Fish, Lohexol-wheezing, penicillin-respiratory arrest, povidone-wheezing, blisters and turns red, promethazine-lowers blood pressure, tape-blisters, Clindamycin-wheezing, Morphine Sulfate-rash, nausea and vomiting, contrast media, metformin  ROS:wears glasses, has chronic chest pain,has arthralgia and myalgias;  denies headache, vision changes, difficulty swallowing, tinnitus, urinary tract symptoms, bowel symptoms, vaginitis symptoms, skin rashes or unexplained weight loss.  Except as is mentioned in history of present illness, patient's review of systems is negative.  Physical Exam    BP 120/72  Pulse 72  Temp 98.7 F (37.1 C)  Resp 16  Wt 338 lb (153.316 kg) weight  BMI There is no height on file to calculate BMI.  Neck: supple without masses,  thyroid diffusely enlarged Lungs:soft breath sounds but no audible wheezes, rales or rhonchi Heart: regular rate and rhythm Abdomen:soft and non-tender Pelvic:Patient refused; exam from 03/27/12 showed normal appearing EGBUS and vagina, cervix had a visible polyp at the os, uterus appeared normal size and shape though exam was limited by  habitus Extremities:  no clubbing or cyanosis; mild   pedal edema  Assesment:Post menopausal bleeding   Disposition:  A discussion was held with patient regarding the indication for her procedure(s) along with the risks, which include but are not limited to: reaction to anesthesia, damage to adjacent organs, infection and excessive bleeding. Patient received cardiac clearance for surgery from cardiologist, Dr. Alfred Little 04/09/12. She has been instructed to discontinue her Coumadin, 4 days prior to surgery and resume it as soon as possible after her procedure.   Patient verbalized understanding of her risks and has consented to proceed with hysteroscopy, dilatation & curettage with possible resection of endometrial mass at [facility], May 05, 2012.   CSN# [id number]   Aydan Levitz J. Martiza Speth, PA-C  for Dr. Angela Y. Roberts  

## 2012-05-05 ENCOUNTER — Inpatient Hospital Stay (HOSPITAL_COMMUNITY): Payer: Medicare Other | Admitting: Anesthesiology

## 2012-05-05 ENCOUNTER — Encounter (HOSPITAL_COMMUNITY): Payer: Self-pay | Admitting: Anesthesiology

## 2012-05-05 ENCOUNTER — Encounter (HOSPITAL_COMMUNITY)
Admission: RE | Disposition: A | Discharge status: Home / Self Care | Payer: Self-pay | Source: Ambulatory Visit | Attending: Obstetrics and Gynecology

## 2012-05-05 ENCOUNTER — Encounter (HOSPITAL_COMMUNITY): Payer: Self-pay | Admitting: *Deleted

## 2012-05-05 ENCOUNTER — Ambulatory Visit (HOSPITAL_COMMUNITY)
Admission: RE | Admit: 2012-05-05 | Discharge: 2012-05-05 | Disposition: A | Discharge status: Home / Self Care | Payer: Medicare Other | Source: Ambulatory Visit | Attending: Obstetrics and Gynecology | Admitting: Obstetrics and Gynecology

## 2012-05-05 DIAGNOSIS — N950 Postmenopausal bleeding: Secondary | ICD-10-CM

## 2012-05-05 DIAGNOSIS — Z01812 Encounter for preprocedural laboratory examination: Secondary | ICD-10-CM | POA: Insufficient documentation

## 2012-05-05 DIAGNOSIS — Z01818 Encounter for other preprocedural examination: Secondary | ICD-10-CM | POA: Insufficient documentation

## 2012-05-05 DIAGNOSIS — N8502 Endometrial intraepithelial neoplasia [EIN]: Secondary | ICD-10-CM

## 2012-05-05 DIAGNOSIS — N840 Polyp of corpus uteri: Secondary | ICD-10-CM

## 2012-05-05 DIAGNOSIS — N841 Polyp of cervix uteri: Secondary | ICD-10-CM | POA: Insufficient documentation

## 2012-05-05 HISTORY — PX: OTHER SURGICAL HISTORY: SHX169

## 2012-05-05 HISTORY — DX: Endometrial intraepithelial neoplasia (EIN): N85.02

## 2012-05-05 LAB — BASIC METABOLIC PANEL
CO2: 31 mEq/L (ref 19–32)
Calcium: 9.8 mg/dL (ref 8.4–10.5)
Creatinine, Ser: 1.41 mg/dL — ABNORMAL HIGH (ref 0.50–1.10)
Glucose, Bld: 173 mg/dL — ABNORMAL HIGH (ref 70–99)
Sodium: 138 mEq/L (ref 135–145)

## 2012-05-05 LAB — PROTIME-INR: Prothrombin Time: 18.6 seconds — ABNORMAL HIGH (ref 11.6–15.2)

## 2012-05-05 SURGERY — DILATATION & CURETTAGE/HYSTEROSCOPY WITH RESECTOCOPE
Anesthesia: General | Wound class: Clean Contaminated

## 2012-05-05 MED ORDER — MIDAZOLAM HCL 2 MG/2ML IJ SOLN
INTRAMUSCULAR | Status: AC
  Filled 2012-05-05: qty 2

## 2012-05-05 MED ORDER — PHENYLEPHRINE 40 MCG/ML (10ML) SYRINGE FOR IV PUSH (FOR BLOOD PRESSURE SUPPORT)
PREFILLED_SYRINGE | INTRAVENOUS | Status: AC
  Filled 2012-05-05: qty 5

## 2012-05-05 MED ORDER — FENTANYL CITRATE 0.05 MG/ML IJ SOLN
INTRAMUSCULAR | Status: AC
  Filled 2012-05-05: qty 2

## 2012-05-05 MED ORDER — PROPOFOL 10 MG/ML IV EMUL
INTRAVENOUS | Status: DC | PRN
  Administered 2012-05-05: 160 mg via INTRAVENOUS

## 2012-05-05 MED ORDER — HYDROCODONE-ACETAMINOPHEN 5-325 MG PO TABS
1.0000 | ORAL_TABLET | Freq: Once | ORAL | Status: AC
  Administered 2012-05-05: 1 via ORAL

## 2012-05-05 MED ORDER — LIDOCAINE HCL 1 % IJ SOLN
INTRAMUSCULAR | Status: DC | PRN
  Administered 2012-05-05: 10 mL

## 2012-05-05 MED ORDER — GLYCINE 1.5 % IR SOLN
Status: DC | PRN
  Administered 2012-05-05: 760 mL

## 2012-05-05 MED ORDER — FENTANYL CITRATE 0.05 MG/ML IJ SOLN
INTRAMUSCULAR | Status: DC | PRN
  Administered 2012-05-05: 50 ug via INTRAVENOUS

## 2012-05-05 MED ORDER — LIDOCAINE HCL (CARDIAC) 20 MG/ML IV SOLN
INTRAVENOUS | Status: DC | PRN
  Administered 2012-05-05: 50 mg via INTRAVENOUS

## 2012-05-05 MED ORDER — HYDROCODONE-ACETAMINOPHEN 5-325 MG PO TABS
ORAL_TABLET | ORAL | Status: AC
  Administered 2012-05-05: 1 via ORAL
  Filled 2012-05-05: qty 1

## 2012-05-05 MED ORDER — LACTATED RINGERS IV SOLN
INTRAVENOUS | Status: DC
  Administered 2012-05-05: 11:00:00 via INTRAVENOUS

## 2012-05-05 MED ORDER — LIDOCAINE HCL (CARDIAC) 20 MG/ML IV SOLN
INTRAVENOUS | Status: AC
  Filled 2012-05-05: qty 5

## 2012-05-05 MED ORDER — HYDROCODONE-ACETAMINOPHEN 5-500 MG PO TABS: 1.0000 | ORAL_TABLET | Freq: Four times a day (QID) | ORAL | Status: AC | PRN

## 2012-05-05 MED ORDER — PROPOFOL 10 MG/ML IV EMUL
INTRAVENOUS | Status: AC
  Filled 2012-05-05: qty 20

## 2012-05-05 MED ORDER — ONDANSETRON HCL 4 MG/2ML IJ SOLN
INTRAMUSCULAR | Status: DC | PRN
  Administered 2012-05-05: 4 mg via INTRAVENOUS

## 2012-05-05 MED ORDER — ONDANSETRON HCL 4 MG/2ML IJ SOLN
INTRAMUSCULAR | Status: AC
  Filled 2012-05-05: qty 2

## 2012-05-05 MED ORDER — PHENYLEPHRINE HCL 10 MG/ML IJ SOLN
INTRAMUSCULAR | Status: DC | PRN
Start: 2012-05-05 — End: 2012-05-05
  Administered 2012-05-05 (×2): 40 ug via INTRAVENOUS
  Administered 2012-05-05 (×2): 80 ug via INTRAVENOUS
  Administered 2012-05-05: 40 ug via INTRAVENOUS

## 2012-05-05 SURGICAL SUPPLY — 17 items
CANISTER SUCTION 2500CC (MISCELLANEOUS) ×2 IMPLANT
CATH ROBINSON RED A/P 16FR (CATHETERS) ×2 IMPLANT
CLOTH BEACON ORANGE TIMEOUT ST (SAFETY) ×2 IMPLANT
CONTAINER PREFILL 10% NBF 60ML (FORM) ×4 IMPLANT
ELECT REM PT RETURN 9FT ADLT (ELECTROSURGICAL) ×2
ELECTRODE REM PT RTRN 9FT ADLT (ELECTROSURGICAL) ×1 IMPLANT
GLOVE BIO SURGEON STRL SZ7.5 (GLOVE) ×4 IMPLANT
GLOVE BIOGEL PI IND STRL 7.5 (GLOVE) ×1 IMPLANT
GLOVE BIOGEL PI INDICATOR 7.5 (GLOVE) ×1
GLOVE INDICATOR 6.5 STRL GRN (GLOVE) ×1 IMPLANT
GLOVE SURG SS PI 6.5 STRL IVOR (GLOVE) ×1 IMPLANT
GOWN PREVENTION PLUS LG XLONG (DISPOSABLE) ×2 IMPLANT
GOWN STRL REIN XL XLG (GOWN DISPOSABLE) ×2 IMPLANT
LOOP ANGLED CUTTING 22FR (CUTTING LOOP) IMPLANT
PACK HYSTEROSCOPY LF (CUSTOM PROCEDURE TRAY) ×2 IMPLANT
TOWEL OR 17X24 6PK STRL BLUE (TOWEL DISPOSABLE) ×4 IMPLANT
WATER STERILE IRR 1000ML POUR (IV SOLUTION) ×2 IMPLANT

## 2012-05-05 NOTE — OR Nursing (Signed)
[id number] Dr. Su Hilt did a pap smear prior to the prep.

## 2012-05-05 NOTE — Interval H&P Note (Signed)
History and Physical Interval Note:  05/05/2012 11:15 AM  [name]  has presented today for surgery, with the diagnosis of Post Menopausal Bleeding Polyp,Morbidly Obese  The various methods of treatment have been discussed with the patient and family. After consideration of risks, benefits and other options for treatment, the patient has consented to  Procedure(s) (LRB): DILATATION & CURETTAGE/HYSTEROSCOPY WITH RESECTOCOPE  and pap smear (N/A) as a surgical intervention .  The patients' history has been reviewed, patient examined, no change in status, stable for surgery.  I have reviewed the patients' chart and labs.  Questions were answered to the patient's satisfaction.     Purcell Nails

## 2012-05-05 NOTE — Op Note (Signed)
Preop Diagnosis: Post Menopausal Bleeding Polyp,Morbidly Obese   Postop Diagnosis: Post Menopausal Bleeding Polyp,Morbidly Obese   Procedure: 1. DILATATION AND CURETTAGE /HYSTEROSCOPY CERVICAL POLYPECTOMY 1.Pap Smear  Anesthesia: General via LMA  Anesthesiologist: Dr. Dana Allan   Attending: Purcell Nails, MD   Assistant: N/a  Findings: Cervical polyps and endometrial polyps  Pathology: 1.cervical polyps 2.endometrial polyps and endometrial curettings  Fluids: 700cc  UOP: QS about 100cc via straight cath prior to procedure  EBL: Minimal  Complications: None  Procedure: The patient was taken to the operating room after the risks, benefits and alternatives were discussed with the patient. The patient verbalized understanding and consent signed and witnessed. The patient was placed under general anesthesia with an LMA per anesthesiologist and prepped and draped in the normal sterile fashion.  Time Out was performed per protocol.  A bivalve speculum was placed in the patient's vagina and the anterior lip of the cervix was grasped with a single tooth tenaculum. A paracervical block was administered using a total of 10 cc of 1% lidocaine. The uterus sounded to 8 cm. The cervix was dilated for passage of the hysteroscope.  The hysteroscope was introduced into the uterine cavity and findings as noted above. Sharp curettage was performed until a gritty texture was noted and curettings were sent to pathology. The hysteroscope was reintroduced and no obvious remaining intracavitary lesions were noted.  All instruments were removed. Sponge lap and needle count was correct. The patient tolerated the procedure well and was returned to the recovery room in good condition.

## 2012-05-05 NOTE — Transfer of Care (Signed)
Immediate Anesthesia Transfer of Care Note  Patient: [name]  Procedure(s) Performed: Procedure(s) (LRB): DILATATION & CURETTAGE/HYSTEROSCOPY WITH RESECTOCOPE (N/A)  Patient Location: PACU  Anesthesia Type: General  Level of Consciousness: sedated  Airway & Oxygen Therapy: Patient Spontanous Breathing and Patient connected to nasal cannula oxygen  Post-op Assessment: Report given to PACU RN  Post vital signs: Reviewed and stable  Complications: No apparent anesthesia complications

## 2012-05-05 NOTE — Anesthesia Postprocedure Evaluation (Signed)
Anesthesia Post Note  Patient: [name]  Procedure(s) Performed: Procedure(s) (LRB): DILATATION & CURETTAGE/HYSTEROSCOPY WITH RESECTOCOPE (N/A)  Anesthesia type: General  Patient location: PACU  Post pain: Pain level controlled  Post assessment: Post-op Vital signs reviewed  Last Vitals:  Filed Vitals:   05/05/12 1320  BP:   Pulse: 69  Temp: 36.3 C  Resp: 18    Post vital signs: Reviewed  Level of consciousness: sedated  Complications: No apparent anesthesia complications

## 2012-05-05 NOTE — Anesthesia Preprocedure Evaluation (Signed)
Anesthesia Evaluation  Patient identified by MRN, date of birth, ID band Patient awake    Reviewed: Allergy & Precautions, H&P , NPO status , Patient's Chart, lab work & pertinent test results, reviewed documented beta blocker date and time   History of Anesthesia Complications (+) PROLONGED EMERGENCE  Airway Mallampati: II TM Distance: >3 FB Neck ROM: full    Dental  (+) Teeth Intact   Pulmonary shortness of breath (hasn't used inhalers in a while), sleep apnea, Continuous Positive Airway Pressure Ventilation and Oxygen sleep apnea ,  breath sounds clear to auscultation  Pulmonary exam normal       Cardiovascular + angina (chronic chest pain, multiple caths - most recently 2012 showed no CAD, EF 40%, elevated PA pressures of 50/23) + dysrhythmias (on coumadin, metoprolol, amiodarone and digoxin) Atrial Fibrillation + pacemaker (sick sinus syndrome, paced 100% of the time) Rhythm:regular Rate:Normal  Cardiomyopathy, pulmonary HTN   Neuro/Psych  Neuromuscular disease (fibromyalgia) negative psych ROS   GI/Hepatic negative GI ROS, Neg liver ROS,   Endo/Other  Diabetes mellitus-, Type 2, Oral Hypoglycemic Agents and Insulin DependentHypothyroidism (hashimotos thyroiditis, goiter) Morbid obesityhyperparathyroidism  Renal/GU CRFRenal disease (s/p nephrectomy after untreated pyelonephritis.  Cr 1.42)  Female GU complaint     Musculoskeletal   Abdominal   Peds  Hematology negative hematology ROS (+)   Anesthesia Other Findings Patient refuses spinal anesthesia SEE EXTENSIVE ALLERGY LIST  Reproductive/Obstetrics negative OB ROS                           Anesthesia Physical Anesthesia Plan  ASA: III  Anesthesia Plan: General LMA   Post-op Pain Management:    Induction:   Airway Management Planned:   Additional Equipment:   Intra-op Plan:   Post-operative Plan:   Informed Consent: I have  reviewed the patients History and Physical, chart, labs and discussed the procedure including the risks, benefits and alternatives for the proposed anesthesia with the patient or authorized representative who has indicated his/her understanding and acceptance.   Dental Advisory Given  Plan Discussed with: CRNA and Surgeon  Anesthesia Plan Comments:         Anesthesia Quick Evaluation

## 2012-05-05 NOTE — Discharge Instructions (Signed)
DISCHARGE INSTRUCTIONS: D&C / D&E The following instructions have been prepared to help you care for yourself upon your return home.   Personal hygiene: Marland Kitchen Use sanitary pads for vaginal drainage, not tampons. . Shower the day after your procedure. . NO tub baths, pools or Jacuzzis for 2-3 weeks. . Wipe front to back after using the bathroom.  Activity and limitations: . Do NOT drive or operate any equipment for 24 hours. The effects of anesthesia are still present and drowsiness may result. . Do NOT rest in bed all day. . Walking is encouraged. . Walk up and down stairs slowly. . You may resume your normal activity in one to two days or as indicated by your physician.  Sexual activity: NO intercourse for at least 2 weeks after the procedure, or as indicated by your physician.  Diet: Eat a light meal as desired this evening. You may resume your usual diet tomorrow.  Return to work: You may resume your work activities in one to two days or as indicated by your doctor.  What to expect after your surgery: Expect to have vaginal bleeding/discharge for 2-3 days and spotting for up to 10 days. It is not unusual to have soreness for up to 1-2 weeks. You may have a slight burning sensation when you urinate for the first day. Mild cramps may continue for a couple of days. You may have a regular period in 2-6 weeks.  Call your doctor for any of the following: . Excessive vaginal bleeding, saturating and changing one pad every hour. . Inability to urinate 6 hours after discharge from hospital. . Pain not relieved by pain medication. . Fever of 100.4 F or greater. . Unusual vaginal discharge or odor.  Return to office ________________ Call for an appointment ___________________  Patient's signature: ______________________  Nurse's signature ________________________  Post Anesthesia Care Unit [phone]  DISCHARGE INSTRUCTIONS: D&C / D&E The following instructions have been prepared to  help you care for yourself upon your return home.   Personal hygiene: Marland Kitchen Use sanitary pads for vaginal drainage, not tampons. . Shower the day after your procedure. . NO tub baths, pools or Jacuzzis for 2-3 weeks. . Wipe front to back after using the bathroom.  Activity and limitations: . Do NOT drive or operate any equipment for 24 hours. The effects of anesthesia are still present and drowsiness may result. . Do NOT rest in bed all day. . Walking is encouraged. . Walk up and down stairs slowly. . You may resume your normal activity in one to two days or as indicated by your physician.  Sexual activity: NO intercourse for at least 2 weeks after the procedure, or as indicated by your physician.  Diet: Eat a light meal as desired this evening. You may resume your usual diet tomorrow.  Return to work: You may resume your work activities in one to two days or as indicated by your doctor.  What to expect after your surgery: Expect to have vaginal bleeding/discharge for 2-3 days and spotting for up to 10 days. It is not unusual to have soreness for up to 1-2 weeks. You may have a slight burning sensation when you urinate for the first day. Mild cramps may continue for a couple of days. You may have a regular period in 2-6 weeks.  Call your doctor for any of the following: . Excessive vaginal bleeding, saturating and changing one pad every hour. . Inability to urinate 6 hours after discharge from hospital. . Pain  not relieved by pain medication. . Fever of 100.4 F or greater. . Unusual vaginal discharge or odor.  Return to office ________________ Call for an appointment ___________________  Patient's signature: ______________________  Nurse's signature ________________________  Post Anesthesia Care Unit [phone]

## 2012-05-05 NOTE — H&P (View-Only) (Signed)
[name] is a 62 y.o. female G1P0010 who presents for hysteroscopy with dilatation/curettage and possible resection of an endometrial mass due to post menopausal bleeding and an endometrial mass.  Patient has been menopausal since her early 58's began heavy bleeding, described as "gushes" on March 08, 2012.  This continues x 1 week with clots and caused her to not be able to leave her home.  This bleeding stopped spontaneously but resumed again several days later accompanied by pelvic cramping prior to the passage of clots.   A pelvic ultrasound 04/07/12 showed an anteverted uterus 6.39 x 2.64 x 2.72 cm  with an endometrial thickness of 0.53 cm. Both ovaries appeared normal. Patient denies any urinary tract or bowel symptoms, vaginitis symptoms, fever, weakness or dizziness.  A discussion was held with the patient regarding the possible implications of post menopausal bleeding along with the recommended evaluation and treatment. Consequently patient has decided to proceed with further evaluation and management in the form of hysteroscopy with D & C.   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 Blood transfusions 1977 with ruptured ectopic  Family  History: Diabetes, cardiovascular disease, cancer  Social History:  married, retired Engineer, civil (consulting); denies alcohol, tobacco or illicit drug use  Medications: Albuterol Inhaler prn Amiodarone 200 mg 1.5 tablets/d Rocaltrol 0.42mcg bid Calcium/Magnesium bid Digoxin 0.25 mg daily Cymbalta 30 mg bid Diabeta 5 mg 2 tablets bid Lorcet 10/650 1 every 8 hours prn Lantus Solostar 100 units/ml  18 units qhs Januvia 100 mg daily Synthroid 25 mcg (alternates, 62.5 mcg on odd days & 72 mcg on even days) daily Zaroxolyn 5 mg prn Lopressor 25mg  bid Multivitamin daily Bactroban Ointment prn Nitroglyerin Patch every evening K-Dur 20 MEq bid Spiriva Inhalation Capsuld 18 mcg daily Demadex 100 mg daily Coumadin 5 mg-even days/ 2.5 mg-odd days Ambien 10 mg qhs prn  Allergies:Benadryl-swelling of hands/eyes;  Fish, Lohexol-wheezing, penicillin-respiratory arrest, povidone-wheezing, blisters and turns red, promethazine-lowers blood pressure, tape-blisters, Clindamycin-wheezing, Morphine Sulfate-rash, nausea and vomiting, contrast media, metformin  GEX:BMWUX glasses, has chronic chest pain,has arthralgia and myalgias;  denies headache, vision changes, difficulty swallowing, tinnitus, urinary tract symptoms, bowel symptoms, vaginitis symptoms, skin rashes or unexplained weight loss.  Except as is mentioned in history of present illness, patient's review of systems is negative.  Physical Exam    BP 120/72  Pulse 72  Temp 98.7 F (37.1 C)  Resp 16  Wt 338 lb (153.316 kg) weight  BMI There is no height on file to calculate BMI.  Neck: supple without masses,  thyroid diffusely enlarged Lungs:soft breath sounds but no audible wheezes, rales or rhonchi Heart: regular rate and rhythm Abdomen:soft and non-tender Pelvic:Patient refused; exam from 03/27/12 showed normal appearing EGBUS and vagina, cervix had a visible polyp at the os, uterus appeared normal size and shape though exam was limited by  habitus Extremities:  no clubbing or cyanosis; mild  pedal edema  Assesment:Post menopausal bleeding   Disposition:  A discussion was held with patient regarding the indication for her procedure(s) along with the risks, which include but are not limited to: reaction to anesthesia, damage to adjacent organs, infection and excessive bleeding. Patient received cardiac clearance for surgery from cardiologist, Dr. Julieanne Manson 04/09/12. She has been instructed to discontinue her Coumadin, 4 days prior to surgery and resume it as soon as possible after her procedure.   Patient verbalized understanding of her risks and has consented to proceed with hysteroscopy, dilatation & curettage with possible resection of endometrial mass at [facility], May 05, 2012.   CSN# [id number]   Margery Szostak J. Lowell Guitar, PA-C  for Dr. Woodroe Mode. Su Hilt

## 2012-05-06 NOTE — Progress Notes (Signed)
Quick Note:  Please contact the patient and inform her that there were no obvious signs of cancer in her specimen. However, there were high grade atypical cells in the endometrial polyps called complex hyperplasia with atypia. The cervical polyps were benign. I recommend referral to gyn oncology for their further recommendations. I will review further and plan to answer any questions at her postop appt. If she requests to come in sooner, please r/s her. Thank you ______

## 2012-05-13 ENCOUNTER — Encounter: Payer: Self-pay | Admitting: Obstetrics and Gynecology

## 2012-05-14 ENCOUNTER — Telehealth: Payer: Self-pay

## 2012-05-14 ENCOUNTER — Other Ambulatory Visit: Payer: Self-pay

## 2012-05-14 DIAGNOSIS — N8502 Endometrial intraepithelial neoplasia [EIN]: Secondary | ICD-10-CM

## 2012-05-14 NOTE — Telephone Encounter (Signed)
Spoke to pt again to let her know of appt. W/ Dr. Stanford Breed on Mon the 20th @ 1:30. [name] A

## 2012-05-14 NOTE — Telephone Encounter (Signed)
Spoke to pt to inform of pathology results. Dr. Su Hilt is recommending referral to GYN /ONC to review path and give rec's. I will generate referral to Dr. Emmaline Kluver or other available GYN/ONC. Pt is agreeable and she has POP appt w/ [facility] on the 22nd of May. [name] A

## 2012-05-19 ENCOUNTER — Ambulatory Visit: Payer: Medicare Other | Attending: Gynecology | Admitting: Gynecology

## 2012-05-19 ENCOUNTER — Encounter: Payer: Self-pay | Admitting: Gynecology

## 2012-05-19 VITALS — BP 120/80 | HR 72 | Temp 97.5°F | Resp 24 | Ht 67.0 in | Wt 337.4 lb

## 2012-05-19 DIAGNOSIS — G4733 Obstructive sleep apnea (adult) (pediatric): Secondary | ICD-10-CM | POA: Insufficient documentation

## 2012-05-19 DIAGNOSIS — Z950 Presence of cardiac pacemaker: Secondary | ICD-10-CM | POA: Insufficient documentation

## 2012-05-19 DIAGNOSIS — E039 Hypothyroidism, unspecified: Secondary | ICD-10-CM | POA: Insufficient documentation

## 2012-05-19 DIAGNOSIS — N8502 Endometrial intraepithelial neoplasia [EIN]: Secondary | ICD-10-CM | POA: Insufficient documentation

## 2012-05-19 DIAGNOSIS — E785 Hyperlipidemia, unspecified: Secondary | ICD-10-CM | POA: Insufficient documentation

## 2012-05-19 DIAGNOSIS — D649 Anemia, unspecified: Secondary | ICD-10-CM | POA: Insufficient documentation

## 2012-05-19 DIAGNOSIS — Z79899 Other long term (current) drug therapy: Secondary | ICD-10-CM | POA: Insufficient documentation

## 2012-05-19 DIAGNOSIS — E119 Type 2 diabetes mellitus without complications: Secondary | ICD-10-CM | POA: Insufficient documentation

## 2012-05-19 DIAGNOSIS — N8501 Benign endometrial hyperplasia: Secondary | ICD-10-CM

## 2012-05-19 NOTE — Progress Notes (Signed)
Consult Note: Gyn-Onc   [name] 62 y.o. female  Chief Complaint  Patient presents with  . Complex Hyperplasia    New pt       HPI: 62 year old white female seen in consultation request of Dr. Osborn Coho. The patient developed postmenopausal bleeding and underwent a D&C on 05/05/2012. Final pathology showed simple and complex hyperplasia with atypia and the following comment:There are focal areas of markedly atypical glandular cells arranged in cribriform pattern; the histologic features are worrisome for well differentiated endometrioid adenocarcinoma.  Since the D&C the patient's head only some spotting. She denies any pelvic pain pressure or any GI or GU symptoms.  Review of Systems:10 point review of systems is negative as noted above.   Vitals: Blood pressure 120/80, pulse 72, temperature 97.5 F (36.4 C), temperature source Oral, resp. rate 24, height 5\' 7"  (1.702 m), weight 337 lb 6.4 oz (153.044 kg).  Physical Exam: General : The patient is a morbidly obese woman in no acute distress.  HEENT: normocephalic, extraoccular movements normal; neck is supple without thyromegally  Lynphnodes: Supraclavicular and inguinal nodes not enlarged  Abdomen: Obese Soft, non-tender, no ascites, no organomegally, no masses, no hernias  Pelvic:  EGBUS: Normal female  Vagina: Normal, no lesions  Urethra and Bladder: Normal, non-tender  Cervix:  Normal Uterus:  Unable outline the uterus due to the patient's habitus Bi-manual examination: Non-tender; no adenxal masses or nodularity  Rectal: normal sphincter tone, no masses, no blood  Lower extremities: Obese, No edema or varicosities. 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 Patient has multiple medical comorbidities hysterectomy a high risk procedure.  I recommend the patient had a Mirena IUD placed and have a repeat  ultrasound assessing endometrial thickness and approximately 3 months. I've contacted Dr. Su Hilt and she will place the IUD on next office visit.  Allergies  Allergen Reactions  . Ivp Dye (Iodinated Diagnostic Agents) Shortness Of Breath    Turn red, can't breathe  . Diphenhydramine Hcl Swelling    In hands and eyes  . Fish Allergy   . Iohexol      Desc: PT TURNS RED AND WHEEZING   . Penicillins Other (See Comments)    Resp arrest as child  . Povidone Other (See Comments)    Wheezing, turn red, can't breath, and blisters  . Promethazine Hcl Other (See Comments)    Low Blood Pressure  . Tape     Blisters, can use paper tape for short periods  . Clindamycin Rash    wheezing  . Morphine Sulfate Nausea And Vomiting and Rash    Past Medical History  Diagnosis Date  . Personal history of other diseases of circulatory system   . Cardiac pacemaker in situ   . Chronic lymphocytic thyroiditis   . Morbid obesity   . Other chronic pulmonary heart diseases   . Obstructive sleep apnea (adult) (pediatric)   . Other and unspecified hyperlipidemia   . Fibromyalgia   . Goiter   . Thyroiditis, autoimmune   . Hypothyroidism, acquired, autoimmune   . Solitary kidney, acquired   . Fatigue   . Hyperparathyroidism, secondary   . Vitamin D deficiency disease   . Hyperparathyroidism   . H/O varicose veins 03/27/12  . Type II or unspecified type diabetes mellitus without mention of complication, not stated as uncontrolled   . Diabetes mellitus type II   . Infections of kidney 03/27/12  .  History of measles, mumps, or rubella 03/27/12  . Tubal pregnancy 12/23/76  . Unspecified essential hypertension     20 yrs ago  . Blood transfusion 1977    [facility]  . Complication of anesthesia 2010    hard to wake up after knee surgery  . Anemia   . Cardiomyopathy, nonischemic     Past Surgical History  Procedure Date  . Cholecystectomy   . Pacemaker placement   . Knee surgery   . Resection duplicate  left kidney   . 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 Hernia repair     ruptured ;    Current Outpatient Prescriptions  Medication Sig Dispense Refill  . albuterol (PROAIR HFA) 108 (90 BASE) MCG/ACT inhaler Inhale 2 puffs into the lungs every 6 (six) hours as needed.        Marland Kitchen amiodarone (CORDARONE) 200 MG tablet Take 300 mg by mouth daily. Takes 1 and 1/2 tablet daily.      . calcitRIOL (ROCALTROL) 0.25 MCG capsule Take 1 capsule (0.25 mcg total) by mouth 2 (two) times daily.  60 capsule  6  . Calcium-Magnesium-Vitamin D (CITRACAL CALCIUM+D) 600-40-500 MG-MG-UNIT TB24 Take 1 tablet by mouth 2 (two) times daily.       . digoxin (LANOXIN) 0.25 MG tablet Take 250 mcg by mouth daily.        . DULoxetine (CYMBALTA) 30 MG capsule Take 30 mg by mouth daily. Occasionally takes twice daily if in pain from fibromyalgia.      Marland Kitchen ergocalciferol (VITAMIN D2) 50000 UNITS capsule Take 50,000 Units by mouth. Twice weekly-- Tuesday and Friday      . ferrous sulfate 325 (65 FE) MG tablet Take 325 mg by mouth 2 (two) times daily.      Marland Kitchen glyBURIDE (DIABETA) 5 MG tablet 2 tablets twice a day.  120 tablet  6  . HYDROcodone-acetaminophen (LORCET 10/650) 10-650 MG per tablet Take 1 tablet by mouth every 8 (eight) hours as needed for pain.  90 tablet  5  . insulin glargine (LANTUS SOLOSTAR) 100 UNIT/ML injection Inject 18 Units into the skin at bedtime. Increase dose by one unit every 4 days until most AM BGs are in the 80-120 range.  5 pen  6  . JANUVIA 100 MG tablet Take 1 tablet by mouth Daily.      . metolazone (ZAROXOLYN) 5 MG tablet Take 5 mg by mouth as needed. For edema      . metoprolol tartrate (LOPRESSOR) 25 MG tablet Take 25 mg by mouth 2 (two) times daily.        . Multiple Vitamin (MULTIVITAMIN) tablet Take 1 tablet by mouth daily.        . mupirocin ointment (BACTROBAN) 2 % Apply 1 application topically 2 (two) times daily. To leg blister       . nitroGLYCERIN (NITRO-DUR) 0.4 mg/hr  Patch on at night and off in am. 1 patch every other day.      . ONE TOUCH ULTRA TEST test strip       . potassium chloride SA (K-DUR,KLOR-CON) 20 MEQ tablet Take 20 mEq by mouth 2 (two) times daily.       Marland Kitchen tiotropium (SPIRIVA) 18 MCG inhalation capsule Place 18 mcg into inhaler and inhale daily.        Marland Kitchen torsemide (DEMADEX) 100 MG tablet Take 50 mg by mouth 2 (two) times daily.       Marland Kitchen warfarin (COUMADIN) 5 MG tablet  Take 5 mg by mouth daily. 5 mg on even days. 2.5 mg on odd days Pacemaker- complete heart block and AF      . zolpidem (AMBIEN) 10 MG tablet Take 10 mg by mouth at bedtime as needed. sleep      . levothyroxine (SYNTHROID, LEVOTHROID) 25 MCG tablet Take  62.5 mcg on odd days and 75 mcg on even days        History   Social History  . Marital Status: Married    Spouse Name: N/A    Number of Children: N/A  . Years of Education: N/A   Occupational History  . retired Scientist, physiological    Social History Main Topics  . Smoking status: Never Smoker   . Smokeless tobacco: Never Used  . Alcohol Use: No  . Drug Use: No  . Sexually Active: Yes -- Female partner(s)    Birth Control/ Protection: Post-menopausal   Other Topics Concern  . Not on file   Social History Narrative  . No narrative on file    Family History  Problem Relation Age of Onset  . Cardiomyopathy Brother   . Diabetes Mother   . Cancer Maternal Grandmother       Jeannette Corpus, MD 05/19/2012, 1:20 PM

## 2012-05-19 NOTE — Patient Instructions (Signed)
Contact Dr. Su Hilt office to schedule a [name] IUD placement. We will plan on having a repeat ultrasound to evaluate the endometrial thickness and approximately 3 months.

## 2012-05-21 ENCOUNTER — Ambulatory Visit (INDEPENDENT_AMBULATORY_CARE_PROVIDER_SITE_OTHER): Payer: Medicare Other | Admitting: Obstetrics and Gynecology

## 2012-05-21 ENCOUNTER — Encounter: Payer: Self-pay | Admitting: Obstetrics and Gynecology

## 2012-05-21 VITALS — BP 108/68 | Ht 67.0 in | Wt 327.0 lb

## 2012-05-21 DIAGNOSIS — N8501 Benign endometrial hyperplasia: Secondary | ICD-10-CM

## 2012-05-21 MED ORDER — LEVONORGESTREL 20 MCG/24HR IU IUD
INTRAUTERINE_SYSTEM | Freq: Once | INTRAUTERINE | Status: AC
  Administered 2012-05-22: 09:00:00 via INTRAUTERINE

## 2012-05-21 NOTE — Progress Notes (Signed)
IUD INSERTION NOTE  Lengthy discussion about the risks benefits and alternatives of the IUD placement.  Patient had many questions were answered.  Total visit took approximately one hour.  Consent signed after risks and benefits were reviewed including but not limited to bleeding, infection and risk of uterine perforation.  LMP: No LMP recorded. Patient is postmenopausal. UPT: n/a pt is Postmenopausal GC / Chlamydia: not done  Prepping with Betadine Tenaculum placed on anterior lip of cervix Uterus sounded at  8 cm Insertion of MIRENA IUD per protocol without any complications   POST-PROCEDURE:  Patient instructed to call with fever or excessive pain Patient instructed to check IUD strings after each menstrual cycle   Follow-up: 5 weeks   [name] A. CMA 05/21/2012 3:59 PM

## 2012-05-22 ENCOUNTER — Other Ambulatory Visit: Payer: Self-pay | Admitting: *Deleted

## 2012-05-22 MED ORDER — ERGOCALCIFEROL 1.25 MG (50000 UT) PO CAPS: ORAL_CAPSULE | ORAL | Status: DC

## 2012-05-29 ENCOUNTER — Encounter: Payer: Self-pay | Admitting: Internal Medicine

## 2012-05-29 ENCOUNTER — Ambulatory Visit (INDEPENDENT_AMBULATORY_CARE_PROVIDER_SITE_OTHER): Payer: Medicare Other | Admitting: Internal Medicine

## 2012-05-29 ENCOUNTER — Other Ambulatory Visit: Payer: Medicare Other | Admitting: Internal Medicine

## 2012-05-29 VITALS — BP 128/82 | HR 88 | Temp 97.7°F

## 2012-05-29 DIAGNOSIS — H05019 Cellulitis of unspecified orbit: Secondary | ICD-10-CM

## 2012-05-29 DIAGNOSIS — L0390 Cellulitis, unspecified: Secondary | ICD-10-CM

## 2012-05-29 DIAGNOSIS — D509 Iron deficiency anemia, unspecified: Secondary | ICD-10-CM

## 2012-05-29 DIAGNOSIS — H109 Unspecified conjunctivitis: Secondary | ICD-10-CM

## 2012-05-29 DIAGNOSIS — Z7901 Long term (current) use of anticoagulants: Secondary | ICD-10-CM

## 2012-05-29 LAB — IRON AND TIBC
%SAT: 10 % — ABNORMAL LOW (ref 20–55)
TIBC: 460 ug/dL (ref 250–470)
UIBC: 415 ug/dL — ABNORMAL HIGH (ref 125–400)

## 2012-05-29 LAB — CBC WITH DIFFERENTIAL/PLATELET
Hemoglobin: 11.2 g/dL — ABNORMAL LOW (ref 12.0–15.0)
Lymphocytes Relative: 23 % (ref 12–46)
Lymphs Abs: 1.1 10*3/uL (ref 0.7–4.0)
MCH: 27.9 pg (ref 26.0–34.0)
Monocytes Relative: 6 % (ref 3–12)
Neutro Abs: 3.3 10*3/uL (ref 1.7–7.7)
Neutrophils Relative %: 71 % (ref 43–77)
Platelets: 149 10*3/uL — ABNORMAL LOW (ref 150–400)
RBC: 4.02 MIL/uL (ref 3.87–5.11)
WBC: 4.7 10*3/uL (ref 4.0–10.5)

## 2012-05-29 LAB — PROTIME-INR: Prothrombin Time: 31.9 seconds — ABNORMAL HIGH (ref 11.6–15.2)

## 2012-05-29 NOTE — Patient Instructions (Signed)
Iron studies and CBC have been drawn. See Dr. Nile Riggs today regarding eye condition. Protime has been drawn and will be sent to Dr. Clarene Duke.

## 2012-05-29 NOTE — Progress Notes (Signed)
  Subjective:    Patient ID: [name], female    DOB: 1950/05/12, 62 y.o.   MRN: [id number]  HPI 62 year old white female came in today for lab work related to iron deficiency anemia secondary to menorrhagia. Patient now has an IUD in per Dr. Loree Fee. No further problems with menorrhagia. However she has a new problem today. Has conjunctivitis and apparent mild periorbital cellulitis. Says she had onset of these symptoms yesterday. Says Dr. Nile Riggs is her eye physician. She thought he was closed today. We called his office and he agrees to see her on urgent basis. She has no drainage from the eye but her upper eyelid is swollen. Doesn't have any visual disturbance at this point. No headache. No fever. Says she had a similar problem about a year ago treated with azithromycin ophthalmic drops by Dr. Nile Riggs.    Review of Systems     Objective:   Physical Exam has periorbital redness left eye. Extraocular movements are full. PERRLA. Swollen left upper eyelid. Has conjunctivitis left eye. No drainage from.        Assessment & Plan:  Periorbital cellulitis (mild) left eye  Conjunctivitis left eye  History of deficiency anemia secondary to menorrhagia  Plan: Iron and iron-binding capacity drawn today along with CBC. Patient to  see Dr. Nile Riggs immediately after leaving this office.  The patient's request protime was drawn today and will be sent to Dr. Clarene Duke.

## 2012-06-02 ENCOUNTER — Ambulatory Visit: Payer: Medicare Other | Admitting: Internal Medicine

## 2012-06-02 ENCOUNTER — Other Ambulatory Visit: Payer: Self-pay | Admitting: *Deleted

## 2012-06-02 DIAGNOSIS — IMO0001 Reserved for inherently not codable concepts without codable children: Secondary | ICD-10-CM

## 2012-06-02 MED ORDER — GLYBURIDE 5 MG PO TABS: ORAL_TABLET | ORAL | Status: DC

## 2012-06-03 ENCOUNTER — Other Ambulatory Visit: Payer: Self-pay | Admitting: *Deleted

## 2012-06-09 ENCOUNTER — Other Ambulatory Visit: Payer: Self-pay | Admitting: *Deleted

## 2012-06-09 MED ORDER — GLYBURIDE 5 MG PO TABS: ORAL_TABLET | ORAL | Status: DC

## 2012-06-12 ENCOUNTER — Other Ambulatory Visit: Payer: Self-pay

## 2012-06-12 MED ORDER — HYDROCODONE-ACETAMINOPHEN 10-650 MG PO TABS: 1.0000 | ORAL_TABLET | Freq: Three times a day (TID) | ORAL | Status: DC | PRN

## 2012-06-24 ENCOUNTER — Encounter: Payer: Self-pay | Admitting: "Endocrinology

## 2012-06-24 ENCOUNTER — Ambulatory Visit (INDEPENDENT_AMBULATORY_CARE_PROVIDER_SITE_OTHER): Payer: Medicare Other | Admitting: "Endocrinology

## 2012-06-24 VITALS — BP 126/87 | HR 83 | Wt 322.0 lb

## 2012-06-24 DIAGNOSIS — E1169 Type 2 diabetes mellitus with other specified complication: Secondary | ICD-10-CM

## 2012-06-24 DIAGNOSIS — I10 Essential (primary) hypertension: Secondary | ICD-10-CM

## 2012-06-24 DIAGNOSIS — R609 Edema, unspecified: Secondary | ICD-10-CM

## 2012-06-24 DIAGNOSIS — D509 Iron deficiency anemia, unspecified: Secondary | ICD-10-CM

## 2012-06-24 DIAGNOSIS — R600 Localized edema: Secondary | ICD-10-CM

## 2012-06-24 DIAGNOSIS — E611 Iron deficiency: Secondary | ICD-10-CM

## 2012-06-24 DIAGNOSIS — E11649 Type 2 diabetes mellitus with hypoglycemia without coma: Secondary | ICD-10-CM

## 2012-06-24 LAB — GLUCOSE, POCT (MANUAL RESULT ENTRY): POC Glucose: 145 mg/dl — AB (ref 70–99)

## 2012-06-24 NOTE — Patient Instructions (Signed)
Follow-up visit in 3 months. Call in one week with BG results. Bring in meter for download four weeks later.

## 2012-06-24 NOTE — Progress Notes (Signed)
Subjective:  Patient Name: [name] Date of Birth: 01/14/1950  MRN: [id number]  [name]  presents to the office today for follow-up of her insulin-requiring T2DM, leg edema, left shin ulcer, hypertension, and edema.  HISTORY OF PRESENT ILLNESS:   [name] is a 62 y.o. Caucasian woman. [name] was accompanied by her husband.  1.The patient was first referred to me on 10/30/05 by her primary care provider, Dr. Sharlet Salina, for evaluation and co-management of her diabetes and obesity. I have followed her for the above problems since then. Please see my clinic note from 06/06/11 for detailed description of the course of her type 2 diabetes mellitus, hypothyroidism secondary to Hashimoto's thyroiditis, and hyperparathyroidism secondary to deficiency of calcium intake, vitamin D intake, and renal conversion of 25-hydroxy vitamin D to 1, 25-dihydroxy vitamin D.  2. At her clinic visit on 03/27/12 she had a left shin ulcer that was open and draining. Her HbA1c from 03/24/12 was 8.0%. Her oral anti-hyperglycemic medications, Januvia, 100 mg/day, and glyburide, 10 mg twice a day, were not adequately controlling her BGs. I finally convinced her to add Lantus insulin, at a dose of 4 units each evening. I also coordinated with Dr. Lenord Fellers and started the patient on topical Bactroban and oral doxycycline. We have gradually but progressively increased her Lantus dose since then and her BGs have improved substantially.  3. The patient's last [facility] visit was on 04/24/12. In the interim, she had a D&C and polypectomy on 05/05/12. . Cancer cells were identified. She has seen Dr. De Blanch, a GYN oncologist. Ms. Lennartz reported that he felt that a hysterectomy might be too stressful for her. He put in an IUD to keep the uterine lining from being thickened due to estrogen produced by aromatization by adipose cells. She is now taking 30 units of Lantus each evening. She also takes glyburide, 10 mg  at breakfast and supper and Januvia, 100 each AM. She has had several episode of  developing sweating, jitteriness, and nausea about 3:00-3:30 AM. She has not following the HS snack plan. 4. Pertinent Review of Systems:  Constitutional: The patient generally feels better since her BGs have been decreasing. Her energy is better.  She sleeps well with her CPAP and oxygen at 2 liters. Eyes: Vision is not as good as it was. She needs to have well-lit environments. She had a "huge" sty of her left lower eyelid in July. She saw Dr. Nile Riggs. The sty eventually opened on its own and resolved. There are no other significant eye complaints. Neck: The patient has no complaints of anterior neck swelling, soreness, tenderness,  pressure, discomfort, or difficulty swallowing.  Heart: She has A-fib frequently. Her pacemaker is still working. She still has occasional chest pains.  Gastrointestinal: She has been having nausea frequently. If she does not eat on time the nausea is often severe. Bowel movents seem normal. She still has some RLQ pains occasionally.  Legs: Muscle mass and strength seem normal. There are no complaints of numbness, tingling, burning, or pain. Edema is decreased in both legs.  Feet: There are no obvious foot problems. There are no complaints of numbness, tingling, burning, or pain. She still has some foot swelling.  GYN: As above Hypoglycemia: possibly at 3:00 AM 5. BG printout: BGs are much better. Her mean BG is 158.5. Her BG range is 133-238.  PAST MEDICAL, FAMILY, AND SOCIAL HISTORY:  Past Medical History  Diagnosis Date  . Personal history of other diseases  of circulatory system   . Cardiac pacemaker in situ   . Chronic lymphocytic thyroiditis   . Morbid obesity   . Other chronic pulmonary heart diseases   . Obstructive sleep apnea (adult) (pediatric)   . Other and unspecified hyperlipidemia   . Fibromyalgia   . Goiter   . Thyroiditis, autoimmune   . Hypothyroidism, acquired,  autoimmune   . Solitary kidney, acquired   . Fatigue   . Hyperparathyroidism, secondary   . Vitamin D deficiency disease   . Hyperparathyroidism   . H/O varicose veins 03/27/12  . Type II or unspecified type diabetes mellitus without mention of complication, not stated as uncontrolled   . Diabetes mellitus type II   . Infections of kidney 03/27/12  . History of measles, mumps, or rubella 03/27/12  . Tubal pregnancy 12/23/76  . Unspecified essential hypertension     20 yrs ago  . Blood transfusion 1977    [facility]  . Complication of anesthesia 2010    hard to wake up after knee surgery  . Anemia   . Cardiomyopathy, nonischemic     Family History  Problem Relation Age of Onset  . Cardiomyopathy Brother   . Diabetes Mother   . Cancer Maternal Grandmother     Current outpatient prescriptions:albuterol (PROAIR HFA) 108 (90 BASE) MCG/ACT inhaler, Inhale 2 puffs into the lungs every 6 (six) hours as needed.  , Disp: , Rfl: ;  ALPRAZolam (XANAX) 0.5 MG tablet, , Disp: , Rfl: ;  amiodarone (CORDARONE) 200 MG tablet, Take 300 mg by mouth daily. Takes 1 and 1/2 tablet daily., Disp: , Rfl:  calcitRIOL (ROCALTROL) 0.25 MCG capsule, Take 1 capsule (0.25 mcg total) by mouth 2 (two) times daily., Disp: 60 capsule, Rfl: 6;  Calcium-Magnesium-Vitamin D (CITRACAL CALCIUM+D) 600-40-500 MG-MG-UNIT TB24, Take 1 tablet by mouth 2 (two) times daily. , Disp: , Rfl: ;  digoxin (LANOXIN) 0.25 MG tablet, Take 250 mcg by mouth daily.  , Disp: , Rfl:  DULoxetine (CYMBALTA) 30 MG capsule, Take 30 mg by mouth daily. Occasionally takes twice daily if in pain from fibromyalgia., Disp: , Rfl: ;  ergocalciferol (VITAMIN D2) 50000 UNITS capsule, Take 1 tablet (50,000 units) twice weekly-- Tuesday and Friday., Disp: 4 capsule, Rfl: 5;  ferrous sulfate 325 (65 FE) MG tablet, Take 325 mg by mouth 2 (two) times daily., Disp: , Rfl:  glyBURIDE (DIABETA) 5 MG tablet, 2 tablets twice a day., Disp: 120 tablet, Rfl: 6;   HYDROcodone-acetaminophen (LORCET 10/650) 10-650 MG per tablet, Take 1 tablet by mouth every 8 (eight) hours as needed for pain., Disp: 90 tablet, Rfl: 5;  insulin glargine (LANTUS) 100 UNIT/ML injection, Inject 30 Units into the skin at bedtime. Increase dose by one unit every 4 days until most AM BGs are in the 80-120 range., Disp: , Rfl:  JANUVIA 100 MG tablet, Take 1 tablet by mouth Daily., Disp: , Rfl: ;  levothyroxine (SYNTHROID, LEVOTHROID) 25 MCG tablet, Take  62.5 mcg on odd days and 75 mcg on even days, Disp: , Rfl: ;  metolazone (ZAROXOLYN) 5 MG tablet, Take 5 mg by mouth as needed. For edema, Disp: , Rfl: ;  metoprolol tartrate (LOPRESSOR) 25 MG tablet, Take 25 mg by mouth 2 (two) times daily.  , Disp: , Rfl:  Multiple Vitamin (MULTIVITAMIN) tablet, Take 1 tablet by mouth daily.  , Disp: , Rfl: ;  mupirocin ointment (BACTROBAN) 2 %, Apply 1 application topically 2 (two) times daily. To leg blister , Disp: ,  Rfl: ;  nitroGLYCERIN (NITRO-DUR) 0.4 mg/hr, Patch on at night and off in am. 1 patch every other day., Disp: , Rfl: ;  ONE TOUCH ULTRA TEST test strip, , Disp: , Rfl:  potassium chloride SA (K-DUR,KLOR-CON) 20 MEQ tablet, Take 20 mEq by mouth 2 (two) times daily. , Disp: , Rfl: ;  tiotropium (SPIRIVA) 18 MCG inhalation capsule, Place 18 mcg into inhaler and inhale daily.  , Disp: , Rfl: ;  torsemide (DEMADEX) 100 MG tablet, Take 50 mg by mouth 2 (two) times daily. , Disp: , Rfl:  warfarin (COUMADIN) 5 MG tablet, Take 5 mg by mouth daily. 5 mg on even days. 2.5 mg on odd days Pacemaker- complete heart block and AF, Disp: , Rfl: ;  zolpidem (AMBIEN) 10 MG tablet, Take 10 mg by mouth at bedtime as needed. sleep, Disp: , Rfl:  DISCONTD: insulin glargine (LANTUS SOLOSTAR) 100 UNIT/ML injection, Inject 18 Units into the skin at bedtime. Increase dose by one unit every 4 days until most AM BGs are in the 80-120 range., Disp: 5 pen, Rfl: 6  Allergies as of 06/24/2012 - Review Complete 06/24/2012    Allergen Reaction Noted  . Ivp dye (iodinated diagnostic agents) Shortness Of Breath 05/19/2012  . Diphenhydramine hcl Swelling   . Fish allergy  05/29/2011  . Iodine  05/21/2012  . Iohexol  07/19/2005  . Penicillins Other (See Comments)   . Povidone Other (See Comments)   . Promethazine hcl Other (See Comments)   . Tape  08/06/2011  . Clindamycin Rash 05/17/2008  . Morphine sulfate Nausea And Vomiting and Rash     1. Work and Family: She spends a lot of her time back and forth to her many physicians. 2. Activities: Same 3. Smoking, alcohol, or drugs: none 4. Primary Care Provider: Margaree Mackintosh, MD 5. Cardiologist: Dr. Julieanne Manson, who will retire soon. 6. GYN: Dr. Osborn Coho 7. GYN oncologist: Dr. De Blanch  ROS: There are no other significant problems involving Fallon's other body systems.   Objective:  Vital Signs:  BP 126/87  Pulse 83  Wt 322 lb (146.058 kg)   Ht Readings from Last 3 Encounters:  05/21/12 5\' 7"  (1.702 m)  05/19/12 5\' 7"  (1.702 m)  04/29/12 5\' 7"  (1.702 m)   Wt Readings from Last 3 Encounters:  06/24/12 322 lb (146.058 kg)  05/21/12 327 lb (148.326 kg)  05/19/12 337 lb 6.4 oz (153.044 kg)    There is no height on file to calculate BSA.  PHYSICAL EXAM:  Constitutional: The patient appears healthy and well nourished. She is very alert and bright today. This is probably the best that I've ever seen her.  Although she has los weight recently, her weight is the same as it was at our clinic on 04/24/12.. Face: The face appears normal.  Eyes: There is no obvious arcus or proptosis. Moisture appears normal. Mouth: The oropharynx and tongue appear normal. Oral moisture is good today. Neck: The neck appears to be visibly normal. No carotid bruits are noted. The thyroid gland is 20 grams in size. The consistency of the thyroid gland is normal. The thyroid gland is not tender to palpation. Lungs: The lungs are clear to auscultation. Air  movement is good. Heart: Heart rate and rhythm are occasionally irregular. Heart sounds S1 and S2 are normal. She also has a soft, intermittent S4. I did not appreciate any pathologic cardiac murmurs. Abdomen: The abdomen is quite enlarged. Bowel sounds are normal. There is  no obvious hepatomegaly, splenomegaly, or other mass effect. RLQ is mildly tender. Arms: Muscle size and bulk are normal for age. Hands: There is no obvious tremor. Phalangeal and metacarpophalangeal joints are normal. Palmar muscles are normal. Palmar skin is normal. Palmar moisture is also normal. Legs: Muscles appear normal for age. Leg edema is significantly reduced, but is still 1+.  Feet: Feet are normally formed. She has 2-3+ pitting edema of the dorsa.  Neurologic: Strength is normal for age in the arms, but decreased in the hip flexors. Sensation to touch is normal in both legs and dorsa of the feet.  LAB DATA:  Recent Results (from the past 504 hour(s))  GLUCOSE, POCT (MANUAL RESULT ENTRY)   Collection Time   06/24/12  2:02 PM      Component Value Range   POC Glucose 145 (*) 70 - 99 mg/dl  POCT GLYCOSYLATED HEMOGLOBIN (HGB A1C)   Collection Time   06/24/12  2:03 PM      Component Value Range   Hemoglobin A1C 6.8    Her HbA1c is 6.8% today, compared with 8.0% at last visit.   Assessment and Plan:   ASSESSMENT:  1. Insulin-requiring T2DM:  The patient's BGs have gradually responding to her increasing doses of Lantus and her other meds.  Her subjective symptoms have improved in parallel. She is doing much better.  2. Hypoglycemia/neuroglycopenia: The patient appears to be having nocturnal hypoglycemia. Although the Januvia will not cause hypoglycemia, the combination of Lantus and glyburide may well do so.    3. A-fib: The arrhythmia comes and goes of its own volition. 4. Leg edema: Edema is worse since she no longer has the osmotic diuresis.  5. Hypertension: Better 6. Abdominal pains: Her abdominal pains  improved after she had the D&C, just as her GYN, Dr. Su Hilt, predicted.  7. Iron deficiency: Dr. Lenord Fellers put her on iron. We'll see how she does.   PLAN:  1. Diagnostic: Check BGs three times a day, before breakfast, before supper , and at bedtime. Follow the bedtime snack plan.  2. Therapeutic:   A. Watch salt intake tightly. Call BG results in one week. Bring in meter for download one month later.  B. Hold Lantus at current dose for now. Reduce glyburide to 5 mg at supper. Follow the HS snack plan. Can use the Very Small column if desired. Call in BGs next week. Will consider increasing the Lantus again. 3. Patient education: Until she can lose significant amounts of weight, she will need to continue the insulin and oral agent therapies.  4. Follow-up: 3 months  Level of Service: This visit lasted in excess of 40 minutes. More than 50% of the visit was devoted to counseling.  David Stall, MD 06/24/2012 2:08 PM

## 2012-06-30 ENCOUNTER — Encounter: Payer: Medicare Other | Admitting: Obstetrics and Gynecology

## 2012-07-15 DIAGNOSIS — R931 Abnormal findings on diagnostic imaging of heart and coronary circulation: Secondary | ICD-10-CM

## 2012-07-15 HISTORY — DX: Abnormal findings on diagnostic imaging of heart and coronary circulation: R93.1

## 2012-07-16 NOTE — Progress Notes (Signed)
This encounter was created in error - please disregard.

## 2012-07-18 ENCOUNTER — Encounter: Payer: Medicare Other | Admitting: Obstetrics and Gynecology

## 2012-07-22 ENCOUNTER — Ambulatory Visit (INDEPENDENT_AMBULATORY_CARE_PROVIDER_SITE_OTHER): Payer: Medicare Other | Admitting: Internal Medicine

## 2012-07-22 ENCOUNTER — Encounter: Payer: Self-pay | Admitting: Internal Medicine

## 2012-07-22 VITALS — BP 132/82 | HR 70 | Ht 67.0 in | Wt 322.0 lb

## 2012-07-22 DIAGNOSIS — E662 Morbid (severe) obesity with alveolar hypoventilation: Secondary | ICD-10-CM

## 2012-07-22 DIAGNOSIS — G4733 Obstructive sleep apnea (adult) (pediatric): Secondary | ICD-10-CM

## 2012-07-22 DIAGNOSIS — I2789 Other specified pulmonary heart diseases: Secondary | ICD-10-CM

## 2012-07-22 NOTE — Patient Instructions (Addendum)
Ask Dr Lenord Fellers about fluid retention with Mirena IUD  Check your insurance coverage of Ventavis as a treatment for pulmonary hypertension  Continue CPAP 9 with O2 2L for sleep

## 2012-07-22 NOTE — Progress Notes (Signed)
Subjective:    Patient ID: [name], female    DOB: [birth year], 62 y.o.   MRN: [id number]  HPI 05/29/11- 48 yo morbidly obese, followed for OSA, pulmonary hypertension, complicated by DM, thyroid disease, AFib/pacemaker Was hosp with CHF/ AFIB in December- Dr Caprice Kluver. Improved temporarily, finding  Mitral Regurgitation. Had cath- clean coronaries  and TEE, then PFT and  CT at [facility]. Pulmonary Hypertension. Amiodarone was increased and she converted to NSR. Pacemaker has been replaced.  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 She questions "cardiac asthma". Dyspnea/ chest tightness responds to rescue inhaler, but little wheeze or cough. Today humid weather makes her more short of breath. Continues Spiriva When more restless at night it corresponds to note of more fluid retention next day. We discussed restless sleep as suggesting hypoxia. Uses CPAP all night every night at 9.   09/07/11-  60 yoF morbidly obese, followed for OSA, pulmonary hypertension, complicated by DM, thyroid disease, AFib/pacemaker, hx MGUS She describes problem with bruising 2 weeks ago. She saw Dr Clarene Duke and Dr Lenord Fellers-  Given Rocephin and doxycycline empirically. She is better now, but remains tireder than usual. She did not recognize an obvious infection otherwise. ONOX 08/31/11- </= 88% for 133 minutes. Reviewed with her and agreed she meets indications to add O2 for her CPAP, on the basis of obesity hypoventilation syndrome.  Breathing is comfortable now at rest and labored with exertion. She still tries to do water aerobics.  She wants to wait on flu vax till she finishes this round of doxycycline.   07/22/12-  60 yoF morbidly obese, followed for OSA, pulmonary hypertension, complicated by DM, thyroid disease, AFib/pacemaker, hx MGUS Wear  CPAP 9/ Advanced for 7 days week approx 8 hours night. on 2 L of O2 at night At recent cardiology followup with Dr. Leroy Libman 07/15/2012 moderate to severe mitral  regurgitation, RV mildly dilated with pacemaker lead,  LV severely dilated. EF 40-45%, PAP 55, severe TR, RVSP 50-60 Hospitalized in the spring with uterine bleed, abnormal GYN cytology being treated with Mirena IUD. Retaining fluid she blames on hormones. Continues doing water exercises at gym.  Review of Systems- see HPI Constitutional:   No weight loss, night sweats,  Fevers, chills, fatigue, lassitude. HEENT:   No headaches,  Difficulty swallowing,  Tooth/dental problems,  Sore throat,                No sneezing, itching, ear ache, nasal congestion, post nasal drip,  CV:  No acute chest pain, orthopnea, PND,  anasarca, dizziness, palpitations. + peripheral edema GI  No heartburn, indigestion, abdominal pain, nausea, vomiting,  Resp: No excess mucus, no productive cough,  No non-productive cough,  No coughing up of blood.  No change in color of mucus.  No wheezing.   Skin: no rash or lesions. GU: . MS:  No joint pain or swelling.  Marland Kitchen Psych:  No change in mood or affect. No expressed depression or anxiety.  No memory loss.  Objective:   Physical Exam General- Alert, Oriented, Affect-appropriate, Distress- none acute  Massively obese. O2 sat at rest RA 94% Skin- rash-none, lesions- none, excoriation- none Lymphadenopathy- none Head- atraumatic            Eyes- Gross vision intact, PERRLA, conjunctivae clear secretions            Ears- Hearing, canals normal            Nose- Clear, no-Septal dev, mucus, polyps, erosion, perforation  Throat- Mallampati II , mucosa clear , drainage- none, tonsils- atrophic Neck- flexible , trachea midline, no stridor , thyroid nl, carotid no bruit Chest - symmetrical excursion , unlabored           Heart/CV- RRR/ paced , no murmur , no gallop  , no rub, nl s1 s2                           - JVD+ , edema- 3-4+ chronic edema, stasis changes, varices- none           Lung- clear to P&A, but shallow consistent with her body habitus, wheeze- none,  cough- none , dullness-none, rub- none           Chest wall-  Abd-  Br/ Gen/ Rectal- Not done, not indicated Extrem- cyanosis- none, clubbing, none, atrophy- none, strength- nl Neuro- grossly intact to observation Assessment & Plan:

## 2012-07-26 ENCOUNTER — Encounter: Payer: Self-pay | Admitting: Internal Medicine

## 2012-07-26 NOTE — Assessment & Plan Note (Signed)
She has been unsuccessful at weight loss.

## 2012-07-26 NOTE — Assessment & Plan Note (Signed)
Pressure is secondary to her global cardiomyopathy and her obesity hypoventilation. Any component from her sleep apnea should be compensated by her CPAP plus oxygen at night. Question if there would be any value in considering treatment with bosentan or Revatio.

## 2012-07-26 NOTE — Assessment & Plan Note (Signed)
Good compliance and control. Pressure seems appropriate.

## 2012-08-11 ENCOUNTER — Encounter (HOSPITAL_COMMUNITY): Payer: Medicare Other | Attending: Oncology

## 2012-08-11 ENCOUNTER — Other Ambulatory Visit: Payer: Self-pay | Admitting: *Deleted

## 2012-08-11 DIAGNOSIS — D509 Iron deficiency anemia, unspecified: Secondary | ICD-10-CM | POA: Insufficient documentation

## 2012-08-11 DIAGNOSIS — D472 Monoclonal gammopathy: Secondary | ICD-10-CM | POA: Insufficient documentation

## 2012-08-11 DIAGNOSIS — G4730 Sleep apnea, unspecified: Secondary | ICD-10-CM | POA: Insufficient documentation

## 2012-08-11 DIAGNOSIS — E119 Type 2 diabetes mellitus without complications: Secondary | ICD-10-CM | POA: Insufficient documentation

## 2012-08-11 DIAGNOSIS — E785 Hyperlipidemia, unspecified: Secondary | ICD-10-CM | POA: Insufficient documentation

## 2012-08-11 DIAGNOSIS — E669 Obesity, unspecified: Secondary | ICD-10-CM | POA: Insufficient documentation

## 2012-08-11 DIAGNOSIS — E569 Vitamin deficiency, unspecified: Secondary | ICD-10-CM

## 2012-08-11 DIAGNOSIS — I509 Heart failure, unspecified: Secondary | ICD-10-CM | POA: Insufficient documentation

## 2012-08-11 DIAGNOSIS — I2789 Other specified pulmonary heart diseases: Secondary | ICD-10-CM | POA: Insufficient documentation

## 2012-08-11 DIAGNOSIS — I428 Other cardiomyopathies: Secondary | ICD-10-CM | POA: Insufficient documentation

## 2012-08-11 LAB — COMPREHENSIVE METABOLIC PANEL
ALT: 13 U/L (ref 0–35)
AST: 13 U/L (ref 0–37)
Albumin: 3.4 g/dL — ABNORMAL LOW (ref 3.5–5.2)
Alkaline Phosphatase: 88 U/L (ref 39–117)
Calcium: 9.6 mg/dL (ref 8.4–10.5)
GFR calc Af Amer: 44 mL/min — ABNORMAL LOW (ref >90)
Glucose, Bld: 159 mg/dL — ABNORMAL HIGH (ref 70–99)
Potassium: 3.9 mEq/L (ref 3.5–5.1)
Sodium: 142 mEq/L (ref 135–145)
Total Protein: 7.5 g/dL (ref 6.0–8.3)

## 2012-08-11 LAB — DIFFERENTIAL
Basophils Absolute: 0 10*3/uL (ref 0.0–0.1)
Basophils Relative: 1 % (ref 0–1)
Eosinophils Absolute: 0.1 10*3/uL (ref 0.0–0.7)
Eosinophils Relative: 1 % (ref 0–5)
Neutrophils Relative %: 74 % (ref 43–77)

## 2012-08-11 LAB — CBC
MCH: 28 pg (ref 26.0–34.0)
MCHC: 31 g/dL (ref 30.0–36.0)
MCV: 90.4 fL (ref 78.0–100.0)
Platelets: 178 10*3/uL (ref 150–400)
RBC: 4.28 MIL/uL (ref 3.87–5.11)
RDW: 16.2 % — ABNORMAL HIGH (ref 11.5–15.5)

## 2012-08-11 MED ORDER — ERGOCALCIFEROL 1.25 MG (50000 UT) PO CAPS: ORAL_CAPSULE | ORAL | Status: DC

## 2012-08-11 NOTE — Progress Notes (Signed)
Labs drawn today for cbc/diff,cmp,kllc,protein elect.,mm panel

## 2012-08-12 LAB — KAPPA/LAMBDA LIGHT CHAINS: Kappa free light chain: 7.53 mg/dL — ABNORMAL HIGH (ref 0.33–1.94)

## 2012-08-13 LAB — PROTEIN ELECTROPHORESIS, SERUM
Alpha-1-Globulin: 5.1 % — ABNORMAL HIGH (ref 2.9–4.9)
Alpha-2-Globulin: 9.8 % (ref 7.1–11.8)
Beta Globulin: 8 % — ABNORMAL HIGH (ref 4.7–7.2)
Gamma Globulin: 14.4 % (ref 11.1–18.8)
M-Spike, %: 0.55 g/dL

## 2012-08-13 LAB — MULTIPLE MYELOMA PANEL, SERUM
Albumin ELP: 49.2 % — ABNORMAL LOW (ref 55.8–66.1)
Alpha-1-Globulin: 5.1 % — ABNORMAL HIGH (ref 2.9–4.9)
Beta Globulin: 8.5 % — ABNORMAL HIGH (ref 4.7–7.2)
IgA: 663 mg/dL — ABNORMAL HIGH (ref 69–380)
IgM, Serum: 78 mg/dL (ref 52–322)
Total Protein: 6.9 g/dL (ref 6.0–8.3)

## 2012-08-19 ENCOUNTER — Encounter (HOSPITAL_COMMUNITY): Payer: Self-pay | Admitting: Oncology

## 2012-08-19 ENCOUNTER — Encounter (HOSPITAL_BASED_OUTPATIENT_CLINIC_OR_DEPARTMENT_OTHER): Payer: Medicare Other | Admitting: Oncology

## 2012-08-19 VITALS — BP 103/69 | HR 71 | Temp 97.9°F | Resp 18 | Wt 322.0 lb

## 2012-08-19 DIAGNOSIS — D472 Monoclonal gammopathy: Secondary | ICD-10-CM

## 2012-08-19 DIAGNOSIS — E611 Iron deficiency: Secondary | ICD-10-CM

## 2012-08-19 DIAGNOSIS — M7989 Other specified soft tissue disorders: Secondary | ICD-10-CM

## 2012-08-19 DIAGNOSIS — I509 Heart failure, unspecified: Secondary | ICD-10-CM

## 2012-08-19 DIAGNOSIS — D500 Iron deficiency anemia secondary to blood loss (chronic): Secondary | ICD-10-CM

## 2012-08-19 MED ORDER — POLYSACCHARIDE IRON COMPLEX 150 MG PO CAPS: 150.0000 mg | ORAL_CAPSULE | Freq: Two times a day (BID) | ORAL | Status: DC

## 2012-08-19 NOTE — Patient Instructions (Addendum)
[name]  DOB 1950/12/02 CSN [id number]  MRN [id number] Dr. Glenford Peers  [facility]  Discharge Instructions  RECOMMENDATIONS MADE BY THE CONSULTANT AND ANY TEST RESULTS WILL BE SENT TO YOUR REFERRING DOCTOR.   EXAM FINDINGS BY MD TODAY AND SIGNS AND SYMPTOMS TO REPORT TO CLINIC OR PRIMARY MD: exam and discussion per MD.  Your labs are stable and area on left chest is stable.  It is slightly larger but is moveable and soft.  MEDICATIONS PRESCRIBED: niferex take 1 daily.  E-scribed to your pharmacy. Follow label directions  INSTRUCTIONS GIVEN AND DISCUSSED: Other :  Report recurring infections, night sweats, etc.  SPECIAL INSTRUCTIONS/FOLLOW-UP: Lab work Needed in 7 months and Return to Clinic to see MD after labs in 7 months.   I acknowledge that I have been informed and understand all the instructions given to me and received a copy. I do not have any more questions at this time, but understand that I may call the Specialty [facility] at [facility] at [phone] during business hours should I have any further questions or need assistance in obtaining follow-up care.    __________________________________________  _____________  __________ Signature of Patient or Authorized Representative            Date                   Time    __________________________________________ Nurse's Signature

## 2012-08-19 NOTE — Progress Notes (Signed)
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 The IgA kappa is slightly higher than it was 3 years ago but not significantly different. Her IgG lambda is identical total was 3 years ago essentially. She is not anemic still has a normal calcium decent kidney function etc.  Problem #2 iron deficiency due to GU blood losses requiring GU intervention and she has stopped the ferrous sulfate after intolerance. Her last iron studies in May still showed iron deficiency that was clear-cut. I will put her on prescription Niferex 1 a day until she sees her primary care physician for repeat iron study testing in October.  Problem #3 CHF with cardiomyopathy complicated by pulmonary hypertension followed by Dr. little and associates  Problem #4 massive obesity  Problem #5 sleep apnea Problem #6 hyperlipidemia Problem #7 diabetes mellitus Problem #8 sick sinus syndrome status post [employer] pacemaker insertion Problem #9 history of Hashimoto's thyroiditis  Several things have happened to the patient since I saw her 7 months ago including his GU blood loss development of iron deficiency. She had an IUD placed to help stop the bleeding. She tells me also that she has worsening pulmonary hypertension but does not want to take the medication that was prescribed for her. She has oxygen that she uses at night along with her CPAP machine to keep her O2 sats above 80% she states. She still living at home. Physical exam vital signs are not changed her heart rate is right around 76 and regular today. She does have a grade 2 systolic ejection murmur without distinct S3 gallop. Abdomen remains very obese with a 2 cm lipoma of the left lower rib cage. Breast exam shows no masses in either breast. Scar the right lower chest wall and anterolateral he from a prior lipoma resection is well-healed. Pacemaker is present in the chest wall on the right. She has massive obesity with some chronic swelling of her legs and the  associated skin changes. She however remains alert and very sharp.  She will see her primary care physician on October but I have given her Niferex for 4 months. Hopefully she will tolerate this and if she does not she will let me know. Otherwise we'll see her in 7 more months and follow her along. If all is well then with her proteins we will follow her 8 months later.

## 2012-08-26 ENCOUNTER — Ambulatory Visit (INDEPENDENT_AMBULATORY_CARE_PROVIDER_SITE_OTHER): Payer: Medicare Other | Admitting: Obstetrics and Gynecology

## 2012-08-26 ENCOUNTER — Encounter: Payer: Self-pay | Admitting: Obstetrics and Gynecology

## 2012-08-26 VITALS — BP 98/60 | Ht 67.0 in | Wt 324.0 lb

## 2012-08-26 DIAGNOSIS — N8502 Endometrial intraepithelial neoplasia [EIN]: Secondary | ICD-10-CM

## 2012-08-26 DIAGNOSIS — Z30431 Encounter for routine checking of intrauterine contraceptive device: Secondary | ICD-10-CM

## 2012-08-26 NOTE — Progress Notes (Signed)
IUD SURVEILLANCE NOTE  MIRENA IUD inserted on 05/21/2012  Pain: none Bleeding: had dark spotting one time.  Patient has checked IUD strings: yes Other complaints: no  EXAM:  IUD strings visualized: yes              Pelvic exam normal: yes  next available for pelvic u/s per Dr. Stanford Breed recs to eval endometrium s/p placement of Mirena IUD for polyps with complex hyperplasia with atypia   Osborn Coho, MD 08/26/2012 10:42 AM

## 2012-08-26 NOTE — Addendum Note (Signed)
Addended by: Osborn Coho on: 08/26/2012 11:56 AM   Modules accepted: Orders

## 2012-09-10 ENCOUNTER — Other Ambulatory Visit: Payer: Self-pay | Admitting: *Deleted

## 2012-09-10 DIAGNOSIS — E569 Vitamin deficiency, unspecified: Secondary | ICD-10-CM

## 2012-09-10 DIAGNOSIS — E559 Vitamin D deficiency, unspecified: Secondary | ICD-10-CM

## 2012-09-10 DIAGNOSIS — E211 Secondary hyperparathyroidism, not elsewhere classified: Secondary | ICD-10-CM

## 2012-09-10 MED ORDER — ERGOCALCIFEROL 1.25 MG (50000 UT) PO CAPS: ORAL_CAPSULE | ORAL | Status: DC

## 2012-09-29 ENCOUNTER — Ambulatory Visit: Payer: Medicare Other | Admitting: "Endocrinology

## 2012-10-15 ENCOUNTER — Ambulatory Visit (INDEPENDENT_AMBULATORY_CARE_PROVIDER_SITE_OTHER): Payer: Medicare Other | Admitting: "Endocrinology

## 2012-10-15 ENCOUNTER — Encounter: Payer: Self-pay | Admitting: "Endocrinology

## 2012-10-15 VITALS — BP 130/85 | HR 85 | Wt 328.5 lb

## 2012-10-15 DIAGNOSIS — R609 Edema, unspecified: Secondary | ICD-10-CM

## 2012-10-15 DIAGNOSIS — E11649 Type 2 diabetes mellitus with hypoglycemia without coma: Secondary | ICD-10-CM

## 2012-10-15 DIAGNOSIS — D509 Iron deficiency anemia, unspecified: Secondary | ICD-10-CM

## 2012-10-15 DIAGNOSIS — E1169 Type 2 diabetes mellitus with other specified complication: Secondary | ICD-10-CM

## 2012-10-15 DIAGNOSIS — E611 Iron deficiency: Secondary | ICD-10-CM

## 2012-10-15 DIAGNOSIS — R5381 Other malaise: Secondary | ICD-10-CM

## 2012-10-15 DIAGNOSIS — R5383 Other fatigue: Secondary | ICD-10-CM

## 2012-10-15 DIAGNOSIS — E069 Thyroiditis, unspecified: Secondary | ICD-10-CM

## 2012-10-15 DIAGNOSIS — E119 Type 2 diabetes mellitus without complications: Secondary | ICD-10-CM

## 2012-10-15 DIAGNOSIS — I10 Essential (primary) hypertension: Secondary | ICD-10-CM

## 2012-10-15 NOTE — Patient Instructions (Signed)
Follow up visit in 3 months. Please bring in your BG meter for download in one month.

## 2012-10-15 NOTE — Progress Notes (Signed)
Subjective:  Patient Name: [name] Date of Birth: 1950/10/08  MRN: [id number]  [name]  presents to the office today for follow-up of her insulin-requiring T2DM, leg edema, left shin ulcer, hypertension, and edema.  HISTORY OF PRESENT ILLNESS:   [name] is a 62 y.o. Caucasian woman. [name] was unaccompanied.  1.The patient was first referred to me on 10/30/05 by her primary care provider, Dr. Sharlet Salina, for evaluation and co-management of her diabetes and obesity. I have followed her for the above problems since then. Please see my clinic note from 06/06/11 for detailed description of the course of her type 2 diabetes mellitus, hypothyroidism secondary to Hashimoto's thyroiditis, and hyperparathyroidism secondary to deficiency of calcium intake, vitamin D intake, and renal conversion of 25-hydroxy vitamin D to 1, 25-dihydroxy vitamin D.  2. At her clinic visit on 03/27/12 she had a left shin ulcer that was open and draining. Her HbA1c from 03/24/12 was 8.0%. Her oral anti-hyperglycemic medications, Januvia, 100 mg/day, and glyburide, 10 mg twice a day, were not adequately controlling her BGs. I finally convinced her to add Lantus insulin, at a dose of 4 units each evening. I also coordinated with Dr. Lenord Fellers and started the patient on topical Bactroban and oral doxycycline. We have gradually but progressively increased her Lantus dose since then and her BGs have improved substantially.  3. The patient's last [facility] visit was on 06/24/12. In the interim, she has had a Mirena IUD inserted in order to suppress the uterine lining. She feels that the IUD is causing her to retain excess tissue fluid. She is now taking 30 units of Lantus each evening. She also takes glyburide, 10 mg at breakfast and 5 mg at supper and Januvia, 100 each AM. She has had two more episodes of symptomatic low BG during the night when she did not follow the Small scale bedtime snack plan.  4. Pertinent Review of  Systems:  Constitutional: The patient generally feels better since her BGs have been decreasing. Her energy is not as good recently. She sleeps well with her CPAP and oxygen at 2 liters. Eyes: Vision is not as good as it was. She needs to have well-lit environments. She has not had any more stys. There are no other significant eye complaints. Neck: The patient has had more anterior neck swelling, soreness, tenderness,  pressure, and discomfort in the past three weeks.   Heart: She has A-fib frequently. Her pacemaker is still working. She still has occasional chest pains.  Lungs: She has had intermittent wheezing. She will follow up with Dr. Maple Hudson. Gastrointestinal: She has been having very little nausea.  Bowel movents seem normal. She has not had much abdominal pain recently.   Legs: Muscle mass and strength seem normal. There are no complaints of numbness, tingling, burning, or pain. Edema is increased again in both legs.  Feet: There are no obvious foot problems. There are no complaints of numbness or pain. She has more foot swelling. She does have tingling and burning when the feet are more swollen. Feet are also colder then.  GYN: As above Hypoglycemia: Occasionally around 3:00 AM when she forgets to follow the bedtime snack plan.  5. BG printout: Her mean BG for the past month is 152.6, compared with 158.5 at last visit. Her BG range for the past month is 69-189, compared with 133-238 at last visit. Overall her BGs are much better recently.  PAST MEDICAL, FAMILY, AND SOCIAL HISTORY:  Past Medical History  Diagnosis Date  . Personal history of other diseases of circulatory system   . Cardiac pacemaker in situ   . Chronic lymphocytic thyroiditis   . Morbid obesity   . Other chronic pulmonary heart diseases   . Obstructive sleep apnea (adult) (pediatric)   . Other and unspecified hyperlipidemia   . Fibromyalgia   . Goiter   . Thyroiditis, autoimmune   . Hypothyroidism, acquired,  autoimmune   . Solitary kidney, acquired   . Fatigue   . Hyperparathyroidism, secondary   . Vitamin D deficiency disease   . Hyperparathyroidism   . H/O varicose veins 03/27/12  . Type II or unspecified type diabetes mellitus without mention of complication, not stated as uncontrolled   . Diabetes mellitus type II   . Infections of kidney 03/27/12  . History of measles, mumps, or rubella 03/27/12  . Tubal pregnancy 12/23/76  . Unspecified essential hypertension     20 yrs ago  . Blood transfusion 1977    [facility]  . Complication of anesthesia 2010    hard to wake up after knee surgery  . Anemia   . Cardiomyopathy, nonischemic   . Endometrial hyperplasia with atypia 05/05/12    Family History  Problem Relation Age of Onset  . Cardiomyopathy Brother   . Diabetes Mother   . Cancer Maternal Grandmother     Current outpatient prescriptions:albuterol (PROAIR HFA) 108 (90 BASE) MCG/ACT inhaler, Inhale 2 puffs into the lungs every 6 (six) hours as needed.  , Disp: , Rfl: ;  ALPRAZolam (XANAX) 0.5 MG tablet, as needed. , Disp: , Rfl: ;  amiodarone (CORDARONE) 200 MG tablet, Take 300 mg by mouth daily. Takes 1 and 1/2 tablet daily., Disp: , Rfl:  calcitRIOL (ROCALTROL) 0.25 MCG capsule, Take 1 capsule (0.25 mcg total) by mouth 2 (two) times daily., Disp: 60 capsule, Rfl: 6;  Calcium-Magnesium-Vitamin D (CITRACAL CALCIUM+D) 600-40-500 MG-MG-UNIT TB24, Take 1 tablet by mouth 2 (two) times daily. , Disp: , Rfl: ;  digoxin (LANOXIN) 0.25 MG tablet, Take 250 mcg by mouth daily.  , Disp: , Rfl:  DULoxetine (CYMBALTA) 30 MG capsule, Take 30 mg by mouth daily. Occasionally takes twice daily if in pain from fibromyalgia., Disp: , Rfl: ;  ergocalciferol (VITAMIN D2) 50000 UNITS capsule, Take 1 tablet (50,000 units) twice weekly-- Tuesday and Friday., Disp: 8 capsule, Rfl: 5;  glyBURIDE (DIABETA) 5 MG tablet, 2 tablets in the am and 1 at supper, Disp: , Rfl:  HYDROcodone-acetaminophen (LORCET 10/650) 10-650 MG per  tablet, Take 1 tablet by mouth every 8 (eight) hours as needed for pain., Disp: 90 tablet, Rfl: 5;  insulin glargine (LANTUS) 100 UNIT/ML injection, Inject 30 Units into the skin at bedtime. Increase dose by one unit every 4 days until most AM BGs are in the 80-120 range., Disp: , Rfl:  iron polysaccharides (NIFEREX) 150 MG capsule, Take 1 capsule (150 mg total) by mouth 2 (two) times daily., Disp: 30 capsule, Rfl: 3;  JANUVIA 100 MG tablet, Take 1 tablet by mouth Daily., Disp: , Rfl: ;  levothyroxine (SYNTHROID, LEVOTHROID) 25 MCG tablet, Take  62.5 mcg on odd days and 75 mcg on even days, Disp: , Rfl: ;  metolazone (ZAROXOLYN) 5 MG tablet, Take 10 mg by mouth daily. For edema, Disp: , Rfl:  metoprolol tartrate (LOPRESSOR) 25 MG tablet, Take 25 mg by mouth 2 (two) times daily.  , Disp: , Rfl: ;  Multiple Vitamin (MULTIVITAMIN) tablet, Take 1 tablet by mouth daily.  ,  Disp: , Rfl: ;  mupirocin ointment (BACTROBAN) 2 %, Apply 1 application topically 2 (two) times daily. To leg blister , Disp: , Rfl: ;  nitroGLYCERIN (NITRO-DUR) 0.4 mg/hr, Patch on at night and off in am. 1 patch every other day., Disp: , Rfl:  ONE TOUCH ULTRA TEST test strip, , Disp: , Rfl: ;  potassium chloride SA (K-DUR,KLOR-CON) 20 MEQ tablet, Take 20 mEq by mouth 2 (two) times daily. , Disp: , Rfl: ;  tiotropium (SPIRIVA) 18 MCG inhalation capsule, Place 18 mcg into inhaler and inhale every other day. , Disp: , Rfl: ;  warfarin (COUMADIN) 5 MG tablet, Take 5 mg by mouth daily. 5 mg on even days. 2.5 mg on odd days Pacemaker- complete heart block and AF, Disp: , Rfl:  zolpidem (AMBIEN) 10 MG tablet, Take 10 mg by mouth at bedtime as needed. sleep, Disp: , Rfl:   Allergies as of 10/15/2012 - Review Complete 10/15/2012  Allergen Reaction Noted  . Ivp dye (iodinated diagnostic agents) Shortness Of Breath 05/19/2012  . Diphenhydramine hcl Swelling   . Fish allergy  05/29/2011  . Iodine  05/21/2012  . Iohexol  07/19/2005  . Penicillins  Other (See Comments)   . Povidone Other (See Comments)   . Promethazine hcl Other (See Comments)   . Tape  08/06/2011  . Clindamycin Rash 05/17/2008  . Morphine sulfate Nausea And Vomiting and Rash     1. Work and Family: She spends a lot of her time back and forth to her many physicians. 2. Activities: Same 3. Smoking, alcohol, or drugs: none 4. Primary Care Provider: Margaree Mackintosh, MD 5. Cardiologist: Dr. Royann Shivers 6. GYN: Dr. Osborn Coho 7. GYN oncologist: Dr. De Blanch  ROS: There are no other significant problems involving Kristiana's other body systems.   Objective:  Vital Signs:  BP 130/85  Pulse 85  Wt 328 lb 8 oz (149.007 kg)   Ht Readings from Last 3 Encounters:  08/26/12 5\' 7"  (1.702 m)  07/22/12 5\' 7"  (1.702 m)  05/21/12 5\' 7"  (1.702 m)   Wt Readings from Last 3 Encounters:  10/15/12 328 lb 8 oz (149.007 kg)  08/26/12 324 lb (146.965 kg)  08/19/12 322 lb (146.058 kg)    There is no height on file to calculate BSA.  PHYSICAL EXAM:  Constitutional: The patient appears healthy and well nourished. She is very alert and bright today, but seems somewhat more tired. She has regained 6 pounds since last visit.  Face: The face appears normal.  Eyes: There is no obvious arcus or proptosis. Moisture appears normal. Mouth: The oropharynx and tongue appear normal. Oral moisture is good today. Neck: The neck appears to be visibly normal. No carotid bruits are noted. The thyroid gland is 18-20 grams in size. The consistency of the thyroid gland is normal. The thyroid gland is tender to palpation today, more tender on the right lobe than on the left. . Lungs: The lungs are clear to auscultation. Air movement is good. Heart: Heart rate and rhythm are occasionally irregular. Heart sounds S1 and S2 are normal. She also has a soft, intermittent S4. I did not appreciate any pathologic cardiac murmurs. Abdomen: The abdomen is quite enlarged. Bowel sounds are normal.  There is no obvious hepatomegaly, splenomegaly, or other mass effect. RLQ is mildly tender. Arms: Muscle size and bulk are normal for age. Hands: There is no obvious tremor. Phalangeal and metacarpophalangeal joints are normal. Palmar muscles are normal. Palmar skin is normal. Palmar  moisture is also normal. Legs: Muscles appear normal for age. Leg edema is significantly increased again to 2-3+.  Feet: Feet are normally formed. She has 2-3+ pitting edema of the dorsa.  Neurologic: Strength is normal for age in the arms, but decreased in the hip flexors. Sensation to touch is normal in both legs and dorsa of the feet.  LAB DATA:  Recent Results (from the past 504 hour(s))  GLUCOSE, POCT (MANUAL RESULT ENTRY)   Collection Time   10/15/12  8:04 AM      Component Value Range   POC Glucose 171 (*) 70 - 99 mg/dl  POCT GLYCOSYLATED HEMOGLOBIN (HGB A1C)   Collection Time   10/15/12  8:07 AM      Component Value Range   Hemoglobin A1C 7.1    Her HbA1c is 7.1% today, compared with 6.8% at last visit and with 8.0% at the visit prior. The higher A1c probably reflects higher BGs 2-3 months ago..   Assessment and Plan:   ASSESSMENT:  1. Insulin-requiring T2DM:  The patient's BGs have gradually responding to her increasing doses of Lantus and her other meds.  Her subjective symptoms have improved in parallel. She is doing much better.  2. Hypoglycemia/neuroglycopenia: The patient has occasional nocturnal hypoglycemia when she forgets to check her BG at bedtime and take a snack if necessary. She is not having hypoglycemia during the day, when the combination of Lantus and glyburide is most active.  Although the Januvia will not cause hypoglycemia, the combination of Lantus and glyburide may well do so.    3. A-fib: The arrhythmia comes and goes of its own volition. 4. Leg edema: Edema is worse since she no longer has the osmotic diuresis.  5. Hypertension: BPs are better 6. Abdominal pains: Her  abdominal pains have essentially resolved.   7. Iron deficiency: Dr. Lenord Fellers put her on iron. We'll see how she does.  8. 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   9. Fatigue: Although anemia due to iron deficiency is a leading cause of fatigue, hypothyroidism must be re-evaluated.  PLAN:  1. Diagnostic: TFTs, CMP, CBC, and iron. Check BGs three times a day, before breakfast, before supper, and at bedtime. Bring in BG meter in one month for download.  2. Therapeutic:   A. Watch salt intake tightly.   B. Continue Lantus, glyburide, and Januvia at their current doses.   Follow the Very Small scale HS snack plan.  Will consider increasing the Lantus to 31-32 units in one month. Call your cardiologis re increased edema.  3. Patient education: Until she can lose significant amounts of weight, she will need to continue the insulin and oral agent therapies.  4. Follow-up: 3 months  Level of Service: This visit lasted in excess of 60 minutes. More than 50% of the visit was devoted to counseling.  David Stall, MD 10/15/2012 8:28 AM

## 2012-10-20 ENCOUNTER — Other Ambulatory Visit: Payer: Self-pay | Admitting: *Deleted

## 2012-10-20 MED ORDER — SITAGLIPTIN PHOSPHATE 100 MG PO TABS: ORAL_TABLET | ORAL | Status: DC

## 2012-10-23 ENCOUNTER — Ambulatory Visit: Payer: Medicare Other | Admitting: Internal Medicine

## 2012-10-27 LAB — TSH: TSH: 3.745 u[IU]/mL (ref 0.350–4.500)

## 2012-10-27 LAB — COMPREHENSIVE METABOLIC PANEL
Albumin: 3.6 g/dL (ref 3.5–5.2)
Alkaline Phosphatase: 66 U/L (ref 39–117)
BUN: 39 mg/dL — ABNORMAL HIGH (ref 6–23)
CO2: 29 mEq/L (ref 19–32)
Calcium: 9 mg/dL (ref 8.4–10.5)
Chloride: 99 mEq/L (ref 96–112)
Glucose, Bld: 178 mg/dL — ABNORMAL HIGH (ref 70–99)
Potassium: 4.4 mEq/L (ref 3.5–5.3)
Sodium: 140 mEq/L (ref 135–145)
Total Protein: 7.1 g/dL (ref 6.0–8.3)

## 2012-10-27 LAB — T4, FREE: Free T4: 2.12 ng/dL — ABNORMAL HIGH (ref 0.80–1.80)

## 2012-10-27 LAB — CBC
MCH: 26.5 pg (ref 26.0–34.0)
Platelets: 199 10*3/uL (ref 150–400)
RBC: 4.37 MIL/uL (ref 3.87–5.11)

## 2012-10-27 LAB — T3, FREE: T3, Free: 2.6 pg/mL (ref 2.3–4.2)

## 2012-10-28 LAB — THYROID PEROXIDASE ANTIBODY: Thyroperoxidase Ab SerPl-aCnc: <10 IU/mL (ref <35.0)

## 2012-10-28 LAB — MICROALBUMIN / CREATININE URINE RATIO
Creatinine, Urine: 95.6 mg/dL
Microalb Creat Ratio: 89.6 mg/g — ABNORMAL HIGH (ref 0.0–30.0)

## 2012-10-30 ENCOUNTER — Ambulatory Visit: Payer: Medicare Other | Admitting: Internal Medicine

## 2012-11-03 ENCOUNTER — Inpatient Hospital Stay (HOSPITAL_COMMUNITY)
Admission: AD | Admit: 2012-11-03 | Discharge: 2012-11-08 | DRG: 292 | Disposition: A | Discharge status: Home / Self Care | Payer: Medicare Other | Source: Ambulatory Visit | Attending: Cardiovascular Disease | Admitting: Cardiovascular Disease

## 2012-11-03 DIAGNOSIS — E785 Hyperlipidemia, unspecified: Secondary | ICD-10-CM | POA: Diagnosis present

## 2012-11-03 DIAGNOSIS — Z6841 Body Mass Index (BMI) 40.0 and over, adult: Secondary | ICD-10-CM

## 2012-11-03 DIAGNOSIS — Z8679 Personal history of other diseases of the circulatory system: Secondary | ICD-10-CM

## 2012-11-03 DIAGNOSIS — E860 Dehydration: Secondary | ICD-10-CM | POA: Diagnosis present

## 2012-11-03 DIAGNOSIS — E611 Iron deficiency: Secondary | ICD-10-CM

## 2012-11-03 DIAGNOSIS — I10 Essential (primary) hypertension: Secondary | ICD-10-CM | POA: Diagnosis present

## 2012-11-03 DIAGNOSIS — Z888 Allergy status to other drugs, medicaments and biological substances status: Secondary | ICD-10-CM

## 2012-11-03 DIAGNOSIS — Z91041 Radiographic dye allergy status: Secondary | ICD-10-CM

## 2012-11-03 DIAGNOSIS — I4891 Unspecified atrial fibrillation: Secondary | ICD-10-CM | POA: Diagnosis present

## 2012-11-03 DIAGNOSIS — E063 Autoimmune thyroiditis: Secondary | ICD-10-CM | POA: Diagnosis present

## 2012-11-03 DIAGNOSIS — Z950 Presence of cardiac pacemaker: Secondary | ICD-10-CM

## 2012-11-03 DIAGNOSIS — E211 Secondary hyperparathyroidism, not elsewhere classified: Secondary | ICD-10-CM

## 2012-11-03 DIAGNOSIS — Z880 Allergy status to penicillin: Secondary | ICD-10-CM

## 2012-11-03 DIAGNOSIS — I509 Heart failure, unspecified: Secondary | ICD-10-CM | POA: Diagnosis present

## 2012-11-03 DIAGNOSIS — E662 Morbid (severe) obesity with alveolar hypoventilation: Secondary | ICD-10-CM | POA: Diagnosis present

## 2012-11-03 DIAGNOSIS — I5042 Chronic combined systolic (congestive) and diastolic (congestive) heart failure: Secondary | ICD-10-CM | POA: Diagnosis present

## 2012-11-03 DIAGNOSIS — Z905 Acquired absence of kidney: Secondary | ICD-10-CM

## 2012-11-03 DIAGNOSIS — N2581 Secondary hyperparathyroidism of renal origin: Secondary | ICD-10-CM | POA: Diagnosis present

## 2012-11-03 DIAGNOSIS — R5381 Other malaise: Secondary | ICD-10-CM | POA: Diagnosis present

## 2012-11-03 DIAGNOSIS — Z79899 Other long term (current) drug therapy: Secondary | ICD-10-CM

## 2012-11-03 DIAGNOSIS — E038 Other specified hypothyroidism: Secondary | ICD-10-CM | POA: Diagnosis present

## 2012-11-03 DIAGNOSIS — E559 Vitamin D deficiency, unspecified: Secondary | ICD-10-CM

## 2012-11-03 DIAGNOSIS — Z7902 Long term (current) use of antithrombotics/antiplatelets: Secondary | ICD-10-CM

## 2012-11-03 DIAGNOSIS — Z7901 Long term (current) use of anticoagulants: Secondary | ICD-10-CM

## 2012-11-03 DIAGNOSIS — G4733 Obstructive sleep apnea (adult) (pediatric): Secondary | ICD-10-CM | POA: Diagnosis present

## 2012-11-03 DIAGNOSIS — I428 Other cardiomyopathies: Secondary | ICD-10-CM | POA: Diagnosis present

## 2012-11-03 DIAGNOSIS — Z881 Allergy status to other antibiotic agents status: Secondary | ICD-10-CM

## 2012-11-03 DIAGNOSIS — E119 Type 2 diabetes mellitus without complications: Secondary | ICD-10-CM | POA: Diagnosis present

## 2012-11-03 DIAGNOSIS — I5033 Acute on chronic diastolic (congestive) heart failure: Secondary | ICD-10-CM

## 2012-11-03 DIAGNOSIS — Z794 Long term (current) use of insulin: Secondary | ICD-10-CM

## 2012-11-03 DIAGNOSIS — IMO0001 Reserved for inherently not codable concepts without codable children: Secondary | ICD-10-CM | POA: Diagnosis present

## 2012-11-03 DIAGNOSIS — I2789 Other specified pulmonary heart diseases: Secondary | ICD-10-CM | POA: Diagnosis present

## 2012-11-03 DIAGNOSIS — Z9089 Acquired absence of other organs: Secondary | ICD-10-CM

## 2012-11-03 HISTORY — DX: Unspecified diastolic (congestive) heart failure: I50.30

## 2012-11-03 LAB — BASIC METABOLIC PANEL
BUN: 26 mg/dL — ABNORMAL HIGH (ref 6–23)
CO2: 31 mEq/L (ref 19–32)
Calcium: 9.4 mg/dL (ref 8.4–10.5)
Creatinine, Ser: 1.21 mg/dL — ABNORMAL HIGH (ref 0.50–1.10)
Glucose, Bld: 201 mg/dL — ABNORMAL HIGH (ref 70–99)

## 2012-11-03 LAB — CBC
HCT: 36.1 % (ref 36.0–46.0)
Hemoglobin: 11.3 g/dL — ABNORMAL LOW (ref 12.0–15.0)
MCH: 27 pg (ref 26.0–34.0)
MCHC: 31.3 g/dL (ref 30.0–36.0)
RBC: 4.18 MIL/uL (ref 3.87–5.11)

## 2012-11-03 LAB — PROTIME-INR: INR: 2.46 — ABNORMAL HIGH (ref 0.00–1.49)

## 2012-11-03 MED ORDER — TIOTROPIUM BROMIDE MONOHYDRATE 18 MCG IN CAPS
18.0000 ug | ORAL_CAPSULE | RESPIRATORY_TRACT | Status: DC
  Administered 2012-11-06 – 2012-11-07 (×2): 18 ug via RESPIRATORY_TRACT
  Filled 2012-11-03 (×2): qty 5

## 2012-11-03 MED ORDER — LINAGLIPTIN 5 MG PO TABS
5.0000 mg | ORAL_TABLET | Freq: Every day | ORAL | Status: DC
  Administered 2012-11-03 – 2012-11-04 (×2): 5 mg via ORAL
  Filled 2012-11-03 (×2): qty 1

## 2012-11-03 MED ORDER — CALCIUM CARBONATE-VITAMIN D 500-200 MG-UNIT PO TABS
1.0000 | ORAL_TABLET | Freq: Two times a day (BID) | ORAL | Status: DC
  Administered 2012-11-03 – 2012-11-08 (×10): 1 via ORAL
  Filled 2012-11-03 (×11): qty 1

## 2012-11-03 MED ORDER — INSULIN GLARGINE 100 UNIT/ML ~~LOC~~ SOLN
30.0000 [IU] | Freq: Every day | SUBCUTANEOUS | Status: DC
  Administered 2012-11-03 – 2012-11-07 (×5): 30 [IU] via SUBCUTANEOUS

## 2012-11-03 MED ORDER — GLYBURIDE 5 MG PO TABS
5.0000 mg | ORAL_TABLET | Freq: Two times a day (BID) | ORAL | Status: DC
  Administered 2012-11-04 – 2012-11-05 (×3): 5 mg via ORAL
  Filled 2012-11-03 (×5): qty 1

## 2012-11-03 MED ORDER — POLYSACCHARIDE IRON COMPLEX 150 MG PO CAPS
150.0000 mg | ORAL_CAPSULE | Freq: Two times a day (BID) | ORAL | Status: DC
  Administered 2012-11-03 – 2012-11-08 (×10): 150 mg via ORAL
  Filled 2012-11-03 (×11): qty 1

## 2012-11-03 MED ORDER — METOPROLOL TARTRATE 25 MG PO TABS
25.0000 mg | ORAL_TABLET | Freq: Two times a day (BID) | ORAL | Status: DC
  Administered 2012-11-03 – 2012-11-05 (×4): 25 mg via ORAL
  Filled 2012-11-03 (×5): qty 1

## 2012-11-03 MED ORDER — WARFARIN SODIUM 2.5 MG PO TABS
2.5000 mg | ORAL_TABLET | ORAL | Status: AC
  Administered 2012-11-04: 2.5 mg via ORAL
  Filled 2012-11-03: qty 1

## 2012-11-03 MED ORDER — AMIODARONE HCL 200 MG PO TABS
300.0000 mg | ORAL_TABLET | Freq: Every day | ORAL | Status: DC
  Administered 2012-11-03 – 2012-11-04 (×2): 300 mg via ORAL
  Filled 2012-11-03 (×2): qty 1

## 2012-11-03 MED ORDER — WARFARIN SODIUM 5 MG PO TABS
5.0000 mg | ORAL_TABLET | Freq: Once | ORAL | Status: AC
  Administered 2012-11-03: 5 mg via ORAL
  Filled 2012-11-03: qty 1

## 2012-11-03 MED ORDER — ALBUTEROL SULFATE HFA 108 (90 BASE) MCG/ACT IN AERS
2.0000 | INHALATION_SPRAY | Freq: Four times a day (QID) | RESPIRATORY_TRACT | Status: DC | PRN
  Filled 2012-11-03: qty 6.7

## 2012-11-03 MED ORDER — INSULIN ASPART 100 UNIT/ML ~~LOC~~ SOLN
0.0000 [IU] | Freq: Three times a day (TID) | SUBCUTANEOUS | Status: DC
  Administered 2012-11-04: 7 [IU] via SUBCUTANEOUS
  Administered 2012-11-04 – 2012-11-05 (×2): 4 [IU] via SUBCUTANEOUS
  Administered 2012-11-05 (×2): 3 [IU] via SUBCUTANEOUS
  Administered 2012-11-06: 4 [IU] via SUBCUTANEOUS
  Administered 2012-11-06 (×2): 3 [IU] via SUBCUTANEOUS
  Administered 2012-11-07 (×2): 4 [IU] via SUBCUTANEOUS
  Administered 2012-11-07: 3 [IU] via SUBCUTANEOUS
  Administered 2012-11-08 (×2): 4 [IU] via SUBCUTANEOUS

## 2012-11-03 MED ORDER — ZOLPIDEM TARTRATE 5 MG PO TABS
5.0000 mg | ORAL_TABLET | Freq: Every evening | ORAL | Status: DC | PRN
  Administered 2012-11-04 – 2012-11-07 (×5): 5 mg via ORAL
  Filled 2012-11-03 (×5): qty 1

## 2012-11-03 MED ORDER — LEVOTHYROXINE SODIUM 125 MCG PO TABS
62.5000 ug | ORAL_TABLET | Freq: Every day | ORAL | Status: DC
  Administered 2012-11-04 – 2012-11-05 (×2): 62.5 ug via ORAL
  Filled 2012-11-03 (×3): qty 0.5

## 2012-11-03 MED ORDER — NITROGLYCERIN 0.4 MG/HR TD PT24
0.4000 mg | MEDICATED_PATCH | TRANSDERMAL | Status: DC | PRN
  Filled 2012-11-03: qty 1

## 2012-11-03 MED ORDER — POTASSIUM CHLORIDE CRYS ER 20 MEQ PO TBCR
20.0000 meq | EXTENDED_RELEASE_TABLET | Freq: Two times a day (BID) | ORAL | Status: DC
  Administered 2012-11-03 – 2012-11-04 (×2): 20 meq via ORAL
  Filled 2012-11-03 (×3): qty 1

## 2012-11-03 MED ORDER — WARFARIN SODIUM 5 MG PO TABS
5.0000 mg | ORAL_TABLET | ORAL | Status: DC
  Administered 2012-11-05 – 2012-11-07 (×2): 5 mg via ORAL
  Filled 2012-11-03 (×2): qty 1

## 2012-11-03 MED ORDER — CALCITRIOL 0.25 MCG PO CAPS
0.2500 ug | ORAL_CAPSULE | Freq: Two times a day (BID) | ORAL | Status: DC
  Administered 2012-11-03 – 2012-11-08 (×10): 0.25 ug via ORAL
  Filled 2012-11-03 (×13): qty 1

## 2012-11-03 MED ORDER — CALCIUM-MAGNESIUM-VITAMIN D ER 600-40-500 MG-MG-UNIT PO TB24: 1.0000 | ORAL_TABLET | Freq: Two times a day (BID) | ORAL | Status: DC

## 2012-11-03 MED ORDER — WARFARIN - PHARMACIST DOSING INPATIENT
Freq: Every day | Status: DC
  Administered 2012-11-04 – 2012-11-07 (×3)

## 2012-11-03 NOTE — Progress Notes (Signed)
ANTICOAGULATION CONSULT NOTE - Initial Consult  Pharmacy Consult for coumadin Indication: atrial fibrillation  Allergies  Allergen Reactions  . Ivp Dye (Iodinated Diagnostic Agents) Shortness Of Breath    Turn red, can't breathe  . Diphenhydramine Hcl Swelling    In hands and eyes  . Fish Allergy Nausea And Vomiting  . Iohexol      Desc: PT TURNS RED AND WHEEZING   . Penicillins Other (See Comments)    Resp arrest as child  . Povidone Other (See Comments)    Wheezing, turn red, can't breath, and blisters  . Promethazine Hcl Other (See Comments)    Low Blood Pressure  . Tape     Blisters, can use paper tape for short periods  . Clindamycin Hives and Rash    wheezing  . Iodine Rash  . Morphine Sulfate Nausea And Vomiting and Rash    Patient Measurements: Height: 5\' 7"  (170.2 cm) Weight: 356 lb 0.7 oz (161.5 kg) IBW/kg (Calculated) : 61.6    Vital Signs: Temp: 98.2 F (36.8 C) (11/04 1854) Temp src: Oral (11/04 1854) BP: 118/66 mmHg (11/04 1854) Pulse Rate: 71  (11/04 1854)  Labs:  [facility] 11/03/12 2036  HGB 11.3*  HCT 36.1  PLT 155  APTT --  LABPROT 25.5*  INR 2.46*  HEPARINUNFRC --  CREATININE 1.21*  CKTOTAL --  CKMB --  TROPONINI --    Estimated Creatinine Clearance: 75.3 ml/min (by C-G formula based on Cr of 1.21).   Medical History: Past Medical History  Diagnosis Date  . Personal history of other diseases of circulatory system   . Cardiac pacemaker in situ   . Chronic lymphocytic thyroiditis   . Morbid obesity   . Other chronic pulmonary heart diseases   . Obstructive sleep apnea (adult) (pediatric)   . Other and unspecified hyperlipidemia   . Fibromyalgia   . Goiter   . Thyroiditis, autoimmune   . Hypothyroidism, acquired, autoimmune   . Solitary kidney, acquired   . Fatigue   . Hyperparathyroidism, secondary   . Vitamin D deficiency disease   . Hyperparathyroidism   . H/O varicose veins 03/27/12  . Type II or unspecified type  diabetes mellitus without mention of complication, not stated as uncontrolled   . Diabetes mellitus type II   . Infections of kidney 03/27/12  . History of measles, mumps, or rubella 03/27/12  . Tubal pregnancy 12/23/76  . Unspecified essential hypertension     20 yrs ago  . Blood transfusion 1977    [facility]  . Complication of anesthesia 2010    hard to wake up after knee surgery  . Anemia   . Cardiomyopathy, nonischemic   . Endometrial hyperplasia with atypia 05/05/12    Medications:  Prescriptions prior to admission  Medication Sig Dispense Refill  . albuterol (PROVENTIL HFA;VENTOLIN HFA) 108 (90 BASE) MCG/ACT inhaler Inhale 2 puffs into the lungs every 6 (six) hours as needed. For shortness of breath      . albuterol (PROVENTIL) (2.5 MG/3ML) 0.083% nebulizer solution Take 2.5 mg by nebulization every 6 (six) hours as needed. For shortness of breath      . ALPRAZolam (XANAX) 0.5 MG tablet Take 0.25 mg by mouth 3 (three) times daily as needed. For anxiety      . amiodarone (CORDARONE) 200 MG tablet Take 300 mg by mouth daily. Takes 1 and 1/2 tablet daily.      . calcitRIOL (ROCALTROL) 0.25 MCG capsule Take 0.25 mcg by mouth 2 (two)  times daily.      . calcium citrate-vitamin D 200-200 MG-UNIT TABS Take 1 tablet by mouth 4 (four) times daily -  before meals and at bedtime.      . digoxin (LANOXIN) 0.25 MG tablet Take 250 mcg by mouth daily.       . DULoxetine (CYMBALTA) 30 MG capsule Take 30 mg by mouth 2 (two) times daily as needed. For pain      . ergocalciferol (VITAMIN D2) 50000 UNITS capsule Take 50,000 Units by mouth 2 (two) times a week. On Tues and Fri      . glyBURIDE (DIABETA) 5 MG tablet Take 5-10 mg by mouth daily with breakfast. Take 10mg  in AM and 5mg  in PM      . HYDROcodone-acetaminophen (LORCET) 10-650 MG per tablet Take 1 tablet by mouth every 8 (eight) hours as needed. For pain      . insulin glargine (LANTUS) 100 UNIT/ML injection Inject 30 Units into the skin at bedtime.  Increase dose by one unit every 4 days until most AM BGs are in the 80-120 range.      . iron polysaccharides (NIFEREX) 150 MG capsule Take 1 capsule (150 mg total) by mouth 2 (two) times daily.  30 capsule  3  . levothyroxine (SYNTHROID, LEVOTHROID) 125 MCG tablet Take 62.5 mcg by mouth See admin instructions. Alternate with daily      . levothyroxine (SYNTHROID, LEVOTHROID) 75 MCG tablet Take 75 mcg by mouth daily. Alternate with 62.5 mcg dose daily      . metolazone (ZAROXOLYN) 5 MG tablet Take 10 mg by mouth daily. For edema      . metoprolol tartrate (LOPRESSOR) 25 MG tablet Take 25 mg by mouth 2 (two) times daily.       . Multiple Vitamin (MULTIVITAMIN) tablet Take 1 tablet by mouth daily.       . mupirocin ointment (BACTROBAN) 2 % Apply 1 application topically 2 (two) times daily. To leg blister      . nitroGLYCERIN (NITRODUR - DOSED IN MG/24 HR) 0.4 mg/hr Place 1 patch onto the skin every other day.      . nitroGLYCERIN (NITROSTAT) 0.4 MG SL tablet Place 0.4 mg under the tongue every 5 (five) minutes as needed. For chest pain      . potassium chloride SA (K-DUR,KLOR-CON) 20 MEQ tablet Take 40 mEq by mouth daily.      . sitaGLIPtin (JANUVIA) 100 MG tablet Take 100 mg by mouth daily.      Marland Kitchen tiotropium (SPIRIVA) 18 MCG inhalation capsule Place 18 mcg into inhaler and inhale every other day.       . warfarin (COUMADIN) 5 MG tablet Take 5 mg by mouth daily. 5 mg on even days. 2.5 mg on odd days Pacemaker- complete heart block and AF      . zolpidem (AMBIEN) 10 MG tablet Take 10 mg by mouth at bedtime as needed. sleep      . calcitRIOL (ROCALTROL) 0.25 MCG capsule Take 1 capsule (0.25 mcg total) by mouth 2 (two) times daily.  60 capsule  6    Assessment: 62 yo lady to continue coumadin for afib.  Her admit INR is therapeutic on home regimen of 5 mg on even days and 2.5 mg on odd days. Goal of Therapy:  INR 2-3 Monitor platelets by anticoagulation protocol: Yes   Plan:  Continue  coumadin home dose. Check daily protimes.  Monitor for s&s bleeding.  Talbert Cage Poteet 11/03/2012,9:28  PM   

## 2012-11-03 NOTE — H&P (Signed)
[name] is an 62 y.o. female.   Chief Complaint:   HPI:   The patient is a 62 year old morbidly obese female with a history of chronic lower extremity edema, pacemaker implant for tachybradycardia syndrome, obstructive sleep apnea with CPAP, pulmonary hypertension, diabetes mellitus, hypothyroidism, SVT and PAF for which he takes amiodarone and Coumadin.  Patient's last cardiac catheterization was in 2008 and revealed normal coronary arteries No evidence of coronary artery disease. No renal artery stenosis. Left ventricular dysfunction with ejection fraction of 46% and apical akinetic segment.  Last 2-D echocardiogram was July 2013 showed an ejection fraction of 40-45% with mild global hypokinesis of the left ventricle.  Right ventricle systolic pressure was 50-60 mm of mercury.  At her last office visit with Dr. Caprice Kluver patient's weight was 340 pounds which is down 18 pounds from her previous visit in July. Her current weight in our office is approximately 287 pounds.   Medications:  Lanoxin 0.25 mg daily, potassium 20 mEq 2 times a day, nitroglycerin patch 0.4 mg, amiodarone 200 mg daily, Synthroid 62.5 mcg alternating with 75 mcg daily, metoprolol 25 mg twice daily number next Januvia 100, Coumadin 5 mg tablets, glyburide 5 mg 2 to the morning one in the evening, Lantus 30 units daily, Provera inhaler every other day, Spiriva every other day, Zaroxolyn 10 mg each morning, oxygen intermittently, CPAP with oxygen at night, Ambien 10 mg at bedtime, calcitriol 0.25 mg  Allergies: Contrast media, tape, clindamycin, penicillin, Phenergan, morphine, Betadine, metformin, and Benadryl  Past Medical History  Diagnosis Date  . Personal history of other diseases of circulatory system   . Cardiac pacemaker in situ   . Chronic lymphocytic thyroiditis   . Morbid obesity   . Other chronic pulmonary heart diseases   . Obstructive sleep apnea (adult) (pediatric)   . Other and unspecified  hyperlipidemia   . Fibromyalgia   . Goiter   . Thyroiditis, autoimmune   . Hypothyroidism, acquired, autoimmune   . Solitary kidney, acquired   . Fatigue   . Hyperparathyroidism, secondary   . Vitamin D deficiency disease   . Hyperparathyroidism   . H/O varicose veins 03/27/12  . Type II or unspecified type diabetes mellitus without mention of complication, not stated as uncontrolled   . Diabetes mellitus type II   . Infections of kidney 03/27/12  . History of measles, mumps, or rubella 03/27/12  . Tubal pregnancy 12/23/76  . Unspecified essential hypertension     20 yrs ago  . Blood transfusion 1977    [facility]  . Complication of anesthesia 2010    hard to wake up after knee surgery  . Anemia   . Cardiomyopathy, nonischemic   . Endometrial hyperplasia with atypia 05/05/12    Past Surgical History  Procedure Date  . Cholecystectomy   . Pacemaker placement   . Knee surgery   . Resection duplicate left kidney   . 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 Hernia repair     ruptured ;  . D& c with hysteroscopy & cervical polypectomy 05/05/12    Family History  Problem Relation Age of Onset  . Cardiomyopathy Brother   . Diabetes Mother   . Cancer Maternal Grandmother    Social History:  reports that she has never smoked. She has never used smokeless tobacco. She reports that she does not drink alcohol or use illicit drugs.  Allergies:  Allergies  Allergen Reactions  . Ivp Dye (Iodinated Diagnostic Agents)  Shortness Of Breath    Turn red, can't breathe  . Diphenhydramine Hcl Swelling    In hands and eyes  . Fish Allergy   . Iodine   . Iohexol      Desc: PT TURNS RED AND WHEEZING   . Penicillins Other (See Comments)    Resp arrest as child  . Povidone Other (See Comments)    Wheezing, turn red, can't breath, and blisters  . Promethazine Hcl Other (See Comments)    Low Blood Pressure  . Tape     Blisters, can use paper tape for short periods  . Clindamycin  Rash    wheezing  . Morphine Sulfate Nausea And Vomiting and Rash    Medications Prior to Admission  Medication Sig Dispense Refill  . albuterol (PROAIR HFA) 108 (90 BASE) MCG/ACT inhaler Inhale 2 puffs into the lungs every 6 (six) hours as needed.        . ALPRAZolam (XANAX) 0.5 MG tablet as needed.       Marland Kitchen amiodarone (CORDARONE) 200 MG tablet Take 300 mg by mouth daily. Takes 1 and 1/2 tablet daily.      . calcitRIOL (ROCALTROL) 0.25 MCG capsule Take 1 capsule (0.25 mcg total) by mouth 2 (two) times daily.  60 capsule  6  . Calcium-Magnesium-Vitamin D (CITRACAL CALCIUM+D) 600-40-500 MG-MG-UNIT TB24 Take 1 tablet by mouth 2 (two) times daily.       . digoxin (LANOXIN) 0.25 MG tablet Take 250 mcg by mouth daily.        . DULoxetine (CYMBALTA) 30 MG capsule Take 30 mg by mouth daily. Occasionally takes twice daily if in pain from fibromyalgia.      Marland Kitchen ergocalciferol (VITAMIN D2) 50000 UNITS capsule Take 1 tablet (50,000 units) twice weekly-- Tuesday and Friday.  8 capsule  5  . glyBURIDE (DIABETA) 5 MG tablet 2 tablets in the am and 1 at supper      . HYDROcodone-acetaminophen (LORCET 10/650) 10-650 MG per tablet Take 1 tablet by mouth every 8 (eight) hours as needed for pain.  90 tablet  5  . insulin glargine (LANTUS) 100 UNIT/ML injection Inject 30 Units into the skin at bedtime. Increase dose by one unit every 4 days until most AM BGs are in the 80-120 range.      . iron polysaccharides (NIFEREX) 150 MG capsule Take 1 capsule (150 mg total) by mouth 2 (two) times daily.  30 capsule  3  . levothyroxine (SYNTHROID, LEVOTHROID) 25 MCG tablet Take  62.5 mcg on odd days and 75 mcg on even days      . metolazone (ZAROXOLYN) 5 MG tablet Take 10 mg by mouth daily. For edema      . metoprolol tartrate (LOPRESSOR) 25 MG tablet Take 25 mg by mouth 2 (two) times daily.        . Multiple Vitamin (MULTIVITAMIN) tablet Take 1 tablet by mouth daily.        . mupirocin ointment (BACTROBAN) 2 % Apply 1  application topically 2 (two) times daily. To leg blister       . nitroGLYCERIN (NITRO-DUR) 0.4 mg/hr Patch on at night and off in am. 1 patch every other day.      . ONE TOUCH ULTRA TEST test strip       . potassium chloride SA (K-DUR,KLOR-CON) 20 MEQ tablet Take 20 mEq by mouth 2 (two) times daily.       . sitaGLIPtin (JANUVIA) 100 MG tablet Take 1 tablet  daily.  30 tablet  5  . tiotropium (SPIRIVA) 18 MCG inhalation capsule Place 18 mcg into inhaler and inhale every other day.       . warfarin (COUMADIN) 5 MG tablet Take 5 mg by mouth daily. 5 mg on even days. 2.5 mg on odd days Pacemaker- complete heart block and AF      . zolpidem (AMBIEN) 10 MG tablet Take 10 mg by mouth at bedtime as needed. sleep        No results found for this or any previous visit (from the past 48 hour(s)). No results found.  ROS  There were no vitals taken for this visit. Physical Exam  Constitutional: She is oriented to person, place, and time. No distress.       Morbidly obese  HENT:  Head: Normocephalic and atraumatic.  Eyes: EOM are normal. Pupils are equal, round, and reactive to light. No scleral icterus.  Cardiovascular: Normal rate and regular rhythm.   No murmur heard. Respiratory: Effort normal and breath sounds normal. No respiratory distress. She has no wheezes. She has no rales.  Musculoskeletal: She exhibits edema.  Neurological: She is alert and oriented to person, place, and time.  Skin: Skin is warm and dry.  Psychiatric: She has a normal mood and affect.     Assessment/Plan Patient Active Hospital Problem List: Dehydration (11/03/2012) OBESITY, MORBID (05/17/2008) OBSTRUCTIVE SLEEP APNEA (05/17/2008) HYPERTENSION (05/17/2008) HYPERTENSION, PULMONARY (05/17/2008) ATRIAL FIBRILLATION, HX OF (05/17/2008) PACEMAKER, PERMANENT (05/17/2008) Obesity hypoventilation syndrome (09/09/2011) Thyroiditis, autoimmune () Cardiomyopathy, nonischemic (05/02/2012) Type 2 diabetes mellitus  (10/15/2012)  Plan:  Will hold digoxin and diuretics.  Draw BMET and CBC.  Monitor on telemetry.  Hospital wt is 356#.   Callaghan Laverdure 11/03/2012, 6:45 PM

## 2012-11-04 ENCOUNTER — Encounter (HOSPITAL_COMMUNITY): Payer: Self-pay | Admitting: *Deleted

## 2012-11-04 ENCOUNTER — Observation Stay (HOSPITAL_COMMUNITY): Payer: Medicare Other

## 2012-11-04 LAB — GLUCOSE, CAPILLARY
Glucose-Capillary: 186 mg/dL — ABNORMAL HIGH (ref 70–99)
Glucose-Capillary: 185 mg/dL — ABNORMAL HIGH (ref 70–99)
Glucose-Capillary: 212 mg/dL — ABNORMAL HIGH (ref 70–99)
Glucose-Capillary: 146 mg/dL — ABNORMAL HIGH (ref 70–99)

## 2012-11-04 LAB — BASIC METABOLIC PANEL
BUN: 27 mg/dL — ABNORMAL HIGH (ref 6–23)
CO2: 32 mEq/L (ref 19–32)
Chloride: 96 mEq/L (ref 96–112)
Creatinine, Ser: 1.27 mg/dL — ABNORMAL HIGH (ref 0.50–1.10)
Potassium: 4 mEq/L (ref 3.5–5.1)

## 2012-11-04 LAB — PRO B NATRIURETIC PEPTIDE: Pro B Natriuretic peptide (BNP): 8028 pg/mL — ABNORMAL HIGH (ref 0–125)

## 2012-11-04 MED ORDER — AMIODARONE HCL 200 MG PO TABS: 300.0000 mg | ORAL_TABLET | Freq: Every day | ORAL | Status: DC

## 2012-11-04 MED ORDER — DIGOXIN 250 MCG PO TABS: 0.2500 mg | ORAL_TABLET | Freq: Every day | ORAL | Status: DC

## 2012-11-04 MED ORDER — AMIODARONE HCL 200 MG PO TABS
300.0000 mg | ORAL_TABLET | Freq: Every day | ORAL | Status: DC
  Administered 2012-11-04: 300 mg via ORAL
  Filled 2012-11-04: qty 1

## 2012-11-04 MED ORDER — POTASSIUM CHLORIDE CRYS ER 20 MEQ PO TBCR
20.0000 meq | EXTENDED_RELEASE_TABLET | Freq: Two times a day (BID) | ORAL | Status: DC
  Administered 2012-11-04 – 2012-11-08 (×8): 20 meq via ORAL
  Filled 2012-11-04 (×9): qty 1

## 2012-11-04 MED ORDER — DIGOXIN 125 MCG PO TABS
0.1250 mg | ORAL_TABLET | Freq: Every day | ORAL | Status: DC
  Administered 2012-11-04 – 2012-11-05 (×2): 0.125 mg via ORAL
  Filled 2012-11-04 (×2): qty 1

## 2012-11-04 MED ORDER — HYDROCODONE-ACETAMINOPHEN 10-325 MG PO TABS
1.0000 | ORAL_TABLET | Freq: Two times a day (BID) | ORAL | Status: DC
  Administered 2012-11-04 – 2012-11-08 (×9): 1 via ORAL
  Filled 2012-11-04 (×9): qty 1

## 2012-11-04 MED ORDER — FUROSEMIDE 10 MG/ML IJ SOLN
40.0000 mg | Freq: Two times a day (BID) | INTRAMUSCULAR | Status: DC
  Administered 2012-11-04 – 2012-11-06 (×4): 40 mg via INTRAVENOUS
  Filled 2012-11-04 (×6): qty 4

## 2012-11-04 MED ORDER — LINAGLIPTIN 5 MG PO TABS
5.0000 mg | ORAL_TABLET | Freq: Every day | ORAL | Status: DC
  Administered 2012-11-05 – 2012-11-08 (×4): 5 mg via ORAL
  Filled 2012-11-04 (×5): qty 1

## 2012-11-04 NOTE — Progress Notes (Signed)
ANTICOAGULATION CONSULT NOTE   Pharmacy Consult for coumadin Indication: atrial fibrillation  Allergies  Allergen Reactions  . Ivp Dye (Iodinated Diagnostic Agents) Shortness Of Breath    Turn red, can't breathe  . Diphenhydramine Hcl Swelling    In hands and eyes  . Fish Allergy Nausea And Vomiting  . Iohexol      Desc: PT TURNS RED AND WHEEZING   . Penicillins Other (See Comments)    Resp arrest as child  . Povidone Other (See Comments)    Wheezing, turn red, can't breath, and blisters  . Promethazine Hcl Other (See Comments)    Low Blood Pressure  . Tape     Blisters, can use paper tape for short periods  . Clindamycin Hives and Rash    wheezing  . Iodine Rash  . Morphine Sulfate Nausea And Vomiting and Rash    Patient Measurements: Height: 5\' 7"  (170.2 cm) Weight: 356 lb 0.7 oz (161.5 kg) IBW/kg (Calculated) : 61.6    Vital Signs: Temp: 98.4 F (36.9 C) (11/05 0705) Temp src: Oral (11/05 0705) BP: 105/65 mmHg (11/05 1119) Pulse Rate: 74  (11/05 1119)  Labs:  Basename 11/04/12 1026 11/04/12 0605 11/03/12 2036  HGB -- -- 11.3*  HCT -- -- 36.1  PLT -- -- 155  APTT -- -- --  LABPROT 23.9* -- 25.5*  INR 2.25* -- 2.46*  HEPARINUNFRC -- -- --  CREATININE -- 1.27* 1.21*  CKTOTAL -- -- --  CKMB -- -- --  TROPONINI -- -- --    Estimated Creatinine Clearance: 71.8 ml/min (by C-G formula based on Cr of 1.27).   Medical History: Past Medical History  Diagnosis Date  . Personal history of other diseases of circulatory system   . Cardiac pacemaker in situ   . Chronic lymphocytic thyroiditis   . Morbid obesity   . Other chronic pulmonary heart diseases   . Obstructive sleep apnea (adult) (pediatric)   . Other and unspecified hyperlipidemia   . Fibromyalgia   . Goiter   . Thyroiditis, autoimmune   . Hypothyroidism, acquired, autoimmune   . Solitary kidney, acquired   . Fatigue   . Hyperparathyroidism, secondary   . Vitamin D deficiency disease   .  Hyperparathyroidism   . H/O varicose veins 03/27/12  . Type II or unspecified type diabetes mellitus without mention of complication, not stated as uncontrolled   . Diabetes mellitus type II   . Infections of kidney 03/27/12  . History of measles, mumps, or rubella 03/27/12  . Tubal pregnancy 12/23/76  . Unspecified essential hypertension     20 yrs ago  . Blood transfusion 1977    [facility]  . Complication of anesthesia 2010    hard to wake up after knee surgery  . Anemia   . Cardiomyopathy, nonischemic   . Endometrial hyperplasia with atypia 05/05/12    Medications:  Prescriptions prior to admission  Medication Sig Dispense Refill  . albuterol (PROVENTIL HFA;VENTOLIN HFA) 108 (90 BASE) MCG/ACT inhaler Inhale 2 puffs into the lungs every 6 (six) hours as needed. For shortness of breath      . albuterol (PROVENTIL) (2.5 MG/3ML) 0.083% nebulizer solution Take 2.5 mg by nebulization every 6 (six) hours as needed. For shortness of breath      . ALPRAZolam (XANAX) 0.5 MG tablet Take 0.25 mg by mouth 3 (three) times daily as needed. For anxiety      . amiodarone (CORDARONE) 200 MG tablet Take 300 mg by mouth daily.  Takes 1 and 1/2 tablet daily.      . calcitRIOL (ROCALTROL) 0.25 MCG capsule Take 0.25 mcg by mouth 2 (two) times daily.      . calcium citrate-vitamin D 200-200 MG-UNIT TABS Take 1 tablet by mouth 4 (four) times daily -  before meals and at bedtime.      . digoxin (LANOXIN) 0.25 MG tablet Take 250 mcg by mouth daily.       . DULoxetine (CYMBALTA) 30 MG capsule Take 30 mg by mouth 2 (two) times daily as needed. For pain      . ergocalciferol (VITAMIN D2) 50000 UNITS capsule Take 50,000 Units by mouth 2 (two) times a week. On Tues and Fri      . glyBURIDE (DIABETA) 5 MG tablet Take 5-10 mg by mouth daily with breakfast. Take 10mg  in AM and 5mg  in PM      . HYDROcodone-acetaminophen (LORCET) 10-650 MG per tablet Take 1 tablet by mouth every 8 (eight) hours as needed. For pain      . insulin  glargine (LANTUS) 100 UNIT/ML injection Inject 30 Units into the skin at bedtime. Increase dose by one unit every 4 days until most AM BGs are in the 80-120 range.      . iron polysaccharides (NIFEREX) 150 MG capsule Take 1 capsule (150 mg total) by mouth 2 (two) times daily.  30 capsule  3  . levothyroxine (SYNTHROID, LEVOTHROID) 125 MCG tablet Take 62.5 mcg by mouth See admin instructions. Alternate with daily      . levothyroxine (SYNTHROID, LEVOTHROID) 75 MCG tablet Take 75 mcg by mouth daily. Alternate with 62.5 mcg dose daily      . metolazone (ZAROXOLYN) 5 MG tablet Take 10 mg by mouth daily. For edema      . metoprolol tartrate (LOPRESSOR) 25 MG tablet Take 25 mg by mouth 2 (two) times daily.       . Multiple Vitamin (MULTIVITAMIN) tablet Take 1 tablet by mouth daily.       . mupirocin ointment (BACTROBAN) 2 % Apply 1 application topically 2 (two) times daily. To leg blister      . nitroGLYCERIN (NITRODUR - DOSED IN MG/24 HR) 0.4 mg/hr Place 1 patch onto the skin every other day.      . nitroGLYCERIN (NITROSTAT) 0.4 MG SL tablet Place 0.4 mg under the tongue every 5 (five) minutes as needed. For chest pain      . potassium chloride SA (K-DUR,KLOR-CON) 20 MEQ tablet Take 40 mEq by mouth daily.      . sitaGLIPtin (JANUVIA) 100 MG tablet Take 100 mg by mouth daily.      Marland Kitchen tiotropium (SPIRIVA) 18 MCG inhalation capsule Place 18 mcg into inhaler and inhale every other day.       . warfarin (COUMADIN) 5 MG tablet Take 5 mg by mouth daily. 5 mg on even days. 2.5 mg on odd days Pacemaker- complete heart block and AF      . zolpidem (AMBIEN) 10 MG tablet Take 10 mg by mouth at bedtime as needed. sleep      . calcitRIOL (ROCALTROL) 0.25 MCG capsule Take 1 capsule (0.25 mcg total) by mouth 2 (two) times daily.  60 capsule  6    Assessment: 62 yo lady to continue coumadin for afib.  Her admit INR is therapeutic on home regimen of 5 mg on even days and 2.5 mg on odd days.  INR today 2.25 Goal  of Therapy:  INR  2-3 Monitor platelets by anticoagulation protocol: Yes   Plan:  Continue coumadin home dose. Check daily protimes.  Monitor for s&s bleeding.  Talbert Cage Poteet 11/04/2012,12:05 PM

## 2012-11-04 NOTE — Progress Notes (Signed)
Patient c/o pain 10/10 to legs described as chronic pain.  Harlow Mares, PA notified and Norco ordered x 1 per home med.  Patient in no acute distress and RN will continue to monitor. Louretta Parma, RN

## 2012-11-04 NOTE — Progress Notes (Signed)
[facility]  Subjective: DOE  Objective: Vital signs in last 24 hours: Temp:  [98.2 F (36.8 C)-98.5 F (36.9 C)] 98.4 F (36.9 C) (11/05 0705) Pulse Rate:  [71-74] 71  (11/05 0705) Resp:  [18-20] 18  (11/05 0705) BP: (118-126)/(64-69) 123/64 mmHg (11/05 0705) SpO2:  [97 %-98 %] 97 % (11/05 0705) Weight:  [161.5 kg (356 lb 0.7 oz)] 161.5 kg (356 lb 0.7 oz) (11/04 1700) Last BM Date: 11/03/12  Intake/Output from previous day: 11/04 0701 - 11/05 0700 In: 720 [P.O.:720] Out: 450 [Urine:450] Intake/Output this shift:    Medications Current Facility-Administered Medications  Medication Dose Route Frequency Provider Last Rate Last Dose  . albuterol (PROVENTIL HFA;VENTOLIN HFA) 108 (90 BASE) MCG/ACT inhaler 2 puff  2 puff Inhalation Q6H PRN Wilburt Finlay, PA      . amiodarone (PACERONE) tablet 300 mg  300 mg Oral Daily Wilburt Finlay, PA   300 mg at 11/03/12 2237  . calcitRIOL (ROCALTROL) capsule 0.25 mcg  0.25 mcg Oral BID Wilburt Finlay, PA   0.25 mcg at 11/03/12 2231  . calcium-vitamin D (OSCAL WITH D) 500-200 MG-UNIT per tablet 1 tablet  1 tablet Oral BID Thurmon Fair, MD   1 tablet at 11/03/12 2231  . glyBURIDE (DIABETA) tablet 5 mg  5 mg Oral BID WC Wilburt Finlay, PA   5 mg at 11/04/12 1610  . HYDROcodone-acetaminophen (NORCO) 10-325 MG per tablet 1 tablet  1 tablet Oral BID Amy L. Janee Morn, [place]   1 tablet at 11/04/12 0424  . insulin aspart (novoLOG) injection 0-20 Units  0-20 Units Subcutaneous TID WC Wilburt Finlay, PA   4 Units at 11/04/12 [phone]  . insulin glargine (LANTUS) injection 30 Units  30 Units Subcutaneous QHS Wilburt Finlay, PA   30 Units at 11/03/12 2242  . iron polysaccharides (NIFEREX) capsule 150 mg  150 mg Oral BID Wilburt Finlay, PA   150 mg at 11/03/12 2230  . levothyroxine (SYNTHROID, LEVOTHROID) tablet 62.5 mcg  62.5 mcg Oral QAC breakfast Wilburt Finlay, PA   62.5 mcg at 11/04/12 5409  . linagliptin (TRADJENTA) tablet 5 mg  5 mg Oral Daily Wilburt Finlay, PA   5 mg at 11/03/12 2231  . metoprolol tartrate (LOPRESSOR) tablet 25 mg  25 mg Oral BID Wilburt Finlay, PA   25 mg at 11/03/12 2240  . nitroGLYCERIN (NITRODUR - Dosed in mg/24 hr) patch 0.4 mg  0.4 mg Transdermal PRN Wilburt Finlay, PA      . potassium chloride SA (K-DUR,KLOR-CON) CR tablet 20 mEq  20 mEq Oral BID Wilburt Finlay, PA   20 mEq at 11/03/12 2241  . tiotropium (SPIRIVA) inhalation capsule 18 mcg  18 mcg Inhalation QODAY Wilburt Finlay, PA      . warfarin (COUMADIN) tablet 2.5 mg  2.5 mg Oral Q48H Mihai Croitoru, MD      . warfarin (COUMADIN) tablet 5 mg  5 mg Oral Q48H Mihai Croitoru, MD      . [COMPLETED] warfarin (COUMADIN) tablet 5 mg  5 mg Oral Once Thurmon Fair, MD   5 mg at 11/03/12 2234  . Warfarin - Pharmacist Dosing Inpatient   Does not apply q1800 Mihai Croitoru, MD      . zolpidem (AMBIEN) tablet 5 mg  5 mg Oral QHS PRN Wilburt Finlay, PA   5 mg at 11/04/12 0041  . [DISCONTINUED] Calcium-Magnesium-Vitamin D 600-40-500 MG-MG-UNIT TB24 1 tablet  1 tablet Oral BID Wilburt Finlay, PA  PE: General appearance: alert, cooperative and no distress Lungs: clear to auscultation bilaterally Heart: regular rate and rhythm, S1, S2 normal, no murmur, click, rub or gallop Extremities: 1-2+ LEE.   Pulses: 2+ and symmetric Neurologic: Grossly normal  Lab Results:   Basename 11/03/12 2036  WBC 6.2  HGB 11.3*  HCT 36.1  PLT 155   BMET  Basename 11/04/12 0605 11/03/12 2036  NA 136 139  K 4.0 3.8  CL 96 100  CO2 32 31  GLUCOSE 193* 201*  BUN 27* 26*  CREATININE 1.27* 1.21*  CALCIUM 9.4 9.4   PT/INR  Basename 11/03/12 2036  LABPROT 25.5*  INR 2.46*   Cholesterol No results found for this basename: CHOL in the last 72 hours Cardiac Enzymes No components found with this basename: TROPONIN:3, CKMB:3  Studies/Results: @RISRSLT2 @   Assessment/Plan    Principal Problem:  *Dehydration Active Problems:  OBESITY, MORBID  OBSTRUCTIVE SLEEP APNEA  HYPERTENSION   HYPERTENSION, PULMONARY  ATRIAL FIBRILLATION, HX OF  PACEMAKER, PERMANENT  Obesity hypoventilation syndrome  Thyroiditis, autoimmune  Cardiomyopathy, nonischemic  Type 2 diabetes mellitus  Plan:  Patient states baseline weight in ~323.  She is 355 on admission.  Will check CXR and BNP this AM.  Difficult to tell if she is volume overloaded.  May just be obesity hypoventilation  Syn and deconditioning.  She is apparently going to the gym weekly.  SCr  In April and Aug 1.43.  Now 1.27.     LOS: 1 day    [name] 11/04/2012 8:21 AM

## 2012-11-04 NOTE — Progress Notes (Signed)
Nutrition Brief Note  Patient identified on the Malnutrition Screening Tool (MST) Report  Wt Readings from Last 5 Encounters:  11/03/12 356 lb 0.7 oz (161.5 kg)  10/15/12 328 lb 8 oz (149.007 kg)  08/26/12 324 lb (146.965 kg)  08/19/12 322 lb (146.058 kg)  07/22/12 322 lb (146.058 kg)  Weight is trending up. Some weight loss would be beneficial for this pt.   Body mass index is 55.76 kg/(m^2). Pt meets criteria for Morbid obeisty based on current BMI.   Current diet order is Carb mod medium, patient is consuming approximately 25% of meals at this time. Labs and medications reviewed.   No nutrition interventions warranted at this time. If nutrition issues arise, please consult RD.   Clarene Duke RD, LDN Pager [phone] After Hours pager [phone]

## 2012-11-04 NOTE — Progress Notes (Signed)
Patient says that she does not want to wear Cpap during her stay at the hospital.  She says that she wears it at home, but she only wants to wear 2lpm nasal cannula at night.

## 2012-11-04 NOTE — Progress Notes (Addendum)
Pt. Seen and examined. Agree with the NP/PA-C note as written.   CXR does show interstitial prominence which is likely pulmonary edema. Await BNP. Will add digoxin level. Trial of diuretics. If digoxin level appropriate, would restart as she reports decompensating off of it in the past.  Chrystie Nose, MD, [facility] Attending Cardiologist [facility]  Addendum: BNP is ~8000 .Marland Kitchen Dig level is slightly high. Will restart digoxin at 125 mcg daily. Start lasix 40 mg IV BID tonight. Follow strict I's/O's an daily weights.

## 2012-11-04 NOTE — H&P (Signed)
Pt. Seen and examined. Agree with the NP/PA-C note as written.  Agree with plan to hold digoxin due to acute renal insufficiency. Check digoxin level. Agree with CXR and BNP as difficult to assess volume status, however, I suspect she is volume overloaded and will benefit from diuretics.    Chrystie Nose, MD, [facility] Attending Cardiologist [facility]

## 2012-11-05 ENCOUNTER — Encounter (HOSPITAL_COMMUNITY): Payer: Self-pay | Admitting: Cardiology

## 2012-11-05 DIAGNOSIS — I5030 Unspecified diastolic (congestive) heart failure: Secondary | ICD-10-CM

## 2012-11-05 DIAGNOSIS — I5042 Chronic combined systolic (congestive) and diastolic (congestive) heart failure: Secondary | ICD-10-CM | POA: Diagnosis present

## 2012-11-05 HISTORY — DX: Unspecified diastolic (congestive) heart failure: I50.30

## 2012-11-05 LAB — BASIC METABOLIC PANEL
BUN: 34 mg/dL — ABNORMAL HIGH (ref 6–23)
CO2: 32 mEq/L (ref 19–32)
Chloride: 95 mEq/L — ABNORMAL LOW (ref 96–112)
Creatinine, Ser: 1.58 mg/dL — ABNORMAL HIGH (ref 0.50–1.10)
Glucose, Bld: 178 mg/dL — ABNORMAL HIGH (ref 70–99)

## 2012-11-05 LAB — PRO B NATRIURETIC PEPTIDE: Pro B Natriuretic peptide (BNP): 5249 pg/mL — ABNORMAL HIGH (ref 0–125)

## 2012-11-05 LAB — PROTIME-INR
INR: 2.46 — ABNORMAL HIGH (ref 0.00–1.49)
Prothrombin Time: 25.5 seconds — ABNORMAL HIGH (ref 11.6–15.2)

## 2012-11-05 MED ORDER — MUPIROCIN 2 % EX OINT
TOPICAL_OINTMENT | Freq: Two times a day (BID) | CUTANEOUS | Status: DC
  Administered 2012-11-05: 1 via TOPICAL
  Administered 2012-11-06 – 2012-11-08 (×5): via TOPICAL
  Filled 2012-11-05: qty 22

## 2012-11-05 MED ORDER — AMIODARONE HCL 200 MG PO TABS
200.0000 mg | ORAL_TABLET | Freq: Two times a day (BID) | ORAL | Status: DC
  Administered 2012-11-05 – 2012-11-08 (×6): 200 mg via ORAL
  Filled 2012-11-05 (×7): qty 1

## 2012-11-05 MED ORDER — DIGOXIN 0.0625 MG HALF TABLET
0.0625 mg | ORAL_TABLET | Freq: Every day | ORAL | Status: DC
  Administered 2012-11-06 – 2012-11-08 (×3): 0.0625 mg via ORAL
  Filled 2012-11-05 (×3): qty 1

## 2012-11-05 MED ORDER — METOPROLOL TARTRATE 25 MG PO TABS
37.5000 mg | ORAL_TABLET | Freq: Two times a day (BID) | ORAL | Status: DC
  Administered 2012-11-05 – 2012-11-08 (×6): 37.5 mg via ORAL
  Filled 2012-11-05 (×7): qty 1

## 2012-11-05 MED ORDER — GLYBURIDE 5 MG PO TABS
10.0000 mg | ORAL_TABLET | Freq: Every day | ORAL | Status: DC
  Administered 2012-11-06 – 2012-11-08 (×3): 10 mg via ORAL
  Filled 2012-11-05 (×4): qty 2

## 2012-11-05 MED ORDER — LEVOTHYROXINE SODIUM 125 MCG PO TABS: 62.5000 ug | ORAL_TABLET | ORAL | Status: DC

## 2012-11-05 MED ORDER — GLYBURIDE 5 MG PO TABS
5.0000 mg | ORAL_TABLET | Freq: Every day | ORAL | Status: DC
Start: 2012-11-05 — End: 2012-11-08
  Administered 2012-11-05 – 2012-11-07 (×3): 5 mg via ORAL
  Filled 2012-11-05 (×4): qty 1

## 2012-11-05 MED ORDER — LEVOTHYROXINE SODIUM 125 MCG PO TABS
62.5000 ug | ORAL_TABLET | ORAL | Status: DC
  Administered 2012-11-07: 62.5 ug via ORAL
  Filled 2012-11-05 (×2): qty 0.5

## 2012-11-05 MED ORDER — WARFARIN SODIUM 2.5 MG PO TABS
2.5000 mg | ORAL_TABLET | ORAL | Status: DC
  Administered 2012-11-06: 2.5 mg via ORAL
  Filled 2012-11-05 (×2): qty 1

## 2012-11-05 MED ORDER — LEVOTHYROXINE SODIUM 75 MCG PO TABS
75.0000 ug | ORAL_TABLET | ORAL | Status: DC
  Administered 2012-11-06 – 2012-11-08 (×2): 75 ug via ORAL
  Filled 2012-11-05 (×2): qty 1

## 2012-11-05 NOTE — Progress Notes (Addendum)
Subjective: "Finally I can breathe better" after receiving IV lasix- has rec'd 2 doses. Pt voices her concern that IUD -Mirena- she has for cervical polyps and complex hyperplasia is causing the increased edema. Will ask pharmacy to check for edema, does cause wt. Gain. She also complains of her diabetic meds not being correct.  Objective: Vital signs in last 24 hours: Temp:  [98.2 F (36.8 C)-98.5 F (36.9 C)] 98.5 F (36.9 C) (11/06 0628) Pulse Rate:  [71-79] 71  (11/06 0905) Resp:  [18-20] 18  (11/06 0628) BP: (102-123)/(59-74) 102/62 mmHg (11/06 0905) SpO2:  [98 %-100 %] 98 % (11/06 0628) Weight:  [161.4 kg (355 lb 13.2 oz)] 161.4 kg (355 lb 13.2 oz) (11/06 0628) Weight change: -0.1 kg (-3.5 oz) Last BM Date: 11/03/12 Intake/Output from previous day: -1736 --no wieghts 11/05 0701 - 11/06 0700 In: 964 [P.O.:960; IV Piggyback:4] Out: 2700 [Urine:2700] Intake/Output this shift: Total I/O In: 240 [P.O.:240] Out: 1250 [Urine:1250]  PE: General:alert and oriented, talking without SOB, though with walking she states her HR elevated and SOB Heart:S1S2 RRR with premature beats Lungs:fairly clear RUE:AVWUJ, soft, non tender + BS Ext:1+ edema mid calf down, she states it has improved in last 24 hours.    Lab Results:  [facility] 11/03/12 2036  WBC 6.2  HGB 11.3*  HCT 36.1  PLT 155   BMET  Basename 11/04/12 0605 11/03/12 2036  NA 136 139  K 4.0 3.8  CL 96 100  CO2 32 31  GLUCOSE 193* 201*  BUN 27* 26*  CREATININE 1.27* 1.21*  CALCIUM 9.4 9.4   Lab Results  Component Value Date   HGBA1C 7.1 10/15/2012     Lab Results  Component Value Date   TSH 3.745 10/27/2012        Studies/Results: Dg Chest 2 View  11/04/2012  *RADIOLOGY REPORT*  Clinical Data: Chest pain and dyspnea on exertion  CHEST - 2 VIEW  Comparison: 08/30/2011  Findings: The heart is again enlarged in size.  A pacing device is again noted and stable.  The lungs are clear bilaterally.  No acute bony  abnormality is seen.  IMPRESSION: No acute intrathoracic abnormality.   Original Report Authenticated By: Alcide Clever, M.D.     Medications: I have reviewed the patient's current medications.    Marland Kitchen amiodarone  300 mg Oral QHS  . calcitRIOL  0.25 mcg Oral BID  . calcium-vitamin D  1 tablet Oral BID  . digoxin  0.125 mg Oral Daily  . furosemide  40 mg Intravenous BID  . glyBURIDE  5 mg Oral BID WC  . HYDROcodone-acetaminophen  1 tablet Oral BID  . insulin aspart  0-20 Units Subcutaneous TID WC  . insulin glargine  30 Units Subcutaneous QHS  . iron polysaccharides  150 mg Oral BID  . levothyroxine  62.5 mcg Oral Q48H  . levothyroxine  75 mcg Oral Q48H  . linagliptin  5 mg Oral Q breakfast  . metoprolol tartrate  25 mg Oral BID  . potassium chloride SA  20 mEq Oral BID WC  . tiotropium  18 mcg Inhalation QODAY  . [COMPLETED] warfarin  2.5 mg Oral Q48H  . warfarin  2.5 mg Oral Q48H  . warfarin  5 mg Oral Q48H  . Warfarin - Pharmacist Dosing Inpatient   Does not apply q1800  . [DISCONTINUED] amiodarone  300 mg Oral Daily  . [DISCONTINUED] amiodarone  300 mg Oral QHS  . [DISCONTINUED] digoxin  0.25 mg Oral Daily  . [  DISCONTINUED] levothyroxine  62.5 mcg Oral QAC breakfast  . [DISCONTINUED] levothyroxine  62.5 mcg Oral Q48H  . [DISCONTINUED] linagliptin  5 mg Oral Daily  . [DISCONTINUED] potassium chloride SA  20 mEq Oral BID   Assessment/Plan: Principal Problem:  *Acute on chronic diastolic HF (heart failure) Active Problems:  OBESITY, MORBID  OBSTRUCTIVE SLEEP APNEA  HYPERTENSION  HYPERTENSION, PULMONARY  ATRIAL FIBRILLATION, HX OF  PACEMAKER, PERMANENT  Obesity hypoventilation syndrome  Thyroiditis, autoimmune  Cardiomyopathy, nonischemic  Type 2 diabetes mellitus  PLAN:on Lasix 40 mg IV BID, Pro BNP yest 8028. I have no Pro BNP to compare but BNP was 204 in 2011   Cr. Up slightly- yesterday but still needs 24 hours more of IV lasix.- WILL ORDER DAILY bmp INR today  2.46  Dig level 1.3 Decreased dig. To 0.0625 from 0.125 daily, which was after higher dose given.  Glucose slightly elevated, will adjust meds more to her home dose.   Will have pt ambulate with cardiac rehab, to check HR and spo2.  Have asked pharmacy to evaluate possibility of IUD causing edema vs. PAF which she had on pacemaker interrogation in office just prior to admit.     Addendum:  Cr. Now 1.58,  Pro BNP 5249.  Pharmacy found edema <5% to be a side effect of the IUD that the pt has see shadow chart.   On interrogation of pacer in office on admit She had several episodes of Afib and was in a. Fib on admit.  Now she is av pacing.  I talked with Dr. Royann Shivers and he was concerned there was some dig. Toxicity.  Also initially thought to be dry because of  Decrease in weight with office scale, but here wt is same.  Also with diuresing pt feels better.  Most likely edema is coming from her PAF.  LOS: 2 days   INGOLD,LAURA R 11/05/2012, 12:53 PM  I seen and evaluated the patient this morning along with the PA/NP. I agree with their findings, examination as well as impression recommendations.  Seems to be feeling better with diuresis confirming volume overload from likely diastolic HF exacerbation from Afib- Cr starting to increase, consider converting to PO lasix after AM dose for 1-2 doses to allow re-equilibration of fluids.  Recheck BNP today with drop as anticipated, recheck in ~2 days.  She seems quite volume overloaded on exam & still SOB with ambulation.    Remains in a paced rhythm now (intermittently A-V paced).  INR therapeutic.  Dig level normal -- not likely dig toxic, restarted per Dr. Rennis Golden.  Will temporarily change Amiodarone to 200mg  bid & increase BB dose to improve rate control as her rapid AF spells really "set her back" DM medications adjusted - monitor how much SSI she requires.  DM education team consult may be appropriate to get d/c regimen stable & teaching provided.      Thyroid replacement Rx.  Marykay Lex, M.D., M.S. [facility] [address] [place], [place]  [address]  [phone] Pager # [phone] 11/05/2012 5:44 PM

## 2012-11-05 NOTE — Progress Notes (Signed)
CARDIAC REHAB PHASE I   PRE:  Rate/Rhythm: 71 paced    BP: sitting 129/90    SaO2: 100 RA  MODE:  Ambulation: 120 ft   POST:  Rate/Rhythm: 81 pacing    BP: sitting 143/85     SaO2: 100 RA  Pt used RW (does not use at home), walked independently. Had to rest x2-3 due to DOE. SaO2 maintained 100 RA with spot checks during walk. Telemetry shows 81 pacing at max with activity. Return to recliner. Put feet up, which pt sts she has not been able to do herself in recliner. Will f/u. [phone]  Harriet Masson CES, ACSM

## 2012-11-05 NOTE — Progress Notes (Addendum)
ANTICOAGULATION CONSULT NOTE - Follow Up Consult  Pharmacy Consult for Coumadin Indication: atrial fibrillation  Allergies  Allergen Reactions  . Ivp Dye (Iodinated Diagnostic Agents) Shortness Of Breath    Turn red, can't breathe  . Diphenhydramine Hcl Swelling    In hands and eyes  . Fish Allergy Nausea And Vomiting  . Iohexol      Desc: PT TURNS RED AND WHEEZING   . Penicillins Other (See Comments)    Resp arrest as child  . Povidone Other (See Comments)    Wheezing, turn red, can't breath, and blisters  . Promethazine Hcl Other (See Comments)    Low Blood Pressure  . Tape     Blisters, can use paper tape for short periods  . Clindamycin Hives and Rash    wheezing  . Iodine Rash  . Morphine Sulfate Nausea And Vomiting and Rash    Patient Measurements: Height: 5\' 7"  (170.2 cm) Weight: 355 lb 13.2 oz (161.4 kg) (scale C) IBW/kg (Calculated) : 61.6   Vital Signs: Temp: 98.5 F (36.9 C) (11/06 0628) Temp src: Oral (11/06 0628) BP: 102/62 mmHg (11/06 0905) Pulse Rate: 71  (11/06 0905)  Labs:  Basename 11/05/12 0545 11/04/12 1026 11/04/12 0605 11/03/12 2036  HGB -- -- -- 11.3*  HCT -- -- -- 36.1  PLT -- -- -- 155  APTT -- -- -- --  LABPROT 25.5* 23.9* -- 25.5*  INR 2.46* 2.25* -- 2.46*  HEPARINUNFRC -- -- -- --  CREATININE -- -- 1.27* 1.21*  CKTOTAL -- -- -- --  CKMB -- -- -- --  TROPONINI -- -- -- --    Estimated Creatinine Clearance: 71.7 ml/min (by C-G formula based on Cr of 1.27).  Assessment:   INR remains therapeutic on home Coumadin regimen of 5 mg alternating with 2.5 mg.  Goal of Therapy:  INR 2-3 Monitor platelets by anticoagulation protocol: Yes   Plan:   Continue home regimen of 5 mg alternating with 2.5 mg.  Change PT/INR to MWF.  Will recheck CBC on Friday (11/8).  Dennie Fetters, RPh Pager: [phone] 11/05/2012,11:24 AM  Addendum:  - Regarding Mirena as a possible cause of edema.  - Listed as occuring in <5% of patients,  but I did find a case report of exercise-induced edema thought to have been due to Mirena, as other causes were ruled out, and the symptoms resolved after the device removal.  Discussed with L. Ingold, PA-C.  A copy of the abstract is in the shadow chart.   Mirena should also be used with caution in patients on anticoagulants, and those who have hypertension. Can also cause ovarian cysts in ~12% of patients. Noted concern for cervical polyps and complex hyperplasia causing increased edema.  T. Annmarie Plemmons,RPh 11/05/2012 2:25pm

## 2012-11-06 LAB — HEPATIC FUNCTION PANEL
ALT: 15 U/L (ref 0–35)
AST: 17 U/L (ref 0–37)
Bilirubin, Direct: 0.3 mg/dL (ref 0.0–0.3)

## 2012-11-06 LAB — GLUCOSE, CAPILLARY
Glucose-Capillary: 143 mg/dL — ABNORMAL HIGH (ref 70–99)
Glucose-Capillary: 162 mg/dL — ABNORMAL HIGH (ref 70–99)

## 2012-11-06 LAB — BASIC METABOLIC PANEL
BUN: 37 mg/dL — ABNORMAL HIGH (ref 6–23)
CO2: 30 mEq/L (ref 19–32)
Chloride: 97 mEq/L (ref 96–112)
GFR calc Af Amer: 38 mL/min — ABNORMAL LOW (ref >90)
Glucose, Bld: 125 mg/dL — ABNORMAL HIGH (ref 70–99)
Potassium: 4.2 mEq/L (ref 3.5–5.1)

## 2012-11-06 MED ORDER — FUROSEMIDE 10 MG/ML IJ SOLN
20.0000 mg | Freq: Two times a day (BID) | INTRAMUSCULAR | Status: AC
  Administered 2012-11-06 – 2012-11-07 (×2): 20 mg via INTRAVENOUS

## 2012-11-06 NOTE — Progress Notes (Signed)
[facility]  Subjective: No Complaints  Objective: Vital signs in last 24 hours: Temp:  [97.1 F (36.2 C)-98.6 F (37 C)] 98.3 F (36.8 C) (11/07 0447) Pulse Rate:  [71-78] 78  (11/07 0447) Resp:  [18-20] 18  (11/07 0447) BP: (102-126)/(62-81) 114/72 mmHg (11/07 0447) SpO2:  [97 %-100 %] 97 % (11/07 0447) Weight:  [159.3 kg (351 lb 3.1 oz)] 159.3 kg (351 lb 3.1 oz) (11/07 0447) Last BM Date: 11/05/12  Intake/Output from previous day: 11/06 0701 - 11/07 0700 In: 680 [P.O.:680] Out: 4350 [Urine:4350] Intake/Output this shift:    Medications Current Facility-Administered Medications  Medication Dose Route Frequency Provider Last Rate Last Dose  . albuterol (PROVENTIL HFA;VENTOLIN HFA) 108 (90 BASE) MCG/ACT inhaler 2 puff  2 puff Inhalation Q6H PRN Wilburt Finlay, PA      . amiodarone (PACERONE) tablet 200 mg  200 mg Oral BID Marykay Lex, MD   200 mg at 11/05/12 2117  . calcitRIOL (ROCALTROL) capsule 0.25 mcg  0.25 mcg Oral BID Wilburt Finlay, PA   0.25 mcg at 11/05/12 2225  . calcium-vitamin D (OSCAL WITH D) 500-200 MG-UNIT per tablet 1 tablet  1 tablet Oral BID Thurmon Fair, MD   1 tablet at 11/05/12 2117  . digoxin (LANOXIN) tablet 0.0625 mg  0.0625 mg Oral Daily Nada Boozer, NP      . furosemide (LASIX) injection 40 mg  40 mg Intravenous BID Chrystie Nose, MD   40 mg at 11/06/12 0744  . glyBURIDE (DIABETA) tablet 10 mg  10 mg Oral QAC breakfast Nada Boozer, NP   10 mg at 11/06/12 1610  . glyBURIDE (DIABETA) tablet 5 mg  5 mg Oral QAC supper Nada Boozer, NP   5 mg at 11/05/12 1723  . HYDROcodone-acetaminophen (NORCO) 10-325 MG per tablet 1 tablet  1 tablet Oral BID Amy L. [place], [place]   1 tablet at 11/06/12 0744  . insulin aspart (novoLOG) injection 0-20 Units  0-20 Units Subcutaneous TID WC Wilburt Finlay, PA   3 Units at 11/06/12 9604  . insulin glargine (LANTUS) injection 30 Units  30 Units Subcutaneous QHS Wilburt Finlay, [place]   30 Units at  11/05/12 2117  . iron polysaccharides (NIFEREX) capsule 150 mg  150 mg Oral BID Wilburt Finlay, PA   150 mg at 11/05/12 2117  . levothyroxine (SYNTHROID, LEVOTHROID) tablet 62.5 mcg  62.5 mcg Oral Q48H Deaisa Merida, MD      . levothyroxine (SYNTHROID, LEVOTHROID) tablet 75 mcg  75 mcg Oral Q48H Grover Robinson, MD   75 mcg at 11/06/12 0620  . linagliptin (TRADJENTA) tablet 5 mg  5 mg Oral Q breakfast Ladonya Jerkins, MD   5 mg at 11/06/12 0620  . metoprolol tartrate (LOPRESSOR) tablet 37.5 mg  37.5 mg Oral BID Marykay Lex, MD   37.5 mg at 11/05/12 2117  . mupirocin ointment (BACTROBAN) 2 %   Topical BID Nada Boozer, NP   1 application at 11/05/12 2118  . nitroGLYCERIN (NITRODUR - Dosed in mg/24 hr) patch 0.4 mg  0.4 mg Transdermal PRN Wilburt Finlay, PA      . potassium chloride SA (K-DUR,KLOR-CON) CR tablet 20 mEq  20 mEq Oral BID WC Yurianna Tusing, MD   20 mEq at 11/06/12 0620  . tiotropium (SPIRIVA) inhalation capsule 18 mcg  18 mcg Inhalation QODAY Wilburt Finlay, PA      . warfarin (COUMADIN) tablet 2.5 mg  2.5 mg Oral Q48H Dennie Fetters, [facility]      .  warfarin (COUMADIN) tablet 5 mg  5 mg Oral Q48H Jaquelyne Firkus, MD   5 mg at 11/05/12 1722  . Warfarin - Pharmacist Dosing Inpatient   Does not apply q1800 Kourtni Stineman, MD      . zolpidem (AMBIEN) tablet 5 mg  5 mg Oral QHS PRN Wilburt Finlay, PA   5 mg at 11/05/12 2225  . [DISCONTINUED] amiodarone (PACERONE) tablet 300 mg  300 mg Oral QHS Rashan Patient, MD   300 mg at 11/04/12 2142  . [DISCONTINUED] digoxin (LANOXIN) tablet 0.125 mg  0.125 mg Oral Daily Chrystie Nose, MD   0.125 mg at 11/05/12 0906  . [DISCONTINUED] glyBURIDE (DIABETA) tablet 5 mg  5 mg Oral BID WC Wilburt Finlay, PA   5 mg at 11/05/12 8119  . [DISCONTINUED] levothyroxine (SYNTHROID, LEVOTHROID) tablet 62.5 mcg  62.5 mcg Oral QAC breakfast Wilburt Finlay, PA   62.5 mcg at 11/05/12 1478  . [DISCONTINUED] levothyroxine (SYNTHROID, LEVOTHROID) tablet 62.5 mcg  62.5 mcg Oral Q48H  Doni Widmer, MD      . [DISCONTINUED] metoprolol tartrate (LOPRESSOR) tablet 25 mg  25 mg Oral BID Wilburt Finlay, PA   25 mg at 11/05/12 0905    PE: General appearance: alert, cooperative and no distress Lungs: clear to auscultation bilaterally Heart: regular rate and rhythm, S1, S2 normal, no murmur, click, rub or gallop Extremities: 2+ LEE Pulses: 2+ and symmetric Skin: warm and dry. Neurologic: Grossly normal  Lab Results:   [facility] 11/03/12 2036  WBC 6.2  HGB 11.3*  HCT 36.1  PLT 155   BMET  Basename 11/06/12 0535 11/05/12 1325 11/04/12 0605  NA 139 137 136  K 4.2 4.3 4.0  CL 97 95* 96  CO2 30 32 32  GLUCOSE 125* 178* 193*  BUN 37* 34* 27*  CREATININE 1.62* 1.58* 1.27*  CALCIUM 9.5 9.5 9.4   PT/INR  Basename 11/05/12 0545 11/04/12 1026 11/03/12 2036  LABPROT 25.5* 23.9* 25.5*  INR 2.46* 2.25* 2.46*     Assessment/Plan   Principal Problem:  *Acute on chronic diastolic HF (heart failure) Active Problems:  OBESITY, MORBID  OBSTRUCTIVE SLEEP APNEA  HYPERTENSION  HYPERTENSION, PULMONARY  ATRIAL FIBRILLATION, HX OF  PACEMAKER, PERMANENT  Obesity hypoventilation syndrome  Thyroiditis, autoimmune  Cardiomyopathy, nonischemic  Type 2 diabetes mellitus  Plan:  Net fluids -5.4L to date.  Ambulating with O2 100% on RA.   BNP  8028.0>>>5249.0 >>> 3876.0 (Last).  Labs stable.  BP and HR stable.  Continue current lasix therapy.   LOS: 3 days    [name], [name] 11/06/2012 8:29 AM  I have seen and examined the patient along with [name], [name], PA.  I have reviewed the chart, notes and new data.  I agree with PA's note.  Key new complaints: dyspnea better, but feels a little dizzy and weak Key examination changes: clear lungs, JVP only 1 cm above sternal angle, edema partly improved Key new findings / data: creat up to 1.6  PLAN: Reduce diuretic dose, anticipate change to PO diuretics tomorrow/  Thurmon Fair, MD, [facility] [phone] 11/06/2012, 9:56 AM

## 2012-11-06 NOTE — Progress Notes (Signed)
CARDIAC REHAB PHASE I   PRE:  Rate/Rhythm: 71 paced    BP: sitting 112/65    SaO2: 94 RA  MODE:  Ambulation: 160 ft   POST:  Rate/Rhythm: 81 paced    BP: sitting 122/80     SaO2: 94 RA  Tolerated ok. X3 rest stops. Still SOB with chest tightness due to breathing. To recliner. Will f/u. [phone]  Harriet Masson CES, ACSM

## 2012-11-07 LAB — GLUCOSE, CAPILLARY
Glucose-Capillary: 146 mg/dL — ABNORMAL HIGH (ref 70–99)
Glucose-Capillary: 151 mg/dL — ABNORMAL HIGH (ref 70–99)

## 2012-11-07 LAB — PROTIME-INR: Prothrombin Time: 22.6 seconds — ABNORMAL HIGH (ref 11.6–15.2)

## 2012-11-07 LAB — CBC
MCH: 26.5 pg (ref 26.0–34.0)
Platelets: 187 10*3/uL (ref 150–400)
RBC: 4.19 MIL/uL (ref 3.87–5.11)
RDW: 16.7 % — ABNORMAL HIGH (ref 11.5–15.5)
WBC: 5.8 10*3/uL (ref 4.0–10.5)

## 2012-11-07 LAB — BASIC METABOLIC PANEL
CO2: 33 mEq/L — ABNORMAL HIGH (ref 19–32)
Calcium: 9.1 mg/dL (ref 8.4–10.5)
Chloride: 98 mEq/L (ref 96–112)
Creatinine, Ser: 1.78 mg/dL — ABNORMAL HIGH (ref 0.50–1.10)
GFR calc Af Amer: 34 mL/min — ABNORMAL LOW (ref >90)
Sodium: 139 mEq/L (ref 135–145)

## 2012-11-07 MED ORDER — FUROSEMIDE 40 MG PO TABS
40.0000 mg | ORAL_TABLET | Freq: Two times a day (BID) | ORAL | Status: DC
  Administered 2012-11-08: 40 mg via ORAL
  Filled 2012-11-07 (×3): qty 1

## 2012-11-07 NOTE — Progress Notes (Signed)
ANTICOAGULATION CONSULT NOTE - Follow Up Consult  Pharmacy Consult for Coumadin Indication: atrial fibrillation  Allergies  Allergen Reactions  . Ivp Dye (Iodinated Diagnostic Agents) Shortness Of Breath    Turn red, can't breathe  . Diphenhydramine Hcl Swelling    In hands and eyes  . Fish Allergy Nausea And Vomiting  . Iohexol      Desc: PT TURNS RED AND WHEEZING   . Penicillins Other (See Comments)    Resp arrest as child  . Povidone Other (See Comments)    Wheezing, turn red, can't breath, and blisters  . Promethazine Hcl Other (See Comments)    Low Blood Pressure  . Tape     Blisters, can use paper tape for short periods  . Clindamycin Hives and Rash    wheezing  . Iodine Rash  . Morphine Sulfate Nausea And Vomiting and Rash    Patient Measurements: Height: 5\' 7"  (170.2 cm) Weight: 351 lb 1.6 oz (159.258 kg) (scale c) IBW/kg (Calculated) : 61.6   Vital Signs: Temp: 97.7 F (36.5 C) (11/08 0519) Temp src: Oral (11/08 0519) BP: 128/58 mmHg (11/08 1026) Pulse Rate: 76  (11/08 0519)  Labs:  Basename 11/07/12 8413 11/06/12 0535 11/05/12 1325 11/05/12 0545  HGB 11.1* -- -- --  HCT 35.8* -- -- --  PLT 187 -- -- --  APTT -- -- -- --  LABPROT 22.6* -- -- 25.5*  INR 2.09* -- -- 2.46*  HEPARINUNFRC -- -- -- --  CREATININE 1.78* 1.62* 1.58* --  CKTOTAL -- -- -- --  CKMB -- -- -- --  TROPONINI -- -- -- --    Estimated Creatinine Clearance: 50.8 ml/min (by C-G formula based on Cr of 1.78).  Assessment:   INR remains therapeutic on home Coumadin regimen of 5 mg alternating with 2.5 mg, though it has trended down some.  5 mg dose due today. Patient relates taking this regimen for some time.  Amiodarone adjusted from 300 mg daily to 200 mg BID on 11/6 pm.  Goal of Therapy:  INR 2-3 Monitor platelets by anticoagulation protocol: Yes   Plan:   Continue home regimen of 5 mg alternating with 2.5 mg.  Continue MWF protimes while in the hospital.  May go home  11/9.   Dennie Fetters, [place] Pager: [phone] 11/07/2012,2:37 PM

## 2012-11-07 NOTE — Progress Notes (Signed)
[facility]  Subjective: Feeling better  Objective: Vital signs in last 24 hours: Temp:  [97.6 F (36.4 C)-98.2 F (36.8 C)] 97.7 F (36.5 C) (11/08 0519) Pulse Rate:  [68-76] 76  (11/08 0519) Resp:  [18] 18  (11/08 0519) BP: (112-128)/(65-79) 128/71 mmHg (11/08 0519) SpO2:  [97 %-100 %] 100 % (11/08 0519) Weight:  [159.258 kg (351 lb 1.6 oz)] 159.258 kg (351 lb 1.6 oz) (11/08 0519) Last BM Date: 11/06/12  Intake/Output from previous day: 11/07 0701 - 11/08 0700 In: 1050 [P.O.:1050] Out: 3850 [Urine:3850] Intake/Output this shift: Total I/O In: 240 [P.O.:240] Out: -   Medications Current Facility-Administered Medications  Medication Dose Route Frequency Provider Last Rate Last Dose  . albuterol (PROVENTIL HFA;VENTOLIN HFA) 108 (90 BASE) MCG/ACT inhaler 2 puff  2 puff Inhalation Q6H PRN Wilburt Finlay, PA      . amiodarone (PACERONE) tablet 200 mg  200 mg Oral BID Marykay Lex, MD   200 mg at 11/06/12 2217  . calcitRIOL (ROCALTROL) capsule 0.25 mcg  0.25 mcg Oral BID Wilburt Finlay, PA   0.25 mcg at 11/06/12 2217  . calcium-vitamin D (OSCAL WITH D) 500-200 MG-UNIT per tablet 1 tablet  1 tablet Oral BID Thurmon Fair, MD   1 tablet at 11/06/12 2217  . digoxin (LANOXIN) tablet 0.0625 mg  0.0625 mg Oral Daily Nada Boozer, NP   0.0625 mg at 11/06/12 1104  . [COMPLETED] furosemide (LASIX) injection 20 mg  20 mg Intravenous BID Sherrye Puga, MD   20 mg at 11/07/12 0800  . glyBURIDE (DIABETA) tablet 10 mg  10 mg Oral QAC breakfast Nada Boozer, NP   10 mg at 11/07/12 0636  . glyBURIDE (DIABETA) tablet 5 mg  5 mg Oral QAC supper Nada Boozer, NP   5 mg at 11/06/12 1707  . HYDROcodone-acetaminophen (NORCO) 10-325 MG per tablet 1 tablet  1 tablet Oral BID Amy L. [place], [place]   1 tablet at 11/07/12 0759  . insulin aspart (novoLOG) injection 0-20 Units  0-20 Units Subcutaneous TID WC Wilburt Finlay, PA   4 Units at 11/07/12 [phone]  . insulin glargine (LANTUS)  injection 30 Units  30 Units Subcutaneous QHS Wilburt Finlay, PA   30 Units at 11/06/12 2218  . iron polysaccharides (NIFEREX) capsule 150 mg  150 mg Oral BID Wilburt Finlay, PA   150 mg at 11/06/12 2217  . levothyroxine (SYNTHROID, LEVOTHROID) tablet 62.5 mcg  62.5 mcg Oral Q48H Laguana Desautel, MD   62.5 mcg at 11/07/12 0635  . levothyroxine (SYNTHROID, LEVOTHROID) tablet 75 mcg  75 mcg Oral Q48H Dreydon Cardenas, MD   75 mcg at 11/06/12 0620  . linagliptin (TRADJENTA) tablet 5 mg  5 mg Oral Q breakfast Kadeja Granada, MD   5 mg at 11/07/12 0635  . metoprolol tartrate (LOPRESSOR) tablet 37.5 mg  37.5 mg Oral BID Marykay Lex, MD   37.5 mg at 11/06/12 2217  . mupirocin ointment (BACTROBAN) 2 %   Topical BID Nada Boozer, NP      . nitroGLYCERIN (NITRODUR - Dosed in mg/24 hr) patch 0.4 mg  0.4 mg Transdermal PRN Wilburt Finlay, PA      . potassium chloride SA (K-DUR,KLOR-CON) CR tablet 20 mEq  20 mEq Oral BID WC Alenah Sarria, MD   20 mEq at 11/07/12 0636  . tiotropium (SPIRIVA) inhalation capsule 18 mcg  18 mcg Inhalation Wynelle Cleveland, PA   18 mcg at 11/06/12 0933  . warfarin (COUMADIN)  tablet 2.5 mg  2.5 mg Oral Q48H Dennie Fetters, RPH   2.5 mg at 11/06/12 1707  . warfarin (COUMADIN) tablet 5 mg  5 mg Oral Q48H Yanelli Zapanta, MD   5 mg at 11/05/12 1722  . Warfarin - Pharmacist Dosing Inpatient   Does not apply q1800 Marvin Grabill, MD      . zolpidem (AMBIEN) tablet 5 mg  5 mg Oral QHS PRN Wilburt Finlay, PA   5 mg at 11/06/12 2316  . [DISCONTINUED] furosemide (LASIX) injection 40 mg  40 mg Intravenous BID Chrystie Nose, MD   40 mg at 11/06/12 0744    PE: General appearance: alert, cooperative and no distress  Lungs: clear to auscultation bilaterally  Heart: regular rate and rhythm, S1, S2 normal, no murmur, click, rub or gallop  Extremities: 2+ LEE  Pulses: 2+ and symmetric  Skin: warm and dry.  Neurologic: Grossly normal    Lab Results:   [facility] 11/07/12 0633  WBC 5.8  HGB  11.1*  HCT 35.8*  PLT 187   BMET  Basename 11/07/12 0633 11/06/12 0535 11/05/12 1325  NA 139 139 137  K 4.1 4.2 4.3  CL 98 97 95*  CO2 33* 30 32  GLUCOSE 144* 125* 178*  BUN 37* 37* 34*  CREATININE 1.78* 1.62* 1.58*  CALCIUM 9.1 9.5 9.5   PT/INR  Basename 11/07/12 0633 11/05/12 0545 11/04/12 1026  LABPROT 22.6* 25.5* 23.9*  INR 2.09* 2.46* 2.25*    Assessment/Plan  Principal Problem:  *Acute on chronic diastolic HF (heart failure) Active Problems:  OBESITY, MORBID  OBSTRUCTIVE SLEEP APNEA  HYPERTENSION  HYPERTENSION, PULMONARY  ATRIAL FIBRILLATION, HX OF  PACEMAKER, PERMANENT  Obesity hypoventilation syndrome  Thyroiditis, autoimmune  Cardiomyopathy, nonischemic  Type 2 diabetes mellitus  Plan:  Net fluids -8.2 L since admission.  Responded well to IV lasix.  Last dose this AM 20mg  IV.  SCr. is rising.  Will hold pm dose and Change to PO 40 bid.  She still has a large amount of LEE but is getting intravascularly dry.    Likely DC home tomorrow.     LOS: 4 days    [name], [name] 11/07/2012 9:32 AM  I have seen and examined the patient along with [name], [name], PA.  I have reviewed the chart, notes and new data.  I agree with PA's note.  PLAN: Significant improvement. Agree with switching to PO diuretics. She has been taking torsemide in the past, but I think furosemide appears to work just as well and may offer an easier IV to PO transition. Will need twice daily furosemide dosing at home and metolazone prn weight gain > 3 lb over discharge weight. Accurate weights will be very important. Schedule early follow-up (next week). Consider Heart failure Transitions clinic.  Thurmon Fair, MD, [facility] [phone] 11/07/2012, 4:59 PM

## 2012-11-07 NOTE — Progress Notes (Signed)
CARDIAC REHAB PHASE I   PRE:  Rate/Rhythm: 81 paced    BP: sitting 110/67    SaO2: 95 RA  MODE:  Ambulation: 120 ft   POST:  Rate/Rhythm: 81 paced    BP: sitting 120/80     SaO2: 95  RA  C/o leg cramps especially right leg. Sts hurts/cramping constantly in whole leg. Had difficulty standing, had to raise bed. Able to walk. Cont'd SOB. VSS.  [phone]  Harriet Masson CES, ACSM

## 2012-11-07 NOTE — Plan of Care (Signed)
Problem: Phase II Progression Outcomes Goal: Vital signs remain stable Outcome: Completed/Met Date Met:  11/07/12 pts vss, no c/o c/p, goal met  Problem: Phase III Progression Outcomes Goal: Pain controlled on oral analgesia Pt has not had any c/o pain, goal met

## 2012-11-08 LAB — BASIC METABOLIC PANEL
BUN: 37 mg/dL — ABNORMAL HIGH (ref 6–23)
CO2: 30 mEq/L (ref 19–32)
Calcium: 9.3 mg/dL (ref 8.4–10.5)
Creatinine, Ser: 1.69 mg/dL — ABNORMAL HIGH (ref 0.50–1.10)
GFR calc non Af Amer: 31 mL/min — ABNORMAL LOW (ref >90)
Glucose, Bld: 158 mg/dL — ABNORMAL HIGH (ref 70–99)
Sodium: 136 mEq/L (ref 135–145)

## 2012-11-08 LAB — GLUCOSE, CAPILLARY: Glucose-Capillary: 168 mg/dL — ABNORMAL HIGH (ref 70–99)

## 2012-11-08 MED ORDER — FUROSEMIDE 40 MG PO TABS: 40.0000 mg | ORAL_TABLET | Freq: Two times a day (BID) | ORAL | Status: DC

## 2012-11-08 MED ORDER — DIGOXIN 0.0625 MG HALF TABLET: 0.0625 mg | ORAL_TABLET | Freq: Every day | ORAL | Status: DC

## 2012-11-08 MED ORDER — AMIODARONE HCL 200 MG PO TABS: 200.0000 mg | ORAL_TABLET | Freq: Two times a day (BID) | ORAL | Status: DC

## 2012-11-08 MED ORDER — METOPROLOL TARTRATE 12.5 MG HALF TABLET: 37.5000 mg | ORAL_TABLET | Freq: Two times a day (BID) | ORAL | Status: DC

## 2012-11-08 MED ORDER — METOLAZONE 5 MG PO TABS: 5.0000 mg | ORAL_TABLET | ORAL | Status: DC | PRN

## 2012-11-08 NOTE — Plan of Care (Signed)
Problem: Discharge Progression Outcomes Goal: Discharge plan in place and appropriate Outcome: Completed/Met Date Met:  11/08/12 Pt given d/c instructions and follow up appointments, pt verbalized understanding, pt left via wheelchair to home, pt stable upon d/c

## 2012-11-08 NOTE — Progress Notes (Signed)
[facility]  Subjective: No Complaints.  Objective: Vital signs in last 24 hours: Temp:  [98.5 F (36.9 C)-98.8 F (37.1 C)] 98.5 F (36.9 C) (11/09 0532) Pulse Rate:  [71-97] 72  (11/09 0532) Resp:  [17-18] 18  (11/09 0532) BP: (111-128)/(58-71) 115/68 mmHg (11/09 0532) SpO2:  [96 %-99 %] 99 % (11/09 0532) Weight:  [159.848 kg (352 lb 6.4 oz)] 159.848 kg (352 lb 6.4 oz) (11/09 0532) Last BM Date: 11/07/12  Intake/Output from previous day: 11/08 0701 - 11/09 0700 In: 1360 [P.O.:1360] Out: 2226 [Urine:2225; Stool:1] Intake/Output this shift: Total I/O In: 240 [P.O.:240] Out: 450 [Urine:450]  Medications Current Facility-Administered Medications  Medication Dose Route Frequency Provider Last Rate Last Dose  . albuterol (PROVENTIL HFA;VENTOLIN HFA) 108 (90 BASE) MCG/ACT inhaler 2 puff  2 puff Inhalation Q6H PRN Wilburt Finlay, PA      . amiodarone (PACERONE) tablet 200 mg  200 mg Oral BID Marykay Lex, MD   200 mg at 11/07/12 2214  . calcitRIOL (ROCALTROL) capsule 0.25 mcg  0.25 mcg Oral BID Wilburt Finlay, PA   0.25 mcg at 11/07/12 2214  . calcium-vitamin D (OSCAL WITH D) 500-200 MG-UNIT per tablet 1 tablet  1 tablet Oral BID Thurmon Fair, MD   1 tablet at 11/07/12 2214  . digoxin (LANOXIN) tablet 0.0625 mg  0.0625 mg Oral Daily Nada Boozer, NP   0.0625 mg at 11/07/12 1030  . furosemide (LASIX) tablet 40 mg  40 mg Oral BID Wilburt Finlay, PA   40 mg at 11/08/12 0749  . glyBURIDE (DIABETA) tablet 10 mg  10 mg Oral QAC breakfast Nada Boozer, NP   10 mg at 11/08/12 8295  . glyBURIDE (DIABETA) tablet 5 mg  5 mg Oral QAC supper Nada Boozer, NP   5 mg at 11/07/12 1758  . HYDROcodone-acetaminophen (NORCO) 10-325 MG per tablet 1 tablet  1 tablet Oral BID Amy L. [place], [place]   1 tablet at 11/08/12 0749  . insulin aspart (novoLOG) injection 0-20 Units  0-20 Units Subcutaneous TID WC Wilburt Finlay, PA   4 Units at 11/08/12 0620  . insulin glargine (LANTUS)  injection 30 Units  30 Units Subcutaneous QHS Wilburt Finlay, PA   30 Units at 11/07/12 2214  . iron polysaccharides (NIFEREX) capsule 150 mg  150 mg Oral BID Wilburt Finlay, PA   150 mg at 11/07/12 2214  . levothyroxine (SYNTHROID, LEVOTHROID) tablet 62.5 mcg  62.5 mcg Oral Q48H Mihai Croitoru, MD   62.5 mcg at 11/07/12 0635  . levothyroxine (SYNTHROID, LEVOTHROID) tablet 75 mcg  75 mcg Oral Q48H Mihai Croitoru, MD   75 mcg at 11/08/12 0620  . linagliptin (TRADJENTA) tablet 5 mg  5 mg Oral Q breakfast Mihai Croitoru, MD   5 mg at 11/08/12 0621  . metoprolol tartrate (LOPRESSOR) tablet 37.5 mg  37.5 mg Oral BID Marykay Lex, MD   37.5 mg at 11/07/12 2214  . mupirocin ointment (BACTROBAN) 2 %   Topical BID Nada Boozer, NP      . nitroGLYCERIN (NITRODUR - Dosed in mg/24 hr) patch 0.4 mg  0.4 mg Transdermal PRN Wilburt Finlay, PA      . potassium chloride SA (K-DUR,KLOR-CON) CR tablet 20 mEq  20 mEq Oral BID WC Mihai Croitoru, MD   20 mEq at 11/08/12 0621  . tiotropium (SPIRIVA) inhalation capsule 18 mcg  18 mcg Inhalation Wynelle Cleveland, PA   18 mcg at 11/07/12 0942  . warfarin (COUMADIN)  tablet 2.5 mg  2.5 mg Oral Q48H Dennie Fetters, RPH   2.5 mg at 11/06/12 1707  . warfarin (COUMADIN) tablet 5 mg  5 mg Oral Q48H Mihai Croitoru, MD   5 mg at 11/07/12 1800  . Warfarin - Pharmacist Dosing Inpatient   Does not apply q1800 Mihai Croitoru, MD      . zolpidem (AMBIEN) tablet 5 mg  5 mg Oral QHS PRN Wilburt Finlay, PA   5 mg at 11/07/12 2341    PE: General appearance: alert, cooperative and no distress  Lungs: clear to auscultation bilaterally  Heart: regular rate and rhythm, S1, S2 normal, no murmur, click, rub or gallop  Extremities: 2+ LEE  Pulses: 2+ and symmetric  Skin: warm and dry.  Neurologic: Grossly normal    Lab Results:   [facility] 11/07/12 0633  WBC 5.8  HGB 11.1*  HCT 35.8*  PLT 187   BMET  Basename 11/08/12 0558 11/07/12 0633 11/06/12 0535  NA 136 139 139  K 4.3 4.1 4.2    CL 96 98 97  CO2 30 33* 30  GLUCOSE 158* 144* 125*  BUN 37* 37* 37*  CREATININE 1.69* 1.78* 1.62*  CALCIUM 9.3 9.1 9.5   PT/INR  Basename 11/07/12 0633  LABPROT 22.6*  INR 2.09*     Assessment/Plan  Principal Problem:  *Acute on chronic diastolic HF (heart failure) Active Problems:  OBESITY, MORBID  OBSTRUCTIVE SLEEP APNEA  HYPERTENSION  HYPERTENSION, PULMONARY  ATRIAL FIBRILLATION, HX OF  PACEMAKER, PERMANENT  Obesity hypoventilation syndrome  Thyroiditis, autoimmune  Cardiomyopathy, nonischemic  Type 2 diabetes mellitus  Plan:  Continues to diurese.  Held PM lasix last night and SCr improved this AM.  Will add PRN metolazone per Dr. Royann Shivers.  DC home today.  FU next week.  She already has an appointment.  BP and HR stable.   LOS: 5 days    [name], [name] 11/08/2012 9:05 AM  I seen and evaluated the patient this morning along with the PA/NP. I agree with their findings, examination as well as impression recommendations.  With her HF being mostly R sided, intermittent pauses in diuretic therapy may be necessary to re-equilibrate fluis balance.  Her BP & HR are stable & renal fxn stable -- continues to diurese on PO meds.  She seems to be on a good plan established by Dr. Royann Shivers.  I agree with d/c home today with close f/u & CMP check. On BB & Dig (level was OK) for rate control & warfarin for anticoagulation. -- H/H stable.  Marykay Lex, M.D., M.S. [facility] [address] [place], [place]  [address]  [phone] Pager # [phone] 11/08/2012 10:11 AM

## 2012-11-10 NOTE — Discharge Summary (Signed)
Physician Discharge Summary  Patient ID: [name] MRN: [id number] DOB/AGE: 03-05-1950 62 y.o.  Admit date: 11/03/2012 Discharge date: 11/10/2012  Admission Diagnoses:  Acute on chronic diastolic HF (heart failure)  Discharge Diagnoses:  Principal Problem:  *Acute on chronic diastolic HF (heart failure) Active Problems:  OBESITY, MORBID  OBSTRUCTIVE SLEEP APNEA  HYPERTENSION  HYPERTENSION, PULMONARY  ATRIAL FIBRILLATION, HX OF  PACEMAKER, PERMANENT  Obesity hypoventilation syndrome  Thyroiditis, autoimmune  Cardiomyopathy, nonischemic  Type 2 diabetes mellitus   Discharged Condition: stable  Hospital Course:   The patient is a 61 year old morbidly obese female with a history of chronic lower extremity edema, pacemaker implant for tachybradycardia syndrome, obstructive sleep apnea with CPAP, pulmonary hypertension, diabetes mellitus, hypothyroidism, SVT and PAF for which he takes amiodarone and Coumadin. Patient's last cardiac catheterization was in 2008 and revealed normal coronary arteries.  No evidence of coronary artery disease. No renal artery stenosis. Left ventricular dysfunction with ejection fraction of 46% and apical akinetic segment. Last 2-D echocardiogram was July 2013 showed an ejection fraction of 40-45% with mild global hypokinesis of the left ventricle. Right ventricle systolic pressure was 50-60 mm of mercury. At her last office visit with Dr. Caprice Kluver patient's weight was 340 pounds which is down 18 pounds from her previous visit in July. Her current weight at admission was 355.  She was admitted as an inpatient to telemetry for IV diuresis.  She responded well to lasix with a negative fluid balance of ~9 liters.  BNP decreased from 8028.0 to 3876.0.  She was discharged home in stable condition after being seen by Dr. Herbie Baltimore.   PRN metolazone was added.   Consults: None  Significant Diagnostic Studies:  BMET    Component Value Date/Time   NA 136  11/08/2012 0558   K 4.3 11/08/2012 0558   CL 96 11/08/2012 0558   CO2 30 11/08/2012 0558   GLUCOSE 158* 11/08/2012 0558   BUN 37* 11/08/2012 0558   CREATININE 1.69* 11/08/2012 0558   CREATININE 1.60* 10/27/2012 0800   CREATININE 1.28* 02/12/2011 1000   CALCIUM 9.3 11/08/2012 0558   CALCIUM 9.5 03/24/2012 1212   GFRNONAA 31* 11/08/2012 0558   GFRAA 36* 11/08/2012 0558   CBC    Component Value Date/Time   WBC 5.8 11/07/2012 0633   WBC 5.8 07/21/2009 1141   RBC 4.19 11/07/2012 0633   RBC 3.92 07/21/2009 1141   HGB 11.1* 11/07/2012 0633   HGB 11.9 07/21/2009 1141   HCT 35.8* 11/07/2012 0633   HCT 36.2 07/21/2009 1141   PLT 187 11/07/2012 0633   PLT 153 07/21/2009 1141   MCV 85.4 11/07/2012 0633   MCV 92.4 07/21/2009 1141   MCH 26.5 11/07/2012 0633   MCH 30.3 07/21/2009 1141   MCHC 31.0 11/07/2012 0633   MCHC 32.8 07/21/2009 1141   RDW 16.7* 11/07/2012 0633   RDW 16.5* 07/21/2009 1141   LYMPHSABS 1.1 08/11/2012 0900   LYMPHSABS 0.9 07/21/2009 1141   MONOABS 0.3 08/11/2012 0900   MONOABS 0.3 07/21/2009 1141   EOSABS 0.1 08/11/2012 0900   EOSABS 0.1 07/21/2009 1141   BASOSABS 0.0 08/11/2012 0900   BASOSABS 0.0 07/21/2009 1141    Treatments: IV lasix  Discharge Exam: Blood pressure 120/56, pulse 72, temperature 98.5 F (36.9 C), temperature source Oral, resp. rate 18, height 5\' 7"  (1.702 m), weight 159.848 kg (352 lb 6.4 oz), SpO2 99.00%.   Disposition: 01-Home or Self Care  Discharge Orders    Future Appointments: Provider: Department:  Dept Phone: Center:   12/15/2012 9:45 AM Waymon Budge, MD [facility] [phone] None   02/05/2013 1:30 PM David Stall, MD [facility] [phone] [facility]   03/23/2013 9:30 AM [facility] [phone] None   03/25/2013 10:30 AM Randall An, MD [facility] [phone] None     Future Orders Please Complete By Expires   Diet - low sodium heart healthy      Increase  activity slowly          Medication List     As of 11/10/2012  3:19 PM    TAKE these medications         albuterol 108 (90 BASE) MCG/ACT inhaler   Commonly known as: PROVENTIL HFA;VENTOLIN HFA   Inhale 2 puffs into the lungs every 6 (six) hours as needed. For shortness of breath      albuterol (2.5 MG/3ML) 0.083% nebulizer solution   Commonly known as: PROVENTIL   Take 2.5 mg by nebulization every 6 (six) hours as needed. For shortness of breath      ALPRAZolam 0.5 MG tablet   Commonly known as: XANAX   Take 0.25 mg by mouth 3 (three) times daily as needed. For anxiety      AMBIEN 10 MG tablet   Generic drug: zolpidem   Take 10 mg by mouth at bedtime as needed. sleep      amiodarone 200 MG tablet   Commonly known as: PACERONE   Take 1 tablet (200 mg total) by mouth 2 (two) times daily.      calcitRIOL 0.25 MCG capsule   Commonly known as: ROCALTROL   Take 1 capsule (0.25 mcg total) by mouth 2 (two) times daily.      calcitRIOL 0.25 MCG capsule   Commonly known as: ROCALTROL   Take 0.25 mcg by mouth 2 (two) times daily.      calcium citrate-vitamin D 200-200 MG-UNIT Tabs   Take 1 tablet by mouth 4 (four) times daily -  before meals and at bedtime.      digoxin 0.0625 mg Tabs   Commonly known as: LANOXIN   Take 0.5 tablets (0.0625 mg total) by mouth daily.      DULoxetine 30 MG capsule   Commonly known as: CYMBALTA   Take 30 mg by mouth 2 (two) times daily as needed. For pain      ergocalciferol 50000 UNITS capsule   Commonly known as: VITAMIN D2   Take 50,000 Units by mouth 2 (two) times a week. On Tues and Fri      furosemide 40 MG tablet   Commonly known as: LASIX   Take 1 tablet (40 mg total) by mouth 2 (two) times daily.      glyBURIDE 5 MG tablet   Commonly known as: DIABETA   Take 5-10 mg by mouth daily with breakfast. Take 10mg  in AM and 5mg  in PM      HYDROcodone-acetaminophen 10-650 MG per tablet   Commonly known as: LORCET   Take 1 tablet by  mouth every 8 (eight) hours as needed. For pain      insulin glargine 100 UNIT/ML injection   Commonly known as: LANTUS   Inject 30 Units into the skin at bedtime. Increase dose by one unit every 4 days until most AM BGs are in the 80-120 range.      iron polysaccharides 150 MG capsule   Commonly known as: NIFEREX  Take 1 capsule (150 mg total) by mouth 2 (two) times daily.      levothyroxine 75 MCG tablet   Commonly known as: SYNTHROID, LEVOTHROID   Take 75 mcg by mouth daily. Alternate with 62.5 mcg dose daily      levothyroxine 125 MCG tablet   Commonly known as: SYNTHROID, LEVOTHROID   Take 62.5 mcg by mouth See admin instructions. Alternate with daily      metolazone 5 MG tablet   Commonly known as: ZAROXOLYN   Take 1 tablet (5 mg total) by mouth as needed (For 24 hour wt gain >=3 pounds). For edema      metoprolol tartrate 12.5 mg Tabs   Commonly known as: LOPRESSOR   Take 1.5 tablets (37.5 mg total) by mouth 2 (two) times daily.      multivitamin tablet   Take 1 tablet by mouth daily.      mupirocin ointment 2 %   Commonly known as: BACTROBAN   Apply 1 application topically 2 (two) times daily. To leg blister      nitroGLYCERIN 0.4 mg/hr   Commonly known as: NITRODUR - Dosed in mg/24 hr   Place 1 patch onto the skin every other day.      nitroGLYCERIN 0.4 MG SL tablet   Commonly known as: NITROSTAT   Place 0.4 mg under the tongue every 5 (five) minutes as needed. For chest pain      potassium chloride SA 20 MEQ tablet   Commonly known as: K-DUR,KLOR-CON   Take 40 mEq by mouth daily.      sitaGLIPtin 100 MG tablet   Commonly known as: JANUVIA   Take 100 mg by mouth daily.      tiotropium 18 MCG inhalation capsule   Commonly known as: SPIRIVA   Place 18 mcg into inhaler and inhale every other day.      warfarin 5 MG tablet   Commonly known as: COUMADIN   Take 5 mg by mouth daily. 5 mg on even days. 2.5 mg on odd days  Pacemaker- complete heart  block and AF        Signed: [name], [name] 11/10/2012, 3:19 PM  I saw & evaluated the patient on the day of discharge along with the Mr. Leron Croak, [place]. I agree with their findings, examination as well as impression recommendations.  With her HF being mostly R sided, intermittent pauses in diuretic therapy may be necessary to re-equilibrate fluis balance.  Her BP & HR are stable & renal fxn stable -- continues to diurese on PO meds. She seems to be on a good plan established by Dr. Royann Shivers. I agree with d/c home today with close f/u & CMP check.  On BB & Dig (level was OK) for rate control & warfarin for anticoagulation. -- H/H stable.  Marykay Lex, M.D., M.S. [facility] [address] [place], [place]  [address]  [phone] Pager # [phone] 11/10/2012 6:48 PM

## 2012-11-12 ENCOUNTER — Encounter: Payer: Self-pay | Admitting: *Deleted

## 2012-12-04 ENCOUNTER — Other Ambulatory Visit: Payer: Self-pay | Admitting: *Deleted

## 2012-12-04 MED ORDER — GLYBURIDE 5 MG PO TABS: 5.0000 mg | ORAL_TABLET | Freq: Every day | ORAL | Status: DC

## 2012-12-08 ENCOUNTER — Other Ambulatory Visit: Payer: Self-pay

## 2012-12-08 MED ORDER — HYDROCODONE-ACETAMINOPHEN 10-325 MG PO TABS: 1.0000 | ORAL_TABLET | Freq: Three times a day (TID) | ORAL | Status: DC | PRN

## 2012-12-15 ENCOUNTER — Ambulatory Visit: Payer: Medicare Other | Admitting: Internal Medicine

## 2013-01-03 ENCOUNTER — Other Ambulatory Visit: Payer: Self-pay | Admitting: "Endocrinology

## 2013-01-05 ENCOUNTER — Other Ambulatory Visit: Payer: Self-pay | Admitting: *Deleted

## 2013-01-05 MED ORDER — GLYBURIDE 5 MG PO TABS: ORAL_TABLET | ORAL | Status: DC

## 2013-01-05 MED ORDER — GLYBURIDE 5 MG PO TABS: 5.0000 mg | ORAL_TABLET | Freq: Every day | ORAL | Status: DC

## 2013-01-08 ENCOUNTER — Other Ambulatory Visit: Payer: Self-pay | Admitting: *Deleted

## 2013-01-08 MED ORDER — GLUCOSE BLOOD VI STRP: ORAL_STRIP | Status: DC

## 2013-01-13 ENCOUNTER — Ambulatory Visit: Payer: Medicare Other | Admitting: Internal Medicine

## 2013-01-22 ENCOUNTER — Telehealth: Payer: Self-pay | Admitting: *Deleted

## 2013-01-22 NOTE — Telephone Encounter (Signed)
We have received a request from [employer] informing Dr. Fransico Michael that based on screening claims, [name] might be a candidate for a statin drug and would he consider prescribing it.  I left a voice mail message on pt's cell phone requesting her to call me.  I tried her home phone but no answer and no voice mail.

## 2013-01-23 ENCOUNTER — Telehealth: Payer: Self-pay | Admitting: *Deleted

## 2013-01-23 NOTE — Telephone Encounter (Signed)
[name] left me a voice mail on 01/22/13 stating that she was previously on a statin but her other doctor took her off it because she was having extreme muscle pain. I tried to contact [employer] at [phone], to give them this information, but could not get to a "human".  Dr. Fransico Michael is aware.

## 2013-01-30 ENCOUNTER — Encounter: Payer: Self-pay | Admitting: Obstetrics and Gynecology

## 2013-01-30 ENCOUNTER — Ambulatory Visit: Payer: Medicare Other | Admitting: Obstetrics and Gynecology

## 2013-01-30 VITALS — BP 110/62 | Ht 67.0 in | Wt 338.0 lb

## 2013-01-30 DIAGNOSIS — N8502 Endometrial intraepithelial neoplasia [EIN]: Secondary | ICD-10-CM

## 2013-01-30 HISTORY — PX: IUD REMOVAL: SHX5392

## 2013-01-30 NOTE — Addendum Note (Signed)
Addended by: Marla Roe A on: 01/30/2013 03:38 PM   Modules accepted: Orders

## 2013-01-30 NOTE — Progress Notes (Signed)
Pt is retaining fluid with mirena and cardiologist recommended she have it removed.  It has been in place since May 2013 for tx of em hyperplasia with atypia.  Filed Vitals:   01/30/13 1114  BP: 110/62   IUD removed EMBX performed without difficulty after prep  Pipelle passed x 3 to 8cm  A/P rec repeat em bx to see how tx has progressed expecially since now removing IUD rec rto 1-2wks to f/u results IUD removed without difficulty If still has hyperplasia, I rec reconsult with gyn onc for recs because pt is not a candidate for pills or mirena secondary to recurrent CHF because of fluid retention and she is also not a good surgical candidate

## 2013-02-03 LAB — PATHOLOGY

## 2013-02-05 ENCOUNTER — Ambulatory Visit (INDEPENDENT_AMBULATORY_CARE_PROVIDER_SITE_OTHER): Payer: Medicare Other | Admitting: "Endocrinology

## 2013-02-05 ENCOUNTER — Other Ambulatory Visit: Payer: Self-pay | Admitting: *Deleted

## 2013-02-05 ENCOUNTER — Encounter: Payer: Self-pay | Admitting: "Endocrinology

## 2013-02-05 VITALS — BP 127/90 | HR 81 | Wt 339.0 lb

## 2013-02-05 DIAGNOSIS — E1169 Type 2 diabetes mellitus with other specified complication: Secondary | ICD-10-CM

## 2013-02-05 DIAGNOSIS — E038 Other specified hypothyroidism: Secondary | ICD-10-CM

## 2013-02-05 DIAGNOSIS — E063 Autoimmune thyroiditis: Secondary | ICD-10-CM

## 2013-02-05 DIAGNOSIS — E049 Nontoxic goiter, unspecified: Secondary | ICD-10-CM

## 2013-02-05 DIAGNOSIS — E11649 Type 2 diabetes mellitus with hypoglycemia without coma: Secondary | ICD-10-CM

## 2013-02-05 MED ORDER — LEVOTHYROXINE SODIUM 125 MCG PO TABS: 62.5000 ug | ORAL_TABLET | ORAL | Status: DC

## 2013-02-05 MED ORDER — LEVOTHYROXINE SODIUM 75 MCG PO TABS: 75.0000 ug | ORAL_TABLET | Freq: Every day | ORAL | Status: DC

## 2013-02-05 MED ORDER — MUPIROCIN 2 % EX OINT: 1.0000 "application " | TOPICAL_OINTMENT | Freq: Two times a day (BID) | CUTANEOUS | Status: DC

## 2013-02-05 NOTE — Patient Instructions (Signed)
Follow up visit in 3 months. Bring in meter for download.

## 2013-02-05 NOTE — Progress Notes (Signed)
Subjective:  Patient Name: [name] Date of Birth: [birth year]  MRN: [id number]  [name] Daise  presents to the office today for follow-up of her insulin-requiring T2DM, leg edema, left shin ulcer, hypertension, thyroiditis, hypothyroidism, and edema.  HISTORY OF PRESENT ILLNESS:   [name] is a 63 y.o. Caucasian woman. [name] was unaccompanied.  1.The patient was first referred to me on 10/30/05 by her primary care provider, Dr. Sharlet Salina, for evaluation and co-management of her diabetes and obesity. I have followed her for the above problems since then. Please see my clinic note from 06/06/11 for detailed description of the course of her type 2 diabetes mellitus, hypothyroidism secondary to Hashimoto's thyroiditis, and hyperparathyroidism secondary to deficiency of calcium intake, vitamin D intake, and renal conversion of 25-hydroxy vitamin D to 1, 25-dihydroxy vitamin D.  2. At her clinic visit on 03/27/12 she had a left shin ulcer that was open and draining. Her HbA1c from 03/24/12 was 8.0%. Her oral anti-hyperglycemic medications, Januvia, 100 mg/day, and glyburide, 10 mg twice a day, were not adequately controlling her BGs. I finally convinced her to add Lantus insulin, at a dose of 4 units each evening. I also coordinated with Dr. Lenord Fellers and started the patient on topical Bactroban and oral doxycycline. We have gradually but progressively increased her Lantus dose since then and her BGs have improved substantially.  3. The patient's last [facility] visit was on 10/15/12.   A. In the interim, she felt that the IUD was causing her to retain excess tissue fluid. She saw Dr. Su Hilt last Friday, 01/30/13. She had her IUD removed and then had an endometrial biopsy. A few hours after the procedure she developed a large blister in the skin several cm to the left of the umbilicus. The blister soon burst, leaving behind a open wound. She treated with hydrocortisone.   B. On 11/03/12 she was  admitted to [facility] for 6 days for CHF secondary to A-Fib. She diuresed a significant amount of fluid. Her amiodarone was increased from 300 mg/day to 200 mg, twice daily. She is due for an elective cardioversion tomorrow, if she decides to go through with it. When she was in the hospital she was taken off Lantus and started on Levemir. Januvia was discontinued and she was treated with a different medication. She resumed Lantus and Januvia when she was discharged.  C. On 12/9 her ceiling fell in on her and her husband. They received several  injuries, to include many puncture wounds in the legs. These puncture wounds are still healing. She and her husband had to be out of the home for more than 6 weeks.   D. She is now taking 30 units of Lantus each evening. She also takes glyburide, 10 mg at breakfast and 5 mg at supper and Januvia, 100 each AM.  She has not had any more problems with hypoglycemia.   4. Pertinent Review of Systems:  Constitutional: The patient generally feels very short of breath when walking. Her energy is not as good recently. She sleeps well with her CPAP and oxygen at 2 liters. Eyes: Vision is not as good as it was. She needs to have well-lit environments. She has not had any more stys. There are no other significant eye complaints. She has an appointment with Dr. Luciana Axe soon. Neck: The patient has nor had had anymore anterior neck swelling, soreness, tenderness,  pressure, and discomfort in the past few months.   Heart: She has A-fib frequently. Her  pacemaker is still working. She still has chest pains when her HR is too high.  Lungs: She has had intermittent wheezing.  Gastrointestinal: She has not had any further nausea.  Bowel movents seem normal. She has not had much abdominal pain recently.   Legs: Muscle mass and strength seem normal. There are no complaints of numbness, tingling, burning, or pain. She has the healing puncture wounds as noted above. Edema is increased again in  both legs.  Feet: There are no obvious foot problems. There are no complaints of numbness or pain. She has more foot swelling. She does have tingling and burning when the feet are more swollen. Feet are also colder then.  GYN: As above Hypoglycemia: Lowest BG was 85. She was symptomatic than.  5. BG printout: She forgot to bring her meter.   PAST MEDICAL, FAMILY, AND SOCIAL HISTORY:  Past Medical History  Diagnosis Date  . Personal history of other diseases of circulatory system   . Cardiac pacemaker in situ   . Chronic lymphocytic thyroiditis   . Morbid obesity   . Other chronic pulmonary heart diseases   . Obstructive sleep apnea (adult) (pediatric)   . Other and unspecified hyperlipidemia   . Fibromyalgia   . Goiter   . Thyroiditis, autoimmune   . Hypothyroidism, acquired, autoimmune   . Solitary kidney, acquired   . Fatigue   . Hyperparathyroidism, secondary   . Vitamin D deficiency disease   . Hyperparathyroidism   . H/O varicose veins 03/27/12  . Type II or unspecified type diabetes mellitus without mention of complication, not stated as uncontrolled   . Diabetes mellitus type II   . Infections of kidney 03/27/12  . History of measles, mumps, or rubella 03/27/12  . Tubal pregnancy 12/23/76  . Unspecified essential hypertension     20 yrs ago  . Blood transfusion 1977    [facility]  . Complication of anesthesia 2010    hard to wake up after knee surgery  . Anemia   . Cardiomyopathy, nonischemic   . Endometrial hyperplasia with atypia 05/05/12  . Diastolic HF (heart failure) 11/05/2012    Family History  Problem Relation Age of Onset  . Cardiomyopathy Brother   . Diabetes Mother   . Cancer Maternal Grandmother     Current outpatient prescriptions:albuterol (PROVENTIL HFA;VENTOLIN HFA) 108 (90 BASE) MCG/ACT inhaler, Inhale 2 puffs into the lungs every 6 (six) hours as needed. For shortness of breath, Disp: , Rfl: ;  albuterol (PROVENTIL) (2.5 MG/3ML) 0.083% nebulizer  solution, Take 2.5 mg by nebulization every 6 (six) hours as needed. For shortness of breath, Disp: , Rfl:  ALPRAZolam (XANAX) 0.5 MG tablet, Take 0.25 mg by mouth 3 (three) times daily as needed. For anxiety, Disp: , Rfl: ;  amiodarone (CORDARONE) 200 MG tablet, Take 1 tablet (200 mg total) by mouth 2 (two) times daily., Disp: 60 tablet, Rfl: 5;  calcitRIOL (ROCALTROL) 0.25 MCG capsule, Take 0.25 mcg by mouth 2 (two) times daily., Disp: , Rfl:  calcium citrate-vitamin D 200-200 MG-UNIT TABS, Take 1 tablet by mouth 4 (four) times daily -  before meals and at bedtime., Disp: , Rfl: ;  digoxin (LANOXIN) 0.0625 mg TABS, Take 0.5 tablets (0.0625 mg total) by mouth daily., Disp: 30 tablet, Rfl: 5;  DULoxetine (CYMBALTA) 30 MG capsule, Take 30 mg by mouth 2 (two) times daily as needed. For pain, Disp: , Rfl:  ergocalciferol (VITAMIN D2) 50000 UNITS capsule, Take 50,000 Units by mouth 2 (two) times  a week. On Tues and Fri, Disp: , Rfl: ;  glucose blood (ONE TOUCH ULTRA TEST) test strip, Check sugar 6 x daily and per Protocols for Hypoglycemia & Hyperglycemia., Disp: 200 each, Rfl: 5;  glyBURIDE (DIABETA) 5 MG tablet, Take 2 tablets in AM with breakfast and 1 tablet in PM, Disp: 90 tablet, Rfl: 6 HYDROcodone-acetaminophen (NORCO) 10-325 MG per tablet, Take 1 tablet by mouth every 8 (eight) hours as needed for pain., Disp: 90 tablet, Rfl: 5;  insulin glargine (LANTUS) 100 UNIT/ML injection, Inject 30 Units into the skin at bedtime. Increase dose by one unit every 4 days until most AM BGs are in the 80-120 range., Disp: , Rfl:  iron polysaccharides (NIFEREX) 150 MG capsule, Take 1 capsule (150 mg total) by mouth 2 (two) times daily., Disp: 30 capsule, Rfl: 3;  levothyroxine (SYNTHROID, LEVOTHROID) 125 MCG tablet, Take 62.5 mcg by mouth See admin instructions. Alternate with daily, Disp: , Rfl: ;  levothyroxine (SYNTHROID, LEVOTHROID) 75 MCG tablet, Take 75 mcg by mouth daily. Alternate with 62.5 mcg dose daily,  Disp: , Rfl:  metolazone (ZAROXOLYN) 5 MG tablet, Take 1 tablet (5 mg total) by mouth as needed (For 24 hour wt gain >=3 pounds). For edema, Disp: 30 tablet, Rfl: 5;  metoprolol tartrate (LOPRESSOR) 12.5 mg TABS, Take 1.5 tablets (37.5 mg total) by mouth 2 (two) times daily., Disp: 90 tablet, Rfl: 5;  Multiple Vitamin (MULTIVITAMIN) tablet, Take 1 tablet by mouth daily. , Disp: , Rfl:  mupirocin ointment (BACTROBAN) 2 %, Apply 1 application topically 2 (two) times daily. To leg blister, Disp: , Rfl: ;  nitroGLYCERIN (NITRODUR - DOSED IN MG/24 HR) 0.4 mg/hr, Place 1 patch onto the skin every other day., Disp: , Rfl: ;  nitroGLYCERIN (NITROSTAT) 0.4 MG SL tablet, Place 0.4 mg under the tongue every 5 (five) minutes as needed. For chest pain, Disp: , Rfl:  potassium chloride SA (K-DUR,KLOR-CON) 20 MEQ tablet, Take 40 mEq by mouth daily., Disp: , Rfl: ;  sitaGLIPtin (JANUVIA) 100 MG tablet, Take 100 mg by mouth daily., Disp: , Rfl: ;  tiotropium (SPIRIVA) 18 MCG inhalation capsule, Place 18 mcg into inhaler and inhale every other day. , Disp: , Rfl: ;  Torsemide (DEMADEX PO), Take by mouth., Disp: , Rfl:  Vitamin D, Ergocalciferol, (DRISDOL) 50000 UNITS CAPS, TAKE 1 CAPSULE BY MOUTH TWICE A WEEK (ON TUESDAYS AND FRIDAYS), Disp: 9 capsule, Rfl: 4;  warfarin (COUMADIN) 5 MG tablet, Take 5 mg by mouth daily. 5 mg on even days. 2.5 mg on odd days Pacemaker- complete heart block and AF, Disp: , Rfl: ;  zolpidem (AMBIEN) 10 MG tablet, Take 10 mg by mouth at bedtime as needed. sleep, Disp: , Rfl:  calcitRIOL (ROCALTROL) 0.25 MCG capsule, Take 1 capsule (0.25 mcg total) by mouth 2 (two) times daily., Disp: 60 capsule, Rfl: 6;  furosemide (LASIX) 40 MG tablet, Take 1 tablet (40 mg total) by mouth 2 (two) times daily., Disp: 60 tablet, Rfl: 5  Allergies as of 02/05/2013 - Review Complete 02/05/2013  Allergen Reaction Noted  . Ivp dye (iodinated diagnostic agents) Shortness Of Breath 05/19/2012  . Diphenhydramine hcl  Swelling   . Fish allergy Nausea And Vomiting 05/29/2011  . Iohexol  07/19/2005  . Penicillins Other (See Comments)   . Povidone Other (See Comments)   . Promethazine hcl Other (See Comments)   . Tape  08/06/2011  . Clindamycin Hives and Rash 05/17/2008  . Iodine Rash 05/21/2012  . Morphine  sulfate Nausea And Vomiting and Rash     1. Work and Family: She spends a lot of her time back and forth to her many physicians. 2. Activities: Same 3. Smoking, alcohol, or drugs: none 4. Primary Care Provider: Margaree Mackintosh, MD 5. Cardiologist: Dr. Rachelle Hora Croitoru 6. GYN: Dr. Osborn Coho 7. GYN oncologist: Dr. De Blanch  REVIEW OF SYSTEMS: There are no other significant problems involving Carletha's other body systems.   Objective:  Vital Signs:  BP 127/90  Pulse 81  Wt 339 lb (153.769 kg)   Ht Readings from Last 3 Encounters:  01/30/13 5\' 7"  (1.702 m)  11/03/12 5\' 7"  (1.702 m)  08/26/12 5\' 7"  (1.702 m)   Wt Readings from Last 3 Encounters:  02/05/13 339 lb (153.769 kg)  01/30/13 338 lb (153.316 kg)  11/08/12 352 lb 6.4 oz (159.848 kg)    There is no height on file to calculate BSA.  PHYSICAL EXAM:  Constitutional: The patient appears healthy and well nourished. She is very alert and bright today, but seems somewhat more tired. She has regained 6 pounds since last visit.  Face: The face appears normal.  Eyes: There is no obvious arcus or proptosis. Moisture appears normal. Mouth: The oropharynx appears normal. Her mouth and tongue are dry. Neck: The neck appears to be visibly normal. No carotid bruits are noted. The thyroid gland is 18-20 grams in size. The consistency of the thyroid gland is normal. The thyroid gland is not tender to palpation today . Lungs: The lungs are clear to auscultation. Air movement is good. Heart: Heart rate and rhythm are occasionally irregular. Heart sounds S1 and S2 are normal.  Abdomen: The abdomen is quite enlarged. Bowel sounds are  normal. There is no obvious hepatomegaly, splenomegaly, or other mass effect. She has about a 2 cm healing superficial ulcer of the left mid-abdomen. Arms: Muscle size and bulk are normal for age. Hands: There is no obvious tremor. Phalangeal and metacarpophalangeal joints are normal. Palmar muscles are normal. Palmar skin is normal. Palmar moisture is also normal. Legs: Muscles appear normal for age. Leg edema is significantly increased again to 3+.  Feet: Feet are normally formed. She has 3+ pitting edema of the dorsa.  Neurologic: Strength is normal for age in the arms, but decreased in the hip flexors. Sensation to touch is normal in both legs and dorsa of the feet.  LAB DATA:  Recent Results (from the past 504 hour(s))  PATHOLOGY   Collection Time   01/30/13 12:00 PM      Component Value Range   Report      GLUCOSE, POCT (MANUAL RESULT ENTRY)   Collection Time   02/05/13  1:39 PM      Component Value Range   POC Glucose 157 (*) 70 - 99 mg/dl  POCT GLYCOSYLATED HEMOGLOBIN (HGB A1C)   Collection Time   02/05/13  1:39 PM      Component Value Range   Hemoglobin A1C 8.0    Her HbA1c is 8.0% today, compared with 7.1% at last visit. The higher A1c reflects higher BGs during the past 2-3 months. Labs: 10/27/12: TSH 3.745, free T4 2.12, free T3 2.6; CMP: Cr 1.4; urinary microalbumin/creatinine concentration 89.6   Assessment and Plan:   ASSESSMENT:  1. Insulin-requiring T2DM:  The patient's BGs are somewhat worse since last visit. It's unclear what the cause is.  Her subjective symptoms have not changed. I need to see her BG data to determine how to best  adjust her meds. 2. Hypoglycemia/neuroglycopenia: The patient has occasional hypoglycemia without an obvious cause.  Although the Januvia will not cause hypoglycemia, the combination of Lantus and glyburide may well do so.    3. A-fib: The arrhythmia comes and goes of its own volition. She is due for cardio-version tomorrow.  4. Leg edema:  Edema is worse.  5. Hypertension: BPs are worse again, probably due to the excess fluid. 6. Abdominal pains: Her abdominal pains have essentially resolved.   7. Iron deficiency: Dr. Lenord Fellers put her on iron. We'll see how she does.  8. Thyroiditis: Clinically quiescent today.  Her TFTs in October were "screwy" in that they did not make sense innately. When such tests occur, they are usually associated with either a flare up of Hashimoto's disease, which she was having at the time, or due to amiodarone. We need to repeat her TFTs. 9. Fatigue: Although anemia due to iron deficiency is a leading cause of fatigue, hypothyroidism must be re-evaluated.  PLAN:  1. Diagnostic: TFTs. Check BGs three times a day, before breakfast, before supper, and at bedtime. Bring in BG meter in the coming month for download.  2. Therapeutic:   A. Watch salt intake tightly.   B. Continue Lantus, glyburide, and Januvia at their current doses.   Follow the Very Small scale HS snack plan.  Will consider increasing the Lantus to 31-32 units in one month. Call your cardiologis re increased edema.  3. Patient education: Until she can lose significant amounts of weight, she will need to continue the insulin and oral agent therapies.  4. Follow-up: 3 months  Level of Service: This visit lasted in excess of 60 minutes. More than 50% of the visit was devoted to counseling.  David Stall, MD 02/05/2013 1:47 PM

## 2013-02-06 ENCOUNTER — Ambulatory Visit (HOSPITAL_COMMUNITY)
Admission: RE | Admit: 2013-02-06 | Discharge: 2013-02-06 | Disposition: A | Discharge status: Home / Self Care | Payer: Medicare Other | Source: Ambulatory Visit | Attending: Cardiovascular Disease | Admitting: Cardiovascular Disease

## 2013-02-06 ENCOUNTER — Encounter (HOSPITAL_COMMUNITY): Payer: Self-pay | Admitting: Anesthesiology

## 2013-02-06 ENCOUNTER — Encounter (HOSPITAL_COMMUNITY): Payer: Self-pay | Admitting: *Deleted

## 2013-02-06 ENCOUNTER — Encounter (HOSPITAL_COMMUNITY)
Admission: RE | Disposition: A | Discharge status: Home / Self Care | Payer: Self-pay | Source: Ambulatory Visit | Attending: Cardiovascular Disease

## 2013-02-06 ENCOUNTER — Ambulatory Visit (HOSPITAL_COMMUNITY): Payer: Medicare Other | Admitting: Anesthesiology

## 2013-02-06 DIAGNOSIS — I4891 Unspecified atrial fibrillation: Secondary | ICD-10-CM | POA: Insufficient documentation

## 2013-02-06 HISTORY — PX: CARDIOVERSION: SHX1299

## 2013-02-06 LAB — GLUCOSE, CAPILLARY: Glucose-Capillary: 119 mg/dL — ABNORMAL HIGH (ref 70–99)

## 2013-02-06 SURGERY — CARDIOVERSION
Anesthesia: Monitor Anesthesia Care

## 2013-02-06 MED ORDER — SODIUM CHLORIDE 0.9 % IV SOLN
INTRAVENOUS | Status: DC | PRN
  Administered 2013-02-06: 12:00:00 via INTRAVENOUS

## 2013-02-06 MED ORDER — SODIUM CHLORIDE 0.9 % IV SOLN: INTRAVENOUS | Status: DC

## 2013-02-06 MED ORDER — PROPOFOL 10 MG/ML IV BOLUS
INTRAVENOUS | Status: DC | PRN
  Administered 2013-02-06: 100 mg via INTRAVENOUS

## 2013-02-06 MED ORDER — LIDOCAINE HCL 2 % IJ SOLN
INTRAMUSCULAR | Status: AC
  Filled 2013-02-06: qty 20

## 2013-02-06 MED ORDER — HYDROCORTISONE 1 % EX CREA: 1.0000 "application " | TOPICAL_CREAM | Freq: Three times a day (TID) | CUTANEOUS | Status: DC | PRN

## 2013-02-06 MED ORDER — SODIUM CHLORIDE 0.9 % IJ SOLN: 3.0000 mL | INTRAMUSCULAR | Status: DC | PRN

## 2013-02-06 NOTE — Anesthesia Preprocedure Evaluation (Addendum)
Anesthesia Evaluation  Patient identified by MRN, date of birth, ID band Patient awake    Airway Mallampati: I TM Distance: >3 FB Neck ROM: full    Dental   Pulmonary sleep apnea ,          Cardiovascular hypertension, +CHF + pacemaker Rhythm:irregular Rate:Normal     Neuro/Psych  Neuromuscular disease    GI/Hepatic   Endo/Other  diabetes, Type 2Hypothyroidism Morbid obesity  Renal/GU Renal disease     Musculoskeletal  (+) Fibromyalgia -  Abdominal   Peds  Hematology   Anesthesia Other Findings   Reproductive/Obstetrics                          Anesthesia Physical Anesthesia Plan  ASA: III  Anesthesia Plan: General   Post-op Pain Management:    Induction: Intravenous  Airway Management Planned: Mask  Additional Equipment:   Intra-op Plan:   Post-operative Plan:   Informed Consent: I have reviewed the patients History and Physical, chart, labs and discussed the procedure including the risks, benefits and alternatives for the proposed anesthesia with the patient or authorized representative who has indicated his/her understanding and acceptance.     Plan Discussed with: CRNA, Anesthesiologist and Surgeon  Anesthesia Plan Comments:         Anesthesia Quick Evaluation

## 2013-02-06 NOTE — H&P (Signed)
Date of Initial H&P: 01/19/13  History reviewed, patient examined, no change in status, stable for surgery. Here for elective cardioversion for symptomatic atrial fibrillation. Thurmon Fair, MD, [facility] [phone] office [phone] pager

## 2013-02-06 NOTE — CV Procedure (Signed)
Skora,Ninnie Reece Agar Female, 63 y.o., [birth year]  Location: [facility] ENDOSCOPY  Bed: NONE  MRN: [id number]  CSN: [id number]  Admit Dt: 02/06/13  Procedure: Electrical Cardioversion Indications:  Atrial Fibrillation  Procedure Details:  Consent: Risks of procedure as well as the alternatives and risks of each were explained to the (patient/caregiver).  Consent for procedure obtained.  Time Out: Verified patient identification, verified procedure, site/side was marked, verified correct patient position, special equipment/implants available, medications/allergies/relevent history reviewed, required imaging and test results available.  Performed  Patient placed on cardiac monitor, pulse oximetry, supplemental oxygen as necessary.  Sedation given: Propofol 140 mg IV, Dr. Katrinka Blazing Pacer pads placed anterior and posterior chest.  Cardioverted 1 time(s).  Cardioverted at 150J.  Evaluation: Findings: Post procedure EKG shows: AV sequential paced rhythm Complications: None Patient did tolerate procedure well.  Time Spent Directly with the Patient:  30 minutes   Iren Whipp 02/06/2013, 12:45 PM

## 2013-02-06 NOTE — Transfer of Care (Signed)
Immediate Anesthesia Transfer of Care Note  Patient: [name]  Procedure(s) Performed: Procedure(s) (LRB) with comments: CARDIOVERSION (N/A)  Patient Location: PACU  Anesthesia Type:General  Level of Consciousness: awake, alert  and oriented  Airway & Oxygen Therapy: Patient Spontanous Breathing and Patient connected to nasal cannula oxygen  Post-op Assessment: Report given to PACU RN, Post -op Vital signs reviewed and stable and Patient moving all extremities  Post vital signs: Reviewed and stable  Complications: No apparent anesthesia complications

## 2013-02-06 NOTE — Preoperative (Signed)
Beta Blockers   Reason not to administer Beta Blockers:Not Applicable 

## 2013-02-06 NOTE — Anesthesia Postprocedure Evaluation (Signed)
  Anesthesia Post-op Note  Patient: [name]  Procedure(s) Performed: Procedure(s) (LRB) with comments: CARDIOVERSION (N/A)  Patient Location: PACU  Anesthesia Type:General  Level of Consciousness: awake, alert , oriented and patient cooperative  Airway and Oxygen Therapy: Patient Spontanous Breathing  Post-op Pain: none  Post-op Assessment: Post-op Vital signs reviewed, Patient's Cardiovascular Status Stable, Respiratory Function Stable, Patent Airway, No signs of Nausea or vomiting and Pain level controlled  Post-op Vital Signs: stable  Complications: No apparent anesthesia complications

## 2013-02-09 ENCOUNTER — Encounter (HOSPITAL_COMMUNITY): Payer: Self-pay | Admitting: Cardiovascular Disease

## 2013-02-20 ENCOUNTER — Encounter: Payer: Medicare Other | Admitting: Obstetrics and Gynecology

## 2013-02-26 ENCOUNTER — Ambulatory Visit: Payer: Medicare Other | Admitting: Internal Medicine

## 2013-03-19 ENCOUNTER — Ambulatory Visit: Payer: Self-pay | Admitting: Cardiovascular Disease

## 2013-03-19 DIAGNOSIS — I4891 Unspecified atrial fibrillation: Secondary | ICD-10-CM

## 2013-03-19 DIAGNOSIS — Z7901 Long term (current) use of anticoagulants: Secondary | ICD-10-CM | POA: Insufficient documentation

## 2013-03-19 DIAGNOSIS — I480 Paroxysmal atrial fibrillation: Secondary | ICD-10-CM

## 2013-03-19 DIAGNOSIS — I4819 Other persistent atrial fibrillation: Secondary | ICD-10-CM | POA: Insufficient documentation

## 2013-03-19 HISTORY — DX: Paroxysmal atrial fibrillation: I48.0

## 2013-03-23 ENCOUNTER — Encounter (HOSPITAL_COMMUNITY): Payer: Medicare Other | Attending: Oncology

## 2013-03-23 DIAGNOSIS — E611 Iron deficiency: Secondary | ICD-10-CM

## 2013-03-23 DIAGNOSIS — D472 Monoclonal gammopathy: Secondary | ICD-10-CM | POA: Insufficient documentation

## 2013-03-23 DIAGNOSIS — D509 Iron deficiency anemia, unspecified: Secondary | ICD-10-CM | POA: Insufficient documentation

## 2013-03-23 DIAGNOSIS — E119 Type 2 diabetes mellitus without complications: Secondary | ICD-10-CM | POA: Insufficient documentation

## 2013-03-23 DIAGNOSIS — I509 Heart failure, unspecified: Secondary | ICD-10-CM | POA: Insufficient documentation

## 2013-03-23 LAB — CBC WITH DIFFERENTIAL/PLATELET
Hemoglobin: 12.3 g/dL (ref 12.0–15.0)
Lymphocytes Relative: 15 % (ref 12–46)
Lymphs Abs: 1 10*3/uL (ref 0.7–4.0)
Monocytes Relative: 5 % (ref 3–12)
Neutro Abs: 5.2 10*3/uL (ref 1.7–7.7)
Neutrophils Relative %: 80 % — ABNORMAL HIGH (ref 43–77)
Platelets: 150 10*3/uL (ref 150–400)
RBC: 4.32 MIL/uL (ref 3.87–5.11)
WBC: 6.6 10*3/uL (ref 4.0–10.5)

## 2013-03-23 LAB — COMPREHENSIVE METABOLIC PANEL
ALT: 19 U/L (ref 0–35)
Alkaline Phosphatase: 100 U/L (ref 39–117)
BUN: 53 mg/dL — ABNORMAL HIGH (ref 6–23)
CO2: 34 mEq/L — ABNORMAL HIGH (ref 19–32)
Chloride: 91 mEq/L — ABNORMAL LOW (ref 96–112)
GFR calc Af Amer: 31 mL/min — ABNORMAL LOW (ref >90)
GFR calc non Af Amer: 27 mL/min — ABNORMAL LOW (ref >90)
Glucose, Bld: 240 mg/dL — ABNORMAL HIGH (ref 70–99)
Potassium: 3.8 mEq/L (ref 3.5–5.1)
Sodium: 137 mEq/L (ref 135–145)
Total Bilirubin: 0.9 mg/dL (ref 0.3–1.2)
Total Protein: 7.9 g/dL (ref 6.0–8.3)

## 2013-03-23 NOTE — Progress Notes (Signed)
Labs drawn today for cbc/diff,cmp,kllc,mm panel 

## 2013-03-25 ENCOUNTER — Encounter (HOSPITAL_BASED_OUTPATIENT_CLINIC_OR_DEPARTMENT_OTHER): Payer: Medicare Other | Admitting: Oncology

## 2013-03-25 ENCOUNTER — Other Ambulatory Visit: Payer: Self-pay | Admitting: *Deleted

## 2013-03-25 ENCOUNTER — Encounter (HOSPITAL_COMMUNITY): Payer: Self-pay | Admitting: Oncology

## 2013-03-25 VITALS — BP 140/93 | HR 73 | Temp 97.2°F | Resp 18 | Wt 322.0 lb

## 2013-03-25 DIAGNOSIS — I2789 Other specified pulmonary heart diseases: Secondary | ICD-10-CM

## 2013-03-25 DIAGNOSIS — N2581 Secondary hyperparathyroidism of renal origin: Secondary | ICD-10-CM

## 2013-03-25 DIAGNOSIS — I509 Heart failure, unspecified: Secondary | ICD-10-CM

## 2013-03-25 DIAGNOSIS — D509 Iron deficiency anemia, unspecified: Secondary | ICD-10-CM

## 2013-03-25 DIAGNOSIS — D472 Monoclonal gammopathy: Secondary | ICD-10-CM

## 2013-03-25 DIAGNOSIS — D500 Iron deficiency anemia secondary to blood loss (chronic): Secondary | ICD-10-CM

## 2013-03-25 LAB — MULTIPLE MYELOMA PANEL, SERUM
Alpha-2-Globulin: 9.4 % (ref 7.1–11.8)
Beta 2: 13 % — ABNORMAL HIGH (ref 3.2–6.5)
Beta Globulin: 8.4 % — ABNORMAL HIGH (ref 4.7–7.2)
IgA: 953 mg/dL — ABNORMAL HIGH (ref 69–380)
IgM, Serum: 77 mg/dL (ref 52–322)
M-Spike, %: 0.54 g/dL
Total Protein: 7.5 g/dL (ref 6.0–8.3)

## 2013-03-25 LAB — IRON AND TIBC
Iron: 66 ug/dL (ref 42–135)
TIBC: 468 ug/dL (ref 250–470)
UIBC: 402 ug/dL — ABNORMAL HIGH (ref 125–400)

## 2013-03-25 LAB — FERRITIN: Ferritin: 21 ng/mL (ref 10–291)

## 2013-03-25 MED ORDER — CALCITRIOL 0.25 MCG PO CAPS: 0.2500 ug | ORAL_CAPSULE | Freq: Two times a day (BID) | ORAL | Status: DC

## 2013-03-25 NOTE — Progress Notes (Signed)
[name] presented for labwork. Labs per MD order drawn via Peripheral Line 23 gauge needle inserted in right AC  Good blood return present. Procedure without incident.  Needle removed intact. Patient tolerated procedure well.

## 2013-03-25 NOTE — Patient Instructions (Addendum)
[facility] Discharge Instructions  RECOMMENDATIONS MADE BY THE CONSULTANT AND ANY TEST RESULTS WILL BE SENT TO YOUR REFERRING PHYSICIAN.  EXAM FINDINGS BY THE PHYSICIAN TODAY AND SIGNS OR SYMPTOMS TO REPORT TO CLINIC OR PRIMARY PHYSICIAN: Discussion by MD.  We will check your iron levels today and will let you know.  Don't have all of your test results back yet and will also let you know if there's any problems.  MEDICATIONS PRESCRIBED:  none  INSTRUCTIONS GIVEN AND DISCUSSED: Report increased fatigue, shortness of breath or increased ice intake.  SPECIAL INSTRUCTIONS/FOLLOW-UP: Blood work in 6 months and to see MD a week later.  Thank you for choosing [facility] to provide your oncology and hematology care.  To afford each patient quality time with our providers, please arrive at least 15 minutes before your scheduled appointment time.  With your help, our goal is to use those 15 minutes to complete the necessary work-up to ensure our physicians have the information they need to help with your evaluation and healthcare recommendations.    Effective January 1st, 2014, we ask that you re-schedule your appointment with our physicians should you arrive 10 or more minutes late for your appointment.  We strive to give you quality time with our providers, and arriving late affects you and other patients whose appointments are after yours.    Again, thank you for choosing [facility].  Our hope is that these requests will decrease the amount of time that you wait before being seen by our physicians.       _____________________________________________________________  Should you have questions after your visit to [facility], please contact our office at [phone] between the hours of 8:30 a.m. and 5:00 p.m.  Voicemails left after 4:30 p.m. will not be returned until the following business day.  For prescription refill requests, have  your pharmacy contact our office with your prescription refill request.

## 2013-03-25 NOTE — Progress Notes (Signed)
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 They are minimally higher compared to 3 years ago however her labs from 03/23/2013 are still pending. She still has no anemia, normal calcium, good but imperfect kidney function etc.  Problem #2 iron deficiency due to GU blood losses and possibly GI blood losses. She is on chronic Coumadin therapy for CHF with cardiomyopathy complicated by pulmonary hypertension. She will be on lifelong anticoagulation. She has been on Niferex generic. She did stop it for a couple months earlier this year. She is now back on it because of ice craving. I think it's time to check her formal iron studies which we will do today. We will then call her with results of both her SPEP and her iron studies.  Problem #3 massive obesity Problem #4 CHF scar myopathy Problem #5 diabetes mellitus Problem #6 hyperlipidemia Problem #7 Medtronic pacemaker insertion for sick sinus syndrome Problem #8 history of Hashimoto's thyroiditis  Therefore we are going to check her laboratory work and get back with her. We need to continue to monitor her monoclonal proteins. They are progressing in the past minimally at best. There is no indication presently to do a bone marrow biopsy.

## 2013-03-27 ENCOUNTER — Other Ambulatory Visit (HOSPITAL_COMMUNITY): Payer: Self-pay

## 2013-03-27 DIAGNOSIS — D509 Iron deficiency anemia, unspecified: Secondary | ICD-10-CM

## 2013-04-03 ENCOUNTER — Ambulatory Visit (INDEPENDENT_AMBULATORY_CARE_PROVIDER_SITE_OTHER): Payer: Medicare Other | Admitting: Internal Medicine

## 2013-04-03 ENCOUNTER — Encounter: Payer: Self-pay | Admitting: Internal Medicine

## 2013-04-03 VITALS — BP 110/80 | HR 86 | Ht 67.0 in

## 2013-04-03 DIAGNOSIS — G4733 Obstructive sleep apnea (adult) (pediatric): Secondary | ICD-10-CM

## 2013-04-03 DIAGNOSIS — Z23 Encounter for immunization: Secondary | ICD-10-CM

## 2013-04-03 DIAGNOSIS — R0602 Shortness of breath: Secondary | ICD-10-CM

## 2013-04-03 DIAGNOSIS — E662 Morbid (severe) obesity with alveolar hypoventilation: Secondary | ICD-10-CM

## 2013-04-03 MED ORDER — TIOTROPIUM BROMIDE MONOHYDRATE 18 MCG IN CAPS: 18.0000 ug | ORAL_CAPSULE | Freq: Every day | RESPIRATORY_TRACT | Status: DC

## 2013-04-03 MED ORDER — TIOTROPIUM BROMIDE MONOHYDRATE 18 MCG IN CAPS: ORAL_CAPSULE | RESPIRATORY_TRACT | Status: DC

## 2013-04-03 MED ORDER — ACLIDINIUM BROMIDE 400 MCG/ACT IN AEPB: 1.0000 | INHALATION_SPRAY | Freq: Two times a day (BID) | RESPIRATORY_TRACT | Status: DC

## 2013-04-03 NOTE — Patient Instructions (Addendum)
Sample and script sent for Spiriva   For comparison, try sample Tudorza    1 puff, twice daily       This is to compare with Spiriva- don't take both on same day.   Pneumovax

## 2013-04-03 NOTE — Progress Notes (Signed)
Subjective:    Patient ID: [name], female    DOB: 1950-07-11, 63 y.o.   MRN: [id number]  HPI 05/29/11- 25 yo morbidly obese, followed for OSA, pulmonary hypertension, complicated by DM, thyroid disease, AFib/pacemaker Was hosp with CHF/ AFIB in December- Dr Caprice Kluver. Improved temporarily, finding  Mitral Regurgitation. Had cath- clean coronaries  and TEE, then PFT and  CT at [facility]. Pulmonary Hypertension. Amiodarone was increased and she converted to NSR. Pacemaker has been replaced.  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 She questions "cardiac asthma". Dyspnea/ chest tightness responds to rescue inhaler, but little wheeze or cough. Today humid weather makes her more short of breath. Continues Spiriva When more restless at night it corresponds to note of more fluid retention next day. We discussed restless sleep as suggesting hypoxia. Uses CPAP all night every night at 9.   09/07/11-  60 yoF morbidly obese, followed for OSA, pulmonary hypertension, complicated by DM, thyroid disease, AFib/pacemaker, hx MGUS She describes problem with bruising 2 weeks ago. She saw Dr Clarene Duke and Dr Lenord Fellers-  Given Rocephin and doxycycline empirically. She is better now, but remains tireder than usual. She did not recognize an obvious infection otherwise. ONOX 08/31/11- </= 88% for 133 minutes. Reviewed with her and agreed she meets indications to add O2 for her CPAP, on the basis of obesity hypoventilation syndrome.  Breathing is comfortable now at rest and labored with exertion. She still tries to do water aerobics.  She wants to wait on flu vax till she finishes this round of doxycycline.   07/22/12-  60 yoF morbidly obese, followed for OSA, pulmonary hypertension, complicated by DM, thyroid disease, AFib/pacemaker, hx MGUS Wear  CPAP 9/ Advanced for 7 days week approx 8 hours night. on 2 L of O2 at night At recent cardiology followup with Dr. Leroy Libman 07/15/2012 moderate to severe mitral  regurgitation, RV mildly dilated with pacemaker lead,  LV severely dilated. EF 40-45%, PAP 55, severe TR, RVSP 50-60 Hospitalized in the spring with uterine bleed, abnormal GYN cytology being treated with Mirena IUD. Retaining fluid she blames on hormones. Continues doing water exercises at gym.  04/03/13- 62 yoF never smoker- morbidly obese, followed for OSA, pulmonary hypertension, complicated by DM, thyroid disease, AFib/pacemaker, hx MGUS CPAP 9/ Advanced w/ O2 2L for sleep. Follow up.  Wearing cpap with 2lpm o2 everynight for approx 8 hours.  No problems with mask or pressure. Doing better since hospital cardioversion for atrial fibrillation and increased dose of amiodarone. Much stress at home. CXR 11/04/12 IMPRESSION:  No acute intrathoracic abnormality.  Original Report Authenticated By: Alcide Clever, M.D.  Review of Systems- see HPI Constitutional:   No weight loss, night sweats,  Fevers, chills, fatigue, lassitude. HEENT:   No headaches,  Difficulty swallowing,  Tooth/dental problems,  Sore throat,                No sneezing, itching, ear ache, nasal congestion, post nasal drip,  CV:  No acute chest pain, orthopnea, PND,  anasarca, dizziness, palpitations. + peripheral edema GI  No heartburn, indigestion, abdominal pain, nausea, vomiting,  Resp: No excess mucus, no productive cough,  No non-productive cough,  No coughing up of blood.  No change in color of mucus.  No wheezing.   Skin: no rash or lesions. GU: . MS:  No joint pain or swelling.  Marland Kitchen Psych:  No change in mood or affect. No expressed depression or anxiety.  No memory loss.  Objective:   Physical  Exam General- Alert, Oriented, Affect-appropriate, Distress- none acute  Massively obese. O2 sat at rest RA 95% Skin- rash-none, lesions- none, excoriation- none Lymphadenopathy- none Head- atraumatic            Eyes- Gross vision intact, PERRLA, conjunctivae clear secretions            Ears- Hearing, canals normal             Nose- Clear, no-Septal dev, mucus, polyps, erosion, perforation             Throat- Mallampati II , mucosa clear , drainage- none, tonsils- atrophic Neck- flexible , trachea midline, no stridor , thyroid nl, carotid no bruit Chest - symmetrical excursion , unlabored           Heart/CV- RRR/ paced , no murmur , no gallop  , no rub, nl s1 s2                           - JVD+ , edema- 3-4+ chronic edema, +stasis changes, varices- none           Lung- clear to P&A, but shallow consistent with her body habitus, wheeze- none, cough- none , dullness-none, rub- none           Chest wall- +pacemaker right chest Abd-  Br/ Gen/ Rectal- Not done, not indicated Extrem- cyanosis- none, clubbing, none, atrophy- none, strength- nl Neuro- grossly intact to observation Assessment & Plan:

## 2013-04-06 ENCOUNTER — Encounter: Payer: Self-pay | Admitting: *Deleted

## 2013-04-07 ENCOUNTER — Encounter: Payer: Self-pay | Admitting: Cardiovascular Disease

## 2013-04-09 ENCOUNTER — Other Ambulatory Visit: Payer: Self-pay | Admitting: *Deleted

## 2013-04-09 DIAGNOSIS — E038 Other specified hypothyroidism: Secondary | ICD-10-CM

## 2013-04-09 MED ORDER — LEVOTHYROXINE SODIUM 125 MCG PO TABS: 125.0000 ug | ORAL_TABLET | Freq: Every day | ORAL | Status: DC

## 2013-04-12 NOTE — Assessment & Plan Note (Signed)
She remains compliant with CPAP, says it works well and she would not sleep without it.

## 2013-04-12 NOTE — Assessment & Plan Note (Signed)
She may eventually require bilevel or more sophisticated nasal ventilatory support for her breathing at night.

## 2013-04-15 ENCOUNTER — Encounter: Payer: Self-pay | Admitting: Oncology

## 2013-04-21 ENCOUNTER — Other Ambulatory Visit: Payer: Self-pay | Admitting: *Deleted

## 2013-04-21 DIAGNOSIS — E038 Other specified hypothyroidism: Secondary | ICD-10-CM

## 2013-05-13 ENCOUNTER — Ambulatory Visit: Payer: Medicare Other | Admitting: "Endocrinology

## 2013-05-14 ENCOUNTER — Other Ambulatory Visit: Payer: Self-pay | Admitting: Cardiovascular Disease

## 2013-05-14 NOTE — Telephone Encounter (Signed)
Refill sent electronically

## 2013-05-18 ENCOUNTER — Other Ambulatory Visit: Payer: Self-pay | Admitting: *Deleted

## 2013-05-18 MED ORDER — SITAGLIPTIN PHOSPHATE 100 MG PO TABS: 100.0000 mg | ORAL_TABLET | Freq: Every day | ORAL | Status: DC

## 2013-05-19 ENCOUNTER — Other Ambulatory Visit: Payer: Self-pay

## 2013-05-19 MED ORDER — AMIODARONE HCL 200 MG PO TABS: 200.0000 mg | ORAL_TABLET | Freq: Two times a day (BID) | ORAL | Status: DC

## 2013-05-19 NOTE — Telephone Encounter (Signed)
Rx was sent to pharmacy electronically. 

## 2013-05-21 ENCOUNTER — Other Ambulatory Visit (HOSPITAL_COMMUNITY): Payer: Medicare Other

## 2013-05-22 ENCOUNTER — Other Ambulatory Visit: Payer: Self-pay

## 2013-05-22 NOTE — Telephone Encounter (Signed)
Entry in error, called pharmacy to clarify medications and both Amiodarone and Potassium had already been filled.

## 2013-05-26 ENCOUNTER — Ambulatory Visit (INDEPENDENT_AMBULATORY_CARE_PROVIDER_SITE_OTHER): Payer: Medicare Other | Admitting: "Endocrinology

## 2013-05-26 ENCOUNTER — Encounter: Payer: Self-pay | Admitting: "Endocrinology

## 2013-05-26 VITALS — BP 115/77 | HR 74

## 2013-05-26 DIAGNOSIS — R609 Edema, unspecified: Secondary | ICD-10-CM

## 2013-05-26 DIAGNOSIS — R5381 Other malaise: Secondary | ICD-10-CM

## 2013-05-26 DIAGNOSIS — R600 Localized edema: Secondary | ICD-10-CM

## 2013-05-26 DIAGNOSIS — D509 Iron deficiency anemia, unspecified: Secondary | ICD-10-CM

## 2013-05-26 DIAGNOSIS — I10 Essential (primary) hypertension: Secondary | ICD-10-CM

## 2013-05-26 DIAGNOSIS — E038 Other specified hypothyroidism: Secondary | ICD-10-CM

## 2013-05-26 DIAGNOSIS — R5383 Other fatigue: Secondary | ICD-10-CM

## 2013-05-26 DIAGNOSIS — E063 Autoimmune thyroiditis: Secondary | ICD-10-CM

## 2013-05-26 DIAGNOSIS — E1149 Type 2 diabetes mellitus with other diabetic neurological complication: Secondary | ICD-10-CM

## 2013-05-26 DIAGNOSIS — E1142 Type 2 diabetes mellitus with diabetic polyneuropathy: Secondary | ICD-10-CM

## 2013-05-26 LAB — POCT GLYCOSYLATED HEMOGLOBIN (HGB A1C): Hemoglobin A1C: 6.9

## 2013-05-26 LAB — GLUCOSE, POCT (MANUAL RESULT ENTRY): POC Glucose: 169 mg/dl — AB (ref 70–99)

## 2013-05-26 NOTE — Patient Instructions (Signed)
Follow up visit in 3 months. Call if having more trouble with low BGs.

## 2013-05-26 NOTE — Progress Notes (Signed)
Subjective:  Patient Name: [name] Date of Birth: [birth year]  MRN: [id number]  Tyyne Cliett  presents to the office today for follow-up of her insulin-requiring T2DM, leg edema, left shin ulcer, hypertension, thyroiditis, hypothyroidism, and edema.  HISTORY OF PRESENT ILLNESS:   [name] is a 63 y.o. Caucasian woman. [name] was unaccompanied.  1.The patient was first referred to me on 10/30/05 by her primary care provider, Dr. Sharlet Salina, for evaluation and co-management of her diabetes and obesity. I have followed her for the above problems since then. Please see my clinic note from 06/06/11 for detailed description of the course of her type 2 diabetes mellitus, hypothyroidism secondary to Hashimoto's thyroiditis, and hyperparathyroidism secondary to deficiency of calcium intake, vitamin D intake, and renal conversion of 25-hydroxy vitamin D to 1, 25-dihydroxy vitamin D.   2. At her clinic visit on 03/24/12 she had a left shin ulcer that was open and draining. Her HbA1c was 8.0%. Her oral anti-hyperglycemic medications, Januvia, 100 mg/day, and glyburide, 10 mg twice a day, were not adequately controlling her BGs. I finally convinced her to add Lantus insulin, at a dose of 4 units each evening. I also coordinated with Dr. Lenord Fellers and started the patient on topical Bactroban and oral doxycycline. We have gradually but progressively increased her Lantus dose since then and her BGs have improved substantially.   3. The patient's last [facility] visit was on 02/05/13.   A. Her left periumiblical lesion is slowly healing.   B. She has not required any further admissions for atrial fibrillation and CHF.   C. The puncture wounds in her legs, that resulted from her ceiling falling in, are still slowly healing.   D. She is now taking 30 units of Lantus each evening (0.28 units/kg/day). She also takes glyburide, 10 mg at breakfast and 5 mg at supper and Januvia, 100 each AM.  She has not had  any more problems with hypoglycemia.    E. Her Medicare Advantage plan with [facility] has recently stopped covering glyburide. Unfortunately, she has failed treatment in the past with the two "preferred" alternatives, glipizide and glimepiride. That plan will also no longer cover Lantus insulin, but will cover Levemir. Unfortunately, since she is on a relatively low dose of Lantus, a comparable dose of Levemir would not be a 24-hour drug. At doses of 0.28 units/kg/day, Levemir is only about a 10-12 hour drug, which is why when the FDA originally approved Levemir, they required that it be used as a twice daily drug for T1DMs who usually take less than 0.8 units/kg/day. Levemir was approved for use in T2DM patients on either a once daily or twice daily dose depending upon the patients' weight and insulin resistance. Patients must be on doses of 0.8 units/kg/day or higher in order to obtain a 24-hour basal effectiveness profile.   F. She has been having more problems with balance. About one week ago she was walking, turned, and suddenly fell forward, breaking her fall with her right knee. That knee has been very painful since then. She can walk, but with a lot of pain. Today her sister is pushing [name] in a wheelchair because [name] is so afraid of falling again. Both shins hurt a lot. She also has a lot of pains in her thighs and hips.   4. Pertinent Review of Systems:  Constitutional: The patient generally feels very short of breath when walking. Her energy is not good recently. She sleeps well with  her CPAP and oxygen at 2 liters. Eyes: Vision is not as good as it was. She needs to have well-lit environments. She has another sty now. There are no other significant eye complaints. She has not yet been able to make another appointment with Dr. Luciana Axe. Neck: The patient has nor had had anymore anterior neck swelling, soreness, tenderness,  pressure, and discomfort in the past few months. She is more  hoarse recently, possibly due to her allergies acting up.   Heart: She still has A-fib frequently. Her pacemaker is still working. She still has chest pains when her HR is too high.  Lungs: She has not had any intermittent wheezing recently.  Gastrointestinal: She has not had any further nausea.  Bowel movents seem normal. She has not had much abdominal pain recently.   Legs: Muscle mass and strength seem normal. There are no complaints of numbness, tingling, burning, or pain. She has the healing puncture wounds as noted above. Edema is somewhat worse again in both legs.  Feet: There are no obvious foot problems. There are no complaints of numbness or pain. She has more foot swelling. She does have tingling and burning when the feet are more swollen. Feet are also colder then.  GYN: No menses Hypoglycemia: Lowest BG was 90. She was symptomatic then.  5. BG printout: Her average BG was 138, range 90-227. She's had only two BGs > 180. The vast majority of her BGs are between 100-150.   PAST MEDICAL, FAMILY, AND SOCIAL HISTORY:  Past Medical History  Diagnosis Date  . Personal history of other diseases of circulatory system   . 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 Chronic lymphocytic thyroiditis   . Morbid obesity   . Other chronic pulmonary heart diseases   . Obstructive sleep apnea (adult) (pediatric)   . Other and unspecified hyperlipidemia   . Fibromyalgia   . Goiter   . Thyroiditis, autoimmune   . Hypothyroidism, acquired, autoimmune   . Solitary kidney, acquired   . Fatigue   . Hyperparathyroidism, secondary   . Vitamin D deficiency disease   . Hyperparathyroidism   . H/O varicose veins 03/27/12  . Type II or unspecified type diabetes mellitus without mention of complication, not stated as uncontrolled   . Diabetes mellitus type II   . Infections of kidney 03/27/12  . History of measles, mumps, or rubella 03/27/12  . Tubal pregnancy 12/23/76  . Unspecified  essential hypertension     20 yrs ago  . Blood transfusion 1977    [facility]  . Complication of anesthesia 2010    hard to wake up after knee surgery  . Anemia   . Cardiomyopathy, nonischemic     EF 45 %  . Endometrial hyperplasia with atypia 05/05/12  . Diastolic HF (heart failure) 11/05/2012  . A-fib   . Echocardiogram abnormal 07/15/12    severe MR,mod to severe TR,mod to severe PA hypertension  . Pulmonary arterial hypertension     Family History  Problem Relation Age of Onset  . Cardiomyopathy Brother   . Diabetes Mother   . Cancer Maternal Grandmother   . Heart attack Mother     Current outpatient prescriptions:Aclidinium Bromide (TUDORZA PRESSAIR) 400 MCG/ACT AEPB, Inhale 1 puff into the lungs 2 (two) times daily., Disp: 1 each, Rfl: 0;  albuterol (PROVENTIL HFA;VENTOLIN HFA) 108 (90 BASE) MCG/ACT inhaler, Inhale 2 puffs into the lungs every 6 (six) hours as needed. For shortness of breath,  Disp: , Rfl:  albuterol (PROVENTIL) (2.5 MG/3ML) 0.083% nebulizer solution, Take 2.5 mg by nebulization every 6 (six) hours as needed. For shortness of breath, Disp: , Rfl: ;  ALPRAZolam (XANAX) 0.5 MG tablet, Take 0.25 mg by mouth 3 (three) times daily as needed. For anxiety, Disp: , Rfl: ;  amiodarone (CORDARONE) 200 MG tablet, Take 1 tablet (200 mg total) by mouth 2 (two) times daily., Disp: 60 tablet, Rfl: 6 calcitRIOL (ROCALTROL) 0.25 MCG capsule, Take 1 capsule (0.25 mcg total) by mouth 2 (two) times daily., Disp: 60 capsule, Rfl: 5;  calcium citrate-vitamin D 200-200 MG-UNIT TABS, Take 1 tablet by mouth 4 (four) times daily -  before meals and at bedtime., Disp: , Rfl: ;  digoxin (LANOXIN) 0.125 MG tablet, Take 0.125 mg by mouth daily., Disp: , Rfl:  DULoxetine (CYMBALTA) 30 MG capsule, Take 30 mg by mouth 2 (two) times daily as needed. For pain, Disp: , Rfl: ;  glucose blood (ONE TOUCH ULTRA TEST) test strip, Check sugar 6 x daily and per Protocols for Hypoglycemia & Hyperglycemia., Disp: 200  each, Rfl: 5;  glyBURIDE (DIABETA) 5 MG tablet, Take 2 tablets in AM with breakfast and 1 tablet in PM, Disp: 90 tablet, Rfl: 6 HYDROcodone-acetaminophen (NORCO) 10-325 MG per tablet, Take 1 tablet by mouth 2 (two) times daily., Disp: , Rfl: ;  iron polysaccharides (NIFEREX) 150 MG capsule, Take 1 capsule (150 mg total) by mouth 2 (two) times daily., Disp: 30 capsule, Rfl: 3;  levothyroxine (SYNTHROID, LEVOTHROID) 125 MCG tablet, Take 1 tablet (125 mcg total) by mouth daily., Disp: 30 tablet, Rfl: 4 metolazone (ZAROXOLYN) 5 MG tablet, Take 1 tablet (5 mg total) by mouth as needed (For 24 hour wt gain >=3 pounds). For edema, Disp: 30 tablet, Rfl: 5;  metoprolol tartrate (LOPRESSOR) 12.5 mg TABS, Take 1.5 tablets (37.5 mg total) by mouth 2 (two) times daily., Disp: 90 tablet, Rfl: 5;  Multiple Vitamin (MULTIVITAMIN) tablet, Take 1 tablet by mouth daily. , Disp: , Rfl:  mupirocin ointment (BACTROBAN) 2 %, Apply 1 application topically 2 (two) times daily. To leg blister, Disp: 60 g, Rfl: 6;  nitroGLYCERIN (NITRODUR - DOSED IN MG/24 HR) 0.4 mg/hr, Place 1 patch onto the skin every other day., Disp: , Rfl: ;  nitroGLYCERIN (NITROSTAT) 0.4 MG SL tablet, Place 0.4 mg under the tongue every 5 (five) minutes as needed. For chest pain, Disp: , Rfl:  potassium chloride SA (K-DUR,KLOR-CON) 20 MEQ tablet, TAKE 2 TABLETS DAILY., Disp: 60 tablet, Rfl: 11;  sitaGLIPtin (JANUVIA) 100 MG tablet, Take 1 tablet (100 mg total) by mouth daily., Disp: 30 tablet, Rfl: 5;  Torsemide (DEMADEX PO), Take 50 mg by mouth 2 (two) times daily. , Disp: , Rfl: ;  Vitamin D, Ergocalciferol, (DRISDOL) 50000 UNITS CAPS, TAKE 1 CAPSULE BY MOUTH TWICE A WEEK (ON TUESDAYS AND FRIDAYS), Disp: 9 capsule, Rfl: 4 warfarin (COUMADIN) 5 MG tablet, Take 5 mg by mouth daily. 5 mg on even days. 2.5 mg on odd days Pacemaker- complete heart block and AF, Disp: , Rfl: ;  zolpidem (AMBIEN) 10 MG tablet, Take 10 mg by mouth at bedtime as needed. sleep, Disp: ,  Rfl: ;  insulin glargine (LANTUS) 100 UNIT/ML injection, Inject 30 Units into the skin at bedtime. Increase dose by one unit every 4 days until most AM BGs are in the 80-120 range., Disp: , Rfl:   Allergies as of 05/26/2013 - Review Complete 05/26/2013  Allergen Reaction Noted  . Ivp dye (  iodinated diagnostic agents) Shortness Of Breath 05/19/2012  . Betadine (povidone iodine)  04/06/2013  . Diphenhydramine hcl Swelling   . Fish allergy Nausea And Vomiting 05/29/2011  . Iohexol  07/19/2005  . Penicillins Other (See Comments)   . Povidone Other (See Comments)   . Promethazine hcl Other (See Comments)   . Tape  08/06/2011  . Clindamycin Hives and Rash 05/17/2008  . Iodine Rash 05/21/2012  . Morphine sulfate Nausea And Vomiting and Rash     1. Work and Family: She spends a lot of her time back and forth to her many physicians. 2. Activities: Same 3. Smoking, alcohol, or drugs: none 4. Primary Care Provider: Margaree Mackintosh, MD 5. Cardiologist: Dr. Rachelle Hora Croitoru 6. GYN: Dr. Osborn Coho 7. Oncologist: Dr. Glenford Peers at the [facility]  REVIEW OF SYSTEMS: There are no other significant problems involving [name]'s other body systems.   Objective:  Vital Signs:  BP 115/77  Pulse 74   Ht Readings from Last 3 Encounters:  04/03/13 5\' 7"  (1.702 m)  01/30/13 5\' 7"  (1.702 m)  11/03/12 5\' 7"  (1.702 m)   Wt Readings from Last 3 Encounters:  03/25/13 322 lb (146.058 kg)  02/05/13 339 lb (153.769 kg)  01/30/13 338 lb (153.316 kg)    There is no height or weight on file to calculate BSA.  PHYSICAL EXAM:  Constitutional: The patient appears healthy and well nourished. She is very alert and bright today, but does look somewhat ill and tired. She has lost 17 pounds since last visit.  Face: The face appears normal.  Eyes: There is no obvious arcus or proptosis. Moisture appears normal. Mouth: The oropharynx appears normal. Her mouth and tongue are dry. Neck: The  neck appears to be visibly normal. No carotid bruits are noted. The thyroid gland is 18-20 grams in size. The consistency of the thyroid gland is normal. The thyroid gland is tender to palpation today bilaterally, more on the right than on the left.  Lungs: The lungs are clear to auscultation. Air movement is good. Heart: Heart rate and rhythm are regular. Heart sounds S1 and S2 are normal. She has a soft S4 heart sound today.  Abdomen: The abdomen is quite enlarged. Bowel sounds are normal. There is no obvious hepatomegaly, splenomegaly, or other mass effect. She has a healing superficial ulcer of the left mid-abdomen. Arms: Muscle size and bulk are normal for age. Hands: There is no obvious tremor. Phalangeal and metacarpophalangeal joints are normal. Palmar muscles are normal. Palmar skin is normal. Palmar moisture is also normal. Legs: Muscles appear normal for age. Leg edema is significantly increased again to 3+.  Feet: Feet are normally formed. She has 3+ pitting edema of the dorsa and toes.  Neurologic: Strength is normal for age in the arms, but decreased in the hip flexors. Sensation to touch is normal in both legs, but decreased in both heels.  LAB DATA:  Results for orders placed in visit on 05/26/13 (from the past 504 hour(s))  GLUCOSE, POCT (MANUAL RESULT ENTRY)   Collection Time    05/26/13 12:16 PM      Result Value Range   POC Glucose 169 (*) 70 - 99 mg/dl  POCT GLYCOSYLATED HEMOGLOBIN (HGB A1C)   Collection Time    05/26/13 12:39 PM      Result Value Range   Hemoglobin A1C 6.9    Her HbA1c is 6.9% today, compared with 8.0% at last visit and with  7.1% at  the visit prior. . The lower A1c reflects the lower BGs that she has had during the past 2-3 months when she been relatively healthy and well.  Labs 05/05/13: TSH 1.791, free T4 1.86, free T3 2.3 on her current Synthroid dose of 125 mcg/day.  Labs: 10/27/12: TSH 3.745, free T4 2.12, free T3 2.6; CMP: Cr 1.4; urinary  microalbumin/creatinine concentration 89.6   Assessment and Plan:   ASSESSMENT:  1. Insulin-requiring T2DM:  The patient's BGs are much better since she recovered from all of her acute, serious illnesses. Her current doses of medications are doing a good job of controlling her BGs without causing hypoglycemia.   2. Hypoglycemia/neuroglycopenia: The patient has not had any hypoglycemia recently.    3. A-fib: The arrhythmia comes and goes of its own volition. She appears to be in sinus rhythm today.   4. Leg edema: Edema is still severe.  5. Hypertension: BPs are well within the normal range today.  6. Abdominal pains: Her abdominal pains have essentially resolved.   7. Iron deficiency: Dr. Mariel Sleet wants to put her on iv iron if her iron studies today are not better. .  8. Hypothyroidism/Thyroiditis: Her thyroiditis is again clinically active today. Her TFTs this month were very normal. Her TFTs in October were "screwy" in that they did not make sense innately. When such tests occur, they are usually associated with either a flare up of Hashimoto's disease, which she was having at the time, or due to amiodarone. We need to repeat her TFTs every 6 months.. 9. Fatigue: The most likely cause of her fatigue is anemia due to iron deficiency. 10. Diabetic peripheral neuropathy: Her sensation is a bit worse today, reflecting her higher BGs 4-6 months ago.  PLAN:  1. Diagnostic: Serum iron, ferritin, and TIBC. Repeat TFTs in 6 months. Check BGs three times a day, before breakfast, before supper, and at bedtime. 2. Therapeutic:   A. Watch salt intake tightly.   B. Continue Lantus, glyburide, and Januvia at their current doses.   Follow the Very Small scale HS snack plan.  Will consider reducing the Lantus doses if she loses more weight.  3. Patient education: Until she can lose significant amounts of weight, she will need to continue the insulin and oral agent therapies.  4. Follow-up: 3  months  Level of Service: This visit lasted in excess of 60 minutes. More than 50% of the visit was devoted to counseling.  David Stall, MD 05/26/2013 12:42 PM

## 2013-05-27 ENCOUNTER — Telehealth (HOSPITAL_COMMUNITY): Payer: Self-pay

## 2013-05-27 ENCOUNTER — Other Ambulatory Visit (HOSPITAL_COMMUNITY): Payer: Self-pay

## 2013-05-27 DIAGNOSIS — D649 Anemia, unspecified: Secondary | ICD-10-CM

## 2013-05-27 LAB — IRON AND TIBC
Iron: 36 ug/dL — ABNORMAL LOW (ref 42–145)
TIBC: 500 ug/dL — ABNORMAL HIGH (ref 250–470)
UIBC: 464 ug/dL — ABNORMAL HIGH (ref 125–400)

## 2013-05-27 NOTE — Telephone Encounter (Signed)
Message copied by Evelena Leyden on Wed May 27, 2013  5:35 PM ------      Message from: Mariel Sleet, ERIC S      Created: Wed May 27, 2013  8:47 AM       Severe iron deficiency still-iron studies done 5/27      Will she consider iv IRON??? ------

## 2013-05-27 NOTE — Telephone Encounter (Signed)
Message copied by Evelena Leyden on Wed May 27, 2013  5:38 PM ------      Message from: Mariel Sleet, ERIC S      Created: Wed May 27, 2013  8:47 AM       Severe iron deficiency still-iron studies done 5/27      Will she consider iv IRON??? ------

## 2013-05-27 NOTE — Telephone Encounter (Signed)
Patient is willing to take IV iron and is scheduled for tomorrow at 11:45.  Is very dyspneic.

## 2013-05-28 ENCOUNTER — Encounter (HOSPITAL_COMMUNITY): Payer: Medicare Other | Attending: Oncology

## 2013-05-28 VITALS — BP 121/62 | HR 71 | Resp 18

## 2013-05-28 DIAGNOSIS — D509 Iron deficiency anemia, unspecified: Secondary | ICD-10-CM | POA: Insufficient documentation

## 2013-05-28 MED ORDER — SODIUM CHLORIDE 0.9 % IV SOLN
1020.0000 mg | Freq: Once | INTRAVENOUS | Status: AC
  Administered 2013-05-28: 1020 mg via INTRAVENOUS
  Filled 2013-05-28: qty 34

## 2013-05-28 MED ORDER — SODIUM CHLORIDE 0.9 % IV SOLN
INTRAVENOUS | Status: DC
  Administered 2013-05-28: 12:00:00 via INTRAVENOUS

## 2013-05-28 MED ORDER — SODIUM CHLORIDE 0.9 % IJ SOLN
10.0000 mL | INTRAMUSCULAR | Status: DC | PRN
  Administered 2013-05-28: 10 mL via INTRAVENOUS
  Filled 2013-05-28: qty 10

## 2013-06-02 ENCOUNTER — Ambulatory Visit (INDEPENDENT_AMBULATORY_CARE_PROVIDER_SITE_OTHER): Payer: Medicare Other | Admitting: Pharmacist Clinician (PhC)/ Clinical Pharmacy Specialist

## 2013-06-02 ENCOUNTER — Inpatient Hospital Stay (HOSPITAL_COMMUNITY)
Admission: AD | Admit: 2013-06-02 | Discharge: 2013-06-11 | DRG: 308 | Disposition: A | Discharge status: Home / Self Care | Payer: Medicare Other | Source: Ambulatory Visit | Attending: Cardiovascular Disease | Admitting: Cardiovascular Disease

## 2013-06-02 ENCOUNTER — Other Ambulatory Visit: Payer: Self-pay | Admitting: Cardiovascular Disease

## 2013-06-02 ENCOUNTER — Ambulatory Visit (INDEPENDENT_AMBULATORY_CARE_PROVIDER_SITE_OTHER): Payer: Medicare Other | Admitting: Cardiovascular Disease

## 2013-06-02 ENCOUNTER — Encounter (HOSPITAL_COMMUNITY): Payer: Self-pay | Admitting: *Deleted

## 2013-06-02 ENCOUNTER — Inpatient Hospital Stay (HOSPITAL_COMMUNITY): Payer: Medicare Other

## 2013-06-02 VITALS — BP 100/60 | HR 76 | Ht 67.0 in | Wt 358.4 lb

## 2013-06-02 DIAGNOSIS — Z794 Long term (current) use of insulin: Secondary | ICD-10-CM

## 2013-06-02 DIAGNOSIS — I340 Nonrheumatic mitral (valve) insufficiency: Secondary | ICD-10-CM

## 2013-06-02 DIAGNOSIS — I428 Other cardiomyopathies: Secondary | ICD-10-CM

## 2013-06-02 DIAGNOSIS — I509 Heart failure, unspecified: Secondary | ICD-10-CM | POA: Diagnosis present

## 2013-06-02 DIAGNOSIS — L97911 Non-pressure chronic ulcer of unspecified part of right lower leg limited to breakdown of skin: Secondary | ICD-10-CM

## 2013-06-02 DIAGNOSIS — I5043 Acute on chronic combined systolic (congestive) and diastolic (congestive) heart failure: Secondary | ICD-10-CM

## 2013-06-02 DIAGNOSIS — R0602 Shortness of breath: Secondary | ICD-10-CM

## 2013-06-02 DIAGNOSIS — E063 Autoimmune thyroiditis: Secondary | ICD-10-CM

## 2013-06-02 DIAGNOSIS — I517 Cardiomegaly: Secondary | ICD-10-CM | POA: Diagnosis present

## 2013-06-02 DIAGNOSIS — I10 Essential (primary) hypertension: Secondary | ICD-10-CM | POA: Diagnosis present

## 2013-06-02 DIAGNOSIS — L089 Local infection of the skin and subcutaneous tissue, unspecified: Secondary | ICD-10-CM

## 2013-06-02 DIAGNOSIS — Z950 Presence of cardiac pacemaker: Secondary | ICD-10-CM

## 2013-06-02 DIAGNOSIS — G4733 Obstructive sleep apnea (adult) (pediatric): Secondary | ICD-10-CM | POA: Diagnosis present

## 2013-06-02 DIAGNOSIS — Z7901 Long term (current) use of anticoagulants: Secondary | ICD-10-CM

## 2013-06-02 DIAGNOSIS — Z905 Acquired absence of kidney: Secondary | ICD-10-CM

## 2013-06-02 DIAGNOSIS — I4891 Unspecified atrial fibrillation: Secondary | ICD-10-CM

## 2013-06-02 DIAGNOSIS — E662 Morbid (severe) obesity with alveolar hypoventilation: Secondary | ICD-10-CM | POA: Diagnosis present

## 2013-06-02 DIAGNOSIS — L97929 Non-pressure chronic ulcer of unspecified part of left lower leg with unspecified severity: Secondary | ICD-10-CM | POA: Diagnosis present

## 2013-06-02 DIAGNOSIS — L97909 Non-pressure chronic ulcer of unspecified part of unspecified lower leg with unspecified severity: Secondary | ICD-10-CM | POA: Diagnosis present

## 2013-06-02 DIAGNOSIS — L02419 Cutaneous abscess of limb, unspecified: Secondary | ICD-10-CM | POA: Diagnosis not present

## 2013-06-02 DIAGNOSIS — E876 Hypokalemia: Secondary | ICD-10-CM

## 2013-06-02 DIAGNOSIS — I5041 Acute combined systolic (congestive) and diastolic (congestive) heart failure: Secondary | ICD-10-CM

## 2013-06-02 DIAGNOSIS — E119 Type 2 diabetes mellitus without complications: Secondary | ICD-10-CM | POA: Diagnosis present

## 2013-06-02 DIAGNOSIS — I4819 Other persistent atrial fibrillation: Secondary | ICD-10-CM | POA: Diagnosis present

## 2013-06-02 DIAGNOSIS — D472 Monoclonal gammopathy: Secondary | ICD-10-CM | POA: Diagnosis present

## 2013-06-02 DIAGNOSIS — E213 Hyperparathyroidism, unspecified: Secondary | ICD-10-CM | POA: Diagnosis present

## 2013-06-02 DIAGNOSIS — I129 Hypertensive chronic kidney disease with stage 1 through stage 4 chronic kidney disease, or unspecified chronic kidney disease: Secondary | ICD-10-CM | POA: Diagnosis present

## 2013-06-02 DIAGNOSIS — Z6841 Body Mass Index (BMI) 40.0 and over, adult: Secondary | ICD-10-CM

## 2013-06-02 DIAGNOSIS — N184 Chronic kidney disease, stage 4 (severe): Secondary | ICD-10-CM

## 2013-06-02 DIAGNOSIS — I5042 Chronic combined systolic (congestive) and diastolic (congestive) heart failure: Secondary | ICD-10-CM | POA: Diagnosis present

## 2013-06-02 DIAGNOSIS — I5033 Acute on chronic diastolic (congestive) heart failure: Secondary | ICD-10-CM

## 2013-06-02 DIAGNOSIS — I480 Paroxysmal atrial fibrillation: Secondary | ICD-10-CM

## 2013-06-02 DIAGNOSIS — I2789 Other specified pulmonary heart diseases: Secondary | ICD-10-CM

## 2013-06-02 DIAGNOSIS — L03119 Cellulitis of unspecified part of limb: Secondary | ICD-10-CM | POA: Diagnosis not present

## 2013-06-02 HISTORY — DX: Non-pressure chronic ulcer of unspecified part of right lower leg with unspecified severity: L97.919

## 2013-06-02 HISTORY — DX: Paroxysmal atrial fibrillation: I48.0

## 2013-06-02 HISTORY — DX: Non-pressure chronic ulcer of unspecified part of left lower leg with unspecified severity: L97.929

## 2013-06-02 LAB — GLUCOSE, CAPILLARY: Glucose-Capillary: 101 mg/dL — ABNORMAL HIGH (ref 70–99)

## 2013-06-02 LAB — CBC
MCHC: 32 g/dL (ref 30.0–36.0)
MCV: 90.4 fL (ref 78.0–100.0)
Platelets: 157 10*3/uL (ref 150–400)
RDW: 18.6 % — ABNORMAL HIGH (ref 11.5–15.5)
WBC: 6.5 10*3/uL (ref 4.0–10.5)

## 2013-06-02 LAB — BASIC METABOLIC PANEL
Calcium: 9.8 mg/dL (ref 8.4–10.5)
Chloride: 98 mEq/L (ref 96–112)
Creatinine, Ser: 2.12 mg/dL — ABNORMAL HIGH (ref 0.50–1.10)
GFR calc Af Amer: 27 mL/min — ABNORMAL LOW (ref >90)
GFR calc non Af Amer: 23 mL/min — ABNORMAL LOW (ref >90)

## 2013-06-02 LAB — PACEMAKER DEVICE OBSERVATION

## 2013-06-02 LAB — PRO B NATRIURETIC PEPTIDE: Pro B Natriuretic peptide (BNP): 5132 pg/mL — ABNORMAL HIGH (ref 0–125)

## 2013-06-02 MED ORDER — CALCITRIOL 0.25 MCG PO CAPS
0.2500 ug | ORAL_CAPSULE | Freq: Two times a day (BID) | ORAL | Status: DC
  Administered 2013-06-02 – 2013-06-11 (×18): 0.25 ug via ORAL
  Filled 2013-06-02 (×20): qty 1

## 2013-06-02 MED ORDER — INSULIN ASPART 100 UNIT/ML ~~LOC~~ SOLN
0.0000 [IU] | Freq: Three times a day (TID) | SUBCUTANEOUS | Status: DC
  Administered 2013-06-03: 4 [IU] via SUBCUTANEOUS
  Administered 2013-06-04: 3 [IU] via SUBCUTANEOUS
  Administered 2013-06-04 – 2013-06-05 (×3): 4 [IU] via SUBCUTANEOUS
  Administered 2013-06-05 – 2013-06-06 (×3): 3 [IU] via SUBCUTANEOUS
  Administered 2013-06-07 – 2013-06-10 (×3): 4 [IU] via SUBCUTANEOUS
  Administered 2013-06-10: 3 [IU] via SUBCUTANEOUS

## 2013-06-02 MED ORDER — LEVOTHYROXINE SODIUM 125 MCG PO TABS
125.0000 ug | ORAL_TABLET | Freq: Every day | ORAL | Status: DC
  Administered 2013-06-03 – 2013-06-11 (×9): 125 ug via ORAL
  Filled 2013-06-02 (×10): qty 1

## 2013-06-02 MED ORDER — HYDROCODONE-ACETAMINOPHEN 10-325 MG PO TABS
1.0000 | ORAL_TABLET | Freq: Two times a day (BID) | ORAL | Status: DC
  Administered 2013-06-02 – 2013-06-11 (×18): 1 via ORAL
  Filled 2013-06-02 (×18): qty 1

## 2013-06-02 MED ORDER — WARFARIN SODIUM 5 MG PO TABS: 5.0000 mg | ORAL_TABLET | Freq: Every day | ORAL | Status: DC

## 2013-06-02 MED ORDER — WARFARIN SODIUM 5 MG PO TABS
5.0000 mg | ORAL_TABLET | ORAL | Status: AC
  Administered 2013-06-02: 5 mg via ORAL
  Filled 2013-06-02: qty 1

## 2013-06-02 MED ORDER — WARFARIN SODIUM 2.5 MG PO TABS
2.5000 mg | ORAL_TABLET | ORAL | Status: DC
  Administered 2013-06-03 – 2013-06-09 (×4): 2.5 mg via ORAL
  Filled 2013-06-02 (×4): qty 1

## 2013-06-02 MED ORDER — POTASSIUM CHLORIDE CRYS ER 20 MEQ PO TBCR
20.0000 meq | EXTENDED_RELEASE_TABLET | Freq: Two times a day (BID) | ORAL | Status: DC
  Administered 2013-06-02 – 2013-06-11 (×18): 20 meq via ORAL
  Filled 2013-06-02 (×22): qty 1

## 2013-06-02 MED ORDER — FUROSEMIDE 10 MG/ML IJ SOLN
80.0000 mg | Freq: Two times a day (BID) | INTRAMUSCULAR | Status: DC
  Administered 2013-06-02 – 2013-06-09 (×15): 80 mg via INTRAVENOUS
  Filled 2013-06-02 (×19): qty 8

## 2013-06-02 MED ORDER — SODIUM CHLORIDE 0.9 % IV SOLN: 250.0000 mL | INTRAVENOUS | Status: DC | PRN

## 2013-06-02 MED ORDER — HEPARIN SODIUM (PORCINE) 5000 UNIT/ML IJ SOLN: 5000.0000 [IU] | Freq: Three times a day (TID) | INTRAMUSCULAR | Status: DC

## 2013-06-02 MED ORDER — AMIODARONE HCL 200 MG PO TABS
200.0000 mg | ORAL_TABLET | Freq: Two times a day (BID) | ORAL | Status: DC
  Administered 2013-06-02 – 2013-06-08 (×12): 200 mg via ORAL
  Filled 2013-06-02 (×14): qty 1

## 2013-06-02 MED ORDER — ZOLPIDEM TARTRATE 5 MG PO TABS
10.0000 mg | ORAL_TABLET | Freq: Every evening | ORAL | Status: DC | PRN
  Administered 2013-06-02: 10 mg via ORAL
  Filled 2013-06-02: qty 2

## 2013-06-02 MED ORDER — ALPRAZOLAM 0.25 MG PO TABS
0.2500 mg | ORAL_TABLET | Freq: Three times a day (TID) | ORAL | Status: DC | PRN
  Administered 2013-06-07 – 2013-06-10 (×4): 0.25 mg via ORAL
  Filled 2013-06-02 (×4): qty 1

## 2013-06-02 MED ORDER — ONDANSETRON HCL 4 MG/2ML IJ SOLN: 4.0000 mg | Freq: Four times a day (QID) | INTRAMUSCULAR | Status: DC | PRN

## 2013-06-02 MED ORDER — ACLIDINIUM BROMIDE 400 MCG/ACT IN AEPB: 1.0000 | INHALATION_SPRAY | Freq: Two times a day (BID) | RESPIRATORY_TRACT | Status: DC

## 2013-06-02 MED ORDER — ALBUTEROL SULFATE (5 MG/ML) 0.5% IN NEBU: 2.5000 mg | INHALATION_SOLUTION | Freq: Four times a day (QID) | RESPIRATORY_TRACT | Status: DC | PRN

## 2013-06-02 MED ORDER — SODIUM CHLORIDE 0.9 % IJ SOLN
3.0000 mL | INTRAMUSCULAR | Status: DC | PRN
  Administered 2013-06-07: 3 mL via INTRAVENOUS

## 2013-06-02 MED ORDER — POLYSACCHARIDE IRON COMPLEX 150 MG PO CAPS
150.0000 mg | ORAL_CAPSULE | Freq: Two times a day (BID) | ORAL | Status: DC
  Administered 2013-06-02 – 2013-06-11 (×18): 150 mg via ORAL
  Filled 2013-06-02 (×19): qty 1

## 2013-06-02 MED ORDER — MUPIROCIN 2 % EX OINT
1.0000 "application " | TOPICAL_OINTMENT | Freq: Two times a day (BID) | CUTANEOUS | Status: DC
  Filled 2013-06-02: qty 22

## 2013-06-02 MED ORDER — WARFARIN - PHARMACIST DOSING INPATIENT
Freq: Every day | Status: DC
  Administered 2013-06-06 – 2013-06-07 (×2)

## 2013-06-02 MED ORDER — NITROGLYCERIN 0.4 MG SL SUBL: 0.4000 mg | SUBLINGUAL_TABLET | SUBLINGUAL | Status: DC | PRN

## 2013-06-02 MED ORDER — VITAMIN D (ERGOCALCIFEROL) 1.25 MG (50000 UNIT) PO CAPS
50000.0000 [IU] | ORAL_CAPSULE | ORAL | Status: DC
  Administered 2013-06-08 – 2013-06-11 (×2): 50000 [IU] via ORAL
  Filled 2013-06-02 (×5): qty 1

## 2013-06-02 MED ORDER — NITROGLYCERIN 0.4 MG/HR TD PT24
0.4000 mg | MEDICATED_PATCH | TRANSDERMAL | Status: DC
  Filled 2013-06-02 (×5): qty 1

## 2013-06-02 MED ORDER — METOPROLOL TARTRATE 25 MG PO TABS
37.5000 mg | ORAL_TABLET | Freq: Two times a day (BID) | ORAL | Status: DC
  Administered 2013-06-02 – 2013-06-11 (×18): 37.5 mg via ORAL
  Filled 2013-06-02 (×19): qty 1

## 2013-06-02 MED ORDER — DULOXETINE HCL 30 MG PO CPEP
30.0000 mg | ORAL_CAPSULE | Freq: Two times a day (BID) | ORAL | Status: DC | PRN
  Filled 2013-06-02: qty 1

## 2013-06-02 MED ORDER — DIGOXIN 125 MCG PO TABS
0.1250 mg | ORAL_TABLET | Freq: Every day | ORAL | Status: DC
  Administered 2013-06-03 – 2013-06-11 (×9): 0.125 mg via ORAL
  Filled 2013-06-02 (×9): qty 1

## 2013-06-02 MED ORDER — GLYBURIDE 5 MG PO TABS
5.0000 mg | ORAL_TABLET | Freq: Two times a day (BID) | ORAL | Status: DC
  Administered 2013-06-03: 5 mg via ORAL
  Filled 2013-06-02 (×3): qty 1

## 2013-06-02 MED ORDER — SODIUM CHLORIDE 0.9 % IJ SOLN
3.0000 mL | Freq: Two times a day (BID) | INTRAMUSCULAR | Status: DC
  Administered 2013-06-03 – 2013-06-11 (×8): 3 mL via INTRAVENOUS

## 2013-06-02 MED ORDER — LINAGLIPTIN 5 MG PO TABS
5.0000 mg | ORAL_TABLET | Freq: Every day | ORAL | Status: DC
  Administered 2013-06-03 – 2013-06-11 (×9): 5 mg via ORAL
  Filled 2013-06-02 (×9): qty 1

## 2013-06-02 MED ORDER — INSULIN GLARGINE 100 UNIT/ML ~~LOC~~ SOLN
30.0000 [IU] | Freq: Every day | SUBCUTANEOUS | Status: DC
  Administered 2013-06-02 – 2013-06-10 (×8): 30 [IU] via SUBCUTANEOUS
  Filled 2013-06-02 (×10): qty 0.3

## 2013-06-02 MED ORDER — ACETAMINOPHEN 325 MG PO TABS
650.0000 mg | ORAL_TABLET | ORAL | Status: DC | PRN
  Administered 2013-06-04 – 2013-06-10 (×8): 650 mg via ORAL
  Filled 2013-06-02 (×8): qty 2

## 2013-06-02 NOTE — Progress Notes (Signed)
Patient ID: [name], female   DOB: 03-Oct-1950, 63 y.o.   MRN: [id number]  Patient presented today for complaints of worsening shortness of breath.  She is severely dyspneic at rest with a respiratory rate of approximately 24 respirations per minute and an oxygen saturation of 87% on room marrow. She has been feeling ill now for at least a week. She has gained roughly 23 pounds in weight with considerable worsening of her already severe lower extremity edema. She has tried taking Zaroxolyn. This initially led to substantial diuresis but she has reaccumulated fluid and is now orthopneic and very edematous.  Interrogation of her dual-chamber pacemaker (Medtronic) shows recurrent persistent atrial fibrillation. This began roughly 2 weeks before today's presentation,  And is the first episode of persistent atrial fibrillation since her cardioversion in January. Secondary to under sensing of the very fine fibrillation waves the device reports that the atrial fibrillation has been occurring off and on over the last 2 weeks, but I suspect it has actually been one continuous episode. Ventricular rates have not been fast.   This has occurred even though she is on amiodarone 200 mg twice daily. She has been therapeutically anticoagulated with INR greater than 2 ON the last 3 INR checks.  Significant comorbidities of morbid obesity, obstructive sleep apnea and pulmonary arterial hypertension, multifactorial anemia including iron deficiency currently being treated with intravenous iron, chronic kidney disease stage 3-4, diabetes mellitus type 2 requiring insulin, hyperparathyroidism and monoclonal gammopathy of uncertain significance.  She'll be admitted for intravenous diuretics, oxygen, planned cardioversion once her respiratory status has improved (probably no sooner than Thursday). Since she has been fully anticoagulated a transesophageal echocardiogram will be not be necessary. Note that she is organized  those of amiodarone. Consider electrophysiology evaluation.

## 2013-06-02 NOTE — Progress Notes (Signed)
Pt is direct admit from home .  A&0 x4.  Call bell at the bedside.  Introduced to new surrounds.  Pt verbalized understanding.  Husband at the bedside.  Amanda Pea, RN

## 2013-06-02 NOTE — Progress Notes (Signed)
In office pacemaker interrogation for evaluation of episodes only. Normal device function. Minor changes made to programming.

## 2013-06-02 NOTE — Progress Notes (Signed)
ANTICOAGULATION CONSULT NOTE - Initial Consult  Pharmacy Consult for Warfarin Indication: atrial fibrillation  Allergies  Allergen Reactions  . Ivp Dye (Iodinated Diagnostic Agents) Shortness Of Breath    Turn red, can't breathe  . Betadine (Povidone Iodine)   . Diphenhydramine Hcl Swelling    In hands and eyes  . Fish Allergy Nausea And Vomiting  . Iohexol      Desc: PT TURNS RED AND WHEEZING   . Penicillins Other (See Comments)    Resp arrest as child  . Povidone Other (See Comments)    Wheezing, turn red, can't breath, and blisters  . Promethazine Hcl Other (See Comments)    Low Blood Pressure  . Tape     Blisters, can use paper tape for short periods  . Clindamycin Hives and Rash    wheezing  . Iodine Rash  . Morphine Sulfate Nausea And Vomiting and Rash   Patient Measurements: Height: 5\' 7"  (170.2 cm) Weight: 354 lb 8 oz (160.8 kg) IBW/kg (Calculated) : 61.6  Vital Signs: Temp: 97.9 F (36.6 C) (06/03 1828) Temp src: Oral (06/03 1828) BP: 117/81 mmHg (06/03 1828) Pulse Rate: 72 (06/03 1828)  Labs:  Recent Labs  06/02/13 1534  INR 3.1   Estimated Creatinine Clearance: 47.8 ml/min (by C-G formula based on Cr of 1.9).  Medical History: Past Medical History  Diagnosis Date  . Personal history of other diseases of circulatory system   . 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 Chronic lymphocytic thyroiditis   . Morbid obesity   . Other chronic pulmonary heart diseases   . Obstructive sleep apnea (adult) (pediatric)   . Other and unspecified hyperlipidemia   . Fibromyalgia   . Goiter   . Thyroiditis, autoimmune   . Hypothyroidism, acquired, autoimmune   . Solitary kidney, acquired   . Fatigue   . Hyperparathyroidism, secondary   . Vitamin D deficiency disease   . Hyperparathyroidism   . H/O varicose veins 03/27/12  . Type II or unspecified type diabetes mellitus without mention of complication, not stated as uncontrolled   .  Diabetes mellitus type II   . Infections of kidney 03/27/12  . History of measles, mumps, or rubella 03/27/12  . Tubal pregnancy 12/23/76  . Unspecified essential hypertension     20 yrs ago  . Blood transfusion 1977    [facility]  . Complication of anesthesia 2010    hard to wake up after knee surgery  . Anemia   . Cardiomyopathy, nonischemic     EF 45 %  . Endometrial hyperplasia with atypia 05/05/12  . Diastolic HF (heart failure) 11/05/2012  . A-fib   . Echocardiogram abnormal 07/15/12    severe MR,mod to severe TR,mod to severe PA hypertension  . Pulmonary arterial hypertension    Medications:  Prescriptions prior to admission  Medication Sig Dispense Refill  . Aclidinium Bromide (TUDORZA PRESSAIR) 400 MCG/ACT AEPB Inhale 1 puff into the lungs 2 (two) times daily.  1 each  0  . albuterol (PROVENTIL HFA;VENTOLIN HFA) 108 (90 BASE) MCG/ACT inhaler Inhale 2 puffs into the lungs every 6 (six) hours as needed. For shortness of breath      . albuterol (PROVENTIL) (2.5 MG/3ML) 0.083% nebulizer solution Take 2.5 mg by nebulization every 6 (six) hours as needed. For shortness of breath      . ALPRAZolam (XANAX) 0.5 MG tablet Take 0.25 mg by mouth 3 (three) times daily as needed. For anxiety      .  amiodarone (CORDARONE) 200 MG tablet Take 1 tablet (200 mg total) by mouth 2 (two) times daily.  60 tablet  6  . calcitRIOL (ROCALTROL) 0.25 MCG capsule Take 1 capsule (0.25 mcg total) by mouth 2 (two) times daily.  60 capsule  5  . calcium citrate-vitamin D 200-200 MG-UNIT TABS Take 1 tablet by mouth 4 (four) times daily -  before meals and at bedtime.      . digoxin (LANOXIN) 0.125 MG tablet Take 0.125 mg by mouth daily.      . DULoxetine (CYMBALTA) 30 MG capsule Take 30 mg by mouth 2 (two) times daily as needed. For pain      . glucose blood (ONE TOUCH ULTRA TEST) test strip Check sugar 6 x daily and per Protocols for Hypoglycemia & Hyperglycemia.  200 each  5  . glyBURIDE (DIABETA) 5 MG tablet Take 2  tablets in AM with breakfast and 1 tablet in PM  90 tablet  6  . HYDROcodone-acetaminophen (NORCO) 10-325 MG per tablet Take 1 tablet by mouth 2 (two) times daily.      . insulin glargine (LANTUS) 100 UNIT/ML injection Inject 30 Units into the skin at bedtime. Increase dose by one unit every 4 days until most AM BGs are in the 80-120 range.      . iron polysaccharides (NIFEREX) 150 MG capsule Take 1 capsule (150 mg total) by mouth 2 (two) times daily.  30 capsule  3  . levothyroxine (SYNTHROID, LEVOTHROID) 125 MCG tablet Take 1 tablet (125 mcg total) by mouth daily.  30 tablet  4  . metolazone (ZAROXOLYN) 5 MG tablet Take 1 tablet (5 mg total) by mouth as needed (For 24 hour wt gain >=3 pounds). For edema  30 tablet  5  . metoprolol tartrate (LOPRESSOR) 12.5 mg TABS Take 1.5 tablets (37.5 mg total) by mouth 2 (two) times daily.  90 tablet  5  . Multiple Vitamin (MULTIVITAMIN) tablet Take 1 tablet by mouth daily.       . mupirocin ointment (BACTROBAN) 2 % Apply 1 application topically 2 (two) times daily. To leg blister  60 g  6  . nitroGLYCERIN (NITRODUR - DOSED IN MG/24 HR) 0.4 mg/hr Place 1 patch onto the skin every other day.      . nitroGLYCERIN (NITROSTAT) 0.4 MG SL tablet Place 0.4 mg under the tongue every 5 (five) minutes as needed. For chest pain      . potassium chloride SA (K-DUR,KLOR-CON) 20 MEQ tablet TAKE 2 TABLETS DAILY.  60 tablet  11  . sitaGLIPtin (JANUVIA) 100 MG tablet Take 1 tablet (100 mg total) by mouth daily.  30 tablet  5  . Torsemide (DEMADEX PO) Take 50 mg by mouth 2 (two) times daily.       . Vitamin D, Ergocalciferol, (DRISDOL) 50000 UNITS CAPS TAKE 1 CAPSULE BY MOUTH TWICE A WEEK (ON TUESDAYS AND FRIDAYS)  9 capsule  4  . zolpidem (AMBIEN) 10 MG tablet Take 10 mg by mouth at bedtime as needed. sleep       Assessment: 63 yo female admitted with c/o SOB.  She has multiple co-morbidities including atrial fib for which she is on chronic Warfarin therapy.  We have been  asked to assist dosing and monitoring of her anticoagulation while she is hospitalized.  Currently, her INR is 3.0 and at the upper end of goal range.  No noted bleeding complications.  She is also on oral Amiodarone which can potentiate the effects of  her INR.   HOME Dose: Warfarin (COUMADIN) 5 MG tablet Take 5 mg by mouth daily. 5 mg on even days. 2.5 mg on odd days    Goal of Therapy:  INR 2-3 Monitor platelets by anticoagulation protocol: Yes   Plan:  1.  Continue home regimen of Warfarin 5mg  on even days and 2.5mg  on odd days. 2.  Daily PT/INR for now. 3.  Monitor for drug/drug interactions and s/s of bleeding  Nadara Mustard, PharmD., MS Clinical Pharmacist Pager:  [phone] Thank you for allowing pharmacy to be part of this patients care team.  Chinita Greenland 06/02/2013,7:05 PM

## 2013-06-02 NOTE — Patient Instructions (Addendum)
Your physician has recommended that you have a Cardioversion (DCCV). Electrical Cardioversion uses a jolt of electricity to your heart either through paddles or wired patches attached to your chest. This is a controlled, usually prescheduled, procedure. Defibrillation is done under light anesthesia in the hospital, and you usually go home the day of the procedure. This is done to get your heart back into a normal rhythm. You are not awake for the procedure. Please see the instruction sheet given to you  You are being admitted to for Heart Failure please go to Admitting.Marland Kitchen

## 2013-06-02 NOTE — H&P (Signed)
[name] is an 63 y.o. female.   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 She is now roughly 6 months status post an elective direct current cardioversion and has successfully maintained normal rhythm. Interrogation of her pacemaker today shows underlying atrial fibrillation. The patient did under-go DCCV on 02/06/13.  She currently is 20# heavier And complaining of SOB, orthopnea and not being able to sleep.  She denies N, V, fever, CP, dizziness, ABD pain, hematuria, hematochezia.  Past Medical History  Diagnosis Date  . Personal history of other diseases of circulatory system   . 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 Chronic lymphocytic thyroiditis   . Morbid obesity   . Other chronic pulmonary heart diseases   . Obstructive sleep apnea (adult) (pediatric)   . Other and unspecified hyperlipidemia   . Fibromyalgia   . Goiter   . Thyroiditis, autoimmune   . Hypothyroidism, acquired, autoimmune   . Solitary kidney, acquired   . Fatigue   . Hyperparathyroidism, secondary   . Vitamin D deficiency disease   . Hyperparathyroidism   . H/O varicose veins 03/27/12  . Type II or unspecified type diabetes mellitus without mention of complication, not stated as uncontrolled   . Diabetes mellitus type II   . Infections of kidney 03/27/12  . History of measles, mumps, or rubella 03/27/12  . Tubal pregnancy 12/23/76  . Unspecified essential hypertension     20 yrs ago  . Blood transfusion 1977    [facility]  . Complication of anesthesia 2010    hard to wake up after knee surgery  . Anemia   . Cardiomyopathy, nonischemic     EF 45 %  . Endometrial hyperplasia with atypia  05/05/12  . Diastolic HF (heart failure) 11/05/2012  . A-fib   . Echocardiogram abnormal 07/15/12    severe MR,mod to severe TR,mod to severe PA hypertension  . Pulmonary arterial hypertension     Past Surgical History  Procedure Laterality Date  . Cholecystectomy    . Pacemaker placement  02/26/11    Medtronic Adapta  . Knee surgery    . Resection duplicate left kidney    . 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 Hernia repair  1999    ruptured ;  . D& c with hysteroscopy & cervical polypectomy  05/05/12  . Cardioversion N/A 02/06/2013    Successful  . Iud removal  01/30/13  . Ectopic pregnancy surgery  1978    Family History  Problem Relation Age of Onset  . Cardiomyopathy Brother   . Diabetes Mother   . Cancer Maternal Grandmother   . Heart attack Mother    Social History:  reports that she has never smoked. She has never used smokeless tobacco. She reports that she does not drink alcohol or use illicit drugs.  Allergies:  Allergies  Allergen Reactions  . Ivp Dye (Iodinated Diagnostic Agents) Shortness Of Breath    Turn red, can't breathe  . Betadine (Povidone Iodine)   . Diphenhydramine Hcl Swelling    In hands and eyes  . Fish Allergy Nausea And Vomiting  . Iohexol      Desc: PT TURNS RED AND WHEEZING   . Penicillins  Other (See Comments)    Resp arrest as child  . Povidone Other (See Comments)    Wheezing, turn red, can't breath, and blisters  . Promethazine Hcl Other (See Comments)    Low Blood Pressure  . Tape     Blisters, can use paper tape for short periods  . Clindamycin Hives and Rash    wheezing  . Iodine Rash  . Morphine Sulfate Nausea And Vomiting and Rash    Medications Prior to Admission  Medication Sig Dispense Refill  . Aclidinium Bromide (TUDORZA PRESSAIR) 400 MCG/ACT AEPB Inhale 1 puff into the lungs 2 (two) times daily.  1 each  0  . albuterol (PROVENTIL HFA;VENTOLIN HFA) 108 (90 BASE) MCG/ACT inhaler Inhale 2 puffs into the  lungs every 6 (six) hours as needed. For shortness of breath      . albuterol (PROVENTIL) (2.5 MG/3ML) 0.083% nebulizer solution Take 2.5 mg by nebulization every 6 (six) hours as needed. For shortness of breath      . ALPRAZolam (XANAX) 0.5 MG tablet Take 0.25 mg by mouth 3 (three) times daily as needed. For anxiety      . amiodarone (CORDARONE) 200 MG tablet Take 1 tablet (200 mg total) by mouth 2 (two) times daily.  60 tablet  6  . calcitRIOL (ROCALTROL) 0.25 MCG capsule Take 1 capsule (0.25 mcg total) by mouth 2 (two) times daily.  60 capsule  5  . calcium citrate-vitamin D 200-200 MG-UNIT TABS Take 1 tablet by mouth 4 (four) times daily -  before meals and at bedtime.      . digoxin (LANOXIN) 0.125 MG tablet Take 0.125 mg by mouth daily.      . DULoxetine (CYMBALTA) 30 MG capsule Take 30 mg by mouth 2 (two) times daily as needed. For pain      . glucose blood (ONE TOUCH ULTRA TEST) test strip Check sugar 6 x daily and per Protocols for Hypoglycemia & Hyperglycemia.  200 each  5  . glyBURIDE (DIABETA) 5 MG tablet Take 2 tablets in AM with breakfast and 1 tablet in PM  90 tablet  6  . HYDROcodone-acetaminophen (NORCO) 10-325 MG per tablet Take 1 tablet by mouth 2 (two) times daily.      . insulin glargine (LANTUS) 100 UNIT/ML injection Inject 30 Units into the skin at bedtime. Increase dose by one unit every 4 days until most AM BGs are in the 80-120 range.      . iron polysaccharides (NIFEREX) 150 MG capsule Take 1 capsule (150 mg total) by mouth 2 (two) times daily.  30 capsule  3  . levothyroxine (SYNTHROID, LEVOTHROID) 125 MCG tablet Take 1 tablet (125 mcg total) by mouth daily.  30 tablet  4  . metolazone (ZAROXOLYN) 5 MG tablet Take 1 tablet (5 mg total) by mouth as needed (For 24 hour wt gain >=3 pounds). For edema  30 tablet  5  . metoprolol tartrate (LOPRESSOR) 12.5 mg TABS Take 1.5 tablets (37.5 mg total) by mouth 2 (two) times daily.  90 tablet  5  . Multiple Vitamin (MULTIVITAMIN)  tablet Take 1 tablet by mouth daily.       . mupirocin ointment (BACTROBAN) 2 % Apply 1 application topically 2 (two) times daily. To leg blister  60 g  6  . nitroGLYCERIN (NITRODUR - DOSED IN MG/24 HR) 0.4 mg/hr Place 1 patch onto the skin every other day.      . nitroGLYCERIN (NITROSTAT) 0.4 MG SL tablet Place  0.4 mg under the tongue every 5 (five) minutes as needed. For chest pain      . potassium chloride SA (K-DUR,KLOR-CON) 20 MEQ tablet TAKE 2 TABLETS DAILY.  60 tablet  11  . sitaGLIPtin (JANUVIA) 100 MG tablet Take 1 tablet (100 mg total) by mouth daily.  30 tablet  5  . Torsemide (DEMADEX PO) Take 50 mg by mouth 2 (two) times daily.       . Vitamin D, Ergocalciferol, (DRISDOL) 50000 UNITS CAPS TAKE 1 CAPSULE BY MOUTH TWICE A WEEK (ON TUESDAYS AND FRIDAYS)  9 capsule  4  . warfarin (COUMADIN) 5 MG tablet Take 5 mg by mouth daily. 5 mg on even days. 2.5 mg on odd days Pacemaker- complete heart block and AF      . zolpidem (AMBIEN) 10 MG tablet Take 10 mg by mouth at bedtime as needed. sleep        Results for orders placed in visit on 06/02/13 (from the past 48 hour(s))  POCT INR     Status: None   Collection Time    06/02/13  3:34 PM      Result Value Range   INR 3.1     No results found.  Review of Systems  Constitutional: Negative for fever and diaphoresis.  Respiratory: Positive for shortness of breath. Negative for cough.   Cardiovascular: Positive for orthopnea, leg swelling and PND. Negative for chest pain and palpitations.  Gastrointestinal: Negative for nausea, vomiting, abdominal pain, blood in stool and melena.  Genitourinary: Negative for hematuria.  Neurological: Negative for dizziness and weakness.  All other systems reviewed and are negative.    Blood pressure 117/81, pulse 72, temperature 97.9 F (36.6 C), temperature source Oral, resp. rate 18, height 5\' 7"  (1.702 m), weight 354 lb 8 oz (160.8 kg), SpO2 94.00%. Physical Exam  Morbidly obese,  in no acute  distress HEENT: Pupils are equal round react to light accommodation extraocular movements are intact.  Neck: No cervical lymphadenopathy. Cardiac: Regular rate and rhythm without murmurs rubs or gallops. Lungs:  clear to auscultation bilaterally, mild left basilar rales Abd: soft, nontender, positive bowel sounds all quadrants,  Ext:   1+ radial and dorsalis pedis pulses. Skin: warm and dry Neuro:  Grossly normal  Assessment/Plan Principal Problem:   Acute on chronic diastolic HF (heart failure) Active Problems:   DIABETES MELLITUS   OBESITY, MORBID   OBSTRUCTIVE SLEEP APNEA   HYPERTENSION   ATRIAL FIBRILLATION, HX OF   PACEMAKER, PERMANENT   Cardiomyopathy, nonischemic  Plan:  The patient will be started on IV lasix 80mg  BID with plan for DCCV later in the week.  Checking PT/INR, TSH, BNP, BMET, CBC, CXR.  Monitor K+.  On supplements.    [name], [name] 06/02/2013, 6:39 PM   I have seen and examined the patient.  I have reviewed the chart, notes and new data.  I agree with PA's note.   Patient presented today for complaints of worsening shortness of breath.  She is severely dyspneic at rest with a respiratory rate of approximately 24 respirations per minute and an oxygen saturation of 87% on room marrow. She has been feeling ill now for at least a week. She has gained roughly 23 pounds in weight with considerable worsening of her already severe lower extremity edema. She has tried taking Zaroxolyn. This initially led to substantial diuresis but she has reaccumulated fluid and is now orthopneic and very edematous.  Interrogation of her dual-chamber pacemaker (Medtronic) shows  recurrent persistent atrial fibrillation. This began roughly 2 weeks before today's presentation, And is the first episode of persistent atrial fibrillation since her cardioversion in January. Secondary to under sensing of the very fine fibrillation waves the device reports that the atrial fibrillation has been  occurring off and on over the last 2 weeks, but I suspect it has actually been one continuous episode. Ventricular rates have not been fast.  This has occurred even though she is on amiodarone 200 mg twice daily. She has been therapeutically anticoagulated with INR greater than 2 ON the last 3 INR checks.  Significant comorbidities of morbid obesity, obstructive sleep apnea and pulmonary arterial hypertension, multifactorial anemia including iron deficiency currently being treated with intravenous iron, chronic kidney disease stage 3-4, diabetes mellitus type 2 requiring insulin, hyperparathyroidism and monoclonal gammopathy of uncertain significance.  She'll be admitted for intravenous diuretics, oxygen, planned cardioversion once her respiratory status has improved (probably no sooner than Thursday). Since she has been fully anticoagulated a transesophageal echocardiogram will be not be necessary. Note that she is organized those of amiodarone. Consider electrophysiology evaluation.         Thurmon Fair, MD, [facility] [phone] 06/02/2013, 8:17 PM

## 2013-06-03 ENCOUNTER — Encounter (HOSPITAL_COMMUNITY): Payer: Self-pay | Admitting: Cardiology

## 2013-06-03 ENCOUNTER — Encounter (HOSPITAL_COMMUNITY): Payer: Self-pay | Admitting: Anesthesiology

## 2013-06-03 ENCOUNTER — Inpatient Hospital Stay (HOSPITAL_COMMUNITY): Payer: Medicare Other

## 2013-06-03 DIAGNOSIS — E063 Autoimmune thyroiditis: Secondary | ICD-10-CM

## 2013-06-03 DIAGNOSIS — I059 Rheumatic mitral valve disease, unspecified: Secondary | ICD-10-CM

## 2013-06-03 DIAGNOSIS — Z7901 Long term (current) use of anticoagulants: Secondary | ICD-10-CM

## 2013-06-03 LAB — BASIC METABOLIC PANEL
Calcium: 9.9 mg/dL (ref 8.4–10.5)
Chloride: 96 mEq/L (ref 96–112)
Creatinine, Ser: 2.21 mg/dL — ABNORMAL HIGH (ref 0.50–1.10)
GFR calc Af Amer: 26 mL/min — ABNORMAL LOW (ref >90)
GFR calc non Af Amer: 22 mL/min — ABNORMAL LOW (ref >90)

## 2013-06-03 LAB — PACEMAKER DEVICE OBSERVATION
AL IMPEDENCE PM: 319 Ohm
BAMS-0001: 150 {beats}/min
BRDY-0003RV: 130 {beats}/min
RV LEAD AMPLITUDE: 5.6 mv
RV LEAD IMPEDENCE PM: 332 Ohm
RV LEAD THRESHOLD: 1.25 V

## 2013-06-03 LAB — TSH: TSH: 2.386 u[IU]/mL (ref 0.350–4.500)

## 2013-06-03 LAB — GLUCOSE, CAPILLARY: Glucose-Capillary: 103 mg/dL — ABNORMAL HIGH (ref 70–99)

## 2013-06-03 MED ORDER — SODIUM CHLORIDE 0.9 % IV SOLN: 250.0000 mL | INTRAVENOUS | Status: DC

## 2013-06-03 MED ORDER — MUPIROCIN CALCIUM 2 % EX CREA
TOPICAL_CREAM | Freq: Two times a day (BID) | CUTANEOUS | Status: DC
  Administered 2013-06-03 – 2013-06-04 (×2): via TOPICAL
  Filled 2013-06-03 (×2): qty 15

## 2013-06-03 MED ORDER — HYDROCORTISONE 1 % EX CREA
1.0000 "application " | TOPICAL_CREAM | Freq: Three times a day (TID) | CUTANEOUS | Status: DC | PRN
  Filled 2013-06-03: qty 28

## 2013-06-03 MED ORDER — SODIUM CHLORIDE 0.9 % IJ SOLN
3.0000 mL | Freq: Two times a day (BID) | INTRAMUSCULAR | Status: DC
  Administered 2013-06-03 – 2013-06-11 (×9): 3 mL via INTRAVENOUS

## 2013-06-03 MED ORDER — SODIUM CHLORIDE 0.9 % IJ SOLN: 3.0000 mL | INTRAMUSCULAR | Status: DC | PRN

## 2013-06-03 MED ORDER — WARFARIN SODIUM 5 MG PO TABS
5.0000 mg | ORAL_TABLET | ORAL | Status: DC
  Administered 2013-06-04 – 2013-06-08 (×3): 5 mg via ORAL
  Filled 2013-06-03 (×5): qty 1

## 2013-06-03 MED ORDER — GLYBURIDE 5 MG PO TABS
10.0000 mg | ORAL_TABLET | Freq: Every day | ORAL | Status: DC
  Administered 2013-06-04 – 2013-06-11 (×8): 10 mg via ORAL
  Filled 2013-06-03 (×10): qty 2

## 2013-06-03 MED ORDER — GLYBURIDE 5 MG PO TABS
5.0000 mg | ORAL_TABLET | Freq: Once | ORAL | Status: DC
  Filled 2013-06-03: qty 1

## 2013-06-03 MED ORDER — GLYBURIDE 5 MG PO TABS
5.0000 mg | ORAL_TABLET | Freq: Every day | ORAL | Status: DC
  Administered 2013-06-03 – 2013-06-10 (×8): 5 mg via ORAL
  Filled 2013-06-03 (×9): qty 1

## 2013-06-03 NOTE — Progress Notes (Signed)
  Echocardiogram 2D Echocardiogram has been performed.  [name] FRANCES 06/03/2013, 6:38 PM

## 2013-06-03 NOTE — Consult Note (Signed)
WOC consult Note Reason for Consult: Consult requested for bilat leg wounds.  States a ceiling and insulation caved in in December and got into her leg and she has had several chronic areas which blister, scab, or evolve into full thickness wounds which burn since this occurred.  These sites become worse when her legs swell. Wound type: Left calf full thickness stasis ulcer .5X2.5X.2cm; 100% red, mod yellow drainage, no odor Bilat legs with patchy areas of multiple scabs and small patchy areas of partial thickness skin loss. Dressing procedure/placement/frequency: She has been using Bactroban which helps promote healing.  Continue present plan of care.  Foam dressing to protect left leg wound.  Pt prefers to perform her own wound care. Supplies ordered to bedside. Please re-consult if further assistance is needed.  Thank-you,  Cammie Mcgee MSN, RN, CWOCN, [place], CNS [phone]

## 2013-06-03 NOTE — Progress Notes (Signed)
Utilization Review Completed Jacquelyne Quarry J. Sumner Boesch, RN, BSN, NCM [phone]  

## 2013-06-03 NOTE — Progress Notes (Signed)
Subjective: Concerned about diabetic meds, and diet. No chest pain, some SOB.  Objective: Vital signs in last 24 hours: Temp:  [97.4 F (36.3 C)-97.9 F (36.6 C)] 97.4 F (36.3 C) (06/04 0609) Pulse Rate:  [70-81] 70 (06/04 0902) Resp:  [18] 18 (06/04 0609) BP: (100-117)/(60-81) 104/62 mmHg (06/04 0902) SpO2:  [94 %-98 %] 95 % (06/04 0609) Weight:  [352 lb 4.7 oz (159.8 kg)-358 lb 6.4 oz (162.569 kg)] 352 lb 4.7 oz (159.8 kg) (06/04 0500) Weight change:  Last BM Date: 06/02/13 Intake/Output from previous day:  -2385 (since admit -2865)  Wt 352.4 today down from 354.8 lbs. 06/03 0701 - 06/04 0700 In: 240 [P.O.:240] Out: 2625 [Urine:2625] Intake/Output this shift: Total I/O In: 120 [P.O.:120] Out: 600 [Urine:600]  PE: General:Pleasant affect, NAD Skin:Warm and dry, brisk capillary refill Neck:supple, no JVD sitting up  Heart: RRR without murmur, AV pacing at times on monitor, gallup, rub or click Lungs:clear without rales, rhonchi, or wheezes FIE:PPIRJ, soft, non tender, + BS, do not palpate liver spleen or masses Ext:+3-4 lower ext edema with small ulcers from fiberglass but Lt shin with more open wound and drainage., 2+ radial pulses Neuro:alert and oriented, MAE, follows commands   Lab Results:  Recent Labs  06/02/13 1938  WBC 6.5  HGB 11.7*  HCT 36.6  PLT 157   BMET  Recent Labs  06/02/13 1938 06/03/13 0500  NA 141 141  K 4.0 3.8  CL 98 96  CO2 32 34*  GLUCOSE 98 79  BUN 50* 49*  CREATININE 2.12* 2.21*  CALCIUM 9.8 9.9   No results found for this basename: TROPONINI, CK, MB,  in the last 72 hours  No results found for this basename: CHOL, HDL, LDLCALC, LDLDIRECT, TRIG, CHOLHDL   Lab Results  Component Value Date   HGBA1C 6.9 05/26/2013     Lab Results  Component Value Date   TSH 2.386 06/02/2013      EKG: Orders placed in visit on 04/07/13  . EKG 12-LEAD    Studies/Results: Dg Chest 2 View  06/02/2013   *RADIOLOGY REPORT*   Clinical Data: CHF  CHEST - 2 VIEW  Comparison: 11/04/2012  Findings: Right-sided dual lead pacer in place.  Moderate to marked enlargement cardiomediastinal silhouette again noted. Lung volumes are low with crowding of the bronchovascular markings.  No pleural effusion.  No focal pulmonary opacity.  IMPRESSION: Cardiomegaly without acute abnormality. If the patient's symptoms continue, consider PA and lateral chest radiographs obtained at full inspiration when the patient is clinically able.   Original Report Authenticated By: Christiana Pellant, M.D.    Medications: I have reviewed the patient's current medications. Scheduled Meds: . amiodarone  200 mg Oral BID  . calcitRIOL  0.25 mcg Oral BID  . digoxin  0.125 mg Oral Daily  . furosemide  80 mg Intravenous BID  . glyBURIDE  5 mg Oral BID WC  . HYDROcodone-acetaminophen  1 tablet Oral BID  . insulin aspart  0-20 Units Subcutaneous TID WC  . insulin glargine  30 Units Subcutaneous QHS  . iron polysaccharides  150 mg Oral BID  . levothyroxine  125 mcg Oral QAC breakfast  . linagliptin  5 mg Oral Daily  . metoprolol tartrate  37.5 mg Oral BID  . mupirocin ointment  1 application Topical BID  . nitroGLYCERIN  0.4 mg Transdermal QODAY  . potassium chloride SA  20 mEq Oral BID  . sodium chloride  3 mL Intravenous Q12H  . [  START ON 06/04/2013] Vitamin D (Ergocalciferol)  50,000 Units Oral 2 times weekly  . warfarin  2.5 mg Oral QODAY  . [START ON 06/04/2013] warfarin  5 mg Oral QODAY  . Warfarin - Pharmacist Dosing Inpatient   Does not apply q1800   Continuous Infusions:  PRN Meds:.sodium chloride, acetaminophen, albuterol, ALPRAZolam, DULoxetine, nitroGLYCERIN, ondansetron (ZOFRAN) IV, sodium chloride, zolpidem  Assessment/Plan: Principal Problem:   Acute on chronic diastolic HF (heart failure) Active Problems:   DIABETES MELLITUS   OBESITY, MORBID   OBSTRUCTIVE SLEEP APNEA   HYPERTENSION   ATRIAL FIBRILLATION, HX OF   PACEMAKER,  PERMANENT   Cardiomyopathy, nonischemic  PLAN:on PPM interrogation she was in atrial fib, began 2 weeks prior to admit. 1st episode since DCCV in 12/2012.  Admitted yesterday from the office for diuresing, oxygen and DCCV once resp. Status permits.  She has been anticoagulated with therapeutic INRs on last 3 checks.  Glucose stable. Cr. Elevated, in March and last NOV. Was 1.69 to 1.90.  CXR with increase of cardiomegaly.  ? Repeat echo.  Check PA and lat CXR.  Adjust diet and adjust diabetic meds. Glucose stable overnight but 179 this am.   LOS: 1 day   Time spent with pt. :20 minutes. [facility] R  Nurse Practitioner Certified Pager [phone] 06/03/2013, 10:13 AM  I have seen and evaluated the patient this PM along with Nada Boozer, NP. I agree with her findings, examination as well as impression recommendations.  Seems to be diuresing well (mild bump in Cr).  Still profound edema, but also orthopnea.  Exam notable for some JVP elevation.  Heart does sound RRR (probably paced).  I do agree that cardiomegaly on CXR is dramatic -- check  Echo & PA-Lat CXR.  Plan for now is DCCV from Afib in AM (will need device rep present for interrogation to ensure conversion) -- has been on staed [facility] & Amiodarone.  Monitor DM control -- adjust meds based upon CBGs. BP is borderline -- would be nice (but with Afib as a likely inciting event, not currently possible) to decrease BB in the acute HF setting & add afterload reduction.  Will need several days of diuresis -- hopefully restoring NSR will help.  MD Time with pt: 10 min  Tevion Laforge W, M.D., M.S. [facility] [address] [place], [place]  [address]  [phone] Pager # [phone] 06/03/2013 1:02 PM

## 2013-06-03 NOTE — Progress Notes (Signed)
ANTICOAGULATION CONSULT NOTE - Follow-up  Pharmacy Consult for Warfarin Indication: atrial fibrillation  Allergies  Allergen Reactions  . Ivp Dye (Iodinated Diagnostic Agents) Shortness Of Breath    Turn red, can't breathe  . Betadine (Povidone Iodine)   . Diphenhydramine Hcl Swelling    In hands and eyes  . Fish Allergy Nausea And Vomiting  . Iohexol      Desc: PT TURNS RED AND WHEEZING   . Penicillins Other (See Comments)    Resp arrest as child  . Povidone Other (See Comments)    Wheezing, turn red, can't breath, and blisters  . Promethazine Hcl Other (See Comments)    Low Blood Pressure  . Tape     Blisters, can use paper tape for short periods  . Clindamycin Hives and Rash    wheezing  . Iodine Rash  . Morphine Sulfate Nausea And Vomiting and Rash   Patient Measurements: Height: 5\' 7"  (170.2 cm) Weight: 352 lb 4.7 oz (159.8 kg) IBW/kg (Calculated) : 61.6  Vital Signs: Temp: 97.4 F (36.3 C) (06/04 0609) Temp src: Oral (06/04 0609) BP: 104/62 mmHg (06/04 0902) Pulse Rate: 70 (06/04 0902)  Labs:  Recent Labs  06/02/13 1534 06/02/13 1938 06/03/13 0500  HGB  --  11.7*  --   HCT  --  36.6  --   PLT  --  157  --   LABPROT  --   --  26.2*  INR 3.1  --  2.55*  CREATININE  --  2.12* 2.21*   Estimated Creatinine Clearance: 41 ml/min (by C-G formula based on Cr of 2.21).  Assessment: 63 yo female admitted with c/o SOB. She continues on warfarin for afib. Her INR is therapeutic at 2.55 on her home regimen. No new CBC today, no bleeding noted.    Goal of Therapy:  INR 2-3   Plan:  1. Continue home regimen of alternating 2.5mg  and 5mg  of warfarin 2. F/u AM INR  Lysle Pearl, PharmD, BCPS Pager # [phone] 06/03/2013 9:49 AM

## 2013-06-04 ENCOUNTER — Telehealth: Payer: Self-pay | Admitting: *Deleted

## 2013-06-04 ENCOUNTER — Inpatient Hospital Stay (HOSPITAL_COMMUNITY): Admission: RE | Admit: 2013-06-04 | Payer: Medicare Other | Source: Ambulatory Visit | Admitting: Cardiology

## 2013-06-04 ENCOUNTER — Encounter (HOSPITAL_COMMUNITY): Payer: Self-pay | Admitting: Anesthesiology

## 2013-06-04 ENCOUNTER — Encounter (HOSPITAL_COMMUNITY)
Admission: AD | Disposition: A | Discharge status: Home / Self Care | Payer: Self-pay | Source: Ambulatory Visit | Attending: Cardiovascular Disease

## 2013-06-04 DIAGNOSIS — L97909 Non-pressure chronic ulcer of unspecified part of unspecified lower leg with unspecified severity: Secondary | ICD-10-CM

## 2013-06-04 LAB — PROTIME-INR
INR: 2.75 — ABNORMAL HIGH (ref 0.00–1.49)
Prothrombin Time: 27.7 seconds — ABNORMAL HIGH (ref 11.6–15.2)

## 2013-06-04 LAB — GLUCOSE, CAPILLARY
Glucose-Capillary: 159 mg/dL — ABNORMAL HIGH (ref 70–99)
Glucose-Capillary: 172 mg/dL — ABNORMAL HIGH (ref 70–99)

## 2013-06-04 LAB — PRO B NATRIURETIC PEPTIDE: Pro B Natriuretic peptide (BNP): 3645 pg/mL — ABNORMAL HIGH (ref 0–125)

## 2013-06-04 LAB — BASIC METABOLIC PANEL
BUN: 56 mg/dL — ABNORMAL HIGH (ref 6–23)
Creatinine, Ser: 2.29 mg/dL — ABNORMAL HIGH (ref 0.50–1.10)
GFR calc Af Amer: 25 mL/min — ABNORMAL LOW (ref >90)
GFR calc non Af Amer: 21 mL/min — ABNORMAL LOW (ref >90)

## 2013-06-04 SURGERY — CARDIOVERSION
Anesthesia: Monitor Anesthesia Care | Wound class: Clean

## 2013-06-04 NOTE — Telephone Encounter (Signed)
TC to husband to inform per Dr. Fransico Michael that labs from 05-26-2013 show her iron level low at 36, her ferritin low-normal at 19, her UIBC and TIBC high at 464 and 500 respectively, and her % iron saturation low at 7. Husband said that she is at the hospital today. Joylene Grapes,

## 2013-06-04 NOTE — Progress Notes (Signed)
Subjective: No chest pain, some SOB. Still notes "increased Atrial rate" & remains orthopneic, but overall less SOB.  Objective: Vital signs in last 24 hours: Temp:  [97.5 F (36.4 C)-98 F (36.7 C)] 98 F (36.7 C) (06/05 0518) Pulse Rate:  [70-72] 71 (06/05 0518) Resp:  [16-18] 18 (06/05 0518) BP: (104-121)/(63-76) 121/72 mmHg (06/05 0518) SpO2:  [93 %-98 %] 95 % (06/05 0518) Weight:  [351 lb 6.4 oz (159.394 kg)] 351 lb 6.4 oz (159.394 kg) (06/05 0518) Weight change: -3 lb 1.6 oz (-1.406 kg) Last BM Date: 06/04/13 Intake/Output from previous day:  -3177 (since admit -5602+)  Wt 351.6 today down from 354.8 lbs. 06/04 0701 - 06/05 0700 In: 948 [P.O.:942; I.V.:6] Out: 4125 [Urine:4125] Intake/Output this shift: Total I/O In: 360 [P.O.:360] Out: 400 [Urine:400]  PE: General:Pleasant affect, NAD Skin:Warm and dry, brisk capillary refill Neck:supple, +JVD - with HJR (~12-14 cm H2O) sitting up  Heart: RRR without murmur, AV pacing at times on monitor, gallup, rub or click Lungs:clear without rales, rhonchi, or wheezes ZOX:WRUEA, soft, non tender, + BS, do not palpate liver spleen or masses Ext:+3-4 LEE with small ulcers from fiberglass but Lt shin with more open wound and drainage., 2+ radial pulses, unable to palpate LE pulses. Neuro:alert and oriented, MAE, follows commands   Lab Results:  Recent Labs  06/02/13 1938  WBC 6.5  HGB 11.7*  HCT 36.6  PLT 157   BMET  Recent Labs  06/03/13 0500 06/04/13 0455  NA 141 136  K 3.8 3.8  CL 96 92*  CO2 34* 32  GLUCOSE 79 137*  BUN 49* 56*  CREATININE 2.21* 2.29*  CALCIUM 9.9 9.3   Lab Results  Component Value Date   HGBA1C 6.9 05/26/2013     Lab Results  Component Value Date   TSH 2.386 06/02/2013   Studies/Results: - both images personally reviewed Dg Chest 2 View  06/03/2013   *RADIOLOGY REPORT*  Clinical Data: Shortness of breath.  CHEST - 2 VIEW  Comparison: 06/03/2003.  Findings: The pacer wires are stable.   The heart is enlarged but unchanged.  The lungs are clear.  No pleural effusion.  The bony thorax is intact.  IMPRESSION: Stable cardiac enlargement. No acute pulmonary findings.   Original Report Authenticated By: Rudie Meyer, M.D.   Dg Chest 2 View  06/02/2013   *RADIOLOGY REPORT*  Clinical Data: CHF  CHEST - 2 VIEW  Comparison: 11/04/2012  Findings: Right-sided dual lead pacer in place.  Moderate to marked enlargement cardiomediastinal silhouette again noted. Lung volumes are low with crowding of the bronchovascular markings.  No pleural effusion.  No focal pulmonary opacity.  IMPRESSION: Cardiomegaly without acute abnormality. If the patient's symptoms continue, consider PA and lateral chest radiographs obtained at full inspiration when the patient is clinically able.   Original Report Authenticated By: Christiana Pellant, M.D.    Medications: I have reviewed the patient's current medications. Scheduled Meds: . amiodarone  200 mg Oral BID  . calcitRIOL  0.25 mcg Oral BID  . digoxin  0.125 mg Oral Daily  . furosemide  80 mg Intravenous BID  . glyBURIDE  10 mg Oral Q breakfast  . glyBURIDE  5 mg Oral Q supper  . glyBURIDE  5 mg Oral Once  . HYDROcodone-acetaminophen  1 tablet Oral BID  . insulin aspart  0-20 Units Subcutaneous TID WC  . insulin glargine  30 Units Subcutaneous QHS  . iron polysaccharides  150 mg Oral BID  .  levothyroxine  125 mcg Oral QAC breakfast  . linagliptin  5 mg Oral Daily  . metoprolol tartrate  37.5 mg Oral BID  . mupirocin cream   Topical BID  . nitroGLYCERIN  0.4 mg Transdermal QODAY  . potassium chloride SA  20 mEq Oral BID  . sodium chloride  3 mL Intravenous Q12H  . sodium chloride  3 mL Intravenous Q12H  . Vitamin D (Ergocalciferol)  50,000 Units Oral 2 times weekly  . warfarin  2.5 mg Oral QODAY  . warfarin  5 mg Oral QODAY  . Warfarin - Pharmacist Dosing Inpatient   Does not apply q1800   Continuous Infusions: . sodium chloride     PRN Meds:.sodium  chloride, acetaminophen, albuterol, ALPRAZolam, DULoxetine, hydrocortisone cream, nitroGLYCERIN, ondansetron (ZOFRAN) IV, sodium chloride, sodium chloride, zolpidem  Assessment/Plan: Principal Problem:   Acute on chronic diastolic HF (heart failure) Active Problems:   DIABETES MELLITUS   OBESITY, MORBID   OBSTRUCTIVE SLEEP APNEA   HYPERTENSION   PACEMAKER, PERMANENT   Solitary kidney, acquired   Cardiomyopathy, nonischemic   PAF (paroxysmal atrial fibrillation), 1st episode since DCCV in Jan/14  PPM interrogation she was in atrial fib (confirmed this AM), began 2 weeks prior to admit. 1st episode since DCCV in 12/2012.  2 days ago from the office for diuresing, oxygen and DCCV once resp. status permits.   She has been anticoagulated with therapeutic INRs on last 3 checks & continues to be on Amiodarone.  Would like to cancel DCCV today -- & do tomorrow; husband had unexpected family emergency that took him out of town.  TSH = normal  Glycemic control has been stable. (100-140s o/n) - A1c looks pretty good. WOCRN came by to give recs re LLE wound / dressing -- Pt contemplating their recommendation, but want to "do it herself" -- wounds look stable.  Cr. Elevated, in March and last NOV. Was 1.69 to 1.90. - anticipate that she is arterially pre-renal, but clearly volume overloaded.    Seems to be diuresing well (mild bump in Cr).  Still profound edema, but also orthopnea  Will continue with current diuresis rate.  Will need several days of diuresis -- hopefully restoring NSR will help.  BP is more stable -- on BB.  Is not on afterload reductions (has been borderline BP for the last few days, may consider adding prior to d/c -- no ACE-I/ARB due to elevated Cr. Will defer to primary cardiologist & pending DCCV.   LOS: 2 days   MD Time with pt: 20 min, 15 min with chart.   Marykay Lex, M.D., M.S. [facility] [address] [place],  [place]  [address]  [phone] Pager # [phone] 06/04/2013 9:11 AM

## 2013-06-04 NOTE — Progress Notes (Signed)
ANTICOAGULATION CONSULT NOTE - Follow-up  Pharmacy Consult for Warfarin Indication: atrial fibrillation  Allergies  Allergen Reactions  . Ivp Dye (Iodinated Diagnostic Agents) Shortness Of Breath    Turn red, can't breathe  . Betadine (Povidone Iodine)   . Diphenhydramine Hcl Swelling    In hands and eyes  . Fish Allergy Nausea And Vomiting  . Iohexol      Desc: PT TURNS RED AND WHEEZING   . Penicillins Other (See Comments)    Resp arrest as child  . Povidone Other (See Comments)    Wheezing, turn red, can't breath, and blisters  . Promethazine Hcl Other (See Comments)    Low Blood Pressure  . Tape     Blisters, can use paper tape for short periods  . Clindamycin Hives and Rash    wheezing  . Iodine Rash  . Morphine Sulfate Nausea And Vomiting and Rash   Patient Measurements: Height: 5\' 7"  (170.2 cm) Weight: 351 lb 6.4 oz (159.394 kg) (a scale) IBW/kg (Calculated) : 61.6  Vital Signs: Temp: 98 F (36.7 C) (06/05 0518) Temp src: Oral (06/05 0518) BP: 121/72 mmHg (06/05 0518) Pulse Rate: 71 (06/05 0518)  Labs:  Recent Labs  06/02/13 1534 06/02/13 1938 06/03/13 0500 06/04/13 0455  HGB  --  11.7*  --   --   HCT  --  36.6  --   --   PLT  --  157  --   --   LABPROT  --   --  26.2* 27.7*  INR 3.1  --  2.55* 2.75*  CREATININE  --  2.12* 2.21* 2.29*   Estimated Creatinine Clearance: 39.5 ml/min (by C-G formula based on Cr of 2.29).  Assessment: 63 yo female admitted with c/o SOB. She continues on warfarin for afib. Her INR is therapeutic at 2.75 on her home regimen. No new CBC today, no bleeding noted.    Goal of Therapy:  INR 2-3   Plan:  1. Continue home regimen of alternating 2.5mg  and 5mg  of warfarin - may need to adjust if INR continues to trend up 2. F/u AM INR  Lysle Pearl, PharmD, BCPS Pager # [phone] 06/04/2013 8:18 AM

## 2013-06-04 NOTE — Anesthesia Preprocedure Evaluation (Signed)
Anesthesia Evaluation    History of Anesthesia Complications (+) PROLONGED EMERGENCE  Airway       Dental   Pulmonary shortness of breath, sleep apnea ,          Cardiovascular hypertension, Pt. on medications and Pt. on home beta blockers + dysrhythmias Atrial Fibrillation     Neuro/Psych  Neuromuscular disease    GI/Hepatic   Endo/Other  diabetes, Poorly Controlled, Type 2Hypothyroidism   Renal/GU Renal disease     Musculoskeletal  (+) Fibromyalgia -  Abdominal   Peds  Hematology   Anesthesia Other Findings   Reproductive/Obstetrics                           Anesthesia Physical Anesthesia Plan Anesthesia Quick Evaluation

## 2013-06-04 NOTE — Anesthesia Postprocedure Evaluation (Signed)
  Anesthesia Post-op Note  Case cancelled 

## 2013-06-04 NOTE — Progress Notes (Signed)
Seen that patient had orders placed for cardioversion in am. Patient has refused cardioversion at this time.Provider on call Dr. Terressa Koyanagi was called and notified at 2127 that patient is refusing cardioversion.

## 2013-06-05 ENCOUNTER — Other Ambulatory Visit: Payer: Self-pay

## 2013-06-05 ENCOUNTER — Encounter: Payer: Self-pay | Admitting: Cardiovascular Disease

## 2013-06-05 ENCOUNTER — Encounter (HOSPITAL_COMMUNITY): Payer: Self-pay | Admitting: Cardiology

## 2013-06-05 DIAGNOSIS — I480 Paroxysmal atrial fibrillation: Secondary | ICD-10-CM | POA: Insufficient documentation

## 2013-06-05 DIAGNOSIS — L97919 Non-pressure chronic ulcer of unspecified part of right lower leg with unspecified severity: Secondary | ICD-10-CM

## 2013-06-05 DIAGNOSIS — N184 Chronic kidney disease, stage 4 (severe): Secondary | ICD-10-CM

## 2013-06-05 HISTORY — DX: Non-pressure chronic ulcer of unspecified part of right lower leg with unspecified severity: L97.919

## 2013-06-05 LAB — GLUCOSE, CAPILLARY
Glucose-Capillary: 108 mg/dL — ABNORMAL HIGH (ref 70–99)
Glucose-Capillary: 132 mg/dL — ABNORMAL HIGH (ref 70–99)

## 2013-06-05 LAB — PROTIME-INR
INR: 2.71 — ABNORMAL HIGH (ref 0.00–1.49)
Prothrombin Time: 27.4 seconds — ABNORMAL HIGH (ref 11.6–15.2)

## 2013-06-05 LAB — BASIC METABOLIC PANEL
Calcium: 9.4 mg/dL (ref 8.4–10.5)
Creatinine, Ser: 2.5 mg/dL — ABNORMAL HIGH (ref 0.50–1.10)
GFR calc Af Amer: 22 mL/min — ABNORMAL LOW (ref >90)
GFR calc non Af Amer: 19 mL/min — ABNORMAL LOW (ref >90)
Sodium: 136 mEq/L (ref 135–145)

## 2013-06-05 MED ORDER — MUPIROCIN 2 % EX OINT
TOPICAL_OINTMENT | Freq: Two times a day (BID) | CUTANEOUS | Status: DC
  Administered 2013-06-05 – 2013-06-11 (×13): via NASAL
  Filled 2013-06-05: qty 22

## 2013-06-05 MED ORDER — DOXYCYCLINE HYCLATE 100 MG PO TABS
100.0000 mg | ORAL_TABLET | Freq: Two times a day (BID) | ORAL | Status: DC
  Administered 2013-06-05 – 2013-06-11 (×13): 100 mg via ORAL
  Filled 2013-06-05 (×15): qty 1

## 2013-06-05 NOTE — Progress Notes (Signed)
ANTICOAGULATION CONSULT NOTE - Follow-up  Pharmacy Consult for Warfarin Indication: atrial fibrillation  Allergies  Allergen Reactions  . Ivp Dye (Iodinated Diagnostic Agents) Shortness Of Breath    Turn red, can't breathe  . Betadine (Povidone Iodine)   . Diphenhydramine Hcl Swelling    In hands and eyes  . Fish Allergy Nausea And Vomiting  . Iohexol      Desc: PT TURNS RED AND WHEEZING   . Penicillins Other (See Comments)    Resp arrest as child  . Povidone Other (See Comments)    Wheezing, turn red, can't breath, and blisters  . Promethazine Hcl Other (See Comments)    Low Blood Pressure  . Tape     Blisters, can use paper tape for short periods  . Clindamycin Hives and Rash    wheezing  . Iodine Rash  . Morphine Sulfate Nausea And Vomiting and Rash   Patient Measurements: Height: 5\' 7"  (170.2 cm) Weight: 350 lb 14.4 oz (159.167 kg) (a scale) IBW/kg (Calculated) : 61.6  Vital Signs: Temp: 98.2 F (36.8 C) (06/06 0630) Temp src: Oral (06/06 0630) BP: 110/77 mmHg (06/06 0630) Pulse Rate: 71 (06/06 0630)  Labs:  Recent Labs  06/02/13 1938 06/03/13 0500 06/04/13 0455 06/05/13 0450  HGB 11.7*  --   --   --   HCT 36.6  --   --   --   PLT 157  --   --   --   LABPROT  --  26.2* 27.7* 27.4*  INR  --  2.55* 2.75* 2.71*  CREATININE 2.12* 2.21* 2.29* 2.50*   Estimated Creatinine Clearance: 36.1 ml/min (by C-G formula based on Cr of 2.5).  Assessment: 63 yo female admitted with c/o SOB. She continues on warfarin for afib. Her INR remains therapeutic at 2.71 on her home regimen. No new CBC today, no bleeding noted.    Goal of Therapy:  INR 2-3   Plan:  1. Continue home regimen of alternating 2.5mg  and 5mg  of warfarin 2. F/u AM INR  Lysle Pearl, PharmD, BCPS Pager # [phone] 06/05/2013 8:44 AM

## 2013-06-05 NOTE — Progress Notes (Signed)
Nutrition Brief Note  Received consult due to pt upset about diet choices.  Per pt her diet is now correct. Pt very knowledgeable about DM/low sodium diet. Pt can verbalize label reading and sodium/CHO limits. Unable to determine pt's compliance with these guidelines. Per pt she has been decreasing her DM medications PTA. Declines any additional education at this time.   Body mass index is 54.95 kg/(m^2). 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   Current diet order is CHO Modified, 2gm Na, 1500 ml fluid restriction, patient is consuming approximately 100% of meals at this time. Labs and medications reviewed.   No nutrition interventions warranted at this time. If nutrition issues arise, please consult RD.   Kendell Bane RD, LDN, CNSC [phone] Pager [phone] After Hours Pager

## 2013-06-05 NOTE — Progress Notes (Addendum)
Unable to do DCCV today.  Plan will to do on Monday either as in patient or out patient.  Discussed with Dr. Royann Shivers.  DCCV is scheduled for Monday 06/08/13 at 2:00 pm

## 2013-06-05 NOTE — Progress Notes (Signed)
Subjective: + anxiety, tearful at times.  Concerned about legs and diet.   She does not want Home Health and she has a good understanding of CHF.  Objective: Vital signs in last 24 hours: Temp:  [97.6 F (36.4 C)-98.2 F (36.8 C)] 98.2 F (36.8 C) (06/06 0630) Pulse Rate:  [71-73] 73 (06/06 1039) Resp:  [18-20] 20 (06/06 1039) BP: (106-112)/(60-87) 107/66 mmHg (06/06 1039) SpO2:  [93 %-99 %] 93 % (06/06 1039) Weight:  [350 lb 14.4 oz (159.167 kg)] 350 lb 14.4 oz (159.167 kg) (06/06 0630) Weight change: -8 oz (-0.227 kg) Last BM Date: 06/04/13 Intake/Output from previous day:  -1787 today (-7349 since admit)  Wt 350.4 down from 354 on admit.   06/05 0701 - 06/06 0700 In: 963 [P.O.:960; I.V.:3] Out: 2750 [Urine:2750] Intake/Output this shift: Total I/O In: 0  Out: 350 [Urine:350]  PE: General:Pleasant affect, NAD, + anxious, tearful Skin:Warm and dry, brisk capillary refill, legs with reddened areas around wounds. Neck:supple, no JVD sitting up  Heart:irreg irreg without murmur, gallup, rub or click Lungs:clear without rales, rhonchi, or wheezes ZOX:WRUEA, soft, non tender, + BS, do not palpate liver spleen or masses Ext:+ lower ext edema though improved, feet still swollen,  Small wounds on legs.-- wound care has evaluated and pt doing own wound care. Neuro:alert and oriented, MAE, follows commands   Lab Results:  Recent Labs  06/02/13 1938  WBC 6.5  HGB 11.7*  HCT 36.6  PLT 157   BMET  Recent Labs  06/04/13 0455 06/05/13 0450  NA 136 136  K 3.8 3.8  CL 92* 91*  CO2 32 32  GLUCOSE 137* 154*  BUN 56* 63*  CREATININE 2.29* 2.50*  CALCIUM 9.3 9.4   No results found for this basename: TROPONINI, CK, MB,  in the last 72 hours  No results found for this basename: CHOL,  HDL,  LDLCALC,  LDLDIRECT,  TRIG,  CHOLHDL   Lab Results  Component Value Date   HGBA1C 6.9 05/26/2013     Lab Results  Component Value Date   TSH 2.386 06/02/2013       Studies/Results: Dg Chest 2 View  06/03/2013   *RADIOLOGY REPORT*  Clinical Data: Shortness of breath.  CHEST - 2 VIEW  Comparison: 06/03/2003.  Findings: The pacer wires are stable.  The heart is enlarged but unchanged.  The lungs are clear.  No pleural effusion.  The bony thorax is intact.  IMPRESSION: Stable cardiac enlargement. No acute pulmonary findings.   Original Report Authenticated By: Rudie Meyer, M.D.    Medications: I have reviewed the patient's current medications. Marland Kitchen amiodarone  200 mg Oral BID  . calcitRIOL  0.25 mcg Oral BID  . digoxin  0.125 mg Oral Daily  . furosemide  80 mg Intravenous BID  . glyBURIDE  10 mg Oral Q breakfast  . glyBURIDE  5 mg Oral Q supper  . glyBURIDE  5 mg Oral Once  . HYDROcodone-acetaminophen  1 tablet Oral BID  . insulin aspart  0-20 Units Subcutaneous TID WC  . insulin glargine  30 Units Subcutaneous QHS  . iron polysaccharides  150 mg Oral BID  . levothyroxine  125 mcg Oral QAC breakfast  . linagliptin  5 mg Oral Daily  . metoprolol tartrate  37.5 mg Oral BID  . mupirocin ointment   Nasal BID  . nitroGLYCERIN  0.4 mg Transdermal QODAY  . potassium chloride SA  20 mEq Oral BID  . sodium  chloride  3 mL Intravenous Q12H  . sodium chloride  3 mL Intravenous Q12H  . Vitamin D (Ergocalciferol)  50,000 Units Oral 2 times weekly  . warfarin  2.5 mg Oral QODAY  . warfarin  5 mg Oral QODAY  . Warfarin - Pharmacist Dosing Inpatient   Does not apply q1800   Assessment/Plan: Principal Problem:   Acute on chronic diastolic HF (heart failure) Active Problems:   DIABETES MELLITUS   OBESITY, MORBID   OBSTRUCTIVE SLEEP APNEA   HYPERTENSION   PACEMAKER, PERMANENT   Solitary kidney, acquired   Cardiomyopathy, nonischemic   PAF (paroxysmal atrial fibrillation), 1st episode since DCCV in Jan/14   CKD (chronic kidney disease) stage 4, GFR 15-29 ml/min   Ulcers of both lower extremities, small, now with cellulitis  PLAN:  INR stable, glucose stable  no hypoglycemia.   Continues in atrial fib,  Rate controlled.  Unable to do DCCV today, plan for 06/08/13 either inpt or outpt.    Diuresing on Lasix 80 mg BID.  Continue to diuresis.  CKD stable.  LEGS now appear to have cellulitis will add doxycyline- this has helped before, she believes these ulcers are mostly due to insulation that fell on her at home, but it with diuresing and antibiotics they do not improve she should see Dermatologist as outpatient.    Pt does not need Home Health. Pt to ambulate with walker today. Will ask cardiac rehab to walk today.    LOS: 3 days   Time spent with pt. :20 minutes. [facility]  Nurse Practitioner Certified Pager [phone] 06/05/2013, 10:45 AM  I have seen and examined the patient along with Nada Boozer, NP.  I have reviewed the chart, notes and new data.  I agree with NP's note.  Key new complaints: emotional, mildly orthopneic, no acute distress Key examination changes: remains in AF, multiple erythematous ulcerated lesions on both legs, not typical for venous stasis ulcers. Key new findings / data: BNP improved, creat up slightly  PLAN: Continue diuretics. She is still hypervolemic and will delay cardioversion until she is closer to euvolemic status.  Thurmon Fair, MD, [facility] [phone] 06/05/2013, 12:33 PM

## 2013-06-06 LAB — CBC
HCT: 35 % — ABNORMAL LOW (ref 36.0–46.0)
Hemoglobin: 11 g/dL — ABNORMAL LOW (ref 12.0–15.0)
MCH: 28.5 pg (ref 26.0–34.0)
MCHC: 31.4 g/dL (ref 30.0–36.0)
RDW: 18.7 % — ABNORMAL HIGH (ref 11.5–15.5)

## 2013-06-06 LAB — BASIC METABOLIC PANEL
BUN: 66 mg/dL — ABNORMAL HIGH (ref 6–23)
Creatinine, Ser: 2.59 mg/dL — ABNORMAL HIGH (ref 0.50–1.10)
GFR calc Af Amer: 21 mL/min — ABNORMAL LOW (ref >90)
GFR calc non Af Amer: 18 mL/min — ABNORMAL LOW (ref >90)
Glucose, Bld: 130 mg/dL — ABNORMAL HIGH (ref 70–99)

## 2013-06-06 LAB — PRO B NATRIURETIC PEPTIDE: Pro B Natriuretic peptide (BNP): 4452 pg/mL — ABNORMAL HIGH (ref 0–125)

## 2013-06-06 LAB — GLUCOSE, CAPILLARY
Glucose-Capillary: 145 mg/dL — ABNORMAL HIGH (ref 70–99)
Glucose-Capillary: 146 mg/dL — ABNORMAL HIGH (ref 70–99)

## 2013-06-06 LAB — PROTIME-INR: Prothrombin Time: 27.5 seconds — ABNORMAL HIGH (ref 11.6–15.2)

## 2013-06-06 NOTE — Progress Notes (Signed)
ANTICOAGULATION CONSULT NOTE - Follow Up Consult  Pharmacy Consult for warfarin Indication: atrial fibrillation  Allergies  Allergen Reactions  . Ivp Dye (Iodinated Diagnostic Agents) Shortness Of Breath    Turn red, can't breathe  . Betadine (Povidone Iodine)   . Diphenhydramine Hcl Swelling    In hands and eyes  . Fish Allergy Nausea And Vomiting  . Iohexol      Desc: PT TURNS RED AND WHEEZING   . Penicillins Other (See Comments)    Resp arrest as child  . Povidone Other (See Comments)    Wheezing, turn red, can't breath, and blisters  . Promethazine Hcl Other (See Comments)    Low Blood Pressure  . Tape     Blisters, can use paper tape for short periods  . Clindamycin Hives and Rash    wheezing  . Iodine Rash  . Morphine Sulfate Nausea And Vomiting and Rash    Patient Measurements: Height: 5\' 7"  (170.2 cm) Weight: 350 lb 5 oz (158.9 kg) IBW/kg (Calculated) : 61.6  Vital Signs: Temp: 97.1 F (36.2 C) (06/07 0517) Temp src: Oral (06/07 0517) BP: 94/60 mmHg (06/07 0517) Pulse Rate: 71 (06/07 0517)  Labs:  Recent Labs  06/04/13 0455 06/05/13 0450 06/06/13 0445  HGB  --   --  11.0*  HCT  --   --  35.0*  PLT  --   --  149*  LABPROT 27.7* 27.4* 27.5*  INR 2.75* 2.71* 2.72*  CREATININE 2.29* 2.50* 2.59*    Estimated Creatinine Clearance: 34.8 ml/min (by C-G formula based on Cr of 2.59).   Medications:  Scheduled:  . amiodarone  200 mg Oral BID  . calcitRIOL  0.25 mcg Oral BID  . digoxin  0.125 mg Oral Daily  . doxycycline  100 mg Oral BID  . furosemide  80 mg Intravenous BID  . glyBURIDE  10 mg Oral Q breakfast  . glyBURIDE  5 mg Oral Q supper  . glyBURIDE  5 mg Oral Once  . HYDROcodone-acetaminophen  1 tablet Oral BID  . insulin aspart  0-20 Units Subcutaneous TID WC  . insulin glargine  30 Units Subcutaneous QHS  . iron polysaccharides  150 mg Oral BID  . levothyroxine  125 mcg Oral QAC breakfast  . linagliptin  5 mg Oral Daily  . metoprolol  tartrate  37.5 mg Oral BID  . mupirocin ointment   Nasal BID  . nitroGLYCERIN  0.4 mg Transdermal QODAY  . potassium chloride SA  20 mEq Oral BID  . sodium chloride  3 mL Intravenous Q12H  . sodium chloride  3 mL Intravenous Q12H  . Vitamin D (Ergocalciferol)  50,000 Units Oral 2 times weekly  . warfarin  2.5 mg Oral QODAY  . warfarin  5 mg Oral QODAY  . Warfarin - Pharmacist Dosing Inpatient   Does not apply q1800    Assessment: 63 yo female admitted with c/o SOB. She continues on warfarin for afib. Her INR remains therapeutic at 2.72 on her home regimen. CBC is slightly low, but appears relatively stable. No bleeding noted.   Goal of Therapy:  INR 2-3  Plan:  1. Continue home regimen of alternating 2.5mg  and 5mg  of warfarin  2. F/u AM INR  Lillia Pauls, PharmD Clinical Pharmacist Pager: [phone] Phone: [phone] 06/06/2013 9:19 AM

## 2013-06-06 NOTE — Progress Notes (Signed)
[facility]  Subjective: Feeling better. Breathing has improved. Out of bed and in a chair.   Objective: Vital signs in last 24 hours: Temp:  [96.9 F (36.1 C)-97.1 F (36.2 C)] 97.1 F (36.2 C) (06/07 0517) Pulse Rate:  [70-90] 90 (06/07 0940) Resp:  [20-22] 22 (06/07 0517) BP: (94-107)/(60-70) 102/70 mmHg (06/07 0940) SpO2:  [95 %-100 %] 100 % (06/07 0517) Weight:  [350 lb 5 oz (158.9 kg)] 350 lb 5 oz (158.9 kg) (06/07 0517) Last BM Date: 06/05/13  Intake/Output from previous day: 06/06 0701 - 06/07 0700 In: 760 [P.O.:760] Out: 3150 [Urine:3150] Intake/Output this shift: Total I/O In: 60 [P.O.:60] Out: -   Medications Current Facility-Administered Medications  Medication Dose Route Frequency Provider Last Rate Last Dose  . 0.9 %  sodium chloride infusion  250 mL Intravenous PRN Wilburt Finlay, PA-C      . 0.9 %  sodium chloride infusion  250 mL Intravenous Continuous Nada Boozer, NP      . acetaminophen (TYLENOL) tablet 650 mg  650 mg Oral Q4H PRN Wilburt Finlay, PA-C   650 mg at 06/06/13 0319  . albuterol (PROVENTIL) (5 MG/ML) 0.5% nebulizer solution 2.5 mg  2.5 mg Nebulization Q6H PRN Wilburt Finlay, PA-C      . ALPRAZolam Prudy Feeler) tablet 0.25 mg  0.25 mg Oral TID PRN Wilburt Finlay, PA-C      . amiodarone (PACERONE) tablet 200 mg  200 mg Oral BID Wilburt Finlay, PA-C   200 mg at 06/06/13 0935  . calcitRIOL (ROCALTROL) capsule 0.25 mcg  0.25 mcg Oral BID Wilburt Finlay, PA-C   0.25 mcg at 06/06/13 0935  . digoxin (LANOXIN) tablet 0.125 mg  0.125 mg Oral Daily Wilburt Finlay, PA-C   0.125 mg at 06/06/13 0935  . doxycycline (VIBRA-TABS) tablet 100 mg  100 mg Oral BID Nada Boozer, NP   100 mg at 06/06/13 0934  . DULoxetine (CYMBALTA) DR capsule 30 mg  30 mg Oral BID PRN Wilburt Finlay, PA-C      . furosemide (LASIX) injection 80 mg  80 mg Intravenous BID Wilburt Finlay, PA-C   80 mg at 06/06/13 0935  . glyBURIDE (DIABETA) tablet 10 mg  10 mg Oral Q breakfast Nada Boozer, NP   10 mg at 06/06/13 1610  . glyBURIDE (DIABETA) tablet 5 mg  5 mg Oral Q supper Nada Boozer, NP   5 mg at 06/05/13 1633  . glyBURIDE (DIABETA) tablet 5 mg  5 mg Oral Once Thurmon Fair, MD      . HYDROcodone-acetaminophen (NORCO) 10-325 MG per tablet 1 tablet  1 tablet Oral BID Wilburt Finlay, PA-C   1 tablet at 06/06/13 0934  . insulin aspart (novoLOG) injection 0-20 Units  0-20 Units Subcutaneous TID WC Wilburt Finlay, PA-C   4 Units at 06/05/13 1629  . insulin glargine (LANTUS) injection 30 Units  30 Units Subcutaneous QHS Wilburt Finlay, PA-C   30 Units at 06/05/13 2300  . iron polysaccharides (NIFEREX) capsule 150 mg  150 mg Oral BID Wilburt Finlay, PA-C   150 mg at 06/06/13 0935  . levothyroxine (SYNTHROID, LEVOTHROID) tablet 125 mcg  125 mcg Oral QAC breakfast Wilburt Finlay, PA-C   125 mcg at 06/06/13 [phone]  . linagliptin (TRADJENTA) tablet 5 mg  5 mg Oral Daily Wilburt Finlay, PA-C   5 mg at 06/06/13 0935  . metoprolol tartrate (LOPRESSOR) tablet 37.5 mg  37.5 mg Oral BID Wilburt Finlay, PA-C   37.5 mg at  06/06/13 0940  . mupirocin ointment (BACTROBAN) 2 %   Nasal BID Nada Boozer, NP      . nitroGLYCERIN (NITRODUR - Dosed in mg/24 hr) patch 0.4 mg  0.4 mg Transdermal QODAY Wilburt Finlay, PA-C      . nitroGLYCERIN (NITROSTAT) SL tablet 0.4 mg  0.4 mg Sublingual Q5 min PRN Wilburt Finlay, PA-C      . ondansetron [facility]) injection 4 mg  4 mg Intravenous Q6H PRN Wilburt Finlay, PA-C      . potassium chloride SA (K-DUR,KLOR-CON) CR tablet 20 mEq  20 mEq Oral BID Wilburt Finlay, PA-C   20 mEq at 06/06/13 0935  . sodium chloride 0.9 % injection 3 mL  3 mL Intravenous Q12H Wilburt Finlay, PA-C   3 mL at 06/05/13 2300  . sodium chloride 0.9 % injection 3 mL  3 mL Intravenous PRN Wilburt Finlay, PA-C      . sodium chloride 0.9 % injection 3 mL  3 mL Intravenous Q12H Nada Boozer, NP   3 mL at 06/06/13 0936  . sodium chloride 0.9 % injection 3 mL  3 mL Intravenous PRN Nada Boozer, NP      . Vitamin D (Ergocalciferol)  (DRISDOL) capsule 50,000 Units  50,000 Units Oral 2 times weekly Wilburt Finlay, PA-C      . warfarin (COUMADIN) tablet 2.5 mg  2.5 mg Oral QODAY Chinita Greenland, RPH   2.5 mg at 06/05/13 1810  . warfarin (COUMADIN) tablet 5 mg  5 mg Oral QODAY Drake Leach Rumbarger, RPH   5 mg at 06/04/13 1657  . Warfarin - Pharmacist Dosing Inpatient   Does not apply q1800 Chinita Greenland, [facility]      . zolpidem (AMBIEN) tablet 10 mg  10 mg Oral QHS PRN Wilburt Finlay, PA-C   10 mg at 06/02/13 2313    PE: General appearance: alert, cooperative, no distress and morbidly obese Lungs: clear to auscultation bilaterally Heart: regular rate and rhythm Extremities: + bilateral LEE, + bilateral erythematous skin ulcers Pulses: 2+ and symmetric Skin: warm and dry Neurologic: Grossly normal  Lab Results:   Recent Labs  06/06/13 0445  WBC 5.3  HGB 11.0*  HCT 35.0*  PLT 149*   BMET  Recent Labs  06/04/13 0455 06/05/13 0450 06/06/13 0445  NA 136 136 138  K 3.8 3.8 3.7  CL 92* 91* 93*  CO2 32 32 35*  GLUCOSE 137* 154* 130*  BUN 56* 63* 66*  CREATININE 2.29* 2.50* 2.59*  CALCIUM 9.3 9.4 9.4   PT/INR  Recent Labs  06/04/13 0455 06/05/13 0450 06/06/13 0445  LABPROT 27.7* 27.4* 27.5*  INR 2.75* 2.71* 2.72*   BNP (last 3 results)  Recent Labs  11/06/12 0535 06/02/13 1936 06/04/13 0455  PROBNP 3876.0* 5132.0* 3645.0*   Assessment/Plan    Principal Problem:   Acute on chronic diastolic HF (heart failure) Active Problems:   DIABETES MELLITUS   OBESITY, MORBID   OBSTRUCTIVE SLEEP APNEA   HYPERTENSION   PACEMAKER, PERMANENT   Solitary kidney, acquired   Cardiomyopathy, nonischemic   PAF (paroxysmal atrial fibrillation), 1st episode since DCCV in Jan/14   CKD (chronic kidney disease) stage 4, GFR 15-29 ml/min   Ulcers of both lower extremities, small, now with cellulitis  Plan: Pt is hemodynamically stable. Telemetry shows paced rhythm w/ a HR of 70 bpm. ?underlying A-fib. INR is  therapeutic at 2.72. Pt reports improvement in breathing. She is on 80 mg IV Lasix for diuresis for CHF. Will  recheck BNP. Pt reports subjective improvement in bilateal lower extremity skin wounds. Continue with Bactroban.  ?DCCV on Monday. Dr. Royann Shivers to follow with full recommendations.     LOS: 4 days    Brittainy M. Fruge, PA-C 06/06/2013 11:52 AM  I have seen and examined the patient along with Brittainy M. Sharol Harness, PA-C.  I have reviewed the chart, notes and new data.  I agree with PA's note.  Key new complaints: Subjectively improved Key examination changes: Still has massive lower showed edema, this is a chronic problem related to venous insufficiency and will not completely resolved even when she is fully diuresed Key new findings / data: There is substantial improvement in throat BNP levels, down from a peak of 5132 down to 3645, similar to discharge proBNP on 11/06/2012; her renal function parameters are roughly the same as yesterday Conversely her weight has shown very little change and I wonder about the accuracy of her weights.  PLAN: We'll plan to re analyze her rhythm via her pacemaker prior to planned cardioversion Monday. Continue diuretics  Thurmon Fair, MD, [facility] [phone] 06/06/2013, 12:34 PM

## 2013-06-07 DIAGNOSIS — I059 Rheumatic mitral valve disease, unspecified: Secondary | ICD-10-CM

## 2013-06-07 LAB — CBC
Hemoglobin: 12.1 g/dL (ref 12.0–15.0)
MCH: 28.9 pg (ref 26.0–34.0)
MCHC: 31.7 g/dL (ref 30.0–36.0)
MCV: 91.2 fL (ref 78.0–100.0)
RBC: 4.19 MIL/uL (ref 3.87–5.11)

## 2013-06-07 LAB — GLUCOSE, CAPILLARY: Glucose-Capillary: 144 mg/dL — ABNORMAL HIGH (ref 70–99)

## 2013-06-07 LAB — BASIC METABOLIC PANEL
BUN: 65 mg/dL — ABNORMAL HIGH (ref 6–23)
CO2: 34 mEq/L — ABNORMAL HIGH (ref 19–32)
Calcium: 9.8 mg/dL (ref 8.4–10.5)
GFR calc non Af Amer: 20 mL/min — ABNORMAL LOW (ref >90)
Glucose, Bld: 115 mg/dL — ABNORMAL HIGH (ref 70–99)
Potassium: 3.6 mEq/L (ref 3.5–5.1)
Sodium: 135 mEq/L (ref 135–145)

## 2013-06-07 MED ORDER — SODIUM CHLORIDE 0.9 % IJ SOLN
3.0000 mL | Freq: Two times a day (BID) | INTRAMUSCULAR | Status: DC
  Administered 2013-06-07 – 2013-06-10 (×7): 3 mL via INTRAVENOUS

## 2013-06-07 MED ORDER — SODIUM CHLORIDE 0.9 % IV SOLN: 250.0000 mL | INTRAVENOUS | Status: DC

## 2013-06-07 MED ORDER — METOLAZONE 5 MG PO TABS
5.0000 mg | ORAL_TABLET | Freq: Once | ORAL | Status: AC
  Administered 2013-06-07: 5 mg via ORAL
  Filled 2013-06-07: qty 1

## 2013-06-07 MED ORDER — HYDROCORTISONE 1 % EX CREA
1.0000 "application " | TOPICAL_CREAM | Freq: Three times a day (TID) | CUTANEOUS | Status: DC | PRN
  Filled 2013-06-07: qty 28

## 2013-06-07 MED ORDER — SODIUM CHLORIDE 0.9 % IJ SOLN: 3.0000 mL | INTRAMUSCULAR | Status: DC | PRN

## 2013-06-07 MED ORDER — METOLAZONE 5 MG PO TABS
5.0000 mg | ORAL_TABLET | Freq: Once | ORAL | Status: DC
  Filled 2013-06-07 (×2): qty 1

## 2013-06-07 NOTE — Progress Notes (Signed)
[facility]  DAILY PROGRESS NOTE   Subjective:  Feels tired. No orthopnea. Dyspnea walking about 50 feet. Edema better. Diuresis has "stalled". Weight not much changed. BNP slightly higher. Creatinine stabilized and K ois normal.  Objective:  Temp:  [96.7 F (35.9 C)-98.2 F (36.8 C)] 97.6 F (36.4 C) (06/08 0859) Pulse Rate:  [72-80] 72 (06/08 0859) Resp:  [18-22] 18 (06/08 0859) BP: (99-106)/(58-68) 106/58 mmHg (06/08 0859) SpO2:  [94 %-99 %] 99 % (06/08 0859) Weight:  [158.85 kg (350 lb 3.2 oz)] 158.85 kg (350 lb 3.2 oz) (06/08 0603) Weight change: -0.05 kg (-1.8 oz)  Intake/Output from previous day: 06/07 0701 - 06/08 0700 In: 830 [P.O.:830] Out: 2150 [Urine:2150]  Intake/Output from this shift:    Medications: Current Facility-Administered Medications  Medication Dose Route Frequency Provider Last Rate Last Dose  . 0.9 %  sodium chloride infusion  250 mL Intravenous PRN Wilburt Finlay, PA-C      . 0.9 %  sodium chloride infusion  250 mL Intravenous Continuous Nada Boozer, NP      . acetaminophen (TYLENOL) tablet 650 mg  650 mg Oral Q4H PRN Wilburt Finlay, PA-C   650 mg at 06/06/13 0319  . albuterol (PROVENTIL) (5 MG/ML) 0.5% nebulizer solution 2.5 mg  2.5 mg Nebulization Q6H PRN Wilburt Finlay, PA-C      . ALPRAZolam Prudy Feeler) tablet 0.25 mg  0.25 mg Oral TID PRN Wilburt Finlay, PA-C      . amiodarone (PACERONE) tablet 200 mg  200 mg Oral BID Wilburt Finlay, PA-C   200 mg at 06/07/13 1040  . calcitRIOL (ROCALTROL) capsule 0.25 mcg  0.25 mcg Oral BID Wilburt Finlay, PA-C   0.25 mcg at 06/07/13 1040  . digoxin (LANOXIN) tablet 0.125 mg  0.125 mg Oral Daily Wilburt Finlay, PA-C   0.125 mg at 06/07/13 1039  . doxycycline (VIBRA-TABS) tablet 100 mg  100 mg Oral BID Nada Boozer, NP   100 mg at 06/07/13 1037  . DULoxetine (CYMBALTA) DR capsule 30 mg  30 mg Oral BID PRN Wilburt Finlay, PA-C      . furosemide (LASIX) injection 80 mg  80 mg Intravenous BID Wilburt Finlay, PA-C    80 mg at 06/07/13 0842  . glyBURIDE (DIABETA) tablet 10 mg  10 mg Oral Q breakfast Nada Boozer, NP   10 mg at 06/07/13 0704  . glyBURIDE (DIABETA) tablet 5 mg  5 mg Oral Q supper Nada Boozer, NP   5 mg at 06/06/13 1704  . glyBURIDE (DIABETA) tablet 5 mg  5 mg Oral Once Thurmon Fair, MD      . HYDROcodone-acetaminophen (NORCO) 10-325 MG per tablet 1 tablet  1 tablet Oral BID Wilburt Finlay, PA-C   1 tablet at 06/07/13 1039  . insulin aspart (novoLOG) injection 0-20 Units  0-20 Units Subcutaneous TID WC Wilburt Finlay, PA-C   3 Units at 06/06/13 1706  . insulin glargine (LANTUS) injection 30 Units  30 Units Subcutaneous QHS Wilburt Finlay, PA-C   30 Units at 06/06/13 2258  . iron polysaccharides (NIFEREX) capsule 150 mg  150 mg Oral BID Wilburt Finlay, PA-C   150 mg at 06/07/13 1040  . levothyroxine (SYNTHROID, LEVOTHROID) tablet 125 mcg  125 mcg Oral QAC breakfast Wilburt Finlay, PA-C   125 mcg at 06/07/13 9562  . linagliptin (TRADJENTA) tablet 5 mg  5 mg Oral Daily Wilburt Finlay, PA-C   5 mg at 06/07/13 1039  . metolazone (ZAROXOLYN) tablet 5 mg  5 mg Oral Once Lamar Naef, MD      . metoprolol tartrate (LOPRESSOR) tablet 37.5 mg  37.5 mg Oral BID Wilburt Finlay, PA-C   37.5 mg at 06/07/13 1040  . mupirocin ointment (BACTROBAN) 2 %   Nasal BID Nada Boozer, NP      . nitroGLYCERIN (NITRODUR - Dosed in mg/24 hr) patch 0.4 mg  0.4 mg Transdermal QODAY Wilburt Finlay, PA-C      . nitroGLYCERIN (NITROSTAT) SL tablet 0.4 mg  0.4 mg Sublingual Q5 min PRN Wilburt Finlay, PA-C      . ondansetron [facility]) injection 4 mg  4 mg Intravenous Q6H PRN Wilburt Finlay, PA-C      . potassium chloride SA (K-DUR,KLOR-CON) CR tablet 20 mEq  20 mEq Oral BID Wilburt Finlay, PA-C   20 mEq at 06/07/13 1039  . sodium chloride 0.9 % injection 3 mL  3 mL Intravenous Q12H Wilburt Finlay, PA-C   3 mL at 06/06/13 2300  . sodium chloride 0.9 % injection 3 mL  3 mL Intravenous PRN Wilburt Finlay, PA-C   3 mL at 06/07/13 1046  . sodium chloride 0.9 % injection  3 mL  3 mL Intravenous Q12H Nada Boozer, NP   3 mL at 06/07/13 1044  . sodium chloride 0.9 % injection 3 mL  3 mL Intravenous PRN Nada Boozer, NP      . Vitamin D (Ergocalciferol) (DRISDOL) capsule 50,000 Units  50,000 Units Oral 2 times weekly Wilburt Finlay, PA-C      . warfarin (COUMADIN) tablet 2.5 mg  2.5 mg Oral QODAY Chinita Greenland, RPH   2.5 mg at 06/05/13 1810  . warfarin (COUMADIN) tablet 5 mg  5 mg Oral QODAY Drake Leach Rumbarger, RPH   5 mg at 06/06/13 1704  . Warfarin - Pharmacist Dosing Inpatient   Does not apply q1800 Chinita Greenland, [facility]      . zolpidem (AMBIEN) tablet 10 mg  10 mg Oral QHS PRN Wilburt Finlay, PA-C   10 mg at 06/02/13 2313    Physical Exam: General appearance: alert, cooperative and no distress Neck: no adenopathy, no carotid bruit, supple, symmetrical, trachea midline, thyroid not enlarged, symmetric, no tenderness/mass/nodules and cannot see JVP well Lungs: clear to auscultation bilaterally Heart: regular rate and rhythm, S1: normal, S2: paradoxically splitting and 2/6 apical holosystolic murmur Abdomen: soft, non-tender; bowel sounds normal; no masses,  no organomegaly Extremities: edema 2-3 + calf edema bilaterally Pulses: 2+ and symmetric Skin: Skin color, texture, turgor normal, ulcerations on legs unchanged Neurologic: grossly normal  Lab Results: Results for orders placed during the hospital encounter of 06/02/13 (from the past 48 hour(s))  GLUCOSE, CAPILLARY     Status: Abnormal   Collection Time    06/05/13  4:02 PM      Result Value Range   Glucose-Capillary 153 (*) 70 - 99 mg/dL   Comment 1 Notify RN    GLUCOSE, CAPILLARY     Status: Abnormal   Collection Time    06/05/13  9:06 PM      Result Value Range   Glucose-Capillary 132 (*) 70 - 99 mg/dL   Comment 1 Documented in Chart     Comment 2 Notify RN    PROTIME-INR     Status: Abnormal   Collection Time    06/06/13  4:45 AM      Result Value Range   Prothrombin Time 27.5 (*) 11.6  - 15.2 seconds   INR 2.72 (*) 0.00 -  1.49  BASIC METABOLIC PANEL     Status: Abnormal   Collection Time    06/06/13  4:45 AM      Result Value Range   Sodium 138  135 - 145 mEq/L   Potassium 3.7  3.5 - 5.1 mEq/L   Chloride 93 (*) 96 - 112 mEq/L   CO2 35 (*) 19 - 32 mEq/L   Glucose, Bld 130 (*) 70 - 99 mg/dL   BUN 66 (*) 6 - 23 mg/dL   Creatinine, Ser 1.61 (*) 0.50 - 1.10 mg/dL   Calcium 9.4  8.4 - 09.6 mg/dL   GFR calc non Af Amer 18 (*) >90 mL/min   GFR calc Af Amer 21 (*) >90 mL/min   Comment:            The eGFR has been calculated     using the CKD EPI equation.     This calculation has not been     validated in all clinical     situations.     eGFR's persistently     <90 mL/min signify     possible Chronic Kidney Disease.  CBC     Status: Abnormal   Collection Time    06/06/13  4:45 AM      Result Value Range   WBC 5.3  4.0 - 10.5 K/uL   RBC 3.86 (*) 3.87 - 5.11 MIL/uL   Hemoglobin 11.0 (*) 12.0 - 15.0 g/dL   HCT 04.5 (*) 40.9 - 81.1 %   MCV 90.7  78.0 - 100.0 fL   MCH 28.5  26.0 - 34.0 pg   MCHC 31.4  30.0 - 36.0 g/dL   RDW 91.4 (*) 78.2 - 95.6 %   Platelets 149 (*) 150 - 400 K/uL  GLUCOSE, CAPILLARY     Status: Abnormal   Collection Time    06/06/13  6:36 AM      Result Value Range   Glucose-Capillary 115 (*) 70 - 99 mg/dL  GLUCOSE, CAPILLARY     Status: Abnormal   Collection Time    06/06/13 11:07 AM      Result Value Range   Glucose-Capillary 139 (*) 70 - 99 mg/dL   Comment 1 Documented in Chart     Comment 2 Notify RN    PRO B NATRIURETIC PEPTIDE     Status: Abnormal   Collection Time    06/06/13 12:35 PM      Result Value Range   Pro B Natriuretic peptide (BNP) 4452.0 (*) 0 - 125 pg/mL  GLUCOSE, CAPILLARY     Status: Abnormal   Collection Time    06/06/13  3:53 PM      Result Value Range   Glucose-Capillary 145 (*) 70 - 99 mg/dL   Comment 1 Documented in Chart     Comment 2 Notify RN    GLUCOSE, CAPILLARY     Status: Abnormal   Collection Time     06/06/13  8:30 PM      Result Value Range   Glucose-Capillary 146 (*) 70 - 99 mg/dL   Comment 1 Documented in Chart     Comment 2 Notify RN    PROTIME-INR     Status: Abnormal   Collection Time    06/07/13  4:30 AM      Result Value Range   Prothrombin Time 26.5 (*) 11.6 - 15.2 seconds   INR 2.59 (*) 0.00 - 1.49  BASIC METABOLIC PANEL     Status: Abnormal  Collection Time    06/07/13  4:30 AM      Result Value Range   Sodium 135  135 - 145 mEq/L   Potassium 3.6  3.5 - 5.1 mEq/L   Chloride 92 (*) 96 - 112 mEq/L   CO2 34 (*) 19 - 32 mEq/L   Glucose, Bld 115 (*) 70 - 99 mg/dL   BUN 65 (*) 6 - 23 mg/dL   Creatinine, Ser 0.10 (*) 0.50 - 1.10 mg/dL   Calcium 9.8  8.4 - 27.2 mg/dL   GFR calc non Af Amer 20 (*) >90 mL/min   GFR calc Af Amer 23 (*) >90 mL/min   Comment:            The eGFR has been calculated     using the CKD EPI equation.     This calculation has not been     validated in all clinical     situations.     eGFR's persistently     <90 mL/min signify     possible Chronic Kidney Disease.  CBC     Status: Abnormal   Collection Time    06/07/13  4:30 AM      Result Value Range   WBC 5.9  4.0 - 10.5 K/uL   RBC 4.19  3.87 - 5.11 MIL/uL   Hemoglobin 12.1  12.0 - 15.0 g/dL   HCT 53.6  64.4 - 03.4 %   MCV 91.2  78.0 - 100.0 fL   MCH 28.9  26.0 - 34.0 pg   MCHC 31.7  30.0 - 36.0 g/dL   RDW 74.2 (*) 59.5 - 63.8 %   Platelets 176  150 - 400 K/uL    Imaging: No results found.  Assessment:  1. Principal Problem: 2.   Acute on chronic diastolic HF (heart failure) 3. Active Problems: 4.   DIABETES MELLITUS 5.   OBESITY, MORBID 6.   OBSTRUCTIVE SLEEP APNEA 7.   HYPERTENSION 8.   PACEMAKER, PERMANENT 9.   Solitary kidney, acquired 10.   Cardiomyopathy, nonischemic 11.   PAF (paroxysmal atrial fibrillation), 1st episode since DCCV in Jan/14 12.   CKD (chronic kidney disease) stage 4, GFR 15-29 ml/min 13.   Ulcers of both lower extremities, small, now with  cellulitis 14.   Plan:  1. Still grossly hypervolemic and has dyspnea. 2. Continue IV diuretics and add metolazone today. 3. Cardioversion at bedside tomorrow afternoon.  Time Spent Directly with Patient:  35 minutes  Length of Stay:  LOS: 5 days    Melizza Kanode 06/07/2013, 11:41 AM

## 2013-06-07 NOTE — Progress Notes (Signed)
Patient c/o pain related to fibromyalgia.  PRN Xanax given x1 and scheduled Norco x1.  Patient resting comfortably at bedside in no acute distress. RN will continue to monitor. Louretta Parma, RN

## 2013-06-07 NOTE — Progress Notes (Signed)
ANTICOAGULATION CONSULT NOTE - Follow Up Consult  Pharmacy Consult for warfarin Indication: atrial fibrillation  Allergies  Allergen Reactions  . Ivp Dye (Iodinated Diagnostic Agents) Shortness Of Breath    Turn red, can't breathe  . Betadine (Povidone Iodine)   . Diphenhydramine Hcl Swelling    In hands and eyes  . Fish Allergy Nausea And Vomiting  . Iohexol      Desc: PT TURNS RED AND WHEEZING   . Penicillins Other (See Comments)    Resp arrest as child  . Povidone Other (See Comments)    Wheezing, turn red, can't breath, and blisters  . Promethazine Hcl Other (See Comments)    Low Blood Pressure  . Tape     Blisters, can use paper tape for short periods  . Clindamycin Hives and Rash    wheezing  . Iodine Rash  . Morphine Sulfate Nausea And Vomiting and Rash    Patient Measurements: Height: 5\' 7"  (170.2 cm) Weight: 350 lb 3.2 oz (158.85 kg) (SCALE A) IBW/kg (Calculated) : 61.6  Vital Signs: Temp: 98.2 F (36.8 C) (06/08 0603) Temp src: Oral (06/08 0603) BP: 100/66 mmHg (06/08 0603) Pulse Rate: 80 (06/08 0603)  Labs:  Recent Labs  06/05/13 0450 06/06/13 0445 06/07/13 0430  HGB  --  11.0* 12.1  HCT  --  35.0* 38.2  PLT  --  149* 176  LABPROT 27.4* 27.5* 26.5*  INR 2.71* 2.72* 2.59*  CREATININE 2.50* 2.59* 2.45*    Estimated Creatinine Clearance: 36.8 ml/min (by C-G formula based on Cr of 2.45).   Medications:  Scheduled:  . amiodarone  200 mg Oral BID  . calcitRIOL  0.25 mcg Oral BID  . digoxin  0.125 mg Oral Daily  . doxycycline  100 mg Oral BID  . furosemide  80 mg Intravenous BID  . glyBURIDE  10 mg Oral Q breakfast  . glyBURIDE  5 mg Oral Q supper  . glyBURIDE  5 mg Oral Once  . HYDROcodone-acetaminophen  1 tablet Oral BID  . insulin aspart  0-20 Units Subcutaneous TID WC  . insulin glargine  30 Units Subcutaneous QHS  . iron polysaccharides  150 mg Oral BID  . levothyroxine  125 mcg Oral QAC breakfast  . linagliptin  5 mg Oral Daily  .  metoprolol tartrate  37.5 mg Oral BID  . mupirocin ointment   Nasal BID  . nitroGLYCERIN  0.4 mg Transdermal QODAY  . potassium chloride SA  20 mEq Oral BID  . sodium chloride  3 mL Intravenous Q12H  . sodium chloride  3 mL Intravenous Q12H  . Vitamin D (Ergocalciferol)  50,000 Units Oral 2 times weekly  . warfarin  2.5 mg Oral QODAY  . warfarin  5 mg Oral QODAY  . Warfarin - Pharmacist Dosing Inpatient   Does not apply q1800    Assessment: 63 yo female admitted with c/o SOB. She continues on warfarin for afib. Her INR remains therapeutic at 2.59 on her home regimen. CBC is wnl. No bleeding noted.   Goal of Therapy:  INR 2-3   Plan:  1. Continue home regimen of alternating 2.5mg  and 5mg  of warfarin  2. F/u AM INR  Lillia Pauls, PharmD Clinical Pharmacist Pager: [phone] Phone: [phone] 06/07/2013 8:06 AM

## 2013-06-08 ENCOUNTER — Encounter (HOSPITAL_COMMUNITY): Payer: Self-pay | Admitting: Certified Registered Nurse Anesthetist

## 2013-06-08 ENCOUNTER — Inpatient Hospital Stay (HOSPITAL_COMMUNITY): Payer: Medicare Other | Admitting: Certified Registered Nurse Anesthetist

## 2013-06-08 ENCOUNTER — Encounter (HOSPITAL_COMMUNITY)
Admission: AD | Disposition: A | Discharge status: Home / Self Care | Payer: Self-pay | Source: Ambulatory Visit | Attending: Cardiovascular Disease

## 2013-06-08 HISTORY — PX: CARDIOVERSION: SHX1299

## 2013-06-08 LAB — BASIC METABOLIC PANEL
BUN: 67 mg/dL — ABNORMAL HIGH (ref 6–23)
GFR calc Af Amer: 23 mL/min — ABNORMAL LOW (ref >90)
GFR calc non Af Amer: 20 mL/min — ABNORMAL LOW (ref >90)
Potassium: 3.4 mEq/L — ABNORMAL LOW (ref 3.5–5.1)

## 2013-06-08 LAB — GLUCOSE, CAPILLARY
Glucose-Capillary: 92 mg/dL (ref 70–99)
Glucose-Capillary: 117 mg/dL — ABNORMAL HIGH (ref 70–99)

## 2013-06-08 LAB — PROTIME-INR
INR: 2.77 — ABNORMAL HIGH (ref 0.00–1.49)
Prothrombin Time: 27.9 seconds — ABNORMAL HIGH (ref 11.6–15.2)

## 2013-06-08 SURGERY — CARDIOVERSION
Anesthesia: General | Wound class: Clean

## 2013-06-08 MED ORDER — AMIODARONE HCL 200 MG PO TABS
300.0000 mg | ORAL_TABLET | Freq: Two times a day (BID) | ORAL | Status: DC
  Administered 2013-06-08 – 2013-06-11 (×6): 300 mg via ORAL
  Filled 2013-06-08 (×7): qty 1

## 2013-06-08 MED ORDER — ERYTHROMYCIN 5 MG/GM OP OINT
TOPICAL_OINTMENT | Freq: Three times a day (TID) | OPHTHALMIC | Status: DC
  Administered 2013-06-08 – 2013-06-09 (×4): via OPHTHALMIC
  Filled 2013-06-08: qty 3.5

## 2013-06-08 MED ORDER — SODIUM CHLORIDE 0.9 % IV SOLN
INTRAVENOUS | Status: DC | PRN
  Administered 2013-06-08: 14:00:00 via INTRAVENOUS

## 2013-06-08 MED ORDER — LIDOCAINE HCL (CARDIAC) 20 MG/ML IV SOLN
INTRAVENOUS | Status: DC | PRN
  Administered 2013-06-08: 20 mg via INTRAVENOUS

## 2013-06-08 MED ORDER — PROPOFOL 10 MG/ML IV BOLUS
INTRAVENOUS | Status: DC | PRN
  Administered 2013-06-08: 50 mg via INTRAVENOUS

## 2013-06-08 NOTE — Anesthesia Preprocedure Evaluation (Addendum)
Anesthesia Evaluation  Patient identified by MRN, date of birth, ID band Patient awake    Reviewed: Allergy & Precautions, H&P , NPO status , Patient's Chart, lab work & pertinent test results, reviewed documented beta blocker date and time   History of Anesthesia Complications Negative for: history of anesthetic complications  Airway Mallampati: II TM Distance: >3 FB Neck ROM: Full    Dental  (+) Teeth Intact, Dental Advisory Given and Chipped,    Pulmonary shortness of breath and with exertion, sleep apnea and Continuous Positive Airway Pressure Ventilation ,  breath sounds clear to auscultation        Cardiovascular hypertension, Pt. on medications and Pt. on home beta blockers + DOE + dysrhythmias (INR 2.77) Atrial Fibrillation + pacemaker Rhythm:Irregular Rate:Normal  EF 30%, mod MR   Neuro/Psych    GI/Hepatic negative GI ROS, Neg liver ROS,   Endo/Other  diabetes (glu 92)Hypothyroidism Morbid obesity  Renal/GU CRFRenal disease     Musculoskeletal  (+) Fibromyalgia -  Abdominal (+) + obese,   Peds  Hematology   Anesthesia Other Findings   Reproductive/Obstetrics                          Anesthesia Physical Anesthesia Plan  ASA: III  Anesthesia Plan: General   Post-op Pain Management:    Induction: Intravenous  Airway Management Planned: Mask and Natural Airway  Additional Equipment:   Intra-op Plan:   Post-operative Plan:   Informed Consent: I have reviewed the patients History and Physical, chart, labs and discussed the procedure including the risks, benefits and alternatives for the proposed anesthesia with the patient or authorized representative who has indicated his/her understanding and acceptance.   Dental advisory given  Plan Discussed with: Anesthesiologist, CRNA and Surgeon  Anesthesia Plan Comments: (Plan routine monitors, GA for cardioversion  )        Anesthesia Quick Evaluation

## 2013-06-08 NOTE — Transfer of Care (Signed)
Immediate Anesthesia Transfer of Care Note  Patient: [name]  Procedure(s) Performed: Procedure(s) with comments: CARDIOVERSION (N/A) - Room [phone]  Patient Location: PACU and Nursing Unit  Anesthesia Type:General  Level of Consciousness: awake, oriented, patient cooperative and responds to stimulation  Airway & Oxygen Therapy: Patient Spontanous Breathing and Patient connected to nasal cannula oxygen  Post-op Assessment: Report given to PACU RN and Post -op Vital signs reviewed and stable  Post vital signs: Reviewed and stable  Complications: No apparent anesthesia complications

## 2013-06-08 NOTE — Anesthesia Postprocedure Evaluation (Signed)
  Anesthesia Post-op Note  Patient: [name]  Procedure(s) Performed: Procedure(s) with comments: CARDIOVERSION (N/A) - Room [phone]  Patient Location: PACU  Anesthesia Type:General  Level of Consciousness: awake, alert , oriented and patient cooperative  Airway and Oxygen Therapy: Patient Spontanous Breathing and Patient connected to nasal cannula oxygen  Post-op Pain: none  Post-op Assessment: Post-op Vital signs reviewed, Patient's Cardiovascular Status Stable, Respiratory Function Stable, Patent Airway, No signs of Nausea or vomiting and Pain level controlled  Post-op Vital Signs: Reviewed and stable  Complications: No apparent anesthesia complications

## 2013-06-08 NOTE — Progress Notes (Signed)
ANTICOAGULATION CONSULT NOTE - Follow Up Consult  Pharmacy Consult for warfarin Indication: atrial fibrillation  Allergies  Allergen Reactions  . Ivp Dye (Iodinated Diagnostic Agents) Shortness Of Breath    Turn red, can't breathe  . Betadine (Povidone Iodine)   . Diphenhydramine Hcl Swelling    In hands and eyes  . Fish Allergy Nausea And Vomiting  . Iohexol      Desc: PT TURNS RED AND WHEEZING   . Penicillins Other (See Comments)    Resp arrest as child  . Povidone Other (See Comments)    Wheezing, turn red, can't breath, and blisters  . Promethazine Hcl Other (See Comments)    Low Blood Pressure  . Tape     Blisters, can use paper tape for short periods  . Clindamycin Hives and Rash    wheezing  . Iodine Rash  . Morphine Sulfate Nausea And Vomiting and Rash    Patient Measurements: Height: 5\' 7"  (170.2 cm) Weight: 350 lb 12 oz (159.1 kg) (A scale) IBW/kg (Calculated) : 61.6  Vital Signs: Temp: 98.1 F (36.7 C) (06/09 0517) Temp src: Oral (06/09 0517) BP: 94/64 mmHg (06/09 0517) Pulse Rate: 71 (06/09 0517)  Labs:  Recent Labs  06/06/13 0445 06/07/13 0430 06/08/13 0440  HGB 11.0* 12.1  --   HCT 35.0* 38.2  --   PLT 149* 176  --   LABPROT 27.5* 26.5* 27.9*  INR 2.72* 2.59* 2.77*  CREATININE 2.59* 2.45* 2.40*    Estimated Creatinine Clearance: 37.6 ml/min (by C-G formula based on Cr of 2.4).   Medications:  Scheduled:  . amiodarone  200 mg Oral BID  . calcitRIOL  0.25 mcg Oral BID  . digoxin  0.125 mg Oral Daily  . doxycycline  100 mg Oral BID  . furosemide  80 mg Intravenous BID  . glyBURIDE  10 mg Oral Q breakfast  . glyBURIDE  5 mg Oral Q supper  . HYDROcodone-acetaminophen  1 tablet Oral BID  . insulin aspart  0-20 Units Subcutaneous TID WC  . insulin glargine  30 Units Subcutaneous QHS  . iron polysaccharides  150 mg Oral BID  . levothyroxine  125 mcg Oral QAC breakfast  . linagliptin  5 mg Oral Daily  . metoprolol tartrate  37.5 mg Oral  BID  . mupirocin ointment   Nasal BID  . nitroGLYCERIN  0.4 mg Transdermal QODAY  . potassium chloride SA  20 mEq Oral BID  . sodium chloride  3 mL Intravenous Q12H  . sodium chloride  3 mL Intravenous Q12H  . sodium chloride  3 mL Intravenous Q12H  . Vitamin D (Ergocalciferol)  50,000 Units Oral 2 times weekly  . warfarin  2.5 mg Oral QODAY  . warfarin  5 mg Oral QODAY  . Warfarin - Pharmacist Dosing Inpatient   Does not apply q1800    Assessment: 63 y/o female patient admitted with c/o SOB. She continues on warfarin for afib. Her INR remains therapeutic on her home regimen.  Goal of Therapy:  INR 2-3   Plan:  1. Continue home regimen of alternating 2.5mg  and 5mg  of warfarin  2. F/u AM INR  Verlene Mayer, PharmD, [place] Pager [phone]  06/08/2013 10:54 AM

## 2013-06-08 NOTE — Progress Notes (Signed)
[facility]  Subjective: No complaints.   Objective: Vital signs in last 24 hours: Temp:  [96.8 F (36 C)-98.1 F (36.7 C)] 96.8 F (36 C) (06/09 1122) Pulse Rate:  [71-73] 71 (06/09 1122) Resp:  [18-20] 20 (06/09 1122) BP: (94-106)/(62-72) 102/65 mmHg (06/09 1122) SpO2:  [95 %-98 %] 98 % (06/09 1122) Weight:  [350 lb 12 oz (159.1 kg)] 350 lb 12 oz (159.1 kg) (06/09 0453) Last BM Date: 06/07/13  Intake/Output from previous day: 06/08 0701 - 06/09 0700 In: 380 [P.O.:380] Out: 2100 [Urine:2100] Intake/Output this shift:    Medications Current Facility-Administered Medications  Medication Dose Route Frequency Provider Last Rate Last Dose  . 0.9 %  sodium chloride infusion  250 mL Intravenous PRN Wilburt Finlay, PA-C      . 0.9 %  sodium chloride infusion  250 mL Intravenous Continuous Nada Boozer, NP      . 0.9 %  sodium chloride infusion  250 mL Intravenous Continuous Brittainy Tout, PA-C      . acetaminophen (TYLENOL) tablet 650 mg  650 mg Oral Q4H PRN Wilburt Finlay, PA-C   650 mg at 06/08/13 9604  . albuterol (PROVENTIL) (5 MG/ML) 0.5% nebulizer solution 2.5 mg  2.5 mg Nebulization Q6H PRN Wilburt Finlay, PA-C      . ALPRAZolam Prudy Feeler) tablet 0.25 mg  0.25 mg Oral TID PRN Wilburt Finlay, PA-C   0.25 mg at 06/07/13 2152  . amiodarone (PACERONE) tablet 200 mg  200 mg Oral BID Wilburt Finlay, PA-C   200 mg at 06/08/13 1125  . calcitRIOL (ROCALTROL) capsule 0.25 mcg  0.25 mcg Oral BID Wilburt Finlay, PA-C   0.25 mcg at 06/08/13 1124  . digoxin (LANOXIN) tablet 0.125 mg  0.125 mg Oral Daily Wilburt Finlay, PA-C   0.125 mg at 06/08/13 1125  . doxycycline (VIBRA-TABS) tablet 100 mg  100 mg Oral BID Nada Boozer, NP   100 mg at 06/08/13 1125  . DULoxetine (CYMBALTA) DR capsule 30 mg  30 mg Oral BID PRN Wilburt Finlay, PA-C      . furosemide (LASIX) injection 80 mg  80 mg Intravenous BID Wilburt Finlay, PA-C   80 mg at 06/08/13 1114  . glyBURIDE (DIABETA) tablet 10 mg  10 mg  Oral Q breakfast Nada Boozer, NP   10 mg at 06/08/13 0612  . glyBURIDE (DIABETA) tablet 5 mg  5 mg Oral Q supper Nada Boozer, NP   5 mg at 06/07/13 1755  . HYDROcodone-acetaminophen (NORCO) 10-325 MG per tablet 1 tablet  1 tablet Oral BID Wilburt Finlay, PA-C   1 tablet at 06/07/13 2152  . hydrocortisone cream 1 % 1 application  1 application Topical TID PRN Brittainy Gurski, PA-C      . insulin aspart (novoLOG) injection 0-20 Units  0-20 Units Subcutaneous TID WC Wilburt Finlay, PA-C   4 Units at 06/07/13 1223  . insulin glargine (LANTUS) injection 30 Units  30 Units Subcutaneous QHS Wilburt Finlay, PA-C   30 Units at 06/07/13 2108  . iron polysaccharides (NIFEREX) capsule 150 mg  150 mg Oral BID Wilburt Finlay, PA-C   150 mg at 06/08/13 1123  . levothyroxine (SYNTHROID, LEVOTHROID) tablet 125 mcg  125 mcg Oral QAC breakfast Wilburt Finlay, PA-C   125 mcg at 06/08/13 0533  . linagliptin (TRADJENTA) tablet 5 mg  5 mg Oral Daily Wilburt Finlay, PA-C   5 mg at 06/08/13 1124  . metoprolol tartrate (LOPRESSOR) tablet 37.5 mg  37.5 mg Oral  BID Wilburt Finlay, PA-C   37.5 mg at 06/08/13 1126  . mupirocin ointment (BACTROBAN) 2 %   Nasal BID Nada Boozer, NP      . nitroGLYCERIN (NITRODUR - Dosed in mg/24 hr) patch 0.4 mg  0.4 mg Transdermal QODAY Wilburt Finlay, PA-C      . nitroGLYCERIN (NITROSTAT) SL tablet 0.4 mg  0.4 mg Sublingual Q5 min PRN Wilburt Finlay, PA-C      . ondansetron [facility]) injection 4 mg  4 mg Intravenous Q6H PRN Wilburt Finlay, PA-C      . potassium chloride SA (K-DUR,KLOR-CON) CR tablet 20 mEq  20 mEq Oral BID Wilburt Finlay, PA-C   20 mEq at 06/08/13 1126  . sodium chloride 0.9 % injection 3 mL  3 mL Intravenous Q12H Wilburt Finlay, PA-C   3 mL at 06/06/13 2300  . sodium chloride 0.9 % injection 3 mL  3 mL Intravenous PRN Wilburt Finlay, PA-C   3 mL at 06/07/13 1046  . sodium chloride 0.9 % injection 3 mL  3 mL Intravenous Q12H Nada Boozer, NP   3 mL at 06/07/13 2109  . sodium chloride 0.9 % injection 3 mL  3 mL  Intravenous PRN Nada Boozer, NP      . sodium chloride 0.9 % injection 3 mL  3 mL Intravenous Q12H Brittainy Priebe, PA-C   3 mL at 06/08/13 1127  . sodium chloride 0.9 % injection 3 mL  3 mL Intravenous PRN Brittainy Mcquillen, PA-C      . Vitamin D (Ergocalciferol) (DRISDOL) capsule 50,000 Units  50,000 Units Oral 2 times weekly Wilburt Finlay, PA-C      . warfarin (COUMADIN) tablet 2.5 mg  2.5 mg Oral QODAY Chinita Greenland, RPH   2.5 mg at 06/07/13 1753  . warfarin (COUMADIN) tablet 5 mg  5 mg Oral QODAY Drake Leach Rumbarger, RPH   5 mg at 06/06/13 1704  . Warfarin - Pharmacist Dosing Inpatient   Does not apply q1800 Chinita Greenland, [facility]      . zolpidem (AMBIEN) tablet 10 mg  10 mg Oral QHS PRN Wilburt Finlay, PA-C   10 mg at 06/02/13 2313    PE: General appearance: alert, cooperative and no distress Lungs: clear to auscultation bilaterally Heart: irregularly irregular rhythm Extremities: 2+ bilatearl LEE Pulses: 2+ and symmetric Skin: warm and dry Neurologic: Grossly normal  Lab Results:   Recent Labs  06/06/13 0445 06/07/13 0430  WBC 5.3 5.9  HGB 11.0* 12.1  HCT 35.0* 38.2  PLT 149* 176   BMET  Recent Labs  06/06/13 0445 06/07/13 0430 06/08/13 0440  NA 138 135 136  K 3.7 3.6 3.4*  CL 93* 92* 95*  CO2 35* 34* 35*  GLUCOSE 130* 115* 108*  BUN 66* 65* 67*  CREATININE 2.59* 2.45* 2.40*  CALCIUM 9.4 9.8 9.6   PT/INR  Recent Labs  06/06/13 0445 06/07/13 0430 06/08/13 0440  LABPROT 27.5* 26.5* 27.9*  INR 2.72* 2.59* 2.77*    Assessment/Plan  Principal Problem:   Acute on chronic diastolic HF (heart failure) Active Problems:   DIABETES MELLITUS   OBESITY, MORBID   OBSTRUCTIVE SLEEP APNEA   HYPERTENSION   PACEMAKER, PERMANENT   Solitary kidney, acquired   Cardiomyopathy, nonischemic   PAF (paroxysmal atrial fibrillation), 1st episode since DCCV in Jan/14   CKD (chronic kidney disease) stage 4, GFR 15-29 ml/min   Ulcers of both lower extremities,  small, now with cellulitis  Plan: Metolazone was added yesterday to  BID Lasix. She diuresed 1.7 L in past 24 hours and 12.7 L since admission. Interrogation of her PPM today revealed that she has been and continues in an atrial arrythmia, AT/AF. Will plan for electvic DDCV today with Dr. Royann Shivers.  INR is therapeutic at 2.77. Will need to continue diuresis post-cardioversion.    LOS: 6 days    Brittainy M. Toves, PA-C 06/08/2013 11:41 AM  I have seen and examined the patient along with Brittainy M. Sharol Harness, PA-C.  I have reviewed the chart, notes and new data.  I agree with PA/NP's note.  Improved diuresis after metolazone, without any additional worsening of renal function. K 3.4 being repleted. Therapeutic anticoagulation.  PM interrogation demonstrates persistent atrial fibrillation.  This procedure has been fully reviewed with the patient and written informed consent has been obtained.   PLAN: Cardioversion with anesthesiology assistance for sedation.  Thurmon Fair, MD, [facility] [phone] 06/08/2013, 2:17 PM

## 2013-06-08 NOTE — Op Note (Signed)
Procedure: Electrical Cardioversion Indications:  Atrial Fibrillation  Procedure Details:  Consent: Risks of procedure as well as the alternatives and risks of each were explained to the (patient/caregiver).  Consent for procedure obtained.  Time Out: Verified patient identification, verified procedure, site/side was marked, verified correct patient position, special equipment/implants available, medications/allergies/relevent history reviewed, required imaging and test results available.  Performed  Patient placed on cardiac monitor, pulse oximetry, supplemental oxygen as necessary.  Sedation given: Propofol 50 mg intravenously Dr. Jairo Ben anesthesia Pacer pads placed anterior and posterior chest.  Cardioverted 1 time(s).  Cardioverted at 150J. synchronized biphasic  Evaluation: Findings: Post procedure pacemaker interrogation shows: Atrioventricular sequential paced rhythm Complications: None Patient did tolerate procedure well.  Time Spent Directly with the Patient:  40  minutes   Nekayla Heider 06/08/2013, 3:43 PM

## 2013-06-08 NOTE — Preoperative (Signed)
Beta Blockers   Reason not to administer Beta Blockers:Not Applicable 

## 2013-06-09 ENCOUNTER — Encounter (HOSPITAL_COMMUNITY): Payer: Self-pay | Admitting: Cardiovascular Disease

## 2013-06-09 DIAGNOSIS — I5041 Acute combined systolic (congestive) and diastolic (congestive) heart failure: Secondary | ICD-10-CM

## 2013-06-09 LAB — BASIC METABOLIC PANEL
BUN: 68 mg/dL — ABNORMAL HIGH (ref 6–23)
Chloride: 95 mEq/L — ABNORMAL LOW (ref 96–112)
GFR calc Af Amer: 25 mL/min — ABNORMAL LOW (ref >90)
Potassium: 3.2 mEq/L — ABNORMAL LOW (ref 3.5–5.1)

## 2013-06-09 LAB — GLUCOSE, CAPILLARY
Glucose-Capillary: 119 mg/dL — ABNORMAL HIGH (ref 70–99)
Glucose-Capillary: 163 mg/dL — ABNORMAL HIGH (ref 70–99)

## 2013-06-09 LAB — PROTIME-INR
INR: 2.73 — ABNORMAL HIGH (ref 0.00–1.49)
Prothrombin Time: 27.6 seconds — ABNORMAL HIGH (ref 11.6–15.2)

## 2013-06-09 MED ORDER — TORSEMIDE 20 MG PO TABS
50.0000 mg | ORAL_TABLET | Freq: Two times a day (BID) | ORAL | Status: DC
  Administered 2013-06-09 – 2013-06-11 (×4): 50 mg via ORAL
  Filled 2013-06-09 (×6): qty 1

## 2013-06-09 MED ORDER — POTASSIUM CHLORIDE CRYS ER 20 MEQ PO TBCR
40.0000 meq | EXTENDED_RELEASE_TABLET | Freq: Once | ORAL | Status: AC
  Administered 2013-06-09: 40 meq via ORAL
  Filled 2013-06-09: qty 2

## 2013-06-09 NOTE — Progress Notes (Signed)
Utilization Review Completed Lorianna Spadaccini J. Kasia Trego, RN, BSN, NCM [phone]  

## 2013-06-09 NOTE — Progress Notes (Signed)
ANTICOAGULATION CONSULT NOTE - Follow Up Consult  Pharmacy Consult for warfarin Indication: atrial fibrillation  Allergies  Allergen Reactions  . Ivp Dye (Iodinated Diagnostic Agents) Shortness Of Breath    Turn red, can't breathe  . Betadine (Povidone Iodine)   . Diphenhydramine Hcl Swelling    In hands and eyes  . Fish Allergy Nausea And Vomiting  . Iohexol      Desc: PT TURNS RED AND WHEEZING   . Penicillins Other (See Comments)    Resp arrest as child  . Povidone Other (See Comments)    Wheezing, turn red, can't breath, and blisters  . Promethazine Hcl Other (See Comments)    Low Blood Pressure  . Tape     Blisters, can use paper tape for short periods  . Clindamycin Hives and Rash    wheezing  . Iodine Rash  . Morphine Sulfate Nausea And Vomiting and Rash    Patient Measurements: Height: 5\' 7"  (170.2 cm) Weight: 344 lb 2.2 oz (156.1 kg) IBW/kg (Calculated) : 61.6  Vital Signs: Temp: 96.8 F (36 C) (06/10 0943) Temp src: Oral (06/10 0943) BP: 103/63 mmHg (06/10 0943) Pulse Rate: 82 (06/10 0943)  Labs:  Recent Labs  06/07/13 0430 06/08/13 0440 06/09/13 0502  HGB 12.1  --   --   HCT 38.2  --   --   PLT 176  --   --   LABPROT 26.5* 27.9* 27.6*  INR 2.59* 2.77* 2.73*  CREATININE 2.45* 2.40* 2.30*    Estimated Creatinine Clearance: 38.8 ml/min (by C-G formula based on Cr of 2.3).   Medications:  Scheduled:  . amiodarone  300 mg Oral BID  . calcitRIOL  0.25 mcg Oral BID  . digoxin  0.125 mg Oral Daily  . doxycycline  100 mg Oral BID  . erythromycin   Both Eyes Q8H  . furosemide  80 mg Intravenous BID  . glyBURIDE  10 mg Oral Q breakfast  . glyBURIDE  5 mg Oral Q supper  . HYDROcodone-acetaminophen  1 tablet Oral BID  . insulin aspart  0-20 Units Subcutaneous TID WC  . insulin glargine  30 Units Subcutaneous QHS  . iron polysaccharides  150 mg Oral BID  . levothyroxine  125 mcg Oral QAC breakfast  . linagliptin  5 mg Oral Daily  . metoprolol  tartrate  37.5 mg Oral BID  . mupirocin ointment   Nasal BID  . nitroGLYCERIN  0.4 mg Transdermal QODAY  . potassium chloride SA  20 mEq Oral BID  . potassium chloride  40 mEq Oral Once  . sodium chloride  3 mL Intravenous Q12H  . sodium chloride  3 mL Intravenous Q12H  . sodium chloride  3 mL Intravenous Q12H  . Vitamin D (Ergocalciferol)  50,000 Units Oral 2 times weekly  . warfarin  2.5 mg Oral QODAY  . warfarin  5 mg Oral QODAY  . Warfarin - Pharmacist Dosing Inpatient   Does not apply q1800    Assessment: 63 y/o female patient admitted with c/o SOB. She continues on warfarin for afib. Her INR remains therapeutic on her home regimen.  Goal of Therapy:  INR 2-3   Plan:  1. Continue home regimen of alternating 2.5mg  and 5mg  of warfarin  2. F/u AM INR  Verlene Mayer, PharmD, [place] Pager [phone]  06/09/2013 10:23 AM

## 2013-06-09 NOTE — Progress Notes (Signed)
Subjective: Breathing better.  Ambulating further today.  Objective: Vital signs in last 24 hours: Temp:  [96.8 F (36 C)-98 F (36.7 C)] 96.8 F (36 C) (06/10 0943) Pulse Rate:  [71-82] 82 (06/10 0943) Resp:  [20] 20 (06/10 0943) BP: (102-123)/(58-78) 103/63 mmHg (06/10 0943) SpO2:  [96 %-100 %] 97 % (06/10 0943) Weight:  [344 lb 2.2 oz (156.1 kg)] 344 lb 2.2 oz (156.1 kg) (06/10 0533) Last BM Date: 06/07/13  Intake/Output from previous day: 06/09 0701 - 06/10 0700 In: 800 [P.O.:700; I.V.:100] Out: 4026 [Urine:4025; Stool:1] Intake/Output this shift:    Medications Current Facility-Administered Medications  Medication Dose Route Frequency Provider Last Rate Last Dose  . 0.9 %  sodium chloride infusion  250 mL Intravenous PRN Wilburt Finlay, PA-C      . 0.9 %  sodium chloride infusion  250 mL Intravenous Continuous Nada Boozer, NP      . 0.9 %  sodium chloride infusion  250 mL Intravenous Continuous Brittainy Tiu, PA-C      . acetaminophen (TYLENOL) tablet 650 mg  650 mg Oral Q4H PRN Wilburt Finlay, PA-C   650 mg at 06/08/13 4098  . albuterol (PROVENTIL) (5 MG/ML) 0.5% nebulizer solution 2.5 mg  2.5 mg Nebulization Q6H PRN Wilburt Finlay, PA-C      . ALPRAZolam Prudy Feeler) tablet 0.25 mg  0.25 mg Oral TID PRN Wilburt Finlay, PA-C   0.25 mg at 06/08/13 2222  . amiodarone (PACERONE) tablet 300 mg  300 mg Oral BID Thurmon Fair, MD   300 mg at 06/09/13 0947  . calcitRIOL (ROCALTROL) capsule 0.25 mcg  0.25 mcg Oral BID Wilburt Finlay, PA-C   0.25 mcg at 06/09/13 0947  . digoxin (LANOXIN) tablet 0.125 mg  0.125 mg Oral Daily Wilburt Finlay, PA-C   0.125 mg at 06/09/13 0948  . doxycycline (VIBRA-TABS) tablet 100 mg  100 mg Oral BID Nada Boozer, NP   100 mg at 06/09/13 0806  . DULoxetine (CYMBALTA) DR capsule 30 mg  30 mg Oral BID PRN Wilburt Finlay, PA-C      . erythromycin ophthalmic ointment   Both Eyes Q8H Nashali Ditmer, MD      . furosemide (LASIX) injection 80 mg  80 mg Intravenous BID  Wilburt Finlay, PA-C   80 mg at 06/09/13 0729  . glyBURIDE (DIABETA) tablet 10 mg  10 mg Oral Q breakfast Nada Boozer, NP   10 mg at 06/09/13 0631  . glyBURIDE (DIABETA) tablet 5 mg  5 mg Oral Q supper Nada Boozer, NP   5 mg at 06/08/13 1812  . HYDROcodone-acetaminophen (NORCO) 10-325 MG per tablet 1 tablet  1 tablet Oral BID Wilburt Finlay, PA-C   1 tablet at 06/08/13 2222  . hydrocortisone cream 1 % 1 application  1 application Topical TID PRN Brittainy Borges, PA-C      . insulin aspart (novoLOG) injection 0-20 Units  0-20 Units Subcutaneous TID WC Wilburt Finlay, PA-C   4 Units at 06/07/13 1223  . insulin glargine (LANTUS) injection 30 Units  30 Units Subcutaneous QHS Wilburt Finlay, PA-C   30 Units at 06/08/13 2215  . iron polysaccharides (NIFEREX) capsule 150 mg  150 mg Oral BID Wilburt Finlay, PA-C   150 mg at 06/09/13 0947  . levothyroxine (SYNTHROID, LEVOTHROID) tablet 125 mcg  125 mcg Oral QAC breakfast Wilburt Finlay, PA-C   125 mcg at 06/09/13 0631  . linagliptin (TRADJENTA) tablet 5 mg  5 mg Oral Daily Wilburt Finlay, PA-C  5 mg at 06/09/13 0949  . metoprolol tartrate (LOPRESSOR) tablet 37.5 mg  37.5 mg Oral BID Wilburt Finlay, PA-C   37.5 mg at 06/09/13 0948  . mupirocin ointment (BACTROBAN) 2 %   Nasal BID Nada Boozer, NP      . nitroGLYCERIN (NITRODUR - Dosed in mg/24 hr) patch 0.4 mg  0.4 mg Transdermal QODAY Wilburt Finlay, PA-C      . nitroGLYCERIN (NITROSTAT) SL tablet 0.4 mg  0.4 mg Sublingual Q5 min PRN Wilburt Finlay, PA-C      . ondansetron [facility]) injection 4 mg  4 mg Intravenous Q6H PRN Wilburt Finlay, PA-C      . potassium chloride SA (K-DUR,KLOR-CON) CR tablet 20 mEq  20 mEq Oral BID Wilburt Finlay, PA-C   20 mEq at 06/09/13 0947  . sodium chloride 0.9 % injection 3 mL  3 mL Intravenous Q12H Wilburt Finlay, PA-C   3 mL at 06/06/13 2300  . sodium chloride 0.9 % injection 3 mL  3 mL Intravenous PRN Wilburt Finlay, PA-C   3 mL at 06/07/13 1046  . sodium chloride 0.9 % injection 3 mL  3 mL Intravenous Q12H  Nada Boozer, NP   3 mL at 06/07/13 2109  . sodium chloride 0.9 % injection 3 mL  3 mL Intravenous PRN Nada Boozer, NP      . sodium chloride 0.9 % injection 3 mL  3 mL Intravenous Q12H Brittainy Burcher, PA-C   3 mL at 06/09/13 0951  . sodium chloride 0.9 % injection 3 mL  3 mL Intravenous PRN Brittainy Wohl, PA-C      . Vitamin D (Ergocalciferol) (DRISDOL) capsule 50,000 Units  50,000 Units Oral 2 times weekly Wilburt Finlay, PA-C   50,000 Units at 06/08/13 1247  . warfarin (COUMADIN) tablet 2.5 mg  2.5 mg Oral QODAY Chinita Greenland, RPH   2.5 mg at 06/07/13 1753  . warfarin (COUMADIN) tablet 5 mg  5 mg Oral QODAY Drake Leach Rumbarger, RPH   5 mg at 06/08/13 1835  . Warfarin - Pharmacist Dosing Inpatient   Does not apply q1800 Chinita Greenland, [facility]      . zolpidem (AMBIEN) tablet 10 mg  10 mg Oral QHS PRN Wilburt Finlay, PA-C   10 mg at 06/02/13 2313    PE: General appearance: alert, cooperative and no distress Lungs: clear to auscultation bilaterally Heart: regular rate and rhythm, S1, S2 normal, no murmur, click, rub or gallop Extremities: Chronic edema Pulses: 2+ and symmetric Neurologic: Grossly normal  Lab Results:   Recent Labs  06/07/13 0430  WBC 5.9  HGB 12.1  HCT 38.2  PLT 176   BMET  Recent Labs  06/07/13 0430 06/08/13 0440 06/09/13 0502  NA 135 136 138  K 3.6 3.4* 3.2*  CL 92* 95* 95*  CO2 34* 35* 33*  GLUCOSE 115* 108* 142*  BUN 65* 67* 68*  CREATININE 2.45* 2.40* 2.30*  CALCIUM 9.8 9.6 9.6   PT/INR  Recent Labs  06/07/13 0430 06/08/13 0440 06/09/13 0502  LABPROT 26.5* 27.9* 27.6*  INR 2.59* 2.77* 2.73*   Cholesterol No results found for this basename: CHOL,  in the last 72 hours Cardiac Enzymes No components found with this basename: TROPONIN,  CKMB,   Studies/Results: @RISRSLT2 @   Assessment/Plan  Principal Problem:   Acute on chronic diastolic HF (heart failure) Active Problems:   DIABETES MELLITUS   OBESITY, MORBID    OBSTRUCTIVE SLEEP APNEA   HYPERTENSION   PACEMAKER, PERMANENT  Solitary kidney, acquired   Cardiomyopathy, nonischemic   PAF (paroxysmal atrial fibrillation), 1st episode since DCCV in Jan/14   CKD (chronic kidney disease) stage 4, GFR 15-29 ml/min   Ulcers of both lower extremities, small, now with cellulitis  Plan:  Net fluids:  -3.2L(24hrs)/-16L(adm).  Mildly improved SCr.  Consider changing to PO lasix.  Giving extra Po K+ now.  Weak, but has not walked further than the bathroom until today.     LOS: 7 days    [name], [name] 06/09/2013 9:54 AM  I have seen and examined the patient along with Wilburt Finlay, PA.  I have reviewed the chart, notes and new data.  I agree with PA's note.  Key new complaints: worried she may go back in AF Key examination changes: less edema, but still severe swelling of calves Key new findings / data: Apaced-Vpaced rhythm on monitor, K 3.2  PLAN: Switch to oral diuretics. Replace K DC tomorrow if remains in net negative or even fluid balance on PO meds.   Thurmon Fair, MD, [facility] [phone] 06/09/2013, 6:34 PM

## 2013-06-09 NOTE — Progress Notes (Signed)
CARDIAC REHAB PHASE I   PRE:  Rate/Rhythm: 70 paced    BP: sitting 80/50 with large cuff manual on forearm    SaO2:   MODE:  Ambulation: 200 ft   POST:  Rate/Rhythm: 77 paced    BP: sitting 80/50 large cuff manual on forearm    109/64 with regular size cuff dinamapp forearm     SaO2: 99 RA  Pt used RW, independent. Rest every 40-50 ft and when talking. Seemed to tolerate fairly well, she sts about at baseline. Heard BP at 80/50 in forearm but think cuff was too big. 109/64 with regular size cuff by machine. Return to straight back recliner. Could not convince to put feet up. Pt needs RW for home, which she agrees to. Please order. [phone]  Elissa Lovett [place] CES, ACSM 06/09/2013 8:58 AM

## 2013-06-10 DIAGNOSIS — E876 Hypokalemia: Secondary | ICD-10-CM

## 2013-06-10 LAB — GLUCOSE, CAPILLARY
Glucose-Capillary: 180 mg/dL — ABNORMAL HIGH (ref 70–99)
Glucose-Capillary: 96 mg/dL (ref 70–99)
Glucose-Capillary: 108 mg/dL — ABNORMAL HIGH (ref 70–99)

## 2013-06-10 LAB — BASIC METABOLIC PANEL
CO2: 33 mEq/L — ABNORMAL HIGH (ref 19–32)
Glucose, Bld: 147 mg/dL — ABNORMAL HIGH (ref 70–99)
Potassium: 3.3 mEq/L — ABNORMAL LOW (ref 3.5–5.1)
Sodium: 138 mEq/L (ref 135–145)

## 2013-06-10 MED ORDER — POTASSIUM CHLORIDE CRYS ER 20 MEQ PO TBCR
20.0000 meq | EXTENDED_RELEASE_TABLET | Freq: Once | ORAL | Status: AC
  Administered 2013-06-10: 20 meq via ORAL

## 2013-06-10 MED ORDER — POTASSIUM CHLORIDE CRYS ER 20 MEQ PO TBCR
40.0000 meq | EXTENDED_RELEASE_TABLET | Freq: Once | ORAL | Status: AC
  Administered 2013-06-10: 40 meq via ORAL
  Filled 2013-06-10: qty 2

## 2013-06-10 MED ORDER — WARFARIN SODIUM 1 MG PO TABS
1.0000 mg | ORAL_TABLET | Freq: Once | ORAL | Status: AC
  Administered 2013-06-10: 1 mg via ORAL
  Filled 2013-06-10: qty 1

## 2013-06-10 NOTE — Progress Notes (Signed)
ANTICOAGULATION CONSULT NOTE - Follow Up Consult  Pharmacy Consult for warfarin Indication: atrial fibrillation   Patient Measurements: Height: 5\' 7"  (170.2 cm) Weight: 346 lb 9 oz (157.2 kg) IBW/kg (Calculated) : 61.6  Vital Signs: Temp: 97.9 F (36.6 C) (06/11 0622) Temp src: Oral (06/11 0622) BP: 91/54 mmHg (06/11 0622) Pulse Rate: 70 (06/11 0622)  Labs:  Recent Labs  06/08/13 0440 06/09/13 0502 06/10/13 0505  LABPROT 27.9* 27.6* 31.0*  INR 2.77* 2.73* 3.20*  CREATININE 2.40* 2.30* 2.61*    Estimated Creatinine Clearance: 34.3 ml/min (by C-G formula based on Cr of 2.61).   Medications:  Scheduled:  . amiodarone  300 mg Oral BID  . calcitRIOL  0.25 mcg Oral BID  . digoxin  0.125 mg Oral Daily  . doxycycline  100 mg Oral BID  . furosemide  80 mg Intravenous BID  . glyBURIDE  10 mg Oral Q breakfast  . glyBURIDE  5 mg Oral Q supper  . HYDROcodone-acetaminophen  1 tablet Oral BID  . insulin aspart  0-20 Units Subcutaneous TID WC  . insulin glargine  30 Units Subcutaneous QHS  . iron polysaccharides  150 mg Oral BID  . levothyroxine  125 mcg Oral QAC breakfast  . linagliptin  5 mg Oral Daily  . metoprolol tartrate  37.5 mg Oral BID  . mupirocin ointment   Nasal BID  . nitroGLYCERIN  0.4 mg Transdermal QODAY  . potassium chloride SA  20 mEq Oral BID  . sodium chloride  3 mL Intravenous Q12H  . sodium chloride  3 mL Intravenous Q12H  . sodium chloride  3 mL Intravenous Q12H  . torsemide  50 mg Oral BID  . Vitamin D (Ergocalciferol)  50,000 Units Oral 2 times weekly  . warfarin  1 mg Oral ONCE-1800  . Warfarin - Pharmacist Dosing Inpatient   Does not apply q1800    Assessment: 63 y/o female patient admitted with c/o SOB. She continues on warfarin for afib. Her INR is 3.2 which is slightly above her goal of 2-3.  Goal of Therapy:  INR 2-3   Plan:  1. Reduce warfarin dose to 1mg  x 1 dose today  2. F/u AM INR  Celedonio Miyamoto, PharmD, [facility] Clinical  Pharmacist Pager [phone]   06/10/2013 9:48 AM

## 2013-06-10 NOTE — Progress Notes (Signed)
Subjective: Breathing better; had significant leg cramps when trying to walk necessitating stopping 9 times earlier today.  Objective: Vital signs in last 24 hours: Temp:  [97.4 F (36.3 C)-97.9 F (36.6 C)] 97.7 F (36.5 C) (06/11 1340) Pulse Rate:  [70-73] 70 (06/11 1340) Resp:  [18-20] 18 (06/11 1340) BP: (91-118)/(54-72) 111/65 mmHg (06/11 1340) SpO2:  [94 %-100 %] 100 % (06/11 1340) Weight:  [346 lb 9 oz (157.2 kg)] 346 lb 9 oz (157.2 kg) (06/11 0622) Weight change: 2 lb 6.8 oz (1.1 kg) Last BM Date: 06/10/13 Intake/Output from previous day: -610 (-16735 since admit) wt 346.9 up 2 lbs since yesterday.  Overall still down from 354 on admit. 06/10 0701 - 06/11 0700 In: 1920 [P.O.:1920] Out: 2650 [Urine:2650] Intake/Output this shift: Total I/O In: 720 [P.O.:720] Out: 1200 [Urine:1200]  PE: Exam per MD Alert, no distress No JVD No rales or wheezes RRR paced 1/6 sem nontender abdomen 2+ pulses Chronic mild edema nonfocal neuro  TELE:  AV pacing  Lab Results: No results found for this basename: WBC, HGB, HCT, PLT,  in the last 72 hours BMET  Recent Labs  06/09/13 0502 06/10/13 0505  NA 138 138  K 3.2* 3.3*  CL 95* 93*  CO2 33* 33*  GLUCOSE 142* 147*  BUN 68* 76*  CREATININE 2.30* 2.61*  CALCIUM 9.6 9.2   No results found for this basename: TROPONINI, CK, MB,  in the last 72 hours  No results found for this basename: CHOL,  HDL,  LDLCALC,  LDLDIRECT,  TRIG,  CHOLHDL   Lab Results  Component Value Date   HGBA1C 6.9 05/26/2013     Lab Results  Component Value Date   TSH 2.386 06/02/2013    Studies/Results: No results found.  Medications: I have reviewed the patient's current medications. Scheduled Meds: . amiodarone  300 mg Oral BID  . calcitRIOL  0.25 mcg Oral BID  . digoxin  0.125 mg Oral Daily  . doxycycline  100 mg Oral BID  . glyBURIDE  10 mg Oral Q breakfast  . glyBURIDE  5 mg Oral Q supper  . HYDROcodone-acetaminophen  1  tablet Oral BID  . insulin aspart  0-20 Units Subcutaneous TID WC  . insulin glargine  30 Units Subcutaneous QHS  . iron polysaccharides  150 mg Oral BID  . levothyroxine  125 mcg Oral QAC breakfast  . linagliptin  5 mg Oral Daily  . metoprolol tartrate  37.5 mg Oral BID  . mupirocin ointment   Nasal BID  . nitroGLYCERIN  0.4 mg Transdermal QODAY  . potassium chloride SA  20 mEq Oral BID  . potassium chloride  20 mEq Oral Once  . sodium chloride  3 mL Intravenous Q12H  . sodium chloride  3 mL Intravenous Q12H  . sodium chloride  3 mL Intravenous Q12H  . torsemide  50 mg Oral BID  . Vitamin D (Ergocalciferol)  50,000 Units Oral 2 times weekly  . warfarin  1 mg Oral ONCE-1800  . Warfarin - Pharmacist Dosing Inpatient   Does not apply q1800   Continuous Infusions: . sodium chloride    . sodium chloride     PRN Meds:.sodium chloride, acetaminophen, albuterol, ALPRAZolam, DULoxetine, hydrocortisone cream, nitroGLYCERIN, ondansetron (ZOFRAN) IV, sodium chloride, sodium chloride, sodium chloride, zolpidem  Assessment/Plan: Principal Problem:   Acute on chronic diastolic HF (heart failure) Active Problems:   DIABETES MELLITUS   OBESITY, MORBID   OBSTRUCTIVE SLEEP APNEA  HYPERTENSION   HYPERTENSION, PULMONARY   PACEMAKER, PERMANENT   Obesity hypoventilation syndrome   Solitary kidney, acquired   Cardiomyopathy, nonischemic   PAF (paroxysmal atrial fibrillation), 1st episode since DCCV in Jan/14   CKD (chronic kidney disease) stage 4, GFR 15-29 ml/min   Ulcers of both lower extremities, small, now with cellulitis   Hypokalemia, replacing  PLAN: INR therapeutic, a little high.  Glucose stable.  IV lasix was changed to po torsemide yesterday. Though she rec'd both doses of Lasix IV and the torsemide. Cr. Elevated to 2.61 has been a little labile but up from 2.12 on admit.  K+ low will replace.   Walked a little further today.  Many stops.  ? D/c today or tomorrow.  LOS: 8 days    Time spent with pt. :20 minutes. Nada Boozer  Nurse Practitioner Certified Pager [phone] 06/10/2013, 3:03 PM   Patient seen and examined. Agree with assessment and plan. Maintaining AV pacing. Hypokalemia contributing to cramps.; being repleted. Check MG. Keep[ today. F/U labs in am with plan for dc tomorrow.   Lennette Bihari, MD, [facility] 06/10/2013 3:15 PM

## 2013-06-10 NOTE — Progress Notes (Signed)
CARDIAC REHAB PHASE I   PRE:  Rate/Rhythm: 72 paced    BP: sitting 110/59    SaO2:   MODE:  Ambulation: 300 ft   POST:  Rate/Rhythm: 77 paced    BP: sitting 113/68     SaO2: 100 RA  Pt c/o leg pain/cramps. Also c/o SOB. Many rest stops. Probably her baseline. Did increase distance (distracted by talking). Pt needs RW for home. [id number]   [name] [place] CES, ACSM 06/10/2013 10:16 AM

## 2013-06-10 NOTE — Care Management Note (Signed)
    Page 1 of 2   06/10/2013     10:57:18 AM   CARE MANAGEMENT NOTE 06/10/2013  Patient:  [name],[name]   Account Number:  [id number]  Date Initiated:  06/03/2013  Documentation initiated by:  [facility]  Subjective/Objective Assessment:   63 year old, morbidly obese woman with a history of recurrent persistent atrial fibrillation in  the setting of mild to moderate nonischemic cardiomyopathy     Action/Plan:   IV lasix 80mg  BID with plan for DCCV later in the week. Checking PT/INR, TSH, BNP, BMET, CBC, CXR.  Monitor K+//home with home health   Anticipated DC Date:  06/06/2013   Anticipated DC Plan:  HOME W HOME HEALTH SERVICES      DC Planning Services  CM consult      [facility] Choice  HOME HEALTH   Choice offered to / List presented to:  C-1 Patient   DME arranged  WALKER - WIDE      DME agency  [facility]     [facility] arranged  HH - 11 Patient Refused      Status of service:  Completed, signed off Medicare Important Message given?   (If response is "NO", the following Medicare IM given date fields will be blank) Date Medicare IM given:   Date Additional Medicare IM given:    Discharge Disposition:    Per UR Regulation:  Reviewed for med. necessity/level of care/duration of stay  If discussed at Long Length of Stay Meetings, dates discussed:   06/09/2013    Comments:  06/10/13 @ 1015.Marland KitchenMarland KitchenOletta Cohn, RN, BSN, [place] [phone] Per Lyndee Leo with Cardiac rehab. pt qualifies for rolling walker.  Order placed; Jill Alexanders of [facility] notified.  06/03/13@ 1430.Marland KitchenMarland KitchenOletta Cohn, RN, BSN, [employer] [phone] Spoke with pt at beside regarding discharge planning and need for home health services of RN for disease mangement of CHF.  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  CM to follow up prior to discharge for any changes.  No DME needs identified at this time.

## 2013-06-10 NOTE — Clinical Documentation Improvement (Signed)
  DOCUMENTATION CLARIFICATIONS ARE NOT PART OF THE PERMANENT MEDICAL RECORD   Please update your documentation within the medical record to reflect your response to this query.                                                                                      06/10/13  Dr. Royann Shivers,  In a better effort to capture your patient's severity of illness, reflect appropriate length of stay and utilization of resources, a review of the patient medical record has revealed the following information:   "Acute on chronic diastolic HF"  Current admission diagnosis per H&P and Progress Notes. Progress Notes signed by Thurmon Fair, MD at 06/09/2013 6:36 PM   Echo  06/04/13  Study Conclusions - Left ventricle: The cavity size was moderately dilated.  Wall thickness was normal. Systolic function was moderately to severely reduced. The estimated ejection fraction was in the range of 30% to 35%.  Diffuse hypokinesis.  The study is not technically sufficient to allow evaluation of LV diastolic function. - Mitral valve: Severe regurgitation. - Left atrium: The atrium was severely dilated. - Right ventricle: The cavity size was mildly dilated.  Systolic pressure was increased. - Right atrium: The atrium was moderately to severely dilated. - Atrial septum: A septal defect cannot be excluded. - Tricuspid valve: Severe regurgitation. - Pulmonary arteries: PA peak pressure: 48mm Hg (S). Impressions: The right ventricular systolic pressure was increased consistent with moderate pulmonary hypertension.   Previous Echo 07/15/2012 EF 40-45%, Moderate to Severe MR, Impaired LV relaxation....complete report in [facility] under Procedures.  (previously scanned in)    Based on your clinical judgment, please document in the progress notes and discharge summary if a condition below provides greater specificity regarding the patient's heart failure:    - Acute on Chronic Systolic and Diastolic Heart Failure   -  Other Type of Heart Failure   - Unable to Clinically Determine   In responding to this query please exercise your independent judgment.     The fact that a query is asked, does not imply that any particular answer is desired or expected.    Reviewed: additional documentation in the medical record  CORRECT DIAGNOSIS IS ACUTE ON CHRONIC COMBINED SYSTOLIC AND DIASTOLIC HEART FAILURE  Thank You,  Jerral Ralph  RN BSN CCDS Certified Clinical Documentation Specialist: Cell   [phone]  Health Information Management [place]    TO RESPOND TO THE THIS QUERY, FOLLOW THE INSTRUCTIONS BELOW:  1. If needed, update documentation for the patient's encounter via the notes activity.  2. Access this query again and click edit on the In [employer].  3. After updating, or not, click F2 to complete all highlighted (required) fields concerning your review. Select "additional documentation in the medical record" OR "no additional documentation provided".  4. Click Sign note button.  5. The deficiency will fall out of your In Basket *Please let us know if you are not able to complete this workflow by phone or e-mail (listed below).

## 2013-06-11 ENCOUNTER — Encounter: Payer: Self-pay | Admitting: Cardiovascular Disease

## 2013-06-11 DIAGNOSIS — L03119 Cellulitis of unspecified part of limb: Secondary | ICD-10-CM | POA: Diagnosis not present

## 2013-06-11 LAB — GLUCOSE, CAPILLARY: Glucose-Capillary: 118 mg/dL — ABNORMAL HIGH (ref 70–99)

## 2013-06-11 LAB — BASIC METABOLIC PANEL
BUN: 83 mg/dL — ABNORMAL HIGH (ref 6–23)
CO2: 32 mEq/L (ref 19–32)
Glucose, Bld: 117 mg/dL — ABNORMAL HIGH (ref 70–99)
Potassium: 4 mEq/L (ref 3.5–5.1)
Sodium: 136 mEq/L (ref 135–145)

## 2013-06-11 MED ORDER — DIGOXIN 62.5 MCG PO TABS: 0.0625 mg | ORAL_TABLET | Freq: Every day | ORAL | Status: DC

## 2013-06-11 MED ORDER — DOXYCYCLINE HYCLATE 100 MG PO TABS: 100.0000 mg | ORAL_TABLET | Freq: Two times a day (BID) | ORAL | Status: AC

## 2013-06-11 MED ORDER — AMIODARONE HCL 100 MG PO TABS: 300.0000 mg | ORAL_TABLET | Freq: Two times a day (BID) | ORAL | Status: DC

## 2013-06-11 MED ORDER — DIGOXIN 0.0625 MG HALF TABLET: 0.0625 mg | ORAL_TABLET | Freq: Every day | ORAL | Status: DC

## 2013-06-11 NOTE — Plan of Care (Signed)
Problem: Discharge Progression Outcomes Goal: Discharge plan in place and appropriate Outcome: Progressing Patient is pending discharge 06/11/13 if electrolytes are appropriate.  Transitioned to PO Torsemide for fluid maintenance.  Monitoring intake and output, daily weights.  No complications this shift.  Will continue to monitor.

## 2013-06-11 NOTE — Discharge Summary (Signed)
Physician Discharge Summary  Patient ID: [name] MRN: [id number] DOB/AGE: 08-10-1950 63 y.o.  Admit date: 06/02/2013 Discharge date: 06/11/2013  Admission Diagnoses: Acute on chronic combined systolic and diastolic HF (heart failure)  Discharge Diagnoses:  Principal Problem:   Acute on chronic combined systolic and diastolic HF (heart failure) Active Problems:   DIABETES MELLITUS   OBESITY, MORBID   OBSTRUCTIVE SLEEP APNEA   HYPERTENSION   HYPERTENSION, PULMONARY   PACEMAKER, PERMANENT   Obesity hypoventilation syndrome   Solitary kidney, acquired   Cardiomyopathy, nonischemic   PAF (paroxysmal atrial fibrillation), 1st episode since DCCV in Jan/14   CKD (chronic kidney disease) stage 4, GFR 15-29 ml/min   Ulcers of both lower extremities, small, now with cellulitis   Hypokalemia, replacing   Cellulitis of lower leg   Discharged Condition: stable  Hospital Course:   The patient is a 63 year old, morbidly obese woman with a history of recurrent persistent atrial fibrillation in  the setting of mild to moderate nonischemic cardiomyopathy, hypothyroidism, DM2, severe mitral insufficiency, moderate to severe  tricuspid insufficiency and severe pulmonary hypertension, OSA on CPAP. She is now roughly 6 months status post an  elective direct current cardioversion and has successfully maintained normal rhythm. Interrogation of her  pacemaker today shows underlying atrial fibrillation. The patient did under-go DCCV on 02/06/13. She was 20# heavier and complaining of SOB, orthopnea and not being able to sleep at admission. She denied N, V, fever, CP, dizziness, ABD pain, hematuria, hematochezia.  She was admitted to telemetry for IV diuretic therapy which resulted in a net diuresis of -17.8 liters.  She was eventually changed to PO torsemide.  Potassium was corrected with supplementation.  2D echo revealed an EF of 30-35% with diffuse hypokinesis.  LA severely dilated. Severe MR.RA mod to  severe dilation. Mod pulm HTN.  She developed cellulitis in her lower extremities and was started on doxycycline for ten days.  She was cardioverted on 06/08/13.  INR was therapeutic for the 30 days prior to the procedure so TEE was not required.   Due to worsening kidney function digoxin was decreased to 0.0625mg .  Cardiac rehab was consulted to start working with the patient.   The patient was seen by Dr. Tresa Endo who felt she was stable for DC home.  TCM 7 follow up was arranged.   Consults:  Wound consult, cardiac rehab  Significant Diagnostic Studies:  2 D echo Study Conclusions  - Left ventricle: The cavity size was moderately dilated. Wall thickness was normal. Systolic function was moderately to severely reduced. The estimated ejection fraction was in the range of 30% to 35%. Diffuse hypokinesis. The study is not technically sufficient to allow evaluation of LV diastolic function. - Mitral valve: Severe regurgitation. - Left atrium: The atrium was severely dilated. - Right ventricle: The cavity size was mildly dilated. Systolic pressure was increased. - Right atrium: The atrium was moderately to severely dilated. - Atrial septum: A septal defect cannot be excluded. - Tricuspid valve: Severe regurgitation. - Pulmonary arteries: PA peak pressure: 48mm Hg (S). Impressions:  - The right ventricular systolic pressure was increased consistent with moderate pulmonary hypertension.   CBC    Component Value Date/Time   WBC 5.9 06/07/2013 0430   WBC 5.8 07/21/2009 1141   RBC 4.19 06/07/2013 0430   RBC 3.92 07/21/2009 1141   HGB 12.1 06/07/2013 0430   HGB 11.9 07/21/2009 1141   HCT 38.2 06/07/2013 0430   HCT 36.2 07/21/2009 1141   PLT  176 06/07/2013 0430   PLT 153 07/21/2009 1141   MCV 91.2 06/07/2013 0430   MCV 92.4 07/21/2009 1141   MCH 28.9 06/07/2013 0430   MCH 30.3 07/21/2009 1141   MCHC 31.7 06/07/2013 0430   MCHC 32.8 07/21/2009 1141   RDW 19.1* 06/07/2013 0430   RDW 16.5* 07/21/2009 1141    LYMPHSABS 1.0 03/23/2013 0900   LYMPHSABS 0.9 07/21/2009 1141   MONOABS 0.3 03/23/2013 0900   MONOABS 0.3 07/21/2009 1141   EOSABS 0.0 03/23/2013 0900   EOSABS 0.1 07/21/2009 1141   BASOSABS 0.0 03/23/2013 0900   BASOSABS 0.0 07/21/2009 1141    BMET    Component Value Date/Time   NA 136 06/11/2013 0507   K 4.0 06/11/2013 0507   CL 93* 06/11/2013 0507   CO2 32 06/11/2013 0507   GLUCOSE 117* 06/11/2013 0507   BUN 83* 06/11/2013 0507   CREATININE 2.57* 06/11/2013 0507   CREATININE 1.60* 10/27/2012 0800   CREATININE 1.28* 02/12/2011 1000   CALCIUM 9.4 06/11/2013 0507   CALCIUM 9.5 03/24/2012 1212   GFRNONAA 19* 06/11/2013 0507   GFRAA 22* 06/11/2013 0507      Treatments: See above.   Discharge Exam: Blood pressure 102/52, pulse 70, temperature 97.2 F (36.2 C), temperature source Oral, resp. rate 20, height 5\' 7"  (1.702 m), weight 347 lb 14.4 oz (157.806 kg), SpO2 100.00%.   Disposition: 01-Home or Self Care      Discharge Orders   Future Appointments Provider Department Dept Phone   06/17/2013 9:00 AM Nada Boozer, NP [facility] HEART AND VASCULAR CENTER [place] [phone]   07/16/2013 2:45 PM Thurmon Fair, MD [facility]ART AND VASCULAR C[facility] [place] [phone]   07/23/2013 10:30 AM [facility] [phone]   08/27/2013 2:30 PM David Stall, MD [facility] End[facility] [phone]   09/21/2013 10:10 AM [facility]ER CENTER [phone]   09/29/2013 10:30 AM Randall An, MD [facility] CANCER CENTER [phone]   04/09/2014 1:30 PM Waymon Budge, MD [place] [facility] [phone]   Future Orders Complete By Expires     Diet - low sodium heart healthy  As directed     Discharge instructions  As directed     Comments:      One of our Advanced Practice Practitioners will call you within two days.  Your follow-up appt will be within seven days.    Increase activity slowly  As directed          Medication List    TAKE these medications       albuterol 108 (90 BASE) MCG/ACT inhaler  Commonly known as:  PROVENTIL HFA;VENTOLIN HFA  Inhale 2 puffs into the lungs every 6 (six) hours as needed. For shortness of breath     albuterol (2.5 MG/3ML) 0.083% nebulizer solution  Commonly known as:  PROVENTIL  Take 2.5 mg by nebulization every 6 (six) hours as needed. For shortness of breath     ALPRAZolam 0.5 MG tablet  Commonly known as:  XANAX  Take 0.25 mg by mouth 3 (three) times daily as needed. For anxiety     AMBIEN 10 MG tablet  Generic drug:  zolpidem  Take 10 mg by mouth at bedtime as needed. sleep     amiodarone 100 MG tablet  Commonly known as:  CORDARONE  Take 3 tablets (300 mg total) by mouth 2 (two) times daily.     calcitRIOL 0.25 MCG capsule  Commonly known as:  ROCALTROL  Take 0.25 mcg by mouth 2 (two) times daily.     calcium citrate-vitamin D 200-200 MG-UNIT Tabs  Take 1 tablet by mouth 4 (four) times daily -  before meals and at bedtime.     Digoxin 62.5 MCG Tabs  Take 0.0625 mg by mouth daily.  Start taking on:  06/12/2013     doxycycline 100 MG tablet  Commonly known as:  VIBRA-TABS  Take 1 tablet (100 mg total) by mouth 2 (two) times daily.     DULoxetine 30 MG capsule  Commonly known as:  CYMBALTA  Take 30 mg by mouth 2 (two) times daily as needed. For pain     glyBURIDE 5 MG tablet  Commonly known as:  DIABETA  Take 5-10 mg by mouth 2 (two) times daily with a meal. Take 10mg  in the morning and 5mg  at night     HYDROcodone-acetaminophen 10-325 MG per tablet  Commonly known as:  NORCO  Take 1 tablet by mouth 2 (two) times daily.     insulin glargine 100 UNIT/ML injection  Commonly known as:  LANTUS  Inject 30 Units into the skin at bedtime.     iron polysaccharides 150 MG capsule  Commonly known as:  NIFEREX  Take 150 mg by mouth 2 (two) times daily.     levothyroxine 125 MCG tablet  Commonly known as:  SYNTHROID, LEVOTHROID  Take  125 mcg by mouth daily before breakfast.     metoprolol tartrate 25 MG tablet  Commonly known as:  LOPRESSOR  Take 37.5 mg by mouth 2 (two) times daily.     multivitamin tablet  Take 1 tablet by mouth daily.     nitroGLYCERIN 0.4 mg/hr  Commonly known as:  NITRODUR - Dosed in mg/24 hr  Place 1 patch onto the skin every other day.     nitroGLYCERIN 0.4 MG SL tablet  Commonly known as:  NITROSTAT  Place 0.4 mg under the tongue every 5 (five) minutes as needed. For chest pain     potassium chloride SA 20 MEQ tablet  Commonly known as:  K-DUR,KLOR-CON  Take 20 mEq by mouth 2 (two) times daily.     sitaGLIPtin 100 MG tablet  Commonly known as:  JANUVIA  Take 100 mg by mouth daily.     tiotropium 18 MCG inhalation capsule  Commonly known as:  SPIRIVA  Place 18 mcg into inhaler and inhale daily.     TORSEMIDE PO  Take 50 mg by mouth 2 (two) times daily.     Vitamin D (Ergocalciferol) 50000 UNITS Caps  Commonly known as:  DRISDOL  Take 50,000 Units by mouth 2 (two) times a week. Takes on tuesdays and fridays     warfarin 5 MG tablet  Commonly known as:  COUMADIN  Take 2.5-5 mg by mouth daily. Alternates 2.5-5mg  every day       Follow-up Information   Follow up with [facility] R, NP. (Our office scheduler will call you with the appt date and time. )    Contact information:   [address] [place] [address] [phone]       Signed: Wilburt Finlay 06/11/2013, 2:49 PM

## 2013-06-11 NOTE — Progress Notes (Addendum)
1415 discharge instructions given . Verbalized understanding   wheeled to lobby by NT @ 1500

## 2013-06-11 NOTE — Progress Notes (Signed)
Subjective: Feeling better, although she has had some calf cramping.   Objective: Vital signs in last 24 hours: Temp:  [97.4 F (36.3 C)-97.7 F (36.5 C)] 97.5 F (36.4 C) (06/12 0500) Pulse Rate:  [69-70] 70 (06/12 0500) Resp:  [18] 18 (06/12 0500) BP: (93-111)/(45-65) 93/45 mmHg (06/12 0500) SpO2:  [98 %-100 %] 98 % (06/12 0500) Weight:  [347 lb 14.4 oz (157.806 kg)] 347 lb 14.4 oz (157.806 kg) (06/12 0500) Last BM Date: 06/10/13  Intake/Output from previous day: 06/11 0701 - 06/12 0700 In: 1620 [P.O.:1620] Out: 2700 [Urine:2700] Intake/Output this shift: Total I/O In: 240 [P.O.:240] Out: -   Medications Current Facility-Administered Medications  Medication Dose Route Frequency Provider Last Rate Last Dose  . 0.9 %  sodium chloride infusion  250 mL Intravenous PRN Wilburt Finlay, PA-C      . 0.9 %  sodium chloride infusion  250 mL Intravenous Continuous Nada Boozer, NP      . 0.9 %  sodium chloride infusion  250 mL Intravenous Continuous Brittainy Halling, PA-C      . acetaminophen (TYLENOL) tablet 650 mg  650 mg Oral Q4H PRN Wilburt Finlay, PA-C   650 mg at 06/10/13 1107  . albuterol (PROVENTIL) (5 MG/ML) 0.5% nebulizer solution 2.5 mg  2.5 mg Nebulization Q6H PRN Wilburt Finlay, PA-C      . ALPRAZolam Prudy Feeler) tablet 0.25 mg  0.25 mg Oral TID PRN Wilburt Finlay, PA-C   0.25 mg at 06/10/13 2118  . amiodarone (PACERONE) tablet 300 mg  300 mg Oral BID Thurmon Fair, MD   300 mg at 06/10/13 2118  . calcitRIOL (ROCALTROL) capsule 0.25 mcg  0.25 mcg Oral BID Wilburt Finlay, PA-C   0.25 mcg at 06/10/13 2117  . digoxin (LANOXIN) tablet 0.125 mg  0.125 mg Oral Daily Wilburt Finlay, PA-C   0.125 mg at 06/10/13 1050  . doxycycline (VIBRA-TABS) tablet 100 mg  100 mg Oral BID Nada Boozer, NP   100 mg at 06/10/13 2117  . DULoxetine (CYMBALTA) DR capsule 30 mg  30 mg Oral BID PRN Wilburt Finlay, PA-C      . glyBURIDE (DIABETA) tablet 10 mg  10 mg Oral Q breakfast Nada Boozer, NP   10 mg at 06/11/13  0626  . glyBURIDE (DIABETA) tablet 5 mg  5 mg Oral Q supper Nada Boozer, NP   5 mg at 06/10/13 1705  . HYDROcodone-acetaminophen (NORCO) 10-325 MG per tablet 1 tablet  1 tablet Oral BID Wilburt Finlay, PA-C   1 tablet at 06/10/13 2118  . hydrocortisone cream 1 % 1 application  1 application Topical TID PRN Brittainy Walstad, PA-C      . insulin aspart (novoLOG) injection 0-20 Units  0-20 Units Subcutaneous TID WC Wilburt Finlay, PA-C   4 Units at 06/10/13 1229  . insulin glargine (LANTUS) injection 30 Units  30 Units Subcutaneous QHS Wilburt Finlay, PA-C   30 Units at 06/10/13 2117  . iron polysaccharides (NIFEREX) capsule 150 mg  150 mg Oral BID Wilburt Finlay, PA-C   150 mg at 06/10/13 2117  . levothyroxine (SYNTHROID, LEVOTHROID) tablet 125 mcg  125 mcg Oral QAC breakfast Wilburt Finlay, PA-C   125 mcg at 06/11/13 1610  . linagliptin (TRADJENTA) tablet 5 mg  5 mg Oral Daily Wilburt Finlay, PA-C   5 mg at 06/10/13 1053  . metoprolol tartrate (LOPRESSOR) tablet 37.5 mg  37.5 mg Oral BID Wilburt Finlay, PA-C   37.5 mg at 06/10/13 2118  . mupirocin  ointment (BACTROBAN) 2 %   Nasal BID Nada Boozer, NP      . nitroGLYCERIN (NITRODUR - Dosed in mg/24 hr) patch 0.4 mg  0.4 mg Transdermal QODAY Wilburt Finlay, PA-C      . nitroGLYCERIN (NITROSTAT) SL tablet 0.4 mg  0.4 mg Sublingual Q5 min PRN Wilburt Finlay, PA-C      . ondansetron [facility]) injection 4 mg  4 mg Intravenous Q6H PRN Wilburt Finlay, PA-C      . potassium chloride SA (K-DUR,KLOR-CON) CR tablet 20 mEq  20 mEq Oral BID Wilburt Finlay, PA-C   20 mEq at 06/10/13 2118  . sodium chloride 0.9 % injection 3 mL  3 mL Intravenous Q12H Wilburt Finlay, PA-C   3 mL at 06/10/13 1052  . sodium chloride 0.9 % injection 3 mL  3 mL Intravenous PRN Wilburt Finlay, PA-C   3 mL at 06/07/13 1046  . sodium chloride 0.9 % injection 3 mL  3 mL Intravenous Q12H Nada Boozer, NP   3 mL at 06/10/13 1052  . sodium chloride 0.9 % injection 3 mL  3 mL Intravenous PRN Nada Boozer, NP      . sodium chloride  0.9 % injection 3 mL  3 mL Intravenous Q12H Brittainy Popson, PA-C   3 mL at 06/10/13 1052  . sodium chloride 0.9 % injection 3 mL  3 mL Intravenous PRN Brittainy Rolf, PA-C      . torsemide (DEMADEX) tablet 50 mg  50 mg Oral BID Mihai Croitoru, MD   50 mg at 06/10/13 1705  . Vitamin D (Ergocalciferol) (DRISDOL) capsule 50,000 Units  50,000 Units Oral 2 times weekly Wilburt Finlay, PA-C   50,000 Units at 06/08/13 1247  . Warfarin - Pharmacist Dosing Inpatient   Does not apply q1800 Chinita Greenland, [facility]      . zolpidem (AMBIEN) tablet 10 mg  10 mg Oral QHS PRN Wilburt Finlay, PA-C   10 mg at 06/02/13 2313    PE: General appearance: alert, cooperative and no distress  Lungs: clear to auscultation bilaterally  Heart: regular rate and rhythm, S1, S2 normal, no murmur, click, rub or gallop  Extremities: Chronic edema/fat  Pulses: 2+ and symmetric  Neurologic: Grossly normal    Lab Results:  No results found for this basename: WBC, HGB, HCT, PLT,  in the last 72 hours BMET  Recent Labs  06/09/13 0502 06/10/13 0505 06/11/13 0507  NA 138 138 136  K 3.2* 3.3* 4.0  CL 95* 93* 93*  CO2 33* 33* 32  GLUCOSE 142* 147* 117*  BUN 68* 76* 83*  CREATININE 2.30* 2.61* 2.57*  CALCIUM 9.6 9.2 9.4   PT/INR  Recent Labs  06/09/13 0502 06/10/13 0505 06/11/13 0507  LABPROT 27.6* 31.0* 29.0*  INR 2.73* 3.20* 2.92*      Assessment/Plan   Principal Problem:   Acute on chronic combined systolic and diastolic HF (heart failure) Active Problems:   DIABETES MELLITUS   OBESITY, MORBID   OBSTRUCTIVE SLEEP APNEA   HYPERTENSION   HYPERTENSION, PULMONARY   PACEMAKER, PERMANENT   Obesity hypoventilation syndrome   Solitary kidney, acquired   Cardiomyopathy, nonischemic   PAF (paroxysmal atrial fibrillation), 1st episode since DCCV in Jan/14   CKD (chronic kidney disease) stage 4, GFR 15-29 ml/min   Ulcers of both lower extremities, small, now with cellulitis   Hypokalemia,  replacing  Plan:  Net fluids:  -1.0L/-17.8L(adm).  Potassium WNL now.  Keep on PO supplements.  SCr stable.  DC  home today.  TCM 7 follow up.     LOS: 9 days    [name], [name] 06/11/2013 9:52 AM   Patient seen and examined. Agree with assessment and plan. With Cr 2.57 will decrease dig to 0.0625 mg daily. K 4.0 today following repletion. Continue doxycycline for LE cellulitis.Will dc today.   Lennette Bihari, MD, [facility] 06/11/2013 11:19 AM

## 2013-06-12 ENCOUNTER — Telehealth: Payer: Self-pay | Admitting: Physician Assistant

## 2013-06-12 NOTE — Telephone Encounter (Signed)
The patient reports no complaints and is doing well.  She also reports a two pound weight gain since yesterday. Her follow up appt is 06/17/13.

## 2013-06-17 ENCOUNTER — Ambulatory Visit (INDEPENDENT_AMBULATORY_CARE_PROVIDER_SITE_OTHER): Payer: Medicare Other | Admitting: Pharmacist Clinician (PhC)/ Clinical Pharmacy Specialist

## 2013-06-17 ENCOUNTER — Ambulatory Visit: Payer: Medicare Other | Admitting: Cardiology

## 2013-06-17 ENCOUNTER — Encounter: Payer: Self-pay | Admitting: Physician Assistant

## 2013-06-17 ENCOUNTER — Ambulatory Visit (INDEPENDENT_AMBULATORY_CARE_PROVIDER_SITE_OTHER): Payer: Medicare Other | Admitting: Physician Assistant

## 2013-06-17 VITALS — BP 100/80 | HR 76 | Ht 67.0 in | Wt 349.0 lb

## 2013-06-17 DIAGNOSIS — I10 Essential (primary) hypertension: Secondary | ICD-10-CM

## 2013-06-17 DIAGNOSIS — I428 Other cardiomyopathies: Secondary | ICD-10-CM

## 2013-06-17 DIAGNOSIS — I5042 Chronic combined systolic (congestive) and diastolic (congestive) heart failure: Secondary | ICD-10-CM

## 2013-06-17 DIAGNOSIS — E119 Type 2 diabetes mellitus without complications: Secondary | ICD-10-CM

## 2013-06-17 DIAGNOSIS — E876 Hypokalemia: Secondary | ICD-10-CM

## 2013-06-17 DIAGNOSIS — Z7901 Long term (current) use of anticoagulants: Secondary | ICD-10-CM

## 2013-06-17 DIAGNOSIS — I4891 Unspecified atrial fibrillation: Secondary | ICD-10-CM

## 2013-06-17 DIAGNOSIS — I480 Paroxysmal atrial fibrillation: Secondary | ICD-10-CM

## 2013-06-17 DIAGNOSIS — Z950 Presence of cardiac pacemaker: Secondary | ICD-10-CM

## 2013-06-17 NOTE — Assessment & Plan Note (Signed)
Ejection fraction was in the range of 30% to 35%.

## 2013-06-17 NOTE — Assessment & Plan Note (Signed)
Reports currently being sick after taking glyburide and plans to see her endocrinologist.

## 2013-06-17 NOTE — Assessment & Plan Note (Signed)
Patient has been tracking her daily ins and outs. It appears that she's had fluid loss and 8 L since her discharge. She reports no shortness of breath and no orthopnea. She is extremely happy now that she is able to lie flat while sleeping.  Will continue on current torsemide dosing.  I told her that she could add a half tablet torsemide in the morning for three days if she notices a weight gain of 4-5 pounds in a week.

## 2013-06-17 NOTE — Progress Notes (Signed)
Date:  06/17/2013   ID:  [name], DOB 02-Mar-1950, MRN [id number]  PCP:  Margaree Mackintosh, MD  Primary Cardiologist:  Croitoru     History of Present Illness: [name] is a 63 y.o. 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 She is now roughly 6 months status post an elective direct current cardioversion and has successfully maintained normal rhythm. Interrogation of her pacemaker today shows underlying atrial fibrillation. The patient did under-go DCCV on 02/06/13. She was 20# heavier and complaining of SOB, orthopnea and not being able to sleep at admission. She denied N, V, fever, CP, dizziness, ABD pain, hematuria, hematochezia.   She was admitted to [facility] for IV diuretic therapy which resulted in a net diuresis of -17.8 liters. She was eventually changed to PO torsemide.  2D echo revealed an EF of 30-35% with diffuse hypokinesis. LA severely dilated. Severe MR.  RA mod to severe dilation. Mod pulm HTN. She developed cellulitis in her lower extremities and was started on doxycycline for ten days. She was cardioverted on 06/08/13.  Due to worsening kidney function digoxin was decreased to 0.0625mg .   Patient presents today for a seven-day followup she reports for the first time in a long time she is able to lie flat while sleeping. He also denies shortness of breath. She contracted her weight since her discharge and also her fluids it appears she's down another 8 L according to her records. She currently denies nausea, vomiting, fever, chest pain, shortness of breath, orthopnea, dizziness, PND, cough, congestion, abdominal pain, hematochezia, melena, dysuria, hematuria.    Wt Readings from Last 3 Encounters:  06/17/13 349 lb  (158.305 kg)  06/11/13 347 lb 14.4 oz (157.806 kg)  06/11/13 347 lb 14.4 oz (157.806 kg)     Past Medical History  Diagnosis Date  . Personal history of other diseases of circulatory system   . 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 Chronic lymphocytic thyroiditis   . Morbid obesity   . Other chronic pulmonary heart diseases   . Obstructive sleep apnea (adult) (pediatric)   . Other and unspecified hyperlipidemia   . Fibromyalgia   . Goiter   . Thyroiditis, autoimmune   . Hypothyroidism, acquired, autoimmune   . Solitary kidney, acquired   . Fatigue   . Hyperparathyroidism, secondary   . Vitamin D deficiency disease   . Hyperparathyroidism   . H/O varicose veins 03/27/12  . Type II or unspecified type diabetes mellitus without mention of complication, not stated as uncontrolled   . Diabetes mellitus type II   . Infections of kidney 03/27/12  . History of measles, mumps, or rubella 03/27/12  . Tubal pregnancy 12/23/76  . Unspecified essential hypertension     20 yrs ago  . Blood transfusion 1977    [facility]  . Complication of anesthesia 2010    hard to wake up after knee surgery  . Anemia   . Cardiomyopathy, nonischemic     EF 45 %  . Endometrial hyperplasia with atypia 05/05/12  . Diastolic HF (heart failure) 11/05/2012  . A-fib   . Echocardiogram abnormal 07/15/12    severe MR,mod to severe TR,mod to severe PA hypertension  . Pulmonary arterial hypertension   . PAF (paroxysmal atrial fibrillation),  1st episode since DCCV in Jan/14 03/19/2013  . Ulcers of both lower extremities, small, now with cellulitis 06/05/2013    Current Outpatient Prescriptions  Medication Sig Dispense Refill  . albuterol (PROVENTIL HFA;VENTOLIN HFA) 108 (90 BASE) MCG/ACT inhaler Inhale 2 puffs into the lungs every 6 (six) hours as needed. For shortness of breath      . albuterol (PROVENTIL) (2.5 MG/3ML) 0.083% nebulizer solution Take 2.5 mg by nebulization every 6 (six) hours as needed.  For shortness of breath      . amiodarone (CORDARONE) 100 MG tablet Take 3 tablets (300 mg total) by mouth 2 (two) times daily.  180 tablet  5  . calcitRIOL (ROCALTROL) 0.25 MCG capsule Take 0.25 mcg by mouth 2 (two) times daily.      . calcium citrate-vitamin D 200-200 MG-UNIT TABS Take 1 tablet by mouth 4 (four) times daily -  before meals and at bedtime.      . digoxin 62.5 MCG TABS Take 0.0625 mg by mouth daily.  30 tablet  5  . glyBURIDE (DIABETA) 5 MG tablet Take 5-10 mg by mouth 2 (two) times daily with a meal. Take 10mg  in the morning and 5mg  at night      . HYDROcodone-acetaminophen (NORCO) 10-325 MG per tablet Take 1 tablet by mouth 2 (two) times daily.      . insulin glargine (LANTUS) 100 UNIT/ML injection Inject 30 Units into the skin at bedtime.      . iron polysaccharides (NIFEREX) 150 MG capsule Take 150 mg by mouth 2 (two) times daily.      Marland Kitchen levothyroxine (SYNTHROID, LEVOTHROID) 125 MCG tablet Take 125 mcg by mouth daily before breakfast.      . metoprolol tartrate (LOPRESSOR) 25 MG tablet Take 37.5 mg by mouth 2 (two) times daily.      . Multiple Vitamin (MULTIVITAMIN) tablet Take 1 tablet by mouth daily.       . nitroGLYCERIN (NITRODUR - DOSED IN MG/24 HR) 0.4 mg/hr Place 1 patch onto the skin every other day.      . nitroGLYCERIN (NITROSTAT) 0.4 MG SL tablet Place 0.4 mg under the tongue every 5 (five) minutes as needed. For chest pain      . potassium chloride SA (K-DUR,KLOR-CON) 20 MEQ tablet Take 20 mEq by mouth 2 (two) times daily.      . sitaGLIPtin (JANUVIA) 100 MG tablet Take 100 mg by mouth daily.      Marland Kitchen tiotropium (SPIRIVA) 18 MCG inhalation capsule Place 18 mcg into inhaler and inhale daily.      . TORSEMIDE PO Take 50 mg by mouth 2 (two) times daily.      . Vitamin D, Ergocalciferol, (DRISDOL) 50000 UNITS CAPS Take 50,000 Units by mouth 2 (two) times a week. Takes on tuesdays and fridays      . warfarin (COUMADIN) 5 MG tablet Take 2.5-5 mg by mouth daily. Alternates  2.5-5mg  every day      . zolpidem (AMBIEN) 10 MG tablet Take 10 mg by mouth at bedtime as needed. sleep      . ALPRAZolam (XANAX) 0.5 MG tablet Take 0.25 mg by mouth 3 (three) times daily as needed. For anxiety      . DULoxetine (CYMBALTA) 30 MG capsule Take 30 mg by mouth 2 (two) times daily as needed. For pain       No current facility-administered medications for this visit.    Allergies:    Allergies  Allergen Reactions  . Ivp  Dye (Iodinated Diagnostic Agents) Shortness Of Breath    Turn red, can't breathe  . Betadine (Povidone Iodine)   . Diphenhydramine Hcl Swelling    In hands and eyes  . Fish Allergy Nausea And Vomiting  . Iohexol      Desc: PT TURNS RED AND WHEEZING   . Penicillins Other (See Comments)    Resp arrest as child  . Povidone Other (See Comments)    Wheezing, turn red, can't breath, and blisters  . Promethazine Hcl Other (See Comments)    Low Blood Pressure  . Tape     Blisters, can use paper tape for short periods  . Clindamycin Hives and Rash    wheezing  . Iodine Rash  . Morphine Sulfate Nausea And Vomiting and Rash    Social History:  The patient  reports that she has never smoked. She has never used smokeless tobacco. She reports that she does not drink alcohol or use illicit drugs.   Family History  Problem Relation Age of Onset  . Cardiomyopathy Brother   . Diabetes Mother   . Cancer Maternal Grandmother   . Heart attack Mother     ROS:  Please see the history of present illness.  All other systems reviewed and negative.   PHYSICAL EXAM: VS:  BP 100/80  Pulse 76  Ht 5\' 7"  (1.702 m)  Wt 349 lb (158.305 kg)  BMI 54.65 kg/m2 Morbidly obese, well developed, in no acute distress HEENT: Pupils are equal round react to light accommodation extraocular movements are intact.  Neck: no JVDNo cervical lymphadenopathy. Cardiac: Regular rate and rhythm without murmurs rubs or gallops. Pulmonary:  clear to auscultation bilaterally, no wheezing,  rhonchi or rales GI: soft, nontender, positive bowel sounds all quadrants, no hepatosplenomegaly Vascular: no apparent lower extremity edema.  2+ radial and dorsalis pedis pulses. Skin: warm and dry Neuro:  Grossly normal  EKG:     Pacing 76 beats per minute  ASSESSMENT AND PLAN:  Problem List Items Addressed This Visit   Cardiomyopathy, nonischemic (Chronic)     Ejection fraction was in the range of 30% to 35%.    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 It appears that she's had fluid loss and 8 L since her discharge. She reports no shortness of breath and no orthopnea. She is extremely happy now that she is able to lie flat while sleeping.  Will continue on current torsemide dosing.  I told her that she could add a half tablet torsemide in the morning for three days if she notices a weight gain of 4-5 pounds in a week.    DIABETES MELLITUS (Chronic)     Reports currently being sick after taking glyburide and plans to see her endocrinologist.    HYPERTENSION (Chronic)     Blood pressure well controlled at this time    Hypokalemia, replacing   OBESITY, MORBID (Chronic)     Patient is scheduled for nutritional consult.     Other Visit Diagnoses   Pacemaker    -  Primary    Relevant Orders       EKG 12-Lead

## 2013-06-17 NOTE — Assessment & Plan Note (Signed)
Blood pressure well controlled at this time. 

## 2013-06-17 NOTE — Patient Instructions (Signed)
Her weight increases 5-7 pounds and weeks time take an extra half torsemide tablet every morning for the next 3 days to monitor weight.    Followup Dr. Salena Saner. schedule

## 2013-06-17 NOTE — Assessment & Plan Note (Signed)
Patient is scheduled for nutritional consult. 

## 2013-07-03 ENCOUNTER — Other Ambulatory Visit: Payer: Self-pay | Admitting: *Deleted

## 2013-07-03 MED ORDER — INSULIN GLARGINE 100 UNIT/ML ~~LOC~~ SOLN: SUBCUTANEOUS | Status: DC

## 2013-07-15 ENCOUNTER — Other Ambulatory Visit: Payer: Self-pay

## 2013-07-15 MED ORDER — HYDROCODONE-ACETAMINOPHEN 10-325 MG PO TABS: 1.0000 | ORAL_TABLET | Freq: Two times a day (BID) | ORAL | Status: DC | PRN

## 2013-07-16 ENCOUNTER — Other Ambulatory Visit: Payer: Self-pay | Admitting: Cardiovascular Disease

## 2013-07-16 ENCOUNTER — Ambulatory Visit (INDEPENDENT_AMBULATORY_CARE_PROVIDER_SITE_OTHER): Payer: Medicare Other | Admitting: Cardiovascular Disease

## 2013-07-16 ENCOUNTER — Encounter: Payer: Self-pay | Admitting: Cardiovascular Disease

## 2013-07-16 ENCOUNTER — Ambulatory Visit (INDEPENDENT_AMBULATORY_CARE_PROVIDER_SITE_OTHER): Payer: Medicare Other | Admitting: Pharmacist Clinician (PhC)/ Clinical Pharmacy Specialist

## 2013-07-16 VITALS — BP 118/92 | HR 72 | Ht 67.0 in | Wt 337.5 lb

## 2013-07-16 DIAGNOSIS — I480 Paroxysmal atrial fibrillation: Secondary | ICD-10-CM

## 2013-07-16 DIAGNOSIS — I4819 Other persistent atrial fibrillation: Secondary | ICD-10-CM

## 2013-07-16 DIAGNOSIS — I4891 Unspecified atrial fibrillation: Secondary | ICD-10-CM

## 2013-07-16 DIAGNOSIS — N184 Chronic kidney disease, stage 4 (severe): Secondary | ICD-10-CM

## 2013-07-16 DIAGNOSIS — Z7901 Long term (current) use of anticoagulants: Secondary | ICD-10-CM

## 2013-07-16 DIAGNOSIS — I428 Other cardiomyopathies: Secondary | ICD-10-CM

## 2013-07-16 LAB — PACEMAKER DEVICE OBSERVATION

## 2013-07-16 MED ORDER — AMIODARONE HCL 100 MG PO TABS: 200.0000 mg | ORAL_TABLET | Freq: Two times a day (BID) | ORAL | Status: DC

## 2013-07-16 NOTE — Patient Instructions (Signed)
Your physician recommends that you return for lab work in: today Your physician has recommended you make the following change in your medication: reduce amiodarone to 200 mg twice daily.

## 2013-07-17 ENCOUNTER — Other Ambulatory Visit: Payer: Self-pay | Admitting: *Deleted

## 2013-07-17 ENCOUNTER — Encounter: Payer: Self-pay | Admitting: Cardiovascular Disease

## 2013-07-17 NOTE — Assessment & Plan Note (Signed)
Moderately dilated left ventricle with moderately depressed systolic function ejection fraction about 35% in the setting of moderate to severe mitral insufficiency. She is as always plagued by severe lower showed edema which is due to combination of right heart failure and peripheral venous insufficiency and obesity. She is not on ACE inhibitors due to the severity of her renal disease (creatinine around 2.6 and recently rather volatile) she is receiving beta blockers. No adjustment is made today to her diuretic regimen. She monitors her weight on a regular basis and takes an extra half tablet of torsemide for weight gain/worsening edema

## 2013-07-17 NOTE — Progress Notes (Signed)
Patient ID: [name], female   DOB: April 27, 1950, 63 y.o.   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 Ivp Dye (Iodinated Diagnostic Agents) Shortness Of Breath    Turn red, can't breathe  . Betadine (Povidone Iodine)   . Diphenhydramine Hcl Swelling    In hands and eyes  . Fish Allergy Nausea And Vomiting  . Iohexol      Desc: PT TURNS RED AND WHEEZING   . Penicillins Other (See Comments)    Resp arrest as child  . Povidone Other (See Comments)    Wheezing, turn red, can't breath, and blisters  . Promethazine Hcl Other (See Comments)    Low Blood Pressure  . Tape     Blisters, can use paper tape for short periods  . Clindamycin Hives and Rash    wheezing  . Iodine Rash  . Morphine Sulfate Nausea And Vomiting and Rash    Current Outpatient Prescriptions  Medication Sig Dispense Refill  . albuterol (PROVENTIL HFA;VENTOLIN HFA) 108 (90 BASE) MCG/ACT inhaler Inhale 2 puffs into the lungs every 6 (six) hours as needed. For shortness of breath      . albuterol (PROVENTIL) (2.5 MG/3ML) 0.083% nebulizer solution Take 2.5 mg by nebulization every 6 (six) hours as needed. For shortness of breath      . ALPRAZolam (XANAX) 0.5 MG tablet Take 0.25 mg by mouth 3 (three) times daily as needed. For anxiety      . amiodarone (PACERONE) 100 MG tablet Take 2 tablets (200 mg total) by mouth 2 (two) times daily.  180 tablet  5  . calcitRIOL (ROCALTROL) 0.25 MCG capsule Take 0.25 mcg by mouth 2 (two) times daily.      . calcium citrate-vitamin D 200-200 MG-UNIT TABS Take 1 tablet by mouth 4 (four) times daily -  before meals and at bedtime.      . digoxin 62.5 MCG TABS Take 0.0625 mg by mouth daily.  30 tablet  5  . DULoxetine (CYMBALTA) 30 MG capsule Take 30 mg by mouth 2 (two) times daily as needed. For pain      . glyBURIDE (DIABETA) 5 MG tablet  Take 5-10 mg by mouth 2 (two) times daily with a meal. Take 10mg  in the morning and 5mg  at night      . HYDROcodone-acetaminophen (NORCO) 10-325 MG per tablet Take 1 tablet by mouth 2 (two) times daily as needed for pain.  90 tablet  5  . insulin glargine (LANTUS) 100 UNIT/ML injection Use up to 50 units daily  5 pen  6  . iron polysaccharides (NIFEREX) 150 MG capsule Take 150 mg by mouth 2 (two) times daily.      Marland Kitchen levothyroxine (SYNTHROID, LEVOTHROID) 125 MCG tablet Take 125 mcg by mouth daily before breakfast.      . metoprolol tartrate (LOPRESSOR) 25 MG tablet Take 37.5 mg by mouth 2 (two) times daily.      . Multiple Vitamin (MULTIVITAMIN) tablet Take 1 tablet by mouth daily.       . nitroGLYCERIN (NITRODUR - DOSED IN MG/24 HR) 0.4 mg/hr Place 1 patch onto the skin every other day.      . nitroGLYCERIN (NITROSTAT) 0.4 MG SL tablet Place 0.4 mg under the tongue every 5 (five) minutes as needed. For chest pain      . ONE TOUCH ULTRA  TEST test strip       . potassium chloride SA (K-DUR,KLOR-CON) 20 MEQ tablet Take 20 mEq by mouth 2 (two) times daily.      . sitaGLIPtin (JANUVIA) 100 MG tablet Take 100 mg by mouth daily.      Marland Kitchen tiotropium (SPIRIVA) 18 MCG inhalation capsule Place 18 mcg into inhaler and inhale daily.      . TORSEMIDE PO Take 50 mg by mouth 2 (two) times daily.      . Vitamin D, Ergocalciferol, (DRISDOL) 50000 UNITS CAPS Take 50,000 Units by mouth 2 (two) times a week. Takes on tuesdays and fridays      . warfarin (COUMADIN) 5 MG tablet Take 2.5-5 mg by mouth daily. Alternates 2.5-5mg  every day      . zolpidem (AMBIEN) 10 MG tablet Take 10 mg by mouth at bedtime as needed. sleep       No current facility-administered medications for this visit.    Past Medical History  Diagnosis Date  . Personal history of other diseases of circulatory system   . 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 Chronic lymphocytic thyroiditis   . Morbid obesity   . Other chronic  pulmonary heart diseases   . Obstructive sleep apnea (adult) (pediatric)   . Other and unspecified hyperlipidemia   . Fibromyalgia   . Goiter   . Thyroiditis, autoimmune   . Hypothyroidism, acquired, autoimmune   . Solitary kidney, acquired   . Fatigue   . Hyperparathyroidism, secondary   . Vitamin D deficiency disease   . Hyperparathyroidism   . H/O varicose veins 03/27/12  . Type II or unspecified type diabetes mellitus without mention of complication, not stated as uncontrolled   . Diabetes mellitus type II   . Infections of kidney 03/27/12  . History of measles, mumps, or rubella 03/27/12  . Tubal pregnancy 12/23/76  . Unspecified essential hypertension     20 yrs ago  . Blood transfusion 1977    [facility]  . Complication of anesthesia 2010    hard to wake up after knee surgery  . Anemia   . Cardiomyopathy, nonischemic     EF 45 %  . Endometrial hyperplasia with atypia 05/05/12  . Diastolic HF (heart failure) 11/05/2012  . A-fib   . Echocardiogram abnormal 07/15/12    severe MR,mod to severe TR,mod to severe PA hypertension  . Pulmonary arterial hypertension   . PAF (paroxysmal atrial fibrillation), 1st episode since DCCV in Jan/14 03/19/2013  . Ulcers of both lower extremities, small, now with cellulitis 06/05/2013    Past Surgical History  Procedure Laterality Date  . Cholecystectomy    . Pacemaker placement  02/26/11    Medtronic Adapta  . Knee surgery    . Resection duplicate left kidney    . 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 Hernia repair  1999    ruptured ;  . D& c with hysteroscopy & cervical polypectomy  05/05/12  . Cardioversion N/A 02/06/2013    Successful  . Iud removal  01/30/13  . Ectopic pregnancy surgery  1978  . Cardioversion N/A 06/08/2013    Procedure: CARDIOVERSION;  Surgeon: Thurmon Fair, MD;  Location: [facility] OR;  Service: Cardiovascular;  Laterality: N/A;  Room [id number]    Family History  Problem Relation Age of Onset  . Cardiomyopathy  Brother   . Diabetes Mother   . Cancer Maternal Grandmother   . Heart attack Mother  History   Social History  . Marital Status: Married    Spouse Name: N/A    Number of Children: N/A  . Years of Education: N/A   Occupational History  . retired Scientist, physiological    Social History Main Topics  . Smoking status: Never Smoker   . Smokeless tobacco: Never Used  . Alcohol Use: No  . Drug Use: No  . Sexually Active: Yes -- Female partner(s)    Birth Control/ Protection: Post-menopausal   Other Topics Concern  . Not on file   Social History Narrative  . No narrative on file    Review of systems: She has chronic lower showed edema that waxes and wanes in severity. The ulcers on her legs seem to be healing although there is intermittent redness. The doxycycline if they seem to help. The menisci to be worse when she hangs her legs and improves once she is recumbent. She has no change in her chronic shortness of breath NYHA functional class III. She denies orthopnea pressman from dyspnea. She has not had syncope or palpitations. She denies angina, bleeding problems, focal neurological deficits, mood problems, unexpected weight changes, abdominal pain or change in bowel habits, polyuria, polydipsia frequency urgency hematuria cough hemoptysis or wheezing  PHYSICAL EXAM BP 118/92  Pulse 72  Ht 5\' 7"  (1.702 m)  Wt 337 lb 8 oz (153.089 kg)  BMI 52.85 kg/m2 General appearance: alert, cooperative, no distress and morbidly obese Neck: no adenopathy, no carotid bruit, supple, symmetrical, trachea midline, thyroid not enlarged, symmetric, no tenderness/mass/nodules and It difficult to see jugular venous pulsations due to obesity Lungs: clear to auscultation bilaterally Heart: regular rate and rhythm, S1: split, S2: wide splitting, S3 present and systolic murmur: holosystolic 2/6, blowing at apex Abdomen: soft, non-tender; bowel sounds normal; no masses,  no organomegaly Extremities: edema 3+  symmetrically bilaterally and Multiple 2-3 mm ulcers with crusts bilaterally Pulses: 2+ and symmetric Skin: Ulcers as described above, no evidence of cellulitis Neurologic: Alert and oriented X 3, normal strength and tone. Normal symmetric reflexes. Normal coordination and gait   EKG: Atrial paced ventricular paced  Lipid Panel  No results found for this basename: chol, trig, hdl, cholhdl, vldl, ldlcalc    BMET    Component Value Date/Time   NA 136 06/11/2013 0507   K 4.0 06/11/2013 0507   CL 93* 06/11/2013 0507   CO2 32 06/11/2013 0507   GLUCOSE 117* 06/11/2013 0507   BUN 83* 06/11/2013 0507   CREATININE 2.57* 06/11/2013 0507   CREATININE 1.60* 10/27/2012 0800   CREATININE 1.28* 02/12/2011 1000   CALCIUM 9.4 06/11/2013 0507   CALCIUM 9.5 03/24/2012 1212   GFRNONAA 19* 06/11/2013 0507   GFRAA 22* 06/11/2013 0507     ASSESSMENT AND PLAN Atrial fibrillation, persistent Has maintained Atrial paced rhythm without recurrence of atrial flutter relation for a month after cardioversion. This has been associated with improvement in symptoms. We will decrease the dose of amiodarone back to 400 mg a day to reduce likelihood of side effects. In the setting of severe mitral and tricuspid insufficiency and a severely dilated left atrium the likelihood of atrial fibrillation recurrence is high.  Cardiomyopathy, nonischemic Moderately dilated left ventricle with moderately depressed systolic function ejection fraction about 35% in the setting of moderate to severe mitral insufficiency. She is as always plagued by severe lower showed edema which is due to combination of right heart failure and peripheral venous insufficiency and obesity. She is not on ACE inhibitors due  to the severity of her renal disease (creatinine around 2.6 and recently rather volatile) she is receiving beta blockers. No adjustment is made today to her diuretic regimen. She monitors her weight on a regular basis and takes an extra half  tablet of torsemide for weight gain/worsening edema  Orders Placed This Encounter  Procedures  . Basic Metabolic Panel (BMET)  . Magnesium  . Protein, urine, random  . Creatinine, Urine   Meds ordered this encounter  Medications  . ONE TOUCH ULTRA TEST test strip    Sig:   . amiodarone (PACERONE) 100 MG tablet    Sig: Take 2 tablets (200 mg total) by mouth 2 (two) times daily.    Dispense:  180 tablet    Refill:  5    Order Specific Question:  Supervising Provider    Answer:  Nicki Guadalajara A [[id number]]    Junious Silk, MD, [facility] [phone] office [phone] pager

## 2013-07-17 NOTE — Assessment & Plan Note (Signed)
Has maintained Atrial paced rhythm without recurrence of atrial flutter relation for a month after cardioversion. This has been associated with improvement in symptoms. We will decrease the dose of amiodarone back to 400 mg a day to reduce likelihood of side effects. In the setting of severe mitral and tricuspid insufficiency and a severely dilated left atrium the likelihood of atrial fibrillation recurrence is high.

## 2013-07-20 LAB — BASIC METABOLIC PANEL
BUN: 53 mg/dL — ABNORMAL HIGH (ref 6–23)
Calcium: 9 mg/dL (ref 8.4–10.5)
Creat: 2.38 mg/dL — ABNORMAL HIGH (ref 0.50–1.10)
Glucose, Bld: 114 mg/dL — ABNORMAL HIGH (ref 70–99)

## 2013-07-20 LAB — MAGNESIUM: Magnesium: 2.2 mg/dL (ref 1.5–2.5)

## 2013-07-23 ENCOUNTER — Encounter (HOSPITAL_COMMUNITY): Payer: Medicare Other | Attending: Oncology

## 2013-07-23 DIAGNOSIS — D509 Iron deficiency anemia, unspecified: Secondary | ICD-10-CM

## 2013-07-23 DIAGNOSIS — D649 Anemia, unspecified: Secondary | ICD-10-CM | POA: Insufficient documentation

## 2013-07-23 LAB — CBC
HCT: 39.2 % (ref 36.0–46.0)
RBC: 4.14 MIL/uL (ref 3.87–5.11)
RDW: 18.7 % — ABNORMAL HIGH (ref 11.5–15.5)
WBC: 6.4 10*3/uL (ref 4.0–10.5)

## 2013-07-23 LAB — FERRITIN: Ferritin: 68 ng/mL (ref 10–291)

## 2013-07-23 LAB — IRON AND TIBC: TIBC: 411 ug/dL (ref 250–470)

## 2013-07-23 NOTE — Progress Notes (Signed)
Labs drawn today for cbc/diff,ferr,Iron and IBC 

## 2013-07-24 ENCOUNTER — Other Ambulatory Visit (HOSPITAL_COMMUNITY): Payer: Self-pay | Admitting: Oncology

## 2013-07-24 DIAGNOSIS — D509 Iron deficiency anemia, unspecified: Secondary | ICD-10-CM

## 2013-07-24 LAB — PACEMAKER DEVICE OBSERVATION
AL IMPEDENCE PM: 335 Ohm
AL THRESHOLD: 1.5 V
ATRIAL PACING PM: 100
BAMS-0001: 150 {beats}/min
RV LEAD IMPEDENCE PM: 360 Ohm
RV LEAD THRESHOLD: 1.5 V

## 2013-07-31 ENCOUNTER — Ambulatory Visit (HOSPITAL_COMMUNITY): Payer: Medicare Other

## 2013-07-31 ENCOUNTER — Other Ambulatory Visit: Payer: Self-pay | Admitting: *Deleted

## 2013-07-31 DIAGNOSIS — E038 Other specified hypothyroidism: Secondary | ICD-10-CM

## 2013-08-04 ENCOUNTER — Encounter (HOSPITAL_COMMUNITY): Payer: Medicare Other | Attending: Oncology

## 2013-08-04 VITALS — BP 111/61 | HR 71 | Temp 98.4°F | Resp 24

## 2013-08-04 DIAGNOSIS — D509 Iron deficiency anemia, unspecified: Secondary | ICD-10-CM | POA: Insufficient documentation

## 2013-08-04 MED ORDER — SODIUM CHLORIDE 0.9 % IV SOLN
1020.0000 mg | Freq: Once | INTRAVENOUS | Status: AC
  Administered 2013-08-04: 1020 mg via INTRAVENOUS
  Filled 2013-08-04: qty 34

## 2013-08-04 NOTE — Progress Notes (Signed)
Tolerated fereheme 1020 mg well.  IV in right arm near ac.

## 2013-08-11 ENCOUNTER — Other Ambulatory Visit: Payer: Self-pay | Admitting: *Deleted

## 2013-08-11 MED ORDER — INSULIN PEN NEEDLE 31G X 5 MM MISC: Status: AC

## 2013-08-18 ENCOUNTER — Ambulatory Visit: Payer: Medicare Other | Admitting: Pharmacist Clinician (PhC)/ Clinical Pharmacy Specialist

## 2013-08-21 LAB — T3, FREE: T3, Free: 2.1 pg/mL — ABNORMAL LOW (ref 2.3–4.2)

## 2013-08-27 ENCOUNTER — Ambulatory Visit (INDEPENDENT_AMBULATORY_CARE_PROVIDER_SITE_OTHER): Payer: Medicare Other | Admitting: "Endocrinology

## 2013-08-27 VITALS — BP 110/70 | HR 70 | Wt 324.8 lb

## 2013-08-27 DIAGNOSIS — I4891 Unspecified atrial fibrillation: Secondary | ICD-10-CM

## 2013-08-27 DIAGNOSIS — R609 Edema, unspecified: Secondary | ICD-10-CM

## 2013-08-27 DIAGNOSIS — E1169 Type 2 diabetes mellitus with other specified complication: Secondary | ICD-10-CM

## 2013-08-27 DIAGNOSIS — R600 Localized edema: Secondary | ICD-10-CM

## 2013-08-27 DIAGNOSIS — I10 Essential (primary) hypertension: Secondary | ICD-10-CM

## 2013-08-27 DIAGNOSIS — E1142 Type 2 diabetes mellitus with diabetic polyneuropathy: Secondary | ICD-10-CM

## 2013-08-27 DIAGNOSIS — N2581 Secondary hyperparathyroidism of renal origin: Secondary | ICD-10-CM

## 2013-08-27 DIAGNOSIS — E11649 Type 2 diabetes mellitus with hypoglycemia without coma: Secondary | ICD-10-CM

## 2013-08-27 DIAGNOSIS — E1149 Type 2 diabetes mellitus with other diabetic neurological complication: Secondary | ICD-10-CM

## 2013-08-27 LAB — GLUCOSE, POCT (MANUAL RESULT ENTRY): POC Glucose: 96 mg/dl (ref 70–99)

## 2013-08-27 NOTE — Progress Notes (Signed)
Subjective:  Patient Name: [name] Date of Birth: 03-19-1950  MRN: [id number]  [name]  presents to the office today for follow-up of her insulin-requiring T2DM,obesity, leg edema, left shin ulcer, secondary hyperparathyroidism, hypertension, thyroiditis, hypothyroidism, and edema, in the setting of chronic, recurrent atrial fibrillation and chronic kidney disease.  HISTORY OF PRESENT ILLNESS:   [name] is a 63 y.o. Caucasian woman. [name] was unaccompanied.  1.The patient was first referred to me on 10/30/05 by her primary care provider, Dr. Sharlet [name], for evaluation and co-management of her diabetes and obesity. I have followed her for the above problems since then. Please see my clinic note from 06/06/11 for detailed description of the course of her type 2 diabetes mellitus, hypothyroidism secondary to Hashimoto's thyroiditis, and hyperparathyroidism secondary to deficiency of calcium intake, vitamin D intake, and renal conversion of 25-hydroxy vitamin D to 1, 25-dihydroxy vitamin D.   2. At her clinic visit on 03/24/12 she had a left shin ulcer that was open and draining. Her HbA1c was 8.0%. Her oral anti-hyperglycemic medications, Januvia, 100 mg/day, and glyburide, 10 mg twice a day, were not adequately controlling her BGs. I finally convinced her to add Lantus insulin, at a dose of 4 units each evening. I also coordinated with Dr. Lenord [name] and started the patient on topical Bactroban and oral doxycycline. Her ulcer subsequently healed, but she has had several additional leg ulcers since then. We have gradually but progressively increased her Lantus dose since then and her BGs have improved substantially.   3. The patient's last [facility] visit was on 05/26/13.   A. On 05/28/13 she had an iv iron transfusion. On 06/02/13 she was admitted with bilateral CHF and atrial fibrillation. During the 10 day admission she also became hypokalemic. Her creatinine increased to 2.59. She  had a cardioversion. Her amiodarone was decreased to 200 mg, bid.   B. She had another iv iron transfusion on 08/04/13, without problems.  C. She saw Dr [name] recently. Dr. Eliott [name] offered her dialysis. [name] did not want to think about it at this time. Dr. Eliott [name] put her on a course of doxycycline to treat her leg lesions.   D. She is now taking 30 units of Lantus each evening (0.20 units/kg/day). She also takes glyburide, 10 mg at breakfast and 5 mg at supper and Januvia, 100 each AM.      E. She has been frequently waking up with a headache and has symptomatic hypoglycemia at those times..   F. She developed a severe skin reaction to betadine in the hospital.  G. Her Medicare Advantage plan with [facility] has recently stopped covering glyburide. Unfortunately, she has failed treatment in the past with the two "preferred" alternatives, glipizide and glimepiride. That plan will also no longer cover Lantus insulin, but will cover Levemir. Unfortunately, since she is on a relatively low dose of Lantus, a comparable dose of Levemir would not be a 24-hour drug. At doses of 0.20 units/kg/day, Levemir is only about a 10-12 hour drug, which is why when the FDA originally approved Levemir, they required that it be used as a twice daily drug for T1DMs who usually take less than 0.8 units/kg/day. Levemir was approved for use in T2DM patients on either a once daily or twice daily dose depending upon the patients' weight and insulin resistance. Patients must be on doses of 0.8 units/kg/day or higher in order to obtain a 24-hour basal effectiveness profile.   H. Her balance  problems improved after reducing her amiodarone dose.   4. Pertinent Review of Systems:  Constitutional: The patient generally feels better. She is able to walk again. Her energy has recently bee better. She sleeps well with her CPAP and oxygen at 2 liters. Food often does not taste good, so she has not been eating as much.  Eyes: Vision  is not as good as it was. She needs to have well-lit environments. Her last styes were in the hospital. There are no other significant eye complaints. She has a FU appointment with Dr. Luciana [name]. Neck: The patient has had 2 bouts of thyroiditis. She has been more hoarse recently, possibly due to her allergies acting up.   Heart: She still has some noticeable A-fib at times, but less frequently. Her pacemaker is still working. She still has chest pains when her HR is too high.  Lungs: She has not had any intermittent wheezing recently.  Gastrointestinal: She has not had any further nausea.  Bowel movents seem normal. She has not had much abdominal pain recently.   Legs: Muscle mass and strength seem normal. There are no complaints of numbness, tingling, burning, or pain. Her previous puncture wounds are continuing to heal. Edema is better.   Feet: There are no obvious foot problems, except swelling. The swelling is better. There are no complaints of numbness or pain. She does have tingling and burning when the feet are more swollen. Feet are also colder then.  GYN: No menses Hypoglycemia: some of her low BGs occur in the middle of the night or in the mornings. Some episodes occur in the afternoons. Lowest documented BG was 96. When her glucose levels drop, she has the expected adrenaline release. So by the time she is able to check her BGs the liver has released enough glucose so that the BG is not really low.    5. BG printout: Her average BG was 119, compared with 138 at last visit. Her BG range is 96-167, compared with 90-227 at last visit.   PAST MEDICAL, FAMILY, AND SOCIAL HISTORY:  Past Medical History  Diagnosis Date  . Personal history of other diseases of circulatory system   . 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 Chronic lymphocytic thyroiditis   . Morbid obesity   . Other chronic pulmonary heart diseases   . Obstructive sleep apnea (adult) (pediatric)   . Other and  unspecified hyperlipidemia   . Fibromyalgia   . Goiter   . Thyroiditis, autoimmune   . Hypothyroidism, acquired, autoimmune   . Solitary kidney, acquired   . Fatigue   . Hyperparathyroidism, secondary   . Vitamin D deficiency disease   . Hyperparathyroidism   . H/O varicose veins 03/27/12  . Type II or unspecified type diabetes mellitus without mention of complication, not stated as uncontrolled   . Diabetes mellitus type II   . Infections of kidney 03/27/12  . History of measles, mumps, or rubella 03/27/12  . Tubal pregnancy 12/23/76  . Unspecified essential hypertension     20 yrs ago  . Blood transfusion 1977    [facility]  . Complication of anesthesia 2010    hard to wake up after knee surgery  . Anemia   . Cardiomyopathy, nonischemic     EF 45 %  . Endometrial hyperplasia with atypia 05/05/12  . Diastolic HF (heart failure) 11/05/2012  . A-fib   . Echocardiogram abnormal 07/15/12    severe MR,mod to severe TR,mod to  severe PA hypertension  . Pulmonary arterial hypertension   . PAF (paroxysmal atrial fibrillation), 1st episode since DCCV in Jan/14 03/19/2013  . Ulcers of both lower extremities, small, now with cellulitis 06/05/2013    Family History  Problem Relation Age of Onset  . Cardiomyopathy Brother   . Diabetes Mother   . Cancer Maternal Grandmother   . Heart attack Mother     Current outpatient prescriptions:albuterol (PROVENTIL HFA;VENTOLIN HFA) 108 (90 BASE) MCG/ACT inhaler, Inhale 2 puffs into the lungs every 6 (six) hours as needed. For shortness of breath, Disp: , Rfl: ;  albuterol (PROVENTIL) (2.5 MG/3ML) 0.083% nebulizer solution, Take 2.5 mg by nebulization every 6 (six) hours as needed. For shortness of breath, Disp: , Rfl:  amiodarone (PACERONE) 100 MG tablet, Take 2 tablets (200 mg total) by mouth 2 (two) times daily., Disp: 180 tablet, Rfl: 5;  calcitRIOL (ROCALTROL) 0.25 MCG capsule, Take 0.25 mcg by mouth 2 (two) times daily., Disp: , Rfl: ;  calcium  citrate-vitamin D 200-200 MG-UNIT TABS, Take 1 tablet by mouth 4 (four) times daily -  before meals and at bedtime., Disp: , Rfl: ;  digoxin 62.5 MCG TABS, Take 0.0625 mg by mouth daily., Disp: 30 tablet, Rfl: 5 glyBURIDE (DIABETA) 5 MG tablet, Take 5-10 mg by mouth 2 (two) times daily with a meal. Take 10mg  in the morning and 5mg  at night, Disp: , Rfl: ;  insulin glargine (LANTUS) 100 UNIT/ML injection, Use up to 50 units daily, Disp: 5 pen, Rfl: 6;  Insulin Pen Needle (B-D UF III MINI PEN NEEDLES) 31G X 5 MM MISC, Check blood sugar up to 6x daily, Disp: 200 each, Rfl: 3 levothyroxine (SYNTHROID, LEVOTHROID) 125 MCG tablet, Take 125 mcg by mouth daily before breakfast., Disp: , Rfl: ;  metoprolol tartrate (LOPRESSOR) 25 MG tablet, Take 37.5 mg by mouth 2 (two) times daily., Disp: , Rfl: ;  ONE TOUCH ULTRA TEST test strip, , Disp: , Rfl: ;  potassium chloride SA (K-DUR,KLOR-CON) 20 MEQ tablet, Take 20 mEq by mouth 2 (two) times daily., Disp: , Rfl:  sitaGLIPtin (JANUVIA) 100 MG tablet, Take 100 mg by mouth daily., Disp: , Rfl: ;  tiotropium (SPIRIVA) 18 MCG inhalation capsule, Place 18 mcg into inhaler and inhale daily., Disp: , Rfl: ;  TORSEMIDE PO, Take 50 mg by mouth 2 (two) times daily., Disp: , Rfl: ;  Vitamin D, Ergocalciferol, (DRISDOL) 50000 UNITS CAPS, Take 50,000 Units by mouth 2 (two) times a week. Takes on tuesdays and fridays, Disp: , Rfl:  warfarin (COUMADIN) 5 MG tablet, Take 2.5-5 mg by mouth daily. Alternates 2.5-5mg  every day, Disp: , Rfl: ;  ALPRAZolam (XANAX) 0.5 MG tablet, Take 0.25 mg by mouth 3 (three) times daily as needed. For anxiety, Disp: , Rfl: ;  DULoxetine (CYMBALTA) 30 MG capsule, Take 30 mg by mouth 2 (two) times daily as needed. For pain, Disp: , Rfl:  HYDROcodone-acetaminophen (NORCO) 10-325 MG per tablet, Take 1 tablet by mouth 2 (two) times daily as needed for pain., Disp: 90 tablet, Rfl: 5;  iron polysaccharides (NIFEREX) 150 MG capsule, Take 150 mg by mouth 2 (two) times  daily., Disp: , Rfl: ;  Multiple Vitamin (MULTIVITAMIN) tablet, Take 1 tablet by mouth daily. , Disp: , Rfl:  nitroGLYCERIN (NITRODUR - DOSED IN MG/24 HR) 0.4 mg/hr, Place 1 patch onto the skin every other day., Disp: , Rfl: ;  nitroGLYCERIN (NITROSTAT) 0.4 MG SL tablet, Place 0.4 mg under the tongue every 5 (five)  minutes as needed. For chest pain, Disp: , Rfl: ;  zolpidem (AMBIEN) 10 MG tablet, Take 10 mg by mouth at bedtime as needed. sleep, Disp: , Rfl:   Allergies as of 08/27/2013 - Review Complete 08/27/2013  Allergen Reaction Noted  . Ivp dye [iodinated diagnostic agents] Shortness Of Breath 05/19/2012  . Betadine [povidone iodine]  04/06/2013  . Diphenhydramine hcl Swelling   . Fish allergy Nausea And Vomiting 05/29/2011  . Iohexol  07/19/2005  . Penicillins Other (See Comments)   . Povidone Other (See Comments)   . Promethazine hcl Other (See Comments)   . Tape  08/06/2011  . Clindamycin Hives and Rash 05/17/2008  . Iodine Rash 05/21/2012  . Morphine sulfate Nausea And Vomiting and Rash     1. Work and Family: She spends a lot of her time back and forth to her many physicians. 2. Activities: Same 3. Smoking, alcohol, or drugs: none 4. Primary Care Provider: Margaree Mackintosh, MD 5. Cardiologist: Dr. Rachelle Hora Croitoru 6. GYN: Dr. Osborn Coho 7. Oncologist: Dr. Glenford Peers retired. She will have a new oncologist soon at  the [facility] 8. Nephrology: Camille Bal  REVIEW OF SYSTEMS: There are no other significant problems involving Caileen's other body systems.   Objective:  Vital Signs:  BP 110/70  Pulse 70  Wt 324 lb 12.8 oz (147.328 kg)  BMI 50.86 kg/m2   Ht Readings from Last 3 Encounters:  07/16/13 5\' 7"  (1.702 m)  06/17/13 5\' 7"  (1.702 m)  06/02/13 5\' 7"  (1.702 m)   Wt Readings from Last 3 Encounters:  08/27/13 324 lb 12.8 oz (147.328 kg)  07/16/13 337 lb 8 oz (153.089 kg)  06/17/13 349 lb (158.305 kg)    There is no height on file to  calculate BSA.  PHYSICAL EXAM:  Constitutional: The patient appears healthy and well nourished. She is very alert and bright today. She looks much better than she did at last visit. She has lost 15 pounds since her visit in February.  Face: The face appears normal.  Eyes: There is no obvious arcus or proptosis. Moisture appears normal. Mouth: The oropharynx appears normal. Her mouth and tongue are somewhat dry. Neck: The neck is visibly normal. No carotid bruits are noted. The thyroid gland is 18-20 grams in size. The consistency of the thyroid gland is normal. The thyroid gland is tender to palpation today bilaterally, much more on the right than on the left.  Lungs: The lungs are clear to auscultation. Air movement is good. Heart: Heart rate and rhythm are regular. Heart sounds S1 and S2 are normal.   Abdomen: The abdomen is quite enlarged. Bowel sounds are normal. There is no obvious hepatomegaly, splenomegaly, or other mass effect. She has a healing superficial ulcer of the left mid-abdomen. Arms: Muscle size and bulk are normal for age. Hands: She has a 1+ tremor today. Phalangeal and metacarpophalangeal joints are normal. Palmar muscles are normal. Palmar skin is normal. Palmar moisture is also normal. Legs: Muscles appear normal for age. Leg edema is 2-3+. Lesions are healing. Feet: Feet are normally formed. DP pulses are faint 1+. She has 1-2+ pitting edema of the dorsa and toes.  Neurologic: Strength is normal for age in the arms, but decreased in the hip flexors. Sensation to touch is normal in both legs, but decreased in both heels.  LAB DATA:  Results for orders placed in visit on 08/27/13 (from the past 504 hour(s))  GLUCOSE, POCT (MANUAL RESULT ENTRY)  Collection Time    08/27/13  2:25 PM      Result Value Range   POC Glucose 96  70 - 99 mg/dl  POCT GLYCOSYLATED HEMOGLOBIN (HGB A1C)   Collection Time    08/27/13  2:25 PM      Result Value Range   Hemoglobin A1C 6.2     Results for orders placed in visit on 07/31/13 (from the past 504 hour(s))  T3, FREE   Collection Time    08/21/13  4:28 PM      Result Value Range   T3, Free 2.1 (*) 2.3 - 4.2 pg/mL  T4, FREE   Collection Time    08/21/13  4:28 PM      Result Value Range   Free T4 1.81 (*) 0.80 - 1.80 ng/dL  TSH   Collection Time    08/21/13  4:28 PM      Result Value Range   TSH 1.703  0.350 - 4.500 uIU/mL  Her HbA1c is 6.2% today, compared with 6.9% at last visit and with 8.0% at the visit prior. The lower A1c reflects the lower BGs that she has had during the past 2-3 months when she has been sick, when food has not tasted very good, and when she has not been eating as much. Labs 08/21/13: TSH 1.703, free T4 1.81, free T3 2.1  Labs 08/13/13: 25-hydroxy vitamin d 52.8 (30-100), PTH 86 (15-65), urinry protein/creatinine ratio 256 ( <200) Labs 07/23/13: Hgb 12.8, Hct 39.2% Labs 07/20/13: BMP with calcium 9.0 Labs 05/05/13: TSH 1.791, free T4 1.86, free T3 2.3 on her current Synthroid dose of 125 mcg/day.  Labs: 10/27/12: TSH 3.745, free T4 2.12, free T3 2.6; CMP: Cr 1.4; urinary microalbumin/creatinine concentration 89.6   Assessment and Plan:   ASSESSMENT:  1-2. Insulin-requiring T2DM/hypoglycemia: The patient's HbA1c and BGs are too low. Due to her renal insufficiency, she may not be clearing insulin as rapidly as in the past. She has also been eating less and purposely consuming fewer carbohydrates. She does not need as much insulin now as she has had in the past. It is also not safe for her to be having this amount of hypoglycemia. Given her known problems with cardiac arrhythmias, she is at even greater risk of fatal arrhythmias if she develops profound hypoglycemia. For [name], a HbA1c goal of 7.0-7.5% is a much healthier goal.     3. A-fib: The arrhythmia comes and goes of its own volition. She appears to be in sinus rhythm today.   4. Leg edema: Edema is better today, but still severe.  5.  Hypertension: BPs are well within the normal range today.  6. Abdominal pains: Her abdominal pains have resolved.   7. Iron deficiency: She will continue to be followed by hematology-oncology.  8. Hypothyroidism/Thyroiditis: Her thyroiditis is again active clinically today. Her TFTs this month were again very normal. It is hard to know how much of her thyroiditis is due to Hashimoto's disease and how much, if any, is due to amiodarone.  We need to repeat her TFTs every 6 months.. 9. Fatigue: Her fatigue is better, but is still a problem for her. Although she has had problems with anemia due to iron deficiency in the past, her labs in July showed that she was replete with iron, which may account for the improvement in her fatigue. She has also remained euthyroid. The cause of her residual fatigue is unclear. 10. Diabetic peripheral neuropathy: Her sensation is a bit better  today, refecting her lower BGs in the past 6 months.   11. Secondary hyperparathyroidism: Her hyperparathyroidism is likely due to a combination of her CKD and a low calcium intake. Given her levels of PTH and 25-hydroxy vitamin D, and given the fact that she is taking calcitriol twice a day, I would expect her serum calcium to be in the upper half of the normal range. Since her serum calcium is only 9.0, it appears that her intake of calcium is deficient. She needs to increase her calcium intake.  PLAN:  1. Diagnostic: Annual surveillance labs prior to next visit.  Call on the second Wednesday evening in September to discuss BGs. 2. Therapeutic:   A. Watch salt intake tightly.   B. Reduce Lantus dose to 27 units. Continue glyburide and Januvia at their current doses.  Follow the Very Small scale HS snack plan.  Will consider reducing the Lantus doses if she loses more weight. Increase calcium intake. 3. Patient education: Until she can lose significant amounts of weight, she will need to continue the insulin and oral agent therapies.   4. Follow-up: 3 months  Level of Service: This visit lasted in excess of 60 minutes. More than 50% of the visit was devoted to counseling.  David Stall, MD 08/27/2013 2:37 PM

## 2013-08-27 NOTE — Patient Instructions (Signed)
Follow up visit in 3 months. Please reduce the Lantus dose to 27 units. Please call Dr. Fransico Babara [place] on the 2nd Wednesday in September between 8-10 PM to discuss BGs.

## 2013-08-28 ENCOUNTER — Telehealth: Payer: Self-pay | Admitting: "Endocrinology

## 2013-08-28 ENCOUNTER — Encounter: Payer: Self-pay | Admitting: "Endocrinology

## 2013-08-28 NOTE — Telephone Encounter (Signed)
1. Patient is currently taking calcium citrate with vitamin D (20 mg/200 IU)  at meals and at bedtime. She sometimes forgets the bedtime dose.  2. According to her recent labs she needs a bit more calcium  3. Change the calcium citrate with vitamin D to two tablets at lunch and two at supper. Add one Tums at supper as well. David Stall

## 2013-09-08 ENCOUNTER — Ambulatory Visit (INDEPENDENT_AMBULATORY_CARE_PROVIDER_SITE_OTHER): Payer: Medicare Other | Admitting: Pharmacist Clinician (PhC)/ Clinical Pharmacy Specialist

## 2013-09-08 DIAGNOSIS — I480 Paroxysmal atrial fibrillation: Secondary | ICD-10-CM

## 2013-09-08 DIAGNOSIS — Z7901 Long term (current) use of anticoagulants: Secondary | ICD-10-CM

## 2013-09-08 DIAGNOSIS — I4891 Unspecified atrial fibrillation: Secondary | ICD-10-CM

## 2013-09-09 ENCOUNTER — Telehealth: Payer: Self-pay | Admitting: "Endocrinology

## 2013-09-09 NOTE — Telephone Encounter (Signed)
Received telephone call from patient. 1. Overall status: 2. New problems: BGs rose yesterday. She felt achy all over and had the shakes..She woke up with stomach aches and nausea. GI symptoms resolved during the day. She has been having soup and broth today. 3. Lantus dose: 27 units 4. Glyburide, 10 mg in AM and 5 mg at supper; Januvia 100 mg in AM 5. BG log: 2 AM, Breakfast, Lunch, Supper, Bedtime 09/07/13: xxx, 115, 134, xxx, 142 09/08/13: xxx, 104, 122, xxx, 190 09/09/13: 210, 160, 142, 139 6. Assessment: Viral illness is causing increased resistance to insulin.  7. Plan: Continue the plan. Wait out the illness. 8. FU call: 4 weeks BRENNAN,MICHAEL J

## 2013-09-16 ENCOUNTER — Ambulatory Visit: Payer: Medicare Other | Admitting: Pediatric Endocrinology

## 2013-09-20 ENCOUNTER — Other Ambulatory Visit (HOSPITAL_COMMUNITY): Payer: Self-pay | Admitting: Hematology and Oncology

## 2013-09-20 DIAGNOSIS — D649 Anemia, unspecified: Secondary | ICD-10-CM | POA: Insufficient documentation

## 2013-09-21 ENCOUNTER — Other Ambulatory Visit: Payer: Self-pay | Admitting: *Deleted

## 2013-09-21 ENCOUNTER — Encounter (HOSPITAL_COMMUNITY): Payer: Medicare Other | Attending: Oncology

## 2013-09-21 DIAGNOSIS — D472 Monoclonal gammopathy: Secondary | ICD-10-CM

## 2013-09-21 DIAGNOSIS — D649 Anemia, unspecified: Secondary | ICD-10-CM | POA: Insufficient documentation

## 2013-09-21 DIAGNOSIS — D509 Iron deficiency anemia, unspecified: Secondary | ICD-10-CM | POA: Insufficient documentation

## 2013-09-21 LAB — CBC WITH DIFFERENTIAL/PLATELET
Eosinophils Absolute: 0 10*3/uL (ref 0.0–0.7)
Eosinophils Relative: 0 % (ref 0–5)
Hemoglobin: 11.8 g/dL — ABNORMAL LOW (ref 12.0–15.0)
Lymphs Abs: 0.9 10*3/uL (ref 0.7–4.0)
MCH: 31.6 pg (ref 26.0–34.0)
MCHC: 32.7 g/dL (ref 30.0–36.0)
MCV: 96.8 fL (ref 78.0–100.0)
Monocytes Relative: 5 % (ref 3–12)
Platelets: 166 10*3/uL (ref 150–400)
RBC: 3.73 MIL/uL — ABNORMAL LOW (ref 3.87–5.11)

## 2013-09-21 LAB — COMPREHENSIVE METABOLIC PANEL
BUN: 56 mg/dL — ABNORMAL HIGH (ref 6–23)
Calcium: 9.4 mg/dL (ref 8.4–10.5)
GFR calc Af Amer: 26 mL/min — ABNORMAL LOW (ref >90)
Glucose, Bld: 150 mg/dL — ABNORMAL HIGH (ref 70–99)
Total Protein: 8 g/dL (ref 6.0–8.3)

## 2013-09-21 LAB — FERRITIN: Ferritin: 303 ng/mL — ABNORMAL HIGH (ref 10–291)

## 2013-09-21 NOTE — Progress Notes (Signed)
Labs drawn today for cbc/diff,epo,ferr,cmp,mm panel,kllc

## 2013-09-22 ENCOUNTER — Telehealth: Payer: Self-pay | Admitting: Cardiovascular Disease

## 2013-09-22 DIAGNOSIS — Z79899 Other long term (current) drug therapy: Secondary | ICD-10-CM

## 2013-09-22 LAB — KAPPA/LAMBDA LIGHT CHAINS
Kappa free light chain: 7.64 mg/dL — ABNORMAL HIGH (ref 0.33–1.94)
Kappa, lambda light chain ratio: 1.22 (ref 0.26–1.65)

## 2013-09-22 LAB — ERYTHROPOIETIN: Erythropoietin: 42.8 m[IU]/mL — ABNORMAL HIGH (ref 2.6–18.5)

## 2013-09-22 NOTE — Telephone Encounter (Signed)
Returned call.  No answer/voicemail.  Message forwarded to Dr. Royann Shivers to review and advise.  Labs in [place].  Will notify pt when response given.

## 2013-09-22 NOTE — Telephone Encounter (Signed)
Needs to talk about her dosage of Potassium.  Was at the cancer center yesterday and was told to increase dosage for one month due to her lab results.  Please advise.

## 2013-09-22 NOTE — Telephone Encounter (Signed)
Agree with recommendation to increase K, but we need to recheck BMEt in 7-10 days, please.

## 2013-09-23 ENCOUNTER — Other Ambulatory Visit: Payer: Self-pay | Admitting: Pharmacist Clinician (PhC)/ Clinical Pharmacy Specialist

## 2013-09-23 LAB — MULTIPLE MYELOMA PANEL, SERUM
Albumin ELP: 44.4 % — ABNORMAL LOW (ref 55.8–66.1)
Alpha-1-Globulin: 7.2 % — ABNORMAL HIGH (ref 2.9–4.9)
Alpha-2-Globulin: 9.4 % (ref 7.1–11.8)
IgA: 889 mg/dL — ABNORMAL HIGH (ref 69–380)
IgG (Immunoglobin G), Serum: 1500 mg/dL (ref 690–1700)
Total Protein: 7.5 g/dL (ref 6.0–8.3)

## 2013-09-23 MED ORDER — COUMADIN 5 MG PO TABS: 5.0000 mg | ORAL_TABLET | Freq: Every day | ORAL | Status: DC

## 2013-09-23 MED ORDER — WARFARIN SODIUM 5 MG PO TABS: ORAL_TABLET | ORAL | Status: DC

## 2013-09-24 NOTE — Telephone Encounter (Signed)
Per Dr. Salena Saner. Increase K+ per cancer center and get BMP checked 10/05/13.  Order placed and faxed to [facility] location.  Patient voiced understanding.

## 2013-09-24 NOTE — Telephone Encounter (Signed)
Per Dr. Salena Saner. Increase K+ per cancer center and check BMP after 7-10 days.  Will go to [facility] on [facility] 10/05/13.  Order placed and faxed.

## 2013-09-29 ENCOUNTER — Encounter (HOSPITAL_BASED_OUTPATIENT_CLINIC_OR_DEPARTMENT_OTHER): Payer: Medicare Other

## 2013-09-29 ENCOUNTER — Encounter (HOSPITAL_COMMUNITY): Payer: Self-pay

## 2013-09-29 ENCOUNTER — Ambulatory Visit (HOSPITAL_COMMUNITY): Payer: Medicare Other

## 2013-09-29 VITALS — BP 110/74 | HR 75 | Temp 97.2°F | Resp 18 | Wt 322.5 lb

## 2013-09-29 DIAGNOSIS — D509 Iron deficiency anemia, unspecified: Secondary | ICD-10-CM

## 2013-09-29 DIAGNOSIS — D649 Anemia, unspecified: Secondary | ICD-10-CM

## 2013-09-29 DIAGNOSIS — E662 Morbid (severe) obesity with alveolar hypoventilation: Secondary | ICD-10-CM

## 2013-09-29 DIAGNOSIS — E063 Autoimmune thyroiditis: Secondary | ICD-10-CM

## 2013-09-29 DIAGNOSIS — I509 Heart failure, unspecified: Secondary | ICD-10-CM

## 2013-09-29 DIAGNOSIS — E8809 Other disorders of plasma-protein metabolism, not elsewhere classified: Secondary | ICD-10-CM

## 2013-09-29 NOTE — Patient Instructions (Addendum)
.  [facility] Discharge Instructions  RECOMMENDATIONS MADE BY THE CONSULTANT AND ANY TEST RESULTS WILL BE SENT TO YOUR REFERRING PHYSICIAN.  EXAM FINDINGS BY THE PHYSICIAN TODAY AND SIGNS OR SYMPTOMS TO REPORT TO CLINIC OR PRIMARY PHYSICIAN: Exam and findings as discussed by Dr. Zigmund Daniel.   INSTRUCTIONS/FOLLOW-UP: Labs in 4 months then to see the Dr.  Danae Chen you for choosing [facility] to provide your oncology and hematology care.  To afford each patient quality time with our providers, please arrive at least 15 minutes before your scheduled appointment time.  With your help, our goal is to use those 15 minutes to complete the necessary work-up to ensure our physicians have the information they need to help with your evaluation and healthcare recommendations.    Effective January 1st, 2014, we ask that you re-schedule your appointment with our physicians should you arrive 10 or more minutes late for your appointment.  We strive to give you quality time with our providers, and arriving late affects you and other patients whose appointments are after yours.    Again, thank you for choosing [facility]  Our hope is that these requests will decrease the amount of time that you wait before being seen by our physicians.       _____________________________________________________________  Should you have questions after your visit to [facility], please contact our office at [phone] between the hours of 8:30 a.m. and 5:00 p.m.  Voicemails left after 4:30 p.m. will not be returned until the following business day.  For prescription refill requests, have your pharmacy contact our office with your prescription refill request.

## 2013-09-29 NOTE — Progress Notes (Signed)
[name] OFFICE PROGRESS NOTE  Cynthia Mackintosh, MD [address] [place] [address]  DIAGNOSIS: Anemia  Obesity hypoventilation syndrome  Hypothyroidism, acquired, autoimmune  Chief Complaint  Patient presents with  . Anemia    iron deficiency IV iron5/29,08/04/2013    CURRENT THERAPY: Intravenous Feraheme 05/28/2013, 08/04/2013.  INTERVAL HISTORY: [name] 63 y.o. female returns for followup of iron deficiency in the setting of polyclonal gammopathy She continues to be monitored for warfarin to do atrial fibrillation and cardiomyopathy with recent ejection fraction of approximately 35%. She denies any epistaxis, melena, hematochezia, hematuria, vaginal bleeding, or hemoptysis. She does admit to occasional PND with palpitations but no headache or seizures. He continues to use CPAP along with oxygen overnight.   MEDICAL HISTORY: Past Medical History  Diagnosis Date  . Personal history of other diseases of circulatory system   . 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 Chronic lymphocytic thyroiditis   . Morbid obesity   . Other chronic pulmonary heart diseases   . Obstructive sleep apnea (adult) (pediatric)   . Other and unspecified hyperlipidemia   . Fibromyalgia   . Goiter   . Thyroiditis, autoimmune   . Hypothyroidism, acquired, autoimmune   . Solitary kidney, acquired   . Fatigue   . Hyperparathyroidism, secondary   . Vitamin D deficiency disease   . Hyperparathyroidism   . H/O varicose veins 03/27/12  . Type II or unspecified type diabetes mellitus without mention of complication, not stated as uncontrolled   . Diabetes mellitus type II   . Infections of kidney 03/27/12  . History of measles, mumps, or rubella 03/27/12  . Tubal pregnancy 12/23/76  . Unspecified essential hypertension     20 yrs ago  . Blood transfusion 1977    [facility] [place]  . Complication of anesthesia 2010    hard to wake up after knee surgery  . Anemia    . Cardiomyopathy, nonischemic     EF 45 %  . Endometrial hyperplasia with atypia 05/05/12  . Diastolic HF (heart failure) 11/05/2012  . A-fib   . Echocardiogram abnormal 07/15/12    severe MR,mod to severe TR,mod to severe PA hypertension  . Pulmonary arterial hypertension   . PAF (paroxysmal atrial fibrillation), 1st episode since DCCV in Jan/14 03/19/2013  . Ulcers of both lower extremities, small, now with cellulitis 06/05/2013    INTERIM HISTORY: has HASHIMOTO'S THYROIDITIS; DIABETES MELLITUS; OBESITY, MORBID; OBSTRUCTIVE SLEEP APNEA; HYPERTENSION; HYPERTENSION, PULMONARY; DYSPNEA; PACEMAKER, PERMANENT; Monoclonal gammopathy of unknown significance; Fibromyalgia syndrome; Obesity hypoventilation syndrome; Goiter; Thyroiditis, autoimmune; Hypothyroidism, acquired, autoimmune; Solitary kidney, acquired; Fatigue; Hyperparathyroidism, secondary; Vitamin D deficiency disease; Infected traumatic leg ulcer; Cardiomyopathy, nonischemic; Endometrial hyperplasia with atypia - s/p Mirena IUD 04/2012 - IUD removed; Type 2 diabetes mellitus; Chronic combined systolic and diastolic HF (heart failure); PAF (paroxysmal atrial fibrillation), 1st episode since DCCV in Jan/14; Long term (current) use of anticoagulants; CKD (chronic kidney disease) stage 4, GFR 15-29 ml/min; Ulcers of both lower extremities, small, now with cellulitis; Atrial fibrillation, persistent; Hypokalemia, replacing; Cellulitis of lower leg; and Anemia on her problem list.    ALLERGIES:  is allergic to ivp dye; betadine; diphenhydramine hcl; fish allergy; iohexol; penicillins; povidone; promethazine hcl; tape; clindamycin; iodine; and morphine sulfate.  MEDICATIONS: has a current medication list which includes the following prescription(s): albuterol, albuterol, amiodarone, calcitriol, calcium citrate-vitamin d, digoxin, glyburide, hydrocodone-acetaminophen, insulin glargine, insulin pen needle, iron polysaccharides, levothyroxine, metoprolol  tartrate, multivitamin, potassium chloride sa, sitagliptin,  tiotropium, torsemide, vitamin d (ergocalciferol), warfarin, nitroglycerin, nitroglycerin, one touch ultra test, and zolpidem.  SURGICAL HISTORY:  Past Surgical History  Procedure Laterality Date  . Cholecystectomy    . Pacemaker placement  02/26/11    Medtronic Adapta  . Knee surgery    . Resection duplicate left kidney    . 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 Hernia repair  1999    ruptured ;  . D& c with hysteroscopy & cervical polypectomy  05/05/12  . Cardioversion N/A 02/06/2013    Successful  . Iud removal  01/30/13  . Ectopic pregnancy surgery  1978  . Cardioversion N/A 06/08/2013    Procedure: CARDIOVERSION;  Surgeon: Thurmon Fair, MD;  Location: [facility] OR;  Service: Cardiovascular;  Laterality: N/A;  Room [phone]    FAMILY HISTORY: family history includes Cancer in her maternal grandmother; Cardiomyopathy in her brother; Diabetes in her mother; Heart attack in her mother.  SOCIAL HISTORY:  reports that she has never smoked. She has never used smokeless tobacco. She reports that she does not drink alcohol or use illicit drugs.  REVIEW OF SYSTEMS:  Other than that discussed above is noncontributory.  PHYSICAL EXAMINATION: ECOG PERFORMANCE STATUS: 2 - Symptomatic, <50% confined to bed  Blood pressure 110/74, pulse 75, temperature 97.2 F (36.2 C), temperature source Oral, resp. rate 18, weight 322 lb 8 oz (146.285 kg).  GENERAL:alert, no distress and comfortable. Morbidly obese. SKIN: skin color, texture, turgor are normal, no rashes or significant lesions EYES: normal, Conjunctiva are pink and non-injected, sclera clear OROPHARYNX:no exudate, no erythema and lips, buccal mucosa, and tongue normal  NECK: supple, thyroid normal size, non-tender, without nodularity CHEST: Normal AP diameter with no breast masses. Pacemaker in place LYMPH:  no palpable lymphadenopathy in the cervical, axillary or  inguinal LUNGS: clear to auscultation and percussion with normal breathing effort HEART: Irregularly irregular with no S3. ABDOMEN:abdomen soft, non-tender and normal bowel sounds Musculoskeletal:no cyanosis of digits and no clubbing. +4 edema both lower extremities, left greater than right  NEURO: alert & oriented x 3 with fluent speech, no focal motor/sensory deficits   LABORATORY DATA: Infusion on 09/21/2013  Component Date Value Range Status  . WBC 09/21/2013 5.8  4.0 - 10.5 K/uL Final  . RBC 09/21/2013 3.73* 3.87 - 5.11 MIL/uL Final  . Hemoglobin 09/21/2013 11.8* 12.0 - 15.0 g/dL Final  . HCT 96/03/5408 36.1  36.0 - 46.0 % Final  . MCV 09/21/2013 96.8  78.0 - 100.0 fL Final  . MCH 09/21/2013 31.6  26.0 - 34.0 pg Final  . MCHC 09/21/2013 32.7  30.0 - 36.0 g/dL Final  . RDW 81/19/1478 17.0* 11.5 - 15.5 % Final  . Platelets 09/21/2013 166  150 - 400 K/uL Final  . Neutrophils Relative % 09/21/2013 79* 43 - 77 % Final  . Neutro Abs 09/21/2013 4.5  1.7 - 7.7 K/uL Final  . Lymphocytes Relative 09/21/2013 16  12 - 46 % Final  . Lymphs Abs 09/21/2013 0.9  0.7 - 4.0 K/uL Final  . Monocytes Relative 09/21/2013 5  3 - 12 % Final  . Monocytes Absolute 09/21/2013 0.3  0.1 - 1.0 K/uL Final  . Eosinophils Relative 09/21/2013 0  0 - 5 % Final  . Eosinophils Absolute 09/21/2013 0.0  0.0 - 0.7 K/uL Final  . Basophils Relative 09/21/2013 0  0 - 1 % Final  . Basophils Absolute 09/21/2013 0.0  0.0 - 0.1 K/uL Final  . Sodium 09/21/2013 140  135 - 145 mEq/L Final  . Potassium 09/21/2013 3.3* 3.5 - 5.1 mEq/L Final  . Chloride 09/21/2013 94* 96 - 112 mEq/L Final  . CO2 09/21/2013 34* 19 - 32 mEq/L Final  . Glucose, Bld 09/21/2013 150* 70 - 99 mg/dL Final  . BUN 16/10/9603 56* 6 - 23 mg/dL Final  . Creatinine, Ser 09/21/2013 2.19* 0.50 - 1.10 mg/dL Final  . Calcium 54/08/8118 9.4  8.4 - 10.5 mg/dL Final  . Total Protein 09/21/2013 8.0  6.0 - 8.3 g/dL Final  . Albumin 14/78/2956 3.3* 3.5 - 5.2 g/dL  Final  . AST 21/30/8657 25  0 - 37 U/L Final  . ALT 09/21/2013 27  0 - 35 U/L Final  . Alkaline Phosphatase 09/21/2013 101  39 - 117 U/L Final  . Total Bilirubin 09/21/2013 1.5* 0.3 - 1.2 mg/dL Final  . GFR calc non Af Amer 09/21/2013 22* >90 mL/min Final  . GFR calc Af Amer 09/21/2013 26* >90 mL/min Final   Comment: (NOTE)                          The eGFR has been calculated using the CKD EPI equation.                          This calculation has not been validated in all clinical situations.                          eGFR's persistently <90 mL/min signify possible Chronic Kidney                          Disease.  . Total Protein 09/21/2013 7.5  6.0 - 8.3 g/dL Final  . Albumin ELP 84/69/6295 44.4* 55.8 - 66.1 % Final  . Alpha-1-Globulin 09/21/2013 7.2* 2.9 - 4.9 % Final  . Alpha-2-Globulin 09/21/2013 9.4  7.1 - 11.8 % Final  . Beta Globulin 09/21/2013 6.5  4.7 - 7.2 % Final  . Beta 2 09/21/2013 13.4* 3.2 - 6.5 % Final  . Gamma Globulin 09/21/2013 19.1* 11.1 - 18.8 % Final  . M-Spike, % 09/21/2013 0.46   Final   Comment: (NOTE)                          MSPIKE 2: 0.20                          MSPIKE 3: 0.78  . SPE Interp. 09/21/2013 (NOTE)   Final   Comment: Three restricted bands consistent with monoclonal proteins are                          present.                          The monoclonal protein peaks account for 0.46 g/dL and 2.84 g/dL,                          respectively, of the total 1.01 g/dL of protein in the beta-2 region.                          The monoclonal protein peak accounts for 0.78  g/dL of the total                          1.43 g/dL of protein in the gamma region.                          Results are consistent with SPE performed on 03/24/13                          Reviewed by Dallas Breeding, MD, PhD, FCAP (Electronic Signature on                          File)  . Comment 09/21/2013 (NOTE)   Final   Comment: ---------------                           Serum protein electrophoresis is a useful screening procedure in the                          detection of various pathophysiologic states such as inflammation,                          gammopathies, protein loss and other dysproteinemias.  Immunofixation                          electrophoresis (IFE) is a more sensitive technique for the                          identification of M-proteins found in patients with monoclonal                          gammopathy of unknown significance (MGUS), amyloidosis, early or                          treated myeloma or macroglobulinemia, solitary plasmacytoma or                          extramedullary plasmacytoma.  . IgG (Immunoglobin G), Serum 09/21/2013 1500  690 - 1700 mg/dL Final  . IgA 86/57/8469 889* 69 - 380 mg/dL Final  . IgM, Serum 62/95/2841 56* 52 - 322 mg/dL Final  . Immunofix Electr Int 09/21/2013 (NOTE)   Final   Comment: Monoclonal IgA kappa protein is present.                          Polymerization pattern is noted.                          Monoclonal IgG lambda protein is present.                          Area of slightly restricted mobility in the IgG and Kappa lanes.                          Suggest repeat in 6-8 months, if clinically indicated.  Reviewed by Dallas Breeding, MD, PhD, FCAP (Electronic Signature on                          File)                          Performed at [facility]  . Kappa free light chain 09/21/2013 7.64* 0.33 - 1.94 mg/dL Final  . Lamda free light chains 09/21/2013 6.26* 0.57 - 2.63 mg/dL Final  . Kappa, lamda light chain ratio 09/21/2013 1.22  0.26 - 1.65 Final   Performed at [facility]  . Erythropoietin 09/21/2013 42.8* 2.6 - 18.5 mIU/mL Final   Comment: (NOTE)                          Because of diurnal variations in Erythropoietin levels, it is                          important to collect samples at a consistent time of day.  Morning                           samples collected between 7:30 AM and 12:00 noon are recommended.                          Performed at [facility]  . Ferritin 09/21/2013 303* 10 - 291 ng/mL Final   Performed at [facility]  Anti-coag visit on 09/08/2013  Component Date Value Range Status  . INR 09/08/2013 2.3   Final     Urinalysis    Component Value Date/Time   COLORURINE YELLOW 03/24/2012 1212   APPEARANCEUR CLEAR 03/24/2012 1212   LABSPEC 1.011 03/24/2012 1212   PHURINE 5.5 03/24/2012 1212   GLUCOSEU NEG 03/24/2012 1212   HGBUR NEG 03/24/2012 1212   BILIRUBINUR NEG 03/24/2012 1212   BILIRUBINUR neg 08/30/2011 1313   KETONESUR NEG 03/24/2012 1212   PROTEINUR NEG 03/24/2012 1212   UROBILINOGEN 0.2 03/24/2012 1212   UROBILINOGEN neg 08/30/2011 1313   NITRITE NEG 03/24/2012 1212   NITRITE neg 08/30/2011 1313   LEUKOCYTESUR NEG 03/24/2012 1212    RADIOGRAPHIC STUDIES: No results found.  ASSESSMENT: #1. Iron deficiency, corrected with intravenous iron now with a ferritin of 303 in the setting of GFR 22 and hemoglobin of 11.8 #2. Polyclonal gammopathy . #3 massive obesity   #4 CHF with chronic atrial fibrillation and cardiomyopathy   #5 diabetes mellitus   #6 hyperlipidemia   #7 Medtronic pacemaker insertion for sick sinus syndrome   #8 history of Hashimoto's thyroiditis   PLAN: #1 continue on current medications #2 followup in 4 months with lab tests.   All questions were answered. The patient knows to call the clinic with any problems, questions or concerns. We can certainly see the patient much sooner if necessary.   I spent 30 minutes counseling the patient face to face. The total time spent in the appointment was 25 minutes.    Maurilio Lovely, MD 09/29/2013 4:00 PM

## 2013-09-30 NOTE — Progress Notes (Signed)
This encounter was created in error - please disregard.

## 2013-10-06 ENCOUNTER — Ambulatory Visit (INDEPENDENT_AMBULATORY_CARE_PROVIDER_SITE_OTHER): Payer: Medicare Other | Admitting: Cardiovascular Disease

## 2013-10-06 ENCOUNTER — Ambulatory Visit (INDEPENDENT_AMBULATORY_CARE_PROVIDER_SITE_OTHER): Payer: Medicare Other | Admitting: Pharmacist Clinician (PhC)/ Clinical Pharmacy Specialist

## 2013-10-06 ENCOUNTER — Encounter: Payer: Self-pay | Admitting: Cardiovascular Disease

## 2013-10-06 VITALS — BP 128/80 | HR 71 | Resp 16 | Ht 67.0 in | Wt 334.6 lb

## 2013-10-06 DIAGNOSIS — I4891 Unspecified atrial fibrillation: Secondary | ICD-10-CM

## 2013-10-06 DIAGNOSIS — I5042 Chronic combined systolic (congestive) and diastolic (congestive) heart failure: Secondary | ICD-10-CM

## 2013-10-06 DIAGNOSIS — I428 Other cardiomyopathies: Secondary | ICD-10-CM

## 2013-10-06 DIAGNOSIS — I480 Paroxysmal atrial fibrillation: Secondary | ICD-10-CM

## 2013-10-06 DIAGNOSIS — Z7901 Long term (current) use of anticoagulants: Secondary | ICD-10-CM

## 2013-10-06 DIAGNOSIS — N184 Chronic kidney disease, stage 4 (severe): Secondary | ICD-10-CM

## 2013-10-06 DIAGNOSIS — I4819 Other persistent atrial fibrillation: Secondary | ICD-10-CM

## 2013-10-06 LAB — POCT INR: INR: 2.8

## 2013-10-06 LAB — BASIC METABOLIC PANEL
BUN: 55 mg/dL — ABNORMAL HIGH (ref 6–23)
Chloride: 93 mEq/L — ABNORMAL LOW (ref 96–112)
Creat: 1.82 mg/dL — ABNORMAL HIGH (ref 0.50–1.10)
Glucose, Bld: 155 mg/dL — ABNORMAL HIGH (ref 70–99)

## 2013-10-06 LAB — PACEMAKER DEVICE OBSERVATION

## 2013-10-06 MED ORDER — METOPROLOL TARTRATE 25 MG PO TABS: 25.0000 mg | ORAL_TABLET | Freq: Two times a day (BID) | ORAL | Status: DC

## 2013-10-06 MED ORDER — POTASSIUM CHLORIDE CRYS ER 20 MEQ PO TBCR: 20.0000 meq | EXTENDED_RELEASE_TABLET | Freq: Three times a day (TID) | ORAL | Status: DC

## 2013-10-06 NOTE — Patient Instructions (Addendum)
Your physician recommends that you schedule a follow-up appointment in: 3 months.  

## 2013-10-07 ENCOUNTER — Encounter: Payer: Self-pay | Admitting: Cardiovascular Disease

## 2013-10-07 NOTE — Assessment & Plan Note (Signed)
She appears to be at baseline, NYHA functional class III. As always significant lower extremity edema, multifactorial.

## 2013-10-07 NOTE — Assessment & Plan Note (Signed)
She has required 2 cardioversions in the last 12 months, each time with significant improvement in clinical status and reduction in the signs and symptoms of congestive heart failure. Unfortunately she has a very dilated left atrium and recurrence of atrial fibrillation is certain. Attempts to reduce her amiodarone dosage has resulted in recurrent atrial fibrillation. No changes are planned to her antiarrhythmics. She will remain on lifelong warfarin anticoagulation barring any bleeding problems. Atrial fibrillation has not occurred recently. It is safe for her to interrupt her warfarin for the planned vitrectomy. Note recent normal liver function tests and thyroid function tests (repeat periodically while on amiodarone).

## 2013-10-07 NOTE — Assessment & Plan Note (Deleted)
She has required 2 cardioversions the last 12 months, each time with significant improvement in clinical status and reduction in the signs and symptoms of congestive heart failure. Unfortunately she has a very dilated left atrium and recurrence of atrial fibrillation is certain. Attempts to reduce her amiodarone dosage has resulted in recurrent atrial fibrillation. No changes are planned to her antiarrhythmics. She will remain on lifelong warfarin anticoagulation barring any bleeding problems. Atrial fibrillation has not occurred recently. It is safe for her to interrupt her warfarin for the planned vitrectomy.

## 2013-10-07 NOTE — Progress Notes (Signed)
Patient ID: [name], female   DOB: 1950-12-19, 63 y.o.   MRN: [id number]      Reason for office visit Recurrent persistent atrial fibrillation, pacemaker, nonischemic cardiomyopathy, congestive heart failure (systolic chronic), mitral insufficiency  [name] is actually doing well. Remarkably she has now maintained atrial paced rhythm without recurrence of atrial fibrillation for close to 4 months since her cardioversion.  She is well anticoagulated and has not had any embolic events or any new bleeding problems, although she is therapeutically anticoagulated with warfarin. After receiving intravenous iron her hemoglobin has increased to 11.8 and her ferritin is normal. Recurrent problems with hypokalemia seem to have finally stabilized on the current dose of potassium supplementation. Also quite remarkably there has been a significant improvement in her renal function, with a recent creatinine of 1.8. This has occurred without evidence of volume overload. Recently she has not needed to use any extra doses of torsemide. This makes me wonder whether the anemia was making it hard to keep her heart failure compensated. As always she has severe lower extremity edema secondary to combination of right heart failure and lymphedema. She has severe mitral insufficiency and severe tricuspid insufficiency. She is not felt to be a candidate for surgical valve repair secondary to her multiple comorbid conditions and severe obesity. 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 She has essentially 100% pacing in both the atrium and the ventricle. Because of her fairly volatile renal insufficiency, she's not receiving treatment with ACE inhibitors or angiotensin receptor blockers. She has told a couple of occasions that she will not to go on hemodialysis. She is planning to undergo a right eye vitrectomy for deteriorating vision.    Allergies  Allergen Reactions  . Ivp Dye [Iodinated  Diagnostic Agents] Shortness Of Breath    Turn red, can't breathe  . Betadine [Povidone Iodine]   . Diphenhydramine Hcl Swelling    In hands and eyes  . Fish Allergy Nausea And Vomiting  . Iohexol      Desc: PT TURNS RED AND WHEEZING   . Penicillins Other (See Comments)    Resp arrest as child  . Povidone Other (See Comments)    Wheezing, turn red, can't breath, and blisters  . Promethazine Hcl Other (See Comments)    Low Blood Pressure  . Tape     Blisters, can use paper tape for short periods  . Clindamycin Hives and Rash    wheezing  . Iodine Rash  . Morphine Sulfate Nausea And Vomiting and Rash    Current Outpatient Prescriptions  Medication Sig Dispense Refill  . albuterol (PROVENTIL HFA;VENTOLIN HFA) 108 (90 BASE) MCG/ACT inhaler Inhale 2 puffs into the lungs every 6 (six) hours as needed. For shortness of breath      . albuterol (PROVENTIL) (2.5 MG/3ML) 0.083% nebulizer solution Take 2.5 mg by nebulization every 6 (six) hours as needed. For shortness of breath      . ALPRAZolam (XANAX) 0.25 MG tablet Take 0.25 mg by mouth daily as needed.      Marland Kitchen amiodarone (PACERONE) 100 MG tablet Take 2 tablets (200 mg total) by mouth 2 (two) times daily.  180 tablet  5  . calcitRIOL (ROCALTROL) 0.25 MCG capsule Take 0.25 mcg by mouth 2 (two) times daily.      . calcium citrate-vitamin D 200-200 MG-UNIT TABS Take 1 tablet by mouth 4 (four) times daily -  before meals and at bedtime.      . digoxin 62.5  MCG TABS Take 0.0625 mg by mouth daily.  30 tablet  5  . glyBURIDE (DIABETA) 5 MG tablet Take 5-10 mg by mouth 2 (two) times daily with a meal. Take 10mg  in the morning and 5mg  at night      . HYDROcodone-acetaminophen (NORCO) 10-325 MG per tablet Take 1 tablet by mouth 2 (two) times daily as needed for pain.  90 tablet  5  . insulin glargine (LANTUS) 100 UNIT/ML injection Use up to 27 units daily      . Insulin Pen Needle (B-D UF III MINI PEN NEEDLES) 31G X 5 MM MISC Check blood sugar up to  6x daily  200 each  3  . iron polysaccharides (NIFEREX) 150 MG capsule Take 150 mg by mouth 2 (two) times daily.      Marland Kitchen levothyroxine (SYNTHROID, LEVOTHROID) 125 MCG tablet Take 125 mcg by mouth daily before breakfast.      . metoprolol tartrate (LOPRESSOR) 25 MG tablet Take 1 tablet (25 mg total) by mouth 2 (two) times daily.  60 tablet  11  . Multiple Vitamin (MULTIVITAMIN) tablet Take 1 tablet by mouth daily.       . nitroGLYCERIN (NITRODUR - DOSED IN MG/24 HR) 0.4 mg/hr Place 1 patch onto the skin every other day.      . nitroGLYCERIN (NITROSTAT) 0.4 MG SL tablet Place 0.4 mg under the tongue every 5 (five) minutes as needed. For chest pain      . ONE TOUCH ULTRA TEST test strip       . potassium chloride SA (K-DUR,KLOR-CON) 20 MEQ tablet Take 1 tablet (20 mEq total) by mouth 3 (three) times daily.  90 tablet  11  . sitaGLIPtin (JANUVIA) 100 MG tablet Take 100 mg by mouth daily.      Marland Kitchen tiotropium (SPIRIVA) 18 MCG inhalation capsule Place 18 mcg into inhaler and inhale daily.      . TORSEMIDE PO Take 50 mg by mouth 2 (two) times daily.      . Vitamin D, Ergocalciferol, (DRISDOL) 50000 UNITS CAPS Take 50,000 Units by mouth 2 (two) times a week. Takes on tuesdays and fridays      . warfarin (COUMADIN) 5 MG tablet Take 5 mg by mouth daily. 5/2.5/5/2.5      . zolpidem (AMBIEN) 10 MG tablet Take 10 mg by mouth at bedtime as needed. sleep       No current facility-administered medications for this visit.    Past Medical History  Diagnosis Date  . Personal history of other diseases of circulatory system   . 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 Chronic lymphocytic thyroiditis   . Morbid obesity   . Other chronic pulmonary heart diseases   . Obstructive sleep apnea (adult) (pediatric)   . Other and unspecified hyperlipidemia   . Fibromyalgia   . Goiter   . Thyroiditis, autoimmune   . Hypothyroidism, acquired, autoimmune   . Solitary kidney, acquired   . Fatigue   .  Hyperparathyroidism, secondary   . Vitamin D deficiency disease   . Hyperparathyroidism   . H/O varicose veins 03/27/12  . Type II or unspecified type diabetes mellitus without mention of complication, not stated as uncontrolled   . Diabetes mellitus type II   . Infections of kidney 03/27/12  . History of measles, mumps, or rubella 03/27/12  . Tubal pregnancy 12/23/76  . Unspecified essential hypertension     20 yrs ago  . Blood transfusion  1977    [facility]  . Complication of anesthesia 2010    hard to wake up after knee surgery  . Anemia   . Cardiomyopathy, nonischemic     EF 45 %  . Endometrial hyperplasia with atypia 05/05/12  . Diastolic HF (heart failure) 11/05/2012  . A-fib   . Echocardiogram abnormal 07/15/12    severe MR,mod to severe TR,mod to severe PA hypertension  . Pulmonary arterial hypertension   . PAF (paroxysmal atrial fibrillation), 1st episode since DCCV in Jan/14 03/19/2013  . Ulcers of both lower extremities, small, now with cellulitis 06/05/2013    Past Surgical History  Procedure Laterality Date  . Cholecystectomy    . Pacemaker placement  02/26/11    Medtronic Adapta  . Knee surgery    . Resection duplicate left kidney    . 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 Hernia repair  1999    ruptured ;  . D& c with hysteroscopy & cervical polypectomy  05/05/12  . Cardioversion N/A 02/06/2013    Successful  . Iud removal  01/30/13  . Ectopic pregnancy surgery  1978  . Cardioversion N/A 06/08/2013    Procedure: CARDIOVERSION;  Surgeon: Thurmon Fair, MD;  Location: [facility] OR;  Service: Cardiovascular;  Laterality: N/A;  Room [phone]    Family History  Problem Relation Age of Onset  . Cardiomyopathy Brother   . Diabetes Mother   . Cancer Maternal Grandmother   . Heart attack Mother     History   Social History  . Marital Status: Married    Spouse Name: N/A    Number of Children: N/A  . Years of Education: N/A   Occupational History  . retired Scientist, physiological      Social History Main Topics  . Smoking status: Never Smoker   . Smokeless tobacco: Never Used  . Alcohol Use: No  . Drug Use: No  . Sexual Activity: Yes    Partners: Male    Birth Control/ Protection: Post-menopausal   Other Topics Concern  . Not on file   Social History Narrative  . No narrative on file    Review of systems: She has chronic lower showed edema that waxes and wanes in severity. The ulcers on her legs seem to be healing although there is intermittent redness. She has no change in her chronic shortness of breath NYHA functional class III. She denies orthopnea and PND. She has not had syncope or palpitations. She denies angina, bleeding problems, focal neurological deficits, mood problems, unexpected weight changes, abdominal pain or change in bowel habits, polyuria, polydipsia frequency urgency hematuria cough hemoptysis or wheezing   PHYSICAL EXAM BP 128/80  Pulse 71  Resp 16  Ht 5\' 7"  (1.702 m)  Wt 334 lb 9.6 oz (151.774 kg)  BMI 52.39 kg/m2 General appearance: alert, cooperative, no distress and morbidly obese  Neck: no adenopathy, no carotid bruit, supple, symmetrical, trachea midline, thyroid not enlarged, symmetric, no tenderness/mass/nodules and It difficult to see jugular venous pulsations due to obesity  Lungs: clear to auscultation bilaterally  Heart: regular rate and rhythm, S1: split, S2: wide splitting, S3 present and systolic murmur: holosystolic 2/6, blowing at apex  Abdomen: soft, non-tender; bowel sounds normal; no masses, no organomegaly  Extremities: edema 3+ symmetrically bilaterally and Multiple 2-3 mm ulcers with crusts bilaterally  Pulses: 2+ and symmetric  Skin: Ulcers as described above, no evidence of cellulitis  Neurologic: Alert and oriented X 3, normal strength and tone.  Normal symmetric reflexes. Normal coordination and gait   EKG: Apaced Vpaced  Lipid Panel  No results found for this basename: chol, trig, hdl, cholhdl, vldl,  ldlcalc    BMET    Component Value Date/Time   NA 139 10/05/2013 1728   K 3.5 10/05/2013 1728   CL 93* 10/05/2013 1728   CO2 33* 10/05/2013 1728   GLUCOSE 155* 10/05/2013 1728   BUN 55* 10/05/2013 1728   CREATININE 1.82* 10/05/2013 1728   CREATININE 2.19* 09/21/2013 1002   CREATININE 1.28* 02/12/2011 1000   CALCIUM 9.0 10/05/2013 1728   CALCIUM 9.5 03/24/2012 1212   GFRNONAA 22* 09/21/2013 1002   GFRAA 26* 09/21/2013 1002     ASSESSMENT AND PLAN Cardiomyopathy, nonischemic Her most recent left ventricular ejection fraction was 30-35%. The left ventricle is moderately dilated. It appears that the driving cause of her cardiomyopathy is now severe mitral insufficiency. Unfortunately she is not a good surgical candidate. Afterload reduction with renin angiotensin aldosterone system inhibitors is also limited by her serious kidney problems. She has actually had a fairly stable clinical course for the last 3 months or so. Improvement in her anemia appears to have made a difference the  Chronic combined systolic and diastolic HF (heart failure) She appears to be at baseline, NYHA functional class III. As always significant lower extremity edema, multifactorial.  Atrial fibrillation, persistent She has required 2 cardioversions in the last 12 months, each time with significant improvement in clinical status and reduction in the signs and symptoms of congestive heart failure. Unfortunately she has a very dilated left atrium and recurrence of atrial fibrillation is certain. Attempts to reduce her amiodarone dosage has resulted in recurrent atrial fibrillation. No changes are planned to her antiarrhythmics. She will remain on lifelong warfarin anticoagulation barring any bleeding problems. Atrial fibrillation has not occurred recently. It is safe for her to interrupt her warfarin for the planned vitrectomy. Note recent normal liver function tests and thyroid function tests (repeat periodically while on  amiodarone).  CKD (chronic kidney disease) stage 4, GFR 15-29 ml/min Most recent serum creatinine 1.8, considerably improved from 2.6 earlier this year. She still requires very high doses of potassium supplement to compensate for her diuretics   low risk of major cardiovascular complications with a planned right eye vitrectomy. Warfarin can be interrupted 5 days before the procedure and be resumed as soon as is considered safe by her ophthalmologist.   Meds ordered this encounter  Medications  . ALPRAZolam (XANAX) 0.25 MG tablet    Sig: Take 0.25 mg by mouth daily as needed.  . metoprolol tartrate (LOPRESSOR) 25 MG tablet    Sig: Take 1 tablet (25 mg total) by mouth 2 (two) times daily.    Dispense:  60 tablet    Refill:  11  . potassium chloride SA (K-DUR,KLOR-CON) 20 MEQ tablet    Sig: Take 1 tablet (20 mEq total) by mouth 3 (three) times daily.    Dispense:  90 tablet    Refill:  [id number], [place], [facility] [phone] office [phone] pager

## 2013-10-07 NOTE — Assessment & Plan Note (Signed)
Most recent serum creatinine 1.8, considerably improved from 2.6 earlier this year. She still requires very high doses of potassium supplement to compensate for her diuretics

## 2013-10-07 NOTE — Assessment & Plan Note (Signed)
Her most recent left ventricular ejection fraction was 30-35%. The left ventricle is moderately dilated. It appears that the driving cause of her cardiomyopathy is now severe mitral insufficiency. Unfortunately she is not a good surgical candidate. Afterload reduction with renin angiotensin aldosterone system inhibitors is also limited by her serious kidney problems. She has actually had a fairly stable clinical course for the last 3 months or so. Improvement in her anemia appears to have made a difference the

## 2013-10-08 ENCOUNTER — Telehealth: Payer: Self-pay | Admitting: *Deleted

## 2013-10-08 LAB — PACEMAKER DEVICE OBSERVATION
AL THRESHOLD: 0.75 V
ATRIAL PACING PM: 99.9
BATTERY VOLTAGE: 2.77 V
RV LEAD IMPEDENCE PM: 350 Ohm
VENTRICULAR PACING PM: 99.8

## 2013-10-08 MED ORDER — METOPROLOL TARTRATE 25 MG PO TABS: 37.5000 mg | ORAL_TABLET | Freq: Two times a day (BID) | ORAL | Status: DC

## 2013-10-08 NOTE — Telephone Encounter (Signed)
Message copied by Vita Barley on Thu Oct 08, 2013  9:55 AM ------      Message from: Thurmon Fair      Created: Tue Oct 06, 2013  9:34 AM       Creat much better at 1.8, K borderline at 3.5 ------

## 2013-10-08 NOTE — Telephone Encounter (Signed)
Lab results given to patient.  States she is taking metoprolol tart 25mg  1 1/2 bid and always has been.  Refills on Rx sent in recently for 25mg  bid.  Will change in the system and send correct Rx to pharmacy.

## 2013-10-19 ENCOUNTER — Encounter: Payer: Self-pay | Admitting: Cardiovascular Disease

## 2013-11-11 ENCOUNTER — Telehealth: Payer: Self-pay

## 2013-11-11 NOTE — Telephone Encounter (Signed)
Tried to contact patient regarding her need for a mammogram.  She was not available.

## 2013-11-18 ENCOUNTER — Other Ambulatory Visit: Payer: Self-pay | Admitting: *Deleted

## 2013-11-18 ENCOUNTER — Ambulatory Visit: Payer: Medicare Other | Admitting: Pharmacist Clinician (PhC)/ Clinical Pharmacy Specialist

## 2013-11-23 ENCOUNTER — Other Ambulatory Visit: Payer: Self-pay | Admitting: *Deleted

## 2013-11-23 MED ORDER — TORSEMIDE 100 MG PO TABS: 50.0000 mg | ORAL_TABLET | Freq: Two times a day (BID) | ORAL | Status: DC

## 2013-11-23 MED ORDER — DIGOXIN 125 MCG PO TABS: 0.0625 mg | ORAL_TABLET | Freq: Every day | ORAL | Status: DC

## 2013-11-23 NOTE — Telephone Encounter (Signed)
Rx was sent to pharmacy electronically. 

## 2013-11-24 ENCOUNTER — Ambulatory Visit: Payer: Medicare Other | Admitting: Pharmacist Clinician (PhC)/ Clinical Pharmacy Specialist

## 2013-11-30 LAB — COMPREHENSIVE METABOLIC PANEL
ALT: 18 U/L (ref 0–35)
AST: 19 U/L (ref 0–37)
Alkaline Phosphatase: 90 U/L (ref 39–117)
Calcium: 9.1 mg/dL (ref 8.4–10.5)
Chloride: 97 mEq/L (ref 96–112)
Creat: 1.69 mg/dL — ABNORMAL HIGH (ref 0.50–1.10)
Glucose, Bld: 140 mg/dL — ABNORMAL HIGH (ref 70–99)
Total Bilirubin: 1.3 mg/dL — ABNORMAL HIGH (ref 0.3–1.2)

## 2013-11-30 LAB — LIPID PANEL
Cholesterol: 149 mg/dL (ref 0–200)
HDL: 33 mg/dL — ABNORMAL LOW (ref >39)
Total CHOL/HDL Ratio: 4.5 Ratio
Triglycerides: 137 mg/dL (ref <150)
VLDL: 27 mg/dL (ref 0–40)

## 2013-11-30 LAB — HEMOGLOBIN A1C
Hgb A1c MFr Bld: 6.8 % — ABNORMAL HIGH
Mean Plasma Glucose: 148 mg/dL — ABNORMAL HIGH

## 2013-11-30 LAB — T3, FREE: T3, Free: 2.3 pg/mL (ref 2.3–4.2)

## 2013-11-30 LAB — T4, FREE: Free T4: 1.58 ng/dL (ref 0.80–1.80)

## 2013-11-30 LAB — TSH: TSH: 2.831 u[IU]/mL (ref 0.350–4.500)

## 2013-12-01 LAB — MICROALBUMIN / CREATININE URINE RATIO
Creatinine, Urine: 32.3 mg/dL
Microalb, Ur: 1.53 mg/dL (ref 0.00–1.89)

## 2013-12-03 ENCOUNTER — Ambulatory Visit: Payer: Medicare Other | Admitting: "Endocrinology

## 2013-12-04 ENCOUNTER — Ambulatory Visit (INDEPENDENT_AMBULATORY_CARE_PROVIDER_SITE_OTHER): Payer: Medicare Other | Admitting: "Endocrinology

## 2013-12-04 ENCOUNTER — Encounter: Payer: Self-pay | Admitting: "Endocrinology

## 2013-12-04 VITALS — BP 106/73 | HR 76 | Wt 332.4 lb

## 2013-12-04 DIAGNOSIS — E1142 Type 2 diabetes mellitus with diabetic polyneuropathy: Secondary | ICD-10-CM

## 2013-12-04 DIAGNOSIS — E038 Other specified hypothyroidism: Secondary | ICD-10-CM

## 2013-12-04 DIAGNOSIS — I10 Essential (primary) hypertension: Secondary | ICD-10-CM

## 2013-12-04 DIAGNOSIS — E11649 Type 2 diabetes mellitus with hypoglycemia without coma: Secondary | ICD-10-CM

## 2013-12-04 DIAGNOSIS — E1169 Type 2 diabetes mellitus with other specified complication: Secondary | ICD-10-CM

## 2013-12-04 DIAGNOSIS — R609 Edema, unspecified: Secondary | ICD-10-CM

## 2013-12-04 DIAGNOSIS — E1149 Type 2 diabetes mellitus with other diabetic neurological complication: Secondary | ICD-10-CM

## 2013-12-04 DIAGNOSIS — E211 Secondary hyperparathyroidism, not elsewhere classified: Secondary | ICD-10-CM

## 2013-12-04 DIAGNOSIS — E063 Autoimmune thyroiditis: Secondary | ICD-10-CM

## 2013-12-04 DIAGNOSIS — R5381 Other malaise: Secondary | ICD-10-CM

## 2013-12-04 LAB — GLUCOSE, POCT (MANUAL RESULT ENTRY): POC Glucose: 179 mg/dl — AB (ref 70–99)

## 2013-12-04 NOTE — Patient Instructions (Signed)
Follow up appointment in 3 month. Call Dr Fransico Amandy Chubbuck on Sunday evening, December 4th, between 8:00-9:30 {PM to discuss BGs.

## 2013-12-04 NOTE — Progress Notes (Signed)
Subjective:  Patient Name: [name] Date of Birth: 07-08-1950  MRN: [id number]  [name]  presents to the office today for follow-up of her insulin-requiring T2DM,obesity, leg edema, left shin ulcer, secondary hyperparathyroidism, vitamin d deficiency disease, hypertension, thyroiditis, hypothyroidism, and edema, in the setting of chronic, recurrent atrial fibrillation and chronic kidney disease.  HISTORY OF PRESENT ILLNESS:   [name] is a 63 y.o. Caucasian woman. [name] was unaccompanied.  1.The patient was first referred to me on 10/30/05 by her primary care provider, Dr. Sharlet Salina, for evaluation and co-management of her diabetes and obesity. I have followed her for the above problems since then. Please see my clinic note from 06/06/11 for detailed description of the course of her type 2 diabetes mellitus, hypothyroidism secondary to Hashimoto's thyroiditis, and hyperparathyroidism secondary to deficiency of calcium intake, vitamin D intake, and renal conversion of 25-hydroxy vitamin D to 1, 25-dihydroxy vitamin D.   2. At her clinic visit on 03/24/12 she had a left shin ulcer that was open and draining. Her HbA1c was 8.0%. Her oral anti-hyperglycemic medications, Januvia, 100 mg/day, and glyburide, 10 mg twice a day, were not adequately controlling her BGs. I finally convinced her to add Lantus insulin, at a dose of 4 units each evening. I also coordinated with Dr. Lenord Fellers and started the patient on topical Bactroban and oral doxycycline. Her ulcer subsequently healed, but she has had several additional leg ulcers since then. We have gradually but progressively increased her Lantus dose since then and her BGs have improved substantially.   3. The patient's last [facility] visit was on 09/09/13.   A. On 09/30/13 she saw Dr Octavia Heir, retinologist, who told her that she does not have diabetic retinopathy. She has very cloudy vitreous fluid in the right eye. She will need a  vitrectomy.   B. On 10/05/13 her potassium was 2.2. Her potassium was increased. The repeat potassium was reportedly normal   C. On 10/07/13 she saw Dr. Camille Bal. A kidney infection was discovered. She was treated with cipro for seven days.  D. On 10/07/13 she saw Dr. Heather Burundi, OD. She had a swollen right eye and swelling of the facial tissue around the orbit. She was diagnosed to have a cellulitis and treated with doxycycline for 10 days and prednisone eye drops for 14 days. The infection resolved. A subsequent eye exam showed that her visual acuity was poor.  E. On 11/02/13 she woke up with severe pain and swelling in her left hand. She saw Dr. Azzie Roup, rheumatologist, who evaluated her. An x-ray showed either cysts or tumors in the wrist. She was given a cortisone injection on 11/02/13..  F. On 11/12 she saw Dr. Penni Bombard in [facility]. She was given a 12-day course of prednisone that ended on 11/23/13. Her left wrist and hand feel better, but are not back to normal.   G. She has not had any further iron transfusions.   H. Her sense of balance is very poor. She hesitates to walk without a walker. When she turns over in bed she often gets dizzy. Changes in head position, especially head extension, also cause dizziness. Her nose has been quite runny off and on, mostly on.   I. She is now taking 27 units of Lantus each evening (0.20 units/kg/day). She also takes glyburide, 10 mg at breakfast and 5 mg at supper and Januvia, 100 each AM. She is having to pay for glyburide because her insurance won't  pay for the medication.      4. Pertinent Review of Systems:  Constitutional: The patient generally feels pretty good. Her energy is a little bit better. She is not tolerating the cold weather very well. She sleeps well with her CPAP and oxygen at 2 liters. Food still does not taste good, so she has not been eating as much.  Eyes: As above. Neck: The patient had an episode of  thyroiditis a month or so ago. She has also been hoarse due to her allergies acting up.   Heart: She remains in sinus rhythm most of the time, but does occasionally note A-fib. Her pacemaker is still working. She has not had any problems with chest pains recently.  Lungs: She has occasional wheezing.  Gastrointestinal: Taking Tums at night has helped with her heartburn. Bowel movents seem normal. She has not had much abdominal pain recently.   Legs: Muscle mass and strength seem normal. There are no complaints of numbness, tingling, burning, or pain. Her previous puncture wounds are continuing to heal. Edema is better when she gets a good night's sleep.   Feet: There are no obvious foot problems, except swelling. The swelling is better. There are no complaints of numbness or pain. She does have tingling and burning when the feet are more swollen. Feet are also colder then.  GYN: No menses Hypoglycemia: She has not had any low BGs recently. Lowest documented BG was 107, compared with 96 at last visit.  5. BG printout: Her average BG was 149.4, compared with 119 at last visit and with 138 at the visit prior. Her BG range is 107-187, compared with 96-167 at last visit and with 90-227 at the visit prior.    PAST MEDICAL, FAMILY, AND SOCIAL HISTORY:  Past Medical History  Diagnosis Date  . Personal history of other diseases of circulatory system   . 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 Chronic lymphocytic thyroiditis   . Morbid obesity   . Other chronic pulmonary heart diseases   . Obstructive sleep apnea (adult) (pediatric)   . Other and unspecified hyperlipidemia   . Fibromyalgia   . Goiter   . Thyroiditis, autoimmune   . Hypothyroidism, acquired, autoimmune   . Solitary kidney, acquired   . Fatigue   . Hyperparathyroidism, secondary   . Vitamin D deficiency disease   . Hyperparathyroidism   . H/O varicose veins 03/27/12  . Type II or unspecified type diabetes  mellitus without mention of complication, not stated as uncontrolled   . Diabetes mellitus type II   . Infections of kidney 03/27/12  . History of measles, mumps, or rubella 03/27/12  . Tubal pregnancy 12/23/76  . Unspecified essential hypertension     20 yrs ago  . Blood transfusion 1977    [facility]  . Complication of anesthesia 2010    hard to wake up after knee surgery  . Anemia   . Cardiomyopathy, nonischemic     EF 45 %  . Endometrial hyperplasia with atypia 05/05/12  . Diastolic HF (heart failure) 11/05/2012  . A-fib   . Echocardiogram abnormal 07/15/12    severe MR,mod to severe TR,mod to severe PA hypertension  . Pulmonary arterial hypertension   . PAF (paroxysmal atrial fibrillation), 1st episode since DCCV in Jan/14 03/19/2013  . Ulcers of both lower extremities, small, now with cellulitis 06/05/2013    Family History  Problem Relation Age of Onset  . Cardiomyopathy Brother   .  Diabetes Mother   . Cancer Maternal Grandmother   . Heart attack Mother     Current outpatient prescriptions:albuterol (PROVENTIL HFA;VENTOLIN HFA) 108 (90 BASE) MCG/ACT inhaler, Inhale 2 puffs into the lungs every 6 (six) hours as needed. For shortness of breath, Disp: , Rfl: ;  albuterol (PROVENTIL) (2.5 MG/3ML) 0.083% nebulizer solution, Take 2.5 mg by nebulization every 6 (six) hours as needed. For shortness of breath, Disp: , Rfl:  ALPRAZolam (XANAX) 0.25 MG tablet, Take 0.25 mg by mouth daily as needed., Disp: , Rfl: ;  amiodarone (PACERONE) 100 MG tablet, Take 2 tablets (200 mg total) by mouth 2 (two) times daily., Disp: 180 tablet, Rfl: 5;  calcitRIOL (ROCALTROL) 0.25 MCG capsule, Take 0.25 mcg by mouth 2 (two) times daily., Disp: , Rfl:  calcium citrate-vitamin D 200-200 MG-UNIT TABS, Take 1 tablet by mouth 4 (four) times daily -  before meals and at bedtime., Disp: , Rfl: ;  digoxin (LANOXIN) 0.125 MG tablet, Take 0.5 tablets (0.0625 mg total) by mouth daily., Disp: 15 tablet, Rfl: 10;  digoxin 62.5 MCG  TABS, Take 0.0625 mg by mouth daily., Disp: 30 tablet, Rfl: 5 glyBURIDE (DIABETA) 5 MG tablet, Take 5-10 mg by mouth 2 (two) times daily with a meal. Take 10mg  in the morning and 5mg  at night, Disp: , Rfl: ;  HYDROcodone-acetaminophen (NORCO) 10-325 MG per tablet, Take 1 tablet by mouth 2 (two) times daily as needed for pain., Disp: 90 tablet, Rfl: 5;  insulin glargine (LANTUS) 100 UNIT/ML injection, Use up to 27 units daily, Disp: , Rfl:  Insulin Pen Needle (B-D UF III MINI PEN NEEDLES) 31G X 5 MM MISC, Check blood sugar up to 6x daily, Disp: 200 each, Rfl: 3;  iron polysaccharides (NIFEREX) 150 MG capsule, Take 150 mg by mouth 2 (two) times daily., Disp: , Rfl: ;  levothyroxine (SYNTHROID, LEVOTHROID) 125 MCG tablet, Take 125 mcg by mouth daily before breakfast., Disp: , Rfl:  metoprolol tartrate (LOPRESSOR) 25 MG tablet, Take 1.5 tablets (37.5 mg total) by mouth 2 (two) times daily., Disp: 90 tablet, Rfl: 11;  Multiple Vitamin (MULTIVITAMIN) tablet, Take 1 tablet by mouth daily. , Disp: , Rfl: ;  nitroGLYCERIN (NITRODUR - DOSED IN MG/24 HR) 0.4 mg/hr, Place 1 patch onto the skin every other day., Disp: , Rfl:  nitroGLYCERIN (NITROSTAT) 0.4 MG SL tablet, Place 0.4 mg under the tongue every 5 (five) minutes as needed. For chest pain, Disp: , Rfl: ;  ONE TOUCH ULTRA TEST test strip, , Disp: , Rfl: ;  potassium chloride SA (K-DUR,KLOR-CON) 20 MEQ tablet, Take 1 tablet (20 mEq total) by mouth 3 (three) times daily., Disp: 90 tablet, Rfl: 11;  sitaGLIPtin (JANUVIA) 100 MG tablet, Take 100 mg by mouth daily., Disp: , Rfl:  tiotropium (SPIRIVA) 18 MCG inhalation capsule, Place 18 mcg into inhaler and inhale daily., Disp: , Rfl: ;  torsemide (DEMADEX) 100 MG tablet, Take 0.5 tablets (50 mg total) by mouth 2 (two) times daily., Disp: 30 tablet, Rfl: 10;  Vitamin D, Ergocalciferol, (DRISDOL) 50000 UNITS CAPS, Take 50,000 Units by mouth 2 (two) times a week. Takes on tuesdays and fridays, Disp: , Rfl:  warfarin  (COUMADIN) 5 MG tablet, Take 5 mg by mouth daily. 5/2.5/5/2.5, Disp: , Rfl:   Allergies as of 12/04/2013 - Review Complete 12/04/2013  Allergen Reaction Noted  . Ivp dye [iodinated diagnostic agents] Shortness Of Breath 05/19/2012  . Betadine [povidone iodine]  04/06/2013  . Diphenhydramine hcl Swelling   . Fish  allergy Nausea And Vomiting 05/29/2011  . Iohexol  07/19/2005  . Penicillins Other (See Comments)   . Povidone Other (See Comments)   . Promethazine hcl Other (See Comments)   . Tape  08/06/2011  . Clindamycin Hives and Rash 05/17/2008  . Iodine Rash 05/21/2012  . Morphine sulfate Nausea And Vomiting and Rash     1. Work and Family: She spends a lot of her time back and forth to her many physicians. 2. Activities: Same 3. Smoking, alcohol, or drugs: none 4. Primary Care Provider: Margaree Mackintosh, MD 5. Cardiologist: Dr. Rachelle Hora Croitoru 6. GYN: Dr. Osborn Coho 7. Oncologist: Dr. Glenford Peers retired. She has a new oncologist at  the [facility] 8. Nephrology: Camille Bal  REVIEW OF SYSTEMS: There are no other significant problems involving Lora's other body systems.   Objective:  Vital Signs:  BP 106/73  Pulse 76  Wt 332 lb 6.4 oz (150.776 kg)   Ht Readings from Last 3 Encounters:  10/06/13 5\' 7"  (1.702 m)  07/16/13 5\' 7"  (1.702 m)  06/17/13 5\' 7"  (1.702 m)   Wt Readings from Last 3 Encounters:  12/04/13 332 lb 6.4 oz (150.776 kg)  10/06/13 334 lb 9.6 oz (151.774 kg)  09/29/13 322 lb 8 oz (146.285 kg)    There is no height on file to calculate BSA.  PHYSICAL EXAM:  Constitutional: The patient appears healthy and well nourished. She is very alert and bright today. She looks much better than she did at last visit. She has gained 8 pounds since her visit in September.  Face: The face appears normal.  Eyes: There is no obvious arcus or proptosis. Moisture appears normal. Mouth: The oropharynx appears normal. Her mouth and tongue are  somewhat dry. Neck: The neck is visibly normal. No carotid bruits are noted. The thyroid gland is normal at 18-20 grams in size. The consistency of the thyroid gland is normal. The thyroid gland is tender to palpation today bilaterally, more on the right than on the left.  Lungs: The lungs are clear to auscultation. Air movement is good. Heart: Heart rate and rhythm are regular. Heart sounds S1 and S2 are normal.   Abdomen: The abdomen is quite enlarged. Bowel sounds are normal. There is no obvious hepatomegaly, splenomegaly, or other mass effect.  Arms: Muscle size and bulk are normal for age. Hands: She has a 1-2+ tremor of the right hand today and trace tremor of the left hand.  Phalangeal and metacarpophalangeal joints are normal. Her left wrist is tender to palpation. Palmar muscles are normal. Palmar skin is normal. Palmar moisture is also normal. Legs: Muscles appear normal for age. Leg edema is 2-3+. Lesions are healing. Feet: Feet are normally formed. DP pulses are faint 1+. She has 1-2+ pitting edema of the dorsa and toes.  Neurologic: Strength is normal for age in the arms, but decreased in the hip flexors. Sensation to touch is normal in both legs, but decreased in both heels.  LAB DATA:  Results for orders placed in visit on 12/04/13 (from the past 504 hour(s))  GLUCOSE, POCT (MANUAL RESULT ENTRY)   Collection Time    12/04/13 10:43 AM      Result Value Range   POC Glucose 179 (*) 70 - 99 mg/dl  Results for orders placed in visit on 11/18/13 (from the past 504 hour(s))  HEMOGLOBIN A1C   Collection Time    11/30/13  9:42 AM      Result Value  Range   Hemoglobin A1C 6.8 (*) <5.7 %   Mean Plasma Glucose 148 (*) <117 mg/dL  COMPREHENSIVE METABOLIC PANEL   Collection Time    11/30/13  9:42 AM      Result Value Range   Sodium 140  135 - 145 mEq/L   Potassium 3.6  3.5 - 5.3 mEq/L   Chloride 97  96 - 112 mEq/L   CO2 32  19 - 32 mEq/L   Glucose, Bld 140 (*) 70 - 99 mg/dL   BUN  55 (*) 6 - 23 mg/dL   Creat 1.61 (*) 0.96 - 1.10 mg/dL   Total Bilirubin 1.3 (*) 0.3 - 1.2 mg/dL   Alkaline Phosphatase 90  39 - 117 U/L   AST 19  0 - 37 U/L   ALT 18  0 - 35 U/L   Total Protein 7.1  6.0 - 8.3 g/dL   Albumin 3.5  3.5 - 5.2 g/dL   Calcium 9.1  8.4 - 04.5 mg/dL  LIPID PANEL   Collection Time    11/30/13  9:42 AM      Result Value Range   Cholesterol 149  0 - 200 mg/dL   Triglycerides 409  <811 mg/dL   HDL 33 (*) >91 mg/dL   Total CHOL/HDL Ratio 4.5     VLDL 27  0 - 40 mg/dL   LDL Cholesterol 89  0 - 99 mg/dL  MICROALBUMIN / CREATININE URINE RATIO   Collection Time    11/30/13  9:42 AM      Result Value Range   Microalb, Ur 1.53  0.00 - 1.89 mg/dL   Creatinine, Urine 47.8     Microalb Creat Ratio 47.4 (*) 0.0 - 30.0 mg/g  T3, FREE   Collection Time    11/30/13  9:42 AM      Result Value Range   T3, Free 2.3  2.3 - 4.2 pg/mL  T4, FREE   Collection Time    11/30/13  9:42 AM      Result Value Range   Free T4 1.58  0.80 - 1.80 ng/dL  TSH   Collection Time    11/30/13  9:42 AM      Result Value Range   TSH 2.831  0.350 - 4.500 uIU/mL  Labs 11/30/13: Her HbA1c was 6.8% in the lab, compared with 6.2% in the clinic at last visit and with 6.9% at the visit prior. The higher A1c reflects the higher  BGs that she has had during the past 2-3 months when she has been sick, when she's had to have cortisone injection and prednisone pills, and when her mobility has decreased. CMP showed a glucose of 140, potassium 3.6, creatinine 1.69; cholesterol 149, triglycerides 137, HDL 33, LDL 89; urinary microalbumin/creatinine ratio 47.4; TSH 2.831, free T4 1.58, free T3 2.3 Labs 08/21/13: TSH 1.703, free T4 1.81, free T3 2.1  Labs 08/13/13: 25-hydroxy vitamin d 52.8 (30-100), PTH 86 (15-65), urinary protein/creatinine ratio 256 ( <200) Labs 07/23/13: Hgb 12.8, Hct 39.2% Labs 07/20/13: BMP with calcium 9.0 Labs 05/05/13: TSH 1.791, free T4 1.86, free T3 2.3 on her current Synthroid dose  of 125 mcg/day.  Labs: 10/27/12: TSH 3.745, free T4 2.12, free T3 2.6; CMP: Cr 1.4; urinary microalbumin/creatinine concentration 89.6   Assessment and Plan:   ASSESSMENT:  1-2. Insulin-requiring T2DM/hypoglycemia:   A. The patient's HbA1c and BGs are higher, in large part due to her infections, cortisone injection, and prednisone. In retrospect, I would have increased  her Lantus at time of her cortisone injection and prednisone pills, if she had informed me that she was being given corticosteroids. Fortunately, in the past 24 hours her BGs have been better. If her AM BGs return to the 80-120 range in another week, we will not need to increase the Lantus dose. If the BGs do not normalize, however, she will need an increase in Lantus. If she runs out of Januvia she will also need an increase in Lantus.   B. She has not had any low BGs recently  Given her known problems with cardiac arrhythmias, she is at even greater risk of fatal arrhythmias if she develops profound hypoglycemia. For [name], a HbA1c goal of 6.5-7.5% is a much healthier goal.     3. A-fib: The arrhythmia comes and goes of its own volition. She is in sinus rhythm today.   4. Leg edema: Edema is better today, but still severe.  5. Hypertension: BPs are well within the normal range today.  6. Abdominal pains: Her abdominal pains have resolved.   7. Iron deficiency: She will continue to be followed by hematology-oncology.  8. Hypothyroidism/Thyroiditis: Her thyroiditis is again active clinically today. Her TFTs this month were at the lower end of the normal range. This could be due to prednisone effect, an amiodarone effect, or to losing more thyrocytes over time. We need to repeat her TFTs in 3 months.. 9. Fatigue: Her fatigue is better, but is still somewhat of a problem for her. Although she has had problems with anemia due to iron deficiency in the past, her labs in July showed that she was replete with iron, which may account for the  improvement in her fatigue. She has also remained euthyroid. The cause of her residual fatigue is unclear. 10. Diabetic peripheral neuropathy: Her sensation is about the same today, reflecting the improvement in BGs in the past 6 months.  If the BGs become too much higher, however, then her neuropathy will worsen. 11. Secondary hyperparathyroidism: Her calcium of 9.1 has increased slightly, but needs to be more in the 10.0 range. Her hyperparathyroidism is likely due to a combination of her CKD and a low calcium intake. Given her levels of PTH and 25-hydroxy vitamin D in August, and given the fact that she is taking calcitriol twice a day, I would expect her serum calcium to be in the upper half of the normal range. Since her serum calcium is only 9.0, it appears that her intake of calcium is deficient. She needs to increase her calcium intake.  PLAN:  1. Diagnostic: TFTs, PTH, calcium, and 25-hydroxy vitamin D prior to next visit.  Call on 12/13/13 to discuss BGs: 2. Therapeutic:   A. Watch salt intake tightly.   B. Continue Lantus dose of 27 units. Continue glyburide and Januvia at their current doses.  Follow the Very Small scale HS snack plan.  Will consider adjusting the Lantus doses as needed.  3. Patient education: Until she can lose significant amounts of weight, she will need to continue the insulin and oral agent therapies. Given her renal disease, she will need to have goodly amounts of vitamin D and calcium  4. Follow-up: 3 months  Level of Service: This visit lasted in excess of 60 minutes. More than 50% of the visit was devoted to counseling.  David Stall, MD 12/04/2013 11:06 AM

## 2013-12-07 ENCOUNTER — Telehealth: Payer: Self-pay

## 2013-12-07 NOTE — Telephone Encounter (Signed)
Left message for patient advising her to have her mammogram done before the end of the year.

## 2013-12-13 ENCOUNTER — Telehealth: Payer: Self-pay | Admitting: "Endocrinology

## 2013-12-13 NOTE — Telephone Encounter (Signed)
Received telephone call from [name]. 1. Overall status: BGs are bouncing around.  2. New problems: She started prednisone eye drops on 12/11/13 for an eye infection. She is also taking oral antibiotics. 3. Lantus dose: 27 units 4. Rapid-acting insulin: Glyburide 10 mg in AM and 5 mg PM and Januvia 100 mg/day 5. BG log: 2 AM, Breakfast, Lunch, Supper, Bedtime 12/11/13: 169, 140, 128, xxx, 130 12/12/13: xxx, 120, 122, 130, 128 12/13/13: xxx, 134, 159, 144 6. Assessment: Overall her BGs are better for her on her Lantus dose of 27 units, given her ASHD. She was having much more variability and lower BGs when her Lantus dose was 30. A Lantus dose of 28 units might be a bit better.  7. Plan: Try a Lantus dose of 28 units. 8. FU call: Wednesday evening. David Stall

## 2013-12-16 ENCOUNTER — Ambulatory Visit: Payer: Medicare Other | Admitting: Pharmacist Clinician (PhC)/ Clinical Pharmacy Specialist

## 2013-12-16 ENCOUNTER — Telehealth: Payer: Self-pay | Admitting: "Endocrinology

## 2013-12-16 NOTE — Telephone Encounter (Signed)
Received telephone call from patient. 1. Overall status: BGs have been 118-152 since increasing the Lantus dose. Her eye is better after taking the prednisone drops. She will finish the prednisone on 12/19/13. 2. New problems: none 3. Lantus dose: dose is now 28 units. We increased the dose due to concerns that her prednisone was causing higher BGs. 4. Glyburide 10 mg in AM and 5 mg in PM, Januvia 100 mg in AM 5. BG log: 2 AM, Breakfast, Lunch, Supper, Bedtime 12/14/13: xxx, 134, 128, 138, 144 12/15/13: xxx, 128, 118, 44, 142 12/16/13: xxx, 152, 134, 148, 127 6. Assessment: Overall the BGs are good, considering the fact that because she has ASHD we do not want her to have too many low BGs.  7. Plan: continue current plan. Be prepared to reduce the Lantus dose after the prednisone stops on 12/20 14.  8. FU call: next Monday evening Jemel Ono J

## 2014-01-07 ENCOUNTER — Encounter: Payer: Medicare Other | Admitting: Cardiovascular Disease

## 2014-01-08 ENCOUNTER — Other Ambulatory Visit: Payer: Self-pay | Admitting: *Deleted

## 2014-01-08 DIAGNOSIS — IMO0001 Reserved for inherently not codable concepts without codable children: Secondary | ICD-10-CM

## 2014-01-08 DIAGNOSIS — E1165 Type 2 diabetes mellitus with hyperglycemia: Principal | ICD-10-CM

## 2014-01-08 MED ORDER — GLYBURIDE 5 MG PO TABS: 5.0000 mg | ORAL_TABLET | Freq: Two times a day (BID) | ORAL | Status: DC

## 2014-01-18 ENCOUNTER — Other Ambulatory Visit: Payer: Self-pay | Admitting: *Deleted

## 2014-01-18 DIAGNOSIS — E1165 Type 2 diabetes mellitus with hyperglycemia: Principal | ICD-10-CM

## 2014-01-18 DIAGNOSIS — IMO0001 Reserved for inherently not codable concepts without codable children: Secondary | ICD-10-CM

## 2014-01-18 MED ORDER — GLYBURIDE 5 MG PO TABS: 5.0000 mg | ORAL_TABLET | Freq: Two times a day (BID) | ORAL | Status: DC

## 2014-01-20 ENCOUNTER — Telehealth: Payer: Self-pay | Admitting: Pharmacist Clinician (PhC)/ Clinical Pharmacy Specialist

## 2014-01-20 MED ORDER — COUMADIN 5 MG PO TABS: ORAL_TABLET | ORAL | Status: DC

## 2014-01-20 NOTE — Telephone Encounter (Signed)
Pt called and asked for refill on warfarin, has next appt for INR on 1/29 when sees MD.    Rx sent to [facility]

## 2014-01-22 ENCOUNTER — Other Ambulatory Visit (HOSPITAL_COMMUNITY): Payer: Medicare Other

## 2014-01-27 ENCOUNTER — Ambulatory Visit: Payer: Medicare Other

## 2014-01-27 ENCOUNTER — Ambulatory Visit (HOSPITAL_COMMUNITY): Payer: Medicare Other

## 2014-01-28 ENCOUNTER — Encounter (HOSPITAL_COMMUNITY): Payer: Self-pay

## 2014-01-28 ENCOUNTER — Encounter: Payer: Self-pay | Admitting: Cardiovascular Disease

## 2014-01-28 ENCOUNTER — Ambulatory Visit (INDEPENDENT_AMBULATORY_CARE_PROVIDER_SITE_OTHER): Payer: Medicare Other | Admitting: Cardiovascular Disease

## 2014-01-28 ENCOUNTER — Ambulatory Visit: Payer: Medicare Other

## 2014-01-28 ENCOUNTER — Other Ambulatory Visit: Payer: Self-pay | Admitting: *Deleted

## 2014-01-28 VITALS — BP 118/70 | HR 88 | Ht 67.0 in | Wt 346.5 lb

## 2014-01-28 DIAGNOSIS — R5383 Other fatigue: Secondary | ICD-10-CM

## 2014-01-28 DIAGNOSIS — I4891 Unspecified atrial fibrillation: Secondary | ICD-10-CM

## 2014-01-28 DIAGNOSIS — R5381 Other malaise: Secondary | ICD-10-CM

## 2014-01-28 DIAGNOSIS — I4819 Other persistent atrial fibrillation: Secondary | ICD-10-CM

## 2014-01-28 DIAGNOSIS — Z79899 Other long term (current) drug therapy: Secondary | ICD-10-CM

## 2014-01-28 DIAGNOSIS — D689 Coagulation defect, unspecified: Secondary | ICD-10-CM

## 2014-01-28 LAB — PACEMAKER DEVICE OBSERVATION

## 2014-01-28 MED ORDER — METOLAZONE 5 MG PO TABS: 5.0000 mg | ORAL_TABLET | Freq: Every day | ORAL | Status: DC | PRN

## 2014-01-28 MED ORDER — NITROGLYCERIN 0.4 MG/HR TD PT24: 0.4000 mg | MEDICATED_PATCH | TRANSDERMAL | Status: DC

## 2014-01-28 MED ORDER — ALPRAZOLAM 0.25 MG PO TABS: 0.2500 mg | ORAL_TABLET | Freq: Every evening | ORAL | Status: DC | PRN

## 2014-01-28 MED ORDER — AMIODARONE HCL 100 MG PO TABS: 300.0000 mg | ORAL_TABLET | Freq: Two times a day (BID) | ORAL | Status: DC

## 2014-01-28 NOTE — Patient Instructions (Signed)
Increase Amiodarone 100mg  to 3 tablets twice a day.  A new prescription has been sent to your pharmacy.  A prescription has been sent to your pharmacy for Xanax 0.25mg  as needed.  Your physician has recommended that you have a Cardioversion (DCCV). Electrical Cardioversion uses a jolt of electricity to your heart either through paddles or wired patches attached to your chest. This is a controlled, usually prescheduled, procedure. Defibrillation is done under light anesthesia in the hospital, and you usually go home the day of the procedure. This is done to get your heart back into a normal rhythm. You are not awake for the procedure. Please see the instruction sheet given to you today.

## 2014-01-28 NOTE — Progress Notes (Signed)
Patient ID: [name], female   DOB: Sep 24, 1950, 64 y.o.   MRN: [id number]      Reason for office visit Atrial fibrillation, congestive heart failure, complete heart block, pacemaker, nonischemic cardiomyopathy  [name] has been having increasing difficulty with worsening lower extremity edema that has not responded to increased doses of diuretics including metolazone. She developed a couple of skin blisters on her shins that burst. Interrogation of her pacemaker shows an episode of persistent atrial fibrillation that began 54 days ago and is ongoing. She has 100% ventricular pacing, with a regular rhythm. He last required cardioversion in February of 2014 successfully maintained sinus rhythm for almost a year. She has been therapeutically anticoagulated and today her INR is 2.0. Pacemaker function is otherwise normal. She has severe mitral insufficiency and severe tricuspid insufficiency. She is not felt to be a candidate for surgical valve repair secondary to her multiple comorbid conditions and severe obesity.  Because of her fairly volatile renal insufficiency, she's not receiving treatment with ACE inhibitors or angiotensin receptor blockers. She has told me on a couple of occasions that she will not to go on hemodialysis under any circumstances.  She was planning to undergo a right eye vitrectomy for deteriorating vision, but eventually this was deemed not to be necessary.   Allergies  Allergen Reactions  . Ivp Dye [Iodinated Diagnostic Agents] Shortness Of Breath    Turn red, can't breathe  . Betadine [Povidone Iodine]   . Diphenhydramine Hcl Swelling    In hands and eyes  . Fish Allergy Nausea And Vomiting  . Iohexol      Desc: PT TURNS RED AND WHEEZING   . Penicillins Other (See Comments)    Resp arrest as child  . Povidone-Iodine Other (See Comments)    Wheezing, turn red, can't breath, and blisters  . Promethazine Hcl Other (See Comments)    Low Blood Pressure  . Tape       Blisters, can use paper tape for short periods  . Clindamycin Hives and Rash    wheezing  . Iodine Rash  . Morphine Sulfate Nausea And Vomiting and Rash    Current Outpatient Prescriptions  Medication Sig Dispense Refill  . albuterol (PROVENTIL HFA;VENTOLIN HFA) 108 (90 BASE) MCG/ACT inhaler Inhale 2 puffs into the lungs every 6 (six) hours as needed. For shortness of breath      . albuterol (PROVENTIL) (2.5 MG/3ML) 0.083% nebulizer solution Take 2.5 mg by nebulization every 6 (six) hours as needed. For shortness of breath      . amiodarone (PACERONE) 100 MG tablet Take 2 tablets (200 mg total) by mouth 2 (two) times daily.  180 tablet  5  . calcitRIOL (ROCALTROL) 0.25 MCG capsule Take 0.25 mcg by mouth 2 (two) times daily.      . calcium citrate-vitamin D 200-200 MG-UNIT TABS Take 1 tablet by mouth 4 (four) times daily -  before meals and at bedtime.      Marland Kitchen COUMADIN 5 MG tablet Take 1 tablet by mouth daily or as directed  90 tablet  1  . digoxin (LANOXIN) 0.125 MG tablet Take 0.5 tablets (0.0625 mg total) by mouth daily.  15 tablet  10  . glyBURIDE (DIABETA) 5 MG tablet Take 1-2 tablets (5-10 mg total) by mouth 2 (two) times daily with a meal. Take 10mg  in the morning and 5mg  at night  120 tablet  4  . HYDROcodone-acetaminophen (NORCO) 10-325 MG per tablet Take 1 tablet by mouth 2 (  two) times daily as needed for pain.  90 tablet  5  . insulin glargine (LANTUS) 100 UNIT/ML injection Use up to 27 units daily      . Insulin Pen Needle (B-D UF III MINI PEN NEEDLES) 31G X 5 MM MISC Check blood sugar up to 6x daily  200 each  3  . iron polysaccharides (NIFEREX) 150 MG capsule Take 150 mg by mouth 2 (two) times daily.      Marland Kitchen levothyroxine (SYNTHROID, LEVOTHROID) 125 MCG tablet Take 125 mcg by mouth daily before breakfast.      . metolazone (ZAROXOLYN) 5 MG tablet Take 1 tablet (5 mg total) by mouth daily as needed.  30 tablet  11  . metoprolol tartrate (LOPRESSOR) 25 MG tablet Take 1.5 tablets  (37.5 mg total) by mouth 2 (two) times daily.  90 tablet  11  . Multiple Vitamin (MULTIVITAMIN) tablet Take 1 tablet by mouth daily.       . nitroGLYCERIN (NITRODUR - DOSED IN MG/24 HR) 0.4 mg/hr patch Place 1 patch (0.4 mg total) onto the skin every other day.  30 patch  6  . nitroGLYCERIN (NITROSTAT) 0.4 MG SL tablet Place 0.4 mg under the tongue every 5 (five) minutes as needed. For chest pain      . ONE TOUCH ULTRA TEST test strip       . potassium chloride SA (K-DUR,KLOR-CON) 20 MEQ tablet Take 1 tablet (20 mEq total) by mouth 3 (three) times daily.  90 tablet  11  . sitaGLIPtin (JANUVIA) 100 MG tablet Take 100 mg by mouth daily.      Marland Kitchen tiotropium (SPIRIVA) 18 MCG inhalation capsule Place 18 mcg into inhaler and inhale daily.      Marland Kitchen torsemide (DEMADEX) 100 MG tablet Take 0.5 tablets (50 mg total) by mouth 2 (two) times daily.  30 tablet  10  . Vitamin D, Ergocalciferol, (DRISDOL) 50000 UNITS CAPS Take 50,000 Units by mouth 2 (two) times a week. Takes on [place]       No current facility-administered medications for this visit.    Past Medical History  Diagnosis Date  . Personal history of other diseases of circulatory system   . 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 Chronic lymphocytic thyroiditis   . Morbid obesity   . Other chronic pulmonary heart diseases   . Obstructive sleep apnea (adult) (pediatric)   . Other and unspecified hyperlipidemia   . Fibromyalgia   . Goiter   . Thyroiditis, autoimmune   . Hypothyroidism, acquired, autoimmune   . Solitary kidney, acquired   . Fatigue   . Hyperparathyroidism, secondary   . Vitamin D deficiency disease   . Hyperparathyroidism   . H/O varicose veins 03/27/12  . Type II or unspecified type diabetes mellitus without mention of complication, not stated as uncontrolled   . Diabetes mellitus type II   . Infections of kidney 03/27/12  . History of measles, mumps, or rubella 03/27/12  . Tubal pregnancy  12/23/76  . Unspecified essential hypertension     20 yrs ago  . Blood transfusion 1977    [facility]  . Complication of anesthesia 2010    hard to wake up after knee surgery  . Anemia   . Cardiomyopathy, nonischemic     EF 45 %  . Endometrial hyperplasia with atypia 05/05/12  . Diastolic HF (heart failure) 11/05/2012  . A-fib   . Echocardiogram abnormal 07/15/12    severe  MR,mod to severe TR,mod to severe PA hypertension  . Pulmonary arterial hypertension   . PAF (paroxysmal atrial fibrillation), 1st episode since DCCV in Jan/14 03/19/2013  . Ulcers of both lower extremities, small, now with cellulitis 06/05/2013    Past Surgical History  Procedure Laterality Date  . Cholecystectomy    . Pacemaker placement  02/26/11    Medtronic Adapta  . Knee surgery    . Resection duplicate left kidney    . 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 Hernia repair  1999    ruptured ;  . D& c with hysteroscopy & cervical polypectomy  05/05/12  . Cardioversion N/A 02/06/2013    Successful  . Iud removal  01/30/13  . Ectopic pregnancy surgery  1978  . Cardioversion N/A 06/08/2013    Procedure: CARDIOVERSION;  Surgeon: Sanda Klein, MD;  Location: [facility] OR;  Service: Cardiovascular;  Laterality: N/A;  Room [phone]    Family History  Problem Relation Age of Onset  . Cardiomyopathy Brother   . Diabetes Mother   . Cancer Maternal Grandmother   . Heart attack Mother     History   Social History  . Marital Status: Married    Spouse Name: N/A    Number of Children: N/A  . Years of Education: N/A   Occupational History  . retired Building services engineer    Social History Main Topics  . Smoking status: Never Smoker   . Smokeless tobacco: Never Used  . Alcohol Use: No  . Drug Use: No  . Sexual Activity: Yes    Partners: Male    Birth Control/ Protection: Post-menopausal   Other Topics Concern  . Not on file   Social History Narrative  . No narrative on file    Review of systems: She has chronic  lower extremity edema that waxes and wanes in severity. The ulcers on her legs seem to be healing although there is intermittent redness. She has no change in her chronic shortness of breath NYHA functional class III. She denies orthopnea and PND. She has not had syncope or palpitations. She denies angina, bleeding problems, focal neurological deficits, mood problems, unexpected weight changes, abdominal pain or change in bowel habits, polyuria, polydipsia frequency urgency hematuria cough hemoptysis or wheezing   PHYSICAL EXAM BP 118/70  Pulse 88  Ht 5\' 7"  (1.702 m)  Wt 157.171 kg (346 lb 8 oz)  BMI 54.26 kg/m2  PHYSICAL EXAM  BP 128/80  Pulse 71  Resp 16  Ht 5\' 7"  (1.702 m)  Wt 334 lb 9.6 oz (151.774 kg)  BMI 52.39 kg/m2  General appearance: alert, cooperative, no distress and morbidly obese  Neck: no adenopathy, no carotid bruit, supple, symmetrical, trachea midline, thyroid not enlarged, symmetric, no tenderness/mass/nodules and It difficult to see jugular venous pulsations due to obesity  Lungs: clear to auscultation bilaterally  Heart: regular rate and rhythm, S1: split, S2: wide splitting, S3 present and systolic murmur: holosystolic 2/6, blowing at apex  Abdomen: soft, non-tender; bowel sounds normal; no masses, no organomegaly  Extremities: edema 3+ symmetrically bilaterally and Multiple 2-3 mm ulcers with crusts bilaterally  Pulses: 2+ and symmetric  Skin: Ulcers as described above, no evidence of cellulitis  Neurologic: Alert and oriented X 3, normal strength and tone. Normal symmetric reflexes. Normal coordination and gait    EKG: 100% ventricular paced  Lipid Panel     Component Value Date/Time   CHOL 149 11/30/2013 0942   TRIG 137 11/30/2013 0942  HDL 33* 11/30/2013 0942   CHOLHDL 4.5 11/30/2013 0942   VLDL 27 11/30/2013 0942   LDLCALC 89 11/30/2013 0942    BMET    Component Value Date/Time   NA 140 11/30/2013 0942   K 3.6 11/30/2013 0942   CL 97 11/30/2013 0942    CO2 32 11/30/2013 0942   GLUCOSE 140* 11/30/2013 0942   BUN 55* 11/30/2013 0942   CREATININE 1.69* 11/30/2013 0942   CREATININE 2.19* 09/21/2013 1002   CREATININE 1.28* 02/12/2011 1000   CALCIUM 9.1 11/30/2013 0942   CALCIUM 9.5 03/24/2012 1212   GFRNONAA 22* 09/21/2013 1002   GFRAA 26* 09/21/2013 1002     ASSESSMENT AND PLAN  Decompensated biventricular failure, right greater than left, secondary to persistent atrial fibrillation.  Increase amiodarone to a total of 600 mg a day. Schedule for cardioversion next week. Will need uninterrupted anticoagulation for 30 days following the cardioversion, without fail, barring any unexpected bleeding complications. Check labs for electrolyte abnormalities. Normal thyroid function studies last month.  This procedure has been fully reviewed with the patient and written informed consent has been obtained.   Meds ordered this encounter  Medications  . DISCONTD: metolazone (ZAROXOLYN) 5 MG tablet    Sig: Take 5 mg by mouth daily as needed.  . metolazone (ZAROXOLYN) 5 MG tablet    Sig: Take 1 tablet (5 mg total) by mouth daily as needed.    Dispense:  30 tablet    Refill:  11  . nitroGLYCERIN (NITRODUR - DOSED IN MG/24 HR) 0.4 mg/hr patch    Sig: Place 1 patch (0.4 mg total) onto the skin every other day.    Dispense:  30 patch    Refill:  [place] Chaia Ikard, MD, [facility] [phone] office [phone] pager

## 2014-01-29 LAB — COMPREHENSIVE METABOLIC PANEL
ALK PHOS: 88 U/L (ref 39–117)
ALT: 15 U/L (ref 0–35)
AST: 18 U/L (ref 0–37)
Albumin: 3.6 g/dL (ref 3.5–5.2)
BUN: 70 mg/dL — AB (ref 6–23)
CALCIUM: 9 mg/dL (ref 8.4–10.5)
CHLORIDE: 93 meq/L — AB (ref 96–112)
CO2: 34 mEq/L — ABNORMAL HIGH (ref 19–32)
CREATININE: 1.96 mg/dL — AB (ref 0.50–1.10)
Glucose, Bld: 111 mg/dL — ABNORMAL HIGH (ref 70–99)
Potassium: 3.4 mEq/L — ABNORMAL LOW (ref 3.5–5.3)
Sodium: 138 mEq/L (ref 135–145)
Total Bilirubin: 1.7 mg/dL — ABNORMAL HIGH (ref 0.2–1.2)
Total Protein: 7.4 g/dL (ref 6.0–8.3)

## 2014-01-29 LAB — MDC_IDC_ENUM_SESS_TYPE_INCLINIC
Battery Remaining Longevity: 4.5
Battery Voltage: 2.78 V
Brady Statistic AP VP Percent: 82.8 %
Brady Statistic AS VP Percent: 16.5 %
Lead Channel Impedance Value: 324 Ohm
Lead Channel Impedance Value: 341 Ohm
Lead Channel Pacing Threshold Amplitude: 1 V
Lead Channel Pacing Threshold Amplitude: 1.5 V
Lead Channel Pacing Threshold Pulse Width: 0.4 ms
Lead Channel Pacing Threshold Pulse Width: 0.76 ms
Lead Channel Sensing Intrinsic Amplitude: 0.25 mV
Lead Channel Sensing Intrinsic Amplitude: 8 mV
Lead Channel Setting Pacing Amplitude: 3 V
Lead Channel Setting Pacing Amplitude: 3.25 V
Lead Channel Setting Sensing Sensitivity: 0.18 mV
MDC IDC MSMT BATTERY IMPEDANCE: 302 Ohm
MDC IDC MSMT LEADCHNL RV PACING THRESHOLD AMPLITUDE: 1.25 V
MDC IDC MSMT LEADCHNL RV PACING THRESHOLD PULSEWIDTH: 0.64 ms
MDC IDC SET LEADCHNL RV PACING PULSEWIDTH: 0.4 ms
MDC IDC STAT BRADY AP VS PERCENT: 0.2 %
MDC IDC STAT BRADY AS VS PERCENT: 0.4 %

## 2014-01-29 LAB — CBC
HCT: 34.8 % — ABNORMAL LOW (ref 36.0–46.0)
HEMOGLOBIN: 11.7 g/dL — AB (ref 12.0–15.0)
MCH: 31 pg (ref 26.0–34.0)
MCHC: 33.6 g/dL (ref 30.0–36.0)
MCV: 92.3 fL (ref 78.0–100.0)
Platelets: 178 10*3/uL (ref 150–400)
RBC: 3.77 MIL/uL — ABNORMAL LOW (ref 3.87–5.11)
RDW: 16.4 % — ABNORMAL HIGH (ref 11.5–15.5)
WBC: 5.7 10*3/uL (ref 4.0–10.5)

## 2014-01-29 LAB — PROTIME-INR
INR: 1.93 — ABNORMAL HIGH (ref <1.50)
Prothrombin Time: 21.6 seconds — ABNORMAL HIGH (ref 11.6–15.2)

## 2014-01-29 LAB — APTT: APTT: 37 s (ref 24–37)

## 2014-01-30 ENCOUNTER — Other Ambulatory Visit (HOSPITAL_COMMUNITY): Payer: Self-pay | Admitting: Hematology and Oncology

## 2014-02-01 ENCOUNTER — Encounter (HOSPITAL_COMMUNITY): Payer: Self-pay | Admitting: *Deleted

## 2014-02-01 ENCOUNTER — Ambulatory Visit (HOSPITAL_COMMUNITY)
Admission: RE | Admit: 2014-02-01 | Discharge: 2014-02-01 | Disposition: A | Discharge status: Home / Self Care | Payer: Medicare Other | Source: Ambulatory Visit | Attending: Cardiovascular Disease | Admitting: Cardiovascular Disease

## 2014-02-01 ENCOUNTER — Ambulatory Visit (HOSPITAL_COMMUNITY): Payer: Medicare Other | Admitting: Anesthesiology

## 2014-02-01 ENCOUNTER — Other Ambulatory Visit (HOSPITAL_COMMUNITY): Payer: Self-pay

## 2014-02-01 ENCOUNTER — Other Ambulatory Visit: Payer: Self-pay | Admitting: *Deleted

## 2014-02-01 ENCOUNTER — Encounter (HOSPITAL_COMMUNITY): Payer: Medicare Other | Admitting: Anesthesiology

## 2014-02-01 ENCOUNTER — Encounter (HOSPITAL_COMMUNITY)
Admission: RE | Disposition: A | Discharge status: Home / Self Care | Payer: Self-pay | Source: Ambulatory Visit | Attending: Cardiovascular Disease

## 2014-02-01 DIAGNOSIS — E119 Type 2 diabetes mellitus without complications: Secondary | ICD-10-CM | POA: Insufficient documentation

## 2014-02-01 DIAGNOSIS — Z794 Long term (current) use of insulin: Secondary | ICD-10-CM | POA: Insufficient documentation

## 2014-02-01 DIAGNOSIS — I442 Atrioventricular block, complete: Secondary | ICD-10-CM | POA: Insufficient documentation

## 2014-02-01 DIAGNOSIS — Z79899 Other long term (current) drug therapy: Secondary | ICD-10-CM | POA: Insufficient documentation

## 2014-02-01 DIAGNOSIS — G4733 Obstructive sleep apnea (adult) (pediatric): Secondary | ICD-10-CM | POA: Insufficient documentation

## 2014-02-01 DIAGNOSIS — D472 Monoclonal gammopathy: Secondary | ICD-10-CM

## 2014-02-01 DIAGNOSIS — I079 Rheumatic tricuspid valve disease, unspecified: Secondary | ICD-10-CM | POA: Insufficient documentation

## 2014-02-01 DIAGNOSIS — I4891 Unspecified atrial fibrillation: Secondary | ICD-10-CM | POA: Insufficient documentation

## 2014-02-01 DIAGNOSIS — I059 Rheumatic mitral valve disease, unspecified: Secondary | ICD-10-CM | POA: Insufficient documentation

## 2014-02-01 DIAGNOSIS — I509 Heart failure, unspecified: Secondary | ICD-10-CM | POA: Insufficient documentation

## 2014-02-01 DIAGNOSIS — Z950 Presence of cardiac pacemaker: Secondary | ICD-10-CM | POA: Insufficient documentation

## 2014-02-01 DIAGNOSIS — E669 Obesity, unspecified: Secondary | ICD-10-CM

## 2014-02-01 DIAGNOSIS — I428 Other cardiomyopathies: Secondary | ICD-10-CM | POA: Insufficient documentation

## 2014-02-01 HISTORY — PX: CARDIOVERSION: SHX1299

## 2014-02-01 LAB — PROTIME-INR
INR: 2.71 — ABNORMAL HIGH (ref 0.00–1.49)
Prothrombin Time: 27.8 seconds — ABNORMAL HIGH (ref 11.6–15.2)

## 2014-02-01 SURGERY — CARDIOVERSION
Anesthesia: General

## 2014-02-01 MED ORDER — SODIUM CHLORIDE 0.9 % IV SOLN
INTRAVENOUS | Status: DC
  Administered 2014-02-01: 10:00:00 via INTRAVENOUS

## 2014-02-01 MED ORDER — LIDOCAINE HCL (CARDIAC) 20 MG/ML IV SOLN
INTRAVENOUS | Status: DC | PRN
  Administered 2014-02-01: 60 mg via INTRAVENOUS

## 2014-02-01 MED ORDER — SODIUM CHLORIDE 0.9 % IV SOLN: INTRAVENOUS | Status: DC

## 2014-02-01 MED ORDER — PROPOFOL 10 MG/ML IV BOLUS
INTRAVENOUS | Status: DC | PRN
  Administered 2014-02-01: 110 mg via INTRAVENOUS

## 2014-02-01 NOTE — Addendum Note (Signed)
Addendum created 02/01/14 1225 by Terrill Mohr, CRNA   Modules edited: Anesthesia Events, Anesthesia Flowsheet

## 2014-02-01 NOTE — H&P (View-Only) (Signed)
Patient ID: [name], female   DOB: Sep 24, 1950, 64 y.o.   MRN: [id number]      Reason for office visit Atrial fibrillation, congestive heart failure, complete heart block, pacemaker, nonischemic cardiomyopathy  [name] has been having increasing difficulty with worsening lower extremity edema that has not responded to increased doses of diuretics including metolazone. She developed a couple of skin blisters on her shins that burst. Interrogation of her pacemaker shows an episode of persistent atrial fibrillation that began 54 days ago and is ongoing. She has 100% ventricular pacing, with a regular rhythm. He last required cardioversion in February of 2014 successfully maintained sinus rhythm for almost a year. She has been therapeutically anticoagulated and today her INR is 2.0. Pacemaker function is otherwise normal. She has severe mitral insufficiency and severe tricuspid insufficiency. She is not felt to be a candidate for surgical valve repair secondary to her multiple comorbid conditions and severe obesity.  Because of her fairly volatile renal insufficiency, she's not receiving treatment with ACE inhibitors or angiotensin receptor blockers. She has told me on a couple of occasions that she will not to go on hemodialysis under any circumstances.  She was planning to undergo a right eye vitrectomy for deteriorating vision, but eventually this was deemed not to be necessary.   Allergies  Allergen Reactions  . Ivp Dye [Iodinated Diagnostic Agents] Shortness Of Breath    Turn red, can't breathe  . Betadine [Povidone Iodine]   . Diphenhydramine Hcl Swelling    In hands and eyes  . Fish Allergy Nausea And Vomiting  . Iohexol      Desc: PT TURNS RED AND WHEEZING   . Penicillins Other (See Comments)    Resp arrest as child  . Povidone-Iodine Other (See Comments)    Wheezing, turn red, can't breath, and blisters  . Promethazine Hcl Other (See Comments)    Low Blood Pressure  . Tape       Blisters, can use paper tape for short periods  . Clindamycin Hives and Rash    wheezing  . Iodine Rash  . Morphine Sulfate Nausea And Vomiting and Rash    Current Outpatient Prescriptions  Medication Sig Dispense Refill  . albuterol (PROVENTIL HFA;VENTOLIN HFA) 108 (90 BASE) MCG/ACT inhaler Inhale 2 puffs into the lungs every 6 (six) hours as needed. For shortness of breath      . albuterol (PROVENTIL) (2.5 MG/3ML) 0.083% nebulizer solution Take 2.5 mg by nebulization every 6 (six) hours as needed. For shortness of breath      . amiodarone (PACERONE) 100 MG tablet Take 2 tablets (200 mg total) by mouth 2 (two) times daily.  180 tablet  5  . calcitRIOL (ROCALTROL) 0.25 MCG capsule Take 0.25 mcg by mouth 2 (two) times daily.      . calcium citrate-vitamin D 200-200 MG-UNIT TABS Take 1 tablet by mouth 4 (four) times daily -  before meals and at bedtime.      Marland Kitchen COUMADIN 5 MG tablet Take 1 tablet by mouth daily or as directed  90 tablet  1  . digoxin (LANOXIN) 0.125 MG tablet Take 0.5 tablets (0.0625 mg total) by mouth daily.  15 tablet  10  . glyBURIDE (DIABETA) 5 MG tablet Take 1-2 tablets (5-10 mg total) by mouth 2 (two) times daily with a meal. Take 10mg  in the morning and 5mg  at night  120 tablet  4  . HYDROcodone-acetaminophen (NORCO) 10-325 MG per tablet Take 1 tablet by mouth 2 (  two) times daily as needed for pain.  90 tablet  5  . insulin glargine (LANTUS) 100 UNIT/ML injection Use up to 27 units daily      . Insulin Pen Needle (B-D UF III MINI PEN NEEDLES) 31G X 5 MM MISC Check blood sugar up to 6x daily  200 each  3  . iron polysaccharides (NIFEREX) 150 MG capsule Take 150 mg by mouth 2 (two) times daily.      Marland Kitchen levothyroxine (SYNTHROID, LEVOTHROID) 125 MCG tablet Take 125 mcg by mouth daily before breakfast.      . metolazone (ZAROXOLYN) 5 MG tablet Take 1 tablet (5 mg total) by mouth daily as needed.  30 tablet  11  . metoprolol tartrate (LOPRESSOR) 25 MG tablet Take 1.5 tablets  (37.5 mg total) by mouth 2 (two) times daily.  90 tablet  11  . Multiple Vitamin (MULTIVITAMIN) tablet Take 1 tablet by mouth daily.       . nitroGLYCERIN (NITRODUR - DOSED IN MG/24 HR) 0.4 mg/hr patch Place 1 patch (0.4 mg total) onto the skin every other day.  30 patch  6  . nitroGLYCERIN (NITROSTAT) 0.4 MG SL tablet Place 0.4 mg under the tongue every 5 (five) minutes as needed. For chest pain      . ONE TOUCH ULTRA TEST test strip       . potassium chloride SA (K-DUR,KLOR-CON) 20 MEQ tablet Take 1 tablet (20 mEq total) by mouth 3 (three) times daily.  90 tablet  11  . sitaGLIPtin (JANUVIA) 100 MG tablet Take 100 mg by mouth daily.      Marland Kitchen tiotropium (SPIRIVA) 18 MCG inhalation capsule Place 18 mcg into inhaler and inhale daily.      Marland Kitchen torsemide (DEMADEX) 100 MG tablet Take 0.5 tablets (50 mg total) by mouth 2 (two) times daily.  30 tablet  10  . Vitamin D, Ergocalciferol, (DRISDOL) 50000 UNITS CAPS Take 50,000 Units by mouth 2 (two) times a week. Takes on [place]       No current facility-administered medications for this visit.    Past Medical History  Diagnosis Date  . Personal history of other diseases of circulatory system   . 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 Chronic lymphocytic thyroiditis   . Morbid obesity   . Other chronic pulmonary heart diseases   . Obstructive sleep apnea (adult) (pediatric)   . Other and unspecified hyperlipidemia   . Fibromyalgia   . Goiter   . Thyroiditis, autoimmune   . Hypothyroidism, acquired, autoimmune   . Solitary kidney, acquired   . Fatigue   . Hyperparathyroidism, secondary   . Vitamin D deficiency disease   . Hyperparathyroidism   . H/O varicose veins 03/27/12  . Type II or unspecified type diabetes mellitus without mention of complication, not stated as uncontrolled   . Diabetes mellitus type II   . Infections of kidney 03/27/12  . History of measles, mumps, or rubella 03/27/12  . Tubal pregnancy  12/23/76  . Unspecified essential hypertension     20 yrs ago  . Blood transfusion 1977    [facility]  . Complication of anesthesia 2010    hard to wake up after knee surgery  . Anemia   . Cardiomyopathy, nonischemic     EF 45 %  . Endometrial hyperplasia with atypia 05/05/12  . Diastolic HF (heart failure) 11/05/2012  . A-fib   . Echocardiogram abnormal 07/15/12    severe  MR,mod to severe TR,mod to severe PA hypertension  . Pulmonary arterial hypertension   . PAF (paroxysmal atrial fibrillation), 1st episode since DCCV in Jan/14 03/19/2013  . Ulcers of both lower extremities, small, now with cellulitis 06/05/2013    Past Surgical History  Procedure Laterality Date  . Cholecystectomy    . Pacemaker placement  02/26/11    Medtronic Adapta  . Knee surgery    . Resection duplicate left kidney    . 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 Hernia repair  1999    ruptured ;  . D& c with hysteroscopy & cervical polypectomy  05/05/12  . Cardioversion N/A 02/06/2013    Successful  . Iud removal  01/30/13  . Ectopic pregnancy surgery  1978  . Cardioversion N/A 06/08/2013    Procedure: CARDIOVERSION;  Surgeon: Sanda Klein, MD;  Location: [facility] OR;  Service: Cardiovascular;  Laterality: N/A;  Room [phone]    Family History  Problem Relation Age of Onset  . Cardiomyopathy Brother   . Diabetes Mother   . Cancer Maternal Grandmother   . Heart attack Mother     History   Social History  . Marital Status: Married    Spouse Name: N/A    Number of Children: N/A  . Years of Education: N/A   Occupational History  . retired Building services engineer    Social History Main Topics  . Smoking status: Never Smoker   . Smokeless tobacco: Never Used  . Alcohol Use: No  . Drug Use: No  . Sexual Activity: Yes    Partners: Male    Birth Control/ Protection: Post-menopausal   Other Topics Concern  . Not on file   Social History Narrative  . No narrative on file    Review of systems: She has chronic  lower extremity edema that waxes and wanes in severity. The ulcers on her legs seem to be healing although there is intermittent redness. She has no change in her chronic shortness of breath NYHA functional class III. She denies orthopnea and PND. She has not had syncope or palpitations. She denies angina, bleeding problems, focal neurological deficits, mood problems, unexpected weight changes, abdominal pain or change in bowel habits, polyuria, polydipsia frequency urgency hematuria cough hemoptysis or wheezing   PHYSICAL EXAM BP 118/70  Pulse 88  Ht 5\' 7"  (1.702 m)  Wt 157.171 kg (346 lb 8 oz)  BMI 54.26 kg/m2  PHYSICAL EXAM  BP 128/80  Pulse 71  Resp 16  Ht 5\' 7"  (1.702 m)  Wt 334 lb 9.6 oz (151.774 kg)  BMI 52.39 kg/m2  General appearance: alert, cooperative, no distress and morbidly obese  Neck: no adenopathy, no carotid bruit, supple, symmetrical, trachea midline, thyroid not enlarged, symmetric, no tenderness/mass/nodules and It difficult to see jugular venous pulsations due to obesity  Lungs: clear to auscultation bilaterally  Heart: regular rate and rhythm, S1: split, S2: wide splitting, S3 present and systolic murmur: holosystolic 2/6, blowing at apex  Abdomen: soft, non-tender; bowel sounds normal; no masses, no organomegaly  Extremities: edema 3+ symmetrically bilaterally and Multiple 2-3 mm ulcers with crusts bilaterally  Pulses: 2+ and symmetric  Skin: Ulcers as described above, no evidence of cellulitis  Neurologic: Alert and oriented X 3, normal strength and tone. Normal symmetric reflexes. Normal coordination and gait    EKG: 100% ventricular paced  Lipid Panel     Component Value Date/Time   CHOL 149 11/30/2013 0942   TRIG 137 11/30/2013 0942  HDL 33* 11/30/2013 0942   CHOLHDL 4.5 11/30/2013 0942   VLDL 27 11/30/2013 0942   LDLCALC 89 11/30/2013 0942    BMET    Component Value Date/Time   NA 140 11/30/2013 0942   K 3.6 11/30/2013 0942   CL 97 11/30/2013 0942    CO2 32 11/30/2013 0942   GLUCOSE 140* 11/30/2013 0942   BUN 55* 11/30/2013 0942   CREATININE 1.69* 11/30/2013 0942   CREATININE 2.19* 09/21/2013 1002   CREATININE 1.28* 02/12/2011 1000   CALCIUM 9.1 11/30/2013 0942   CALCIUM 9.5 03/24/2012 1212   GFRNONAA 22* 09/21/2013 1002   GFRAA 26* 09/21/2013 1002     ASSESSMENT AND PLAN  Decompensated biventricular failure, right greater than left, secondary to persistent atrial fibrillation.  Increase amiodarone to a total of 600 mg a day. Schedule for cardioversion next week. Will need uninterrupted anticoagulation for 30 days following the cardioversion, without fail, barring any unexpected bleeding complications. Check labs for electrolyte abnormalities. Normal thyroid function studies last month.  This procedure has been fully reviewed with the patient and written informed consent has been obtained.   Meds ordered this encounter  Medications  . DISCONTD: metolazone (ZAROXOLYN) 5 MG tablet    Sig: Take 5 mg by mouth daily as needed.  . metolazone (ZAROXOLYN) 5 MG tablet    Sig: Take 1 tablet (5 mg total) by mouth daily as needed.    Dispense:  30 tablet    Refill:  11  . nitroGLYCERIN (NITRODUR - DOSED IN MG/24 HR) 0.4 mg/hr patch    Sig: Place 1 patch (0.4 mg total) onto the skin every other day.    Dispense:  30 patch    Refill:  [place] Casidee Jann, MD, [facility] [phone] office [phone] pager

## 2014-02-01 NOTE — CV Procedure (Signed)
Procedure: Electrical Cardioversion Indications:  Atrial Fibrillation  Procedure Details:  Consent: Risks of procedure as well as the alternatives and risks of each were explained to the (patient/caregiver).  Consent for procedure obtained.  Time Out: Verified patient identification, verified procedure, site/side was marked, verified correct patient position, special equipment/implants available, medications/allergies/relevent history reviewed, required imaging and test results available.  Performed  Patient placed on cardiac monitor, pulse oximetry, supplemental oxygen as necessary.  Sedation given: Propofol 110 mg IV, Dr. Gwenyth Bouillon Pacer pads placed anterior and posterior chest.  Cardioverted 1 time(s).  Cardioverted at 150J. Sync biphasic  Evaluation: Findings: Post procedure EKG shows: A-V sequential paced Complications: None Patient did tolerate procedure well.  Time Spent Directly with the Patient:  30 minutes   Sharaine Delange 02/01/2014, 11:28 AM

## 2014-02-01 NOTE — Interval H&P Note (Signed)
History and Physical Interval Note:  02/01/2014 10:31 AM  [name]  has presented today for surgery, with the diagnosis of AFIB  The various methods of treatment have been discussed with the patient and family. After consideration of risks, benefits and other options for treatment, the patient has consented to  Procedure(s): CARDIOVERSION (N/A) as a surgical intervention .  The patient's history has been reviewed, patient examined, no change in status, stable for surgery.  I have reviewed the patient's chart and labs.  Questions were answered to the patient's satisfaction.     Mackenzie Groom

## 2014-02-01 NOTE — Anesthesia Postprocedure Evaluation (Signed)
  Anesthesia Post-op Note  Patient: [name]  Procedure(s) Performed: Procedure(s): CARDIOVERSION (N/A)  Patient Location: PACU  Anesthesia Type:General  Level of Consciousness: awake, alert  and patient cooperative  Airway and Oxygen Therapy: Patient Spontanous Breathing and Patient connected to nasal cannula oxygen  Post-op Pain: none  Post-op Assessment: Post-op Vital signs reviewed, Respiratory Function Stable, Patent Airway and No signs of Nausea or vomiting  Post-op Vital Signs: Reviewed and stable  Complications: No apparent anesthesia complications

## 2014-02-01 NOTE — Preoperative (Signed)
Beta Blockers   Reason not to administer Beta Blockers:Lopressor 8 a.m.

## 2014-02-01 NOTE — Anesthesia Preprocedure Evaluation (Addendum)
Anesthesia Evaluation  Patient identified by MRN, date of birth, ID band Patient awake    Reviewed: Allergy & Precautions, H&P , NPO status , Patient's Chart, lab work & pertinent test results, reviewed documented beta blocker date and time   History of Anesthesia Complications Negative for: history of anesthetic complications  Airway Mallampati: II TM Distance: >3 FB Neck ROM: Full    Dental  (+) Teeth Intact, Dental Advisory Given and Chipped,    Pulmonary shortness of breath and with exertion, sleep apnea and Continuous Positive Airway Pressure Ventilation ,  breath sounds clear to auscultation        Cardiovascular hypertension, Pt. on medications and Pt. on home beta blockers + DOE + dysrhythmias (INR 2.77) Atrial Fibrillation + pacemaker Rhythm:Irregular Rate:Normal  EF 30%, mod MR   Neuro/Psych    GI/Hepatic negative GI ROS, Neg liver ROS,   Endo/Other  diabetes (glu 92)Hypothyroidism Morbid obesity  Renal/GU CRFRenal disease     Musculoskeletal  (+) Fibromyalgia -  Abdominal (+) + obese,   Peds  Hematology   Anesthesia Other Findings   Reproductive/Obstetrics                         Anesthesia Physical Anesthesia Plan  ASA: IV  Anesthesia Plan: General   Post-op Pain Management:    Induction: Intravenous  Airway Management Planned: Mask  Additional Equipment:   Intra-op Plan:   Post-operative Plan:   Informed Consent: I have reviewed the patients History and Physical, chart, labs and discussed the procedure including the risks, benefits and alternatives for the proposed anesthesia with the patient or authorized representative who has indicated his/her understanding and acceptance.   Dental advisory given  Plan Discussed with: CRNA, Anesthesiologist and Surgeon  Anesthesia Plan Comments:        Anesthesia Quick Evaluation

## 2014-02-01 NOTE — Transfer of Care (Signed)
Immediate Anesthesia Transfer of Care Note  Patient: [name]  Procedure(s) Performed: Procedure(s): CARDIOVERSION (N/A)  Patient Location: PACU  Anesthesia Type:General  Level of Consciousness: awake, alert  and patient cooperative  Airway & Oxygen Therapy: Patient Spontanous Breathing and Patient connected to nasal cannula oxygen  Post-op Assessment: Report given to PACU RN and Post -op Vital signs reviewed and stable  Post vital signs: Reviewed and stable  Complications: No apparent anesthesia complications

## 2014-02-01 NOTE — Discharge Instructions (Signed)
Electrical Cardioversion, Care After Refer to this sheet in the next few weeks. These instructions provide you with information on caring for yourself after your procedure. Your health care provider may also give you more specific instructions. Your treatment has been planned according to current medical practices, but problems sometimes occur. Call your health care provider if you have any problems or questions after your procedure. WHAT TO EXPECT AFTER THE PROCEDURE After your procedure, it is typical to have the following sensations:  Some redness on the skin where the shocks were delivered. If this is tender, a sunburn lotion or hydrocortisone cream may help.  Possible return of an abnormal heart rhythm within hours or days after the procedure. HOME CARE INSTRUCTIONS  Only take medicine as directed by your health care provider. Be sure you understand how and when to take your medicine.  Learn how to feel your pulse and check it often.  Limit your activity for 48 hours after the procedure or as directed.  Avoid or minimize caffeine and other stimulants as directed. SEEK MEDICAL CARE IF:  You feel like your heart is beating too fast or your pulse is not regular.  You have any questions about your medicines.  You have bleeding that will not stop. SEEK IMMEDIATE MEDICAL CARE IF:  You are dizzy or feel faint.  It is hard to breathe or you feel short of breath.  There is a change in discomfort in your chest.  Your speech is slurred or you have trouble moving an arm or leg on one side of your body.  You get a serious muscle cramp that does not go away.  Your fingers or toes turn cold or blue. MAKE SURE YOU:   Understand these instructions.   Will watch your condition.   Will get help right away if you are not doing well or get worse. Document Released: 10/07/2013 Document Reviewed: 07/01/2013 [facility] Patient Information 2014 [place], [place].

## 2014-02-02 ENCOUNTER — Encounter (HOSPITAL_COMMUNITY): Payer: Self-pay | Admitting: Cardiovascular Disease

## 2014-02-04 ENCOUNTER — Other Ambulatory Visit (HOSPITAL_COMMUNITY): Payer: Medicare Other

## 2014-02-09 ENCOUNTER — Other Ambulatory Visit (HOSPITAL_COMMUNITY): Payer: Medicare Other

## 2014-02-09 ENCOUNTER — Ambulatory Visit (HOSPITAL_COMMUNITY): Payer: Medicare Other

## 2014-02-10 NOTE — Progress Notes (Signed)
This encounter was created in error - please disregard.  This encounter was created in error - please disregard.

## 2014-02-11 ENCOUNTER — Telehealth: Payer: Self-pay | Admitting: *Deleted

## 2014-02-11 NOTE — Telephone Encounter (Signed)
I asked patient to send a remote transmission. Patient states that she believes she sent the

## 2014-02-18 ENCOUNTER — Other Ambulatory Visit: Payer: Self-pay | Admitting: "Endocrinology

## 2014-02-18 DIAGNOSIS — E1165 Type 2 diabetes mellitus with hyperglycemia: Principal | ICD-10-CM

## 2014-02-18 DIAGNOSIS — IMO0001 Reserved for inherently not codable concepts without codable children: Secondary | ICD-10-CM

## 2014-03-06 LAB — HEMOGLOBIN A1C
Hgb A1c MFr Bld: 7 % — ABNORMAL HIGH (ref <5.7)
Mean Plasma Glucose: 154 mg/dL — ABNORMAL HIGH (ref <117)

## 2014-03-06 LAB — T4, FREE: FREE T4: 1.77 ng/dL (ref 0.80–1.80)

## 2014-03-06 LAB — T3, FREE: T3, Free: 2 pg/mL — ABNORMAL LOW (ref 2.3–4.2)

## 2014-03-06 LAB — TSH: TSH: 2.819 u[IU]/mL (ref 0.350–4.500)

## 2014-03-06 LAB — VITAMIN D 25 HYDROXY (VIT D DEFICIENCY, FRACTURES): VIT D 25 HYDROXY: 40 ng/mL (ref 30–89)

## 2014-03-06 LAB — CALCIUM: CALCIUM: 9 mg/dL (ref 8.4–10.5)

## 2014-03-08 ENCOUNTER — Ambulatory Visit (INDEPENDENT_AMBULATORY_CARE_PROVIDER_SITE_OTHER): Payer: Medicare Other | Admitting: Pharmacist Clinician (PhC)/ Clinical Pharmacy Specialist

## 2014-03-08 ENCOUNTER — Ambulatory Visit (INDEPENDENT_AMBULATORY_CARE_PROVIDER_SITE_OTHER): Payer: Medicare Other | Admitting: Cardiovascular Disease

## 2014-03-08 ENCOUNTER — Encounter: Payer: Self-pay | Admitting: Cardiovascular Disease

## 2014-03-08 VITALS — BP 134/64 | Resp 24 | Ht 66.0 in | Wt 342.0 lb

## 2014-03-08 DIAGNOSIS — L97909 Non-pressure chronic ulcer of unspecified part of unspecified lower leg with unspecified severity: Secondary | ICD-10-CM

## 2014-03-08 DIAGNOSIS — N184 Chronic kidney disease, stage 4 (severe): Secondary | ICD-10-CM

## 2014-03-08 DIAGNOSIS — L97529 Non-pressure chronic ulcer of other part of left foot with unspecified severity: Secondary | ICD-10-CM

## 2014-03-08 DIAGNOSIS — I4891 Unspecified atrial fibrillation: Secondary | ICD-10-CM

## 2014-03-08 DIAGNOSIS — R5383 Other fatigue: Secondary | ICD-10-CM

## 2014-03-08 DIAGNOSIS — Z950 Presence of cardiac pacemaker: Secondary | ICD-10-CM

## 2014-03-08 DIAGNOSIS — R5381 Other malaise: Secondary | ICD-10-CM

## 2014-03-08 DIAGNOSIS — L97921 Non-pressure chronic ulcer of unspecified part of left lower leg limited to breakdown of skin: Secondary | ICD-10-CM

## 2014-03-08 DIAGNOSIS — I5042 Chronic combined systolic (congestive) and diastolic (congestive) heart failure: Secondary | ICD-10-CM

## 2014-03-08 DIAGNOSIS — Z79899 Other long term (current) drug therapy: Secondary | ICD-10-CM

## 2014-03-08 DIAGNOSIS — L97519 Non-pressure chronic ulcer of other part of right foot with unspecified severity: Secondary | ICD-10-CM

## 2014-03-08 DIAGNOSIS — L97509 Non-pressure chronic ulcer of other part of unspecified foot with unspecified severity: Secondary | ICD-10-CM

## 2014-03-08 DIAGNOSIS — L97911 Non-pressure chronic ulcer of unspecified part of right lower leg limited to breakdown of skin: Secondary | ICD-10-CM | POA: Insufficient documentation

## 2014-03-08 DIAGNOSIS — I4819 Other persistent atrial fibrillation: Secondary | ICD-10-CM

## 2014-03-08 DIAGNOSIS — Z7901 Long term (current) use of anticoagulants: Secondary | ICD-10-CM

## 2014-03-08 LAB — PACEMAKER DEVICE OBSERVATION

## 2014-03-08 LAB — POCT INR: INR: 2.4

## 2014-03-08 LAB — PTH, INTACT AND CALCIUM
Calcium: 9 mg/dL (ref 8.4–10.5)
PTH: 254.4 pg/mL — ABNORMAL HIGH (ref 14.0–72.0)

## 2014-03-08 MED ORDER — DOXYCYCLINE HYCLATE 100 MG PO CAPS: ORAL_CAPSULE | ORAL | Status: DC

## 2014-03-08 MED ORDER — TORSEMIDE 100 MG PO TABS: 100.0000 mg | ORAL_TABLET | Freq: Two times a day (BID) | ORAL | Status: DC

## 2014-03-08 MED ORDER — POTASSIUM CHLORIDE CRYS ER 20 MEQ PO TBCR: 20.0000 meq | EXTENDED_RELEASE_TABLET | Freq: Four times a day (QID) | ORAL | Status: DC

## 2014-03-08 MED ORDER — AMIODARONE HCL 200 MG PO TABS: 200.0000 mg | ORAL_TABLET | Freq: Two times a day (BID) | ORAL | Status: DC

## 2014-03-08 MED ORDER — METOLAZONE 5 MG PO TABS: 5.0000 mg | ORAL_TABLET | Freq: Every day | ORAL | Status: DC

## 2014-03-08 NOTE — Assessment & Plan Note (Addendum)
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 Ventricular dysfunction may be partly related to obliterate for a right ventricular pacing secondary to complete heart block (however pacing proceeds cardiomyopathy by many years). Increase torsemide 200 mg twice daily. Continue metolazone 5 mg daily. Increase potassium chloride to a total of 80 mEq daily Check metabolic panel in a couple of days second to high risk of hypokalemia and hypomagnesemia and acute renal failure. Follow up in clinic on Friday. Unable to treat with ACE inhibitors. On a moderate dose of beta blocker.

## 2014-03-08 NOTE — Assessment & Plan Note (Signed)
High likelihood that she'll develop acute worsening of renal function with more aggressive diuresis. I asked her about dialysis today. She did not give me a straight answer. It sounds like she dreads the prospect, but has not ruled it out entirely.

## 2014-03-08 NOTE — Patient Instructions (Signed)
Dr Sallyanne Kuster has made the following medication changes: INCREASE Torsemide to 1 tablet (100 mg total) twice daily. START taking Metolazone 5 mg daily. START taking Potassium 20 MEq four times daily. START taking Doxycycline 100 mg twice daily.  Dr Sallyanne Kuster has referred you to the [facility] for the ulcers on your legs.  Dr Sallyanne Kuster has ordered some lab work TO BE DONE Thursday.  Dr Sallyanne Kuster would like you to follow up with an extender on Friday.

## 2014-03-08 NOTE — Assessment & Plan Note (Signed)
I suspect that these are related to chronic edema/venous stasis due to a combination of venous insufficiency and severe right heart failure. I have asked her to stop putting hydrocortisone on the wound since this will be counterproductive. It will further thin the skin increase the likelihood of ulcers and prevent healing. I worry that she has developed some degree of infection and prescribed doxycycline 100 mg twice a day. I asked her to keep her legs elevated as much as possible and made a referral to wound care clinic

## 2014-03-08 NOTE — Assessment & Plan Note (Signed)
Normal device function. She is pacemaker dependent. No changes were made the device settings. Consider referral for cardiac resynchronization therapy once we get her more stabilized. Also consider referral to the CHF clinic.

## 2014-03-08 NOTE — Assessment & Plan Note (Signed)
Despite successfully maintaining sinus rhythm, her heart failure has worsened. We'll reduce the amiodarone to 400 mg twice a day to reduce the likelihood of side effects. The reduction in the overall dose may lead to increased warfarin requirements, but treat him with doxycycline may move to the INR in the opposite direction. We'll have to check her INR more frequently. Note increased risk of digoxin toxicity with aggressive diuresis and concomitant treatment with amiodarone. May have to temporarily stop the digoxin if her renal function deteriorates or she develops severe hypokalemia.

## 2014-03-08 NOTE — Progress Notes (Signed)
Patient ID: [name], female   DOB: October 27, 1950, 64 y.o.   MRN: [id number]      Reason for office visit Atrial fibrillation post cardioversion, chronic systolic heart failure, severe mitral insufficiency  "[name]" returns in followup after her cardioversion on February 2. She missed the initial appointment secondary to an acute gastrointestinal illness. She has successfully maintained normal rhythm (atrial paced rhythm) since the cardioversion.  Unfortunately, she has evidence of worsening lower extremity edema. She has multiple weeping wounds on both legs and several superficial skin erosions especially prominent on the medial surface of her right calf and the anterolateral surface of her left calf. She is roughly 12 pounds above her estimated "dry weight" of 330 pounds, although her weight is not much different from what it has been over the last several weeks. She has a lot of stinging discomfort in her legs, but has not had fever or chills. The skin of both legs is quite red. There has been some slight bleeding from the wounds, but more prominently there is active constant weeping of large amounts of fluids from the breaks in her skin.  She has NYHA functional class 3-4 dyspnea and maybe some orthopnea. She does not have chest pain.  Her INR is in the therapeutic range today at 2.4. He has a dual-chamber Medtronic pacemaker with normal function but shows no atrial fibrillation whatsoever since the cardioversion. She is pacemaker dependent, due to complete heart block but also paces the atrium 100% of the time when not in atrial fibrillation, primarily due to treatment with relatively high doses of amiodarone.  [name] has moderately severe nonischemic cardiomyopathy with a left ventricular ejection fraction of 30-35%, severe mitral insufficiency and severe tricuspid insufficiency and moderate to severe pulmonary hypertension. Because of her severe obesity and multiple comorbid conditions she  is not a good candidate for surgical valve repair. She also has a single kidney and at least stage III chronic kidney disease and has had very volatile renal function parameters over the last couple of years. In the past has told me that under no circumstances will she her for consider Hemodialysis. She seems to have a more ambiguous position about hemodialysis. She sees Dr. Lorrene Reid in the nephrology clinic   Allergies  Allergen Reactions  . Ivp Dye [Iodinated Diagnostic Agents] Shortness Of Breath    Turn red, can't breathe  . Betadine [Povidone Iodine]   . Diphenhydramine Hcl Swelling    In hands and eyes  . Fish Allergy Nausea And Vomiting  . Iohexol      Desc: PT TURNS RED AND WHEEZING   . Penicillins Other (See Comments)    Resp arrest as child  . Povidone-Iodine Other (See Comments)    Wheezing, turn red, can't breath, and blisters  . Promethazine Hcl Other (See Comments)    Low Blood Pressure  . Tape     Blisters, can use paper tape for short periods  . Clindamycin Hives and Rash    wheezing  . Iodine Rash  . Morphine Sulfate Nausea And Vomiting and Rash    Current Outpatient Prescriptions  Medication Sig Dispense Refill  . albuterol (PROVENTIL HFA;VENTOLIN HFA) 108 (90 BASE) MCG/ACT inhaler Inhale 2 puffs into the lungs every 6 (six) hours as needed. For shortness of breath      . albuterol (PROVENTIL) (2.5 MG/3ML) 0.083% nebulizer solution Take 2.5 mg by nebulization every 6 (six) hours as needed. For shortness of breath      . ALPRAZolam (  XANAX) 0.25 MG tablet Take 1 tablet (0.25 mg total) by mouth at bedtime as needed for anxiety.  30 tablet  3  . amiodarone (PACERONE) 200 MG tablet Take 1 tablet (200 mg total) by mouth 2 (two) times daily.  180 tablet  6  . calcitRIOL (ROCALTROL) 0.25 MCG capsule Take 0.25 mcg by mouth 2 (two) times daily.      . calcium citrate-vitamin D 200-200 MG-UNIT TABS Take 1 tablet by mouth 4 (four) times daily -  before meals and at bedtime.       Marland Kitchen COUMADIN 5 MG tablet Take 1 tablet by mouth daily or as directed  90 tablet  1  . digoxin (LANOXIN) 0.125 MG tablet Take 0.5 tablets (0.0625 mg total) by mouth daily.  15 tablet  10  . glyBURIDE (DIABETA) 5 MG tablet Take 1-2 tablets (5-10 mg total) by mouth 2 (two) times daily with a meal. Take 10mg  in the morning and 5mg  at night  120 tablet  4  . HYDROcodone-acetaminophen (NORCO) 10-325 MG per tablet Take 1 tablet by mouth 2 (two) times daily as needed for pain.  90 tablet  5  . insulin glargine (LANTUS) 100 UNIT/ML injection Use up to 27 units daily      . Insulin Pen Needle (B-D UF III MINI PEN NEEDLES) 31G X 5 MM MISC Check blood sugar up to 6x daily  200 each  3  . iron polysaccharides (NIFEREX) 150 MG capsule Take 150 mg by mouth 2 (two) times daily.      Marland Kitchen JANUVIA 100 MG tablet TAKE 1 TABLET DAILY.  30 tablet  4  . levothyroxine (SYNTHROID, LEVOTHROID) 125 MCG tablet Take 125 mcg by mouth daily before breakfast.      . metolazone (ZAROXOLYN) 5 MG tablet Take 1 tablet (5 mg total) by mouth daily.  30 tablet  11  . metoprolol tartrate (LOPRESSOR) 25 MG tablet Take 1.5 tablets (37.5 mg total) by mouth 2 (two) times daily.  90 tablet  11  . Multiple Vitamin (MULTIVITAMIN) tablet Take 1 tablet by mouth daily.       . nitroGLYCERIN (NITRODUR - DOSED IN MG/24 HR) 0.4 mg/hr patch Place 1 patch (0.4 mg total) onto the skin every other day.  30 patch  6  . nitroGLYCERIN (NITROSTAT) 0.4 MG SL tablet Place 0.4 mg under the tongue every 5 (five) minutes as needed. For chest pain      . ONE TOUCH ULTRA TEST test strip       . potassium chloride SA (K-DUR,KLOR-CON) 20 MEQ tablet Take 1 tablet (20 mEq total) by mouth 4 (four) times daily.  120 tablet  11  . tiotropium (SPIRIVA) 18 MCG inhalation capsule Place 18 mcg into inhaler and inhale daily.      Marland Kitchen torsemide (DEMADEX) 100 MG tablet Take 1 tablet (100 mg total) by mouth 2 (two) times daily.  60 tablet  11  . Vitamin D, Ergocalciferol, (DRISDOL)  50000 UNITS CAPS Take 50,000 Units by mouth 2 (two) times a week. Takes on [place]      . doxycycline (VIBRAMYCIN) 100 MG capsule Take 1 capsule (100 mg total) by mouth twice daily for 10 days.  20 capsule  0   No current facility-administered medications for this visit.    Past Medical History  Diagnosis Date  . Personal history of other diseases of circulatory system   . 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  Chronic lymphocytic thyroiditis   . Morbid obesity   . Other chronic pulmonary heart diseases   . Obstructive sleep apnea (adult) (pediatric)   . Other and unspecified hyperlipidemia   . Fibromyalgia   . Goiter   . Thyroiditis, autoimmune   . Hypothyroidism, acquired, autoimmune   . Solitary kidney, acquired   . Fatigue   . Hyperparathyroidism, secondary   . Vitamin D deficiency disease   . Hyperparathyroidism   . H/O varicose veins 03/27/12  . Type II or unspecified type diabetes mellitus without mention of complication, not stated as uncontrolled   . Diabetes mellitus type II   . Infections of kidney 03/27/12  . History of measles, mumps, or rubella 03/27/12  . Tubal pregnancy 12/23/76  . Unspecified essential hypertension     20 yrs ago  . Blood transfusion 1977    [facility]  . Complication of anesthesia 2010    hard to wake up after knee surgery  . Anemia   . Cardiomyopathy, nonischemic     EF 45 %  . Endometrial hyperplasia with atypia 05/05/12  . Diastolic HF (heart failure) 11/05/2012  . A-fib   . Echocardiogram abnormal 07/15/12    severe MR,mod to severe TR,mod to severe PA hypertension  . Pulmonary arterial hypertension   . PAF (paroxysmal atrial fibrillation), 1st episode since DCCV in Jan/14 03/19/2013  . Ulcers of both lower extremities, small, now with cellulitis 06/05/2013    Past Surgical History  Procedure Laterality Date  . Cholecystectomy    . Pacemaker placement  02/26/11    Medtronic Adapta  . Knee surgery    .  Resection duplicate left kidney    . 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 Hernia repair  1999    ruptured ;  . D& c with hysteroscopy & cervical polypectomy  05/05/12  . Cardioversion N/A 02/06/2013    Successful  . Iud removal  01/30/13  . Ectopic pregnancy surgery  1978  . Cardioversion N/A 06/08/2013    Procedure: CARDIOVERSION;  Surgeon: Sanda Klein, MD;  Location: [place];  Service: Cardiovascular;  Laterality: N/A;  Room [id number]  . Cardioversion N/A 02/01/2014    Procedure: CARDIOVERSION;  Surgeon: Sanda Klein, MD;  Location: [facility];  Service: Cardiovascular;  Laterality: N/A;    Family History  Problem Relation Age of Onset  . Cardiomyopathy Brother   . Diabetes Mother   . Cancer Maternal Grandmother   . Heart attack Mother     History   Social History  . Marital Status: Married    Spouse Name: N/A    Number of Children: N/A  . Years of Education: N/A   Occupational History  . retired Building services engineer    Social History Main Topics  . Smoking status: Never Smoker   . Smokeless tobacco: Never Used  . Alcohol Use: No  . Drug Use: No  . Sexual Activity: Yes    Partners: Male    Birth Control/ Protection: Post-menopausal   Other Topics Concern  . Not on file   Social History Narrative  . No narrative on file    Review of systems: Severe dyspnea with minimal exertion, possible mild orthopnea, severe lower showed edema with active weeping and multiple skin wounds, no pathological bleeding, no chest pain, no syncope or palpitations, no fever or chills, no change in bowel pattern, no abdominal pain, recent nausea and vomiting that has resolved, no urinary complaints, no vaginal bleeding, no hot flashes, no  new neurological complaints  PHYSICAL EXAM BP 134/64  Resp 24  Ht 5\' 6"  (1.676 m)  Wt 155.13 kg (342 lb)  BMI 55.23 kg/m2 General appearance: alert, cooperative, no distress and morbidly obese  Neck: no adenopathy, no carotid bruit, supple,  symmetrical, trachea midline, thyroid not enlarged, symmetric, no tenderness/mass/nodules and It difficult to see jugular venous pulsations due to obesity  Lungs: clear to auscultation bilaterally  Heart: regular rate and rhythm, S1: split, S2: wide splitting, S3 present and systolic murmur: holosystolic 2/6, blowing at apex  Abdomen: soft, non-tender; bowel sounds normal; no masses, no organomegaly  Extremities: edema 3+ symmetrically bilaterally and Multiple 1-5 cm ulcers with active weeping and mild bleeding bilaterally, blisters with overlying devitalized skin Pulses: 2+ and symmetric  Skin: Ulcers as described above, rounding erythema is worsening and very concerning for cellulitis  Neurologic: Alert and oriented X 3, normal strength and tone. Normal symmetric reflexes. Normal coordination and gait   EKG: 100% atrioventricular paced by pacemaker interrogation   Lipid Panel     Component Value Date/Time   CHOL 149 11/30/2013 0942   TRIG 137 11/30/2013 0942   HDL 33* 11/30/2013 0942   CHOLHDL 4.5 11/30/2013 0942   VLDL 27 11/30/2013 0942   LDLCALC 89 11/30/2013 0942    BMET    Component Value Date/Time   NA 138 01/28/2014 0811   K 3.4* 01/28/2014 0811   CL 93* 01/28/2014 0811   CO2 34* 01/28/2014 0811   GLUCOSE 111* 01/28/2014 0811   BUN 70* 01/28/2014 0811   CREATININE 1.96* 01/28/2014 0811   CREATININE 2.19* 09/21/2013 1002   CREATININE 1.28* 02/12/2011 1000   CALCIUM 9.0 03/05/2014 2237   CALCIUM 9.5 03/24/2012 1212   GFRNONAA 22* 09/21/2013 1002   GFRAA 26* 09/21/2013 1002     ASSESSMENT AND PLAN Chronic combined systolic and diastolic HF (heart failure) Acute exacerbation.Decompensated biventricular heart failure in a patient with 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 Ventricular dysfunction may be partly related to obliterate for a right ventricular pacing secondary to complete heart block (however  pacing proceeds cardiomyopathy by many years). Increase torsemide 200 mg twice daily. Continue metolazone 5 mg daily. Increase potassium chloride to a total of 80 mEq daily Check metabolic panel in a couple of days second to high risk of hypokalemia and hypomagnesemia and acute renal failure. Follow up in clinic on Friday. Unable to treat with ACE inhibitors. On a moderate dose of beta blocker.  CKD (chronic kidney disease) stage 4, GFR 15-29 ml/min High likelihood that she'll develop acute worsening of renal function with more aggressive diuresis. I asked her about dialysis today. She did not give me a straight answer. It sounds like she dreads the prospect, but has not ruled it out entirely.  Ulcers of both lower extremities I suspect that these are related to chronic edema/venous stasis due to a combination of venous insufficiency and severe right heart failure. I have asked her to stop putting hydrocortisone on the wound since this will be counterproductive. It will further thin the skin increase the likelihood of ulcers and prevent healing. I worry that she has developed some degree of infection and prescribed doxycycline 100 mg twice a day. I asked her to keep her legs elevated as much as possible and made a referral to wound care clinic  Atrial fibrillation, persistent Despite successfully maintaining sinus rhythm, her heart failure has worsened. We'll reduce the amiodarone to 400  mg twice a day to reduce the likelihood of side effects. The reduction in the overall dose may lead to increased warfarin requirements, but treat him with doxycycline may move to the INR in the opposite direction. We'll have to check her INR more frequently. Note increased risk of digoxin toxicity with aggressive diuresis and concomitant treatment with amiodarone. May have to temporarily stop the digoxin if her renal function deteriorates or she develops severe hypokalemia.  PACEMAKER, PERMANENT Normal device  function. She is pacemaker dependent. No changes were made the device settings. Consider referral for cardiac resynchronization therapy once we get her more stabilized. Also consider referral to the CHF clinic.    Orders Placed This Encounter  Procedures  . Comprehensive metabolic panel  . Magnesium  . AMB referral to wound care center   Meds ordered this encounter  Medications  . torsemide (DEMADEX) 100 MG tablet    Sig: Take 1 tablet (100 mg total) by mouth 2 (two) times daily.    Dispense:  60 tablet    Refill:  11  . metolazone (ZAROXOLYN) 5 MG tablet    Sig: Take 1 tablet (5 mg total) by mouth daily.    Dispense:  30 tablet    Refill:  11  . potassium chloride SA (K-DUR,KLOR-CON) 20 MEQ tablet    Sig: Take 1 tablet (20 mEq total) by mouth 4 (four) times daily.    Dispense:  120 tablet    Refill:  11  . doxycycline (VIBRAMYCIN) 100 MG capsule    Sig: Take 1 capsule (100 mg total) by mouth twice daily for 10 days.    Dispense:  20 capsule    Refill:  0  . amiodarone (PACERONE) 200 MG tablet    Sig: Take 1 tablet (200 mg total) by mouth 2 (two) times daily.    Dispense:  180 tablet    Refill:  6    Order Specific Question:  Supervising Provider    Answer:  Shelva Majestic A [[id number]]    Holli Humbles, MD, [place] [phone] office [phone] pager

## 2014-03-09 ENCOUNTER — Encounter: Payer: Self-pay | Admitting: "Endocrinology

## 2014-03-09 ENCOUNTER — Ambulatory Visit (INDEPENDENT_AMBULATORY_CARE_PROVIDER_SITE_OTHER): Payer: Medicare Other | Admitting: "Endocrinology

## 2014-03-09 VITALS — BP 106/67 | HR 73 | Wt 346.0 lb

## 2014-03-09 DIAGNOSIS — E049 Nontoxic goiter, unspecified: Secondary | ICD-10-CM

## 2014-03-09 DIAGNOSIS — E063 Autoimmune thyroiditis: Secondary | ICD-10-CM

## 2014-03-09 DIAGNOSIS — E1165 Type 2 diabetes mellitus with hyperglycemia: Principal | ICD-10-CM

## 2014-03-09 DIAGNOSIS — R5383 Other fatigue: Secondary | ICD-10-CM

## 2014-03-09 DIAGNOSIS — E1169 Type 2 diabetes mellitus with other specified complication: Secondary | ICD-10-CM

## 2014-03-09 DIAGNOSIS — E038 Other specified hypothyroidism: Secondary | ICD-10-CM

## 2014-03-09 DIAGNOSIS — L97929 Non-pressure chronic ulcer of unspecified part of left lower leg with unspecified severity: Secondary | ICD-10-CM

## 2014-03-09 DIAGNOSIS — IMO0001 Reserved for inherently not codable concepts without codable children: Secondary | ICD-10-CM

## 2014-03-09 DIAGNOSIS — N2581 Secondary hyperparathyroidism of renal origin: Secondary | ICD-10-CM

## 2014-03-09 DIAGNOSIS — E11649 Type 2 diabetes mellitus with hypoglycemia without coma: Secondary | ICD-10-CM

## 2014-03-09 DIAGNOSIS — L97909 Non-pressure chronic ulcer of unspecified part of unspecified lower leg with unspecified severity: Secondary | ICD-10-CM

## 2014-03-09 DIAGNOSIS — E1149 Type 2 diabetes mellitus with other diabetic neurological complication: Secondary | ICD-10-CM

## 2014-03-09 DIAGNOSIS — E1142 Type 2 diabetes mellitus with diabetic polyneuropathy: Secondary | ICD-10-CM

## 2014-03-09 DIAGNOSIS — R609 Edema, unspecified: Secondary | ICD-10-CM

## 2014-03-09 DIAGNOSIS — R5381 Other malaise: Secondary | ICD-10-CM

## 2014-03-09 DIAGNOSIS — L97919 Non-pressure chronic ulcer of unspecified part of right lower leg with unspecified severity: Secondary | ICD-10-CM

## 2014-03-09 DIAGNOSIS — I10 Essential (primary) hypertension: Secondary | ICD-10-CM

## 2014-03-09 LAB — MDC_IDC_ENUM_SESS_TYPE_INCLINIC
Battery Remaining Longevity: 54 mo
Battery Voltage: 2.77 V
Brady Statistic AP VP Percent: 100 %
Brady Statistic AP VS Percent: 0 %
Date Time Interrogation Session: 20150309181659
Lead Channel Impedance Value: 313 Ohm
Lead Channel Setting Pacing Amplitude: 3 V
Lead Channel Setting Pacing Amplitude: 3.25 V
Lead Channel Setting Sensing Sensitivity: 2 mV
MDC IDC MSMT BATTERY IMPEDANCE: 327 Ohm
MDC IDC MSMT LEADCHNL RV IMPEDANCE VALUE: 338 Ohm
MDC IDC SET LEADCHNL RV PACING PULSEWIDTH: 0.4 ms
MDC IDC STAT BRADY AS VP PERCENT: 0 %
MDC IDC STAT BRADY AS VS PERCENT: 0 %

## 2014-03-09 LAB — GLUCOSE, POCT (MANUAL RESULT ENTRY): POC Glucose: 172 mg/dl — AB (ref 70–99)

## 2014-03-09 MED ORDER — GLUCOSE BLOOD VI STRP: ORAL_STRIP | Status: AC

## 2014-03-09 MED ORDER — VITAMIN D (ERGOCALCIFEROL) 1.25 MG (50000 UNIT) PO CAPS: ORAL_CAPSULE | ORAL | Status: DC

## 2014-03-09 NOTE — Patient Instructions (Addendum)
Follow up visit in 3 months. Increase Lantus dose to 28 units tonight. In one week, if most BGs are not < 18, increase the Lantus dose to 29 units. Call on Monday or Tuesday of next week to discuss BGs.

## 2014-03-09 NOTE — Progress Notes (Addendum)
Subjective:  Patient Name: [name] Date of Birth: Nov 10, 1950  MRN: [id number]  [name]  presents to the office today for follow-up of her insulin-requiring T2DM, obesity, leg edema, leg ulcers, secondary hyperparathyroidism, vitamin D deficiency disease, hypertension, thyroiditis, hypothyroidism, and edema, in the setting of chronic, recurrent atrial fibrillation and chronic kidney disease.  HISTORY OF PRESENT ILLNESS:   [name] is a 64 y.o. Caucasian woman. [name] was unaccompanied.  1.The patient was first referred to me on 10/30/05 by her primary care provider, Dr. Sharlet Salina, for evaluation and co-management of her diabetes and obesity. I have followed her for the above problems since then. Please see my clinic note from 06/06/11 for detailed description of the course of her type 2 diabetes mellitus, hypothyroidism secondary to Hashimoto's thyroiditis, and hyperparathyroidism secondary to deficiency of calcium intake, vitamin D intake, and renal conversion of 25-hydroxy vitamin D to 1, 25-dihydroxy vitamin D.   2. At her clinic visit on 03/24/12 she had a left shin ulcer that was open and draining. Her HbA1c was 8.0%. Her oral anti-hyperglycemic medications, Januvia, 100 mg/day, and glyburide, 10 mg twice a day, were not adequately controlling her BGs. I finally convinced her to add Lantus insulin, at a dose of 4 units each evening. I also coordinated with Dr. Lenord Fellers and started the patient on topical Bactroban and oral doxycycline. Her ulcer subsequently healed, but she has had many recurrent leg ulcers bilaterally since then. We have gradually but progressively increased her Lantus dose since then and her BGs have improved substantially.   3. The patient's last [facility] visit was on 12/04/13.   A. She went into atrial fib shortly after her last visit with me. As a result she had a recurrence of CHF. She was cardioverted on 02/01/14 by Dr. Royann Shivers.   B. Soon afterward  she had gastroenteritis for two weeks.   C. About mid-February she developed many leg blisters which opened and ulcerated. Dr. Royann Shivers put her on antibiotics and referred her to the [name]. That referral is pending.  D. 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   E. Her sense of balance is very poor. She hesitates to walk without a walker. When she turns over in bed she often gets dizzy. Changes in head position, especially head extension, also cause dizziness. Her nose has been quite runny off and on, mostly on.   F. She is now taking 27 units of Lantus each evening (0.20 units/kg/day). She also takes glyburide, 10 mg at breakfast and 5 mg at supper and Januvia, 100 each AM. Her insurance is finally covering her glyburide.        4. Pertinent Review of Systems:  Constitutional: The patient does not feel good. Her energy is worse. She is not tolerating the cold weather very well. She is not sleeping well, even with her CPAP and oxygen. Food still does not taste good, so she has not been eating as much.  Eyes: As above. Neck: The patient has not had any episodes of thyroiditis since last visit. She has been hoarse due to her URIs and allergies acting up.   Heart: She remains in sinus rhythm most of the time, but does occasionally note A-fib. Her pacemaker is still working. She has not had any problems with chest pains recently.  Lungs: She has occasional wheezing.  Gastrointestinal: Taking Tums at night has helped with her heartburn. Bowel movents seem normal. She has not had much abdominal pain recently.  Legs: Muscle mass and strength seem normal. There are no complaints of numbness, tingling, burning, or pain. Her new ulcers are open and draining. Edema is worse, but better when she gets a good night's sleep.   Feet: There are no obvious foot problems, except swelling. The swelling is better. There are no complaints of numbness or pain. She does have tingling and burning when the feet are more  swollen. Feet are also colder then.  GYN: No menses Hypoglycemia: She has not had any low BGs recently. Lowest documented BG was 114, compared with 107 at last visit.  5. BG printout: Her average BG was 166, compared with 149.4 at last visit and with 119 at the prior visit, and with 138 at the third last visit. Her BG range is 114-201, compared with 107-187 at last visit and with 96-167 at the prior visit.     PAST MEDICAL, FAMILY, AND SOCIAL HISTORY:  Past Medical History  Diagnosis Date  . Personal history of other diseases of circulatory system   . 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 Chronic lymphocytic thyroiditis   . Morbid obesity   . Other chronic pulmonary heart diseases   . Obstructive sleep apnea (adult) (pediatric)   . Other and unspecified hyperlipidemia   . Fibromyalgia   . Goiter   . Thyroiditis, autoimmune   . Hypothyroidism, acquired, autoimmune   . Solitary kidney, acquired   . Fatigue   . Hyperparathyroidism, secondary   . Vitamin D deficiency disease   . Hyperparathyroidism   . H/O varicose veins 03/27/12  . Type II or unspecified type diabetes mellitus without mention of complication, not stated as uncontrolled   . Diabetes mellitus type II   . Infections of kidney 03/27/12  . History of measles, mumps, or rubella 03/27/12  . Tubal pregnancy 12/23/76  . Unspecified essential hypertension     20 yrs ago  . Blood transfusion 1977    [name]  . Complication of anesthesia 2010    hard to wake up after knee surgery  . Anemia   . Cardiomyopathy, nonischemic     EF 45 %  . Endometrial hyperplasia with atypia 05/05/12  . Diastolic HF (heart failure) 11/05/2012  . A-fib   . Echocardiogram abnormal 07/15/12    severe MR,mod to severe TR,mod to severe PA hypertension  . Pulmonary arterial hypertension   . PAF (paroxysmal atrial fibrillation), 1st episode since DCCV in Jan/14 03/19/2013  . Ulcers of both lower extremities, small, now with cellulitis  06/05/2013    Family History  Problem Relation Age of Onset  . Cardiomyopathy Brother   . Diabetes Mother   . Cancer Maternal Grandmother   . Heart attack Mother     Current outpatient prescriptions:albuterol (PROVENTIL HFA;VENTOLIN HFA) 108 (90 BASE) MCG/ACT inhaler, Inhale 2 puffs into the lungs every 6 (six) hours as needed. For shortness of breath, Disp: , Rfl: ;  albuterol (PROVENTIL) (2.5 MG/3ML) 0.083% nebulizer solution, Take 2.5 mg by nebulization every 6 (six) hours as needed. For shortness of breath, Disp: , Rfl:  ALPRAZolam (XANAX) 0.25 MG tablet, Take 1 tablet (0.25 mg total) by mouth at bedtime as needed for anxiety., Disp: 30 tablet, Rfl: 3;  amiodarone (PACERONE) 200 MG tablet, Take 1 tablet (200 mg total) by mouth 2 (two) times daily., Disp: 180 tablet, Rfl: 6;  calcitRIOL (ROCALTROL) 0.25 MCG capsule, Take 0.25 mcg by mouth 2 (two) times daily., Disp: , Rfl:  calcium citrate-vitamin  D 200-200 MG-UNIT TABS, Take 1 tablet by mouth 4 (four) times daily -  before meals and at bedtime., Disp: , Rfl: ;  COUMADIN 5 MG tablet, Take 1 tablet by mouth daily or as directed, Disp: 90 tablet, Rfl: 1;  digoxin (LANOXIN) 0.125 MG tablet, Take 0.5 tablets (0.0625 mg total) by mouth daily., Disp: 15 tablet, Rfl: 10 doxycycline (VIBRAMYCIN) 100 MG capsule, Take 1 capsule (100 mg total) by mouth twice daily for 10 days., Disp: 20 capsule, Rfl: 0;  glyBURIDE (DIABETA) 5 MG tablet, Take 1-2 tablets (5-10 mg total) by mouth 2 (two) times daily with a meal. Take 10mg  in the morning and 5mg  at night, Disp: 120 tablet, Rfl: 4 HYDROcodone-acetaminophen (NORCO) 10-325 MG per tablet, Take 1 tablet by mouth 2 (two) times daily as needed for pain., Disp: 90 tablet, Rfl: 5;  insulin glargine (LANTUS) 100 UNIT/ML injection, Use up to 27 units daily, Disp: , Rfl: ;  Insulin Pen Needle (B-D UF III MINI PEN NEEDLES) 31G X 5 MM MISC, Check blood sugar up to 6x daily, Disp: 200 each, Rfl: 3 iron polysaccharides (NIFEREX)  150 MG capsule, Take 150 mg by mouth 2 (two) times daily., Disp: , Rfl: ;  JANUVIA 100 MG tablet, TAKE 1 TABLET DAILY., Disp: 30 tablet, Rfl: 4;  levothyroxine (SYNTHROID, LEVOTHROID) 125 MCG tablet, Take 125 mcg by mouth daily before breakfast., Disp: , Rfl: ;  metolazone (ZAROXOLYN) 5 MG tablet, Take 1 tablet (5 mg total) by mouth daily., Disp: 30 tablet, Rfl: 11 metoprolol tartrate (LOPRESSOR) 25 MG tablet, Take 1.5 tablets (37.5 mg total) by mouth 2 (two) times daily., Disp: 90 tablet, Rfl: 11;  Multiple Vitamin (MULTIVITAMIN) tablet, Take 1 tablet by mouth daily. , Disp: , Rfl: ;  nitroGLYCERIN (NITRODUR - DOSED IN MG/24 HR) 0.4 mg/hr patch, Place 1 patch (0.4 mg total) onto the skin every other day., Disp: 30 patch, Rfl: 6 nitroGLYCERIN (NITROSTAT) 0.4 MG SL tablet, Place 0.4 mg under the tongue every 5 (five) minutes as needed. For chest pain, Disp: , Rfl: ;  ONE TOUCH ULTRA TEST test strip, , Disp: , Rfl: ;  potassium chloride SA (K-DUR,KLOR-CON) 20 MEQ tablet, Take 1 tablet (20 mEq total) by mouth 4 (four) times daily., Disp: 120 tablet, Rfl: 11;  tiotropium (SPIRIVA) 18 MCG inhalation capsule, Place 18 mcg into inhaler and inhale daily., Disp: , Rfl:  torsemide (DEMADEX) 100 MG tablet, Take 1 tablet (100 mg total) by mouth 2 (two) times daily., Disp: 60 tablet, Rfl: 11;  Vitamin D, Ergocalciferol, (DRISDOL) 50000 UNITS CAPS, Take 50,000 Units by mouth 2 (two) times a week. Takes on tuesdays and fridays, Disp: , Rfl:   Allergies as of 03/09/2014 - Review Complete 03/08/2014  Allergen Reaction Noted  . Ivp dye [iodinated diagnostic agents] Shortness Of Breath 05/19/2012  . Betadine [povidone iodine]  04/06/2013  . Diphenhydramine hcl Swelling   . Fish allergy Nausea And Vomiting 05/29/2011  . Iohexol  07/19/2005  . Penicillins Other (See Comments)   . Povidone-iodine Other (See Comments)   . Promethazine hcl Other (See Comments)   . Tape  08/06/2011  . Clindamycin Hives and Rash 05/17/2008   . Iodine Rash 05/21/2012  . Morphine sulfate Nausea And Vomiting and Rash     1. Work and Family: She spends a lot of her time traveling back and forth to her many physicians. 2. Activities: Same 3. Smoking, alcohol, or drugs: none 4. Primary Care Provider: Elby Showers, MD 5. Cardiologist: Dr.  Mihai Croitoru 6. GYN: Dr. Everett Graff 7. Oncologist: She will have a new oncologist at  the [facility] 8. Nephrology: Jamal Maes  REVIEW OF SYSTEMS: There are no other significant problems involving [name]'s other body systems.   Objective:  Vital Signs:  BP 106/67  Pulse 73  Wt 346 lb (156.945 kg)   Ht Readings from Last 3 Encounters:  03/08/14 5\' 6"  (1.676 m)  01/28/14 5\' 7"  (1.702 m)  10/06/13 5\' 7"  (1.702 m)   Wt Readings from Last 3 Encounters:  03/09/14 346 lb (156.945 kg)  03/08/14 342 lb (155.13 kg)  01/28/14 346 lb 8 oz (157.171 kg)    There is no height on file to calculate BSA.  PHYSICAL EXAM:  Constitutional: The patient appears healthy, physically more frail, and very  obese. She is very alert and mentally sharp today, but also seem somewhat depressed. She is more hoarse today. She walks more slowly with the aid of her walker. She obviously does not feel well. That said, she is still full of hell. She has gained 14 pounds since her visit in December.  Face: The face appears normal.  Eyes: There is no obvious arcus or proptosis. Moisture appears normal. Mouth: The oropharynx appears normal. Her mouth and tongue are very dry. Neck: The neck is visibly normal. No carotid bruits are noted. The thyroid gland is normal at 18-20 grams in size. The consistency of the thyroid gland is normal. The thyroid gland is not tender to palpation today.  Lungs: The lungs are clear to auscultation. Air movement is good. Heart: Heart rate and rhythm are regular. Heart sounds S1 and S2 are normal.   Abdomen: The abdomen is quite enlarged. Bowel sounds are normal.  There is no obvious hepatomegaly, splenomegaly, or other mass effect.  Arms: Muscle size and bulk are normal for age. Hands: She has a 2+ tremor of the right hand today and race tremor of the left hand.  Phalangeal and metacarpophalangeal joints are normal. Her left wrist is tender to palpation. Palmar muscles are normal. Palmar skin is normal. Palmar moisture is also normal. Legs: Muscles appear normal for age. Leg edema is 2-3+. Lesion of her medial left lower leg is about one cm in diameter, has a thick collar of callous, and is draining serous fluid freely down her leg and onto the floor. She also has a lesion of her left lateral lower leg that is bandaged. Feet: She did not want me to examine her feet because she would not be able to get her shoes back on.  Neurologic: Strength is normal for age in the arms, but decreased in the hip flexors.   LAB DATA:  Results for orders placed in visit on 03/09/14 (from the past 504 hour(s))  GLUCOSE, POCT (MANUAL RESULT ENTRY)   Collection Time    03/09/14  2:14 PM      Result Value Ref Range   POC Glucose 172 (*) 70 - 99 mg/dl  Results for orders placed in visit on 03/08/14 (from the past 504 hour(s))  POCT INR   Collection Time    03/08/14  3:17 PM      Result Value Ref Range   INR 2.4    MDC_IDC_ENUM_SESS_TYPE_INCLINIC   Collection Time    03/09/14  9:13 AM      Result Value Ref Range   Date Time Interrogation Session [id number]     Pulse Generator Manufacturer Medtronic     Pulse Gen Model  [id number] Adapta     Pulse Gen Serial Number [id number]     RV Sense Sensitivity 2.00     RA Pace Amplitude 3.000     RV Pace PulseWidth 0.40     RV Pace Amplitude 3.250     RA Impedance 313     RV IMPEDANCE 338     Battery Status Unknown     Battery Longevity 54     Battery Voltage 2.77     Battery Impedance 327     Brady AP VP Percent 100     Brady AS VP Percent 0     Brady AP VS Percent 0     Brady AS VS Percent 0     Eval Rhythm APVP      Miscellaneous Comment       Value: Pacemaker interrogation for AF Burden only. Patient has not had any episodes of AT/AF since her recent DCCV. No testing was performed this office visit. Patient will [facility] with [facility] on 3[facility]13 for CHF.  Results for orders placed in visit on 02/01/14 (from the past 504 hour(s))  PTH, INTACT AND CALCIUM   Collection Time    03/05/14  5:44 PM      Result Value Ref Range   PTH 254.4 (*) 14.0 - 72.0 pg/mL   Calcium 9.0  8.4 - 10.5 mg/dL  HEMOGLOBIN A1C   Collection Time    03/05/14 10:37 PM      Result Value Ref Range   Hemoglobin A1C 7.0 (*) <5.7 %   Mean Plasma Glucose 154 (*) <117 mg/dL  CALCIUM   Collection Time    03/05/14 10:37 PM      Result Value Ref Range   Calcium 9.0  8.4 - 10.5 mg/dL  VITAMIN D 25 HYDROXY   Collection Time    03/05/14 10:37 PM      Result Value Ref Range   Vit D, 25-Hydroxy 40  30 - 89 ng/mL  TSH   Collection Time    03/05/14 10:37 PM      Result Value Ref Range   TSH 2.819  0.350 - 4.500 uIU/mL  T4, FREE   Collection Time    03/05/14 10:37 PM      Result Value Ref Range   Free T4 1.77  0.80 - 1.80 ng/dL  T3, FREE   Collection Time    03/05/14 10:37 PM      Result Value Ref Range   T3, Free 2.0 (*) 2.3 - 4.2 pg/mL   Labs 03/05/14: HbA1c 7.0%; calcium 9.0, PTH 254.4, 25-hydroxy vitamin D 40; TSH 2.819, free T4 1.77, free T3 2.0  Labs 11/30/13: Her HbA1c was 6.8% in the lab, compared with 6.2% in the clinic at last visit and with 6.9% at the visit prior. The higher A1c reflects the higher  BGs that she has had during the past 2-3 months when she has been sick, when she's had to have cortisone injection and prednisone pills, and when her mobility has decreased. CMP showed a glucose of 140, potassium 3.6, creatinine 1.69; cholesterol 149, triglycerides 137, HDL 33, LDL 89; urinary microalbumin/creatinine ratio 47.4; TSH 2.831, free T4 1.58, free T3 2.3  Labs 08/21/13: TSH 1.703, free T4 1.81, free T3 2.1   Labs  08/13/13: 25-hydroxy vitamin d 52.8 (30-100), PTH 86 (15-65), urinary protein/creatinine ratio 256 ( <200)  Labs 07/23/13: Hgb 12.8, Hct 39.2%  Labs 07/20/13: BMP with calcium 9.0  Labs 05/05/13: TSH 1.791, free T4 1.86,  free T3 2.3 on her current Synthroid dose of 125 mcg/day.   Labs: 10/27/12: TSH 3.745, free T4 2.12, free T3 2.6; CMP: Cr 1.4; urinary microalbumin/creatinine concentration 89.6   Assessment and Plan:   ASSESSMENT:  1-2. Insulin-requiring T2DM/hypoglycemia:   A. The patient's HbA1c and BGs are higher, in large part due to her infections, but also due to increasing difficulty with moving about. She needs more Lantus now.   B. She has not had any low BGs recently  Given her known problems with cardiac arrhythmias, she is at even greater risk of fatal arrhythmias if she develops profound hypoglycemia. For [name], a HbA1c goal of 6.5-7.5% is a much healthier goal.     3. A-fib: The arrhythmia comes and goes of its own volition. She is in sinus rhythm today.   4. Leg edema: Edema is worse today.  5. Hypertension: BPs are well within the normal range today.  6. Abdominal pains: Her abdominal pains have resolved.   7. Iron deficiency: She will continue to be followed by hematology-oncology.  8. Hypothyroidism/Thyroiditis: Her thyroiditis is clinically quiescent today. Her TFTs this month were in the lower third of the normal range.  9. Fatigue: Her fatigue is worse again. Every time she goes into CHF it seems to take more out of her. Although she has had problems with anemia due to iron deficiency in the past, her labs in July showed that she was replete with iron, which may account for the improvement in her fatigue. She has also remained euthyroid. The cause of her residual fatigue is unclear. 10. Diabetic peripheral neuropathy: Her sensation is about the same today. 11. Secondary hyperparathyroidism: Her calcium of 9.0 has decreased Her PTH has tripled since August. Her  hyperparathyroidism is likely due to a combination of her CKD and a low calcium intake. Given her levels of PTH and 25-hydroxy vitamin D in August and her levels of both now, and given the fact that she is taking calcitriol twice a day, I would expect her serum calcium to be in the upper half of the normal range. Since her serum calcium is only 9.0, it appears that her intake of calcium is deficient. She needs to increase her calcium intake. She may also need to increase her calcitriol dosage. I will defer this issue to Dr. Lorrene Reid to see if she wants to handle it or wants me to do so.   PLAN:  1. Diagnostic: Ask Dr. Lorrene Reid to order a 1,25-dihydroxy vitamin D. I can't order it through [facility]. TFTs, PTH, calcium, and 25-hydroxy vitamin D prior to next visit.  Call in one week to discuss BGs 2. Therapeutic:   A. Watch salt intake tightly.   B. Increase Lantus dose to 28 units now. In one week, if BGs are not better (<180), increase the dose to 29 units. Continue glyburide and Januvia at their current doses.  Follow the Very Small scale HS snack plan.   C. I will defer treating [name]'s hyperparathyroidism to Dr. Lorrene Reid if she wishes to treat this problem. If not, I'll treat it.  3. Patient education: Until she can lose significant amounts of weight, she will need to continue the insulin and oral agent therapies. Given her renal disease, she will need to have goodly amounts of vitamin D and calcium  4. Follow-up: 3 months  Level of Service: This visit lasted in excess of 60 minutes. More than 50% of the visit was devoted to counseling.  Sherrlyn Hock, MD 03/09/2014  2:43 PM   Addendum: 03/10/14: I put in a referral order to the [name] today. I also called tyler at the [name] and asked him to expedite the referral. I'm trying to ensure that her leg ulcers are properly and expeditiously treated so that they don't become infected and so that she can avoid developing septicemia.   Sherrlyn Hock

## 2014-03-10 NOTE — Addendum Note (Signed)
Addended by: Sherrlyn Hock on: 03/10/2014 01:50 PM   Modules accepted: Orders

## 2014-03-12 ENCOUNTER — Ambulatory Visit: Payer: Medicare Other

## 2014-03-12 ENCOUNTER — Ambulatory Visit (INDEPENDENT_AMBULATORY_CARE_PROVIDER_SITE_OTHER): Payer: Medicare Other | Admitting: Pharmacist Clinician (PhC)/ Clinical Pharmacy Specialist

## 2014-03-12 ENCOUNTER — Telehealth: Payer: Self-pay | Admitting: Cardiovascular Disease

## 2014-03-12 ENCOUNTER — Ambulatory Visit: Payer: Medicare Other | Admitting: Pharmacist Clinician (PhC)/ Clinical Pharmacy Specialist

## 2014-03-12 ENCOUNTER — Telehealth: Payer: Self-pay | Admitting: *Deleted

## 2014-03-12 ENCOUNTER — Ambulatory Visit (INDEPENDENT_AMBULATORY_CARE_PROVIDER_SITE_OTHER): Payer: Medicare Other | Admitting: Cardiovascular Disease

## 2014-03-12 ENCOUNTER — Encounter: Payer: Self-pay | Admitting: Cardiovascular Disease

## 2014-03-12 VITALS — BP 94/68 | HR 72 | Ht 66.0 in | Wt 332.4 lb

## 2014-03-12 DIAGNOSIS — Z79899 Other long term (current) drug therapy: Secondary | ICD-10-CM

## 2014-03-12 DIAGNOSIS — I4891 Unspecified atrial fibrillation: Secondary | ICD-10-CM

## 2014-03-12 DIAGNOSIS — Z7901 Long term (current) use of anticoagulants: Secondary | ICD-10-CM

## 2014-03-12 LAB — COMPREHENSIVE METABOLIC PANEL
ALK PHOS: 91 U/L (ref 39–117)
ALT: 27 U/L (ref 0–35)
ALT: 44 U/L — ABNORMAL HIGH (ref 0–35)
AST: 31 U/L (ref 0–37)
AST: 49 U/L — AB (ref 0–37)
Albumin: 3.5 g/dL (ref 3.5–5.2)
Albumin: 3.5 g/dL (ref 3.5–5.2)
Alkaline Phosphatase: 101 U/L (ref 39–117)
BUN: 104 mg/dL — ABNORMAL HIGH (ref 6–23)
BUN: 101 mg/dL — ABNORMAL HIGH (ref 6–23)
CALCIUM: 9.1 mg/dL (ref 8.4–10.5)
CHLORIDE: 95 meq/L — AB (ref 96–112)
CO2: 28 mEq/L (ref 19–32)
CO2: 26 mEq/L (ref 19–32)
CREATININE: 2.35 mg/dL — AB (ref 0.50–1.10)
Calcium: 9.4 mg/dL (ref 8.4–10.5)
Chloride: 95 mEq/L — ABNORMAL LOW (ref 96–112)
Creat: 2.5 mg/dL — ABNORMAL HIGH (ref 0.50–1.10)
Glucose, Bld: 131 mg/dL — ABNORMAL HIGH (ref 70–99)
Glucose, Bld: 141 mg/dL — ABNORMAL HIGH (ref 70–99)
Potassium: 3.6 mEq/L (ref 3.5–5.3)
Potassium: 3.4 mEq/L — ABNORMAL LOW (ref 3.5–5.3)
SODIUM: 138 meq/L (ref 135–145)
Sodium: 137 mEq/L (ref 135–145)
TOTAL PROTEIN: 7.9 g/dL (ref 6.0–8.3)
Total Bilirubin: 1.1 mg/dL (ref 0.2–1.2)
Total Bilirubin: 1.2 mg/dL (ref 0.2–1.2)
Total Protein: 7.6 g/dL (ref 6.0–8.3)

## 2014-03-12 LAB — MAGNESIUM: Magnesium: 2.2 mg/dL (ref 1.5–2.5)

## 2014-03-12 MED ORDER — METOLAZONE 5 MG PO TABS: 2.5000 mg | ORAL_TABLET | Freq: Every day | ORAL | Status: DC

## 2014-03-12 NOTE — Telephone Encounter (Signed)
Lab results called to patient.  Told to decrease Metolazone to 2.5mg  qd and take an extra K+ x 3 days.  Recheck BMET and Mag in 6 days.  Will have done before her visit w/Luke on Thursday.  Order placed and mail to patient.

## 2014-03-12 NOTE — Telephone Encounter (Signed)
Patient aware.

## 2014-03-12 NOTE — Telephone Encounter (Signed)
[facility] REPORTED  STAT LABS ARE AVAILABLE. RN VERFIED LABS IN [facility]  RN NOTIFIED  Dr Sallyanne Kuster .

## 2014-03-12 NOTE — Telephone Encounter (Signed)
We'd figured that out already.Marland KitchenMarland KitchenMarland Kitchen

## 2014-03-12 NOTE — Progress Notes (Signed)
Patient ID: [name], female   DOB: May 26, 1950, 64 y.o.   MRN: [id number]     Reason for office visit Acute exacerbation of CHF follow up   Since her previous appointment a week ago, [name] has lost 13-1/2 pounds of fluid weight. The fluid in leaking from her legs has diminished. She has an appointment with the wound care center next Monday. Her breathing is better, but she feels a little "washed out and shaky". Her blood pressure is borderline low today. She had labs checked yesterday but they make no sense. Despite diuresis her creatinine has gone from 1.9 to 0.22, her BUN has decreased to 9, potassium is high at 5.1 and her venous CO2 has decreased from 36 to 19. I really worry that the blood specimen is not hers. We have reordered a stat sample for today.  [name] has moderately severe nonischemic cardiomyopathy with a left ventricular ejection fraction of 30-35%, severe mitral insufficiency and severe tricuspid insufficiency and moderate to severe pulmonary hypertension, recurrent persistent atrial fibrillation status post multiple cardioversions, obesity hypoventilation syndrome and diabetes mellitus. Because of her severe obesity and multiple comorbid conditions she is not a good candidate for surgical valve repair. She also has a single kidney and at least stage III chronic kidney disease and has had very volatile renal function parameters over the last couple of years. In the past has told me that under no circumstances will she her for consider Hemodialysis. She seems to have a more ambiguous position about hemodialysis. She sees Dr. Lorrene Reid in the nephrology clinic and Dr. Tobe Sos is her endocrinologist.   Allergies  Allergen Reactions  . Ivp Dye [Iodinated Diagnostic Agents] Shortness Of Breath    Turn red, can't breathe  . Betadine [Povidone Iodine]   . Diphenhydramine Hcl Swelling    In hands and eyes  . Fish Allergy Nausea And Vomiting  . Iohexol      Desc: PT TURNS RED AND  WHEEZING   . Penicillins Other (See Comments)    Resp arrest as child  . Povidone-Iodine Other (See Comments)    Wheezing, turn red, can't breath, and blisters  . Promethazine Hcl Other (See Comments)    Low Blood Pressure  . Tape     Blisters, can use paper tape for short periods  . Clindamycin Hives and Rash    wheezing  . Iodine Rash  . Morphine Sulfate Nausea And Vomiting and Rash    Current Outpatient Prescriptions  Medication Sig Dispense Refill  . albuterol (PROVENTIL HFA;VENTOLIN HFA) 108 (90 BASE) MCG/ACT inhaler Inhale 2 puffs into the lungs every 6 (six) hours as needed. For shortness of breath      . albuterol (PROVENTIL) (2.5 MG/3ML) 0.083% nebulizer solution Take 2.5 mg by nebulization every 6 (six) hours as needed. For shortness of breath      . ALPRAZolam (XANAX) 0.25 MG tablet Take 1 tablet (0.25 mg total) by mouth at bedtime as needed for anxiety.  30 tablet  3  . amiodarone (PACERONE) 200 MG tablet Take 1 tablet (200 mg total) by mouth 2 (two) times daily.  180 tablet  6  . calcitRIOL (ROCALTROL) 0.25 MCG capsule Take 0.25 mcg by mouth 2 (two) times daily.      . calcium citrate-vitamin D 200-200 MG-UNIT TABS Take 1 tablet by mouth 4 (four) times daily -  before meals and at bedtime.      Marland Kitchen COUMADIN 5 MG tablet Take 1 tablet by mouth daily or  as directed  90 tablet  1  . digoxin (LANOXIN) 0.125 MG tablet Take 0.5 tablets (0.0625 mg total) by mouth daily.  15 tablet  10  . doxycycline (VIBRAMYCIN) 100 MG capsule Take 1 capsule (100 mg total) by mouth twice daily for 10 days.  20 capsule  0  . glucose blood (ONE TOUCH ULTRA TEST) test strip Test 3 times daily  100 each  12  . glyBURIDE (DIABETA) 5 MG tablet Take 1-2 tablets (5-10 mg total) by mouth 2 (two) times daily with a meal. Take $RemoveBe'10mg'XagLKZmOU$  in the morning and $RemoveBef'5mg'cTGxJRmeoX$  at night  120 tablet  4  . HYDROcodone-acetaminophen (NORCO) 10-325 MG per tablet Take 1 tablet by mouth 2 (two) times daily as needed for pain.  90 tablet  5    . insulin glargine (LANTUS) 100 UNIT/ML injection Use up to 27 units daily      . Insulin Pen Needle (B-D UF III MINI PEN NEEDLES) 31G X 5 MM MISC Check blood sugar up to 6x daily  200 each  3  . iron polysaccharides (NIFEREX) 150 MG capsule Take 150 mg by mouth 2 (two) times daily.      Marland Kitchen JANUVIA 100 MG tablet TAKE 1 TABLET DAILY.  30 tablet  4  . levothyroxine (SYNTHROID, LEVOTHROID) 125 MCG tablet Take 125 mcg by mouth daily before breakfast.      . metolazone (ZAROXOLYN) 5 MG tablet Take 1 tablet (5 mg total) by mouth daily.  30 tablet  11  . metoprolol tartrate (LOPRESSOR) 25 MG tablet Take 1.5 tablets (37.5 mg total) by mouth 2 (two) times daily.  90 tablet  11  . Multiple Vitamin (MULTIVITAMIN) tablet Take 1 tablet by mouth daily.       . nitroGLYCERIN (NITRODUR - DOSED IN MG/24 HR) 0.4 mg/hr patch Place 1 patch (0.4 mg total) onto the skin every other day.  30 patch  6  . nitroGLYCERIN (NITROSTAT) 0.4 MG SL tablet Place 0.4 mg under the tongue every 5 (five) minutes as needed. For chest pain      . potassium chloride SA (K-DUR,KLOR-CON) 20 MEQ tablet Take 1 tablet (20 mEq total) by mouth 4 (four) times daily.  120 tablet  11  . tiotropium (SPIRIVA) 18 MCG inhalation capsule Place 18 mcg into inhaler and inhale daily.      Marland Kitchen torsemide (DEMADEX) 100 MG tablet Take 1 tablet (100 mg total) by mouth 2 (two) times daily.  60 tablet  11  . Vitamin D, Ergocalciferol, (DRISDOL) 50000 UNITS CAPS capsule Takes on tuesdays and fridays  30 capsule  12  . VOLTAREN 1 % GEL 4 (four) times daily as needed.       No current facility-administered medications for this visit.    Past Medical History  Diagnosis Date  . Personal history of other diseases of circulatory system   . 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 Chronic lymphocytic thyroiditis   . Morbid obesity   . Other chronic pulmonary heart diseases   . Obstructive sleep apnea (adult) (pediatric)   . Other and unspecified  hyperlipidemia   . Fibromyalgia   . Goiter   . Thyroiditis, autoimmune   . Hypothyroidism, acquired, autoimmune   . Solitary kidney, acquired   . Fatigue   . Hyperparathyroidism, secondary   . Vitamin D deficiency disease   . Hyperparathyroidism   . H/O varicose veins 03/27/12  . Type II or unspecified type diabetes mellitus without  mention of complication, not stated as uncontrolled   . Diabetes mellitus type II   . Infections of kidney 03/27/12  . History of measles, mumps, or rubella 03/27/12  . Tubal pregnancy 12/23/76  . Unspecified essential hypertension     20 yrs ago  . Blood transfusion 1977    [facility]  . Complication of anesthesia 2010    hard to wake up after knee surgery  . Anemia   . Cardiomyopathy, nonischemic     EF 45 %  . Endometrial hyperplasia with atypia 05/05/12  . Diastolic HF (heart failure) 11/05/2012  . A-fib   . Echocardiogram abnormal 07/15/12    severe MR,mod to severe TR,mod to severe PA hypertension  . Pulmonary arterial hypertension   . PAF (paroxysmal atrial fibrillation), 1st episode since DCCV in Jan/14 03/19/2013  . Ulcers of both lower extremities, small, now with cellulitis 06/05/2013    Past Surgical History  Procedure Laterality Date  . Cholecystectomy    . Pacemaker placement  02/26/11    Medtronic Adapta  . Knee surgery    . Resection duplicate left kidney    . 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 Hernia repair  1999    ruptured ;  . D& c with hysteroscopy & cervical polypectomy  05/05/12  . Cardioversion N/A 02/06/2013    Successful  . Iud removal  01/30/13  . Ectopic pregnancy surgery  1978  . Cardioversion N/A 06/08/2013    Procedure: CARDIOVERSION;  Surgeon: Sanda Klein, MD;  Location: [place];  Service: Cardiovascular;  Laterality: N/A;  Room [phone]  . Cardioversion N/A 02/01/2014    Procedure: CARDIOVERSION;  Surgeon: Sanda Klein, MD;  Location: [facility];  Service: Cardiovascular;  Laterality: N/A;    Family  History  Problem Relation Age of Onset  . Cardiomyopathy Brother   . Diabetes Mother   . Cancer Maternal Grandmother   . Heart attack Mother     History   Social History  . Marital Status: Married    Spouse Name: N/A    Number of Children: N/A  . Years of Education: N/A   Occupational History  . retired Building services engineer    Social History Main Topics  . Smoking status: Never Smoker   . Smokeless tobacco: Never Used  . Alcohol Use: No  . Drug Use: No  . Sexual Activity: Yes    Partners: Male    Birth Control/ Protection: Post-menopausal   Other Topics Concern  . Not on file   Social History Narrative  . No narrative on file    Review of systems: Severe dyspnea with minimal exertion, possible mild orthopnea, severe lower showed edema with active weeping and multiple skin wounds, no pathological bleeding, no chest pain, no syncope or palpitations, no fever or chills, no change in bowel pattern, no abdominal pain, recent nausea and vomiting that has resolved, no urinary complaints, no vaginal bleeding, no hot flashes, no new neurological complaints   PHYSICAL EXAM BP 94/68  Pulse 72  Ht $R'5\' 6"'JY$  (1.676 m)  Wt 150.776 kg (332 lb 6.4 oz)  BMI 53.68 kg/m2 General appearance: alert, cooperative, no distress and morbidly obese  Neck: no adenopathy, no carotid bruit, supple, symmetrical, trachea midline, thyroid not enlarged, symmetric, no tenderness/mass/nodules and It difficult to see jugular venous pulsations due to obesity  Lungs: clear to auscultation bilaterally  Heart: regular rate and rhythm, S1: split, S2: wide splitting, S3 present and systolic murmur: holosystolic 2/6, blowing at  apex  Abdomen: soft, non-tender; bowel sounds normal; no masses, no organomegaly  Extremities: edema 3+ symmetrically bilaterally and Multiple 1-5 cm ulcers with active weeping and mild bleeding bilaterally, blisters with overlying devitalized skin  Pulses: 2+ and symmetric  Skin: Ulcers as described  above, rounding erythema is worsening and very concerning for cellulitis  Neurologic: Alert and oriented X 3, normal strength and tone. Normal symmetric reflexes. Normal coordination and gait   EKG: 100% atrioventricular paced by pacemaker interrogation   Lipid Panel     Component Value Date/Time   CHOL 149 11/30/2013 0942   TRIG 137 11/30/2013 0942   HDL 33* 11/30/2013 0942   CHOLHDL 4.5 11/30/2013 0942   VLDL 27 11/30/2013 0942   LDLCALC 89 11/30/2013 0942    BMET - please note that most recent labs from March 12 are suspected to be erroneous!    Component Value Date/Time   NA 138 03/11/2014 1409   K 5.1 03/11/2014 1409   CL 104 03/11/2014 1409   CO2 19 03/11/2014 1409   GLUCOSE 86 03/11/2014 1409   BUN 5* 03/11/2014 1409   CREATININE 0.22* 03/11/2014 1409   CREATININE 2.19* 09/21/2013 1002   CREATININE 1.28* 02/12/2011 1000   CALCIUM 10.8* 03/11/2014 1409   CALCIUM 9.5 03/24/2012 1212   GFRNONAA 22* 09/21/2013 1002   GFRAA 26* 09/21/2013 1002     ASSESSMENT AND PLAN Chronic combined systolic and diastolic HF (heart failure)  Acute exacerbation is improving. Decompensated biventricular heart failure in a patient with 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 Ventricular dysfunction may be partly related to obliterate for a right ventricular pacing secondary to complete heart block (however pacing proceeds cardiomyopathy by many years).  Continue torsemide 100 mg twice daily. Reduce metolazone to 2.5 mg daily. Recheck labs STAT. Follow up in clinic next week.  Unable to treat with ACE inhibitors. On a moderate dose of beta blocker.  CKD (chronic kidney disease) stage 4, GFR 15-29 ml/min  High likelihood that she'll develop acute worsening of renal function with more aggressive diuresis. I asked her about dialysis today. She did not give me a straight answer. It sounds like she dreads the prospect, but has not  ruled it out entirely.  Ulcers of both lower extremities  I suspect that these are related to chronic edema/venous stasis due to a combination of venous insufficiency and severe right heart failure. I have asked her to stop putting hydrocortisone on the wound since this will be counterproductive. It will further thin the skin increase the likelihood of ulcers and prevent healing.  I worry that she has developed some degree of infection and prescribed doxycycline 100 mg twice a day.  Appt Monday 2/16 at wound care clinic  Atrial fibrillation, persistent  Despite successfully maintaining sinus rhythm, her heart failure has worsened. We'll reduce the amiodarone to 400 mg twice a day to reduce the likelihood of side effects. The reduction in the overall dose may lead to increased warfarin requirements, but treat him with doxycycline may move to the INR in the opposite direction. We'll have to check her INR more frequently. Note increased risk of digoxin toxicity with aggressive diuresis and concomitant treatment with amiodarone. May have to temporarily stop the digoxin if her renal function deteriorates or she develops severe hypokalemia.  PACEMAKER, PERMANENT  Normal device function. She is pacemaker dependent. No changes were made the device settings. Consider referral for cardiac resynchronization therapy once we  get her more stabilized. Also consider referral to the CHF clinic.  Orders Placed This Encounter  Procedures  . Comp Met (CMET)     Cherith Tewell  Sanda Klein, MD, [facility] [phone] office [phone] pager

## 2014-03-12 NOTE — Telephone Encounter (Signed)
CMP Mg  Blood tested and had a mis-pour.  Stated they reported someone else's results.  MD was informed and had pt repeat labs STAT.  Recollected on 13th (today) and results will be amended.  Stated they did repeat STAT labs today to confirm they are correct.  Stated pt's account will be credited for yesterday's labs b/c it was a lab order.  Will fax results and update in [facility].  Asked that MD be notified and informed he will be.  Message forwarded to Dr. Sallyanne Kuster. Pamala Hurry, CMA verbally notified.

## 2014-03-12 NOTE — Patient Instructions (Signed)
Your physician recommends that you return for lab work in:  NOW STAT CMET.  Your physician recommends that you schedule a follow-up appointment in:  [place] [facility].

## 2014-03-12 NOTE — Telephone Encounter (Signed)
Labs reordered STAT for Thursday 03/18/14.

## 2014-03-15 ENCOUNTER — Encounter (HOSPITAL_BASED_OUTPATIENT_CLINIC_OR_DEPARTMENT_OTHER): Payer: Medicare Other | Attending: Plastic Surgery

## 2014-03-15 ENCOUNTER — Telehealth: Payer: Self-pay | Admitting: "Endocrinology

## 2014-03-15 DIAGNOSIS — I509 Heart failure, unspecified: Secondary | ICD-10-CM | POA: Insufficient documentation

## 2014-03-15 DIAGNOSIS — I872 Venous insufficiency (chronic) (peripheral): Secondary | ICD-10-CM | POA: Insufficient documentation

## 2014-03-15 DIAGNOSIS — I890 Lymphedema, not elsewhere classified: Secondary | ICD-10-CM | POA: Insufficient documentation

## 2014-03-15 DIAGNOSIS — L97809 Non-pressure chronic ulcer of other part of unspecified lower leg with unspecified severity: Secondary | ICD-10-CM | POA: Insufficient documentation

## 2014-03-15 DIAGNOSIS — I8390 Asymptomatic varicose veins of unspecified lower extremity: Secondary | ICD-10-CM | POA: Insufficient documentation

## 2014-03-15 NOTE — Telephone Encounter (Signed)
Received telephone call from [place]. 1. Overall status: She did get to the [place] today. Because she had had an adverse reaction to tan Una boot in the past, they had to wrap her legs. 2. New problems: None 3. Lantus dose: 27 units 4. Glyburide, 10 mg in AM and 5 mg in PM; Januvia 100 mg in AM 5. BG log: 2 AM, Breakfast, Lunch, Supper, Bedtime 03/13/14: xxx, 140, xxx, 159, 192 03/14/14: xxx, 128, xxx, 144, 148 03/15/14: xxx, 137, 108, [facility] 188, 208 6. Assessment: Considering all of her problems, the BGs are pretty good.  7. Plan: Return to [place] on Monday.  8. FU call: Wednesday evening next week. Sherrlyn Hock

## 2014-03-16 ENCOUNTER — Telehealth: Payer: Self-pay | Admitting: *Deleted

## 2014-03-16 ENCOUNTER — Encounter: Payer: Self-pay | Admitting: Cardiovascular Disease

## 2014-03-16 DIAGNOSIS — Z79899 Other long term (current) drug therapy: Secondary | ICD-10-CM

## 2014-03-16 DIAGNOSIS — R6889 Other general symptoms and signs: Secondary | ICD-10-CM

## 2014-03-16 NOTE — Consult Note (Signed)
[name], [name]             ACCOUNT NO.:  [id number]  MEDICAL RECORD NO.:  [id number]  LOCATION:  FOOT                         FACILITY:  [facility]  PHYSICIAN:  Irene Limbo, MD   DATE OF BIRTH:  1950-02-09  DATE OF CONSULTATION:  03/15/2014 DATE OF DISCHARGE:                                CONSULTATION   CHIEF COMPLAINT:  Bilateral lower extremity ulcerations.  HISTORY OF PRESENT ILLNESS:  The patient is a 64 year old ambulatory female that presents with approximately 87-month history of bilateral lower extremities ulcerations.  The patient states that she developed cellulitis and blisters over both lower extremities and was treated with doxycycline.  She reports longstanding history of lower extremities edema and varicosities.  She denies any recent studies of her lower extremities to evaluate for clots or reflux.  She reports that she wears compression stockings daily, though she has not worn them since the outbreak of these ulcerations.  She does not recall the strength of her compression stockings.  She has never used a lymphedema pump.  History is significant for rapid AFib with associated congestive heart failure and was cardioverted in February 2015.  PAST MEDICAL HISTORY:  Varicosities of lower extremities, chronic lymphocytic thyroiditis, diabetes mellitus type 2, hyperparathyroidism, fibromyalgia, obstructive sleep apnea, nonischemic cardiomyopathy with an EF of 26%, diastolic heart failure, atrial fibrillation, post cardioversion, severe pulmonary artery hypertension noted on echocardiogram.  Review of her chart also indicates the presence of ulcer in both extremities as far back as June 2014.  MEDICATIONS: 1. Proventil. 2. Xanax. 3. Amiodarone. 4. Coumadin. 5. Digoxin. 6. Glyburide. 7. Norco. 8. Synthroid. 9. Zaroxolyn. 10.Lopressor. 11.Multivitamin. 12.Nitrostat. 13.Potassium chloride. 14.Spiriva. 15.Torsemide.  ALLERGIES:  IV DYE, BETADINE,  BENADRYL, PENICILLINS, CLINDAMYCIN, MORPHINE, and TAPE.  SOCIAL HISTORY:  The patient is a nonsmoker.  LABORATORY DATA:  Review of recent laboratory includes a hemoglobin A1c of 7.  PHYSICAL EXAMINATION:  VITAL SIGNS:  Blood pressure is 97/66, pulse is 72, temperature is 97.7, weight is 319 pounds.  Height is 5 feet 6 inches.  Blood glucose is 110. EXTREMITIES:  ABI on the right is measured as 1.4, on the left is 0.9. She has positive sensation over the right plantar surface over the metatarsal heads and toes.  There is absent sensation over the lateral foot and heel.  The left foot is a similar examination.  Right calf measures 51 cm, right ankle 38 cm, left calf is 59 cm, left ankle is 36 cm.  She has palpable dorsalis pedis bilaterally.  She has positive Dopplers over the dorsalis pedis.  The posterior tibialis was not tested.  She has superficial clusters, ulcerations over bilateral lower extremities over the right medial posterior lower extremity, wound size is 2.8 x 8.4 x 0.1; left anterior distal 2.5 x 4.2 x 0.1; left medial posterior lower extremity 3.3 x 1.6 x 0.1; left anterior proximal 1.5 x 4.5 x 0.1 cm.  Selective debridement was performed over the entirety of the wound after application of local anesthetic.  Debridement was performed to the left anterior proximal, left anterior distal, and right medial posterior wounds.  We will plan collagen dressing followed by a Profore Lite wrap.  Of note, the patient  reports a history of use of Unna boots in the past and states that these had to be removed in 2 days for a rash and the feeling that they were too tight.  We will also order Juxta-Fit for compression as I anticipate that she will have difficulty with the wraps given the shape of her legs and her significant obesity and panniculi present around the level of the knees.  We will also order lymphedema pump for home use.  We discussed ultrasound of the lower extremities to  assess for reflux and DVTs.  She would like to put this on hold at present as she states she has multiple other doctors' visits and would be unable to make this appointment.  We will revisit that in a few week's time.  Plan for followup in 1 week's time.          ______________________________ Irene Limbo, MD MBA     BT/MEDQ  D:  03/15/2014  T:  03/16/2014  Job:  [id number]

## 2014-03-16 NOTE — Telephone Encounter (Signed)
Message copied by Tressa Busman on Tue Mar 16, 2014  3:16 PM ------      Message from: Sanda Klein      Created: Tue Mar 16, 2014 11:26 AM       K is acceptable, creat has increased, as was expected. I would recommend staying on the same torsemide dose and reduce metolazone to 2.5 mg three days a week (Monday, Wednesday, Friday). Check BMET, intact PTH in 1 week. ------

## 2014-03-16 NOTE — Progress Notes (Signed)
[facility] - left message with lab results and med instructions - same dose of Torsemide and reduce Metolazone to 2.5mg Mon/Wed/Fri.  Recheck labs in 1 week.  Asked that she call me back to verify she received this message. 

## 2014-03-16 NOTE — Telephone Encounter (Signed)
[facility] - left message with lab results and med instructions - same dose of Torsemide and reduce Metolazone to 2.5mg  Mon/Wed/Fri.  Recheck labs in 1 week.  Asked that she call me back to verify she received this message.

## 2014-03-17 ENCOUNTER — Telehealth: Payer: Self-pay | Admitting: *Deleted

## 2014-03-17 NOTE — Telephone Encounter (Signed)
Pt was calling in regards to [facility] calling her with her results. The patient stated that she does not remember having the blood work drawn.   [place]

## 2014-03-17 NOTE — Telephone Encounter (Signed)
Returned call and pt verified x 2.  Pt informed message received.  Pt stated she talked w/ Pamala Hurry and Dr. Loletha Grayer on Friday and has decreased Zaroxolyn and took the extra 3 days of K+.  Stated Pamala Hurry told her she needed to get labs done tomorrow and pt wanted to make sure she heard her correctly.  Pt informed that is correct and orders have already been placed for [facility].  Pt verbalized understanding and stated she will go at 7 am to have labs drawn before her appt w/ Lurena Joiner tomorrow.

## 2014-03-18 ENCOUNTER — Telehealth: Payer: Self-pay | Admitting: *Deleted

## 2014-03-18 ENCOUNTER — Telehealth: Payer: Self-pay | Admitting: Cardiovascular Disease

## 2014-03-18 ENCOUNTER — Ambulatory Visit (INDEPENDENT_AMBULATORY_CARE_PROVIDER_SITE_OTHER): Payer: Medicare Other | Admitting: Cardiology

## 2014-03-18 ENCOUNTER — Encounter: Payer: Self-pay | Admitting: Cardiology

## 2014-03-18 ENCOUNTER — Ambulatory Visit (INDEPENDENT_AMBULATORY_CARE_PROVIDER_SITE_OTHER): Payer: Medicare Other | Admitting: *Deleted

## 2014-03-18 VITALS — BP 110/70 | HR 80 | Ht 66.0 in | Wt 323.3 lb

## 2014-03-18 DIAGNOSIS — Z7901 Long term (current) use of anticoagulants: Secondary | ICD-10-CM

## 2014-03-18 DIAGNOSIS — Z79899 Other long term (current) drug therapy: Secondary | ICD-10-CM

## 2014-03-18 DIAGNOSIS — I480 Paroxysmal atrial fibrillation: Secondary | ICD-10-CM

## 2014-03-18 DIAGNOSIS — I5042 Chronic combined systolic (congestive) and diastolic (congestive) heart failure: Secondary | ICD-10-CM

## 2014-03-18 DIAGNOSIS — L97911 Non-pressure chronic ulcer of unspecified part of right lower leg limited to breakdown of skin: Secondary | ICD-10-CM

## 2014-03-18 DIAGNOSIS — N184 Chronic kidney disease, stage 4 (severe): Secondary | ICD-10-CM

## 2014-03-18 DIAGNOSIS — L97921 Non-pressure chronic ulcer of unspecified part of left lower leg limited to breakdown of skin: Secondary | ICD-10-CM

## 2014-03-18 DIAGNOSIS — E119 Type 2 diabetes mellitus without complications: Secondary | ICD-10-CM

## 2014-03-18 DIAGNOSIS — I4891 Unspecified atrial fibrillation: Secondary | ICD-10-CM

## 2014-03-18 DIAGNOSIS — Z950 Presence of cardiac pacemaker: Secondary | ICD-10-CM

## 2014-03-18 DIAGNOSIS — L97909 Non-pressure chronic ulcer of unspecified part of unspecified lower leg with unspecified severity: Secondary | ICD-10-CM

## 2014-03-18 LAB — MAGNESIUM: Magnesium: 2.3 mg/dL (ref 1.5–2.5)

## 2014-03-18 LAB — BASIC METABOLIC PANEL
BUN: 119 mg/dL — ABNORMAL HIGH (ref 6–23)
CALCIUM: 9.4 mg/dL (ref 8.4–10.5)
CHLORIDE: 91 meq/L — AB (ref 96–112)
CO2: 33 meq/L — AB (ref 19–32)
CREATININE: 2.59 mg/dL — AB (ref 0.50–1.10)
GLUCOSE: 112 mg/dL — AB (ref 70–99)
Potassium: 3.5 mEq/L (ref 3.5–5.3)
Sodium: 138 mEq/L (ref 135–145)

## 2014-03-18 LAB — POCT INR: INR: 1.9

## 2014-03-18 NOTE — Patient Instructions (Signed)
Take Zaroxolyn 2.5 mg if needed for Wgt of 327 or greater. Take daily till your wgt gets back to 325. Return in two weeks

## 2014-03-18 NOTE — Assessment & Plan Note (Signed)
BMI 52 

## 2014-03-18 NOTE — Telephone Encounter (Signed)
Message copied by Tressa Busman on Thu Mar 18, 2014  1:49 PM ------      Message from: Sanda Klein      Created: Thu Mar 18, 2014 11:44 AM       K finally in normal range, albeit low normal. Creatinine about the same. Has leg wound weeping stopped? If yes, just continue same meds and recheck BMET in another week. If no longer weeping, can try to reduce torsemide to 100 mg in AM and 75 mg in PM ------

## 2014-03-18 NOTE — Assessment & Plan Note (Signed)
No change 

## 2014-03-18 NOTE — Telephone Encounter (Signed)
Spoke to [employer]. She reported STAT LABS ARE AVAILABLE TO REVIEW IN EPIC. Will forward to [employer] PA

## 2014-03-18 NOTE — Assessment & Plan Note (Signed)
Followed at wound center

## 2014-03-18 NOTE — Assessment & Plan Note (Signed)
NSR on Amiodarone 400 mg daily

## 2014-03-18 NOTE — Telephone Encounter (Signed)
To have BMET done before appt in 2 weeks with Kerin Ransom, [place].  Order mailed to patient.

## 2014-03-18 NOTE — Progress Notes (Signed)
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 Other problems include, PAF- holding NSR on Amio 200 mg BID, prior pacemaker, Morbid obesity, chronic LE edema, stage 3 chronic renal insufficiency, and DM. Because of her severe obesity and multiple comorbid conditions she is not a good candidate for surgical valve repair. She also has a single kidney and at least stage III chronic kidney disease and has had very volatile renal function parameters over the last couple of years. In the past has told me that under no circumstances will she her for consider Hemodialysis. She is followed by Dr Lorrene Reid. She has chronic massive LE edema and has been going to the wound center for care although she tells me at times she gets frustrated with them. She recently finished a course of Doxycycline. Dr Sallyanne Kuster recently increased her Demadex and added Zaroxolyn 2.5 mg daily. She is here today for follow up. She says she feels "washed out".  Her wgt is now down 323.     Current Outpatient Prescriptions  Medication Sig Dispense Refill  . albuterol (PROVENTIL HFA;VENTOLIN HFA) 108 (90 BASE) MCG/ACT inhaler Inhale 2 puffs into the lungs every 6 (six) hours as needed. For shortness of breath      . albuterol (PROVENTIL) (2.5 MG/3ML) 0.083% nebulizer solution Take 2.5 mg by nebulization every 6 (six) hours as needed. For shortness of breath      . ALPRAZolam (XANAX) 0.25 MG tablet Take 1 tablet (0.25 mg total) by mouth at bedtime as needed for anxiety.  30 tablet  3  . amiodarone (PACERONE) 200 MG tablet Take 1 tablet (200 mg total) by mouth 2 (two) times daily.  180 tablet  6  . calcitRIOL (ROCALTROL) 0.25 MCG capsule Take 0.25 mcg by mouth 2  (two) times daily.      . calcium citrate-vitamin D 200-200 MG-UNIT TABS Take 1 tablet by mouth 4 (four) times daily -  before meals and at bedtime.      Marland Kitchen COUMADIN 5 MG tablet Take 1 tablet by mouth daily or as directed  90 tablet  1  . digoxin (LANOXIN) 0.125 MG tablet Take 0.5 tablets (0.0625 mg total) by mouth daily.  15 tablet  10  . doxycycline (VIBRAMYCIN) 100 MG capsule Take 1 capsule (100 mg total) by mouth twice daily for 10 days.  20 capsule  0  . glucose blood (ONE TOUCH ULTRA TEST) test strip Test 3 times daily  100 each  12  . glyBURIDE (DIABETA) 5 MG tablet Take 1-2 tablets (5-10 mg total) by mouth 2 (two) times daily with a meal. Take 10mg  in the morning and 5mg  at night  120 tablet  4  . HYDROcodone-acetaminophen (NORCO) 10-325 MG per tablet Take 1 tablet by mouth 2 (two) times daily as needed for pain.  90 tablet  5  . insulin glargine (LANTUS) 100 UNIT/ML injection Use up to 27 units daily      . Insulin Pen Needle (B-D UF III MINI PEN NEEDLES) 31G X 5 MM MISC Check blood sugar up to 6x daily  200 each  3  . iron polysaccharides (NIFEREX) 150 MG capsule Take 150 mg by mouth 2 (two) times daily.      Marland Kitchen  JANUVIA 100 MG tablet TAKE 1 TABLET DAILY.  30 tablet  4  . levothyroxine (SYNTHROID, LEVOTHROID) 125 MCG tablet Take 125 mcg by mouth daily before breakfast.      . metolazone (ZAROXOLYN) 5 MG tablet Take 0.5 tablets (2.5 mg total) by mouth daily.  30 tablet  11  . metoprolol tartrate (LOPRESSOR) 25 MG tablet Take 1.5 tablets (37.5 mg total) by mouth 2 (two) times daily.  90 tablet  11  . Multiple Vitamin (MULTIVITAMIN) tablet Take 1 tablet by mouth daily.       . nitroGLYCERIN (NITRODUR - DOSED IN MG/24 HR) 0.4 mg/hr patch Place 1 patch (0.4 mg total) onto the skin every other day.  30 patch  6  . nitroGLYCERIN (NITROSTAT) 0.4 MG SL tablet Place 0.4 mg under the tongue every 5 (five) minutes as needed. For chest pain      . potassium chloride SA (K-DUR,KLOR-CON) 20 MEQ tablet Take  1 tablet (20 mEq total) by mouth 4 (four) times daily.  120 tablet  11  . tiotropium (SPIRIVA) 18 MCG inhalation capsule Place 18 mcg into inhaler and inhale daily.      Marland Kitchen torsemide (DEMADEX) 100 MG tablet Take 1 tablet (100 mg total) by mouth 2 (two) times daily.  60 tablet  11  . Vitamin D, Ergocalciferol, (DRISDOL) 50000 UNITS CAPS capsule Takes on tuesdays and fridays  30 capsule  12  . VOLTAREN 1 % GEL 4 (four) times daily as needed.       No current facility-administered medications for this visit.    Allergies  Allergen Reactions  . Ivp Dye [Iodinated Diagnostic Agents] Shortness Of Breath    Turn red, can't breathe  . Betadine [Povidone Iodine]   . Diphenhydramine Hcl Swelling    In hands and eyes  . Fish Allergy Nausea And Vomiting  . Iohexol      Desc: PT TURNS RED AND WHEEZING   . Penicillins Other (See Comments)    Resp arrest as child  . Povidone-Iodine Other (See Comments)    Wheezing, turn red, can't breath, and blisters  . Promethazine Hcl Other (See Comments)    Low Blood Pressure  . Tape     Blisters, can use paper tape for short periods  . Clindamycin Hives and Rash    wheezing  . Iodine Rash  . Morphine Sulfate Nausea And Vomiting and Rash    History   Social History  . Marital Status: Married    Spouse Name: N/A    Number of Children: N/A  . Years of Education: N/A   Occupational History  . retired Building services engineer    Social History Main Topics  . Smoking status: Never Smoker   . Smokeless tobacco: Never Used  . Alcohol Use: No  . Drug Use: No  . Sexual Activity: Yes    Partners: Male    Birth Control/ Protection: Post-menopausal   Other Topics Concern  . Not on file   Social History Narrative  . No narrative on file     Review of Systems: General: negative for chills, fever, night sweats or weight changes.  Cardiovascular: negative for chest pain, dyspnea on exertion, edema, orthopnea, palpitations, paroxysmal nocturnal dyspnea or shortness  of breath Dermatological: negative for rash Respiratory: negative for cough or wheezing Urologic: negative for hematuria Abdominal: negative for nausea, vomiting, diarrhea, bright red blood per rectum, melena, or hematemesis Neurologic: negative for visual changes, syncope, or dizziness All other systems reviewed and are otherwise  negative except as noted above.    Blood pressure 110/70, pulse 80, height 5\' 6"  (1.676 m), weight 323 lb 4.8 oz (146.648 kg).  General appearance: alert, cooperative, no distress and morbidly obese Lungs: clear to auscultation bilaterally Heart: regular rate and rhythm Extremities: bilat massive edema    ASSESSMENT AND PLAN:   Chronic combined systolic and diastolic HF (heart failure) Her wgt is down to 323.  CKD (chronic kidney disease) stage 4, GFR 15-29 ml/min No change  PAF (paroxysmal atrial fibrillation) NSR on Amiodarone 400 mg daily  Ulcers of both lower extremities Followed at wound center  PACEMAKER, PERMANENT .  OBESITY, MORBID BMI 52  Type 2 diabetes mellitus .   PLAN  I suggested we now switch to a prn dose of Zaroxolyn for wgt greater than 327. I think a good goal wgt is 325. Her K+ was a little low but this may come up off Zaroxolyn. She'll return in two weeks for follow up.   [name] KPA-C 03/18/2014 1:30 PM

## 2014-03-18 NOTE — Assessment & Plan Note (Signed)
Her wgt is down to 323.

## 2014-03-22 ENCOUNTER — Telehealth: Payer: Self-pay | Admitting: *Deleted

## 2014-03-22 NOTE — Telephone Encounter (Signed)
Patient notified we will cancel the PTH, intact and she can mark it off the order.  Voiced understanding.  Order cancelled.

## 2014-03-22 NOTE — Telephone Encounter (Signed)
Message copied by Tressa Busman on Mon Mar 22, 2014  9:17 AM ------      Message from: Sanda Klein      Created: Fri Mar 19, 2014  6:10 PM       No, thank you. Cancel PTH      MC      ----- Message -----         From: Tressa Busman, CMA         Sent: 03/19/2014   5:01 PM           To: Sanda Klein, MD            Results called to patient voiced understanding.  Just had a PTH,intact done 03/05/14 ordered by Dr. Tobe Sos.  Do you want another one this soon?       ------

## 2014-03-23 NOTE — Progress Notes (Signed)
[facility]  NAME:  [name], [name]                  ACCOUNT NO.:  MEDICAL RECORD NO.:  [id number]      DATE OF BIRTH:  1950/03/01  PHYSICIAN:  Irene Limbo, MD    VISIT DATE:  03/22/2014                                  OFFICE VISIT   CHIEF COMPLAINT:  Bilateral lower extremity ulcerations in the setting of venous stasis, varicosities, and lymphedema with associated heart failure.  The patient is a ambulatory female with history of bilateral lower extremity ulcerations present since February 09, 2014, during hospitalization for congestive heart failure.  Last week was her 1st visit to the wound center.  We started her on Profore Lite.  She has a history of Unna boots that she did not tolerate in the past. JuxtaFit compression was ordered and she has not received this yet.  We also discussed a lymphedema pump.  The order was not made and we will do that this week.  She reports that the wraps lasted approximately 4 days before they fell off and she tried to use an Ace wrap that did not work.  She has not had any compression in place. She does not want to have any wraps placed again or try an Haematologist. She reports a several pound weight loss due to diuretics over the last 2 weeks.  PHYSICAL EXAMINATION:  Blood pressure is 113/72, pulse 75, respirations 18, temperature is 98.6, blood glucose is 92.  Right calf is measured at 53 cm, right ankle 37, left calf 63 cm, left ankle is 37.5 cm.  The right medial posterior lower extremity wound is measured at 2.2 x 6.3 x 0.1 cm.  This is decreased since last visit.  The left anterior distal wound is 1.4 x 2.5 x 0.1 cm.  Left medial posterior is 1.8 x 0.9 x 0.1. Left anterior proximal is measured at 0.9 x 3.8 x 0.1 cm.  All of the wounds appear to be diminished in size.  This is despite the fact that her overall calf circumferences have increased since her initial visit Here.  The patient declines any compression  wraps. We will plan to continue with collagen and instructed her to bring her Juxta-Fit when there are in place.  We will also complete a lymphedema pump orders at this time.  At her last visit, the patient declined having ultrasounds for workup of stasis and varicosities.          ______________________________ Irene Limbo, MD MBA     BT/MEDQ  D:  03/22/2014  T:  03/23/2014  Job:  [id number]

## 2014-03-30 LAB — BASIC METABOLIC PANEL
BUN: 116 mg/dL — AB (ref 6–23)
CO2: 35 mEq/L — ABNORMAL HIGH (ref 19–32)
CREATININE: 2.37 mg/dL — AB (ref 0.50–1.10)
Calcium: 9 mg/dL (ref 8.4–10.5)
Chloride: 89 mEq/L — ABNORMAL LOW (ref 96–112)
Glucose, Bld: 178 mg/dL — ABNORMAL HIGH (ref 70–99)
Potassium: 3.6 mEq/L (ref 3.5–5.3)
Sodium: 134 mEq/L — ABNORMAL LOW (ref 135–145)

## 2014-03-30 NOTE — Progress Notes (Signed)
[facility]  NAME:  [name], [name]             ACCOUNT NO.:  [id number]  MEDICAL RECORD NO.:  [id number]      DATE OF BIRTH:  07/19/1950  PHYSICIAN:  Irene Limbo, MD    VISIT DATE:  03/29/2014                                  OFFICE VISIT   CHIEF COMPLAINT:  Followup of bilateral lower extremity ulcerations in the setting of lymphedema.  HISTORY OF PRESENT ILLNESS:  The patient is a 64 year old ambulatory female with morbid obesity that presents for recurrent ulcerations of her lower extremities.  Her current wound care has been collagen.  She has declined any wraps, and she has had previous bad experience with Unna boots and declines these as well.  She also declined ultrasound to examine her veins over bilateral lower extremities.  We did fax order previously for a lymphedema pump.  She reports that she has not been doing any dressing changes herself.  She reports she developed blistering underneath the adhesive foam dressing that was placed over her left lower extremity, and she has been using Telfa to the wounds alone since her last visit here.  EXAMINATION:  Blood pressure is 117/71, pulse is 72, blood glucose is 120, temperature is 97.9.  Right medial posterior lower extremity wound is measured at 5.7 x 2 x 0.1.  Left anterior distal wound is measured at 1.5 x 2 x 0.1.  Left medial posterior leg wound is measured at 2 x 1 x 0.1 cm, and the left anterior proximal wound is measured at 1 x 2.2 x 0.1 cm.  She has a new lesion over the right medial lower extremity measured at 1.5 x 1.7 x 0.1 cm.  Right calf measures 63 cm, right ankle 35 cm, left calf is 61 cm, left ankle is 36 cm.  Given the patient's inability to tolerate or comply with any compression I would like to move this patient to palliative care.  We will plan for collagen to all of her wounds, and she has elected to cover these with Telfa.  We instructed the patient that she needs to  complete wound care during the week, and if she is unable to do so, we will need to arrange for home health.  The patient reports that she will complete the wound care herself.  At today's visit, after application of topical anesthetic. selective debridement was performed to the right medial posterior wound alone.  This was completed with a curette to remove all the superficial slough present in the wound .  She tolerated this well.  We will plan for followup in 2 week's time.          ______________________________ Irene Limbo, MD MBA     BT/MEDQ  D:  03/29/2014  T:  03/30/2014  Job:  [id number]

## 2014-03-31 ENCOUNTER — Ambulatory Visit (INDEPENDENT_AMBULATORY_CARE_PROVIDER_SITE_OTHER): Payer: Medicare Other | Admitting: Cardiology

## 2014-03-31 ENCOUNTER — Encounter: Payer: Self-pay | Admitting: Cardiology

## 2014-03-31 ENCOUNTER — Ambulatory Visit (INDEPENDENT_AMBULATORY_CARE_PROVIDER_SITE_OTHER): Payer: Medicare Other | Admitting: Pharmacist Clinician (PhC)/ Clinical Pharmacy Specialist

## 2014-03-31 VITALS — BP 102/62 | HR 80 | Ht 66.0 in | Wt 329.0 lb

## 2014-03-31 DIAGNOSIS — I428 Other cardiomyopathies: Secondary | ICD-10-CM

## 2014-03-31 DIAGNOSIS — L97921 Non-pressure chronic ulcer of unspecified part of left lower leg limited to breakdown of skin: Secondary | ICD-10-CM

## 2014-03-31 DIAGNOSIS — L97909 Non-pressure chronic ulcer of unspecified part of unspecified lower leg with unspecified severity: Secondary | ICD-10-CM

## 2014-03-31 DIAGNOSIS — N184 Chronic kidney disease, stage 4 (severe): Secondary | ICD-10-CM

## 2014-03-31 DIAGNOSIS — Z7901 Long term (current) use of anticoagulants: Secondary | ICD-10-CM

## 2014-03-31 DIAGNOSIS — I5042 Chronic combined systolic (congestive) and diastolic (congestive) heart failure: Secondary | ICD-10-CM

## 2014-03-31 DIAGNOSIS — L97911 Non-pressure chronic ulcer of unspecified part of right lower leg limited to breakdown of skin: Secondary | ICD-10-CM

## 2014-03-31 LAB — POCT INR: INR: 3.9

## 2014-03-31 NOTE — Assessment & Plan Note (Signed)
Followed by Dr Lorrene Reid- last SCr 2.37

## 2014-03-31 NOTE — Patient Instructions (Signed)
Your physician recommends that you schedule a follow-up appointment in: 2 months with Dr. Croitoru. 

## 2014-03-31 NOTE — Assessment & Plan Note (Signed)
Wgt today 329, she appears to be compensated. She is taking Zaroxolyn prn based on her wgt as directed.

## 2014-03-31 NOTE — Progress Notes (Signed)
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 Other problems include, PAF- holding NSR on Amio 200 mg BID, prior pacemaker, Morbid obesity, chronic LE edema, stage 3 chronic renal insufficiency, and DM. Because of her severe obesity and multiple comorbid conditions she is not a good candidate for surgical valve repair. She also has a single kidney and at least stage III chronic kidney disease and has had very volatile renal function parameters over the last couple of years. In the past has told me that under no circumstances will she her for consider Hemodialysis. She is followed by Dr Lorrene Reid. She has chronic massive LE edema and has been going to the wound center for care although she tells me at times she gets frustrated with them. She recently finished a course of Doxycycline. Dr Sallyanne Kuster recently increased her Demadex and added Zaroxolyn 2.5 mg daily. I saw her 03/18/14.  She said she felt "washed out". Her wgt was down to 323. I suggested we switch to a prn dose of Zaroxolyn for wgt greater than 327. I think a good goal wgt is 325. She is here for follow up. She has been doing OK. She says she gets frustrated with the wound center but I told her I would defer Rx of her chronic leg edema and wounds to them. She will continue her PRN Zaroxolyn regimen.      Current Outpatient Prescriptions  Medication Sig Dispense Refill  . albuterol (PROVENTIL HFA;VENTOLIN HFA) 108 (90 BASE) MCG/ACT inhaler Inhale 2 puffs into the lungs every 6 (six) hours as needed. For shortness of breath      . albuterol (PROVENTIL) (2.5 MG/3ML) 0.083% nebulizer solution Take 2.5 mg by nebulization every 6 (six) hours as needed. For shortness of  breath      . ALPRAZolam (XANAX) 0.25 MG tablet Take 1 tablet (0.25 mg total) by mouth at bedtime as needed for anxiety.  30 tablet  3  . amiodarone (PACERONE) 200 MG tablet Take 1 tablet (200 mg total) by mouth 2 (two) times daily.  180 tablet  6  . calcitRIOL (ROCALTROL) 0.25 MCG capsule Take 0.25 mcg by mouth 2 (two) times daily.      . calcium citrate-vitamin D 200-200 MG-UNIT TABS Take 1 tablet by mouth 4 (four) times daily -  before meals and at bedtime.      Marland Kitchen COUMADIN 5 MG tablet Take 1 tablet by mouth daily or as directed  90 tablet  1  . digoxin (LANOXIN) 0.125 MG tablet Take 0.5 tablets (0.0625 mg total) by mouth daily.  15 tablet  10  . doxycycline (VIBRAMYCIN) 100 MG capsule Take 1 capsule (100 mg total) by mouth twice daily for 10 days.  20 capsule  0  . glucose blood (ONE TOUCH ULTRA TEST) test strip Test 3 times daily  100 each  12  . glyBURIDE (DIABETA) 5 MG tablet Take 1-2 tablets (5-10 mg total) by mouth 2 (two) times daily with a meal. Take 10mg  in the morning and 5mg  at night  120 tablet  4  . HYDROcodone-acetaminophen (NORCO) 10-325 MG per tablet Take 1 tablet by mouth 2 (two) times daily as needed for pain.  90 tablet  5  . insulin  glargine (LANTUS) 100 UNIT/ML injection Use up to 27 units daily      . Insulin Pen Needle (B-D UF III MINI PEN NEEDLES) 31G X 5 MM MISC Check blood sugar up to 6x daily  200 each  3  . iron polysaccharides (NIFEREX) 150 MG capsule Take 150 mg by mouth 2 (two) times daily.      Marland Kitchen JANUVIA 100 MG tablet TAKE 1 TABLET DAILY.  30 tablet  4  . levothyroxine (SYNTHROID, LEVOTHROID) 125 MCG tablet Take 125 mcg by mouth daily before breakfast.      . metolazone (ZAROXOLYN) 5 MG tablet Take 0.5 tablets (2.5 mg total) by mouth daily.  30 tablet  11  . metoprolol tartrate (LOPRESSOR) 25 MG tablet Take 1.5 tablets (37.5 mg total) by mouth 2 (two) times daily.  90 tablet  11  . Multiple Vitamin (MULTIVITAMIN) tablet Take 1 tablet by mouth daily.       .  nitroGLYCERIN (NITRODUR - DOSED IN MG/24 HR) 0.4 mg/hr patch Place 1 patch (0.4 mg total) onto the skin every other day.  30 patch  6  . nitroGLYCERIN (NITROSTAT) 0.4 MG SL tablet Place 0.4 mg under the tongue every 5 (five) minutes as needed. For chest pain      . potassium chloride SA (K-DUR,KLOR-CON) 20 MEQ tablet Take 1 tablet (20 mEq total) by mouth 4 (four) times daily.  120 tablet  11  . tiotropium (SPIRIVA) 18 MCG inhalation capsule Place 18 mcg into inhaler and inhale daily.      Marland Kitchen torsemide (DEMADEX) 100 MG tablet Take 1 tablet (100 mg total) by mouth 2 (two) times daily.  60 tablet  11  . Vitamin D, Ergocalciferol, (DRISDOL) 50000 UNITS CAPS capsule Takes on tuesdays and fridays  30 capsule  12  . VOLTAREN 1 % GEL 4 (four) times daily as needed.       No current facility-administered medications for this visit.    Allergies  Allergen Reactions  . Ivp Dye [Iodinated Diagnostic Agents] Shortness Of Breath    Turn red, can't breathe  . Betadine [Povidone Iodine]   . Diphenhydramine Hcl Swelling    In hands and eyes  . Fish Allergy Nausea And Vomiting  . Iohexol      Desc: PT TURNS RED AND WHEEZING   . Penicillins Other (See Comments)    Resp arrest as child  . Povidone-Iodine Other (See Comments)    Wheezing, turn red, can't breath, and blisters  . Promethazine Hcl Other (See Comments)    Low Blood Pressure  . Tape     Blisters, can use paper tape for short periods  . Clindamycin Hives and Rash    wheezing  . Iodine Rash  . Morphine Sulfate Nausea And Vomiting and Rash    History   Social History  . Marital Status: Married    Spouse Name: N/A    Number of Children: N/A  . Years of Education: N/A   Occupational History  . retired Building services engineer    Social History Main Topics  . Smoking status: Never Smoker   . Smokeless tobacco: Never Used  . Alcohol Use: No  . Drug Use: No  . Sexual Activity: Yes    Partners: Male    Birth Control/ Protection: Post-menopausal    Other Topics Concern  . Not on file   Social History Narrative  . No narrative on file     Review of Systems: General: negative for chills, fever, night  sweats or weight changes.  Cardiovascular: negative for chest pain, dyspnea on exertion, edema, orthopnea, palpitations, paroxysmal nocturnal dyspnea or shortness of breath Dermatological: negative for rash Respiratory: negative for cough or wheezing Urologic: negative for hematuria Abdominal: negative for nausea, vomiting, diarrhea, bright red blood per rectum, melena, or hematemesis Neurologic: negative for visual changes, syncope, or dizziness All other systems reviewed and are otherwise negative except as noted above.    Blood pressure 102/62, pulse 80, height 5\' 6"  (1.676 m), weight 329 lb (149.233 kg).  General appearance: alert, cooperative, no distress and morbidly obese Lungs: clear to auscultation bilaterally Heart: regular rate and rhythm Extremities: massive lymphadema both LE with dressed ulcers on both legs   ASSESSMENT AND PLAN:   Chronic combined systolic and diastolic HF (heart failure) Wgt today 329, she appears to be compensated. She is taking Zaroxolyn prn based on her wgt as directed.  CKD (chronic kidney disease) stage 4, GFR 15-29 ml/min Followed by Dr Lorrene Reid- last SCr 2.37  Ulcers of both lower extremities Followed at the wound clinic  Cardiomyopathy, nonischemic- EF 30-35% June 2014 .  OBESITY, MORBID .   PLAN  Same Rx, follow up with Dr Lorrene Reid and Dr Sallyanne Kuster.  Shawnae Leiva KPA-C 03/31/2014 1:53 PM

## 2014-03-31 NOTE — Assessment & Plan Note (Signed)
Followed at the wound clinic

## 2014-04-09 ENCOUNTER — Encounter: Payer: Self-pay | Admitting: Internal Medicine

## 2014-04-09 ENCOUNTER — Ambulatory Visit (INDEPENDENT_AMBULATORY_CARE_PROVIDER_SITE_OTHER): Payer: Medicare Other | Admitting: Internal Medicine

## 2014-04-09 VITALS — BP 118/70 | HR 74 | Ht 67.0 in | Wt 318.0 lb

## 2014-04-09 DIAGNOSIS — R0602 Shortness of breath: Secondary | ICD-10-CM

## 2014-04-09 DIAGNOSIS — G4733 Obstructive sleep apnea (adult) (pediatric): Secondary | ICD-10-CM

## 2014-04-09 DIAGNOSIS — E662 Morbid (severe) obesity with alveolar hypoventilation: Secondary | ICD-10-CM

## 2014-04-09 NOTE — Progress Notes (Signed)
Subjective:    Patient ID: [name], female    DOB: [birth year], 64 y.o.   MRN: [id number]  HPI 05/29/11- 64 yo morbidly obese, followed for OSA, pulmonary hypertension, complicated by DM, thyroid disease, AFib/pacemaker Was hosp with CHF/ AFIB in December- Dr Aldona Bar. Improved temporarily, finding  Mitral Regurgitation. Had cath- clean coronaries  and TEE, then PFT and  CT at [facility]. Pulmonary Hypertension. Amiodarone was increased and she converted to NSR. Pacemaker has been replaced.  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 She questions "cardiac asthma". Dyspnea/ chest tightness responds to rescue inhaler, but little wheeze or cough. Today humid weather makes her more short of breath. Continues Spiriva When more restless at night it corresponds to note of more fluid retention next day. We discussed restless sleep as suggesting hypoxia. Uses CPAP all night every night at 9.   09/07/11-  35 yoF morbidly obese, followed for OSA, pulmonary hypertension, complicated by DM, thyroid disease, AFib/pacemaker, hx MGUS She describes problem with bruising 2 weeks ago. She saw Dr Rex Kras and Dr Renold Genta-  Given Rocephin and doxycycline empirically. She is better now, but remains tireder than usual. She did not recognize an obvious infection otherwise. ONOX 08/31/11- </= 88% for 133 minutes. Reviewed with her and agreed she meets indications to add O2 for her CPAP, on the basis of obesity hypoventilation syndrome.  Breathing is comfortable now at rest and labored with exertion. She still tries to do water aerobics.  She wants to wait on flu vax till she finishes this round of doxycycline.   07/22/12-  69 yoF morbidly obese, followed for OSA, pulmonary hypertension, complicated by DM, thyroid disease, AFib/pacemaker, hx MGUS Wear  CPAP 9/ Advanced for 7 days week approx 8 hours night. on 2 L of O2 at night At recent cardiology followup with Dr. Arelia Longest 07/15/2012 moderate to severe mitral  regurgitation, RV mildly dilated with pacemaker lead,  LV severely dilated. EF 40-45%, PAP 55, severe TR, RVSP 50-60 Hospitalized in the spring with uterine bleed, abnormal GYN cytology being treated with Mirena IUD. Retaining fluid she blames on hormones. Continues doing water exercises at gym.  04/03/13- 62 yoF never smoker- morbidly obese, followed for OSA, pulmonary hypertension, complicated by DM, thyroid disease, AFib/pacemaker, hx MGUS CPAP 9/ Advanced w/ O2 2L for sleep. Follow up.  Wearing cpap with 2lpm o2 everynight for approx 8 hours.  No problems with mask or pressure. Doing better since hospital cardioversion for atrial fibrillation and increased dose of amiodarone. Much stress at home. CXR 11/04/12 IMPRESSION:  No acute intrathoracic abnormality.  Original Report Authenticated By: Inez Catalina, M.D.  04/09/14-62 yoF never smoker- morbidly obese, followed for OSA, pulmonary hypertension, complicated by DM, thyroid disease, AFib/pacemaker, hx MGUS FOLLOWS FOR: wears CPAP 9/ Advanced O2 2L every night for about 14 hours and during naps as well; pressure works well for patient.  Pt can not tell that Spiriva is working anymore; will need refills (printed) for Albuterol rescue inhaler and albuterol neb tx. Recurrent heart failure/atrial fibrillation/multiple cardioversion attempts, being diuresed. Poor balance, and now uses walker.  Review of Systems- see HPI Constitutional:   No weight loss, night sweats,  Fevers, chills, fatigue, lassitude. HEENT:   No headaches,  Difficulty swallowing,  Tooth/dental problems,  Sore throat,                No sneezing, itching, ear ache, nasal congestion, post nasal drip,  CV:  No acute chest pain, orthopnea, PND,  anasarca, dizziness, palpitations. +  peripheral edema GI  No heartburn, indigestion, abdominal pain, nausea, vomiting,  Resp: No excess mucus, no productive cough,  No non-productive cough,  No coughing up of blood.  No change in color of  mucus.  No wheezing.   Skin: no rash or lesions. GU: . MS:  No joint pain or swelling.  Marland Kitchen Psych:  No change in mood or affect. No expressed depression or anxiety.  No memory loss.  Objective:   Physical Exam General- Alert, Oriented, Affect-appropriate, Distress- none acute  Massively obese. O2 sat at                   rest RA 95% Skin- rash-none, lesions- none, excoriation- none Lymphadenopathy- none Head- atraumatic            Eyes- Gross vision intact, PERRLA, conjunctivae clear secretions            Ears- Hearing, canals normal            Nose- Clear, no-Septal dev, mucus, polyps, erosion, perforation             Throat- Mallampati II , mucosa clear , drainage- none, tonsils- atrophic Neck- flexible , trachea midline, no stridor , thyroid nl, carotid no bruit Chest - symmetrical excursion , unlabored           Heart/CV- RRR/ paced , no murmur , no gallop  , no rub, nl s1 s2                           - JVD+ , edema- 3-4+ chronic edema, +stasis changes, varices- none           Lung- +few squeaks, but shallow consistent with her body habitus, wheeze- none,                              cough-none , dullness-none, rub- none           Chest wall- +pacemaker right chest Abd-  Br/ Gen/ Rectal- Not done, not indicated Extrem- cyanosis- none, clubbing, none, atrophy- none, strength- nl Neuro- grossly intact to observation Assessment & Plan:

## 2014-04-09 NOTE — Patient Instructions (Signed)
Samples given x 2 Flovent 250 diskus    Try 1 puff then rinse well, twice daily maintenance  Stop Spiriva for now. You can always start it back at any time.

## 2014-04-12 ENCOUNTER — Encounter (HOSPITAL_BASED_OUTPATIENT_CLINIC_OR_DEPARTMENT_OTHER): Payer: Medicare Other | Attending: Plastic Surgery

## 2014-04-12 DIAGNOSIS — L97809 Non-pressure chronic ulcer of other part of unspecified lower leg with unspecified severity: Secondary | ICD-10-CM | POA: Insufficient documentation

## 2014-04-12 DIAGNOSIS — I890 Lymphedema, not elsewhere classified: Secondary | ICD-10-CM | POA: Insufficient documentation

## 2014-04-13 NOTE — Progress Notes (Signed)
[facility]  NAME:  [name], [name]             ACCOUNT NO.:  [id number]  MEDICAL RECORD NO.:  [id number]      DATE OF BIRTH:  Oct 18, 1950  PHYSICIAN:  Irene Limbo, MD    VISIT DATE:  04/12/2014                                  OFFICE VISIT   CHIEF COMPLAINT:  Bilateral lower extremity ulcerations secondary to lymphedema.  HISTORY OF PRESENT ILLNESS:  The patient is a 64 year old ambulatory female with history of morbid obesity, heart failure, and lymphedema that presents for followup of recurrent ulcerations over bilateral lower extremities.  The patient has declined all compression therapy and we have moved her to palliative care.  She reports since her last visit here, she went into heart failure with rapid heart beat and took Cardizem for treatment of this.  She then developed increased blistering over her lower extremities and the Cardizem was discontinued.  During this period, she also had approximately 10- to 12-pound weight change secondary to fluids and was started on a new diuretic which is slowly being tapered off.  It is unclear whether the new blistering was due to increased edema of her legs or secondary to medication.  PHYSICAL EXAMINATION:  VITAL SIGNS:  Blood pressure is 137/77, pulse is 92, temperature is 97.7, blood glucose is 89.  All of her wounds appeared clean today.  No debridement was performed. Left calf measures 65.8 cm, the left ankle is 37 cm, right calf is 63 cm, right ankle is 35.8 cm.  The patient has multiple wounds over bilateral lower extremities.  The cluster over the right lower extremity is measured at 10 x 23.5 x 0.1 cm.  Over the left lower extremity, the wound is measured as 20 x 40 x 0.1 cm as a cluster.  ASSESSMENT:  Continue with collagen and Telfa with light paper tape. The patient does report that she used Ace wraps during her episode of heart failure to try to control the edema in her legs.  She is not  using this currently.  She states she did order TED hose for use.          ______________________________ Irene Limbo, MD MBA     BT/MEDQ  D:  04/12/2014  T:  04/13/2014  Job:  [id number]

## 2014-05-03 ENCOUNTER — Encounter (HOSPITAL_BASED_OUTPATIENT_CLINIC_OR_DEPARTMENT_OTHER): Payer: Medicare Other | Attending: Plastic Surgery

## 2014-05-03 DIAGNOSIS — L97209 Non-pressure chronic ulcer of unspecified calf with unspecified severity: Secondary | ICD-10-CM | POA: Insufficient documentation

## 2014-05-03 DIAGNOSIS — I890 Lymphedema, not elsewhere classified: Secondary | ICD-10-CM | POA: Insufficient documentation

## 2014-05-03 DIAGNOSIS — L97309 Non-pressure chronic ulcer of unspecified ankle with unspecified severity: Secondary | ICD-10-CM | POA: Insufficient documentation

## 2014-05-03 DIAGNOSIS — I509 Heart failure, unspecified: Secondary | ICD-10-CM | POA: Insufficient documentation

## 2014-05-03 DIAGNOSIS — M109 Gout, unspecified: Secondary | ICD-10-CM | POA: Insufficient documentation

## 2014-05-04 NOTE — Progress Notes (Signed)
[facility]  NAME:  [name], [name]             ACCOUNT NO.:  [id number]  MEDICAL RECORD NO.:  [id number]      DATE OF BIRTH:  1950/04/14  PHYSICIAN:  Irene Limbo, MD    VISIT DATE:  05/03/2014                                  OFFICE VISIT   CHIEF COMPLAINT:  Bilateral lower extremity ulcerations in the setting of lymphedema and morbid obesity.  HISTORY OF PRESENT ILLNESS:  The patient is a 64 year old female who is ambulatory with bilateral lower extremity ulcerations.  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  The patient has been using her own compression bought from [facility]. She is not wearing them on presentation today as she states she removed them in anticipation of her visit.  It is unclear how much compression she is receiving from these.  She has never used a lymphedema pump.  She has declined previous layered compression wrap or unna boots.  Her current wound care has been collagen followed by Telfa.  She reports since her last visit that she had to hold all of her diuretics secondary to the above diagnosis of gout and she is set to restart on this tomorrow.  PHYSICAL EXAMINATION:  Blood pressure is 104/68, pulse 71, blood glucose is 98, temperature is 97.8.  Right lower extremity has multiple superficial areas of epidermolysis consistent with lymphedema and venous stasis.  Total wound size measured as a cluster is measured at 13 x 27 x 0.1.  Over the left lower extremity again multiple areas of waxing and waning, healing and epidermolysis measured as a cluster measured as 20 x 10 x 0.1 cm.  Right calf measures 65 cm, right ankle is 35 cm, left calf is 68.5 cm, left ankle is 37 cm.  No debridement performed today.  We will continue with the collagen.  I asked her to bring her compression stockings for examination at her next visit,  and we will order her a lymphedema pump.  We discussed that in order for this to be effective she will need to use it throughout the day, and that this does provide a significant amount of compression which she has not been able to tolerate in the past.  The patient does want to try this.          ______________________________ Irene Limbo, MD MBA     BT/MEDQ  D:  05/03/2014  T:  05/04/2014  Job:  [id number]

## 2014-05-09 ENCOUNTER — Encounter: Payer: Self-pay | Admitting: Internal Medicine

## 2014-05-09 NOTE — Assessment & Plan Note (Signed)
She does not see benefit from Spiriva but does from bronchodilators, limited by cardiac urine ability. Plan-try replacing Spiriva with a steroid inhaler-Flovent diskus sample

## 2014-05-09 NOTE — Assessment & Plan Note (Signed)
Continues oxygen through her CPAP

## 2014-05-09 NOTE — Assessment & Plan Note (Signed)
She is not able to make this change

## 2014-05-09 NOTE — Assessment & Plan Note (Signed)
Good compliance and control 

## 2014-05-17 ENCOUNTER — Other Ambulatory Visit: Payer: Self-pay | Admitting: "Endocrinology

## 2014-05-25 ENCOUNTER — Other Ambulatory Visit: Payer: Self-pay | Admitting: *Deleted

## 2014-05-25 ENCOUNTER — Ambulatory Visit (INDEPENDENT_AMBULATORY_CARE_PROVIDER_SITE_OTHER): Payer: Medicare Other | Admitting: Cardiovascular Disease

## 2014-05-25 ENCOUNTER — Encounter: Payer: Self-pay | Admitting: Cardiovascular Disease

## 2014-05-25 ENCOUNTER — Ambulatory Visit (INDEPENDENT_AMBULATORY_CARE_PROVIDER_SITE_OTHER): Payer: Medicare Other | Admitting: *Deleted

## 2014-05-25 VITALS — BP 110/64 | HR 72 | Resp 20 | Ht 67.0 in | Wt 329.0 lb

## 2014-05-25 DIAGNOSIS — D689 Coagulation defect, unspecified: Secondary | ICD-10-CM

## 2014-05-25 DIAGNOSIS — I5042 Chronic combined systolic (congestive) and diastolic (congestive) heart failure: Secondary | ICD-10-CM

## 2014-05-25 DIAGNOSIS — I480 Paroxysmal atrial fibrillation: Secondary | ICD-10-CM

## 2014-05-25 DIAGNOSIS — I10 Essential (primary) hypertension: Secondary | ICD-10-CM

## 2014-05-25 DIAGNOSIS — N184 Chronic kidney disease, stage 4 (severe): Secondary | ICD-10-CM

## 2014-05-25 DIAGNOSIS — I4891 Unspecified atrial fibrillation: Secondary | ICD-10-CM

## 2014-05-25 DIAGNOSIS — R5383 Other fatigue: Secondary | ICD-10-CM

## 2014-05-25 DIAGNOSIS — I059 Rheumatic mitral valve disease, unspecified: Secondary | ICD-10-CM

## 2014-05-25 DIAGNOSIS — Z01818 Encounter for other preprocedural examination: Secondary | ICD-10-CM

## 2014-05-25 DIAGNOSIS — I2789 Other specified pulmonary heart diseases: Secondary | ICD-10-CM

## 2014-05-25 DIAGNOSIS — R5381 Other malaise: Secondary | ICD-10-CM

## 2014-05-25 DIAGNOSIS — I340 Nonrheumatic mitral (valve) insufficiency: Secondary | ICD-10-CM | POA: Insufficient documentation

## 2014-05-25 DIAGNOSIS — I428 Other cardiomyopathies: Secondary | ICD-10-CM

## 2014-05-25 DIAGNOSIS — Z7901 Long term (current) use of anticoagulants: Secondary | ICD-10-CM

## 2014-05-25 LAB — PACEMAKER DEVICE OBSERVATION

## 2014-05-25 LAB — POCT INR: INR: 2.5

## 2014-05-25 NOTE — Patient Instructions (Addendum)
[facility]  [phone]  -   Contact for prices on your medications.  Dr. Sallyanne Kuster would like to schedule a cardioversion for next week.  You will need lab work 3-5 days prior to your procedure date.  You do not need to be fasting for the blood work.   Please increase AMIODARONE to 600mg  daily.

## 2014-05-25 NOTE — Progress Notes (Signed)
Patient ID: [name], female   DOB: 1950-12-26, 64 y.o.   MRN: [id number]      Reason for office visit Atrial fibrillation (recurrent post cardioversion), chronic systolic heart failure, severe mitral insufficiency, complete heart block status post pacemaker  [name] feels extremely weak and continues to have severe lower extremity edema. The weeping blisters on her legs are slowly improving but she still has occasional fluid leakage. She complains of her mouth feeling dry all the time. She developed widespread tophaceous gout do to treatment with a combination of loop diuretics and metolazone. Her uric acid was up to 16.4. She is now taking Uloric and colchicine. She reports that her last BUN was 90 and the creatinine 1.6, both markedly improved from the recent past.  We have previously estimated her "dry weight" to be around 330 pounds, and that is pretty much what she weighs today.  Pacemaker interrogation shows that she went back into atrial fibrillation roughly 2 months after her successful cardioversion on February 2nd. Notwithstanding some "under sensing" she seems to be in persistent atrial fibrillation since April 9.  [name] has moderately severe nonischemic cardiomyopathy with a left ventricular ejection fraction of 30-35%, severe mitral insufficiency and severe tricuspid insufficiency and moderate to severe pulmonary hypertension. Because of her severe obesity and multiple comorbid conditions she is not a good candidate for surgical valve repair. She is pacemaker dependent, due to complete heart block but also paces the atrium 100% of the time when not in atrial fibrillation, primarily due to treatment with relatively high doses of amiodarone.  She also has a single kidney and at least stage III chronic kidney disease and has had very volatile renal function parameters over the last couple of years. In the past has told me that under no circumstances will she her for consider  Hemodialysis. She now seems to have a more ambiguous position about hemodialysis: "I can stop it if it doesn't work for me". She sees Dr. Lorrene Reid in the nephrology clinic.  Allergies  Allergen Reactions  . Ivp Dye [Iodinated Diagnostic Agents] Shortness Of Breath    Turn red, can't breathe  . Betadine [Povidone Iodine]   . Diphenhydramine Hcl Swelling    In hands and eyes  . Fish Allergy Nausea And Vomiting  . Iohexol      Desc: PT TURNS RED AND WHEEZING   . Penicillins Other (See Comments)    Resp arrest as child  . Povidone-Iodine Other (See Comments)    Wheezing, turn red, can't breath, and blisters  . Promethazine Hcl Other (See Comments)    Low Blood Pressure  . Tape     Blisters, can use paper tape for short periods  . Clindamycin Hives and Rash    wheezing  . Iodine Rash  . Morphine Sulfate Nausea And Vomiting and Rash    Current Outpatient Prescriptions  Medication Sig Dispense Refill  . albuterol (PROVENTIL HFA;VENTOLIN HFA) 108 (90 BASE) MCG/ACT inhaler Inhale 2 puffs into the lungs every 6 (six) hours as needed. For shortness of breath      . albuterol (PROVENTIL) (2.5 MG/3ML) 0.083% nebulizer solution Take 2.5 mg by nebulization every 6 (six) hours as needed. For shortness of breath      . ALPRAZolam (XANAX) 0.25 MG tablet Take 1 tablet (0.25 mg total) by mouth at bedtime as needed for anxiety.  30 tablet  3  . amiodarone (PACERONE) 200 MG tablet Take 1 tablet (200 mg total) by mouth 2 (two) times  daily.  180 tablet  6  . calcitRIOL (ROCALTROL) 0.25 MCG capsule TAKE (1) CAPSULE TWICE DAILY.  60 capsule  6  . calcium citrate-vitamin D 200-200 MG-UNIT TABS Take 1 tablet by mouth 4 (four) times daily -  before meals and at bedtime.      . colchicine 0.6 MG tablet Take 0.6 mg by mouth 2 (two) times daily.      Marland Kitchen COUMADIN 5 MG tablet Take 1 tablet by mouth daily or as directed  90 tablet  1  . digoxin (LANOXIN) 0.125 MG tablet Take 0.5 tablets (0.0625 mg total) by mouth  daily.  15 tablet  10  . glucose blood (ONE TOUCH ULTRA TEST) test strip Test 3 times daily  100 each  12  . glyBURIDE (DIABETA) 5 MG tablet Take 1-2 tablets (5-10 mg total) by mouth 2 (two) times daily with a meal. Take 10mg  in the morning and 5mg  at night  120 tablet  4  . HYDROcodone-acetaminophen (NORCO) 10-325 MG per tablet Take 1 tablet by mouth 2 (two) times daily as needed for pain.  90 tablet  5  . insulin glargine (LANTUS) 100 UNIT/ML injection Use up to 27 units daily      . Insulin Pen Needle (B-D UF III MINI PEN NEEDLES) 31G X 5 MM MISC Check blood sugar up to 6x daily  200 each  3  . iron polysaccharides (NIFEREX) 150 MG capsule Take 150 mg by mouth 2 (two) times daily.      Marland Kitchen JANUVIA 100 MG tablet TAKE 1 TABLET DAILY.  30 tablet  4  . levothyroxine (SYNTHROID, LEVOTHROID) 125 MCG tablet Take 125 mcg by mouth daily before breakfast.      . metolazone (ZAROXOLYN) 5 MG tablet Take 2.5 mg by mouth daily as needed. If up 4-5 lbs.      . metoprolol tartrate (LOPRESSOR) 25 MG tablet Take 1.5 tablets (37.5 mg total) by mouth 2 (two) times daily.  90 tablet  11  . Multiple Vitamin (MULTIVITAMIN) tablet Take 1 tablet by mouth daily.       . nitroGLYCERIN (NITRODUR - DOSED IN MG/24 HR) 0.4 mg/hr patch Place 1 patch (0.4 mg total) onto the skin every other day.  30 patch  6  . nitroGLYCERIN (NITROSTAT) 0.4 MG SL tablet Place 0.4 mg under the tongue every 5 (five) minutes as needed. For chest pain      . potassium chloride SA (K-DUR,KLOR-CON) 20 MEQ tablet Take 1 tablet (20 mEq total) by mouth 4 (four) times daily.  120 tablet  11  . torsemide (DEMADEX) 100 MG tablet Take 50 mg by mouth 2 (two) times daily.       Marland Kitchen ULORIC 40 MG tablet Take 40 mg by mouth daily.      . Vitamin D, Ergocalciferol, (DRISDOL) 50000 UNITS CAPS capsule Takes on tuesdays and fridays  30 capsule  12  . VOLTAREN 1 % GEL 4 (four) times daily as needed.       No current facility-administered medications for this visit.     Past Medical History  Diagnosis Date  . Personal history of other diseases of circulatory system   . 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 Chronic lymphocytic thyroiditis   . Morbid obesity   . Other chronic pulmonary heart diseases   . Obstructive sleep apnea (adult) (pediatric)   . Other and unspecified hyperlipidemia   . Fibromyalgia   . Goiter   .  Thyroiditis, autoimmune   . Hypothyroidism, acquired, autoimmune   . Solitary kidney, acquired   . Fatigue   . Hyperparathyroidism, secondary   . Vitamin D deficiency disease   . Hyperparathyroidism   . H/O varicose veins 03/27/12  . Type II or unspecified type diabetes mellitus without mention of complication, not stated as uncontrolled   . Diabetes mellitus type II   . Infections of kidney 03/27/12  . History of measles, mumps, or rubella 03/27/12  . Tubal pregnancy 12/23/76  . Unspecified essential hypertension     20 yrs ago  . Blood transfusion 1977    [facility]  . Complication of anesthesia 2010    hard to wake up after knee surgery  . Anemia   . Cardiomyopathy, nonischemic     EF 45 %  . Endometrial hyperplasia with atypia 05/05/12  . Diastolic HF (heart failure) 11/05/2012  . A-fib   . Echocardiogram abnormal 07/15/12    severe MR,mod to severe TR,mod to severe PA hypertension  . Pulmonary arterial hypertension   . PAF (paroxysmal atrial fibrillation), 1st episode since DCCV in Jan/14 03/19/2013  . Ulcers of both lower extremities, small, now with cellulitis 06/05/2013    Past Surgical History  Procedure Laterality Date  . Cholecystectomy    . Pacemaker placement  02/26/11    Medtronic Adapta  . Knee surgery    . Resection duplicate left kidney    . 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 Hernia repair  1999    ruptured ;  . D& c with hysteroscopy & cervical polypectomy  05/05/12  . Cardioversion N/A 02/06/2013    Successful  . Iud removal  01/30/13  . Ectopic pregnancy surgery  1978   . Cardioversion N/A 06/08/2013    Procedure: CARDIOVERSION;  Surgeon: Sanda Klein, MD;  Location: [facility];  Service: Cardiovascular;  Laterality: N/A;  Room [phone]  . Cardioversion N/A 02/01/2014    Procedure: CARDIOVERSION;  Surgeon: Sanda Klein, MD;  Location: [facility];  Service: Cardiovascular;  Laterality: N/A;    Family History  Problem Relation Age of Onset  . Cardiomyopathy Brother   . Diabetes Mother   . Cancer Maternal Grandmother   . Heart attack Mother     History   Social History  . Marital Status: Married    Spouse Name: N/A    Number of Children: N/A  . Years of Education: N/A   Occupational History  . retired Building services engineer    Social History Main Topics  . Smoking status: Never Smoker   . Smokeless tobacco: Never Used  . Alcohol Use: No  . Drug Use: No  . Sexual Activity: Yes    Partners: Male    Birth Control/ Protection: Post-menopausal   Other Topics Concern  . Not on file   Social History Narrative  . No narrative on file    Review of systems: Severe dyspnea with minimal exertion, possible mild orthopnea, severe lower extremity edema with active weeping and multiple skin wounds, no pathological bleeding, no chest pain, no syncope or palpitations, no fever or chills, no change in bowel pattern, no abdominal pain, recent nausea and vomiting that has resolved, no urinary complaints, no vaginal bleeding, no hot flashes, no new neurological complaints   PHYSICAL EXAM BP 110/64  Pulse 72  Resp 20  Ht 5\' 7"  (1.702 m)  Wt 329 lb (149.233 kg)  BMI 51.52 kg/m2 General appearance: alert, cooperative, no distress and morbidly obese  Jugular  venous pulsations do not appear to be severely elevated, although she has prominent V waves. Neck: no adenopathy, no carotid bruit, supple, symmetrical, trachea midline, thyroid not enlarged, symmetric, no tenderness/mass/nodules and It difficult to see jugular venous pulsations due to obesity  Lungs: clear to  auscultation bilaterally  Heart: regular rate and rhythm, S1: split, S2: Paradoxical splitting, S3 present and systolic murmur: holosystolic 2/6, blowing at apex  Abdomen: soft, non-tender; bowel sounds normal; no masses, no organomegaly  Extremities: edema 3+ symmetrically bilaterally and Multiple 1-5 cm ulcers with active weeping and mild bleeding bilaterally, blisters with overlying devitalized skin  She has developed several gouty tophi most prominently on the right second distal interphalangeal joint and third proximal interphalangeal joint Pulses: 2+ and symmetric  Skin: Ulcers as described above, rounding erythema is worsening and very concerning for cellulitis  Neurologic: Alert and oriented X 3, normal strength and tone. Normal symmetric reflexes. Normal coordination and gait    EKG: Atrial fibrillation, 100% ventricular paced, QRS duration 180 ms, QTC 538 ms  Lipid Panel     Component Value Date/Time   CHOL 149 11/30/2013 0942   TRIG 137 11/30/2013 0942   HDL 33* 11/30/2013 0942   CHOLHDL 4.5 11/30/2013 0942   VLDL 27 11/30/2013 0942   LDLCALC 89 11/30/2013 0942    BMET    Component Value Date/Time   NA 134* 03/30/2014 1720   K 3.6 03/30/2014 1720   CL 89* 03/30/2014 1720   CO2 35* 03/30/2014 1720   GLUCOSE 178* 03/30/2014 1720   BUN 116* 03/30/2014 1720   CREATININE 2.37* 03/30/2014 1720   CREATININE 2.19* 09/21/2013 1002   CREATININE 1.28* 02/12/2011 1000   CALCIUM 9.0 03/30/2014 1720   CALCIUM 9.5 03/24/2012 1212   GFRNONAA 22* 09/21/2013 1002   GFRAA 26* 09/21/2013 1002     ASSESSMENT AND PLAN  Despite being as close to a euvolemic state as we can get her (she still has very severe lower extremity edema), [name] feels poorly. She has a prolonged acute exacerbation of chronic heart failure, with predominantly right heart failure findings As before, she seems to deteriorate and she is in atrial fibrillation. Attempts at long-term maintenance of normal sinus rhythm are likely  doomed to failure because of the severity of her mitral insufficiency and left atrial enlargement, but she does experience significant symptomatic improvement every time she has a cardioversion. We'll schedule her for another cardioversion next week. She has been therapeutically anticoagulated. Will increase the amiodarone to a total of 600 mg daily. I have always been reluctant to refer her for invasive procedures since that there is a high risk of infection, but we'll have to consider referral for cardiac resynchronization pacing. She really has a very broad QRS complex.  Orders Placed This Encounter  Procedures  . Cardioversion external  . TSH  . T4, free  . CBC  . Comprehensive metabolic panel  . Protime-INR  . APTT  . EKG 12-Lead   Meds ordered this encounter  Medications  . colchicine 0.6 MG tablet    Sig: Take 0.6 mg by mouth 2 (two) times daily.  Marland Kitchen ULORIC 40 MG tablet    Sig: Take 40 mg by mouth daily.  . metolazone (ZAROXOLYN) 5 MG tablet    Sig: Take 2.5 mg by mouth daily as needed. If up 4-5 lbs.    Dmiya Malphrus  Sanda Klein, MD, [facility] [phone] office [phone] pager

## 2014-05-26 ENCOUNTER — Encounter (HOSPITAL_COMMUNITY): Payer: Self-pay | Admitting: Pharmacy Technician

## 2014-05-27 LAB — MDC_IDC_ENUM_SESS_TYPE_INCLINIC
Brady Statistic AP VS Percent: 0.2 %
Brady Statistic AS VS Percent: 0.8 %
Lead Channel Impedance Value: 316 Ohm
Lead Channel Impedance Value: 349 Ohm
Lead Channel Pacing Threshold Amplitude: 1.375 V
Lead Channel Sensing Intrinsic Amplitude: 0.18 mV
Lead Channel Setting Pacing Amplitude: 2.75 V
Lead Channel Setting Pacing Pulse Width: 0.4 ms
MDC IDC MSMT BATTERY VOLTAGE: 2.78 V
MDC IDC MSMT LEADCHNL RV PACING THRESHOLD PULSEWIDTH: 0.4 ms
MDC IDC SET LEADCHNL RA PACING AMPLITUDE: 3 V
MDC IDC SET LEADCHNL RV SENSING SENSITIVITY: 2 mV
MDC IDC STAT BRADY AP VP PERCENT: 77.8 %
MDC IDC STAT BRADY AS VP PERCENT: 21.2 %

## 2014-05-28 LAB — CBC
HCT: 31.6 % — ABNORMAL LOW (ref 36.0–46.0)
Hemoglobin: 10.4 g/dL — ABNORMAL LOW (ref 12.0–15.0)
MCH: 30.6 pg (ref 26.0–34.0)
MCHC: 32.9 g/dL (ref 30.0–36.0)
MCV: 92.9 fL (ref 78.0–100.0)
PLATELETS: 254 10*3/uL (ref 150–400)
RBC: 3.4 MIL/uL — AB (ref 3.87–5.11)
RDW: 16.5 % — AB (ref 11.5–15.5)
WBC: 7 10*3/uL (ref 4.0–10.5)

## 2014-05-29 LAB — COMPREHENSIVE METABOLIC PANEL
ALT: 49 U/L — AB (ref 0–35)
AST: 37 U/L (ref 0–37)
Albumin: 2.8 g/dL — ABNORMAL LOW (ref 3.5–5.2)
Alkaline Phosphatase: 136 U/L — ABNORMAL HIGH (ref 39–117)
BUN: 79 mg/dL — ABNORMAL HIGH (ref 6–23)
CALCIUM: 8.7 mg/dL (ref 8.4–10.5)
CHLORIDE: 91 meq/L — AB (ref 96–112)
CO2: 34 meq/L — AB (ref 19–32)
CREATININE: 2.1 mg/dL — AB (ref 0.50–1.10)
Glucose, Bld: 152 mg/dL — ABNORMAL HIGH (ref 70–99)
Potassium: 3.6 mEq/L (ref 3.5–5.3)
SODIUM: 135 meq/L (ref 135–145)
TOTAL PROTEIN: 7.1 g/dL (ref 6.0–8.3)
Total Bilirubin: 1.6 mg/dL — ABNORMAL HIGH (ref 0.2–1.2)

## 2014-05-29 LAB — PROTIME-INR
INR: 2.15 — ABNORMAL HIGH (ref <1.50)
PROTHROMBIN TIME: 23.5 s — AB (ref 11.6–15.2)

## 2014-05-29 LAB — TSH: TSH: 2.121 u[IU]/mL (ref 0.350–4.500)

## 2014-05-29 LAB — APTT: aPTT: 48 seconds — ABNORMAL HIGH (ref 24–37)

## 2014-05-29 LAB — T4, FREE: FREE T4: 1.91 ng/dL — AB (ref 0.80–1.80)

## 2014-05-31 ENCOUNTER — Encounter (HOSPITAL_BASED_OUTPATIENT_CLINIC_OR_DEPARTMENT_OTHER): Payer: Medicare Other | Attending: Plastic Surgery

## 2014-05-31 ENCOUNTER — Encounter: Payer: Self-pay | Admitting: *Deleted

## 2014-05-31 DIAGNOSIS — I890 Lymphedema, not elsewhere classified: Secondary | ICD-10-CM | POA: Insufficient documentation

## 2014-05-31 DIAGNOSIS — L97809 Non-pressure chronic ulcer of other part of unspecified lower leg with unspecified severity: Secondary | ICD-10-CM | POA: Insufficient documentation

## 2014-06-02 ENCOUNTER — Encounter (HOSPITAL_COMMUNITY): Payer: Self-pay | Admitting: *Deleted

## 2014-06-02 ENCOUNTER — Ambulatory Visit (HOSPITAL_COMMUNITY): Payer: Medicare Other | Admitting: Certified Registered Nurse Anesthetist

## 2014-06-02 ENCOUNTER — Encounter (HOSPITAL_COMMUNITY)
Admission: RE | Disposition: A | Discharge status: Home / Self Care | Payer: Self-pay | Source: Ambulatory Visit | Attending: Cardiovascular Disease

## 2014-06-02 ENCOUNTER — Ambulatory Visit (HOSPITAL_COMMUNITY)
Admission: RE | Admit: 2014-06-02 | Discharge: 2014-06-02 | Disposition: A | Discharge status: Home / Self Care | Payer: Medicare Other | Source: Ambulatory Visit | Attending: Cardiovascular Disease | Admitting: Cardiovascular Disease

## 2014-06-02 ENCOUNTER — Telehealth: Payer: Self-pay | Admitting: Cardiovascular Disease

## 2014-06-02 ENCOUNTER — Encounter (HOSPITAL_COMMUNITY): Payer: Medicare Other | Admitting: Certified Registered Nurse Anesthetist

## 2014-06-02 DIAGNOSIS — Z7901 Long term (current) use of anticoagulants: Secondary | ICD-10-CM | POA: Insufficient documentation

## 2014-06-02 DIAGNOSIS — IMO0001 Reserved for inherently not codable concepts without codable children: Secondary | ICD-10-CM | POA: Insufficient documentation

## 2014-06-02 DIAGNOSIS — I5022 Chronic systolic (congestive) heart failure: Secondary | ICD-10-CM | POA: Insufficient documentation

## 2014-06-02 DIAGNOSIS — N009 Acute nephritic syndrome with unspecified morphologic changes: Secondary | ICD-10-CM | POA: Insufficient documentation

## 2014-06-02 DIAGNOSIS — E039 Hypothyroidism, unspecified: Secondary | ICD-10-CM | POA: Insufficient documentation

## 2014-06-02 DIAGNOSIS — Z79899 Other long term (current) drug therapy: Secondary | ICD-10-CM | POA: Insufficient documentation

## 2014-06-02 DIAGNOSIS — Z6841 Body Mass Index (BMI) 40.0 and over, adult: Secondary | ICD-10-CM | POA: Insufficient documentation

## 2014-06-02 DIAGNOSIS — I509 Heart failure, unspecified: Secondary | ICD-10-CM | POA: Insufficient documentation

## 2014-06-02 DIAGNOSIS — Z881 Allergy status to other antibiotic agents status: Secondary | ICD-10-CM | POA: Insufficient documentation

## 2014-06-02 DIAGNOSIS — I059 Rheumatic mitral valve disease, unspecified: Secondary | ICD-10-CM | POA: Insufficient documentation

## 2014-06-02 DIAGNOSIS — Z91041 Radiographic dye allergy status: Secondary | ICD-10-CM | POA: Insufficient documentation

## 2014-06-02 DIAGNOSIS — E559 Vitamin D deficiency, unspecified: Secondary | ICD-10-CM | POA: Insufficient documentation

## 2014-06-02 DIAGNOSIS — Z885 Allergy status to narcotic agent status: Secondary | ICD-10-CM | POA: Insufficient documentation

## 2014-06-02 DIAGNOSIS — E785 Hyperlipidemia, unspecified: Secondary | ICD-10-CM | POA: Insufficient documentation

## 2014-06-02 DIAGNOSIS — Z794 Long term (current) use of insulin: Secondary | ICD-10-CM | POA: Insufficient documentation

## 2014-06-02 DIAGNOSIS — I4891 Unspecified atrial fibrillation: Secondary | ICD-10-CM

## 2014-06-02 DIAGNOSIS — Z888 Allergy status to other drugs, medicaments and biological substances status: Secondary | ICD-10-CM | POA: Insufficient documentation

## 2014-06-02 DIAGNOSIS — E119 Type 2 diabetes mellitus without complications: Secondary | ICD-10-CM | POA: Insufficient documentation

## 2014-06-02 DIAGNOSIS — I2789 Other specified pulmonary heart diseases: Secondary | ICD-10-CM | POA: Insufficient documentation

## 2014-06-02 DIAGNOSIS — Z880 Allergy status to penicillin: Secondary | ICD-10-CM | POA: Insufficient documentation

## 2014-06-02 DIAGNOSIS — I442 Atrioventricular block, complete: Secondary | ICD-10-CM | POA: Insufficient documentation

## 2014-06-02 DIAGNOSIS — I079 Rheumatic tricuspid valve disease, unspecified: Secondary | ICD-10-CM | POA: Insufficient documentation

## 2014-06-02 DIAGNOSIS — G4733 Obstructive sleep apnea (adult) (pediatric): Secondary | ICD-10-CM | POA: Insufficient documentation

## 2014-06-02 DIAGNOSIS — D649 Anemia, unspecified: Secondary | ICD-10-CM | POA: Insufficient documentation

## 2014-06-02 DIAGNOSIS — Z905 Acquired absence of kidney: Secondary | ICD-10-CM | POA: Insufficient documentation

## 2014-06-02 DIAGNOSIS — Z950 Presence of cardiac pacemaker: Secondary | ICD-10-CM | POA: Insufficient documentation

## 2014-06-02 DIAGNOSIS — Z91013 Allergy to seafood: Secondary | ICD-10-CM | POA: Insufficient documentation

## 2014-06-02 HISTORY — PX: CARDIOVERSION: SHX1299

## 2014-06-02 LAB — GLUCOSE, CAPILLARY: GLUCOSE-CAPILLARY: 115 mg/dL — AB (ref 70–99)

## 2014-06-02 SURGERY — CARDIOVERSION
Anesthesia: General

## 2014-06-02 MED ORDER — PROPOFOL 10 MG/ML IV BOLUS
INTRAVENOUS | Status: DC | PRN
  Administered 2014-06-02: 50 mg via INTRAVENOUS
  Administered 2014-06-02: 30 mg via INTRAVENOUS

## 2014-06-02 MED ORDER — LEVOTHYROXINE SODIUM 125 MCG PO TABS: 125.0000 ug | ORAL_TABLET | Freq: Every day | ORAL | Status: AC

## 2014-06-02 MED ORDER — LIDOCAINE HCL (CARDIAC) 20 MG/ML IV SOLN
INTRAVENOUS | Status: DC | PRN
  Administered 2014-06-02: 60 mg via INTRAVENOUS

## 2014-06-02 MED ORDER — SODIUM CHLORIDE 0.9 % IV SOLN: INTRAVENOUS | Status: DC

## 2014-06-02 MED ORDER — LEVOTHYROXINE SODIUM 100 MCG PO TABS: 100.0000 ug | ORAL_TABLET | Freq: Every day | ORAL | Status: DC

## 2014-06-02 MED ORDER — AMIODARONE HCL 200 MG PO TABS: 600.0000 mg | ORAL_TABLET | Freq: Every day | ORAL | Status: DC

## 2014-06-02 NOTE — Op Note (Signed)
Procedure: Electrical Cardioversion Indications:  Atrial Fibrillation  Procedure Details:  Consent: Risks of procedure as well as the alternatives and risks of each were explained to the (patient/caregiver).  Consent for procedure obtained.  Time Out: Verified patient identification, verified procedure, site/side was marked, verified correct patient position, special equipment/implants available, medications/allergies/relevent history reviewed, required imaging and test results available.  Performed  Patient placed on cardiac monitor, pulse oximetry, supplemental oxygen as necessary.  Sedation given: propofol 80 mg IV, Dr. Ermalene Postin Pacer pads placed anterior and posterior chest.  Cardioverted 1 time(s).  Cardioverted at 150J. Synchronized biphasic  Evaluation: Findings: Post procedure pacemaker interrogation shows: AV sequential paced rhythm Complications: None Patient did tolerate procedure well.  Slightly elevated free T4 on preop labs - will reduce levothyroxine dose slightly  Time Spent Directly with the Patient:  45 minutes   [name] 06/02/2014, 1:24 PM

## 2014-06-02 NOTE — Anesthesia Postprocedure Evaluation (Signed)
  Anesthesia Post-op Note  Patient: [name]  Procedure(s) Performed: Procedure(s): CARDIOVERSION (N/A)  Patient Location: Endoscopy Unit  Anesthesia Type:General  Level of Consciousness: awake, alert  and oriented  Airway and Oxygen Therapy: Patient Spontanous Breathing  Post-op Pain: none  Post-op Assessment: Post-op Vital signs reviewed, Patient's Cardiovascular Status Stable, Respiratory Function Stable, Patent Airway and No signs of Nausea or vomiting  Post-op Vital Signs: Reviewed and stable  Last Vitals:  Filed Vitals:   06/02/14 1309  BP:   Pulse: 71  Temp:   Resp:     Complications: No apparent anesthesia complications

## 2014-06-02 NOTE — Anesthesia Preprocedure Evaluation (Signed)
Anesthesia Evaluation  Patient identified by MRN, date of birth, ID band Patient awake    Reviewed: Allergy & Precautions, H&P , NPO status , Patient's Chart, lab work & pertinent test results, reviewed documented beta blocker date and time   History of Anesthesia Complications Negative for: history of anesthetic complications  Airway Mallampati: I TM Distance: >3 FB Neck ROM: Full    Dental  (+) Teeth Intact, Dental Advisory Given,    Pulmonary shortness of breath and with exertion, sleep apnea and Continuous Positive Airway Pressure Ventilation ,    Pulmonary exam normal       Cardiovascular hypertension, Pt. on medications and Pt. on home beta blockers + pacemaker Rhythm:Irregular Rate:Normal     Neuro/Psych Fibromyalgia, hydrocodone regimen  Neuromuscular disease    GI/Hepatic negative GI ROS, Neg liver ROS,   Endo/Other  diabetes, Well Controlled, Type 2, Oral Hypoglycemic AgentsHypothyroidism   Renal/GU Acute nephritis untreated s/p nephrectomy 1990      Musculoskeletal  (+) Fibromyalgia -, narcotic dependent  Abdominal   Peds  Hematology  (+) anemia ,   Anesthesia Other Findings   Reproductive/Obstetrics                           Anesthesia Physical Anesthesia Plan  ASA: III  Anesthesia Plan: General   Post-op Pain Management:    Induction: Intravenous  Airway Management Planned: Mask  Additional Equipment:   Intra-op Plan:   Post-operative Plan:   Informed Consent: I have reviewed the patients History and Physical, chart, labs and discussed the procedure including the risks, benefits and alternatives for the proposed anesthesia with the patient or authorized representative who has indicated his/her understanding and acceptance.   Dental advisory given  Plan Discussed with: Anesthesiologist and Surgeon  Anesthesia Plan Comments:         Anesthesia Quick  Evaluation

## 2014-06-02 NOTE — H&P (View-Only) (Signed)
Patient ID: [name], female   DOB: [birth year], 64 y.o.   MRN: [id number]      Reason for office visit Atrial fibrillation (recurrent post cardioversion), chronic systolic heart failure, severe mitral insufficiency, complete heart block status post pacemaker  [name] feels extremely weak and continues to have severe lower extremity edema. The weeping blisters on her legs are slowly improving but she still has occasional fluid leakage. She complains of her mouth feeling dry all the time. She developed widespread tophaceous gout do to treatment with a combination of loop diuretics and metolazone. Her uric acid was up to 16.4. She is now taking Uloric and colchicine. She reports that her last BUN was 90 and the creatinine 1.6, both markedly improved from the recent past.  We have previously estimated her "dry weight" to be around 330 pounds, and that is pretty much what she weighs today.  Pacemaker interrogation shows that she went back into atrial fibrillation roughly 2 months after her successful cardioversion on February 2nd. Notwithstanding some "under sensing" she seems to be in persistent atrial fibrillation since April 9.  [name] has moderately severe nonischemic cardiomyopathy with a left ventricular ejection fraction of 30-35%, severe mitral insufficiency and severe tricuspid insufficiency and moderate to severe pulmonary hypertension. Because of her severe obesity and multiple comorbid conditions she is not a good candidate for surgical valve repair. She is pacemaker dependent, due to complete heart block but also paces the atrium 100% of the time when not in atrial fibrillation, primarily due to treatment with relatively high doses of amiodarone.  She also has a single kidney and at least stage III chronic kidney disease and has had very volatile renal function parameters over the last couple of years. In the past has told me that under no circumstances will she her for consider  Hemodialysis. She now seems to have a more ambiguous position about hemodialysis: "I can stop it if it doesn't work for me". She sees Dr. Lorrene Reid in the nephrology clinic.  Allergies  Allergen Reactions  . Ivp Dye [Iodinated Diagnostic Agents] Shortness Of Breath    Turn red, can't breathe  . Betadine [Povidone Iodine]   . Diphenhydramine Hcl Swelling    In hands and eyes  . Fish Allergy Nausea And Vomiting  . Iohexol      Desc: PT TURNS RED AND WHEEZING   . Penicillins Other (See Comments)    Resp arrest as child  . Povidone-Iodine Other (See Comments)    Wheezing, turn red, can't breath, and blisters  . Promethazine Hcl Other (See Comments)    Low Blood Pressure  . Tape     Blisters, can use paper tape for short periods  . Clindamycin Hives and Rash    wheezing  . Iodine Rash  . Morphine Sulfate Nausea And Vomiting and Rash    Current Outpatient Prescriptions  Medication Sig Dispense Refill  . albuterol (PROVENTIL HFA;VENTOLIN HFA) 108 (90 BASE) MCG/ACT inhaler Inhale 2 puffs into the lungs every 6 (six) hours as needed. For shortness of breath      . albuterol (PROVENTIL) (2.5 MG/3ML) 0.083% nebulizer solution Take 2.5 mg by nebulization every 6 (six) hours as needed. For shortness of breath      . ALPRAZolam (XANAX) 0.25 MG tablet Take 1 tablet (0.25 mg total) by mouth at bedtime as needed for anxiety.  30 tablet  3  . amiodarone (PACERONE) 200 MG tablet Take 1 tablet (200 mg total) by mouth 2 (two) times  daily.  180 tablet  6  . calcitRIOL (ROCALTROL) 0.25 MCG capsule TAKE (1) CAPSULE TWICE DAILY.  60 capsule  6  . calcium citrate-vitamin D 200-200 MG-UNIT TABS Take 1 tablet by mouth 4 (four) times daily -  before meals and at bedtime.      . colchicine 0.6 MG tablet Take 0.6 mg by mouth 2 (two) times daily.      Marland Kitchen COUMADIN 5 MG tablet Take 1 tablet by mouth daily or as directed  90 tablet  1  . digoxin (LANOXIN) 0.125 MG tablet Take 0.5 tablets (0.0625 mg total) by mouth  daily.  15 tablet  10  . glucose blood (ONE TOUCH ULTRA TEST) test strip Test 3 times daily  100 each  12  . glyBURIDE (DIABETA) 5 MG tablet Take 1-2 tablets (5-10 mg total) by mouth 2 (two) times daily with a meal. Take 10mg  in the morning and 5mg  at night  120 tablet  4  . HYDROcodone-acetaminophen (NORCO) 10-325 MG per tablet Take 1 tablet by mouth 2 (two) times daily as needed for pain.  90 tablet  5  . insulin glargine (LANTUS) 100 UNIT/ML injection Use up to 27 units daily      . Insulin Pen Needle (B-D UF III MINI PEN NEEDLES) 31G X 5 MM MISC Check blood sugar up to 6x daily  200 each  3  . iron polysaccharides (NIFEREX) 150 MG capsule Take 150 mg by mouth 2 (two) times daily.      Marland Kitchen JANUVIA 100 MG tablet TAKE 1 TABLET DAILY.  30 tablet  4  . levothyroxine (SYNTHROID, LEVOTHROID) 125 MCG tablet Take 125 mcg by mouth daily before breakfast.      . metolazone (ZAROXOLYN) 5 MG tablet Take 2.5 mg by mouth daily as needed. If up 4-5 lbs.      . metoprolol tartrate (LOPRESSOR) 25 MG tablet Take 1.5 tablets (37.5 mg total) by mouth 2 (two) times daily.  90 tablet  11  . Multiple Vitamin (MULTIVITAMIN) tablet Take 1 tablet by mouth daily.       . nitroGLYCERIN (NITRODUR - DOSED IN MG/24 HR) 0.4 mg/hr patch Place 1 patch (0.4 mg total) onto the skin every other day.  30 patch  6  . nitroGLYCERIN (NITROSTAT) 0.4 MG SL tablet Place 0.4 mg under the tongue every 5 (five) minutes as needed. For chest pain      . potassium chloride SA (K-DUR,KLOR-CON) 20 MEQ tablet Take 1 tablet (20 mEq total) by mouth 4 (four) times daily.  120 tablet  11  . torsemide (DEMADEX) 100 MG tablet Take 50 mg by mouth 2 (two) times daily.       Marland Kitchen ULORIC 40 MG tablet Take 40 mg by mouth daily.      . Vitamin D, Ergocalciferol, (DRISDOL) 50000 UNITS CAPS capsule Takes on tuesdays and fridays  30 capsule  12  . VOLTAREN 1 % GEL 4 (four) times daily as needed.       No current facility-administered medications for this visit.     Past Medical History  Diagnosis Date  . Personal history of other diseases of circulatory system   . 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 Chronic lymphocytic thyroiditis   . Morbid obesity   . Other chronic pulmonary heart diseases   . Obstructive sleep apnea (adult) (pediatric)   . Other and unspecified hyperlipidemia   . Fibromyalgia   . Goiter   .  Thyroiditis, autoimmune   . Hypothyroidism, acquired, autoimmune   . Solitary kidney, acquired   . Fatigue   . Hyperparathyroidism, secondary   . Vitamin D deficiency disease   . Hyperparathyroidism   . H/O varicose veins 03/27/12  . Type II or unspecified type diabetes mellitus without mention of complication, not stated as uncontrolled   . Diabetes mellitus type II   . Infections of kidney 03/27/12  . History of measles, mumps, or rubella 03/27/12  . Tubal pregnancy 12/23/76  . Unspecified essential hypertension     20 yrs ago  . Blood transfusion 1977    [facility]  . Complication of anesthesia 2010    hard to wake up after knee surgery  . Anemia   . Cardiomyopathy, nonischemic     EF 45 %  . Endometrial hyperplasia with atypia 05/05/12  . Diastolic HF (heart failure) 11/05/2012  . A-fib   . Echocardiogram abnormal 07/15/12    severe MR,mod to severe TR,mod to severe PA hypertension  . Pulmonary arterial hypertension   . PAF (paroxysmal atrial fibrillation), 1st episode since DCCV in Jan/14 03/19/2013  . Ulcers of both lower extremities, small, now with cellulitis 06/05/2013    Past Surgical History  Procedure Laterality Date  . Cholecystectomy    . Pacemaker placement  02/26/11    Medtronic Adapta  . Knee surgery    . Resection duplicate left kidney    . 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 Hernia repair  1999    ruptured ;  . D& c with hysteroscopy & cervical polypectomy  05/05/12  . Cardioversion N/A 02/06/2013    Successful  . Iud removal  01/30/13  . Ectopic pregnancy surgery  1978   . Cardioversion N/A 06/08/2013    Procedure: CARDIOVERSION;  Surgeon: Sanda Klein, MD;  Location: [facility];  Service: Cardiovascular;  Laterality: N/A;  Room [phone]  . Cardioversion N/A 02/01/2014    Procedure: CARDIOVERSION;  Surgeon: Sanda Klein, MD;  Location: [facility];  Service: Cardiovascular;  Laterality: N/A;    Family History  Problem Relation Age of Onset  . Cardiomyopathy Brother   . Diabetes Mother   . Cancer Maternal Grandmother   . Heart attack Mother     History   Social History  . Marital Status: Married    Spouse Name: N/A    Number of Children: N/A  . Years of Education: N/A   Occupational History  . retired Building services engineer    Social History Main Topics  . Smoking status: Never Smoker   . Smokeless tobacco: Never Used  . Alcohol Use: No  . Drug Use: No  . Sexual Activity: Yes    Partners: Male    Birth Control/ Protection: Post-menopausal   Other Topics Concern  . Not on file   Social History Narrative  . No narrative on file    Review of systems: Severe dyspnea with minimal exertion, possible mild orthopnea, severe lower extremity edema with active weeping and multiple skin wounds, no pathological bleeding, no chest pain, no syncope or palpitations, no fever or chills, no change in bowel pattern, no abdominal pain, recent nausea and vomiting that has resolved, no urinary complaints, no vaginal bleeding, no hot flashes, no new neurological complaints   PHYSICAL EXAM BP 110/64  Pulse 72  Resp 20  Ht 5\' 7"  (1.702 m)  Wt 329 lb (149.233 kg)  BMI 51.52 kg/m2 General appearance: alert, cooperative, no distress and morbidly obese  Jugular  venous pulsations do not appear to be severely elevated, although she has prominent V waves. Neck: no adenopathy, no carotid bruit, supple, symmetrical, trachea midline, thyroid not enlarged, symmetric, no tenderness/mass/nodules and It difficult to see jugular venous pulsations due to obesity  Lungs: clear to  auscultation bilaterally  Heart: regular rate and rhythm, S1: split, S2: Paradoxical splitting, S3 present and systolic murmur: holosystolic 2/6, blowing at apex  Abdomen: soft, non-tender; bowel sounds normal; no masses, no organomegaly  Extremities: edema 3+ symmetrically bilaterally and Multiple 1-5 cm ulcers with active weeping and mild bleeding bilaterally, blisters with overlying devitalized skin  She has developed several gouty tophi most prominently on the right second distal interphalangeal joint and third proximal interphalangeal joint Pulses: 2+ and symmetric  Skin: Ulcers as described above, rounding erythema is worsening and very concerning for cellulitis  Neurologic: Alert and oriented X 3, normal strength and tone. Normal symmetric reflexes. Normal coordination and gait    EKG: Atrial fibrillation, 100% ventricular paced, QRS duration 180 ms, QTC 538 ms  Lipid Panel     Component Value Date/Time   CHOL 149 11/30/2013 0942   TRIG 137 11/30/2013 0942   HDL 33* 11/30/2013 0942   CHOLHDL 4.5 11/30/2013 0942   VLDL 27 11/30/2013 0942   LDLCALC 89 11/30/2013 0942    BMET    Component Value Date/Time   NA 134* 03/30/2014 1720   K 3.6 03/30/2014 1720   CL 89* 03/30/2014 1720   CO2 35* 03/30/2014 1720   GLUCOSE 178* 03/30/2014 1720   BUN 116* 03/30/2014 1720   CREATININE 2.37* 03/30/2014 1720   CREATININE 2.19* 09/21/2013 1002   CREATININE 1.28* 02/12/2011 1000   CALCIUM 9.0 03/30/2014 1720   CALCIUM 9.5 03/24/2012 1212   GFRNONAA 22* 09/21/2013 1002   GFRAA 26* 09/21/2013 1002     ASSESSMENT AND PLAN  Despite being as close to a euvolemic state as we can get her (she still has very severe lower extremity edema), [name] feels poorly. She has a prolonged acute exacerbation of chronic heart failure, with predominantly right heart failure findings As before, she seems to deteriorate and she is in atrial fibrillation. Attempts at long-term maintenance of normal sinus rhythm are likely  doomed to failure because of the severity of her mitral insufficiency and left atrial enlargement, but she does experience significant symptomatic improvement every time she has a cardioversion. We'll schedule her for another cardioversion next week. She has been therapeutically anticoagulated. Will increase the amiodarone to a total of 600 mg daily. I have always been reluctant to refer her for invasive procedures since that there is a high risk of infection, but we'll have to consider referral for cardiac resynchronization pacing. She really has a very broad QRS complex.  Orders Placed This Encounter  Procedures  . Cardioversion external  . TSH  . T4, free  . CBC  . Comprehensive metabolic panel  . Protime-INR  . APTT  . EKG 12-Lead   Meds ordered this encounter  Medications  . colchicine 0.6 MG tablet    Sig: Take 0.6 mg by mouth 2 (two) times daily.  Marland Kitchen ULORIC 40 MG tablet    Sig: Take 40 mg by mouth daily.  . metolazone (ZAROXOLYN) 5 MG tablet    Sig: Take 2.5 mg by mouth daily as needed. If up 4-5 lbs.    Lakecia Deschamps  Sanda Klein, MD, [facility] [phone] office [phone] pager

## 2014-06-02 NOTE — Anesthesia Procedure Notes (Signed)
Date/Time: 06/02/2014 1:01 PM Performed by: [place] Pink Pre-anesthesia Checklist: Patient identified, Emergency Drugs available, Suction available, Patient being monitored and Timeout performed Patient Re-evaluated:Patient Re-evaluated prior to inductionOxygen Delivery Method: Ambu bag Preoxygenation: Pre-oxygenation with 100% oxygen Intubation Type: IV induction Ventilation: Mask ventilation without difficulty Dental Injury: Teeth and Oropharynx as per pre-operative assessment

## 2014-06-02 NOTE — Telephone Encounter (Signed)
Returning your call. °

## 2014-06-02 NOTE — Anesthesia Postprocedure Evaluation (Signed)
  Anesthesia Post-op Note  Patient: [name]  Procedure(s) Performed: Procedure(s): CARDIOVERSION (N/A)  Patient Location: PACU  Anesthesia Type:MAC  Level of Consciousness: awake and alert   Airway and Oxygen Therapy: Patient Spontanous Breathing  Post-op Pain: none  Post-op Assessment: Post-op Vital signs reviewed, Patient's Cardiovascular Status Stable, Respiratory Function Stable, Patent Airway, No signs of Nausea or vomiting and Pain level controlled  Post-op Vital Signs: Reviewed and stable  Last Vitals:  Filed Vitals:   06/02/14 1410  BP: 121/79  Pulse: 68  Temp:   Resp:     Complications: No apparent anesthesia complications

## 2014-06-02 NOTE — Transfer of Care (Signed)
Immediate Anesthesia Transfer of Care Note  Patient: [name]  Procedure(s) Performed: Procedure(s): CARDIOVERSION (N/A)  Patient Location: Endoscopy Unit  Anesthesia Type:General  Level of Consciousness: awake, alert  and oriented  Airway & Oxygen Therapy: Patient Spontanous Breathing  Post-op Assessment: Report given to PACU RN and Post -op Vital signs reviewed and stable  Post vital signs: Reviewed and stable  Complications: No apparent anesthesia complications

## 2014-06-02 NOTE — Discharge Instructions (Addendum)
Please do a pacemaker download in one week. Please note new dose prescription for levothyroxine.    Monitored Anesthesia Care  Monitored anesthesia care is an anesthesia service for a medical procedure. Anesthesia is the loss of the ability to feel pain. It is produced by medications called anesthetics. It may affect a small area of your body (local anesthesia), a large area of your body (regional anesthesia), or your entire body (general anesthesia). The need for monitored anesthesia care depends your procedure, your condition, and the potential need for regional or general anesthesia. It is often provided during procedures where:   General anesthesia may be needed if there are complications. This is because you need special care when you are under general anesthesia.   You will be under local or regional anesthesia. This is so that you are able to have higher levels of anesthesia if needed.   You will receive calming medications (sedatives). This is especially the case if sedatives are given to put you in a semi-conscious state of relaxation (deep sedation). This is because the amount of sedative needed to produce this state can be hard to predict. Too much of a sedative can produce general anesthesia. Monitored anesthesia care is performed by one or more caregivers who have special training in all types of anesthesia. You will need to meet with these caregivers before your procedure. During this meeting, they will ask you about your medical history. They will also give you instructions to follow. (For example, you will need to stop eating and drinking before your procedure. You may also need to stop or change medications you are taking.) During your procedure, your caregivers will stay with you. They will:   Watch your condition. This includes watching you blood pressure, breathing, and level of pain.   Diagnose and treat problems that occur.   Give medications if they are needed. These may  include calming medications (sedatives) and anesthetics.   Make sure you are comfortable.  Having monitored anesthesia care does not necessarily mean that you will be under anesthesia. It does mean that your caregivers will be able to manage anesthesia if you need it or if it occurs. It also means that you will be able to have a different type of anesthesia than you are having if you need it. When your procedure is complete, your caregivers will continue to watch your condition. They will make sure any medications wear off before you are allowed to go home.  Document Released: 09/12/2005 Document Revised: 04/13/2013 Document Reviewed: 01/28/2013 [facility] Patient Information 2014 [place], [place].

## 2014-06-02 NOTE — Progress Notes (Signed)
LMTCB

## 2014-06-02 NOTE — Interval H&P Note (Signed)
History and Physical Interval Note:  06/02/2014 11:26 AM  [name]  has presented today for surgery, with the diagnosis of A FIB  The various methods of treatment have been discussed with the patient and family. After consideration of risks, benefits and other options for treatment, the patient has consented to  Procedure(s): CARDIOVERSION (N/A) as a surgical intervention .  The patient's history has been reviewed, patient examined, no change in status, stable for surgery.  I have reviewed the patient's chart and labs.  Questions were answered to the patient's satisfaction.     Eliu Batch

## 2014-06-02 NOTE — Telephone Encounter (Signed)
Patient notified of lab results

## 2014-06-03 ENCOUNTER — Encounter (HOSPITAL_COMMUNITY): Payer: Self-pay | Admitting: Cardiovascular Disease

## 2014-06-03 ENCOUNTER — Telehealth: Payer: Self-pay | Admitting: Cardiovascular Disease

## 2014-06-03 NOTE — Telephone Encounter (Signed)
Closed encounters °

## 2014-06-10 ENCOUNTER — Telehealth: Payer: Self-pay | Admitting: Cardiovascular Disease

## 2014-06-10 MED ORDER — AMIODARONE HCL 200 MG PO TABS: 600.0000 mg | ORAL_TABLET | Freq: Every day | ORAL | Status: DC

## 2014-06-10 NOTE — Telephone Encounter (Signed)
Her medicine was increased,she needs a new prescription. Please call a new prescription for Amiodarone 200 mg #90 to [facility] [phone].

## 2014-06-10 NOTE — Telephone Encounter (Signed)
Rx was sent to pharmacy electronically. 

## 2014-06-17 ENCOUNTER — Ambulatory Visit: Payer: Medicare Other | Admitting: "Endocrinology

## 2014-06-29 ENCOUNTER — Telehealth: Payer: Self-pay | Admitting: Cardiovascular Disease

## 2014-06-29 NOTE — Telephone Encounter (Signed)
Discussed Sequential Compression Pump with Cecilie Kicks, NP and she gave a verbal OK to be called into [facility].  DONE.

## 2014-06-29 NOTE — Telephone Encounter (Signed)
Forward to [place]

## 2014-06-29 NOTE — Telephone Encounter (Signed)
LM I do have the order form and will have Dr. Loletha Grayer review and advise on his return to the office 07/06/14.

## 2014-06-29 NOTE — Telephone Encounter (Signed)
She said she faxed in a cardiac clarence for a compression pump. She was calling to check on the status of it.

## 2014-06-30 ENCOUNTER — Encounter (HOSPITAL_BASED_OUTPATIENT_CLINIC_OR_DEPARTMENT_OTHER): Payer: Medicare Other | Attending: General Surgery

## 2014-06-30 DIAGNOSIS — I4891 Unspecified atrial fibrillation: Secondary | ICD-10-CM | POA: Insufficient documentation

## 2014-06-30 DIAGNOSIS — L02419 Cutaneous abscess of limb, unspecified: Secondary | ICD-10-CM | POA: Insufficient documentation

## 2014-06-30 DIAGNOSIS — E669 Obesity, unspecified: Secondary | ICD-10-CM | POA: Insufficient documentation

## 2014-06-30 DIAGNOSIS — L03119 Cellulitis of unspecified part of limb: Principal | ICD-10-CM

## 2014-06-30 DIAGNOSIS — E119 Type 2 diabetes mellitus without complications: Secondary | ICD-10-CM | POA: Insufficient documentation

## 2014-07-01 ENCOUNTER — Other Ambulatory Visit: Payer: Self-pay | Admitting: "Endocrinology

## 2014-07-08 ENCOUNTER — Telehealth: Payer: Self-pay | Admitting: Cardiovascular Disease

## 2014-07-08 ENCOUNTER — Other Ambulatory Visit: Payer: Self-pay | Admitting: *Deleted

## 2014-07-08 MED ORDER — AMIODARONE HCL 200 MG PO TABS: 400.0000 mg | ORAL_TABLET | Freq: Two times a day (BID) | ORAL | Status: AC

## 2014-07-08 MED ORDER — AMIODARONE HCL 200 MG PO TABS: 800.0000 mg | ORAL_TABLET | Freq: Two times a day (BID) | ORAL | Status: DC

## 2014-07-08 NOTE — Telephone Encounter (Signed)
Spoke with patient she states" she had a dizzy spell  06/19/14.  She was concerned that she had gone back into A-fib post DCCV 06/02/14.  Carelink shows she has been in A-fib since 06/27/12.  She has a follow up appointment with Dr. Sallyanne Kuster in August.  I will forward her Carelink transmission to Dr. Sallyanne Kuster for evaluation.

## 2014-07-08 NOTE — Telephone Encounter (Signed)
[name] is calling because she has fell twice and BP is low and thinks something may be wrong with her pacer . She has an appointment on 08/05/14 and her rheumatologist thinks she needs to come in earlier . Please Call    Thanks

## 2014-07-08 NOTE — Telephone Encounter (Signed)
Talked to paula dupont at the [address] location, was told to tell pt to do a download, pt called back and informed

## 2014-07-08 NOTE — Telephone Encounter (Signed)
Pt thinks something is wrong with her pacemaker, pt keeps falling down wants it to be checked, i'll call her in the meantime

## 2014-07-08 NOTE — Telephone Encounter (Signed)
Pt. Called  And stated she has fallen twice second time was at Dr. Audelia Acton  [facility]  [facility] and fell in the parking lot

## 2014-07-08 NOTE — Telephone Encounter (Signed)
I spoke to pt and she told me she was just getting ready to send in a download

## 2014-07-22 ENCOUNTER — Encounter: Payer: Self-pay | Admitting: Cardiovascular Disease

## 2014-07-23 ENCOUNTER — Inpatient Hospital Stay (HOSPITAL_COMMUNITY)
Admission: EM | Admit: 2014-07-23 | Discharge: 2014-08-03 | DRG: 570 | Disposition: A | Discharge status: Skilled Nursing Facility | Payer: Medicare Other | Attending: Internal Medicine | Admitting: Internal Medicine

## 2014-07-23 ENCOUNTER — Encounter (HOSPITAL_COMMUNITY): Payer: Self-pay | Admitting: Emergency Medicine

## 2014-07-23 DIAGNOSIS — N2581 Secondary hyperparathyroidism of renal origin: Secondary | ICD-10-CM

## 2014-07-23 DIAGNOSIS — Z794 Long term (current) use of insulin: Secondary | ICD-10-CM | POA: Diagnosis not present

## 2014-07-23 DIAGNOSIS — D649 Anemia, unspecified: Secondary | ICD-10-CM | POA: Diagnosis present

## 2014-07-23 DIAGNOSIS — R7989 Other specified abnormal findings of blood chemistry: Secondary | ICD-10-CM | POA: Diagnosis present

## 2014-07-23 DIAGNOSIS — L270 Generalized skin eruption due to drugs and medicaments taken internally: Secondary | ICD-10-CM | POA: Diagnosis not present

## 2014-07-23 DIAGNOSIS — E559 Vitamin D deficiency, unspecified: Secondary | ICD-10-CM | POA: Diagnosis present

## 2014-07-23 DIAGNOSIS — M329 Systemic lupus erythematosus, unspecified: Secondary | ICD-10-CM | POA: Diagnosis present

## 2014-07-23 DIAGNOSIS — IMO0001 Reserved for inherently not codable concepts without codable children: Secondary | ICD-10-CM | POA: Diagnosis present

## 2014-07-23 DIAGNOSIS — I10 Essential (primary) hypertension: Secondary | ICD-10-CM | POA: Diagnosis present

## 2014-07-23 DIAGNOSIS — I079 Rheumatic tricuspid valve disease, unspecified: Secondary | ICD-10-CM | POA: Diagnosis present

## 2014-07-23 DIAGNOSIS — Z888 Allergy status to other drugs, medicaments and biological substances status: Secondary | ICD-10-CM | POA: Diagnosis not present

## 2014-07-23 DIAGNOSIS — A414 Sepsis due to anaerobes: Secondary | ICD-10-CM | POA: Diagnosis present

## 2014-07-23 DIAGNOSIS — W19XXXA Unspecified fall, initial encounter: Secondary | ICD-10-CM | POA: Diagnosis not present

## 2014-07-23 DIAGNOSIS — Z91013 Allergy to seafood: Secondary | ICD-10-CM

## 2014-07-23 DIAGNOSIS — Z9109 Other allergy status, other than to drugs and biological substances: Secondary | ICD-10-CM | POA: Diagnosis not present

## 2014-07-23 DIAGNOSIS — I059 Rheumatic mitral valve disease, unspecified: Secondary | ICD-10-CM | POA: Diagnosis present

## 2014-07-23 DIAGNOSIS — E860 Dehydration: Secondary | ICD-10-CM | POA: Diagnosis present

## 2014-07-23 DIAGNOSIS — L97911 Non-pressure chronic ulcer of unspecified part of right lower leg limited to breakdown of skin: Secondary | ICD-10-CM

## 2014-07-23 DIAGNOSIS — Z8619 Personal history of other infectious and parasitic diseases: Secondary | ICD-10-CM

## 2014-07-23 DIAGNOSIS — Z885 Allergy status to narcotic agent status: Secondary | ICD-10-CM

## 2014-07-23 DIAGNOSIS — I2789 Other specified pulmonary heart diseases: Secondary | ICD-10-CM

## 2014-07-23 DIAGNOSIS — E785 Hyperlipidemia, unspecified: Secondary | ICD-10-CM | POA: Diagnosis present

## 2014-07-23 DIAGNOSIS — Z91041 Radiographic dye allergy status: Secondary | ICD-10-CM | POA: Diagnosis not present

## 2014-07-23 DIAGNOSIS — E662 Morbid (severe) obesity with alveolar hypoventilation: Secondary | ICD-10-CM | POA: Diagnosis present

## 2014-07-23 DIAGNOSIS — E213 Hyperparathyroidism, unspecified: Secondary | ICD-10-CM | POA: Diagnosis present

## 2014-07-23 DIAGNOSIS — R7881 Bacteremia: Secondary | ICD-10-CM

## 2014-07-23 DIAGNOSIS — T368X5A Adverse effect of other systemic antibiotics, initial encounter: Secondary | ICD-10-CM | POA: Diagnosis not present

## 2014-07-23 DIAGNOSIS — I5043 Acute on chronic combined systolic (congestive) and diastolic (congestive) heart failure: Secondary | ICD-10-CM | POA: Diagnosis present

## 2014-07-23 DIAGNOSIS — I509 Heart failure, unspecified: Secondary | ICD-10-CM | POA: Diagnosis present

## 2014-07-23 DIAGNOSIS — Z6841 Body Mass Index (BMI) 40.0 and over, adult: Secondary | ICD-10-CM | POA: Diagnosis not present

## 2014-07-23 DIAGNOSIS — Z905 Acquired absence of kidney: Secondary | ICD-10-CM

## 2014-07-23 DIAGNOSIS — I428 Other cardiomyopathies: Secondary | ICD-10-CM

## 2014-07-23 DIAGNOSIS — I4891 Unspecified atrial fibrillation: Secondary | ICD-10-CM | POA: Diagnosis present

## 2014-07-23 DIAGNOSIS — Z833 Family history of diabetes mellitus: Secondary | ICD-10-CM

## 2014-07-23 DIAGNOSIS — E875 Hyperkalemia: Secondary | ICD-10-CM | POA: Diagnosis present

## 2014-07-23 DIAGNOSIS — Z8249 Family history of ischemic heart disease and other diseases of the circulatory system: Secondary | ICD-10-CM

## 2014-07-23 DIAGNOSIS — E1169 Type 2 diabetes mellitus with other specified complication: Secondary | ICD-10-CM | POA: Diagnosis present

## 2014-07-23 DIAGNOSIS — L03119 Cellulitis of unspecified part of limb: Secondary | ICD-10-CM | POA: Diagnosis not present

## 2014-07-23 DIAGNOSIS — N184 Chronic kidney disease, stage 4 (severe): Secondary | ICD-10-CM | POA: Diagnosis present

## 2014-07-23 DIAGNOSIS — D472 Monoclonal gammopathy: Secondary | ICD-10-CM

## 2014-07-23 DIAGNOSIS — Z880 Allergy status to penicillin: Secondary | ICD-10-CM

## 2014-07-23 DIAGNOSIS — L299 Pruritus, unspecified: Secondary | ICD-10-CM | POA: Diagnosis not present

## 2014-07-23 DIAGNOSIS — N8502 Endometrial intraepithelial neoplasia [EIN]: Secondary | ICD-10-CM

## 2014-07-23 DIAGNOSIS — IMO0002 Reserved for concepts with insufficient information to code with codable children: Secondary | ICD-10-CM | POA: Diagnosis present

## 2014-07-23 DIAGNOSIS — Z1635 Resistance to multiple antimicrobial drugs: Secondary | ICD-10-CM | POA: Diagnosis present

## 2014-07-23 DIAGNOSIS — Z7901 Long term (current) use of anticoagulants: Secondary | ICD-10-CM

## 2014-07-23 DIAGNOSIS — Z9181 History of falling: Secondary | ICD-10-CM | POA: Diagnosis not present

## 2014-07-23 DIAGNOSIS — E063 Autoimmune thyroiditis: Secondary | ICD-10-CM | POA: Diagnosis present

## 2014-07-23 DIAGNOSIS — I959 Hypotension, unspecified: Secondary | ICD-10-CM | POA: Diagnosis present

## 2014-07-23 DIAGNOSIS — Z79899 Other long term (current) drug therapy: Secondary | ICD-10-CM | POA: Diagnosis not present

## 2014-07-23 DIAGNOSIS — N179 Acute kidney failure, unspecified: Secondary | ICD-10-CM | POA: Diagnosis present

## 2014-07-23 DIAGNOSIS — I129 Hypertensive chronic kidney disease with stage 1 through stage 4 chronic kidney disease, or unspecified chronic kidney disease: Secondary | ICD-10-CM | POA: Diagnosis present

## 2014-07-23 DIAGNOSIS — I872 Venous insufficiency (chronic) (peripheral): Secondary | ICD-10-CM | POA: Diagnosis present

## 2014-07-23 DIAGNOSIS — I480 Paroxysmal atrial fibrillation: Secondary | ICD-10-CM | POA: Diagnosis present

## 2014-07-23 DIAGNOSIS — E876 Hypokalemia: Secondary | ICD-10-CM

## 2014-07-23 DIAGNOSIS — G589 Mononeuropathy, unspecified: Secondary | ICD-10-CM | POA: Diagnosis present

## 2014-07-23 DIAGNOSIS — M109 Gout, unspecified: Secondary | ICD-10-CM | POA: Diagnosis present

## 2014-07-23 DIAGNOSIS — I340 Nonrheumatic mitral (valve) insufficiency: Secondary | ICD-10-CM

## 2014-07-23 DIAGNOSIS — I890 Lymphedema, not elsewhere classified: Secondary | ICD-10-CM | POA: Diagnosis present

## 2014-07-23 DIAGNOSIS — R609 Edema, unspecified: Secondary | ICD-10-CM | POA: Diagnosis present

## 2014-07-23 DIAGNOSIS — L97929 Non-pressure chronic ulcer of unspecified part of left lower leg with unspecified severity: Secondary | ICD-10-CM

## 2014-07-23 DIAGNOSIS — E038 Other specified hypothyroidism: Secondary | ICD-10-CM | POA: Diagnosis present

## 2014-07-23 DIAGNOSIS — A419 Sepsis, unspecified organism: Secondary | ICD-10-CM | POA: Diagnosis present

## 2014-07-23 DIAGNOSIS — E669 Obesity, unspecified: Secondary | ICD-10-CM | POA: Diagnosis not present

## 2014-07-23 DIAGNOSIS — L0291 Cutaneous abscess, unspecified: Secondary | ICD-10-CM | POA: Diagnosis present

## 2014-07-23 DIAGNOSIS — L02419 Cutaneous abscess of limb, unspecified: Secondary | ICD-10-CM | POA: Diagnosis present

## 2014-07-23 DIAGNOSIS — R739 Hyperglycemia, unspecified: Secondary | ICD-10-CM

## 2014-07-23 DIAGNOSIS — L97109 Non-pressure chronic ulcer of unspecified thigh with unspecified severity: Secondary | ICD-10-CM | POA: Diagnosis not present

## 2014-07-23 DIAGNOSIS — L0390 Cellulitis, unspecified: Secondary | ICD-10-CM

## 2014-07-23 DIAGNOSIS — G4733 Obstructive sleep apnea (adult) (pediatric): Secondary | ICD-10-CM | POA: Diagnosis present

## 2014-07-23 DIAGNOSIS — D509 Iron deficiency anemia, unspecified: Secondary | ICD-10-CM

## 2014-07-23 DIAGNOSIS — E049 Nontoxic goiter, unspecified: Secondary | ICD-10-CM

## 2014-07-23 DIAGNOSIS — E119 Type 2 diabetes mellitus without complications: Secondary | ICD-10-CM | POA: Diagnosis present

## 2014-07-23 DIAGNOSIS — R0602 Shortness of breath: Secondary | ICD-10-CM

## 2014-07-23 DIAGNOSIS — L97921 Non-pressure chronic ulcer of unspecified part of left lower leg limited to breakdown of skin: Secondary | ICD-10-CM

## 2014-07-23 DIAGNOSIS — Z950 Presence of cardiac pacemaker: Secondary | ICD-10-CM

## 2014-07-23 DIAGNOSIS — L97919 Non-pressure chronic ulcer of unspecified part of right lower leg with unspecified severity: Secondary | ICD-10-CM

## 2014-07-23 DIAGNOSIS — M797 Fibromyalgia: Secondary | ICD-10-CM

## 2014-07-23 DIAGNOSIS — I5042 Chronic combined systolic (congestive) and diastolic (congestive) heart failure: Secondary | ICD-10-CM

## 2014-07-23 DIAGNOSIS — A498 Other bacterial infections of unspecified site: Secondary | ICD-10-CM

## 2014-07-23 LAB — CBC WITH DIFFERENTIAL/PLATELET
BASOS ABS: 0 10*3/uL (ref 0.0–0.1)
Basophils Relative: 0 % (ref 0–1)
Eosinophils Absolute: 0 10*3/uL (ref 0.0–0.7)
Eosinophils Relative: 0 % (ref 0–5)
HEMATOCRIT: 31.2 % — AB (ref 36.0–46.0)
HEMOGLOBIN: 10.2 g/dL — AB (ref 12.0–15.0)
LYMPHS PCT: 11 % — AB (ref 12–46)
Lymphs Abs: 0.6 10*3/uL — ABNORMAL LOW (ref 0.7–4.0)
MCH: 31.4 pg (ref 26.0–34.0)
MCHC: 32.7 g/dL (ref 30.0–36.0)
MCV: 96 fL (ref 78.0–100.0)
MONO ABS: 0.2 10*3/uL (ref 0.1–1.0)
MONOS PCT: 3 % (ref 3–12)
Neutro Abs: 4.9 10*3/uL (ref 1.7–7.7)
Neutrophils Relative %: 86 % — ABNORMAL HIGH (ref 43–77)
Platelets: 250 10*3/uL (ref 150–400)
RBC: 3.25 MIL/uL — ABNORMAL LOW (ref 3.87–5.11)
RDW: 18.1 % — AB (ref 11.5–15.5)
WBC: 5.7 10*3/uL (ref 4.0–10.5)

## 2014-07-23 LAB — BASIC METABOLIC PANEL
Anion gap: 14 (ref 5–15)
BUN: 75 mg/dL — ABNORMAL HIGH (ref 6–23)
CO2: 30 mEq/L (ref 19–32)
CREATININE: 2.2 mg/dL — AB (ref 0.50–1.10)
Calcium: 9.1 mg/dL (ref 8.4–10.5)
Chloride: 91 mEq/L — ABNORMAL LOW (ref 96–112)
GFR calc Af Amer: 26 mL/min — ABNORMAL LOW (ref >90)
GFR calc non Af Amer: 22 mL/min — ABNORMAL LOW (ref >90)
Glucose, Bld: 194 mg/dL — ABNORMAL HIGH (ref 70–99)
Potassium: 3.1 mEq/L — ABNORMAL LOW (ref 3.7–5.3)
Sodium: 135 mEq/L — ABNORMAL LOW (ref 137–147)

## 2014-07-23 LAB — URINALYSIS, ROUTINE W REFLEX MICROSCOPIC
Bilirubin Urine: NEGATIVE
GLUCOSE, UA: NEGATIVE mg/dL
Ketones, ur: NEGATIVE mg/dL
Leukocytes, UA: NEGATIVE
NITRITE: NEGATIVE
PROTEIN: NEGATIVE mg/dL
Specific Gravity, Urine: 1.012 (ref 1.005–1.030)
UROBILINOGEN UA: 0.2 mg/dL (ref 0.0–1.0)
pH: 6 (ref 5.0–8.0)

## 2014-07-23 LAB — PROTIME-INR
INR: 3.78 — ABNORMAL HIGH (ref 0.00–1.49)
PROTHROMBIN TIME: 37.3 s — AB (ref 11.6–15.2)

## 2014-07-23 LAB — URINE MICROSCOPIC-ADD ON

## 2014-07-23 MED ORDER — SODIUM CHLORIDE 0.9 % IJ SOLN
3.0000 mL | Freq: Two times a day (BID) | INTRAMUSCULAR | Status: DC
  Administered 2014-07-24 – 2014-08-02 (×14): 3 mL via INTRAVENOUS

## 2014-07-23 MED ORDER — COLCHICINE 0.6 MG PO TABS
0.6000 mg | ORAL_TABLET | Freq: Two times a day (BID) | ORAL | Status: DC
  Administered 2014-07-25: 0.6 mg via ORAL
  Filled 2014-07-23 (×7): qty 1

## 2014-07-23 MED ORDER — CALCIUM CARBONATE ANTACID 500 MG PO CHEW
1.0000 | CHEWABLE_TABLET | Freq: Four times a day (QID) | ORAL | Status: DC
  Administered 2014-07-25 – 2014-08-03 (×11): 200 mg via ORAL
  Filled 2014-07-23 (×45): qty 1

## 2014-07-23 MED ORDER — GLYBURIDE 2.5 MG PO TABS
2.5000 mg | ORAL_TABLET | Freq: Two times a day (BID) | ORAL | Status: DC
  Administered 2014-07-24: 2.5 mg via ORAL
  Filled 2014-07-23 (×3): qty 1

## 2014-07-23 MED ORDER — ONDANSETRON HCL 4 MG/2ML IJ SOLN: 4.0000 mg | Freq: Four times a day (QID) | INTRAMUSCULAR | Status: DC | PRN

## 2014-07-23 MED ORDER — FEBUXOSTAT 40 MG PO TABS
80.0000 mg | ORAL_TABLET | Freq: Every day | ORAL | Status: DC
  Administered 2014-07-24 – 2014-08-03 (×11): 80 mg via ORAL
  Filled 2014-07-23 (×11): qty 2

## 2014-07-23 MED ORDER — GLYBURIDE 5 MG PO TABS
5.0000 mg | ORAL_TABLET | Freq: Once | ORAL | Status: AC
  Administered 2014-07-24: 5 mg via ORAL
  Filled 2014-07-23: qty 1

## 2014-07-23 MED ORDER — WARFARIN - PHARMACIST DOSING INPATIENT: Freq: Every day | Status: DC

## 2014-07-23 MED ORDER — ALBUTEROL SULFATE (2.5 MG/3ML) 0.083% IN NEBU: 2.5000 mg | INHALATION_SOLUTION | RESPIRATORY_TRACT | Status: DC | PRN

## 2014-07-23 MED ORDER — LEVOFLOXACIN IN D5W 750 MG/150ML IV SOLN
750.0000 mg | INTRAVENOUS | Status: DC
  Administered 2014-07-23 – 2014-07-25 (×2): 750 mg via INTRAVENOUS
  Filled 2014-07-23 (×2): qty 150

## 2014-07-23 MED ORDER — NITROGLYCERIN 0.4 MG/HR TD PT24
0.4000 mg | MEDICATED_PATCH | TRANSDERMAL | Status: DC
  Administered 2014-07-26: 0.4 mg via TRANSDERMAL
  Filled 2014-07-23 (×6): qty 1

## 2014-07-23 MED ORDER — ONDANSETRON HCL 4 MG PO TABS: 4.0000 mg | ORAL_TABLET | Freq: Four times a day (QID) | ORAL | Status: DC | PRN

## 2014-07-23 MED ORDER — HYDROMORPHONE HCL PF 1 MG/ML IJ SOLN
0.5000 mg | Freq: Once | INTRAMUSCULAR | Status: AC
  Administered 2014-07-24: 0.5 mg via INTRAVENOUS
  Filled 2014-07-23: qty 1

## 2014-07-23 MED ORDER — CALCIUM CITRATE-VITAMIN D 200-200 MG-UNIT PO TABS: 1.0000 | ORAL_TABLET | Freq: Four times a day (QID) | ORAL | Status: DC

## 2014-07-23 MED ORDER — TORSEMIDE 100 MG PO TABS
100.0000 mg | ORAL_TABLET | Freq: Every day | ORAL | Status: DC
  Administered 2014-07-24 – 2014-07-28 (×5): 100 mg via ORAL
  Filled 2014-07-23 (×6): qty 1

## 2014-07-23 MED ORDER — SODIUM CHLORIDE 0.9 % IV SOLN: 250.0000 mL | INTRAVENOUS | Status: DC | PRN

## 2014-07-23 MED ORDER — METOPROLOL TARTRATE 25 MG PO TABS
37.5000 mg | ORAL_TABLET | Freq: Two times a day (BID) | ORAL | Status: DC
  Administered 2014-07-24 – 2014-07-28 (×10): 37.5 mg via ORAL
  Filled 2014-07-23 (×11): qty 1

## 2014-07-23 MED ORDER — ACETAMINOPHEN 325 MG PO TABS: 650.0000 mg | ORAL_TABLET | Freq: Four times a day (QID) | ORAL | Status: DC | PRN

## 2014-07-23 MED ORDER — ACETAMINOPHEN 650 MG RE SUPP: 650.0000 mg | Freq: Four times a day (QID) | RECTAL | Status: DC | PRN

## 2014-07-23 MED ORDER — SODIUM CHLORIDE 0.9 % IV SOLN
2000.0000 mg | INTRAVENOUS | Status: AC
  Administered 2014-07-23: 2000 mg via INTRAVENOUS
  Filled 2014-07-23: qty 2000

## 2014-07-23 MED ORDER — CALCITRIOL 0.25 MCG PO CAPS
0.2500 ug | ORAL_CAPSULE | Freq: Two times a day (BID) | ORAL | Status: DC
  Administered 2014-07-24 – 2014-08-03 (×22): 0.25 ug via ORAL
  Filled 2014-07-23 (×23): qty 1

## 2014-07-23 MED ORDER — NITROGLYCERIN 0.4 MG SL SUBL: 0.4000 mg | SUBLINGUAL_TABLET | SUBLINGUAL | Status: DC | PRN

## 2014-07-23 MED ORDER — POLYSACCHARIDE IRON COMPLEX 150 MG PO CAPS
150.0000 mg | ORAL_CAPSULE | Freq: Two times a day (BID) | ORAL | Status: DC
  Administered 2014-07-24 – 2014-08-03 (×18): 150 mg via ORAL
  Filled 2014-07-23 (×23): qty 1

## 2014-07-23 MED ORDER — VANCOMYCIN HCL 10 G IV SOLR
2000.0000 mg | INTRAVENOUS | Status: DC
  Administered 2014-07-24: 2000 mg via INTRAVENOUS
  Filled 2014-07-23 (×2): qty 2000

## 2014-07-23 MED ORDER — CHOLECALCIFEROL 10 MCG (400 UNIT) PO TABS
200.0000 [IU] | ORAL_TABLET | Freq: Four times a day (QID) | ORAL | Status: DC
  Administered 2014-07-24 – 2014-08-01 (×31): 200 [IU] via ORAL
  Administered 2014-08-02: 17:00:00 via ORAL
  Administered 2014-08-02 – 2014-08-03 (×4): 200 [IU] via ORAL
  Filled 2014-07-23 (×45): qty 1

## 2014-07-23 MED ORDER — LEVOTHYROXINE SODIUM 125 MCG PO TABS
125.0000 ug | ORAL_TABLET | Freq: Every day | ORAL | Status: DC
  Administered 2014-07-24 – 2014-08-03 (×11): 125 ug via ORAL
  Filled 2014-07-23 (×13): qty 1

## 2014-07-23 MED ORDER — HYDROCODONE-ACETAMINOPHEN 5-325 MG PO TABS
2.0000 | ORAL_TABLET | Freq: Once | ORAL | Status: AC
  Administered 2014-07-23: 2 via ORAL
  Filled 2014-07-23: qty 2

## 2014-07-23 MED ORDER — LINAGLIPTIN 5 MG PO TABS
5.0000 mg | ORAL_TABLET | Freq: Every day | ORAL | Status: DC
  Administered 2014-07-24 – 2014-08-03 (×11): 5 mg via ORAL
  Filled 2014-07-23 (×11): qty 1

## 2014-07-23 MED ORDER — INSULIN GLARGINE 100 UNIT/ML ~~LOC~~ SOLN
28.0000 [IU] | Freq: Every day | SUBCUTANEOUS | Status: DC
  Administered 2014-07-24 (×2): 28 [IU] via SUBCUTANEOUS
  Filled 2014-07-23 (×2): qty 0.28

## 2014-07-23 MED ORDER — TORSEMIDE 20 MG PO TABS
50.0000 mg | ORAL_TABLET | Freq: Every day | ORAL | Status: DC
  Administered 2014-07-24 – 2014-07-28 (×5): 50 mg via ORAL
  Filled 2014-07-23 (×6): qty 1

## 2014-07-23 MED ORDER — VITAMIN D (ERGOCALCIFEROL) 1.25 MG (50000 UNIT) PO CAPS
50000.0000 [IU] | ORAL_CAPSULE | ORAL | Status: DC
  Administered 2014-07-26 – 2014-08-02 (×3): 50000 [IU] via ORAL
  Filled 2014-07-23 (×3): qty 1

## 2014-07-23 MED ORDER — SODIUM CHLORIDE 0.9 % IV SOLN
Freq: Once | INTRAVENOUS | Status: AC
  Administered 2014-07-23: 19:00:00 via INTRAVENOUS

## 2014-07-23 MED ORDER — GUAIFENESIN-DM 100-10 MG/5ML PO SYRP: 5.0000 mL | ORAL_SOLUTION | ORAL | Status: DC | PRN

## 2014-07-23 MED ORDER — ALPRAZOLAM 0.25 MG PO TABS: 0.2500 mg | ORAL_TABLET | Freq: Every evening | ORAL | Status: DC | PRN

## 2014-07-23 MED ORDER — INSULIN ASPART 100 UNIT/ML ~~LOC~~ SOLN
0.0000 [IU] | Freq: Three times a day (TID) | SUBCUTANEOUS | Status: DC
  Administered 2014-07-24: 2 [IU] via SUBCUTANEOUS
  Administered 2014-07-25: 3 [IU] via SUBCUTANEOUS
  Administered 2014-07-26 (×3): 2 [IU] via SUBCUTANEOUS
  Administered 2014-07-27: 3 [IU] via SUBCUTANEOUS
  Administered 2014-07-27 (×2): 2 [IU] via SUBCUTANEOUS
  Administered 2014-07-28: 3 [IU] via SUBCUTANEOUS
  Administered 2014-07-28: 2 [IU] via SUBCUTANEOUS
  Administered 2014-07-28: 3 [IU] via SUBCUTANEOUS
  Administered 2014-07-29: 14:00:00 via SUBCUTANEOUS
  Administered 2014-07-29: 2 [IU] via SUBCUTANEOUS
  Administered 2014-07-29: 3 [IU] via SUBCUTANEOUS
  Administered 2014-07-30: 2 [IU] via SUBCUTANEOUS
  Administered 2014-07-30 (×2): 3 [IU] via SUBCUTANEOUS
  Administered 2014-07-31: 10:00:00 via SUBCUTANEOUS
  Administered 2014-07-31: 3 [IU] via SUBCUTANEOUS
  Administered 2014-07-31: 13:00:00 via SUBCUTANEOUS
  Administered 2014-08-01: 3 [IU] via SUBCUTANEOUS
  Administered 2014-08-01 (×2): 2 [IU] via SUBCUTANEOUS
  Administered 2014-08-02: 3 [IU] via SUBCUTANEOUS
  Administered 2014-08-02 (×2): 2 [IU] via SUBCUTANEOUS
  Administered 2014-08-03: 1 [IU] via SUBCUTANEOUS

## 2014-07-23 MED ORDER — POTASSIUM CHLORIDE CRYS ER 20 MEQ PO TBCR
20.0000 meq | EXTENDED_RELEASE_TABLET | Freq: Four times a day (QID) | ORAL | Status: DC
  Administered 2014-07-24 – 2014-07-30 (×28): 20 meq via ORAL
  Filled 2014-07-23 (×33): qty 1

## 2014-07-23 MED ORDER — ADULT MULTIVITAMIN W/MINERALS CH
1.0000 | ORAL_TABLET | Freq: Every day | ORAL | Status: DC
  Administered 2014-07-24 – 2014-08-03 (×11): 1 via ORAL
  Filled 2014-07-23 (×11): qty 1

## 2014-07-23 MED ORDER — SODIUM CHLORIDE 0.9 % IJ SOLN
3.0000 mL | INTRAMUSCULAR | Status: DC | PRN
  Administered 2014-08-01: 3 mL via INTRAVENOUS

## 2014-07-23 MED ORDER — AMIODARONE HCL 200 MG PO TABS
400.0000 mg | ORAL_TABLET | Freq: Two times a day (BID) | ORAL | Status: DC
  Administered 2014-07-24 – 2014-08-03 (×22): 400 mg via ORAL
  Filled 2014-07-23 (×28): qty 2

## 2014-07-23 NOTE — ED Provider Notes (Signed)
Medical screening examination/treatment/procedure(s) were conducted as a shared visit with non-physician practitioner(s) and myself.  I personally evaluated the patient during the encounter.   EKG Interpretation None      Elderly female with chronic swelling of legs, sent from wound center for cellulitis. Patchy redness of bilateral lower extremities, L inner thigh distal recently incised and drained. Needs IV antibiotics. Admitted.  Osvaldo Shipper, MD 07/23/14 2125

## 2014-07-23 NOTE — ED Notes (Signed)
Initial Contact - pt to [facility], reports was sent from wound care center for IV abx, pt reports chronic wounds to lower legs, worsening over the last week.  Pt with multiple open areas to BLE, red/warm, weeping, +edema.  Pt denies other  Complaints.  Skin otherwise PWD.  A+Ox4.  NAD.

## 2014-07-23 NOTE — ED Notes (Addendum)
Pt presents from [facility] with note from Dr. Jerline Pain: "Patient obese, diabetic, with A. Fib. Has multiple leg abscess and cellulitis of bilateral legs. Abscess cavities - necrotic fat. Plan - To [facility] 1- Cultures, 2- IV antibiotics, 3- Possible I &D."  Pt reports being cardioverted in February and again on June 3rd for a-fib. Pt reports being completely paced and also being on coumadin. Pt is A/O and in NAD.

## 2014-07-23 NOTE — ED Notes (Signed)
Pt alert and oriented in NAD,  Pt request bedside commode to sit on ,  Pt states she walks short distances in home,  And she feels safe sitting on bedside commode

## 2014-07-23 NOTE — ED Provider Notes (Signed)
CSN: [id number]     Arrival date & time 07/23/14  1717 History   First MD Initiated Contact with Patient 07/23/14 1732     Chief Complaint  Patient presents with  . Cellulitis     (Consider location/radiation/quality/duration/timing/severity/associated sxs/prior Treatment) HPI Comments: Patient with h/o cardiomyopathy/pacemaker (EF in 2014 30-35%), afib on coumadin, DM, morbid obesity -- presents from wound care with worsening lower extremity infection. Patient has been treated by wound care at the end of March. She has had multiple ulcerations and abscesses. Over the past one and half weeks her cellulitis has worsened. No fever however patient has had chills. No nausea, vomiting, or diarrhea. Patient's blood sugars are reportedly controlled. No recent antibiotics except for 50mg  doxycycline over past week for stye. She is taking Coumadin as prescribed. The onset of this condition was gradual. The course is gradually worsening. Aggravating factors: none. Alleviating factors: none.    The history is provided by the patient and medical records.    Past Medical History  Diagnosis Date  . Personal history of other diseases of circulatory system   . 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 Chronic lymphocytic thyroiditis   . Morbid obesity   . Other chronic pulmonary heart diseases   . Obstructive sleep apnea (adult) (pediatric)   . Other and unspecified hyperlipidemia   . Fibromyalgia   . Goiter   . Thyroiditis, autoimmune   . Hypothyroidism, acquired, autoimmune   . Solitary kidney, acquired   . Fatigue   . Hyperparathyroidism, secondary   . Vitamin D deficiency disease   . Hyperparathyroidism   . H/O varicose veins 03/27/12  . Type II or unspecified type diabetes mellitus without mention of complication, not stated as uncontrolled   . Diabetes mellitus type II   . Infections of kidney 03/27/12  . History of measles, mumps, or rubella 03/27/12  . Tubal pregnancy  12/23/76  . Unspecified essential hypertension     20 yrs ago  . Blood transfusion 1977    [facility]  . Complication of anesthesia 2010    hard to wake up after knee surgery  . Anemia   . Cardiomyopathy, nonischemic     EF 45 %  . Endometrial hyperplasia with atypia 05/05/12  . Diastolic HF (heart failure) 11/05/2012  . A-fib   . Echocardiogram abnormal 07/15/12    severe MR,mod to severe TR,mod to severe PA hypertension  . Pulmonary arterial hypertension   . PAF (paroxysmal atrial fibrillation), 1st episode since DCCV in Jan/14 03/19/2013  . Ulcers of both lower extremities, small, now with cellulitis 06/05/2013   Past Surgical History  Procedure Laterality Date  . Cholecystectomy    . Pacemaker placement  02/26/11    Medtronic Adapta  . Knee surgery    . Resection duplicate left kidney    . 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 Hernia repair  1999    ruptured ;  . D& c with hysteroscopy & cervical polypectomy  05/05/12  . Cardioversion N/A 02/06/2013    Successful  . Iud removal  01/30/13  . Ectopic pregnancy surgery  1978  . Cardioversion N/A 06/08/2013    Procedure: CARDIOVERSION;  Surgeon: Sanda Klein, MD;  Location: [facility];  Service: Cardiovascular;  Laterality: N/A;  Room [phone]  . Cardioversion N/A 02/01/2014    Procedure: CARDIOVERSION;  Surgeon: Sanda Klein, MD;  Location: [facility];  Service: Cardiovascular;  Laterality: N/A;  . Cardioversion N/A  06/02/2014    Procedure: CARDIOVERSION;  Surgeon: Sanda Klein, MD;  Location: [facility];  Service: Cardiovascular;  Laterality: N/A;   Family History  Problem Relation Age of Onset  . Cardiomyopathy Brother   . Diabetes Mother   . Cancer Maternal Grandmother   . Heart attack Mother    History  Substance Use Topics  . Smoking status: Never Smoker   . Smokeless tobacco: Never Used  . Alcohol Use: No   OB History   Grav Para Term Preterm Abortions TAB SAB Ect Mult Living   1 0   1   1  0     Review of  Systems  Constitutional: Positive for chills. Negative for fever.  HENT: Negative for rhinorrhea and sore throat.   Eyes: Negative for redness.  Respiratory: Negative for cough.   Cardiovascular: Negative for chest pain.  Gastrointestinal: Negative for nausea, vomiting, abdominal pain and diarrhea.  Genitourinary: Negative for dysuria.  Musculoskeletal: Negative for myalgias.  Skin: Positive for color change and wound.  Neurological: Negative for headaches.    Allergies  Ivp dye; Betadine; Diphenhydramine hcl; Fish allergy; Iohexol; Penicillins; Povidone-iodine; Promethazine hcl; Tape; Clindamycin; Iodine; and Morphine sulfate  Home Medications   Prior to Admission medications   Medication Sig Start Date End Date Taking? Authorizing Provider  albuterol (PROVENTIL HFA;VENTOLIN HFA) 108 (90 BASE) MCG/ACT inhaler Inhale 2 puffs into the lungs every 6 (six) hours as needed for wheezing or shortness of breath.     Historical Provider, MD  albuterol (PROVENTIL) (2.5 MG/3ML) 0.083% nebulizer solution Take 2.5 mg by nebulization every 6 (six) hours as needed for wheezing or shortness of breath.     Historical Provider, MD  ALPRAZolam Duanne Moron) 0.25 MG tablet Take 0.25 mg by mouth at bedtime as needed for anxiety. 01/28/14   Mihai Croitoru, MD  amiodarone (PACERONE) 200 MG tablet Take 2 tablets (400 mg total) by mouth 2 (two) times daily. 07/08/14   Mihai Croitoru, MD  calcitRIOL (ROCALTROL) 0.25 MCG capsule Take 0.25 mcg by mouth 2 (two) times daily.    Historical Provider, MD  calcium citrate-vitamin D 200-200 MG-UNIT TABS Take 1 tablet by mouth 4 (four) times daily.     Historical Provider, MD  colchicine 0.6 MG tablet Take 0.6 mg by mouth 2 (two) times daily. 04/26/14   Historical Provider, MD  glucose blood (ONE TOUCH ULTRA TEST) test strip Test 3 times daily 03/09/14   Sherrlyn Hock, MD  glyBURIDE (DIABETA) 5 MG tablet TAKE 1-2 TABLETS TWICE DAILY WITH A MEAL. TAKE 10MG  (2 TABS) IN THE A.M. AND  5MG (1 TAB) AT NIGHT. 07/01/14   Sherrlyn Hock, MD  HYDROcodone-acetaminophen [facility]) 10-325 MG per tablet Take 1 tablet by mouth every 6 (six) hours as needed for moderate pain. 07/15/13   Elby Showers, MD  insulin glargine (LANTUS) 100 UNIT/ML injection Inject 28 Units into the skin at bedtime.  07/03/13   Sherrlyn Hock, MD  Insulin Pen Needle (B-D UF III MINI PEN NEEDLES) 31G X 5 MM MISC Check blood sugar up to 6x daily 08/11/13   Sherrlyn Hock, MD  iron polysaccharides (NIFEREX) 150 MG capsule Take 150 mg by mouth 2 (two) times daily.    Historical Provider, MD  levothyroxine (SYNTHROID) 100 MCG tablet Take 1 tablet (100 mcg total) by mouth daily before breakfast. 06/02/14   Sanda Klein, MD  levothyroxine (SYNTHROID, LEVOTHROID) 125 MCG tablet Take 1 tablet (125 mcg total) by mouth daily before breakfast. 06/02/14  Mihai Croitoru, MD  metolazone (ZAROXOLYN) 5 MG tablet Take 2.5 mg by mouth daily as needed (for weight gain). If up 4-5 lbs. 03/12/14   Mihai Croitoru, MD  metoprolol tartrate (LOPRESSOR) 25 MG tablet Take 37.5 mg by mouth 2 (two) times daily. 10/08/13   Mihai Croitoru, MD  Multiple Vitamin (MULTIVITAMIN) tablet Take 1 tablet by mouth daily.     Historical Provider, MD  nitroGLYCERIN (NITRODUR - DOSED IN MG/24 HR) 0.4 mg/hr patch Place 1 patch (0.4 mg total) onto the skin every other day. 01/28/14   Mihai Croitoru, MD  nitroGLYCERIN (NITROSTAT) 0.4 MG SL tablet Place 0.4 mg under the tongue every 5 (five) minutes as needed. For chest pain    Historical Provider, MD  potassium chloride SA (K-DUR,KLOR-CON) 20 MEQ tablet Take 1 tablet (20 mEq total) by mouth 4 (four) times daily. 03/08/14   Mihai Croitoru, MD  sitaGLIPtin (JANUVIA) 100 MG tablet Take 100 mg by mouth daily.    Historical Provider, MD  torsemide (DEMADEX) 100 MG tablet Take 50 mg by mouth 2 (two) times daily.  03/08/14   Mihai Croitoru, MD  ULORIC 40 MG tablet Take 40 mg by mouth daily. 05/21/14   Historical Provider, MD   Vitamin D, Ergocalciferol, (DRISDOL) 50000 UNITS CAPS capsule Take 50,000 Units by mouth 2 (two) times a week. Takes on Tuesdays and Fridays 03/09/14 03/10/15  Sherrlyn Hock, MD  VOLTAREN 1 % GEL Apply 2 g topically 4 (four) times daily as needed (for pain).  03/11/14   Historical Provider, MD  warfarin (COUMADIN) 5 MG tablet Take 2.5-5 mg by mouth daily. Takes 5 mg on Sunday, Tuesday, Thursday and Saturday and 2.5 mg all other days 01/20/14   Tommy Medal, RPH-CPP   BP 99/55  Pulse 72  Temp(Src) 98.3 F (36.8 C) (Oral)  Resp 18  SpO2 99%  Physical Exam  Nursing note and vitals reviewed. Constitutional: She appears well-developed and well-nourished.  HENT:  Head: Normocephalic and atraumatic.  Eyes: Conjunctivae are normal. Right eye exhibits no discharge. Left eye exhibits no discharge.  Neck: Normal range of motion. Neck supple.  Cardiovascular: Normal rate, regular rhythm and normal heart sounds.   Pulmonary/Chest: Effort normal and breath sounds normal.  Abdominal: Soft. There is no tenderness.  Neurological: She is alert.  Skin: Skin is warm and dry. There is erythema.  Bilateral lower extremity edema with extensive cellulitis, multiple ulcerations, and abscesses. L inner thigh abscess currently packed.   Psychiatric: She has a normal mood and affect.    ED Course  Procedures (including critical care time) Labs Review Labs Reviewed  CBC WITH DIFFERENTIAL - Abnormal; Notable for the following:    RBC 3.25 (*)    Hemoglobin 10.2 (*)    HCT 31.2 (*)    RDW 18.1 (*)    Neutrophils Relative % 86 (*)    Lymphocytes Relative 11 (*)    Lymphs Abs 0.6 (*)    All other components within normal limits  BASIC METABOLIC PANEL - Abnormal; Notable for the following:    Sodium 135 (*)    Potassium 3.1 (*)    Chloride 91 (*)    Glucose, Bld 194 (*)    BUN 75 (*)    Creatinine, Ser 2.20 (*)    GFR calc non Af Amer 22 (*)    GFR calc Af Amer 26 (*)    All other components  within normal limits  PROTIME-INR - Abnormal; Notable for the following:  Prothrombin Time 37.3 (*)    INR 3.78 (*)    All other components within normal limits  URINALYSIS, ROUTINE W REFLEX MICROSCOPIC - Abnormal; Notable for the following:    Hgb urine dipstick MODERATE (*)    All other components within normal limits  CULTURE, BLOOD (ROUTINE X 2)  CULTURE, BLOOD (ROUTINE X 2)  URINE MICROSCOPIC-ADD ON    Imaging Review No results found.   EKG Interpretation None      5:38 PM Patient in restroom.   6:15 PM Patient seen and examined. Work-up initiated. Medications ordered.   Vital signs reviewed and are as follows: Filed Vitals:   07/23/14 1724  BP: 99/55  Pulse: 72  Temp: 98.3 F (36.8 C)  Resp: 18   8:11 PM Pt previously discussed and seen by Dr. Mingo Amber.   Patient discussed with Dr. Sloan Leiter who will see. Asked that I add medication for gram negative coverage. Cefepime due to anaphylactic reaction to penicillin. Spoke with pharmacist. Options are try cefepime with low risk of cross reactivity, aztreonam, primaxin. Will defer to hospitalist.   MDM   Final diagnoses:  Cellulitis and abscess  Hyperglycemia   Admit.     Carlisle Cater, PA-C 07/23/14 2014

## 2014-07-23 NOTE — Progress Notes (Addendum)
ANTIBIOTIC CONSULT NOTE - INITIAL  Pharmacy Consult for vancomycin/levofloxacin Indication: Cellulitis  Allergies  Allergen Reactions  . Ivp Dye [Iodinated Diagnostic Agents] Shortness Of Breath    Turn red, can't breathe  . Betadine [Povidone Iodine]   . Diphenhydramine Hcl Swelling    In hands and eyes  . Fish Allergy Nausea And Vomiting  . Iohexol      Desc: PT TURNS RED AND WHEEZING   . Penicillins Other (See Comments)    Resp arrest as child  . Povidone-Iodine Other (See Comments)    Wheezing, turn red, can't breath, and blisters  . Promethazine Hcl Other (See Comments)    Low Blood Pressure  . Tape     Blisters, can use paper tape for short periods  . Clindamycin Hives and Rash    wheezing  . Iodine Rash  . Morphine Sulfate Nausea And Vomiting and Rash    Patient Measurements:   Adjusted Body Weight:   Vital Signs: Temp: 98.3 F (36.8 C) (07/24 1724) Temp src: Oral (07/24 1724) BP: 99/55 mmHg (07/24 1724) Pulse Rate: 72 (07/24 1724) Intake/Output from previous day:   Intake/Output from this shift:    Labs: No results found for this basename: WBC, HGB, PLT, LABCREA, CREATININE,  in the last 72 hours The CrCl is unknown because both a height and weight (above a minimum accepted value) are required for this calculation. No results found for this basename: VANCOTROUGH, VANCOPEAK, VANCORANDOM, GENTTROUGH, GENTPEAK, GENTRANDOM, TOBRATROUGH, TOBRAPEAK, TOBRARND, AMIKACINPEAK, AMIKACINTROU, AMIKACIN,  in the last 72 hours   Microbiology: No results found for this or any previous visit (from the past 720 hour(s)).  Medical History: Past Medical History  Diagnosis Date  . Personal history of other diseases of circulatory system   . 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 Chronic lymphocytic thyroiditis   . Morbid obesity   . Other chronic pulmonary heart diseases   . Obstructive sleep apnea (adult) (pediatric)   . Other and unspecified  hyperlipidemia   . Fibromyalgia   . Goiter   . Thyroiditis, autoimmune   . Hypothyroidism, acquired, autoimmune   . Solitary kidney, acquired   . Fatigue   . Hyperparathyroidism, secondary   . Vitamin D deficiency disease   . Hyperparathyroidism   . H/O varicose veins 03/27/12  . Type II or unspecified type diabetes mellitus without mention of complication, not stated as uncontrolled   . Diabetes mellitus type II   . Infections of kidney 03/27/12  . History of measles, mumps, or rubella 03/27/12  . Tubal pregnancy 12/23/76  . Unspecified essential hypertension     20 yrs ago  . Blood transfusion 1977    [facility]  . Complication of anesthesia 2010    hard to wake up after knee surgery  . Anemia   . Cardiomyopathy, nonischemic     EF 45 %  . Endometrial hyperplasia with atypia 05/05/12  . Diastolic HF (heart failure) 11/05/2012  . A-fib   . Echocardiogram abnormal 07/15/12    severe MR,mod to severe TR,mod to severe PA hypertension  . Pulmonary arterial hypertension   . PAF (paroxysmal atrial fibrillation), 1st episode since DCCV in Jan/14 03/19/2013  . Ulcers of both lower extremities, small, now with cellulitis 06/05/2013    Assessment: 59 YOF presents to ED with LE infection, per ED note she has multiple ulcerations and abscesses that have worsened over the last 1.5 weeks.  SCr is elevated with h/o CRI.  7/24 >> vancomycin  >> 7/24 >> levofloxacin  Tmax: afeb WBCs: WNL Renal: SCr = 2.2, Normalized CrCl = 59ml/min  7/24 blood: 7/24 urine:    Goal of Therapy:  Vancomycin trough level 10-15 mcg/ml  Plan:   Vancomycin 2gm IV x 1 then 2gm IV q24h  Check steady state trough  Levofloxacin 750mg  IV q48h  Doreene Eland, PharmD, BCPS.   Pager: [phone]  07/23/2014,6:38 PM

## 2014-07-23 NOTE — Progress Notes (Signed)
Utilization Review completed.  Mingo Siegert RN CM  

## 2014-07-23 NOTE — Progress Notes (Signed)
Patient refused CPAP for the night. Patient wanted to wear nasal cannula instead of CPAP.Will continue to monitor patient.

## 2014-07-23 NOTE — Progress Notes (Signed)
ANTICOAGULATION CONSULT NOTE - Initial Consult  Pharmacy Consult for warfarin Indication: afib   Allergies  Allergen Reactions  . Ivp Dye [Iodinated Diagnostic Agents] Shortness Of Breath    Turn red, can't breathe  . Betadine [Povidone Iodine]   . Diphenhydramine Hcl Swelling    In hands and eyes  . Fish Allergy Nausea And Vomiting  . Iohexol      Desc: PT TURNS RED AND WHEEZING   . Penicillins Other (See Comments)    Resp arrest as child  . Povidone-Iodine Other (See Comments)    Wheezing, turn red, can't breath, and blisters  . Promethazine Hcl Other (See Comments)    Low Blood Pressure  . Tape     Blisters, can use paper tape for short periods  . Clindamycin Hives and Rash    wheezing  . Iodine Rash  . Morphine Sulfate Nausea And Vomiting and Rash    Patient Measurements:   Vital Signs: Temp: 98.3 F (36.8 C) (07/24 1724) Temp src: Oral (07/24 1724) BP: 99/55 mmHg (07/24 1724) Pulse Rate: 72 (07/24 1724)  Labs:  Recent Labs  07/23/14 1803  HGB 10.2*  HCT 31.2*  PLT 250  LABPROT 37.3*  INR 3.78*  CREATININE 2.20*    The CrCl is unknown because both a height and weight (above a minimum accepted value) are required for this calculation.   Medical History: Past Medical History  Diagnosis Date  . Personal history of other diseases of circulatory system   . 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 Chronic lymphocytic thyroiditis   . Morbid obesity   . Other chronic pulmonary heart diseases   . Obstructive sleep apnea (adult) (pediatric)   . Other and unspecified hyperlipidemia   . Fibromyalgia   . Goiter   . Thyroiditis, autoimmune   . Hypothyroidism, acquired, autoimmune   . Solitary kidney, acquired   . Fatigue   . Hyperparathyroidism, secondary   . Vitamin D deficiency disease   . Hyperparathyroidism   . H/O varicose veins 03/27/12  . Type II or unspecified type diabetes mellitus without mention of complication, not stated  as uncontrolled   . Diabetes mellitus type II   . Infections of kidney 03/27/12  . History of measles, mumps, or rubella 03/27/12  . Tubal pregnancy 12/23/76  . Unspecified essential hypertension     20 yrs ago  . Blood transfusion 1977    [facility]  . Complication of anesthesia 2010    hard to wake up after knee surgery  . Anemia   . Cardiomyopathy, nonischemic     EF 45 %  . Endometrial hyperplasia with atypia 05/05/12  . Diastolic HF (heart failure) 11/05/2012  . A-fib   . Echocardiogram abnormal 07/15/12    severe MR,mod to severe TR,mod to severe PA hypertension  . Pulmonary arterial hypertension   . PAF (paroxysmal atrial fibrillation), 1st episode since DCCV in Jan/14 03/19/2013  . Ulcers of both lower extremities, small, now with cellulitis 06/05/2013    Assessment: 58 YOF on chronic warfarin therapy for afib. INR supratherapeutic at admission.  She took her dose prior to presentation to ED   Home dose 5mg  daily with 2.5mg  MWF  Today;s INR = 3.78  CBC: Hgb = 10.2, Pltc WNL  Started on levofloxacin - can increase warfarin's effects  Goal of Therapy:  INR 2-3   Plan:   No warfarin tonight (has taken dose and INR elevated)  Daily INR  Rachel Bo  Elby Showers, PharmD, BCPS.   Pager: [phone]  07/23/2014,8:59 PM

## 2014-07-23 NOTE — H&P (Signed)
PATIENT DETAILS Name: [name] Age: 64 y.o. Sex: female Date of Birth: [birth year] Admit Date: 07/23/2014 MVH:QIONGE,XBMW J, MD   CHIEF COMPLAINT:  Sent home wound care center for bilateral lower extremity cellulitis  HPI: [name] is a 64 y.o. female with a Past Medical History of bilateral lower extremity lymphedema, chronic lower extremity wounds being followed by the wound care center, nonischemic cardiomyopathy with chronic systolic heart failure, chronic kidney disease stage III, diabetes, atrial fibrillation on chronic Coumadin therapy who presents today with the above noted complaint. Per the patient, for the past 3-4 days, she has had worsening of her lower extremity wounds. She claims that she blisters in her lower extremities very often, and that developed a blister at multiple spots in her bilateral lower legs on Tuesday, following which she noted that her legs were very erythematous. Some of the blisters have spontaneously drained, she went to the wound care center today, where she had a small I&D done on the left lower medial thigh, and was sent to the ED for further evaluation. I was asked to admit this patient for further evaluation and treatment. Patient denies any fever, denies any headache. She denies any worsening of her chronic shortness of breath. No nausea, vomiting or diarrhea as well.   ALLERGIES:   Allergies  Allergen Reactions  . Ivp Dye [Iodinated Diagnostic Agents] Shortness Of Breath    Turn red, can't breathe  . Betadine [Povidone Iodine]   . Diphenhydramine Hcl Swelling    In hands and eyes  . Fish Allergy Nausea And Vomiting  . Iohexol      Desc: PT TURNS RED AND WHEEZING   . Penicillins Other (See Comments)    Resp arrest as child  . Povidone-Iodine Other (See Comments)    Wheezing, turn red, can't breath, and blisters  . Promethazine Hcl Other (See Comments)    Low Blood Pressure  . Tape     Blisters, can use paper tape  for short periods  . Clindamycin Hives and Rash    wheezing  . Iodine Rash  . Morphine Sulfate Nausea And Vomiting and Rash    PAST MEDICAL HISTORY: Past Medical History  Diagnosis Date  . Personal history of other diseases of circulatory system   . 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 Chronic lymphocytic thyroiditis   . Morbid obesity   . Other chronic pulmonary heart diseases   . Obstructive sleep apnea (adult) (pediatric)   . Other and unspecified hyperlipidemia   . Fibromyalgia   . Goiter   . Thyroiditis, autoimmune   . Hypothyroidism, acquired, autoimmune   . Solitary kidney, acquired   . Fatigue   . Hyperparathyroidism, secondary   . Vitamin D deficiency disease   . Hyperparathyroidism   . H/O varicose veins 03/27/12  . Type II or unspecified type diabetes mellitus without mention of complication, not stated as uncontrolled   . Diabetes mellitus type II   . Infections of kidney 03/27/12  . History of measles, mumps, or rubella 03/27/12  . Tubal pregnancy 12/23/76  . Unspecified essential hypertension     20 yrs ago  . Blood transfusion 1977    [facility]  . Complication of anesthesia 2010    hard to wake up after knee surgery  . Anemia   . Cardiomyopathy, nonischemic     EF 45 %  . Endometrial hyperplasia with atypia 05/05/12  . Diastolic  HF (heart failure) 11/05/2012  . A-fib   . Echocardiogram abnormal 07/15/12    severe MR,mod to severe TR,mod to severe PA hypertension  . Pulmonary arterial hypertension   . PAF (paroxysmal atrial fibrillation), 1st episode since DCCV in Jan/14 03/19/2013  . Ulcers of both lower extremities, small, now with cellulitis 06/05/2013    PAST SURGICAL HISTORY: Past Surgical History  Procedure Laterality Date  . Cholecystectomy    . Pacemaker placement  02/26/11    Medtronic Adapta  . Knee surgery    . Resection duplicate left kidney    . 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 Hernia repair  1999     ruptured ;  . D& c with hysteroscopy & cervical polypectomy  05/05/12  . Cardioversion N/A 02/06/2013    Successful  . Iud removal  01/30/13  . Ectopic pregnancy surgery  1978  . Cardioversion N/A 06/08/2013    Procedure: CARDIOVERSION;  Surgeon: Sanda Klein, MD;  Location: [place];  Service: Cardiovascular;  Laterality: N/A;  Room [phone]  . Cardioversion N/A 02/01/2014    Procedure: CARDIOVERSION;  Surgeon: Sanda Klein, MD;  Location: [place];  Service: Cardiovascular;  Laterality: N/A;  . Cardioversion N/A 06/02/2014    Procedure: CARDIOVERSION;  Surgeon: Sanda Klein, MD;  Location: [facility];  Service: Cardiovascular;  Laterality: N/A;    MEDICATIONS AT HOME: Prior to Admission medications   Medication Sig Start Date End Date Taking? Authorizing Provider  albuterol (PROVENTIL HFA;VENTOLIN HFA) 108 (90 BASE) MCG/ACT inhaler Inhale 2 puffs into the lungs every 6 (six) hours as needed for wheezing or shortness of breath.    Yes Historical Provider, MD  albuterol (PROVENTIL) (2.5 MG/3ML) 0.083% nebulizer solution Take 2.5 mg by nebulization every 6 (six) hours as needed for wheezing or shortness of breath.    Yes Historical Provider, MD  ALPRAZolam Duanne Moron) 0.25 MG tablet Take 0.25 mg by mouth at bedtime as needed for anxiety. 01/28/14  Yes Mihai Croitoru, MD  amiodarone (PACERONE) 200 MG tablet Take 2 tablets (400 mg total) by mouth 2 (two) times daily. 07/08/14  Yes Mihai Croitoru, MD  calcitRIOL (ROCALTROL) 0.25 MCG capsule Take 0.25 mcg by mouth 2 (two) times daily.   Yes Historical Provider, MD  calcium citrate-vitamin D 200-200 MG-UNIT TABS Take 1 tablet by mouth 4 (four) times daily.    Yes Historical Provider, MD  colchicine 0.6 MG tablet Take 0.6 mg by mouth 2 (two) times daily. 04/26/14  Yes Historical Provider, MD  glucose blood (ONE TOUCH ULTRA TEST) test strip Test 3 times daily 03/09/14  Yes Sherrlyn Hock, MD  glyBURIDE (DIABETA) 5 MG tablet TAKE 1-2 TABLETS TWICE DAILY WITH A  MEAL. TAKE 10MG  (2 TABS) IN THE A.M. AND 5MG (1 TAB) AT NIGHT. 07/01/14  Yes Sherrlyn Hock, MD  HYDROcodone-acetaminophen [facility]) 10-325 MG per tablet Take 1 tablet by mouth every 6 (six) hours as needed for moderate pain. 07/15/13  Yes Elby Showers, MD  insulin glargine (LANTUS) 100 UNIT/ML injection Inject 28 Units into the skin at bedtime.  07/03/13  Yes Sherrlyn Hock, MD  Insulin Pen Needle (B-D UF III MINI PEN NEEDLES) 31G X 5 MM MISC Check blood sugar up to 6x daily 08/11/13  Yes Sherrlyn Hock, MD  iron polysaccharides (NIFEREX) 150 MG capsule Take 150 mg by mouth 2 (two) times daily.   Yes Historical Provider, MD  levothyroxine (SYNTHROID) 100 MCG tablet Take 1 tablet (100 mcg total)  by mouth daily before breakfast. 06/02/14  Yes Mihai Croitoru, MD  levothyroxine (SYNTHROID, LEVOTHROID) 125 MCG tablet Take 1 tablet (125 mcg total) by mouth daily before breakfast. 06/02/14  Yes Mihai Croitoru, MD  metolazone (ZAROXOLYN) 5 MG tablet Take 2.5 mg by mouth daily as needed (for weight gain). If up 4-5 lbs. 03/12/14  Yes Mihai Croitoru, MD  metoprolol tartrate (LOPRESSOR) 25 MG tablet Take 37.5 mg by mouth 2 (two) times daily. 10/08/13  Yes Mihai Croitoru, MD  Multiple Vitamin (MULTIVITAMIN) tablet Take 1 tablet by mouth daily.    Yes Historical Provider, MD  nitroGLYCERIN (NITRODUR - DOSED IN MG/24 HR) 0.4 mg/hr patch Place 1 patch (0.4 mg total) onto the skin every other day. 01/28/14  Yes Mihai Croitoru, MD  potassium chloride SA (K-DUR,KLOR-CON) 20 MEQ tablet Take 1 tablet (20 mEq total) by mouth 4 (four) times daily. 03/08/14  Yes Mihai Croitoru, MD  sitaGLIPtin (JANUVIA) 100 MG tablet Take 100 mg by mouth daily.   Yes Historical Provider, MD  torsemide (DEMADEX) 100 MG tablet Take 50-100 mg by mouth 2 (two) times daily. 100 mg in the am and 50 mg in the pm 03/08/14  Yes Mihai Croitoru, MD  ULORIC 40 MG tablet Take 80 mg by mouth daily. Take two tablet 05/21/14  Yes Historical Provider, MD  Vitamin D,  Ergocalciferol, (DRISDOL) 50000 UNITS CAPS capsule Take 50,000 Units by mouth 2 (two) times a week. Takes on Tuesdays and Fridays 03/09/14 03/10/15 Yes Sherrlyn Hock, MD  VOLTAREN 1 % GEL Apply 2 g topically 4 (four) times daily as needed (for pain).  03/11/14  Yes Historical Provider, MD  warfarin (COUMADIN) 5 MG tablet Take 2.5-5 mg by mouth daily. Takes 5 mg on Sunday, Tuesday, Thursday and Saturday and 2.5 mg all other days 01/20/14  Yes Tommy Medal, RPH-CPP  nitroGLYCERIN (NITROSTAT) 0.4 MG SL tablet Place 0.4 mg under the tongue every 5 (five) minutes as needed. For chest pain    Historical Provider, MD    FAMILY HISTORY: Family History  Problem Relation Age of Onset  . Cardiomyopathy Brother   . Diabetes Mother   . Cancer Maternal Grandmother   . Heart attack Mother     SOCIAL HISTORY:  reports that she has never smoked. She has never used smokeless tobacco. She reports that she does not drink alcohol or use illicit drugs.  REVIEW OF SYSTEMS:  Constitutional:   No  weight loss, night sweats,  Fevers, chills, fatigue.  HEENT:    No headaches, Difficulty swallowing,Tooth/dental problems,Sore throat,  No sneezing, itching, ear ache, nasal congestion, post nasal drip,   Cardio-vascular: No chest pain,  Orthopnea, PND,  dizziness, palpitations  GI:  No heartburn, indigestion, abdominal pain, nausea, vomiting, diarrhea, change in  bowel habits, loss of appetite  Resp: No shortness of breath at rest.  No cough,  No coughing up of blood.No change in color of mucus.No wheezing.No chest wall deformity  Skin:  no rash or lesions.  GU:  no dysuria, change in color of urine, no urgency or frequency.  No flank pain.  Musculoskeletal: No joint pain or swelling.  No decreased range of motion.  No back pain.  Psych: No change in mood or affect. No depression or anxiety.  No memory loss.   PHYSICAL EXAM: Blood pressure 99/55, pulse 72, temperature 98.3 F (36.8 C),  temperature source Oral, resp. rate 18, SpO2 99.00%.  General appearance :Awake, alert, not in any distress. Speech Clear. Not toxic  Looking HEENT: Atraumatic and Normocephalic, pupils equally reactive to light and accomodation Neck: supple, no JVD. No cervical lymphadenopathy.  Chest:Good air entry bilaterally, no added sounds  CVS: S1 S2 regular, no murmurs.  Abdomen: Bowel sounds present, Non tender and not distended with no gaurding, rigidity or rebound. Extremities: B/L Lower Ext shows chronic lower extremity edema, bilateral erythema, numerous small ulcerations. Open cavity in the left inner thigh in the lower aspect that is currently packed and not draining. Neurology: Awake alert, and oriented X 3, CN II-XII intact, Non focal Skin:No Rash Wounds:N/A  LABS ON ADMISSION:   Recent Labs  07/23/14 1803  NA 135*  K 3.1*  CL 91*  CO2 30  GLUCOSE 194*  BUN 75*  CREATININE 2.20*  CALCIUM 9.1   No results found for this basename: AST, ALT, ALKPHOS, BILITOT, PROT, ALBUMIN,  in the last 72 hours No results found for this basename: LIPASE, AMYLASE,  in the last 72 hours  Recent Labs  07/23/14 1803  WBC 5.7  NEUTROABS 4.9  HGB 10.2*  HCT 31.2*  MCV 96.0  PLT 250   No results found for this basename: CKTOTAL, CKMB, CKMBINDEX, TROPONINI,  in the last 72 hours No results found for this basename: DDIMER,  in the last 72 hours No components found with this basename: POCBNP,    RADIOLOGIC STUDIES ON ADMISSION: No results found.  ASSESSMENT AND PLAN: Present on Admission:  . Cellulitis and abscess of bilateral lower extremity  - Admit to a telemetry unit given multiple cardiac issues, start on empiric vancomycin and Levaquin. We'll get consultation. Keep lower extremity elevated. Follow clinical course- at this time do not see any further indurated area indicating an underlying abscess, monitor closely.  . Type 2 diabetes mellitus - Continue oral medications and Lantus, add  SSI while inpatient. Follow CBGs   . PAF (paroxysmal atrial fibrillation) - Currently with a paced rhythm, monitor in telemetry. Coumadin to be dosed by pharmacy.   . Cardiomyopathy, nonischemic- EF 30-35% June 2014 - Clinically compensated. Continue Demadex. Strict I&O's, daily weights.  . Hypothyroidism, acquired, autoimmune - Continue levothyroxine  . Hypocalcemia - Continue with calcium, vitamin D supplementation. Monitor lites periodically  . CKD (chronic kidney disease) stage 4, GFR 15-29 ml/min - Creatinine close to usual baseline. Monitor periodically.  Marland Kitchen PACEMAKER, PERMANENT - Monitor in telemetry.   . Obesity hypoventilation syndrome - CPAP each bedtime  . OBESITY, MORBID  Further plan will depend as patient's clinical course evolves and further radiologic and laboratory data become available. Patient will be monitored closely.   Above noted plan was discussed with patient/spouse, they were in agreement.   DVT Prophylaxis: On coumadin   Code Status: Full Code  Total time spent for admission equals 45 minutes.  [facility] Pager [phone]  If 7PM-7AM, please contact night-coverage [email or web address] Password [facility] 07/23/2014, 8:57 PM  **Disclaimer: This note may have been dictated with voice recognition software. Similar sounding words can inadvertently be transcribed and this note may contain transcription errors which may not have been corrected upon publication of note.**

## 2014-07-24 DIAGNOSIS — I4891 Unspecified atrial fibrillation: Secondary | ICD-10-CM

## 2014-07-24 DIAGNOSIS — M7989 Other specified soft tissue disorders: Secondary | ICD-10-CM

## 2014-07-24 DIAGNOSIS — I5042 Chronic combined systolic (congestive) and diastolic (congestive) heart failure: Secondary | ICD-10-CM

## 2014-07-24 LAB — COMPREHENSIVE METABOLIC PANEL
ALBUMIN: 2.5 g/dL — AB (ref 3.5–5.2)
ALT: 18 U/L (ref 0–35)
ANION GAP: 12 (ref 5–15)
AST: 19 U/L (ref 0–37)
Alkaline Phosphatase: 127 U/L — ABNORMAL HIGH (ref 39–117)
BILIRUBIN TOTAL: 1 mg/dL (ref 0.3–1.2)
BUN: 72 mg/dL — AB (ref 6–23)
CHLORIDE: 94 meq/L — AB (ref 96–112)
CO2: 31 mEq/L (ref 19–32)
CREATININE: 2.18 mg/dL — AB (ref 0.50–1.10)
Calcium: 8.8 mg/dL (ref 8.4–10.5)
GFR calc non Af Amer: 23 mL/min — ABNORMAL LOW (ref >90)
GFR, EST AFRICAN AMERICAN: 26 mL/min — AB (ref >90)
GLUCOSE: 134 mg/dL — AB (ref 70–99)
Potassium: 3.2 mEq/L — ABNORMAL LOW (ref 3.7–5.3)
Sodium: 137 mEq/L (ref 137–147)
Total Protein: 7.8 g/dL (ref 6.0–8.3)

## 2014-07-24 LAB — PROTIME-INR
INR: 3.75 — ABNORMAL HIGH (ref 0.00–1.49)
Prothrombin Time: 37.1 seconds — ABNORMAL HIGH (ref 11.6–15.2)

## 2014-07-24 LAB — CBC
HEMATOCRIT: 29.8 % — AB (ref 36.0–46.0)
HEMOGLOBIN: 9.6 g/dL — AB (ref 12.0–15.0)
MCH: 31.2 pg (ref 26.0–34.0)
MCHC: 32.2 g/dL (ref 30.0–36.0)
MCV: 96.8 fL (ref 78.0–100.0)
Platelets: 217 10*3/uL (ref 150–400)
RBC: 3.08 MIL/uL — ABNORMAL LOW (ref 3.87–5.11)
RDW: 18 % — AB (ref 11.5–15.5)
WBC: 7 10*3/uL (ref 4.0–10.5)

## 2014-07-24 LAB — GLUCOSE, CAPILLARY
GLUCOSE-CAPILLARY: 89 mg/dL (ref 70–99)
Glucose-Capillary: 196 mg/dL — ABNORMAL HIGH (ref 70–99)
Glucose-Capillary: 116 mg/dL — ABNORMAL HIGH (ref 70–99)
Glucose-Capillary: 146 mg/dL — ABNORMAL HIGH (ref 70–99)

## 2014-07-24 MED ORDER — HYDROMORPHONE HCL PF 1 MG/ML IJ SOLN
0.5000 mg | Freq: Once | INTRAMUSCULAR | Status: AC
  Administered 2014-07-24: 0.5 mg via INTRAVENOUS
  Filled 2014-07-24: qty 1

## 2014-07-24 MED ORDER — HYDROMORPHONE HCL PF 1 MG/ML IJ SOLN
0.5000 mg | INTRAMUSCULAR | Status: DC | PRN
  Administered 2014-07-24 – 2014-07-28 (×14): 0.5 mg via INTRAVENOUS
  Filled 2014-07-24 (×14): qty 1

## 2014-07-24 MED ORDER — MUPIROCIN 2 % EX OINT
TOPICAL_OINTMENT | Freq: Two times a day (BID) | CUTANEOUS | Status: DC
  Administered 2014-07-25 – 2014-07-28 (×8): via TOPICAL
  Administered 2014-07-29: 1 via TOPICAL
  Administered 2014-07-29 – 2014-07-31 (×3): via TOPICAL
  Administered 2014-08-01: 1 via TOPICAL
  Administered 2014-08-02 – 2014-08-03 (×3): via TOPICAL
  Filled 2014-07-24 (×2): qty 22

## 2014-07-24 MED ORDER — GABAPENTIN 100 MG PO CAPS
100.0000 mg | ORAL_CAPSULE | Freq: Two times a day (BID) | ORAL | Status: DC
  Administered 2014-07-24 – 2014-08-03 (×21): 100 mg via ORAL
  Filled 2014-07-24 (×22): qty 1

## 2014-07-24 MED ORDER — POTASSIUM CHLORIDE CRYS ER 20 MEQ PO TBCR
40.0000 meq | EXTENDED_RELEASE_TABLET | Freq: Once | ORAL | Status: AC
  Administered 2014-07-24: 40 meq via ORAL
  Filled 2014-07-24: qty 2

## 2014-07-24 MED ORDER — OXYCODONE-ACETAMINOPHEN 5-325 MG PO TABS
1.0000 | ORAL_TABLET | Freq: Three times a day (TID) | ORAL | Status: DC | PRN
  Administered 2014-07-24 – 2014-08-03 (×19): 1 via ORAL
  Filled 2014-07-24 (×19): qty 1

## 2014-07-24 MED ORDER — METOLAZONE 2.5 MG PO TABS
2.5000 mg | ORAL_TABLET | Freq: Once | ORAL | Status: AC
  Administered 2014-07-24: 2.5 mg via ORAL
  Filled 2014-07-24: qty 1

## 2014-07-24 NOTE — Progress Notes (Signed)
VASCULAR LAB PRELIMINARY  PRELIMINARY  PRELIMINARY  PRELIMINARY  Bilateral lower extremity venous duplex  completed.    Preliminary report:  Bilateral:  No obvious evidence of DVT, superficial thrombosis, or Baker's Cyst.    Aulden Calise, RVT 07/24/2014, 4:03 PM

## 2014-07-24 NOTE — Progress Notes (Addendum)
[facility] PROGRESS NOTE  [name] PXT:[id number] DOB: 1950-04-03 DOA: 07/23/2014 PCP: Elby Showers, MD  Assessment/Plan: 64 y.o. female with with PMH of IDDM, CHF, cardiomyopathy, Pulmonary HTN, PAF, s/p PPM on coumadin, CKD, Gout, severe lymphedema with chronic lower extremity wounds being followed by the wound care center,  who presents today with the leg ulcers;  -Pt developed a blister at multiple spots in her bilateral lower legs on Tuesday, following which she noted that her legs were very erythematous. Some of the blisters have spontaneously drained, she went to the wound care center today, where she had a small I&D done on the left lower medial thigh, and was sent to the ED for further evaluation  1. Cellulitis and abscess of bilateral lower extremity  -due to severe lymphedema with progressive draining ulcers;  -cont empiric vancomycin and Levaquin, obtain wound culture, wound care consult; Keep lower extremity elevated.  -cont diuresis add metolazone prn; since leg with severe edema; leg [place] r/o any anatomical obstruction in veins     2. Type 2 diabetes mellitus Ha1c-7.0 (02/2014) -cont Lantus, add SSI while inpatient. Follow CBGs; hold gliburide with CKD 3. PAF (paroxysmal atrial fibrillation)  -currently with a paced rhythm, monitor in telemetry. Coumadin to be dosed by pharmacy. Monitor INR due to interaction of atx+coumadin 4. Chronic CHF, systolic HF, with dilated Cardiomyopathy, nonischemic -echo (2014): LVEF 30-35%, Diffuse Hypokinesis, severe MR, dilated LA, RA, RA, LV, TR, pulmonary HTN;  -clinically compensated. Continue Demadex. Strict I&O's, daily weights.  5. Hypothyroidism, acquired, autoimmune  Continue levothyroxine  6. Hypocalcemia  Continue with calcium, vitamin D supplementation. Monitor lites periodically  7. CKD (chronic kidney disease) stage 4, GFR 15-29 ml/min  -creatinine close to usual baseline. Monitor periodically.  8. Obesity  hypoventilation syndrome CPAP each bedtime  9. OBESITY, MORBID; cont encourage weigh loss, diet exercise   10  Leg pain, neuropathy, add neurontin, cont prn opioids (tolerated opioids well no reaction)   Further plan will depend as patient's clinical course evolves and further radiologic and laboratory data become available. Patient will be monitored closely.   Code Status: full Family Communication:  D/w patient  (indicate person spoken with, relationship, and if by phone, the number) Disposition Plan: home pend clinical improvement    Consultants:  None   Procedures:  none  Antibiotics:  vanc 7/24<<<<  Levofloxacin 7/24<<<<<   (indicate start date, and stop date if known)  HPI/Subjective: alert  Objective: Filed Vitals:   07/24/14 0655  BP: 123/64  Pulse: 71  Temp: 98.7 F (37.1 C)  Resp: 24    Intake/Output Summary (Last 24 hours) at 07/24/14 1023 Last data filed at 07/24/14 0351  Gross per 24 hour  Intake    790 ml  Output   1025 ml  Net   -235 ml   Filed Weights   07/23/14 2105 07/24/14 0655  Weight: 147.9 kg (326 lb 1 oz) 148 kg (326 lb 4.5 oz)    Exam:   General:  alert  Cardiovascular: s1,s2 rrr  Respiratory: CTA BL  Abdomen: soft, obese, nt  Musculoskeletal: thigh ulcer draining, packed, erythema legs, cellulitis; BL sever lyphedema    Data Reviewed: Basic Metabolic Panel:  Recent Labs Lab 07/23/14 1803 07/24/14 0413  NA 135* 137  K 3.1* 3.2*  CL 91* 94*  CO2 30 31  GLUCOSE 194* 134*  BUN 75* 72*  CREATININE 2.20* 2.18*  CALCIUM 9.1 8.8   Liver Function Tests:  Recent Labs Lab 07/24/14 0413  AST 19  ALT 18  ALKPHOS 127*  BILITOT 1.0  PROT 7.8  ALBUMIN 2.5*   No results found for this basename: LIPASE, AMYLASE,  in the last 168 hours No results found for this basename: AMMONIA,  in the last 168 hours CBC:  Recent Labs Lab 07/23/14 1803 07/24/14 0413  WBC 5.7 7.0  NEUTROABS 4.9  --   HGB 10.2* 9.6*  HCT  31.2* 29.8*  MCV 96.0 96.8  PLT 250 217   Cardiac Enzymes: No results found for this basename: CKTOTAL, CKMB, CKMBINDEX, TROPONINI,  in the last 168 hours BNP (last 3 results) No results found for this basename: PROBNP,  in the last 8760 hours CBG:  Recent Labs Lab 07/23/14 2240 07/24/14 0743  GLUCAP 196* 116*    Recent Results (from the past 240 hour(s))  CULTURE, BLOOD (ROUTINE X 2)     Status: None   Collection Time    07/23/14  6:35 PM      Result Value Ref Range Status   Specimen Description BLOOD LEFT ANTECUBITAL   Final   Special Requests BOTTLES DRAWN AEROBIC AND ANAEROBIC 5ML   Final   Culture  Setup Time     Final   Value: 07/23/2014 22:16     Performed at [facility]   Culture     Final   Value:        BLOOD CULTURE RECEIVED NO GROWTH TO DATE CULTURE WILL BE HELD FOR 5 DAYS BEFORE ISSUING A FINAL NEGATIVE REPORT     Performed at [facility]   Report Status PENDING   Incomplete  CULTURE, BLOOD (ROUTINE X 2)     Status: None   Collection Time    07/23/14  6:38 PM      Result Value Ref Range Status   Specimen Description BLOOD RIGHT ANTECUBITAL   Final   Special Requests BOTTLES DRAWN AEROBIC AND ANAEROBIC 5ML   Final   Culture  Setup Time     Final   Value: 07/23/2014 22:16     Performed at [facility]   Culture     Final   Value:        BLOOD CULTURE RECEIVED NO GROWTH TO DATE CULTURE WILL BE HELD FOR 5 DAYS BEFORE ISSUING A FINAL NEGATIVE REPORT     Performed at [facility]   Report Status PENDING   Incomplete     Studies: No results found.  Scheduled Meds: . amiodarone  400 mg Oral BID  . calcitRIOL  0.25 mcg Oral BID  . calcium carbonate  1 tablet Oral QID   And  . cholecalciferol  200 Units Oral QID  . colchicine  0.6 mg Oral BID  . febuxostat  80 mg Oral Daily  . glyBURIDE  2.5 mg Oral BID WC  . insulin aspart  0-15 Units Subcutaneous TID WC  . insulin glargine  28 Units Subcutaneous QHS  . iron  polysaccharides  150 mg Oral BID  . levofloxacin (LEVAQUIN) IV  750 mg Intravenous Q48H  . levothyroxine  125 mcg Oral QAC breakfast  . linagliptin  5 mg Oral Daily  . metoprolol tartrate  37.5 mg Oral BID  . multivitamin with minerals  1 tablet Oral Daily  . nitroGLYCERIN  0.4 mg Transdermal QODAY  . potassium chloride SA  20 mEq Oral QID  . sodium chloride  3 mL Intravenous Q12H  . torsemide  100 mg Oral Q breakfast  . torsemide  50 mg Oral q1800  . vancomycin  2,000 mg Intravenous Q24H  . [START ON 07/26/2014] Vitamin D (Ergocalciferol)  50,000 Units Oral Once per day on Mon Thu  . Warfarin - Pharmacist Dosing Inpatient   Does not apply q1800   Continuous Infusions:   Active Problems:   OBESITY, MORBID   HYPERTENSION   PACEMAKER, PERMANENT   Obesity hypoventilation syndrome   Hypothyroidism, acquired, autoimmune   Solitary kidney, acquired   Cardiomyopathy, nonischemic- EF 30-35% June 2014   Type 2 diabetes mellitus   CKD (chronic kidney disease) stage 4, GFR 15-29 ml/min   PAF (paroxysmal atrial fibrillation)   Hypocalcemia   Cellulitis and abscess    Time spent: >35 minutes     Kinnie Feil  [facility] Pager [phone]. If 7PM-7AM, please contact night-coverage at [email or web address], password [facility] 07/24/2014, 10:23 AM  LOS: 1 day

## 2014-07-24 NOTE — Progress Notes (Signed)
Pt arrived to 4th floor via wheelchair and ambulated to chair with 1 assist. Husband and sister and bedside; no distress noted. VSS at this time. Will continue to monitor pt closely. [name] I

## 2014-07-24 NOTE — Progress Notes (Signed)
Lab called positive blood cultures in second anaerobic bottle. Gram negative rods. Results given to M. Donnal Debar, NP.  No new orders at this time. Continue with current plan of care.Will continue to monitor. Wendee Beavers [place]

## 2014-07-24 NOTE — Consult Note (Signed)
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  Patient with numerous comorbid conditions including cardiomyopathy, fibromyalgia, gout and lupus. Wound type:vascular with likely lymphedema underlying pathology.  Pressure Ulcer POA: No Measurement:largest and deepest ulcer on left medial thigh measures 3cm x 2cm x 2cm.  Pinhole lesions on the right lateral thigh and left medial ankle are weeping serous fluid.  Healing blisters are noted in a scattered pattern on both LEs. Wound TKP:TWSF medial thigh wound has a 100% black base.  Wound was debrided in the outpatient wound clinic on Friday and bled due to patient being on coumadin. Patient is agreeable to filling of the defect, but would liek to try a moisture retentive and antimicrobial filler, so I have provided mupirocin ointment impregnated gauze. Drainage (amount, consistency, odor) None from deepest defect, serous from right alteral thigh and left medial ankle. Periwound:erythematous, edematous Dressing procedure/placement/frequency:As noted above.  I will use dry dressing to teh right lateral thigh and left medial malleolus; will fill the defect on the left medial thigh with mupirocon ointment impregnated gauze.  I will provide a bariatric low air loss bed for comfort and for drying of LEs in addition aot pressure ulcer prevention. [place] nursing team will not follow, but will remain available to this patient, the nursing and medical team.  Please re-consult if needed. Thanks, Maudie Flakes, MSN, RN, [place], [place], [place] [phone])

## 2014-07-24 NOTE — Progress Notes (Signed)
ANTICOAGULATION CONSULT NOTE - Follow Up  Pharmacy Consult for warfarin Indication: afib   Allergies  Allergen Reactions  . Ivp Dye [Iodinated Diagnostic Agents] Shortness Of Breath    Turn red, can't breathe  . Betadine [Povidone Iodine]   . Diphenhydramine Hcl Swelling    In hands and eyes  . Fish Allergy Nausea And Vomiting  . Iohexol      Desc: PT TURNS RED AND WHEEZING   . Penicillins Other (See Comments)    Resp arrest as child  . Povidone-Iodine Other (See Comments)    Wheezing, turn red, can't breath, and blisters  . Promethazine Hcl Other (See Comments)    Low Blood Pressure  . Tape     Blisters, can use paper tape for short periods  . Clindamycin Hives and Rash    wheezing  . Iodine Rash  . Morphine Sulfate Nausea And Vomiting and Rash    Patient Measurements: Height: 5\' 6"  (167.6 cm) Weight: 326 lb 4.5 oz (148 kg) IBW/kg (Calculated) : 59.3 Vital Signs: Temp: 98.7 F (37.1 C) (07/25 0655) Temp src: Oral (07/25 0655) BP: 123/64 mmHg (07/25 0655) Pulse Rate: 71 (07/25 0655)  Labs:  Recent Labs  07/23/14 1803 07/24/14 0413  HGB 10.2* 9.6*  HCT 31.2* 29.8*  PLT 250 217  LABPROT 37.3* 37.1*  INR 3.78* 3.75*  CREATININE 2.20* 2.18*    Estimated Creatinine Clearance: 38.5 ml/min (by C-G formula based on Cr of 2.18).   Assessment: 56 YOF on chronic warfarin/amiodarone therapy for afib. INR supratherapeutic at admission.  She took her dose prior to presentation to ED   Home dose 5mg  daily with 2.5mg  MWF  Today's INR = 3.75  CBC: Hgb decreased slightly, Pltc WNL. No bleeding reported per chart notes.  Diet: heart healthy  Drug interactions:  Started on levofloxacin - can increase warfarin's effects  Chronic amiodarone - dosage increased in May outpatient, possible contribution to elevated INR as effects are typically seen weeks/months into therapy  Goal of Therapy:  INR 2-3   Plan:   No warfarin tonight   Daily INR  Peggyann Juba, PharmD, BCPS Pager: [phone] 07/24/2014,7:42 AM

## 2014-07-24 NOTE — Progress Notes (Signed)
Pt refused CPAP for tonight. Pt states she does fine wearing a nasal cannula at night. RT will continue to monitor as needed.

## 2014-07-24 NOTE — Progress Notes (Signed)
Lab called positive blood cultures. Anaerobic bottle with gram negative rods.  Jonette Eva, NP notified of results. No new orders at this time.  Will continue to monitor. Wendee Beavers Egan

## 2014-07-25 DIAGNOSIS — R7881 Bacteremia: Secondary | ICD-10-CM

## 2014-07-25 DIAGNOSIS — B9689 Other specified bacterial agents as the cause of diseases classified elsewhere: Secondary | ICD-10-CM

## 2014-07-25 LAB — BASIC METABOLIC PANEL
Anion gap: 14 (ref 5–15)
BUN: 70 mg/dL — ABNORMAL HIGH (ref 6–23)
CO2: 28 mEq/L (ref 19–32)
Calcium: 8.7 mg/dL (ref 8.4–10.5)
Chloride: 92 mEq/L — ABNORMAL LOW (ref 96–112)
Creatinine, Ser: 2.37 mg/dL — ABNORMAL HIGH (ref 0.50–1.10)
GFR calc non Af Amer: 20 mL/min — ABNORMAL LOW (ref >90)
GFR, EST AFRICAN AMERICAN: 24 mL/min — AB (ref >90)
GLUCOSE: 74 mg/dL (ref 70–99)
POTASSIUM: 3.6 meq/L — AB (ref 3.7–5.3)
Sodium: 134 mEq/L — ABNORMAL LOW (ref 137–147)

## 2014-07-25 LAB — CREATININE, SERUM
CREATININE: 2.54 mg/dL — AB (ref 0.50–1.10)
GFR calc Af Amer: 22 mL/min — ABNORMAL LOW (ref >90)
GFR, EST NON AFRICAN AMERICAN: 19 mL/min — AB (ref >90)

## 2014-07-25 LAB — GLUCOSE, CAPILLARY
GLUCOSE-CAPILLARY: 90 mg/dL (ref 70–99)
GLUCOSE-CAPILLARY: 85 mg/dL (ref 70–99)
GLUCOSE-CAPILLARY: 143 mg/dL — AB (ref 70–99)
Glucose-Capillary: 73 mg/dL (ref 70–99)
Glucose-Capillary: 142 mg/dL — ABNORMAL HIGH (ref 70–99)
Glucose-Capillary: 158 mg/dL — ABNORMAL HIGH (ref 70–99)

## 2014-07-25 LAB — VANCOMYCIN, RANDOM
Vancomycin Rm: 28.5 ug/mL
Vancomycin Rm: 21.7 ug/mL

## 2014-07-25 LAB — PROTIME-INR
INR: 2.9 — ABNORMAL HIGH (ref 0.00–1.49)
Prothrombin Time: 30.3 seconds — ABNORMAL HIGH (ref 11.6–15.2)

## 2014-07-25 MED ORDER — WARFARIN SODIUM 2.5 MG PO TABS
2.5000 mg | ORAL_TABLET | Freq: Once | ORAL | Status: AC
  Administered 2014-07-25: 2.5 mg via ORAL
  Filled 2014-07-25: qty 1

## 2014-07-25 MED ORDER — INSULIN GLARGINE 100 UNIT/ML ~~LOC~~ SOLN
20.0000 [IU] | Freq: Every day | SUBCUTANEOUS | Status: DC
  Administered 2014-07-25 – 2014-07-27 (×3): 20 [IU] via SUBCUTANEOUS
  Filled 2014-07-25 (×3): qty 0.2

## 2014-07-25 NOTE — Progress Notes (Signed)
ANTICOAGULATION CONSULT NOTE - Follow Up  Pharmacy Consult for warfarin Indication: afib   Allergies  Allergen Reactions  . Ivp Dye [Iodinated Diagnostic Agents] Shortness Of Breath    Turn red, can't breathe  . Betadine [Povidone Iodine]   . Diphenhydramine Hcl Swelling    In hands and eyes  . Fish Allergy Nausea And Vomiting  . Iohexol      Desc: PT TURNS RED AND WHEEZING   . Penicillins Other (See Comments)    Resp arrest as child  . Povidone-Iodine Other (See Comments)    Wheezing, turn red, can't breath, and blisters  . Promethazine Hcl Other (See Comments)    Low Blood Pressure  . Tape     Blisters, can use paper tape for short periods  . Clindamycin Hives and Rash    wheezing  . Iodine Rash  . Morphine Sulfate Nausea And Vomiting and Rash    Patient Measurements: Height: 5\' 6"  (167.6 cm) Weight: 328 lb 7.8 oz (149 kg) IBW/kg (Calculated) : 59.3 Vital Signs: Temp: 98.3 F (36.8 C) (07/26 0614) Temp src: Oral (07/26 0614) BP: 95/45 mmHg (07/26 0614) Pulse Rate: 73 (07/26 0614)  Labs:  Recent Labs  07/23/14 1803 07/24/14 0413 07/25/14 0500  HGB 10.2* 9.6*  --   HCT 31.2* 29.8*  --   PLT 250 217  --   LABPROT 37.3* 37.1* 30.3*  INR 3.78* 3.75* 2.90*  CREATININE 2.20* 2.18* 2.37*    Estimated Creatinine Clearance: 35.6 ml/min (by C-G formula based on Cr of 2.37).   Assessment: 49 YOF on chronic warfarin/amiodarone therapy for afib. INR supratherapeutic at admission.  She took her dose prior to presentation to ED   Home dose 5mg  daily with 2.5mg  MWF  Today's INR is now down into therapeutic range (2.9)  CBC: Hgb decreased slightly, Pltc WNL. No bleeding reported per chart notes.  BLE dopplers neg for DVT  Diet: heart healthy  Drug interactions:  Started on levofloxacin - can increase warfarin's effects  Chronic amiodarone - dosage increased in May outpatient, possible contribution to elevated INR as effects are typically seen weeks/months  into therapy  Goal of Therapy:  INR 2-3   Plan:   Warfarin 2.5mg  today at [facility]  Daily INR  Peggyann Juba, PharmD, BCPS Pager: [phone] 07/25/2014,7:52 AM

## 2014-07-25 NOTE — Progress Notes (Signed)
ANTIBIOTIC CONSULT NOTE - FOLLOW UP  Pharmacy Consult for Vancomycin / Levofloxacin Indication: Cellulitis  Allergies  Allergen Reactions  . Ivp Dye [Iodinated Diagnostic Agents] Shortness Of Breath    Turn red, can't breathe  . Betadine [Povidone Iodine]   . Diphenhydramine Hcl Swelling    In hands and eyes  . Fish Allergy Nausea And Vomiting  . Iohexol      Desc: PT TURNS RED AND WHEEZING   . Penicillins Other (See Comments)    Resp arrest as child  . Povidone-Iodine Other (See Comments)    Wheezing, turn red, can't breath, and blisters  . Promethazine Hcl Other (See Comments)    Low Blood Pressure  . Tape     Blisters, can use paper tape for short periods  . Clindamycin Hives and Rash    wheezing  . Iodine Rash  . Morphine Sulfate Nausea And Vomiting and Rash    Patient Measurements: Height: 5\' 6"  (167.6 cm) Weight: 328 lb 7.8 oz (149 kg) IBW/kg (Calculated) : 59.3  Vital Signs: Temp: 98.3 F (36.8 C) (07/26 6761) Temp src: Oral (07/26 0614) BP: 95/45 mmHg (07/26 0614) Pulse Rate: 73 (07/26 0614) Intake/Output from previous day: 07/25 0701 - 07/26 0700 In: 940 [P.O.:940] Out: 2200 [Urine:2200] Intake/Output from this shift:    Labs:  Recent Labs  07/23/14 1803 07/24/14 0413 07/25/14 0500  WBC 5.7 7.0  --   HGB 10.2* 9.6*  --   PLT 250 217  --   CREATININE 2.20* 2.18* 2.37*   Estimated Creatinine Clearance: 35.6 ml/min (by C-G formula based on Cr of 2.37).  26 ml/min/1.74m2 (normalized)   Recent Labs  07/25/14 0807  VANCORANDOM 28.5     Microbiology: Recent Results (from the past 720 hour(s))  CULTURE, BLOOD (ROUTINE X 2)     Status: None   Collection Time    07/23/14  6:35 PM      Result Value Ref Range Status   Specimen Description BLOOD LEFT ANTECUBITAL   Final   Special Requests BOTTLES DRAWN AEROBIC AND ANAEROBIC 5ML   Final   Culture  Setup Time     Final   Value: 07/23/2014 22:16     Performed at [facility]   Culture      Final   Value: GRAM NEGATIVE RODS     Note: Gram Stain Report Called to,Read Back By and Verified With: Rulon Abide RN on 07/24/14 at 20:45 by Rise Mu     Performed at [facility]   Report Status PENDING   Incomplete  CULTURE, BLOOD (ROUTINE X 2)     Status: None   Collection Time    07/23/14  6:38 PM      Result Value Ref Range Status   Specimen Description BLOOD RIGHT ANTECUBITAL   Final   Special Requests BOTTLES DRAWN AEROBIC AND ANAEROBIC 5ML   Final   Culture  Setup Time     Final   Value: 07/23/2014 22:16     Performed at [facility]   Culture     Final   Value: GRAM NEGATIVE RODS     Note: CRITICAL RESULT CALLED TO, READ BACK BY AND VERIFIED WITH: [place] RN @1015PM  Thelma Comp [id number]     Performed at [place]   Report Status PENDING   Incomplete  WOUND CULTURE     Status: None   Collection Time    07/24/14  3:44 PM      Result  Value Ref Range Status   Specimen Description LEG   Final   Special Requests Normal   Final   Gram Stain PENDING   Incomplete   Culture     Final   Value: Culture reincubated for better growth     Performed at [facility]   Report Status PENDING   Incomplete   Assessment: 35 YOF presents to ED with LE infection, per ED note she has multiple ulcerations and abscesses that have worsened over the last 1.5 weeks. SCr is elevated with h/o CRI.  7/24 >> vancomycin >> 7/24 >> levofloxacin >>  Tmax: afeb WBCs: WNL CKD4 (solitary kidney): SCr 2.37 (rising), CrCl 35 ml/min GC, 26 ml/min N  7/24 blood: 2 of 2 GNR 7/24 urine: ordered (UA neg) 7/25 leg wound (after abx started): reincubating  Drug level / dose changes info: 2g given 7/24 at 19:00 (SCr 2.2) 2g given 7/25 at 10:00 (SCr 2.18) Level 7/26 at 08:00 = 28.5. This is not at steady state, so further accumulation expected  Goal of Therapy:  Vancomycin trough level 15-20 mcg/ml Levaquin dose per renal function  Plan:   Hold vancomycin for  now  Recheck random level and SCr in 12hrs to assess clearance  Continue levaquin 750mg  IV q48h  Peggyann Juba, PharmD, BCPS Pager: [phone] 07/25/2014,9:17 AM

## 2014-07-25 NOTE — Progress Notes (Addendum)
[facility] PROGRESS NOTE  [name] DDU:[id number] DOB: [birth year] DOA: 07/23/2014 PCP: Elby Showers, MD  Assessment/Plan: 64 y.o. 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 Some of the blisters have spontaneously drained, she went to the wound care center today, where she had a small I&D done on the left lower medial thigh, and was sent to the ED for further evaluation  1. Cellulitis and abscess of bilateral lower extremity  -due to severe lymphedema with progressive draining ulcers;  -cont empiric vancomycin and Levaquin, pend wound culture, wound care consult; Keep lower extremity elevated.  -cont diuresis as needed; since leg with severe edema; leg [place]: no DVT  2. Bacteremia/sepsis; blood cultures + GNR pend sens; likely source leg wounds  -cotn IV atx, f/u cultures; monitor closely  2. Type 2 diabetes mellitus Ha1c-7.0 (02/2014) -decrease lantus 28--20 Units due to hypoglycemia; cont SSI; hold gliburide with CKD 3. PAF (paroxysmal atrial fibrillation)  -currently with a paced rhythm, monitor in telemetry. Coumadin to be dosed by pharmacy. Monitor INR due to interaction of atx+coumadin 4. Chronic CHF, systolic HF, with dilated Cardiomyopathy, nonischemic -echo (2014): LVEF 30-35%, Diffuse Hypokinesis, severe MR, dilated LA, RA, RA, LV, TR, pulmonary HTN;  -clinically compensated. Continue Demadex. Strict I&O's, daily weights.  5. Hypothyroidism, acquired, autoimmune  Continue levothyroxine  6. Hypocalcemia  Continue with calcium, vitamin D supplementation. Monitor lites periodically  7. CKD (chronic kidney disease) stage 4,  GFR 15-29 ml/min; h/o nephrectomy  -creatinine close to usual baseline. Monitor periodically.  8. Obesity hypoventilation syndrome CPAP each bedtime  9. OBESITY, MORBID; cont encourage weigh loss, diet exercise   10.  Leg pain, neuropathy, add neurontin, cont prn opioids (tolerated opioids well no reaction)  Prognosis is guarded;   Further plan will depend as patient's clinical course evolves and further radiologic and laboratory data become available. Patient will be monitored closely.   Code Status: full Family Communication:  D/w patient  (indicate person spoken with, relationship, and if by phone, the number) Disposition Plan: home pend clinical improvement    Consultants:  None   Procedures:  none  Antibiotics:  vanc 7/24<<<<  Levofloxacin 7/24<<<<<   (indicate start date, and stop date if known)  HPI/Subjective: alert  Objective: Filed Vitals:   07/25/14 0614  BP: 95/45  Pulse: 73  Temp: 98.3 F (36.8 C)  Resp: 20    Intake/Output Summary (Last 24 hours) at 07/25/14 1030 Last data filed at 07/25/14 0256  Gross per 24 hour  Intake    700 ml  Output   1925 ml  Net  -1225 ml   Filed Weights   07/23/14 2105 07/24/14 0655 07/25/14 0614  Weight: 147.9 kg (326 lb 1 oz) 148 kg (326 lb 4.5 oz) 149 kg (328 lb 7.8 oz)    Exam:   General:  alert  Cardiovascular: s1,s2 rrr  Respiratory: CTA BL  Abdomen: soft, obese, nt  Musculoskeletal: thigh ulcer draining, packed, erythema legs, cellulitis; BL sever lyphedema    Data Reviewed: Basic Metabolic Panel:  Recent Labs Lab 07/23/14 1803 07/24/14 0413 07/25/14 0500  NA 135* 137 134*  K 3.1* 3.2* 3.6*  CL  91* 94* 92*  CO2 30 31 28   GLUCOSE 194* 134* 74  BUN 75* 72* 70*  CREATININE 2.20* 2.18* 2.37*  CALCIUM 9.1 8.8 8.7   Liver Function Tests:  Recent Labs Lab 07/24/14 0413  AST 19  ALT 18  ALKPHOS 127*  BILITOT 1.0  PROT 7.8  ALBUMIN 2.5*   No results found for this basename:  LIPASE, AMYLASE,  in the last 168 hours No results found for this basename: AMMONIA,  in the last 168 hours CBC:  Recent Labs Lab 07/23/14 1803 07/24/14 0413  WBC 5.7 7.0  NEUTROABS 4.9  --   HGB 10.2* 9.6*  HCT 31.2* 29.8*  MCV 96.0 96.8  PLT 250 217   Cardiac Enzymes: No results found for this basename: CKTOTAL, CKMB, CKMBINDEX, TROPONINI,  in the last 168 hours BNP (last 3 results) No results found for this basename: PROBNP,  in the last 8760 hours CBG:  Recent Labs Lab 07/24/14 1144 07/24/14 1603 07/24/14 2128 07/25/14 0752 07/25/14 0812  GLUCAP 146* 89 90 73 85    Recent Results (from the past 240 hour(s))  CULTURE, BLOOD (ROUTINE X 2)     Status: None   Collection Time    07/23/14  6:35 PM      Result Value Ref Range Status   Specimen Description BLOOD LEFT ANTECUBITAL   Final   Special Requests BOTTLES DRAWN AEROBIC AND ANAEROBIC 5ML   Final   Culture  Setup Time     Final   Value: 07/23/2014 22:16     Performed at [facility]   Culture     Final   Value: GRAM NEGATIVE RODS     Note: Gram Stain Report Called to,Read Back By and Verified With: Rulon Abide RN on 07/24/14 at 20:45 by Rise Mu     Performed at [facility]   Report Status PENDING   Incomplete  CULTURE, BLOOD (ROUTINE X 2)     Status: None   Collection Time    07/23/14  6:38 PM      Result Value Ref Range Status   Specimen Description BLOOD RIGHT ANTECUBITAL   Final   Special Requests BOTTLES DRAWN AEROBIC AND ANAEROBIC 5ML   Final   Culture  Setup Time     Final   Value: 07/23/2014 22:16     Performed at [facility]   Culture     Final   Value: GRAM NEGATIVE RODS     Note: CRITICAL RESULT CALLED TO, READ BACK BY AND VERIFIED WITH: [place] RN @1015PM  Thelma Comp [id number]     Performed at [facility]   Report Status PENDING   Incomplete  WOUND CULTURE     Status: None   Collection Time    07/24/14  3:44 PM      Result Value Ref Range Status   Specimen  Description LEG   Final   Special Requests Normal   Final   Gram Stain PENDING   Incomplete   Culture     Final   Value: Culture reincubated for better growth     Performed at [facility]   Report Status PENDING   Incomplete     Studies: No results found.  Scheduled Meds: . amiodarone  400 mg Oral BID  . calcitRIOL  0.25 mcg Oral BID  . calcium carbonate  1 tablet Oral QID   And  . cholecalciferol  200 Units Oral QID  . colchicine  0.6  mg Oral BID  . febuxostat  80 mg Oral Daily  . gabapentin  100 mg Oral BID  . insulin aspart  0-15 Units Subcutaneous TID WC  . insulin glargine  28 Units Subcutaneous QHS  . iron polysaccharides  150 mg Oral BID  . levofloxacin (LEVAQUIN) IV  750 mg Intravenous Q48H  . levothyroxine  125 mcg Oral QAC breakfast  . linagliptin  5 mg Oral Daily  . metoprolol tartrate  37.5 mg Oral BID  . multivitamin with minerals  1 tablet Oral Daily  . mupirocin ointment   Topical BID  . nitroGLYCERIN  0.4 mg Transdermal QODAY  . potassium chloride SA  20 mEq Oral QID  . sodium chloride  3 mL Intravenous Q12H  . torsemide  100 mg Oral Q breakfast  . torsemide  50 mg Oral q1800  . [START ON 07/26/2014] Vitamin D (Ergocalciferol)  50,000 Units Oral Once per day on Mon Thu  . warfarin  2.5 mg Oral ONCE-1800  . Warfarin - Pharmacist Dosing Inpatient   Does not apply q1800   Continuous Infusions:   Active Problems:   OBESITY, MORBID   HYPERTENSION   PACEMAKER, PERMANENT   Obesity hypoventilation syndrome   Hypothyroidism, acquired, autoimmune   Solitary kidney, acquired   Cardiomyopathy, nonischemic- EF 30-35% June 2014   Type 2 diabetes mellitus   CKD (chronic kidney disease) stage 4, GFR 15-29 ml/min   PAF (paroxysmal atrial fibrillation)   Hypocalcemia   Cellulitis and abscess    Time spent: >35 minutes     Kinnie Feil  [facility] Pager [phone]. If 7PM-7AM, please contact night-coverage at [email or web address], password  [facility] 07/25/2014, 10:30 AM  LOS: 2 days

## 2014-07-26 LAB — CULTURE, BLOOD (ROUTINE X 2)

## 2014-07-26 LAB — BASIC METABOLIC PANEL
Anion gap: 13 (ref 5–15)
BUN: 79 mg/dL — AB (ref 6–23)
CALCIUM: 8.8 mg/dL (ref 8.4–10.5)
CO2: 29 mEq/L (ref 19–32)
Chloride: 93 mEq/L — ABNORMAL LOW (ref 96–112)
Creatinine, Ser: 2.68 mg/dL — ABNORMAL HIGH (ref 0.50–1.10)
GFR calc Af Amer: 20 mL/min — ABNORMAL LOW (ref >90)
GFR, EST NON AFRICAN AMERICAN: 18 mL/min — AB (ref >90)
GLUCOSE: 123 mg/dL — AB (ref 70–99)
POTASSIUM: 4.4 meq/L (ref 3.7–5.3)
Sodium: 135 mEq/L — ABNORMAL LOW (ref 137–147)

## 2014-07-26 LAB — CBC
HEMATOCRIT: 26.9 % — AB (ref 36.0–46.0)
HEMOGLOBIN: 8.6 g/dL — AB (ref 12.0–15.0)
MCH: 30.9 pg (ref 26.0–34.0)
MCHC: 32 g/dL (ref 30.0–36.0)
MCV: 96.8 fL (ref 78.0–100.0)
Platelets: 154 10*3/uL (ref 150–400)
RBC: 2.78 MIL/uL — ABNORMAL LOW (ref 3.87–5.11)
RDW: 18.4 % — ABNORMAL HIGH (ref 11.5–15.5)
WBC: 4.6 10*3/uL (ref 4.0–10.5)

## 2014-07-26 LAB — PROTIME-INR
INR: 2.42 — ABNORMAL HIGH (ref 0.00–1.49)
Prothrombin Time: 26.3 seconds — ABNORMAL HIGH (ref 11.6–15.2)

## 2014-07-26 LAB — IRON AND TIBC
Iron: 112 ug/dL (ref 42–135)
SATURATION RATIOS: 43 % (ref 20–55)
TIBC: 263 ug/dL (ref 250–470)
UIBC: 151 ug/dL (ref 125–400)

## 2014-07-26 LAB — GLUCOSE, CAPILLARY
GLUCOSE-CAPILLARY: 122 mg/dL — AB (ref 70–99)
Glucose-Capillary: 121 mg/dL — ABNORMAL HIGH (ref 70–99)
Glucose-Capillary: 128 mg/dL — ABNORMAL HIGH (ref 70–99)
Glucose-Capillary: 125 mg/dL — ABNORMAL HIGH (ref 70–99)

## 2014-07-26 LAB — FERRITIN: FERRITIN: 330 ng/mL — AB (ref 10–291)

## 2014-07-26 MED ORDER — VANCOMYCIN HCL 10 G IV SOLR
2000.0000 mg | INTRAVENOUS | Status: DC
  Administered 2014-07-26: 2000 mg via INTRAVENOUS
  Filled 2014-07-26: qty 2000

## 2014-07-26 MED ORDER — METRONIDAZOLE IN NACL 5-0.79 MG/ML-% IV SOLN
500.0000 mg | INTRAVENOUS | Status: AC
  Administered 2014-07-26: 500 mg via INTRAVENOUS
  Filled 2014-07-26: qty 100

## 2014-07-26 MED ORDER — WARFARIN SODIUM 2.5 MG PO TABS
2.5000 mg | ORAL_TABLET | Freq: Once | ORAL | Status: AC
  Administered 2014-07-26: 2.5 mg via ORAL
  Filled 2014-07-26: qty 1

## 2014-07-26 MED ORDER — METRONIDAZOLE IN NACL 5-0.79 MG/ML-% IV SOLN
500.0000 mg | Freq: Four times a day (QID) | INTRAVENOUS | Status: DC
  Administered 2014-07-27 (×2): 500 mg via INTRAVENOUS
  Filled 2014-07-26 (×3): qty 100

## 2014-07-26 NOTE — Progress Notes (Signed)
ANTICOAGULATION CONSULT NOTE - Follow Up  Pharmacy Consult for warfarin Indication: afib   Allergies  Allergen Reactions  . Ivp Dye [Iodinated Diagnostic Agents] Shortness Of Breath    Turn red, can't breathe  . Betadine [Povidone Iodine]   . Diphenhydramine Hcl Swelling    In hands and eyes  . Fish Allergy Nausea And Vomiting  . Iohexol      Desc: PT TURNS RED AND WHEEZING   . Penicillins Other (See Comments)    Resp arrest as child  . Povidone-Iodine Other (See Comments)    Wheezing, turn red, can't breath, and blisters  . Promethazine Hcl Other (See Comments)    Low Blood Pressure  . Tape     Blisters, can use paper tape for short periods  . Clindamycin Hives and Rash    wheezing  . Iodine Rash  . Morphine Sulfate Nausea And Vomiting and Rash    Patient Measurements: Height: 5\' 6"  (167.6 cm) Weight: 325 lb 13.4 oz (147.8 kg) IBW/kg (Calculated) : 59.3 Vital Signs: Temp: 97.9 F (Cynthia.6 C) (07/27 0417) Temp src: Oral (07/27 0417) BP: 94/63 mmHg (07/27 0417) Pulse Rate: 72 (07/27 0417)  Labs:  Recent Labs  07/23/14 1803 07/24/14 0413 07/25/14 0500 07/25/14 2152 07/26/14 0400  HGB 10.2* 9.6*  --   --  8.6*  HCT 31.2* 29.8*  --   --  26.9*  PLT 250 217  --   --  154  LABPROT 37.3* 37.1* 30.3*  --  26.3*  INR 3.78* 3.75* 2.90*  --  2.42*  CREATININE 2.20* 2.18* 2.37* 2.54* 2.68*    Estimated Creatinine Clearance: 31.3 ml/min (by C-G formula based on Cr of 2.68).   Assessment: [name] on chronic warfarin/amiodarone therapy for afib. INR supratherapeutic at admission.   Home dose 5mg  daily with 2.5mg  MWF  Today's INR is therapeutic (2.42)  CBC: Hgb decreased slightly, Pltc trending down. No bleeding reported per chart notes.  BLE dopplers neg for DVT  Diet: heart healthy, eating 100% of meals  Drug interactions:  Started on levofloxacin 7/24 - can increase warfarin's effects  Chronic amiodarone - dosage increased in May outpatient, possible  contribution to elevated INR as effects are typically seen weeks/months into therapy  Goal of Therapy:  INR 2-3   Plan:   Repeat War[facility]in 2.5mg  today at [facility]  Daily INR  Kizzie Furnish, PharmD Pager: [phone] 07/26/2014 7:47 AM

## 2014-07-26 NOTE — Progress Notes (Signed)
ANTIBIOTIC CONSULT NOTE - FOLLOW UP  Pharmacy Consult for Vancomycin Indication: multiple ulcerations and abscesses   Allergies  Allergen Reactions  . Ivp Dye [Iodinated Diagnostic Agents] Shortness Of Breath    Turn red, can't breathe  . Betadine [Povidone Iodine]   . Diphenhydramine Hcl Swelling    In hands and eyes  . Fish Allergy Nausea And Vomiting  . Iohexol      Desc: PT TURNS RED AND WHEEZING   . Penicillins Other (See Comments)    Resp arrest as child  . Povidone-Iodine Other (See Comments)    Wheezing, turn red, can't breath, and blisters  . Promethazine Hcl Other (See Comments)    Low Blood Pressure  . Tape     Blisters, can use paper tape for short periods  . Clindamycin Hives and Rash    wheezing  . Iodine Rash  . Morphine Sulfate Nausea And Vomiting and Rash    Patient Measurements: Height: 5\' 6"  (167.6 cm) Weight: 325 lb 13.4 oz (147.8 kg) IBW/kg (Calculated) : 59.3 Adjusted Body Weight:   Vital Signs: Temp: 97.9 F (36.6 C) (07/27 0417) Temp src: Oral (07/27 0417) BP: 94/63 mmHg (07/27 0417) Pulse Rate: 72 (07/27 0417) Intake/Output from previous day: 07/26 0701 - 07/27 0700 In: 480 [P.O.:480] Out: 1500 [Urine:1500] Intake/Output from this shift: Total I/O In: -  Out: 1150 [Urine:1150]  Labs:  Recent Labs  07/23/14 1803 07/24/14 0413 07/25/14 0500 07/25/14 2152 07/26/14 0400  WBC 5.7 7.0  --   --  4.6  HGB 10.2* 9.6*  --   --  8.6*  PLT 250 217  --   --  154  CREATININE 2.20* 2.18* 2.37* 2.54* 2.68*   Estimated Creatinine Clearance: 31.3 ml/min (by C-G formula based on Cr of 2.68).  Recent Labs  07/25/14 0807 07/25/14 2152  VANCORANDOM 28.5 21.7     Microbiology: Recent Results (from the past 720 hour(s))  CULTURE, BLOOD (ROUTINE X 2)     Status: None   Collection Time    07/23/14  6:35 PM      Result Value Ref Range Status   Specimen Description BLOOD LEFT ANTECUBITAL   Final   Special Requests BOTTLES DRAWN AEROBIC AND  ANAEROBIC 5ML   Final   Culture  Setup Time     Final   Value: 07/23/2014 22:16     Performed at [facility]   Culture     Final   Value: GRAM NEGATIVE RODS     Note: Gram Stain Report Called to,Read Back By and Verified With: Rulon Abide RN on 07/24/14 at 20:45 by Rise Mu     Performed at [facility]   Report Status PENDING   Incomplete  CULTURE, BLOOD (ROUTINE X 2)     Status: None   Collection Time    07/23/14  6:38 PM      Result Value Ref Range Status   Specimen Description BLOOD RIGHT ANTECUBITAL   Final   Special Requests BOTTLES DRAWN AEROBIC AND ANAEROBIC 5ML   Final   Culture  Setup Time     Final   Value: 07/23/2014 22:16     Performed at [facility]   Culture     Final   Value: GRAM NEGATIVE RODS     Note: CRITICAL RESULT CALLED TO, READ BACK BY AND VERIFIED WITH: [place] RN @1015PM  Thelma Comp [id number]     Performed at [facility]   Report Status PENDING  Incomplete  WOUND CULTURE     Status: None   Collection Time    07/24/14  3:44 PM      Result Value Ref Range Status   Specimen Description LEG   Final   Special Requests Normal   Final   Gram Stain PENDING   Incomplete   Culture     Final   Value: Culture reincubated for better growth     Performed at [facility]   Report Status PENDING   Incomplete    Anti-infectives   Start     Dose/Rate Route Frequency Ordered Stop   07/26/14 1000  vancomycin (VANCOCIN) 2,000 mg in sodium chloride 0.9 % 500 mL IVPB     2,000 mg 250 mL/hr over 120 Minutes Intravenous Every 48 hours 07/26/14 0445     07/24/14 1000  vancomycin (VANCOCIN) 2,000 mg in sodium chloride 0.9 % 500 mL IVPB  Status:  Discontinued     2,000 mg 250 mL/hr over 120 Minutes Intravenous Every 24 hours 07/23/14 1914 07/25/14 0914   07/23/14 2200  levofloxacin (LEVAQUIN) IVPB 750 mg     750 mg 100 mL/hr over 90 Minutes Intravenous Every 48 hours 07/23/14 2059     07/23/14 1930  vancomycin (VANCOCIN) 2,000 mg in  sodium chloride 0.9 % 500 mL IVPB     2,000 mg 250 mL/hr over 120 Minutes Intravenous NOW 07/23/14 1842 07/23/14 2111      Assessment: Patient with vancomycin level still above goal.    Goal of Therapy:  Vancomycin trough level 15-20 mcg/ml  Plan:  Measure antibiotic drug levels at steady state Follow up culture results Will dose vancomycin 2gm iv q48hr, next dose 7/27 at [facility] [place] 07/26/2014,4:46 AM

## 2014-07-26 NOTE — Progress Notes (Addendum)
Pt continues to refuse cpap.  Pt stated she is fine with 2lnc at night and did not want/need a cpap machine in her room.  Pt was advised that RT is available all night should she change her mind.  RN aware.

## 2014-07-26 NOTE — Care Management Note (Addendum)
    Page 1 of 2   08/03/2014     1:05:18 PM CARE MANAGEMENT NOTE 08/03/2014  Patient:  [name],[name]   Account Number:  [id number]  Date Initiated:  07/26/2014  Documentation initiated by:  Gi Or Norman  Subjective/Objective Assessment:   64 Y/O F ADMITTED W/BILAT LWR LEG CELLULITIS.     Action/Plan:   FROM HOME W/SPOUSE.   Anticipated DC Date:  08/03/2014   Anticipated DC Plan:  [place]  CM consult      Choice offered to / List presented to:  C-1 Patient           Status of service:  Completed, signed off Medicare Important Message given?  YES (If response is "NO", the following Medicare IM given date fields will be blank) Date Medicare IM given:  07/26/2014 Medicare IM given by:  [facility] Date Additional Medicare IM given:  08/02/2014 Additional Medicare IM given by:  [facility]  Discharge Disposition:  [place]  Per UR Regulation:  Reviewed for med. necessity/level of care/duration of stay  If discussed at [place] of Stay Meetings, dates discussed:   07/29/2014  08/03/2014    Comments:  08/03/14 Donni Oglesby RN,BSN NCM [phone] D/C SNF.  08/02/14 Naveh Rickles RN,BSN NCM [phone] [facility] FOLLOWING FOR HHC-HHRN-DISEASE MGMNT, DSG CHANGES/PT.PATIENT NOW IN AGREEMENT TO SNF-[facility] FOLLOWING UP.CURRENTLY-HYPDROTHERAPY,IV FLAGYL Q8 DAY 7/14,WOUND CARE-DSG CHANGES BID.PER NOTES WILL NOT D/C ON LONG TERM IV ABX.  07/30/14 Jaicob Dia RN,BSN NCM [phone] SX FOLLOWING-POD#1 I&D,TUNNELING L INNER THIGH,MAY NEED OR DEBRIDEMENT,& ?WOUND VAC.PT-HYDROTHERAPY R&L LEG.ID-IV ABX LONG TERM.NOTED NO PICC-RENAL FXN ISSUES.HAS PERMANENT PACEMAKER.PER INSURANCE-NO LTAC BENEFITS/NO OUT OF NETWORK BENEFIT FOR LTAC/NOT CRITICAL ENOUGH FOR LTAC-NOT ON VENT/TRACH.WOULD BENEFIT FROM SNF ONCE OFF HYDROTHERAPY,IF JUST WOUND VAC/IV ABX.PATIENT HAS STATED SHE PREFERS NOT TO GO TO SNF.IF HOME [facility] MUST CHECK FIRST IF THEY CAN  ACCESS A PERIPHERAL IV(VENOUS ACCESS), BEFORE THEY ACCEPT THE CASE.IF HOME WILL BE A HIGH RISK FOR READMIT/INFECTION.  07/29/14 Bubba Vanbenschoten RN,BSN [phone] 4P-ID-LONG TERM IV ABX,?MIDLINE.TC [facility] KRISTEN AWARE OF IV ABX.WILL NEED HHRN-IV ABX,IF LABS NEEDED CAN ARRANGE,PHARMACY PER PROTOCAL.S/P-I&D TODAY.  INR SUBTHERAPEUTIC INCREASE COUMADIN,HYPOTENSION,CT KIDNEY.BILAT LLE WOUNDS-SX EVAL-HYDROTHERAPY,WOC CONS-DSG CHANGES.ID FOLLOWING-IV ABX.[facility] FOLLOWING-HHRN RECOMMENDED.  07/28/14 Chayah Mckee RN,BSN NCM [phone] [facility] USED IN PAST,WILL USE AGAIN IF NEEDED.PATIENT DECLINES PICC,SURGICAL INTERVENTION, OR SNF.GOES TO [place].WOULD RECOMMEND HHRN-DISEASE MGMNT.TC [facility] KRISTEN AWARE OF REFERRAL.  07/27/14 Artemus Romanoff 07/26/14 Kayton Ripp RN,BSN NCM [phone] RECOMMEND HHRN-WOUND CARE.PROVIDED W/ [facility]HC AGENCY LIST.WILL AWAIT CHOICE.

## 2014-07-26 NOTE — Progress Notes (Signed)
[facility] PROGRESS NOTE  [name] WUJ:[id number] DOB: 1950-02-02 DOA: 07/23/2014 PCP: Elby Showers, MD  Assessment/Plan: 64 y.o. 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 Some of the blisters have spontaneously drained, she went to the wound care center today, where she had a small I&D done on the left lower medial thigh, and was sent to the ED for further evaluation  1. Cellulitis and abscess of bilateral lower extremity due to severe lymphedema with progressive draining ulcers;  -cont empiric vancomycin and Levaquin, pend wound culture, wound care consulted; Keep lower extremity elevated.  -cont diuresis as needed; since leg with severe edema; leg [place]: no DVT  2. Bacteremia/sepsis;  -7/24: blood cultures + GNR pend sens; likely source leg wounds  -cotn IV atx, f/u cultures; monitor closely;    2. Type 2 diabetes mellitus Ha1c-7.0 (02/2014) -decrease lantus 28--20 Units due to hypoglycemia; cont SSI; hold gliburide with CKD 3. PAF (paroxysmal atrial fibrillation)  -currently with a paced rhythm, monitor in telemetry. Coumadin to be dosed by pharmacy. Monitor INR due to interaction of atx+coumadin 4. Chronic CHF, systolic HF, with dilated Cardiomyopathy, nonischemic -echo (2014): LVEF 30-35%, Diffuse Hypokinesis, severe MR, dilated LA, RA, RA, LV, TR, pulmonary HTN;  -clinically compensated. Continue Demadex. Strict I&O's, daily weights.  5. Hypothyroidism, acquired, autoimmune  Continue levothyroxine  6. Hypocalcemia  Continue with calcium, vitamin D supplementation. Monitor lites periodically  7. CKD (chronic kidney  disease) stage 4, GFR 15-29 ml/min; h/o nephrectomy  -creatinine close to usual baseline. Monitor closely with diuretics  8. Obesity hypoventilation syndrome CPAP each bedtime  9. OBESITY, MORBID; cont encourage weigh loss, diet exercise   10.  Leg pain, neuropathy, add neurontin, cont prn opioids (tolerated opioids well no reaction) 11. H/o IDA: no acute s/s of bleeding; h/o MGUS -check iron profile; monitor;   Prognosis is guarded;   Further plan will depend as patient's clinical course evolves and further radiologic and laboratory data become available. Patient will be monitored closely.   Code Status: full Family Communication:  D/w patient  (indicate person spoken with, relationship, and if by phone, the number) Disposition Plan: home pend clinical improvement    Consultants:  None   Procedures:  none  Antibiotics:  vanc 7/24<<<<  Levofloxacin 7/24<<<<<   (indicate start date, and stop date if known)  HPI/Subjective: alert  Objective: Filed Vitals:   07/26/14 1046  BP: 96/52  Pulse: 69  Temp:   Resp:     Intake/Output Summary (Last 24 hours) at 07/26/14 1055 Last data filed at 07/26/14 0419  Gross per 24 hour  Intake    240 ml  Output   1150 ml  Net   -910 ml   Filed Weights   07/24/14 0655 07/25/14 0614 07/26/14 0417  Weight: 148 kg (326 lb 4.5 oz) 149 kg (328 lb 7.8 oz) 147.8 kg (325 lb 13.4 oz)    Exam:   General:  alert  Cardiovascular: s1,s2 rrr  Respiratory: CTA BL  Abdomen: soft, obese, nt  Musculoskeletal: thigh ulcer draining, packed, erythema legs, cellulitis; BL sever lyphedema    Data Reviewed: Basic Metabolic Panel:  Recent Labs Lab  07/23/14 1803 07/24/14 0413 07/25/14 0500 07/25/14 2152 07/26/14 0400  NA 135* 137 134*  --  135*  K 3.1* 3.2* 3.6*  --  4.4  CL 91* 94* 92*  --  93*  CO2 30 31 28   --  29  GLUCOSE 194* 134* 74  --  123*  BUN 75* 72* 70*  --  79*  CREATININE 2.20* 2.18* 2.37* 2.54* 2.68*  CALCIUM 9.1  8.8 8.7  --  8.8   Liver Function Tests:  Recent Labs Lab 07/24/14 0413  AST 19  ALT 18  ALKPHOS 127*  BILITOT 1.0  PROT 7.8  ALBUMIN 2.5*   No results found for this basename: LIPASE, AMYLASE,  in the last 168 hours No results found for this basename: AMMONIA,  in the last 168 hours CBC:  Recent Labs Lab 07/23/14 1803 07/24/14 0413 07/26/14 0400  WBC 5.7 7.0 4.6  NEUTROABS 4.9  --   --   HGB 10.2* 9.6* 8.6*  HCT 31.2* 29.8* 26.9*  MCV 96.0 96.8 96.8  PLT 250 217 154   Cardiac Enzymes: No results found for this basename: CKTOTAL, CKMB, CKMBINDEX, TROPONINI,  in the last 168 hours BNP (last 3 results) No results found for this basename: PROBNP,  in the last 8760 hours CBG:  Recent Labs Lab 07/25/14 0812 07/25/14 1222 07/25/14 1708 07/25/14 2031 07/26/14 0754  GLUCAP 85 142* 158* 143* 121*    Recent Results (from the past 240 hour(s))  CULTURE, BLOOD (ROUTINE X 2)     Status: None   Collection Time    07/23/14  6:35 PM      Result Value Ref Range Status   Specimen Description BLOOD LEFT ANTECUBITAL   Final   Special Requests BOTTLES DRAWN AEROBIC AND ANAEROBIC 5ML   Final   Culture  Setup Time     Final   Value: 07/23/2014 22:16     Performed at [facility]   Culture     Final   Value: GRAM NEGATIVE RODS     Note: Gram Stain Report Called to,Read Back By and Verified With: Rulon Abide RN on 07/24/14 at 20:45 by Rise Mu     Performed at [facility]   Report Status PENDING   Incomplete  CULTURE, BLOOD (ROUTINE X 2)     Status: None   Collection Time    07/23/14  6:38 PM      Result Value Ref Range Status   Specimen Description BLOOD RIGHT ANTECUBITAL   Final   Special Requests BOTTLES DRAWN AEROBIC AND ANAEROBIC 5ML   Final   Culture  Setup Time     Final   Value: 07/23/2014 22:16     Performed at [place]     Final   Value: GRAM NEGATIVE RODS     Note: CRITICAL RESULT CALLED TO, READ BACK BY AND VERIFIED  WITH: [place] RN @1015PM  Thelma Comp [id number]     Performed at [facility]   Report Status PENDING   Incomplete  WOUND CULTURE     Status: None   Collection Time    07/24/14  3:44 PM      Result Value Ref Range Status   Specimen Description LEG   Final   Special Requests Normal   Final   Gram Stain PENDING   Incomplete   Culture     Final   Value: Culture reincubated for better growth     Performed at  [facility]   Report Status PENDING   Incomplete     Studies: No results found.  Scheduled Meds: . amiodarone  400 mg Oral BID  . calcitRIOL  0.25 mcg Oral BID  . calcium carbonate  1 tablet Oral QID   And  . cholecalciferol  200 Units Oral QID  . colchicine  0.6 mg Oral BID  . febuxostat  80 mg Oral Daily  . gabapentin  100 mg Oral BID  . insulin aspart  0-15 Units Subcutaneous TID WC  . insulin glargine  20 Units Subcutaneous QHS  . iron polysaccharides  150 mg Oral BID  . levofloxacin (LEVAQUIN) IV  750 mg Intravenous Q48H  . levothyroxine  125 mcg Oral QAC breakfast  . linagliptin  5 mg Oral Daily  . metoprolol tartrate  37.5 mg Oral BID  . multivitamin with minerals  1 tablet Oral Daily  . mupirocin ointment   Topical BID  . nitroGLYCERIN  0.4 mg Transdermal QODAY  . potassium chloride SA  20 mEq Oral QID  . sodium chloride  3 mL Intravenous Q12H  . torsemide  100 mg Oral Q breakfast  . torsemide  50 mg Oral q1800  . vancomycin  2,000 mg Intravenous Q48H  . Vitamin D (Ergocalciferol)  50,000 Units Oral Once per day on Mon Thu  . warfarin  2.5 mg Oral ONCE-1800  . Warfarin - Pharmacist Dosing Inpatient   Does not apply q1800   Continuous Infusions:   Active Problems:   OBESITY, MORBID   HYPERTENSION   PACEMAKER, PERMANENT   Obesity hypoventilation syndrome   Hypothyroidism, acquired, autoimmune   Solitary kidney, acquired   Cardiomyopathy, nonischemic- EF 30-35% June 2014   Type 2 diabetes mellitus   CKD (chronic kidney disease) stage 4, GFR  15-29 ml/min   PAF (paroxysmal atrial fibrillation)   Hypocalcemia   Cellulitis and abscess    Time spent: >35 minutes     Kinnie Feil  [facility] Pager [phone]. If 7PM-7AM, please contact night-coverage at [email or web address], password [facility] 07/26/2014, 10:55 AM  LOS: 3 days

## 2014-07-27 ENCOUNTER — Inpatient Hospital Stay (HOSPITAL_COMMUNITY): Payer: Medicare Other

## 2014-07-27 DIAGNOSIS — E119 Type 2 diabetes mellitus without complications: Secondary | ICD-10-CM

## 2014-07-27 DIAGNOSIS — L03119 Cellulitis of unspecified part of limb: Principal | ICD-10-CM

## 2014-07-27 DIAGNOSIS — L97909 Non-pressure chronic ulcer of unspecified part of unspecified lower leg with unspecified severity: Secondary | ICD-10-CM

## 2014-07-27 DIAGNOSIS — B966 Bacteroides fragilis [B. fragilis] as the cause of diseases classified elsewhere: Secondary | ICD-10-CM

## 2014-07-27 DIAGNOSIS — L02419 Cutaneous abscess of limb, unspecified: Principal | ICD-10-CM

## 2014-07-27 DIAGNOSIS — N189 Chronic kidney disease, unspecified: Secondary | ICD-10-CM

## 2014-07-27 DIAGNOSIS — E038 Other specified hypothyroidism: Secondary | ICD-10-CM

## 2014-07-27 DIAGNOSIS — I509 Heart failure, unspecified: Secondary | ICD-10-CM

## 2014-07-27 DIAGNOSIS — D472 Monoclonal gammopathy: Secondary | ICD-10-CM

## 2014-07-27 LAB — BASIC METABOLIC PANEL
Anion gap: 14 (ref 5–15)
BUN: 87 mg/dL — ABNORMAL HIGH (ref 6–23)
CALCIUM: 9 mg/dL (ref 8.4–10.5)
CO2: 27 mEq/L (ref 19–32)
Chloride: 94 mEq/L — ABNORMAL LOW (ref 96–112)
Creatinine, Ser: 2.72 mg/dL — ABNORMAL HIGH (ref 0.50–1.10)
GFR calc Af Amer: 20 mL/min — ABNORMAL LOW (ref >90)
GFR calc non Af Amer: 17 mL/min — ABNORMAL LOW (ref >90)
GLUCOSE: 131 mg/dL — AB (ref 70–99)
POTASSIUM: 4 meq/L (ref 3.7–5.3)
Sodium: 135 mEq/L — ABNORMAL LOW (ref 137–147)

## 2014-07-27 LAB — CBC
HCT: 26.9 % — ABNORMAL LOW (ref 36.0–46.0)
Hemoglobin: 8.5 g/dL — ABNORMAL LOW (ref 12.0–15.0)
MCH: 30.6 pg (ref 26.0–34.0)
MCHC: 31.6 g/dL (ref 30.0–36.0)
MCV: 96.8 fL (ref 78.0–100.0)
Platelets: 171 10*3/uL (ref 150–400)
RBC: 2.78 MIL/uL — ABNORMAL LOW (ref 3.87–5.11)
RDW: 18.2 % — ABNORMAL HIGH (ref 11.5–15.5)
WBC: 4.8 10*3/uL (ref 4.0–10.5)

## 2014-07-27 LAB — GLUCOSE, CAPILLARY
Glucose-Capillary: 144 mg/dL — ABNORMAL HIGH (ref 70–99)
Glucose-Capillary: 147 mg/dL — ABNORMAL HIGH (ref 70–99)
Glucose-Capillary: 158 mg/dL — ABNORMAL HIGH (ref 70–99)

## 2014-07-27 LAB — PROTIME-INR
INR: 1.96 — ABNORMAL HIGH (ref 0.00–1.49)
Prothrombin Time: 22.3 seconds — ABNORMAL HIGH (ref 11.6–15.2)

## 2014-07-27 MED ORDER — SODIUM CHLORIDE 0.9 % IV SOLN
250.0000 mg | Freq: Four times a day (QID) | INTRAVENOUS | Status: DC
  Administered 2014-07-27 – 2014-08-01 (×22): 250 mg via INTRAVENOUS
  Filled 2014-07-27 (×24): qty 250

## 2014-07-27 NOTE — Progress Notes (Signed)
ANTIBIOTIC CONSULT NOTE - FOLLOW UP  Pharmacy Consult for Primaxin Indication: Cellulitis/Bactermia  Allergies  Allergen Reactions  . Ivp Dye [Iodinated Diagnostic Agents] Shortness Of Breath    Turn red, can't breathe  . Betadine [Povidone Iodine]   . Diphenhydramine Hcl Swelling    In hands and eyes  . Fish Allergy Nausea And Vomiting  . Iohexol      Desc: PT TURNS RED AND WHEEZING   . Penicillins Other (See Comments)    Resp arrest as child  . Povidone-Iodine Other (See Comments)    Wheezing, turn red, can't breath, and blisters  . Promethazine Hcl Other (See Comments)    Low Blood Pressure  . Tape     Blisters, can use paper tape for short periods  . Clindamycin Hives and Rash    wheezing  . Iodine Rash  . Morphine Sulfate Nausea And Vomiting and Rash    Patient Measurements: Height: 5\' 6"  (167.6 cm) Weight: 329 lb 12.9 oz (149.6 kg) IBW/kg (Calculated) : 59.3  Vital Signs: Temp: 98 F (36.7 C) (07/28 0524) Temp src: Oral (07/28 0524) BP: 112/67 mmHg (07/28 0524) Pulse Rate: 69 (07/28 0524) Intake/Output from previous day: 07/27 0701 - 07/28 0700 In: 480 [P.O.:480] Out: 1800 [Urine:1800] Intake/Output from this shift:    Labs:  Recent Labs  07/25/14 2152 07/26/14 0400 07/27/14 0438  WBC  --  4.6 4.8  HGB  --  8.6* 8.5*  PLT  --  154 171  CREATININE 2.54* 2.68* 2.72*   Estimated Creatinine Clearance: 31.1 ml/min (by C-G formula based on Cr of 2.72).  26 ml/min/1.24m2 (normalized)   Recent Labs  07/25/14 0807 07/25/14 2152  VANCORANDOM 28.5 21.7     Microbiology: Recent Results (from the past 720 hour(s))  CULTURE, BLOOD (ROUTINE X 2)     Status: None   Collection Time    07/23/14  6:35 PM      Result Value Ref Range Status   Specimen Description BLOOD LEFT ANTECUBITAL   Final   Special Requests BOTTLES DRAWN AEROBIC AND ANAEROBIC 5ML   Final   Culture  Setup Time     Final   Value: 07/23/2014 22:16     Performed at [facility]   Culture     Final   Value: BACTEROIDES FRAGILIS     Note: BETA LACTAMASE POSITIVE     Note: Gram Stain Report Called to,Read Back By and Verified With: Rulon Abide RN on 07/24/14 at 20:45 by Rise Mu     Performed at [facility]   Report Status 07/26/2014 FINAL   Final  CULTURE, BLOOD (ROUTINE X 2)     Status: None   Collection Time    07/23/14  6:38 PM      Result Value Ref Range Status   Specimen Description BLOOD RIGHT ANTECUBITAL   Final   Special Requests BOTTLES DRAWN AEROBIC AND ANAEROBIC 5ML   Final   Culture  Setup Time     Final   Value: 07/23/2014 22:16     Performed at [facility]   Culture     Final   Value: BACTEROIDES FRAGILIS     Note: CRITICAL RESULT CALLED TO, READ BACK BY AND VERIFIED WITH: [place] RN @1015PM  VINCJ [id number] BETA LACTAMASE POSITIVE     Performed at [facility]   Report Status 07/26/2014 FINAL   Final  WOUND CULTURE     Status: None   Collection Time  07/24/14  3:44 PM      Result Value Ref Range Status   Specimen Description LEG   Final   Special Requests Normal   Final   Gram Stain PENDING   Incomplete   Culture     Final   Value: Culture reincubated for better growth     Performed at [facility]   Report Status PENDING   Incomplete   Assessment: 64 y.o. 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 Patient was placed on empiric vanco and levaquin upon admission. 7/24 blood cultures positive for Bacteroides fragilis, pharmacy consulted to dose Primaxin.  7/24 >> vancomycin >> 7/28 7/24 >> levofloxacin >> 7/28 7/27 >> flagyl (MD)>> 7/28 >> primaxin >>  Tmax: afeb WBCs: WNL CKD4 (solitary kidney): SCr 2.72 (rising), CrCl  23N  7/24 blood: bacteroides fragilis (beta-lactamase positive) 7/24 urine: ordered (UA neg) 7/25 leg wound (after abx started): reincubating  Goal of Therapy:  Appropriate antibiotic dosing for renal function; eradication of infection  Plan:   Start primaxin 250mg  IV Q6H  Follow renal function  F/u on wound culture  Kizzie Furnish, PharmD Pager: [phone] 07/27/2014 11:11 AM

## 2014-07-27 NOTE — Progress Notes (Signed)
Upon return form CT scan, dressings were not intact. Dressing changed at 1630 as per MD order. Dressings are currently clean, dry, and intact.

## 2014-07-27 NOTE — Consult Note (Addendum)
[name]  Date of Admission:  07/23/2014  Date of Consult:  07/27/2014  Reason for Consult: bacteremia, cellulitis Referring Physician: Daleen Bo  Impression/Recommendation Bacteroides fragilis bacteremia LE cellulitis, abscess DM2 CHF CRI MGUS  Wound Cx- pending Changed to imipenem today Repeat BCx pending.  CT scan pending.   Comment- Would question if she has necrobiosis diabeticorum. Bacteroides is a very unusual organism to grow from cellulitis or a derm ulcer. May need to consider eval of her Gi tract.   Thank you so much for this interesting consult,   [name] (pager) [phone] [email or web address]  [name] is an 64 y.o. female.  HPI: 64 yo F with hx DM2, CKD, CHF (EF 30-35%), of BLE lymphedema, lower extremity wounds followed by wound care center. She states that these blisters started in March 2015 after receiving collagen dressings.  She was seen in wound care center 7-24 and was noted to have worsening blistering of her skin. She had I & D and was sent to the ED. In ED her WBC was normal, her temp was normal. She was started on vanco/levaquin. She had LE doppler that did not show DVT.  Her BCx from admission have grown Bacteroides in 2/2.  She was offered surgical eval which she declined.   Past Medical History  Diagnosis Date  . Personal history of other diseases of circulatory system   . 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 Chronic lymphocytic thyroiditis   . Morbid obesity   . Other chronic pulmonary heart diseases   . Obstructive sleep apnea (adult) (pediatric)   . Other and unspecified hyperlipidemia   . Fibromyalgia   . Goiter   . Thyroiditis, autoimmune   . Hypothyroidism, acquired, autoimmune   . Solitary kidney, acquired   . Fatigue   . Hyperparathyroidism, secondary   . Vitamin D deficiency disease   . Hyperparathyroidism   . H/O varicose veins 03/27/12  . Type II or  unspecified type diabetes mellitus without mention of complication, not stated as uncontrolled   . Diabetes mellitus type II   . Infections of kidney 03/27/12  . History of measles, mumps, or rubella 03/27/12  . Tubal pregnancy 12/23/76  . Unspecified essential hypertension     20 yrs ago  . Blood transfusion 1977    [facility]  . Complication of anesthesia 2010    hard to wake up after knee surgery  . Anemia   . Cardiomyopathy, nonischemic     EF 45 %  . Endometrial hyperplasia with atypia 05/05/12  . Diastolic HF (heart failure) 11/05/2012  . A-fib   . Echocardiogram abnormal 07/15/12    severe MR,mod to severe TR,mod to severe PA hypertension  . Pulmonary arterial hypertension   . PAF (paroxysmal atrial fibrillation), 1st episode since DCCV in Jan/14 03/19/2013  . Ulcers of both lower extremities, small, now with cellulitis 06/05/2013    Past Surgical History  Procedure Laterality Date  . Cholecystectomy    . Pacemaker placement  02/26/11    Medtronic Adapta  . Knee surgery    . Resection duplicate left kidney    . 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 Hernia repair  1999    ruptured ;  . D& c with hysteroscopy & cervical polypectomy  05/05/12  . Cardioversion N/A 02/06/2013    Successful  . Iud removal  01/30/13  . Ectopic pregnancy surgery  1978  .  Cardioversion N/A 06/08/2013    Procedure: CARDIOVERSION;  Surgeon: Sanda Klein, MD;  Location: [facility];  Service: Cardiovascular;  Laterality: N/A;  Room [phone]  . Cardioversion N/A 02/01/2014    Procedure: CARDIOVERSION;  Surgeon: Sanda Klein, MD;  Location: [facility];  Service: Cardiovascular;  Laterality: N/A;  . Cardioversion N/A 06/02/2014    Procedure: CARDIOVERSION;  Surgeon: Sanda Klein, MD;  Location: [facility];  Service: Cardiovascular;  Laterality: N/A;     Allergies  Allergen Reactions  . Ivp Dye [Iodinated Diagnostic Agents] Shortness Of Breath    Turn red, can't breathe  . Betadine [Povidone Iodine]     . Diphenhydramine Hcl Swelling    In hands and eyes  . Fish Allergy Nausea And Vomiting  . Iohexol      Desc: PT TURNS RED AND WHEEZING   . Penicillins Other (See Comments)    Resp arrest as child  . Povidone-Iodine Other (See Comments)    Wheezing, turn red, can't breath, and blisters  . Promethazine Hcl Other (See Comments)    Low Blood Pressure  . Tape     Blisters, can use paper tape for short periods  . Clindamycin Hives and Rash    wheezing  . Iodine Rash  . Morphine Sulfate Nausea And Vomiting and Rash    Medications:  Scheduled: . amiodarone  400 mg Oral BID  . calcitRIOL  0.25 mcg Oral BID  . calcium carbonate  1 tablet Oral QID   And  . cholecalciferol  200 Units Oral QID  . febuxostat  80 mg Oral Daily  . gabapentin  100 mg Oral BID  . imipenem-cilastatin  250 mg Intravenous 4 times per day  . insulin aspart  0-15 Units Subcutaneous TID WC  . insulin glargine  20 Units Subcutaneous QHS  . iron polysaccharides  150 mg Oral BID  . levothyroxine  125 mcg Oral QAC breakfast  . linagliptin  5 mg Oral Daily  . metoprolol tartrate  37.5 mg Oral BID  . multivitamin with minerals  1 tablet Oral Daily  . mupirocin ointment   Topical BID  . nitroGLYCERIN  0.4 mg Transdermal QODAY  . potassium chloride SA  20 mEq Oral QID  . sodium chloride  3 mL Intravenous Q12H  . torsemide  100 mg Oral Q breakfast  . torsemide  50 mg Oral q1800  . Vitamin D (Ergocalciferol)  50,000 Units Oral Once per day on Mon Thu  . Warfarin - Pharmacist Dosing Inpatient   Does not apply q1800    Abtx:  Anti-infectives   Start     Dose/Rate Route Frequency Ordered Stop   07/27/14 1200  imipenem-cilastatin (PRIMAXIN) 250 mg in sodium chloride 0.9 % 100 mL IVPB     250 mg 200 mL/hr over 30 Minutes Intravenous 4 times per day 07/27/14 1103     07/27/14 0200  metroNIDAZOLE (FLAGYL) IVPB 500 mg  Status:  Discontinued     500 mg 100 mL/hr over 60 Minutes Intravenous Every 6 hours 07/26/14 1859  07/27/14 1247   07/26/14 1900  metroNIDAZOLE (FLAGYL) IVPB 500 mg     500 mg 100 mL/hr over 60 Minutes Intravenous STAT 07/26/14 1858 07/26/14 2013   07/26/14 1000  vancomycin (VANCOCIN) 2,000 mg in sodium chloride 0.9 % 500 mL IVPB  Status:  Discontinued     2,000 mg 250 mL/hr over 120 Minutes Intravenous Every 48 hours 07/26/14 0445 07/27/14 1040   07/24/14 1000  vancomycin (VANCOCIN) 2,000 mg  in sodium chloride 0.9 % 500 mL IVPB  Status:  Discontinued     2,000 mg 250 mL/hr over 120 Minutes Intravenous Every 24 hours 07/23/14 1914 07/25/14 0914   07/23/14 2200  levofloxacin (LEVAQUIN) IVPB 750 mg  Status:  Discontinued     750 mg 100 mL/hr over 90 Minutes Intravenous Every 48 hours 07/23/14 2059 07/27/14 1040   07/23/14 1930  vancomycin (VANCOCIN) 2,000 mg in sodium chloride 0.9 % 500 mL IVPB     2,000 mg 250 mL/hr over 120 Minutes Intravenous NOW 07/23/14 1842 07/23/14 2111      Total days of antibiotics 5 (vanco/imipenem)          Social History:  reports that she has never smoked. She has never used smokeless tobacco. She reports that she does not drink alcohol or use illicit drugs.  Family History  Problem Relation Age of Onset  . Cardiomyopathy Brother   . Diabetes Mother   . Cancer Maternal Grandmother   . Heart attack Mother     General ROS: no dysphagia, has lsot 140#, normal BM, normal urination, see HPI.   Blood pressure 92/65, pulse 71, temperature 98.2 F (36.8 C), temperature source Oral, resp. rate 18, height $RemoveBe'5\' 6"'IOcFmDHvg$  (1.676 m), weight 149.6 kg (329 lb 12.9 oz), SpO2 97.00%. General appearance: alert, cooperative, no distress and morbidly obese Eyes: negative findings: conjunctivae and sclerae normal and pupils equal, round, reactive to light and accomodation Throat: normal findings: oropharynx pink & moist without lesions or evidence of thrush Neck: no adenopathy, supple, symmetrical, trachea midline and thyroid: enlarged and nodular Lungs: clear to auscultation  bilaterally Heart: regular rate and rhythm Abdomen: normal findings: bowel sounds normal and soft, non-tender Extremities: edema massive edema Skin: mulitple areas of blistering on LE. there are 2 deep lesions on her R and L thighs. there is no d/c noted. there is tracking noted on the L.    Results for orders placed during the hospital encounter of 07/23/14 (from the past 48 hour(s))  GLUCOSE, CAPILLARY     Status: Abnormal   Collection Time    07/25/14  5:08 PM      Result Value Ref Range   Glucose-Capillary 158 (*) 70 - 99 mg/dL  GLUCOSE, CAPILLARY     Status: Abnormal   Collection Time    07/25/14  8:31 PM      Result Value Ref Range   Glucose-Capillary 143 (*) 70 - 99 mg/dL   Comment 1 Documented in Chart     Comment 2 Notify RN    VANCOMYCIN, RANDOM     Status: None   Collection Time    07/25/14  9:52 PM      Result Value Ref Range   Vancomycin Rm 21.7     Comment:            Random Vancomycin therapeutic     range is dependent on dosage and     time of specimen collection.     A peak range is 20.0-40.0 ug/mL     A trough range is 5.0-15.0 ug/mL             CREATININE, SERUM     Status: Abnormal   Collection Time    07/25/14  9:52 PM      Result Value Ref Range   Creatinine, Ser 2.54 (*) 0.50 - 1.10 mg/dL   GFR calc non Af Amer 19 (*) >90 mL/min   GFR calc Af Amer 22 (*) >90  mL/min   Comment: (NOTE)     The eGFR has been calculated using the CKD EPI equation.     This calculation has not been validated in all clinical situations.     eGFR's persistently <90 mL/min signify possible Chronic Kidney     Disease.  PROTIME-INR     Status: Abnormal   Collection Time    07/26/14  4:00 AM      Result Value Ref Range   Prothrombin Time 26.3 (*) 11.6 - 15.2 seconds   INR 2.42 (*) 0.00 - 1.49  CBC     Status: Abnormal   Collection Time    07/26/14  4:00 AM      Result Value Ref Range   WBC 4.6  4.0 - 10.5 K/uL   RBC 2.78 (*) 3.87 - 5.11 MIL/uL   Hemoglobin 8.6 (*)  12.0 - 15.0 g/dL   HCT 26.9 (*) 36.0 - 46.0 %   MCV 96.8  78.0 - 100.0 fL   MCH 30.9  26.0 - 34.0 pg   MCHC 32.0  30.0 - 36.0 g/dL   RDW 18.4 (*) 11.5 - 15.5 %   Platelets 154  150 - 400 K/uL   Comment: DELTA CHECK NOTED     REPEATED TO VERIFY     SPECIMEN CHECKED FOR CLOTS  BASIC METABOLIC PANEL     Status: Abnormal   Collection Time    07/26/14  4:00 AM      Result Value Ref Range   Sodium 135 (*) 137 - 147 mEq/L   Potassium 4.4  3.7 - 5.3 mEq/L   Comment: DELTA CHECK NOTED     REPEATED TO VERIFY     NO VISIBLE HEMOLYSIS   Chloride 93 (*) 96 - 112 mEq/L   CO2 29  19 - 32 mEq/L   Glucose, Bld 123 (*) 70 - 99 mg/dL   BUN 79 (*) 6 - 23 mg/dL   Creatinine, Ser 2.68 (*) 0.50 - 1.10 mg/dL   Calcium 8.8  8.4 - 10.5 mg/dL   GFR calc non Af Amer 18 (*) >90 mL/min   GFR calc Af Amer 20 (*) >90 mL/min   Comment: (NOTE)     The eGFR has been calculated using the CKD EPI equation.     This calculation has not been validated in all clinical situations.     eGFR's persistently <90 mL/min signify possible Chronic Kidney     Disease.   Anion gap 13  5 - 15  GLUCOSE, CAPILLARY     Status: Abnormal   Collection Time    07/26/14  7:54 AM      Result Value Ref Range   Glucose-Capillary 121 (*) 70 - 99 mg/dL  GLUCOSE, CAPILLARY     Status: Abnormal   Collection Time    07/26/14 11:23 AM      Result Value Ref Range   Glucose-Capillary 128 (*) 70 - 99 mg/dL  FERRITIN     Status: Abnormal   Collection Time    07/26/14 11:30 AM      Result Value Ref Range   Ferritin 330 (*) 10 - 291 ng/mL   Comment: Performed at [facility] TIBC     Status: None   Collection Time    07/26/14 11:30 AM      Result Value Ref Range   Iron 112  42 - 135 ug/dL   TIBC 263  250 - 470 ug/dL   Saturation Ratios  43  20 - 55 %   UIBC 151  125 - 400 ug/dL   Comment: Performed at [facility], CAPILLARY     Status: Abnormal   Collection Time    07/26/14  4:41 PM      Result  Value Ref Range   Glucose-Capillary 125 (*) 70 - 99 mg/dL  GLUCOSE, CAPILLARY     Status: Abnormal   Collection Time    07/26/14  9:24 PM      Result Value Ref Range   Glucose-Capillary 122 (*) 70 - 99 mg/dL  PROTIME-INR     Status: Abnormal   Collection Time    07/27/14  4:38 AM      Result Value Ref Range   Prothrombin Time 22.3 (*) 11.6 - 15.2 seconds   INR 1.96 (*) 0.00 - 1.49  CBC     Status: Abnormal   Collection Time    07/27/14  4:38 AM      Result Value Ref Range   WBC 4.8  4.0 - 10.5 K/uL   RBC 2.78 (*) 3.87 - 5.11 MIL/uL   Hemoglobin 8.5 (*) 12.0 - 15.0 g/dL   HCT 26.9 (*) 36.0 - 46.0 %   MCV 96.8  78.0 - 100.0 fL   MCH 30.6  26.0 - 34.0 pg   MCHC 31.6  30.0 - 36.0 g/dL   RDW 18.2 (*) 11.5 - 15.5 %   Platelets 171  150 - 400 K/uL  BASIC METABOLIC PANEL     Status: Abnormal   Collection Time    07/27/14  4:38 AM      Result Value Ref Range   Sodium 135 (*) 137 - 147 mEq/L   Potassium 4.0  3.7 - 5.3 mEq/L   Chloride 94 (*) 96 - 112 mEq/L   CO2 27  19 - 32 mEq/L   Glucose, Bld 131 (*) 70 - 99 mg/dL   BUN 87 (*) 6 - 23 mg/dL   Creatinine, Ser 2.72 (*) 0.50 - 1.10 mg/dL   Calcium 9.0  8.4 - 10.5 mg/dL   GFR calc non Af Amer 17 (*) >90 mL/min   GFR calc Af Amer 20 (*) >90 mL/min   Comment: (NOTE)     The eGFR has been calculated using the CKD EPI equation.     This calculation has not been validated in all clinical situations.     eGFR's persistently <90 mL/min signify possible Chronic Kidney     Disease.   Anion gap 14  5 - 15  GLUCOSE, CAPILLARY     Status: Abnormal   Collection Time    07/27/14  7:37 AM      Result Value Ref Range   Glucose-Capillary 144 (*) 70 - 99 mg/dL  GLUCOSE, CAPILLARY     Status: Abnormal   Collection Time    07/27/14 11:12 AM      Result Value Ref Range   Glucose-Capillary 147 (*) 70 - 99 mg/dL      Component Value Date/Time   SDES LEG 07/24/2014 1544   SPECREQUEST Normal 07/24/2014 1544   CULT  Value: Culture reincubated for  better growth Performed at [facility] 07/24/2014 1544   REPTSTATUS PENDING 07/24/2014 1544   No results found. Recent Results (from the past 240 hour(s))  CULTURE, BLOOD (ROUTINE X 2)     Status: None   Collection Time    07/23/14  6:35 PM      Result Value Ref  Range Status   Specimen Description BLOOD LEFT ANTECUBITAL   Final   Special Requests BOTTLES DRAWN AEROBIC AND ANAEROBIC 5ML   Final   Culture  Setup Time     Final   Value: 07/23/2014 22:16     Performed at [facility]   Culture     Final   Value: BACTEROIDES FRAGILIS     Note: BETA LACTAMASE POSITIVE     Note: Gram Stain Report Called to,Read Back By and Verified With: Rulon Abide RN on 07/24/14 at 20:45 by Rise Mu     Performed at [facility]   Report Status 07/26/2014 FINAL   Final  CULTURE, BLOOD (ROUTINE X 2)     Status: None   Collection Time    07/23/14  6:38 PM      Result Value Ref Range Status   Specimen Description BLOOD RIGHT ANTECUBITAL   Final   Special Requests BOTTLES DRAWN AEROBIC AND ANAEROBIC 5ML   Final   Culture  Setup Time     Final   Value: 07/23/2014 22:16     Performed at [facility]   Culture     Final   Value: BACTEROIDES FRAGILIS     Note: CRITICAL RESULT CALLED TO, READ BACK BY AND VERIFIED WITH: JENNY BURNS RN _0  Thelma Comp [id number] BETA LACTAMASE POSITIVE     Performed at [facility]   Report Status 07/26/2014 FINAL   Final  WOUND CULTURE     Status: None   Collection Time    07/24/14  3:44 PM      Result Value Ref Range Status   Specimen Description LEG   Final   Special Requests Normal   Final   Gram Stain PENDING   Incomplete   Culture     Final   Value: Culture reincubated for better growth     Performed at [facility]   Report Status PENDING   Incomplete      07/27/2014, 3:15 PM     LOS: 4 days     **Disclaimer: This note may have been dictated with voice recognition software. Similar sounding words can inadvertently  be transcribed and this note may contain transcription errors which may not have been corrected upon publication of note.**

## 2014-07-27 NOTE — Progress Notes (Addendum)
Pt refused cpap tonight, stating she doesn't need it.  Pt was advised that RT is available all night should she change her mind.

## 2014-07-27 NOTE — Progress Notes (Signed)
ANTICOAGULATION CONSULT NOTE - Follow Up  Pharmacy Consult for warfarin Indication: afib   Allergies  Allergen Reactions  . Ivp Dye [Iodinated Diagnostic Agents] Shortness Of Breath    Turn red, can't breathe  . Betadine [Povidone Iodine]   . Diphenhydramine Hcl Swelling    In hands and eyes  . Fish Allergy Nausea And Vomiting  . Iohexol      Desc: PT TURNS RED AND WHEEZING   . Penicillins Other (See Comments)    Resp arrest as child  . Povidone-Iodine Other (See Comments)    Wheezing, turn red, can't breath, and blisters  . Promethazine Hcl Other (See Comments)    Low Blood Pressure  . Tape     Blisters, can use paper tape for short periods  . Clindamycin Hives and Rash    wheezing  . Iodine Rash  . Morphine Sulfate Nausea And Vomiting and Rash    Patient Measurements: Height: 5\' 6"  (167.6 cm) Weight: 329 lb 12.9 oz (149.6 kg) IBW/kg (Calculated) : 59.3 Vital Signs: Temp: 98 F (36.7 C) (07/28 0524) Temp src: Oral (07/28 0524) BP: 112/67 mmHg (07/28 0524) Pulse Rate: 69 (07/28 0524)  Labs:  Recent Labs  07/25/14 0500 07/25/14 2152 07/26/14 0400 07/27/14 0438  HGB  --   --  8.6* 8.5*  HCT  --   --  26.9* 26.9*  PLT  --   --  154 171  LABPROT 30.3*  --  26.3* 22.3*  INR 2.90*  --  2.42* 1.96*  CREATININE 2.37* 2.54* 2.68* 2.72*    Estimated Creatinine Clearance: 31.1 ml/min (by C-G formula based on Cr of 2.72).   Assessment: 11 YOF on chronic warfarin/amiodarone therapy for afib. INR supratherapeutic at admission.   Home dose 5mg  daily with 2.5mg  MWF  Today's INR is slightly SUBtherapeutic (1.96)  CBC: Hgb low but stable. No bleeding reported per chart notes.  BLE dopplers neg for DVT  Diet: heart healthy, eating 50-100% of meals  Drug interactions:  Started on levofloxacin 7/24. Flagyl 7/27 - can increase warfarin's effects  Chronic amiodarone - dosage increased in May outpatient, possible contribution to elevated INR as effects are  typically seen weeks/months into therapy  Goal of Therapy:  INR 2-3   Plan:   Give Warfarin 5mg  x1 today at [facility]  Daily INR  Kizzie Furnish, PharmD Pager: [phone] 07/27/2014 8:22 AM

## 2014-07-27 NOTE — Progress Notes (Addendum)
[facility] PROGRESS NOTE  MYLES MALLICOAT FWY:[id number] DOB: April 03, 1950 DOA: 07/23/2014 PCP: Elby Showers, MD  Assessment/Plan: 64 y.o. 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 Some of the blisters have spontaneously drained, she went to the wound care center today, where she had a small I&D done on the left lower medial thigh, and was sent to the ED for further evaluation  1. Cellulitis and abscess of bilateral lower extremity due to severe lymphedema with progressive draining ulcers;  -empiric vancomycin and Levaquin (chnaged to Imipenem per ID recs 7/28), pend wound culture, wound care consulted; Keep lower extremity elevated.  -7/27: obtain CT leg r/o abscesses; Pt recently had OP I&D; offered surgical eval, she declined  -cont diuresis as needed; since leg with severe edema; leg [place]: no DVT  2. Bacteremia/sepsis;  -7/24: blood cultures + GNR (BACTEROIDES FRAGILIS); likely source leg wounds  -7/28: added flagyl IV, repeat blood cultures 7/28; d/w ID recommende to change to Imipenem; obtain ID evaluation;   2. Type 2 diabetes mellitus Ha1c-7.0 (02/2014) -decrease lantus 28-->20 Units due to hypoglycemia; cont SSI; hold gliburide with CKD 3. PAF (paroxysmal atrial fibrillation)  -currently with a paced rhythm, monitor in telemetry. Coumadin to be dosed by pharmacy. Monitor INR due to interaction of atx+coumadin 4. Chronic CHF, systolic HF, with dilated Cardiomyopathy, nonischemic -echo (2014): LVEF 30-35%, Diffuse Hypokinesis, severe MR, dilated LA, RA, RA, LV, TR, pulmonary HTN;  -clinically compensated. Continue Demadex. Strict  I&O's, daily weights.  5. Hypothyroidism, acquired, autoimmune  Continue levothyroxine  6. Hypocalcemia  Continue with calcium, vitamin D supplementation. Monitor lites periodically  7. CKD (chronic kidney disease) stage 4, GFR 15-29 ml/min; h/o nephrectomy  -creatinine close to usual baseline. Monitor closely with diuretics  8. Obesity hypoventilation syndrome CPAP each bedtime  9. OBESITY, MORBID; cont encourage weigh loss, diet exercise   10.  Leg pain, neuropathy, add neurontin, cont prn opioids (tolerated opioids well no reaction) 11. H/o IDA: no acute s/s of bleeding; h/o MGUS; check iron profile; monitor;   Prognosis is guarded;   Further plan will depend as patient's clinical course evolves and further radiologic and laboratory data become available. Patient will be monitored closely.   Code Status: full Family Communication:  D/w patient  (indicate person spoken with, relationship, and if by phone, the number) Disposition Plan: home pend clinical improvement    Consultants:  None   Procedures:  none  Antibiotics:  vanc 7/24<<<<7/28  Levofloxacin 7/24<<<<<7/28  Metronidazole 7/27<<<  Imipenem 7/28<<<<<     (indicate start date, and stop date if known)  HPI/Subjective: alert  Objective: Filed Vitals:   07/27/14 0524  BP: 112/67  Pulse: 69  Temp: 98 F (36.7 C)  Resp: 18    Intake/Output Summary (Last 24 hours) at 07/27/14 1017 Last data filed at 07/27/14 0525  Gross per 24 hour  Intake    240 ml  Output   1800 ml  Net  -1560 ml   Filed Weights   07/25/14 0614 07/26/14 0417 07/27/14 0524  Weight: 149 kg (328 lb 7.8 oz) 149 kg (328 lb 7.8 oz) 149.6 kg (329 lb 12.9 oz)  Exam:   General:  alert  Cardiovascular: s1,s2 rrr  Respiratory: CTA BL  Abdomen: soft, obese, nt  Musculoskeletal: thigh ulcer draining, packed, erythema legs, cellulitis; BL sever lyphedema    Data Reviewed: Basic Metabolic Panel:  Recent Labs Lab  07/23/14 1803 07/24/14 0413 07/25/14 0500 07/25/14 2152 07/26/14 0400 07/27/14 0438  NA 135* 137 134*  --  135* 135*  K 3.1* 3.2* 3.6*  --  4.4 4.0  CL 91* 94* 92*  --  93* 94*  CO2 30 31 28   --  29 27  GLUCOSE 194* 134* 74  --  123* 131*  BUN 75* 72* 70*  --  79* 87*  CREATININE 2.20* 2.18* 2.37* 2.54* 2.68* 2.72*  CALCIUM 9.1 8.8 8.7  --  8.8 9.0   Liver Function Tests:  Recent Labs Lab 07/24/14 0413  AST 19  ALT 18  ALKPHOS 127*  BILITOT 1.0  PROT 7.8  ALBUMIN 2.5*   No results found for this basename: LIPASE, AMYLASE,  in the last 168 hours No results found for this basename: AMMONIA,  in the last 168 hours CBC:  Recent Labs Lab 07/23/14 1803 07/24/14 0413 07/26/14 0400 07/27/14 0438  WBC 5.7 7.0 4.6 4.8  NEUTROABS 4.9  --   --   --   HGB 10.2* 9.6* 8.6* 8.5*  HCT 31.2* 29.8* 26.9* 26.9*  MCV 96.0 96.8 96.8 96.8  PLT 250 217 154 171   Cardiac Enzymes: No results found for this basename: CKTOTAL, CKMB, CKMBINDEX, TROPONINI,  in the last 168 hours BNP (last 3 results) No results found for this basename: PROBNP,  in the last 8760 hours CBG:  Recent Labs Lab 07/26/14 0754 07/26/14 1123 07/26/14 1641 07/26/14 2124 07/27/14 0737  GLUCAP 121* 128* 125* 122* 144*    Recent Results (from the past 240 hour(s))  CULTURE, BLOOD (ROUTINE X 2)     Status: None   Collection Time    07/23/14  6:35 PM      Result Value Ref Range Status   Specimen Description BLOOD LEFT ANTECUBITAL   Final   Special Requests BOTTLES DRAWN AEROBIC AND ANAEROBIC 5ML   Final   Culture  Setup Time     Final   Value: 07/23/2014 22:16     Performed at [facility]   Culture     Final   Value: BACTEROIDES FRAGILIS     Note: BETA LACTAMASE POSITIVE     Note: Gram Stain Report Called to,Read Back By and Verified With: Rulon Abide RN on 07/24/14 at 20:45 by Rise Mu     Performed at [facility]   Report Status 07/26/2014 FINAL   Final  CULTURE, BLOOD (ROUTINE X  2)     Status: None   Collection Time    07/23/14  6:38 PM      Result Value Ref Range Status   Specimen Description BLOOD RIGHT ANTECUBITAL   Final   Special Requests BOTTLES DRAWN AEROBIC AND ANAEROBIC 5ML   Final   Culture  Setup Time     Final   Value: 07/23/2014 22:16     Performed at [facility]   Culture     Final   Value: BACTEROIDES FRAGILIS     Note: CRITICAL RESULT CALLED TO, READ BACK BY AND VERIFIED WITH: JENNY BURNS RN @1015PM  [place] [id number] BETA LACTAMASE POSITIVE     Performed at [facility]   Report Status 07/26/2014 FINAL   Final  WOUND  CULTURE     Status: None   Collection Time    07/24/14  3:44 PM      Result Value Ref Range Status   Specimen Description LEG   Final   Special Requests Normal   Final   Gram Stain PENDING   Incomplete   Culture     Final   Value: Culture reincubated for better growth     Performed at [facility]   Report Status PENDING   Incomplete     Studies: No results found.  Scheduled Meds: . amiodarone  400 mg Oral BID  . calcitRIOL  0.25 mcg Oral BID  . calcium carbonate  1 tablet Oral QID   And  . cholecalciferol  200 Units Oral QID  . febuxostat  80 mg Oral Daily  . gabapentin  100 mg Oral BID  . insulin aspart  0-15 Units Subcutaneous TID WC  . insulin glargine  20 Units Subcutaneous QHS  . iron polysaccharides  150 mg Oral BID  . levofloxacin (LEVAQUIN) IV  750 mg Intravenous Q48H  . levothyroxine  125 mcg Oral QAC breakfast  . linagliptin  5 mg Oral Daily  . metoprolol tartrate  37.5 mg Oral BID  . metronidazole  500 mg Intravenous Q6H  . multivitamin with minerals  1 tablet Oral Daily  . mupirocin ointment   Topical BID  . nitroGLYCERIN  0.4 mg Transdermal QODAY  . potassium chloride SA  20 mEq Oral QID  . sodium chloride  3 mL Intravenous Q12H  . torsemide  100 mg Oral Q breakfast  . torsemide  50 mg Oral q1800  . vancomycin  2,000 mg Intravenous Q48H  . Vitamin D (Ergocalciferol)  50,000  Units Oral Once per day on Mon Thu  . Warfarin - Pharmacist Dosing Inpatient   Does not apply q1800   Continuous Infusions:   Active Problems:   OBESITY, MORBID   HYPERTENSION   PACEMAKER, PERMANENT   Obesity hypoventilation syndrome   Hypothyroidism, acquired, autoimmune   Solitary kidney, acquired   Cardiomyopathy, nonischemic- EF 30-35% June 2014   Type 2 diabetes mellitus   CKD (chronic kidney disease) stage 4, GFR 15-29 ml/min   PAF (paroxysmal atrial fibrillation)   Hypocalcemia   Cellulitis and abscess    Time spent: >35 minutes     Kinnie Feil  [facility] Pager [phone]. If 7PM-7AM, please contact night-coverage at [email or web address], password [facility] 07/27/2014, 10:17 AM  LOS: 4 days

## 2014-07-28 ENCOUNTER — Inpatient Hospital Stay (HOSPITAL_COMMUNITY): Payer: Medicare Other

## 2014-07-28 ENCOUNTER — Encounter (HOSPITAL_COMMUNITY): Payer: Self-pay | Admitting: General Surgery

## 2014-07-28 DIAGNOSIS — S81009A Unspecified open wound, unspecified knee, initial encounter: Secondary | ICD-10-CM

## 2014-07-28 DIAGNOSIS — L02419 Cutaneous abscess of limb, unspecified: Secondary | ICD-10-CM

## 2014-07-28 DIAGNOSIS — L03119 Cellulitis of unspecified part of limb: Secondary | ICD-10-CM

## 2014-07-28 DIAGNOSIS — S81809A Unspecified open wound, unspecified lower leg, initial encounter: Secondary | ICD-10-CM

## 2014-07-28 DIAGNOSIS — S91009A Unspecified open wound, unspecified ankle, initial encounter: Secondary | ICD-10-CM

## 2014-07-28 LAB — CBC
HEMATOCRIT: 29.7 % — AB (ref 36.0–46.0)
Hemoglobin: 9.4 g/dL — ABNORMAL LOW (ref 12.0–15.0)
MCH: 30.8 pg (ref 26.0–34.0)
MCHC: 31.6 g/dL (ref 30.0–36.0)
MCV: 97.4 fL (ref 78.0–100.0)
Platelets: 221 10*3/uL (ref 150–400)
RBC: 3.05 MIL/uL — ABNORMAL LOW (ref 3.87–5.11)
RDW: 18.4 % — AB (ref 11.5–15.5)
WBC: 6.2 10*3/uL (ref 4.0–10.5)

## 2014-07-28 LAB — GLUCOSE, CAPILLARY
Glucose-Capillary: 170 mg/dL — ABNORMAL HIGH (ref 70–99)
Glucose-Capillary: 166 mg/dL — ABNORMAL HIGH (ref 70–99)
Glucose-Capillary: 124 mg/dL — ABNORMAL HIGH (ref 70–99)

## 2014-07-28 LAB — VANCOMYCIN, RANDOM: Vancomycin Rm: 23.2 ug/mL

## 2014-07-28 LAB — BASIC METABOLIC PANEL
Anion gap: 13 (ref 5–15)
BUN: 91 mg/dL — AB (ref 6–23)
CALCIUM: 9.3 mg/dL (ref 8.4–10.5)
CO2: 29 mEq/L (ref 19–32)
CREATININE: 2.8 mg/dL — AB (ref 0.50–1.10)
Chloride: 92 mEq/L — ABNORMAL LOW (ref 96–112)
GFR, EST AFRICAN AMERICAN: 19 mL/min — AB (ref >90)
GFR, EST NON AFRICAN AMERICAN: 17 mL/min — AB (ref >90)
GLUCOSE: 174 mg/dL — AB (ref 70–99)
POTASSIUM: 4.2 meq/L (ref 3.7–5.3)
Sodium: 134 mEq/L — ABNORMAL LOW (ref 137–147)

## 2014-07-28 LAB — PROTIME-INR
INR: 1.76 — AB (ref 0.00–1.49)
Prothrombin Time: 20.5 seconds — ABNORMAL HIGH (ref 11.6–15.2)

## 2014-07-28 MED ORDER — METOLAZONE 2.5 MG PO TABS
2.5000 mg | ORAL_TABLET | Freq: Once | ORAL | Status: AC
Start: 2014-07-29 — End: 2014-07-29
  Administered 2014-07-29: 2.5 mg via ORAL
  Filled 2014-07-28: qty 1

## 2014-07-28 MED ORDER — INSULIN GLARGINE 100 UNIT/ML ~~LOC~~ SOLN
28.0000 [IU] | Freq: Every day | SUBCUTANEOUS | Status: DC
  Administered 2014-07-28 – 2014-08-02 (×6): 28 [IU] via SUBCUTANEOUS
  Filled 2014-07-28 (×6): qty 0.28

## 2014-07-28 MED ORDER — WARFARIN SODIUM 7.5 MG PO TABS
7.5000 mg | ORAL_TABLET | Freq: Once | ORAL | Status: AC
  Administered 2014-07-28: 7.5 mg via ORAL
  Filled 2014-07-28: qty 1

## 2014-07-28 MED ORDER — ALPRAZOLAM 0.25 MG PO TABS
0.2500 mg | ORAL_TABLET | Freq: Three times a day (TID) | ORAL | Status: DC | PRN
  Administered 2014-07-28 – 2014-08-02 (×9): 0.25 mg via ORAL
  Filled 2014-07-28 (×9): qty 1

## 2014-07-28 MED ORDER — METOPROLOL TARTRATE 12.5 MG HALF TABLET
12.5000 mg | ORAL_TABLET | Freq: Two times a day (BID) | ORAL | Status: DC
  Administered 2014-07-28 – 2014-08-01 (×8): 12.5 mg via ORAL
  Filled 2014-07-28 (×9): qty 1

## 2014-07-28 NOTE — Consult Note (Signed)
Seen, agree with above.  Hydrotherapy. Silvadene to superficial wounds.

## 2014-07-28 NOTE — Progress Notes (Addendum)
[facility] PROGRESS NOTE  [name]:[id number] DOB: June 22, 1950 DOA: 07/23/2014 PCP: Elby Showers, MD  Assessment/Plan  64 y.o. 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  Pt developed a blister at multiple spots in her bilateral lower legs on Tuesday, following which she noted that her legs were very erythematous. Some of the blisters have spontaneously drained, she went to the wound care center where she had a small I&D done on the left lower medial thigh and she was sent to the ED for further evaluation.  1. Cellulitis and abscess of bilateral lower extremity due to severe lymphedema with progressive draining ulcers.   - Continue vancomycin s/p 5 days (stopped 7/28) -  Will resume pending results of wound culture - Continue Imipenem, day 2 -  Wound culture:  Few S. Aureus, sensitivities pending -  Appreciate wound care assistance -  CT leg:  Soft tissue edema within both thighs consistent with cellulitis, no focal underlying fluid collection or evidence of osteomyelitis -  cont diuresis  -  Leg [place]: no DVT  -  General surgery consult for possible debridement  2. 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 Type 2 diabetes mellitus Ha1c-7.0 (02/2014), CBG mildly elevated -  Increase lantus 28 -  Cont SSI -  Hold gliburide  3. PAF (paroxysmal atrial fibrillation)  -  currently with a paced rhythm -  Coumadin to be dosed by pharmacy -  INR subtherapeutic -  No significant arrhythmias, okay to  D/c telemetry   4. Chronic CHF, systolic HF, with dilated Cardiomyopathy, nonischemic,  compensated -  Echo (2014): LVEF 30-35%, Diffuse Hypokinesis, severe MR, dilated LA, RA, RA, LV, TR, pulmonary HTN -  Hold Demadex due to rising BUN:Cr and mild hypotension - Daily weights:  Wt stable  Hypotension likely due ot dehydration -  Hold diuretics -  Reduce dose of BB (already has hold parameter for BP)  5. Hypothyroidism, acquired, autoimmune Continue levothyroxine   6. Hypocalcemia Continue with calcium, vitamin D supplementation. Monitor lites periodically   7. CKD (chronic kidney disease) stage 4, GFR 15-29 ml/min; h/o nephrectomy, creatinine creeping up -  Minimize nephrotoxins and renally dose medications -  Hold diuretics  8. Obesity hypoventilation syndrome CPAP each bedtime   9. OBESITY, MORBID; cont encourage weigh loss, diet exercise   10. Leg pain, neuropathy -  Continue neurontin, cont prn opioids (tolerated opioids well no reaction)   11. H/o IDA: no acute s/s of bleeding; h/o MGUS -  Iron studies wnl -  Defer to PCP/hematology  Diet:  Healthy heart/diabetic Access:  PIV IVF:  Off Proph:  Coumadin  Code Status: Full Family Communication: patient alone Disposition Plan:  Pending further eval for blood stream infection.  Refuses rehab and PICC placement.   Consultants:  Infectious disease  Procedures:  CT lower extremity  Duplex lower extremity  Antibiotics: vanc 7/24 >> 7/28 Levofloxacin 7/24 >>> 7/28  Metronidazole 7/27>>> 7/28  Imipenem 7/28 >>>   HPI/Subjective:  Weight is up 3 pounds.  Denies SOB, nausea, vomiting, diarrhea.  Legs still painful and swollen   Objective: Filed Vitals:  07/27/14 0524 07/27/14 1326 07/27/14 2112 07/28/14 0438  BP: 112/67 92/65 102/53 109/71  Pulse: 69 71 70 69  Temp: 98 F (36.7 C) 98.2 F (36.8 C) 98.1 F (36.7 C) 98.1 F (36.7 C)  TempSrc: Oral Oral Oral Oral  Resp: 18 18 19 19   Height:      Weight: 149.6 kg (329 lb 12.9 oz)   150.141 kg (331 lb)  SpO2: 100% 97% 97% 100%     Intake/Output Summary (Last 24 hours) at 07/28/14 0944 Last data filed at 07/28/14 2130  Gross per 24 hour  Intake    920 ml  Output   1000 ml  Net    -80 ml   Filed Weights   07/26/14 0417 07/27/14 0524 07/28/14 0438  Weight: 149 kg (328 lb 7.8 oz) 149.6 kg (329 lb 12.9 oz) 150.141 kg (331 lb)    Exam:   General:  Obese CF, No acute distress  HEENT:  NCAT, MMM  Cardiovascular:  RRR, nl S1, S2 no mrg, 2+ pulses, warm extremities  Respiratory:  CTAB, no increased WOB  Abdomen:   NABS, soft, NT/ND  MSK:   Normal tone and bulk, bilateral lymphedema.  Necrotic ulcer left leg, multiple ulcers bilateral lower extremities  Neuro:  Grossly intact but diffusely weak.    Data Reviewed: Basic Metabolic Panel:  Recent Labs Lab 07/24/14 0413 07/25/14 0500 07/25/14 2152 07/26/14 0400 07/27/14 0438 07/28/14 0448  NA 137 134*  --  135* 135* 134*  K 3.2* 3.6*  --  4.4 4.0 4.2  CL 94* 92*  --  93* 94* 92*  CO2 31 28  --  29 27 29   GLUCOSE 134* 74  --  123* 131* 174*  BUN 72* 70*  --  79* 87* 91*  CREATININE 2.18* 2.37* 2.54* 2.68* 2.72* 2.80*  CALCIUM 8.8 8.7  --  8.8 9.0 9.3   Liver Function Tests:  Recent Labs Lab 07/24/14 0413  AST 19  ALT 18  ALKPHOS 127*  BILITOT 1.0  PROT 7.8  ALBUMIN 2.5*   No results found for this basename: LIPASE, AMYLASE,  in the last 168 hours No results found for this basename: AMMONIA,  in the last 168 hours CBC:  Recent Labs Lab 07/23/14 1803 07/24/14 0413 07/26/14 0400 07/27/14 0438 07/28/14 0448  WBC 5.7 7.0 4.6 4.8 6.2  NEUTROABS 4.9  --   --   --   --   HGB 10.2* 9.6* 8.6* 8.5* 9.4*  HCT 31.2* 29.8* 26.9* 26.9* 29.7*  MCV 96.0 96.8 96.8 96.8 97.4  PLT 250 217 154 171 221   Cardiac Enzymes: No results found for this basename: CKTOTAL, CKMB, CKMBINDEX, TROPONINI,  in the last 168 hours BNP (last 3 results) No results found for this basename: PROBNP,  in the last 8760 hours CBG:  Recent Labs Lab 07/26/14 2124  07/27/14 0737 07/27/14 1112 07/27/14 1656 07/28/14 0722  GLUCAP 122* 144* 147* 158* 170*    Recent Results (from the past 240 hour(s))  CULTURE, BLOOD (ROUTINE X 2)     Status: None   Collection Time    07/23/14  6:35 PM      Result Value Ref Range Status   Specimen Description BLOOD LEFT ANTECUBITAL   Final   Special Requests BOTTLES DRAWN AEROBIC AND ANAEROBIC 5ML   Final   Culture  Setup Time     Final   Value: 07/23/2014 22:16     Performed at [facility]  Culture     Final   Value: BACTEROIDES FRAGILIS     Note: BETA LACTAMASE POSITIVE     Note: Gram Stain Report Called to,Read Back By and Verified With: Rulon Abide RN on 07/24/14 at 20:45 by Rise Mu     Performed at [facility]   Report Status 07/26/2014 FINAL   Final  CULTURE, BLOOD (ROUTINE X 2)     Status: None   Collection Time    07/23/14  6:38 PM      Result Value Ref Range Status   Specimen Description BLOOD RIGHT ANTECUBITAL   Final   Special Requests BOTTLES DRAWN AEROBIC AND ANAEROBIC 5ML   Final   Culture  Setup Time     Final   Value: 07/23/2014 22:16     Performed at [facility]   Culture     Final   Value: BACTEROIDES FRAGILIS     Note: CRITICAL RESULT CALLED TO, READ BACK BY AND VERIFIED WITH: JENNY BURNS RN @1015PM  VINCJ [id number] BETA LACTAMASE POSITIVE     Performed at [facility]   Report Status 07/26/2014 FINAL   Final  WOUND CULTURE     Status: None   Collection Time    07/24/14  3:44 PM      Result Value Ref Range Status   Specimen Description LEG   Final   Special Requests Normal   Final   Gram Stain     Final   Value: RARE SQUAMOUS EPITHELIAL CELLS PRESENT     RARE WBC PRESENT, PREDOMINANTLY PMN     FEW GRAM POSITIVE COCCI IN PAIRS     FEW GRAM NEGATIVE RODS     Performed at [facility]   Culture     Final   Value: FEW STAPHYLOCOCCUS AUREUS     Note: RIFAMPIN AND GENTAMICIN SHOULD NOT BE USED AS SINGLE DRUGS FOR TREATMENT OF STAPH  INFECTIONS.     Performed at [facility]   Report Status PENDING   Incomplete     Studies: Ct Extrem Lower Wo Cm Bil  07/27/2014   CLINICAL DATA:  Open wounds involving both distal thighs medially. Evaluate cellulitis.  EXAM: CT OF THE LOWER BILATERAL EXTREMITY WITHOUT CONTRAST  TECHNIQUE: Multidetector CT imaging of both thighs was performed according to the standard protocol. Images extend from the lower pelvis through the proximal lower legs. No contrast was administered.  COMPARISON:  None.  FINDINGS: There is no evidence of acute fracture or dislocation. There are no significant arthropathic changes at either hip. There are advanced tricompartmental degenerative changes at both knees with large osteophytes. There are possible intra-articular loose bodies, especially on the right.  There is diffuse muscular atrophy in both thighs. Multiple superficial varicosities are present anteriorly within the right thigh. There is diffuse subcutaneous edema throughout both thighs, especially distally. The soft tissues are incompletely visualized due to the patient's body habitus. There is an area of skin ulceration medially in the distal left thigh, best seen on axial image number 98. There is some adjacent soft tissue emphysema. No underlying focal fluid collection or foreign body is seen.  IMPRESSION: 1. Diffuse soft tissue edema within both thighs consistent with cellulitis. There is an area of skin ulceration medially in the distal left thigh with mild adjacent soft tissue emphysema. No focal underlying fluid collection is identified to suggest an abscess. 2. Multiple varicosities are present within the right thigh. 3. No evidence of osteomyelitis. 4. 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   Electronically Signed   By: Camie Patience M.D.   On: 07/27/2014 16:30    Scheduled Meds: . amiodarone  400 mg Oral BID  . calcitRIOL  0.25 mcg Oral BID  . calcium carbonate  1 tablet Oral  QID   And  . cholecalciferol  200 Units Oral QID  . febuxostat  80 mg Oral Daily  . gabapentin  100 mg Oral BID  . imipenem-cilastatin  250 mg Intravenous 4 times per day  . insulin aspart  0-15 Units Subcutaneous TID WC  . insulin glargine  20 Units Subcutaneous QHS  . iron polysaccharides  150 mg Oral BID  . levothyroxine  125 mcg Oral QAC breakfast  . linagliptin  5 mg Oral Daily  . metoprolol tartrate  37.5 mg Oral BID  . multivitamin with minerals  1 tablet Oral Daily  . mupirocin ointment   Topical BID  . nitroGLYCERIN  0.4 mg Transdermal QODAY  . potassium chloride SA  20 mEq Oral QID  . sodium chloride  3 mL Intravenous Q12H  . torsemide  100 mg Oral Q breakfast  . torsemide  50 mg Oral q1800  . Vitamin D (Ergocalciferol)  50,000 Units Oral Once per day on Mon Thu  . warfarin  7.5 mg Oral ONCE-1800  . Warfarin - Pharmacist Dosing Inpatient   Does not apply q1800   Continuous Infusions:   Active Problems:   OBESITY, MORBID   HYPERTENSION   PACEMAKER, PERMANENT   Obesity hypoventilation syndrome   Hypothyroidism, acquired, autoimmune   Solitary kidney, acquired   Cardiomyopathy, nonischemic- EF 30-35% June 2014   Type 2 diabetes mellitus   CKD (chronic kidney disease) stage 4, GFR 15-29 ml/min   PAF (paroxysmal atrial fibrillation)   Hypocalcemia   Cellulitis and abscess    Time spent: 30 min    Carvel Huskins, [facility] Pager [phone]. If 7PM-7AM, please contact night-coverage at [email or web address], password [facility] 07/28/2014, 9:44 AM  LOS: 5 days

## 2014-07-28 NOTE — Progress Notes (Signed)
ANTICOAGULATION CONSULT NOTE - Follow Up  Pharmacy Consult for warfarin Indication: afib   Allergies  Allergen Reactions  . Ivp Dye [Iodinated Diagnostic Agents] Shortness Of Breath    Turn red, can't breathe  . Betadine [Povidone Iodine]   . Diphenhydramine Hcl Swelling    In hands and eyes  . Fish Allergy Nausea And Vomiting  . Iohexol      Desc: PT TURNS RED AND WHEEZING   . Penicillins Other (See Comments)    Resp arrest as child  . Povidone-Iodine Other (See Comments)    Wheezing, turn red, can't breath, and blisters  . Promethazine Hcl Other (See Comments)    Low Blood Pressure  . Tape     Blisters, can use paper tape for short periods  . Clindamycin Hives and Rash    wheezing  . Iodine Rash  . Morphine Sulfate Nausea And Vomiting and Rash    Patient Measurements: Height: 5\' 6"  (167.6 cm) Weight: 331 lb (150.141 kg) IBW/kg (Calculated) : 59.3 Vital Signs: Temp: 98.1 F (36.7 C) (07/29 0438) Temp src: Oral (07/29 0438) BP: 109/71 mmHg (07/29 0438) Pulse Rate: 69 (07/29 0438)  Labs:  Recent Labs  07/26/14 0400 07/27/14 0438 07/28/14 0448  HGB 8.6* 8.5* 9.4*  HCT 26.9* 26.9* 29.7*  PLT 154 171 221  LABPROT 26.3* 22.3* 20.5*  INR 2.42* 1.96* 1.76*  CREATININE 2.68* 2.72* 2.80*    Estimated Creatinine Clearance: 30.2 ml/min (by C-G formula based on Cr of 2.8).   Assessment: 35 YOF on chronic warfarin/amiodarone therapy for afib. INR supratherapeutic at admission.   Home dose 5mg  daily with 2.5mg  MWF  Today's INR is slightly SUBtherapeutic (1.76)  CBC: Hgb trending up. No bleeding reported per chart notes.  BLE dopplers neg for DVT  Diet: heart healthy, eating 50-100% of meals  Drug interactions:  Started on levofloxacin 7/24. Flagyl 7/2. Both discontinued on 7/28 - can increase warfarin's effects  Chronic amiodarone - dosage increased in May outpatient, possible contribution to elevated INR as effects are typically seen weeks/months into  therapy  Goal of Therapy:  INR 2-3   Plan:   Give Warfarin 7.5mg  x1 today at [facility]  Daily INR  Kizzie Furnish, PharmD Pager: [phone] 07/28/2014 7:46 AM

## 2014-07-28 NOTE — Progress Notes (Signed)
PT Cancellation Note  Patient Details Name: AMELYA MABRY MRN: [id number] DOB: 24-Jan-1950   Cancelled Treatment:    Reason Eval/Treat Not Completed: Patient at procedure or test/unavailable Pt reports she is about to go for CT and would like to start PT tomorrow.   Keniel Ralston,KATHrine E 07/28/2014, 2:16 PM Carmelia Bake, PT, DPT 07/28/2014 Pager: [phone]

## 2014-07-28 NOTE — Consult Note (Signed)
Reason for Consult: LLE necrotic wounds Referring Physician: Dr. Janece Canterbury   HPI: Latera Mclin is a 64 year old female with a history of PAF, diastolic heart failure, diabetes mellitus type II, morbid obesity, pulmonary HTN, on coumadin and severe lymphedema admitted with lower extremity cellulitis.  The patient reports increased edema and drainage from her lower extremities.  She has been followed at the wound center for this since March.  She reports often having blisters that pop on their own.  She has been treated with leg wraps as well as collagenase which she reports she has not been able to tolerate.  She reports falling 1 week ago at her doctors office and landing on her left inner thigh.  Ever since then she has had worsening pain and drainage.  She was started on vanc and levaquin for cellulitis and bacteremia.  ID was consulted for +cultures showing bacteroides fagilis.  She had a CT of lower extremities on 5/28 which was negative for abscess, showed soft tissue edema consistent with cellulitis.  We have been asked to evaluate the patient for necrotic wounds.     Past Medical History  Diagnosis Date  . Personal history of other diseases of circulatory system   . 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 Chronic lymphocytic thyroiditis   . Morbid obesity   . Other chronic pulmonary heart diseases   . Obstructive sleep apnea (adult) (pediatric)   . Other and unspecified hyperlipidemia   . Fibromyalgia   . Goiter   . Thyroiditis, autoimmune   . Hypothyroidism, acquired, autoimmune   . Solitary kidney, acquired   . Fatigue   . Hyperparathyroidism, secondary   . Vitamin D deficiency disease   . Hyperparathyroidism   . H/O varicose veins 03/27/12  . Type II or unspecified type diabetes mellitus without mention of complication, not stated as uncontrolled   . Diabetes mellitus type II   . Infections of kidney 03/27/12  . History of measles, mumps, or rubella  03/27/12  . Tubal pregnancy 12/23/76  . Unspecified essential hypertension     20 yrs ago  . Blood transfusion 1977    [facility]  . Complication of anesthesia 2010    hard to wake up after knee surgery  . Anemia   . Cardiomyopathy, nonischemic     EF 45 %  . Endometrial hyperplasia with atypia 05/05/12  . Diastolic HF (heart failure) 11/05/2012  . A-fib   . Echocardiogram abnormal 07/15/12    severe MR,mod to severe TR,mod to severe PA hypertension  . Pulmonary arterial hypertension   . PAF (paroxysmal atrial fibrillation), 1st episode since DCCV in Jan/14 03/19/2013  . Ulcers of both lower extremities, small, now with cellulitis 06/05/2013    Past Surgical History  Procedure Laterality Date  . Cholecystectomy    . Pacemaker placement  02/26/11    Medtronic Adapta  . Knee surgery    . Resection duplicate left kidney    . 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 Hernia repair  1999    ruptured ;  . D& c with hysteroscopy & cervical polypectomy  05/05/12  . Cardioversion N/A 02/06/2013    Successful  . Iud removal  01/30/13  . Ectopic pregnancy surgery  1978  . Cardioversion N/A 06/08/2013    Procedure: CARDIOVERSION;  Surgeon: Sanda Klein, MD;  Location: [place];  Service: Cardiovascular;  Laterality: N/A;  Room [phone]  . Cardioversion N/A 02/01/2014  Procedure: CARDIOVERSION;  Surgeon: Sanda Klein, MD;  Location: [facility] ENDOSCOPY;  Service: Cardiovascular;  Laterality: N/A;  . Cardioversion N/A 06/02/2014    Procedure: CARDIOVERSION;  Surgeon: Sanda Klein, MD;  Location: [facility] ENDOSCOPY;  Service: Cardiovascular;  Laterality: N/A;    Family History  Problem Relation Age of Onset  . Cardiomyopathy Brother   . Diabetes Mother   . Cancer Maternal Grandmother   . Heart attack Mother     Social History:  reports that she has never smoked. She has never used smokeless tobacco. She reports that she does not drink alcohol or use illicit drugs.  Allergies:  Allergies  Allergen  Reactions  . Ivp Dye [Iodinated Diagnostic Agents] Shortness Of Breath    Turn red, can't breathe  . Betadine [Povidone Iodine]   . Diphenhydramine Hcl Swelling    In hands and eyes  . Fish Allergy Nausea And Vomiting  . Iohexol      Desc: PT TURNS RED AND WHEEZING   . Penicillins Other (See Comments)    Resp arrest as child  . Povidone-Iodine Other (See Comments)    Wheezing, turn red, can't breath, and blisters  . Promethazine Hcl Other (See Comments)    Low Blood Pressure  . Tape     Blisters, can use paper tape for short periods  . Clindamycin Hives and Rash    wheezing  . Iodine Rash  . Morphine Sulfate Nausea And Vomiting and Rash    Medications: Scheduled Meds: . amiodarone  400 mg Oral BID  . calcitRIOL  0.25 mcg Oral BID  . calcium carbonate  1 tablet Oral QID   And  . cholecalciferol  200 Units Oral QID  . febuxostat  80 mg Oral Daily  . gabapentin  100 mg Oral BID  . imipenem-cilastatin  250 mg Intravenous 4 times per day  . insulin aspart  0-15 Units Subcutaneous TID WC  . insulin glargine  28 Units Subcutaneous QHS  . iron polysaccharides  150 mg Oral BID  . levothyroxine  125 mcg Oral QAC breakfast  . linagliptin  5 mg Oral Daily  . [START ON 07/29/2014] metolazone  2.5 mg Oral Once  . metoprolol tartrate  37.5 mg Oral BID  . multivitamin with minerals  1 tablet Oral Daily  . mupirocin ointment   Topical BID  . nitroGLYCERIN  0.4 mg Transdermal QODAY  . potassium chloride SA  20 mEq Oral QID  . sodium chloride  3 mL Intravenous Q12H  . torsemide  100 mg Oral Q breakfast  . torsemide  50 mg Oral q1800  . Vitamin D (Ergocalciferol)  50,000 Units Oral Once per day on Mon Thu  . warfarin  7.5 mg Oral ONCE-1800  . Warfarin - Pharmacist Dosing Inpatient   Does not apply q1800   Continuous Infusions:  PRN Meds:.sodium chloride, acetaminophen, acetaminophen, albuterol, ALPRAZolam, guaiFENesin-dextromethorphan, nitroGLYCERIN, ondansetron (ZOFRAN) IV,  ondansetron, oxyCODONE-acetaminophen, sodium chloride   Results for orders placed during the hospital encounter of 07/23/14 (from the past 48 hour(s))  GLUCOSE, CAPILLARY     Status: Abnormal   Collection Time    07/26/14  4:41 PM      Result Value Ref Range   Glucose-Capillary 125 (*) 70 - 99 mg/dL  GLUCOSE, CAPILLARY     Status: Abnormal   Collection Time    07/26/14  9:24 PM      Result Value Ref Range   Glucose-Capillary 122 (*) 70 - 99 mg/dL  PROTIME-INR  Status: Abnormal   Collection Time    07/27/14  4:38 AM      Result Value Ref Range   Prothrombin Time 22.3 (*) 11.6 - 15.2 seconds   INR 1.96 (*) 0.00 - 1.49  CBC     Status: Abnormal   Collection Time    07/27/14  4:38 AM      Result Value Ref Range   WBC 4.8  4.0 - 10.5 K/uL   RBC 2.78 (*) 3.87 - 5.11 MIL/uL   Hemoglobin 8.5 (*) 12.0 - 15.0 g/dL   HCT 26.9 (*) 36.0 - 46.0 %   MCV 96.8  78.0 - 100.0 fL   MCH 30.6  26.0 - 34.0 pg   MCHC 31.6  30.0 - 36.0 g/dL   RDW 18.2 (*) 11.5 - 15.5 %   Platelets 171  150 - 400 K/uL  BASIC METABOLIC PANEL     Status: Abnormal   Collection Time    07/27/14  4:38 AM      Result Value Ref Range   Sodium 135 (*) 137 - 147 mEq/L   Potassium 4.0  3.7 - 5.3 mEq/L   Chloride 94 (*) 96 - 112 mEq/L   CO2 27  19 - 32 mEq/L   Glucose, Bld 131 (*) 70 - 99 mg/dL   BUN 87 (*) 6 - 23 mg/dL   Creatinine, Ser 2.72 (*) 0.50 - 1.10 mg/dL   Calcium 9.0  8.4 - 10.5 mg/dL   GFR calc non Af Amer 17 (*) >90 mL/min   GFR calc Af Amer 20 (*) >90 mL/min   Comment: (NOTE)     The eGFR has been calculated using the CKD EPI equation.     This calculation has not been validated in all clinical situations.     eGFR's persistently <90 mL/min signify possible Chronic Kidney     Disease.   Anion gap 14  5 - 15  GLUCOSE, CAPILLARY     Status: Abnormal   Collection Time    07/27/14  7:37 AM      Result Value Ref Range   Glucose-Capillary 144 (*) 70 - 99 mg/dL  GLUCOSE, CAPILLARY     Status: Abnormal    Collection Time    07/27/14 11:12 AM      Result Value Ref Range   Glucose-Capillary 147 (*) 70 - 99 mg/dL  CULTURE, BLOOD (ROUTINE X 2)     Status: None   Collection Time    07/27/14 11:15 AM      Result Value Ref Range   Specimen Description BLOOD LEFT ARM     Special Requests BOTTLES DRAWN AEROBIC AND ANAEROBIC 10CC EACH     Culture  Setup Time       Value: 07/27/2014 13:54     Performed at [facility]   Culture       Value:        BLOOD CULTURE RECEIVED NO GROWTH TO DATE CULTURE WILL BE HELD FOR 5 DAYS BEFORE ISSUING A FINAL NEGATIVE REPORT     Performed at [facility]   Report Status PENDING    CULTURE, BLOOD (ROUTINE X 2)     Status: None   Collection Time    07/27/14 11:20 AM      Result Value Ref Range   Specimen Description BLOOD RIGHT ARM     Special Requests BOTTLES DRAWN AEROBIC AND ANAEROBIC 10CC EACH     Culture  Setup Time  Value: 07/27/2014 13:54     Performed at [facility]   Culture       Value:        BLOOD CULTURE RECEIVED NO GROWTH TO DATE CULTURE WILL BE HELD FOR 5 DAYS BEFORE ISSUING A FINAL NEGATIVE REPORT     Performed at [facility]   Report Status PENDING    GLUCOSE, CAPILLARY     Status: Abnormal   Collection Time    07/27/14  4:56 PM      Result Value Ref Range   Glucose-Capillary 158 (*) 70 - 99 mg/dL  PROTIME-INR     Status: Abnormal   Collection Time    07/28/14  4:48 AM      Result Value Ref Range   Prothrombin Time 20.5 (*) 11.6 - 15.2 seconds   INR 1.76 (*) 0.00 - 1.49  CBC     Status: Abnormal   Collection Time    07/28/14  4:48 AM      Result Value Ref Range   WBC 6.2  4.0 - 10.5 K/uL   RBC 3.05 (*) 3.87 - 5.11 MIL/uL   Hemoglobin 9.4 (*) 12.0 - 15.0 g/dL   HCT 29.7 (*) 36.0 - 46.0 %   MCV 97.4  78.0 - 100.0 fL   MCH 30.8  26.0 - 34.0 pg   MCHC 31.6  30.0 - 36.0 g/dL   RDW 18.4 (*) 11.5 - 15.5 %   Platelets 221  150 - 400 K/uL  BASIC METABOLIC PANEL     Status: Abnormal   Collection  Time    07/28/14  4:48 AM      Result Value Ref Range   Sodium 134 (*) 137 - 147 mEq/L   Potassium 4.2  3.7 - 5.3 mEq/L   Chloride 92 (*) 96 - 112 mEq/L   CO2 29  19 - 32 mEq/L   Glucose, Bld 174 (*) 70 - 99 mg/dL   BUN 91 (*) 6 - 23 mg/dL   Creatinine, Ser 2.80 (*) 0.50 - 1.10 mg/dL   Calcium 9.3  8.4 - 10.5 mg/dL   GFR calc non Af Amer 17 (*) >90 mL/min   GFR calc Af Amer 19 (*) >90 mL/min   Comment: (NOTE)     The eGFR has been calculated using the CKD EPI equation.     This calculation has not been validated in all clinical situations.     eGFR's persistently <90 mL/min signify possible Chronic Kidney     Disease.   Anion gap 13  5 - 15  GLUCOSE, CAPILLARY     Status: Abnormal   Collection Time    07/28/14  7:22 AM      Result Value Ref Range   Glucose-Capillary 170 (*) 70 - 99 mg/dL  VANCOMYCIN, RANDOM     Status: None   Collection Time    07/28/14 11:06 AM      Result Value Ref Range   Vancomycin Rm 23.2     Comment:            Random Vancomycin therapeutic     range is dependent on dosage and     time of specimen collection.     A peak range is 20.0-40.0 ug/mL     A trough range is 5.0-15.0 ug/mL             GLUCOSE, CAPILLARY     Status: Abnormal   Collection Time    07/28/14 11:46 AM  Result Value Ref Range   Glucose-Capillary 166 (*) 70 - 99 mg/dL    Ct Extrem Lower Wo Cm Bil  07/27/2014   CLINICAL DATA:  Open wounds involving both distal thighs medially. Evaluate cellulitis.  EXAM: CT OF THE LOWER BILATERAL EXTREMITY WITHOUT CONTRAST  TECHNIQUE: Multidetector CT imaging of both thighs was performed according to the standard protocol. Images extend from the lower pelvis through the proximal lower legs. No contrast was administered.  COMPARISON:  None.  FINDINGS: There is no evidence of acute fracture or dislocation. There are no significant arthropathic changes at either hip. There are advanced tricompartmental degenerative changes at both knees with large  osteophytes. There are possible intra-articular loose bodies, especially on the right.  There is diffuse muscular atrophy in both thighs. Multiple superficial varicosities are present anteriorly within the right thigh. There is diffuse subcutaneous edema throughout both thighs, especially distally. The soft tissues are incompletely visualized due to the patient's body habitus. There is an area of skin ulceration medially in the distal left thigh, best seen on axial image number 98. There is some adjacent soft tissue emphysema. No underlying focal fluid collection or foreign body is seen.  IMPRESSION: 1. Diffuse soft tissue edema within both thighs consistent with cellulitis. There is an area of skin ulceration medially in the distal left thigh with mild adjacent soft tissue emphysema. No focal underlying fluid collection is identified to suggest an abscess. 2. Multiple varicosities are present within the right thigh. 3. No evidence of osteomyelitis. 4. 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   Electronically Signed   By: Camie Patience M.D.   On: 07/27/2014 16:30    Review of Systems  All other systems reviewed and are negative.  Blood pressure 88/49, pulse 72, temperature 97.5 F (36.4 C), temperature source Oral, resp. rate 18, height $RemoveBe'5\' 6"'eHTDqmCCY$  (1.676 m), weight 331 lb (150.141 kg), SpO2 100.00%. Physical Exam  Constitutional: She is oriented to person, place, and time. She appears well-developed and well-nourished. No distress.  obese  HENT:  Head: Normocephalic and atraumatic.  Cardiovascular: Normal rate, regular rhythm, normal heart sounds and intact distal pulses.  Exam reveals no gallop and no friction rub.   No murmur heard. Respiratory: Effort normal and breath sounds normal. No respiratory distress. She has no wheezes. She has no rales. She exhibits no tenderness.  GI: Soft. Bowel sounds are normal. She exhibits no distension and no mass. There is no tenderness. There is no  rebound and no guarding.  Musculoskeletal: She exhibits edema and tenderness.  Neurological: She is alert and oriented to person, place, and time.  Skin: She is not diaphoretic.  Left medial thigh wound- 3x2x2cm with some necrosis, surrounding erythema, there are no areas of fluctuance to suggest abscess  Right medial thigh-3x2cm areas of necrosis, no drainage or surrounding erythema. There are scattered blisters and superficial wounds.  There are no palpable areas or fluctuance.  There is moderate erythema and swelling to BLE  Psychiatric: She has a normal mood and affect. Her behavior is normal. Judgment and thought content normal.    Assessment/Plan: CHF PAF on coumadin(INR 1.7) CKD Diabetes mellitus Obesity LE lymphedema   Bacteroides fragilis bacteremia LLE wounds/cellulitis  -Hydrotherapy to right and left medial thigh wounds that are necrotic.  There is no need for surgical intervention at this time and no evident abscesses on exam or CT scan.  I suspect this will clean up with hydrotherapy.  However, if no improvement we will do  bedside debridement.   -WOC consult for wound care -BID wet to dry dressing change to left medial thigh -antibiotics per primary service/ID -will follow along  Dontarious Schaum ANP-BC 07/28/2014, 3:01 PM

## 2014-07-28 NOTE — Progress Notes (Addendum)
ANTIBIOTIC CONSULT NOTE - FOLLOW UP  Pharmacy Consult for Primaxin Indication: Cellulitis/Bactermia  Allergies  Allergen Reactions  . Ivp Dye [Iodinated Diagnostic Agents] Shortness Of Breath    Turn red, can't breathe  . Betadine [Povidone Iodine]   . Diphenhydramine Hcl Swelling    In hands and eyes  . Fish Allergy Nausea And Vomiting  . Iohexol      Desc: PT TURNS RED AND WHEEZING   . Penicillins Other (See Comments)    Resp arrest as child  . Povidone-Iodine Other (See Comments)    Wheezing, turn red, can't breath, and blisters  . Promethazine Hcl Other (See Comments)    Low Blood Pressure  . Tape     Blisters, can use paper tape for short periods  . Clindamycin Hives and Rash    wheezing  . Iodine Rash  . Morphine Sulfate Nausea And Vomiting and Rash    Patient Measurements: Height: 5\' 6"  (167.6 cm) Weight: 331 lb (150.141 kg) IBW/kg (Calculated) : 59.3  Vital Signs: Temp: 98.1 F (36.7 C) (07/29 0438) Temp src: Oral (07/29 0438) BP: 109/71 mmHg (07/29 0438) Pulse Rate: 69 (07/29 0438) Intake/Output from previous day: 07/28 0701 - 07/29 0700 In: 680 [P.O.:480; IV Piggyback:200] Out: 1000 [Urine:1000] Intake/Output from this shift: Total I/O In: 240 [P.O.:240] Out: 600 [Urine:600]  Labs:  Recent Labs  07/26/14 0400 07/27/14 0438 07/28/14 0448  WBC 4.6 4.8 6.2  HGB 8.6* 8.5* 9.4*  PLT 154 171 221  CREATININE 2.68* 2.72* 2.80*   Estimated Creatinine Clearance: 30.2 ml/min (by C-G formula based on Cr of 2.8).  26 ml/min/1.46m2 (normalized)   Recent Labs  07/25/14 2152  [facility] 21.7     Microbiology: Recent Results (from the past 720 hour(s))  CULTURE, BLOOD (ROUTINE X 2)     Status: None   Collection Time    07/23/14  6:35 PM      Result Value Ref Range Status   Specimen Description BLOOD LEFT ANTECUBITAL   Final   Special Requests BOTTLES DRAWN AEROBIC AND ANAEROBIC 5ML   Final   Culture  Setup Time     Final   Value: 07/23/2014  22:16     Performed at [facility]   Culture     Final   Value: BACTEROIDES FRAGILIS     Note: BETA LACTAMASE POSITIVE     Note: Gram Stain Report Called to,Read Back By and Verified With: Rulon Abide RN on 07/24/14 at 20:45 by Rise Mu     Performed at [facility]   Report Status 07/26/2014 FINAL   Final  CULTURE, BLOOD (ROUTINE X 2)     Status: None   Collection Time    07/23/14  6:38 PM      Result Value Ref Range Status   Specimen Description BLOOD RIGHT ANTECUBITAL   Final   Special Requests BOTTLES DRAWN AEROBIC AND ANAEROBIC 5ML   Final   Culture  Setup Time     Final   Value: 07/23/2014 22:16     Performed at [facility]   Culture     Final   Value: BACTEROIDES FRAGILIS     Note: CRITICAL RESULT CALLED TO, READ BACK BY AND VERIFIED WITH: [place] RN @1015PM  VINCJ [id number] BETA LACTAMASE POSITIVE     Performed at [facility]   Report Status 07/26/2014 FINAL   Final  WOUND CULTURE     Status: None   Collection Time    07/24/14  3:44 PM      Result Value Ref Range Status   Specimen Description LEG   Final   Special Requests Normal   Final   Gram Stain     Final   Value: RARE SQUAMOUS EPITHELIAL CELLS PRESENT     RARE WBC PRESENT, PREDOMINANTLY PMN     FEW GRAM POSITIVE COCCI IN PAIRS     FEW GRAM NEGATIVE RODS     Performed at [facility]   Culture     Final   Value: FEW STAPHYLOCOCCUS AUREUS     Note: RIFAMPIN AND GENTAMICIN SHOULD NOT BE USED AS SINGLE DRUGS FOR TREATMENT OF STAPH INFECTIONS.     Performed at [facility]   Report Status PENDING   Incomplete   Assessment: 64 y.o. 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 Patient was placed on empiric  vanco and levaquin upon admission. 7/24 blood cultures positive for Bacteroides fragilis, pharmacy consulted to dose Primaxin. 7/29 wound cultures positive for staph aureus, pharmacy consulted to restart vanc.  7/24 >> vancomycin >> 7/28 7/24 >> levofloxacin >> 7/28 7/27 >> flagyl (MD)>>7/28 7/28 >> primaxin >>  Tmax: afeb WBCs: WNL CKD4 (solitary kidney): SCr 2.8 (rising), CrCl 22N  7/24 blood: bacteroides fragilis (beta-lactamase positive) 7/24 urine: ordered (UA neg) 7/25 leg wound (after abx started): staph aureus  Goal of Therapy:  Vancomycin trough level 15-20 Appropriate antibiotic dosing for renal function; eradication of infection  Plan:   Continue primaxin 250mg  IV Q6H  Obtain STAT random vanc level due to renal function. Last dose was Vanco 2g IV q48H on 7/27 1100. Pt likely still has vanc at therapeutic levels.   Follow renal function  F/u on wound culture  Kizzie Furnish, PharmD Pager: [phone] 07/28/2014 10:33 AM    Addendum: 7/29 1100 Random Vancomycin level = 23.2 (SUPRAtherapeutic)  Plan:  Hold Vancomycin for now due to supratherapeutic level  Obtain Vancomycin level in am, order dose if < 20  Continue to follow renal function.  Kizzie Furnish, PharmD Pager: [phone] 07/28/2014 12:37 PM

## 2014-07-29 DIAGNOSIS — N058 Unspecified nephritic syndrome with other morphologic changes: Secondary | ICD-10-CM

## 2014-07-29 DIAGNOSIS — A4901 Methicillin susceptible Staphylococcus aureus infection, unspecified site: Secondary | ICD-10-CM

## 2014-07-29 LAB — GLUCOSE, CAPILLARY
GLUCOSE-CAPILLARY: 159 mg/dL — AB (ref 70–99)
GLUCOSE-CAPILLARY: 141 mg/dL — AB (ref 70–99)
Glucose-Capillary: 166 mg/dL — ABNORMAL HIGH (ref 70–99)
Glucose-Capillary: 144 mg/dL — ABNORMAL HIGH (ref 70–99)
Glucose-Capillary: 169 mg/dL — ABNORMAL HIGH (ref 70–99)

## 2014-07-29 LAB — PROTIME-INR
INR: 1.63 — ABNORMAL HIGH (ref 0.00–1.49)
Prothrombin Time: 19.3 seconds — ABNORMAL HIGH (ref 11.6–15.2)

## 2014-07-29 LAB — CBC
HEMATOCRIT: 29.1 % — AB (ref 36.0–46.0)
HEMOGLOBIN: 9.4 g/dL — AB (ref 12.0–15.0)
MCH: 30.7 pg (ref 26.0–34.0)
MCHC: 32.3 g/dL (ref 30.0–36.0)
MCV: 95.1 fL (ref 78.0–100.0)
Platelets: 240 10*3/uL (ref 150–400)
RBC: 3.06 MIL/uL — ABNORMAL LOW (ref 3.87–5.11)
RDW: 18.5 % — AB (ref 11.5–15.5)
WBC: 6.4 10*3/uL (ref 4.0–10.5)

## 2014-07-29 LAB — BASIC METABOLIC PANEL
Anion gap: 13 (ref 5–15)
BUN: 92 mg/dL — AB (ref 6–23)
CHLORIDE: 94 meq/L — AB (ref 96–112)
CO2: 27 mEq/L (ref 19–32)
CREATININE: 2.76 mg/dL — AB (ref 0.50–1.10)
Calcium: 9.3 mg/dL (ref 8.4–10.5)
GFR calc non Af Amer: 17 mL/min — ABNORMAL LOW (ref >90)
GFR, EST AFRICAN AMERICAN: 20 mL/min — AB (ref >90)
Glucose, Bld: 146 mg/dL — ABNORMAL HIGH (ref 70–99)
Potassium: 4.1 mEq/L (ref 3.7–5.3)
Sodium: 134 mEq/L — ABNORMAL LOW (ref 137–147)

## 2014-07-29 LAB — VANCOMYCIN, TROUGH: VANCOMYCIN TR: 19.5 ug/mL (ref 10.0–20.0)

## 2014-07-29 LAB — WOUND CULTURE: SPECIAL REQUESTS: NORMAL

## 2014-07-29 MED ORDER — WARFARIN SODIUM 7.5 MG PO TABS
7.5000 mg | ORAL_TABLET | Freq: Once | ORAL | Status: AC
  Administered 2014-07-29: 7.5 mg via ORAL
  Filled 2014-07-29: qty 1

## 2014-07-29 MED ORDER — VANCOMYCIN HCL 10 G IV SOLR
1500.0000 mg | Freq: Once | INTRAVENOUS | Status: AC
  Administered 2014-07-29: 1500 mg via INTRAVENOUS
  Filled 2014-07-29: qty 1500

## 2014-07-29 MED ORDER — SACCHAROMYCES BOULARDII 250 MG PO CAPS
250.0000 mg | ORAL_CAPSULE | Freq: Two times a day (BID) | ORAL | Status: DC
  Administered 2014-07-29 – 2014-08-03 (×11): 250 mg via ORAL
  Filled 2014-07-29 (×12): qty 1

## 2014-07-29 MED ORDER — LIDOCAINE-EPINEPHRINE 2 %-1:100000 IJ SOLN
20.0000 mL | Freq: Once | INTRAMUSCULAR | Status: AC
  Administered 2014-07-29: 20 mL via INTRADERMAL
  Filled 2014-07-29 (×2): qty 20

## 2014-07-29 MED ORDER — MUPIROCIN 2 % EX OINT
TOPICAL_OINTMENT | CUTANEOUS | Status: AC
  Filled 2014-07-29: qty 22

## 2014-07-29 MED ORDER — COLLAGENASE 250 UNIT/GM EX OINT
TOPICAL_OINTMENT | Freq: Every day | CUTANEOUS | Status: DC
  Administered 2014-07-29 – 2014-08-03 (×6): via TOPICAL
  Filled 2014-07-29 (×2): qty 30

## 2014-07-29 NOTE — Progress Notes (Signed)
Agree with above.  Pack left thigh wound, silvadene to other wounds.

## 2014-07-29 NOTE — Progress Notes (Signed)
Physical Therapy Hydrotherapy Evaluation   07/29/14 1300  Subjective Assessment  Subjective Pt pleasant and agreeable to hydrotherapy.  Surgical PA in to see L and R medial thigh wounds after pulsatile lavage and plans to debride at bedside later today.  Plan to continue hydrotherapy for L medial thigh wound and adding santyl per PA.  Patient and Family Stated Goals healing LE wounds  Evaluation and Treatment  Evaluation and Treatment Procedures Explained to Patient/Family Yes  Evaluation and Treatment Procedures agreed to  Wound / Incision (Open or Dehisced) 07/29/14 Other (Comment) Thigh Left;Medial HYDRO  Date First Assessed/Time First Assessed: 07/29/14 1125   Wound Type: (c) Other (Comment)  Location: Thigh  Location Orientation: Left;Medial  Wound Description (Comments): HYDRO  Present on Admission: Yes  Dressing Type Transparent dressing;Moist to dry (tegaderm due to pt unable to tolerate any tape on skin)  Dressing Changed Changed  Dressing Status Intact;Old drainage  Dressing Change Frequency Twice a day (per orders)  Site / Wound Assessment Black;Brown  % Wound base Red or Granulating 5%  % Wound base Black 95%  Peri-wound Assessment Erythema (non-blanchable)  Wound Length (cm) 2.5 cm  Wound Width (cm) 3 cm  Wound Depth (cm) 2 cm  Margins Epibole (rolled edges)  Drainage Amount Moderate  Drainage Description Serosanguineous  Non-staged Wound Description Full thickness  Treatment Hydrotherapy (Pulse lavage);Packing (Saline gauze)  Hydrotherapy  Pulsed Lavage with Suction (psi) 8 psi  Pulsed Lavage with Suction - Normal Saline Used 1000 mL  Pulsed Lavage Tip Tip with splash shield  Pulsed lavage therapy - wound location L medial thigh (also performed to R medial thigh wound this once)  Wound Therapy - Assess/Plan/Recommendations  Wound Therapy - Clinical Statement Pt would benefit from hydrotherapy to assist with removing necrotic tissue and promote wound healing  environment.  Factors Delaying/Impairing Wound Healing Diabetes Mellitus;Immobility;Multiple medical problems  Hydrotherapy Plan Debridement;Dressing change;Patient/family education;Pulsatile lavage with suction  Wound Therapy - Frequency 6X / week  Wound Therapy - Follow Up Recommendations Benjamin  Wound Plan To perform hydrotherapy to L medial open thigh wound to remove necrotic tissue and facilitate wound healing.  Wound Therapy Goals - Improve the function of patient's integumentary system by progressing the wound(s) through the phases of wound healing by:  Decrease Necrotic Tissue to 50%  Decrease Necrotic Tissue - Progress Goal set today  Increase Granulation Tissue to 50%  Increase Granulation Tissue - Progress Goal set today  Improve Drainage Characteristics Min  Improve Drainage Characteristics - Progress Goal set today  Goals/treatment plan/discharge plan were made with and agreed upon by patient/family Yes  Time For Goal Achievement 2 weeks  Wound Therapy - Potential for Goals Good   Carmelia Bake, PT, DPT 07/29/2014 Pager: [phone]

## 2014-07-29 NOTE — Progress Notes (Addendum)
INFECTIOUS DISEASE PROGRESS NOTE  ID: [name] is a 64 y.o. female with  Active Problems:   OBESITY, MORBID   HYPERTENSION   PACEMAKER, PERMANENT   Obesity hypoventilation syndrome   Hypothyroidism, acquired, autoimmune   Solitary kidney, acquired   Cardiomyopathy, nonischemic- EF 30-35% June 2014   Type 2 diabetes mellitus   CKD (chronic kidney disease) stage 4, GFR 15-29 ml/min   PAF (paroxysmal atrial fibrillation)   Hypocalcemia   Cellulitis and abscess  Subjective: Without complaint.   Abtx:  Anti-infectives   Start     Dose/Rate Route Frequency Ordered Stop   07/29/14 1000  vancomycin (VANCOCIN) 1,500 mg in sodium chloride 0.9 % 500 mL IVPB     1,500 mg 250 mL/hr over 120 Minutes Intravenous  Once 07/29/14 0541 07/29/14 1238   07/27/14 1200  imipenem-cilastatin (PRIMAXIN) 250 mg in sodium chloride 0.9 % 100 mL IVPB     250 mg 200 mL/hr over 30 Minutes Intravenous 4 times per day 07/27/14 1103     07/27/14 0200  metroNIDAZOLE (FLAGYL) IVPB 500 mg  Status:  Discontinued     500 mg 100 mL/hr over 60 Minutes Intravenous Every 6 hours 07/26/14 1859 07/27/14 1247   07/26/14 1900  metroNIDAZOLE (FLAGYL) IVPB 500 mg     500 mg 100 mL/hr over 60 Minutes Intravenous STAT 07/26/14 1858 07/26/14 2013   07/26/14 1000  vancomycin (VANCOCIN) 2,000 mg in sodium chloride 0.9 % 500 mL IVPB  Status:  Discontinued     2,000 mg 250 mL/hr over 120 Minutes Intravenous Every 48 hours 07/26/14 0445 07/27/14 1040   07/24/14 1000  vancomycin (VANCOCIN) 2,000 mg in sodium chloride 0.9 % 500 mL IVPB  Status:  Discontinued     2,000 mg 250 mL/hr over 120 Minutes Intravenous Every 24 hours 07/23/14 1914 07/25/14 0914   07/23/14 2200  levofloxacin (LEVAQUIN) IVPB 750 mg  Status:  Discontinued     750 mg 100 mL/hr over 90 Minutes Intravenous Every 48 hours 07/23/14 2059 07/27/14 1040   07/23/14 1930  vancomycin (VANCOCIN) 2,000 mg in sodium chloride 0.9 % 500 mL IVPB     2,000 mg 250  mL/hr over 120 Minutes Intravenous NOW 07/23/14 1842 07/23/14 2111      Medications:  Scheduled: . amiodarone  400 mg Oral BID  . calcitRIOL  0.25 mcg Oral BID  . calcium carbonate  1 tablet Oral QID   And  . cholecalciferol  200 Units Oral QID  . febuxostat  80 mg Oral Daily  . gabapentin  100 mg Oral BID  . imipenem-cilastatin  250 mg Intravenous 4 times per day  . insulin aspart  0-15 Units Subcutaneous TID WC  . insulin glargine  28 Units Subcutaneous QHS  . iron polysaccharides  150 mg Oral BID  . levothyroxine  125 mcg Oral QAC breakfast  . lidocaine-EPINEPHrine  20 mL Intradermal Once  . linagliptin  5 mg Oral Daily  . metoprolol tartrate  12.5 mg Oral BID  . multivitamin with minerals  1 tablet Oral Daily  . mupirocin ointment   Topical BID  . nitroGLYCERIN  0.4 mg Transdermal QODAY  . potassium chloride SA  20 mEq Oral QID  . saccharomyces boulardii  250 mg Oral BID  . sodium chloride  3 mL Intravenous Q12H  . Vitamin D (Ergocalciferol)  50,000 Units Oral Once per day on Mon Thu  . warfarin  7.5 mg Oral ONCE-1800  . Warfarin - Pharmacist Dosing  Inpatient   Does not apply q1800    Objective: Vital signs in last 24 hours: Temp:  [97.5 F (36.4 C)-98.1 F (36.7 C)] 98.1 F (36.7 C) (07/30 0600) Pulse Rate:  [65-74] 65 (07/30 0600) Resp:  [18] 18 (07/30 0600) BP: (88-94)/(49-50) 94/50 mmHg (07/30 0600) SpO2:  [96 %-100 %] 96 % (07/30 0600) Weight:  [150.367 kg (331 lb 8 oz)] 150.367 kg (331 lb 8 oz) (07/30 0441)   General appearance: alert, cooperative and no distress Extremities: edema massive.  and LE venous insufficiency. wound on L leg is being debrided at bedside. packed. clean.   Lab Results  Recent Labs  07/28/14 0448 07/29/14 0435  WBC 6.2 6.4  HGB 9.4* 9.4*  HCT 29.7* 29.1*  NA 134* 134*  K 4.2 4.1  CL 92* 94*  CO2 29 27  BUN 91* 92*  CREATININE 2.80* 2.76*   Liver Panel No results found for this basename: PROT, ALBUMIN, AST, ALT,  ALKPHOS, BILITOT, BILIDIR, IBILI,  in the last 72 hours Sedimentation Rate No results found for this basename: ESRSEDRATE,  in the last 72 hours C-Reactive Protein No results found for this basename: CRP,  in the last 72 hours  Microbiology: Recent Results (from the past 240 hour(s))  CULTURE, BLOOD (ROUTINE X 2)     Status: None   Collection Time    07/23/14  6:35 PM      Result Value Ref Range Status   Specimen Description BLOOD LEFT ANTECUBITAL   Final   Special Requests BOTTLES DRAWN AEROBIC AND ANAEROBIC 5ML   Final   Culture  Setup Time     Final   Value: 07/23/2014 22:16     Performed at [facility]   Culture     Final   Value: BACTEROIDES FRAGILIS     Note: BETA LACTAMASE POSITIVE     Note: Gram Stain Report Called to,Read Back By and Verified With: Rulon Abide RN on 07/24/14 at 20:45 by Rise Mu     Performed at [facility]   Report Status 07/26/2014 FINAL   Final  CULTURE, BLOOD (ROUTINE X 2)     Status: None   Collection Time    07/23/14  6:38 PM      Result Value Ref Range Status   Specimen Description BLOOD RIGHT ANTECUBITAL   Final   Special Requests BOTTLES DRAWN AEROBIC AND ANAEROBIC 5ML   Final   Culture  Setup Time     Final   Value: 07/23/2014 22:16     Performed at [facility]   Culture     Final   Value: BACTEROIDES FRAGILIS     Note: CRITICAL RESULT CALLED TO, READ BACK BY AND VERIFIED WITH: JENNY BURNS RN @1015PM  [place] [id number] BETA LACTAMASE POSITIVE     Performed at [facility]   Report Status 07/26/2014 FINAL   Final  WOUND CULTURE     Status: None   Collection Time    07/24/14  3:44 PM      Result Value Ref Range Status   Specimen Description LEG   Final   Special Requests Normal   Final   Gram Stain     Final   Value: RARE SQUAMOUS EPITHELIAL CELLS PRESENT     RARE WBC PRESENT, PREDOMINANTLY PMN     FEW GRAM POSITIVE COCCI IN PAIRS     FEW GRAM NEGATIVE RODS     Performed at [facility]  Final   Value: FEW STAPHYLOCOCCUS AUREUS     Note: RIFAMPIN AND GENTAMICIN SHOULD NOT BE USED AS SINGLE DRUGS FOR TREATMENT OF STAPH INFECTIONS.     Performed at [facility]   Report Status 07/29/2014 FINAL   Final   Organism ID, Bacteria STAPHYLOCOCCUS AUREUS   Final  CULTURE, BLOOD (ROUTINE X 2)     Status: None   Collection Time    07/27/14 11:15 AM      Result Value Ref Range Status   Specimen Description BLOOD LEFT ARM   Final   Special Requests BOTTLES DRAWN AEROBIC AND ANAEROBIC 10CC EACH   Final   Culture  Setup Time     Final   Value: 07/27/2014 13:54     Performed at [facility]   Culture     Final   Value:        BLOOD CULTURE RECEIVED NO GROWTH TO DATE CULTURE WILL BE HELD FOR 5 DAYS BEFORE ISSUING A FINAL NEGATIVE REPORT     Performed at [facility]   Report Status PENDING   Incomplete  CULTURE, BLOOD (ROUTINE X 2)     Status: None   Collection Time    07/27/14 11:20 AM      Result Value Ref Range Status   Specimen Description BLOOD RIGHT ARM   Final   Special Requests BOTTLES DRAWN AEROBIC AND ANAEROBIC 10CC EACH   Final   Culture  Setup Time     Final   Value: 07/27/2014 13:54     Performed at [facility]   Culture     Final   Value:        BLOOD CULTURE RECEIVED NO GROWTH TO DATE CULTURE WILL BE HELD FOR 5 DAYS BEFORE ISSUING A FINAL NEGATIVE REPORT     Performed at [facility]   Report Status PENDING   Incomplete    Studies/Results: Ct Abdomen Pelvis Wo Contrast  07/28/2014   CLINICAL DATA:  Right side abdominal pain, bacteria in urine  EXAM: CT ABDOMEN AND PELVIS WITHOUT CONTRAST  TECHNIQUE: Multidetector CT imaging of the abdomen and pelvis was performed following the standard protocol without IV contrast.  COMPARISON:  01/22/2011  FINDINGS: Lung bases are unremarkable. Sagittal images of the spine shows disc space flattening with vacuum disc phenomenon at L5-S1 level. Mild disc space flattening with posterior disc  bulge at L4-L5 level. Unenhanced liver shows no biliary ductal dilatation. The patient is status postcholecystectomy. The pancreas, spleen and adrenal glands are unremarkable. Unenhanced kidneys shows no nephrolithiasis. The left kidney is smaller in size. Probable postsurgical changes lower pole of the left kidney. There is a cyst in inferior aspect of the left kidney measures 1.3 cm. No hydronephrosis or hydroureter. No calcified ureteral calculi.  Moderate colonic stool. Oral contrast was given to the patient. No aortic aneurysm. No small bowel obstruction. The terminal ileum is unremarkable. Moderate distended urinary bladder. There is a isodense kidney lesion upper pole of the left kidney measures 1 cm. This cannot be characterized without IV contrast. Further correlation with enhanced CT or MRI is recommended.  Right kidney is mi rotated. Probable cyst in lower pole of the right kidney measures 1.1 cm.  The uterus is unremarkable. No calcified stones are noted within urinary bladder. No inguinal adenopathy. Small bilateral inguinal lymph nodes are noted. No destructive bony lesions are noted within pelvis.  IMPRESSION: 1. No nephrolithiasis.  No hydronephrosis or hydroureter. 2. Isodense exophytic lesion in  upper pole of the left kidney measures 1 cm. Further correlation with enhanced CT or MRI is recommended. Probable postsurgical changes lower pole of the left kidney. There is a cyst in inferior aspect of the left kidney measures 1.3 cm. 3. No calcified ureteral calculi. 4. No small bowel obstruction.  Moderate colonic stool. 5. Moderate distended urinary bladder. 6. Degenerative changes lumbar spine L4-L5 and L5-S1 level.   Electronically Signed   By: Lahoma Crocker M.D.   On: 07/28/2014 16:23   Ct Extrem Lower Wo Cm Bil  07/27/2014   CLINICAL DATA:  Open wounds involving both distal thighs medially. Evaluate cellulitis.  EXAM: CT OF THE LOWER BILATERAL EXTREMITY WITHOUT CONTRAST  TECHNIQUE: Multidetector CT  imaging of both thighs was performed according to the standard protocol. Images extend from the lower pelvis through the proximal lower legs. No contrast was administered.  COMPARISON:  None.  FINDINGS: There is no evidence of acute fracture or dislocation. There are no significant arthropathic changes at either hip. There are advanced tricompartmental degenerative changes at both knees with large osteophytes. There are possible intra-articular loose bodies, especially on the right.  There is diffuse muscular atrophy in both thighs. Multiple superficial varicosities are present anteriorly within the right thigh. There is diffuse subcutaneous edema throughout both thighs, especially distally. The soft tissues are incompletely visualized due to the patient's body habitus. There is an area of skin ulceration medially in the distal left thigh, best seen on axial image number 98. There is some adjacent soft tissue emphysema. No underlying focal fluid collection or foreign body is seen.  IMPRESSION: 1. Diffuse soft tissue edema within both thighs consistent with cellulitis. There is an area of skin ulceration medially in the distal left thigh with mild adjacent soft tissue emphysema. No focal underlying fluid collection is identified to suggest an abscess. 2. Multiple varicosities are present within the right thigh. 3. No evidence of osteomyelitis. 4. 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   Electronically Signed   By: Camie Patience M.D.   On: 07/27/2014 16:30     Assessment/Plan: Bacteroides fragilis bacteremia  LE cellulitis, abscess   Cx MSSA Exophytic lesion L kidney DM2  CHF  CRI  MGUS  Total days of antibiotics: 6 vanco/imipenem  Would stop vanco Plan for 2 weeks of imipenem or invanz if d/c on anbx No PIC due to her renal function (she refuses this).  Primary to w/u kidney lesion Repeat BCx 1 week after completing IV anbx D/i Care Manager available if  questions         Bobby Rumpf Infectious Diseases (pager) [phone] [email or web address] 07/29/2014, 2:51 PM  LOS: 6 days   **Disclaimer: This note may have been dictated with voice recognition software. Similar sounding words can inadvertently be transcribed and this note may contain transcription errors which may not have been corrected upon publication of note.**

## 2014-07-29 NOTE — Progress Notes (Addendum)
[facility] PROGRESS NOTE  [name] DXI:[id number] DOB: [birth year] DOA: 07/23/2014 PCP: Elby Showers, MD  Assessment/Plan  64 y.o. 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  Pt developed a blister at multiple spots in her bilateral lower legs on Tuesday, following which she noted that her legs were very erythematous. Some of the blisters have spontaneously drained, she went to the wound care center where she had a small I&D done on the left lower medial thigh and she was sent to the ED for further evaluation.  1. Cellulitis and abscess of bilateral lower extremity due to severe lymphedema with progressive draining ulcers due to MDR S. aureus.   -  Continue vancomycin  - Continue Imipenem, day 3 -  Appreciate wound care assistance -  CT leg:  Soft tissue edema within both thighs consistent with cellulitis, no focal underlying fluid collection or evidence of osteomyelitis -  Leg [place]: no DVT  -  General surgery consult for possible debridement > may debride some today with local anesthetic  2. Bacteremia/sepsis due to beta-lactamase bacteriodes fragilis -  CT scan abd/pelvis to eval for gut source:  No obvious source of infection - 7/24: blood cultures + GNR (BACTEROIDES FRAGILIS); likely source leg wounds  - 7/28:  blood cultures pending -  Appreciate ID assistance -  Due to CKD, unable to have PICC placed for long-term antibiotics   2. Type 2 diabetes mellitus Ha1c-7.0 (02/2014), CBG improved -  Continue lantus 28 -  Cont SSI -  Hold gliburide  3. PAF (paroxysmal atrial fibrillation), paced rhythm on tele, rate controlled.  Tele d/c'd 7/29 -  Coumadin dosed by pharmacy -  INR subtherapeutic but rising  4. Chronic CHF, systolic HF, with dilated Cardiomyopathy, nonischemic,  compensated -  Echo (2014): LVEF 30-35%, Diffuse Hypokinesis, severe MR, dilated LA, RA, RA, LV, TR, pulmonary HTN -  Continue to hold Demadex due to rising BUN:Cr and mild hypotension -  Daily weights:  Wt stable  Hypotension likely due to dehydration -  Hold diuretics -  Reduce dose of BB (already has hold parameter for BP)  5. Hypothyroidism, acquired, autoimmune Continue levothyroxine   6. Hypocalcemia Continue with calcium, vitamin D supplementation. Monitor lites periodically   7. CKD (chronic kidney disease) stage 4, GFR 15-29 ml/min; h/o nephrectomy, creatinine stable after holding diuretics -  Minimize nephrotoxins and renally dose medications -  Hold diuretics  Left renal exophytic mass.  Unable to perform CT/MRI with contrast due to CKD and [place] unlikely to be helpful given body habitus.   -  Repeat imaging in 3-6 months  8. Obesity hypoventilation syndrome CPAP each bedtime   9. OBESITY, MORBID; cont encourage weigh loss, diet exercise   10. Leg pain, neuropathy -  Continue neurontin, cont prn opioids (tolerated opioids well no reaction)   11. H/o IDA: no acute s/s of bleeding; h/o MGUS -  Iron studies wnl -  Defer to PCP/hematology  Diet:  Healthy heart/diabetic Access:  PIV IVF:  Off Proph:  Coumadin  Code Status: Full Family Communication: patient alone Disposition Plan:  Pending further eval for blood stream infection.   Consultants:  Infectious disease  General surgery  Procedures:  CT lower extremity  Duplex lower extremity  Antibiotics: vanc 7/24 >> 7/28 Levofloxacin 7/24 >>> 7/28  Metronidazole 7/27>>>  7/28  Imipenem 7/28 >>>   HPI/Subjective:  Had some confusion last night.  Denies SOB, nausea, vomiting, diarrhea.  Legs less red.  Objective: Filed Vitals:   07/28/14 1500 07/28/14 1955 07/29/14 0441 07/29/14 0600  BP: 88/49 90/50  94/50  Pulse: 72 74  65  Temp: 97.5 F (36.4 C) 97.9 F (36.6 C)  98.1 F (36.7 C)  TempSrc: Oral  Oral  Oral  Resp: 18 18  18   Height:      Weight:   150.367 kg (331 lb 8 oz)   SpO2: 100% 100%  96%    Intake/Output Summary (Last 24 hours) at 07/29/14 1229 Last data filed at 07/29/14 0900  Gross per 24 hour  Intake    770 ml  Output   1500 ml  Net   -730 ml   Filed Weights   07/27/14 0524 07/28/14 0438 07/29/14 0441  Weight: 149.6 kg (329 lb 12.9 oz) 150.141 kg (331 lb) 150.367 kg (331 lb 8 oz)    Exam:   General:  Obese CF, No acute distress  HEENT:  NCAT, MMM  Cardiovascular:  RRR, nl S1, S2 no mrg, 2+ pulses, warm extremities  Respiratory:  CTAB, no increased WOB  Abdomen:   NABS, soft, NT/ND  MSK:   Normal tone and bulk, bilateral lymphedema.  Necrotic ulcer left leg, multiple ulcers bilateral lower extremities.  Erythema is less pink today and there are some cleared areas.    Neuro:  Grossly intact but diffusely weak.    Data Reviewed: Basic Metabolic Panel:  Recent Labs Lab 07/25/14 0500 07/25/14 2152 07/26/14 0400 07/27/14 0438 07/28/14 0448 07/29/14 0435  NA 134*  --  135* 135* 134* 134*  K 3.6*  --  4.4 4.0 4.2 4.1  CL 92*  --  93* 94* 92* 94*  CO2 28  --  29 27 29 27   GLUCOSE 74  --  123* 131* 174* 146*  BUN 70*  --  79* 87* 91* 92*  CREATININE 2.37* 2.54* 2.68* 2.72* 2.80* 2.76*  CALCIUM 8.7  --  8.8 9.0 9.3 9.3   Liver Function Tests:  Recent Labs Lab 07/24/14 0413  AST 19  ALT 18  ALKPHOS 127*  BILITOT 1.0  PROT 7.8  ALBUMIN 2.5*   No results found for this basename: LIPASE, AMYLASE,  in the last 168 hours No results found for this basename: AMMONIA,  in the last 168 hours CBC:  Recent Labs Lab 07/23/14 1803 07/24/14 0413 07/26/14 0400 07/27/14 0438 07/28/14 0448 07/29/14 0435  WBC 5.7 7.0 4.6 4.8 6.2 6.4  NEUTROABS 4.9  --   --   --   --   --   HGB 10.2* 9.6* 8.6* 8.5* 9.4* 9.4*  HCT 31.2* 29.8* 26.9* 26.9* 29.7* 29.1*  MCV 96.0 96.8 96.8 96.8 97.4 95.1  PLT 250 217 154 171 221 240   Cardiac Enzymes: No results  found for this basename: CKTOTAL, CKMB, CKMBINDEX, TROPONINI,  in the last 168 hours BNP (last 3 results) No results found for this basename: PROBNP,  in the last 8760 hours CBG:  Recent Labs Lab 07/28/14 0722 07/28/14 1146 07/28/14 1652 07/28/14 2100 07/29/14 0722  GLUCAP 170* 166* 124* 166* 144*    Recent Results (from the past 240 hour(s))  CULTURE, BLOOD (ROUTINE X 2)     Status: None   Collection Time    07/23/14  6:35 PM      Result Value Ref Range Status   Specimen  Description BLOOD LEFT ANTECUBITAL   Final   Special Requests BOTTLES DRAWN AEROBIC AND ANAEROBIC 5ML   Final   Culture  Setup Time     Final   Value: 07/23/2014 22:16     Performed at [facility]   Culture     Final   Value: BACTEROIDES FRAGILIS     Note: BETA LACTAMASE POSITIVE     Note: Gram Stain Report Called to,Read Back By and Verified With: Rulon Abide RN on 07/24/14 at 20:45 by Rise Mu     Performed at [facility]   Report Status 07/26/2014 FINAL   Final  CULTURE, BLOOD (ROUTINE X 2)     Status: None   Collection Time    07/23/14  6:38 PM      Result Value Ref Range Status   Specimen Description BLOOD RIGHT ANTECUBITAL   Final   Special Requests BOTTLES DRAWN AEROBIC AND ANAEROBIC 5ML   Final   Culture  Setup Time     Final   Value: 07/23/2014 22:16     Performed at [facility]   Culture     Final   Value: BACTEROIDES FRAGILIS     Note: CRITICAL RESULT CALLED TO, READ BACK BY AND VERIFIED WITH: Saxman RN @1015PM  Thelma Comp [id number] BETA LACTAMASE POSITIVE     Performed at [facility]   Report Status 07/26/2014 FINAL   Final  WOUND CULTURE     Status: None   Collection Time    07/24/14  3:44 PM      Result Value Ref Range Status   Specimen Description LEG   Final   Special Requests Normal   Final   Gram Stain     Final   Value: RARE SQUAMOUS EPITHELIAL CELLS PRESENT     RARE WBC PRESENT, PREDOMINANTLY PMN     FEW GRAM POSITIVE COCCI IN PAIRS     FEW  GRAM NEGATIVE RODS     Performed at [facility]   Culture     Final   Value: FEW STAPHYLOCOCCUS AUREUS     Note: RIFAMPIN AND GENTAMICIN SHOULD NOT BE USED AS SINGLE DRUGS FOR TREATMENT OF STAPH INFECTIONS.     Performed at [facility]   Report Status 07/29/2014 FINAL   Final   Organism ID, Bacteria STAPHYLOCOCCUS AUREUS   Final  CULTURE, BLOOD (ROUTINE X 2)     Status: None   Collection Time    07/27/14 11:15 AM      Result Value Ref Range Status   Specimen Description BLOOD LEFT ARM   Final   Special Requests BOTTLES DRAWN AEROBIC AND ANAEROBIC 10CC EACH   Final   Culture  Setup Time     Final   Value: 07/27/2014 13:54     Performed at [facility]   Culture     Final   Value:        BLOOD CULTURE RECEIVED NO GROWTH TO DATE CULTURE WILL BE HELD FOR 5 DAYS BEFORE ISSUING A FINAL NEGATIVE REPORT     Performed at [facility]   Report Status PENDING   Incomplete  CULTURE, BLOOD (ROUTINE X 2)     Status: None   Collection Time    07/27/14 11:20 AM      Result Value Ref Range Status   Specimen Description BLOOD RIGHT ARM   Final   Special Requests BOTTLES DRAWN AEROBIC AND ANAEROBIC [place]   Final  Culture  Setup Time     Final   Value: 07/27/2014 13:54     Performed at [facility]   Culture     Final   Value:        BLOOD CULTURE RECEIVED NO GROWTH TO DATE CULTURE WILL BE HELD FOR 5 DAYS BEFORE ISSUING A FINAL NEGATIVE REPORT     Performed at [facility]   Report Status PENDING   Incomplete     Studies: Ct Abdomen Pelvis Wo Contrast  07/28/2014   CLINICAL DATA:  Right side abdominal pain, bacteria in urine  EXAM: CT ABDOMEN AND PELVIS WITHOUT CONTRAST  TECHNIQUE: Multidetector CT imaging of the abdomen and pelvis was performed following the standard protocol without IV contrast.  COMPARISON:  01/22/2011  FINDINGS: Lung bases are unremarkable. Sagittal images of the spine shows disc space flattening with vacuum disc phenomenon  at L5-S1 level. Mild disc space flattening with posterior disc bulge at L4-L5 level. Unenhanced liver shows no biliary ductal dilatation. The patient is status postcholecystectomy. The pancreas, spleen and adrenal glands are unremarkable. Unenhanced kidneys shows no nephrolithiasis. The left kidney is smaller in size. Probable postsurgical changes lower pole of the left kidney. There is a cyst in inferior aspect of the left kidney measures 1.3 cm. No hydronephrosis or hydroureter. No calcified ureteral calculi.  Moderate colonic stool. Oral contrast was given to the patient. No aortic aneurysm. No small bowel obstruction. The terminal ileum is unremarkable. Moderate distended urinary bladder. There is a isodense kidney lesion upper pole of the left kidney measures 1 cm. This cannot be characterized without IV contrast. Further correlation with enhanced CT or MRI is recommended.  Right kidney is mi rotated. Probable cyst in lower pole of the right kidney measures 1.1 cm.  The uterus is unremarkable. No calcified stones are noted within urinary bladder. No inguinal adenopathy. Small bilateral inguinal lymph nodes are noted. No destructive bony lesions are noted within pelvis.  IMPRESSION: 1. No nephrolithiasis.  No hydronephrosis or hydroureter. 2. Isodense exophytic lesion in upper pole of the left kidney measures 1 cm. Further correlation with enhanced CT or MRI is recommended. Probable postsurgical changes lower pole of the left kidney. There is a cyst in inferior aspect of the left kidney measures 1.3 cm. 3. No calcified ureteral calculi. 4. No small bowel obstruction.  Moderate colonic stool. 5. Moderate distended urinary bladder. 6. Degenerative changes lumbar spine L4-L5 and L5-S1 level.   Electronically Signed   By: Lahoma Crocker M.D.   On: 07/28/2014 16:23   Ct Extrem Lower Wo Cm Bil  07/27/2014   CLINICAL DATA:  Open wounds involving both distal thighs medially. Evaluate cellulitis.  EXAM: CT OF THE LOWER  BILATERAL EXTREMITY WITHOUT CONTRAST  TECHNIQUE: Multidetector CT imaging of both thighs was performed according to the standard protocol. Images extend from the lower pelvis through the proximal lower legs. No contrast was administered.  COMPARISON:  None.  FINDINGS: There is no evidence of acute fracture or dislocation. There are no significant arthropathic changes at either hip. There are advanced tricompartmental degenerative changes at both knees with large osteophytes. There are possible intra-articular loose bodies, especially on the right.  There is diffuse muscular atrophy in both thighs. Multiple superficial varicosities are present anteriorly within the right thigh. There is diffuse subcutaneous edema throughout both thighs, especially distally. The soft tissues are incompletely visualized due to the patient's body habitus. There is an area of skin ulceration medially in the distal  left thigh, best seen on axial image number 98. There is some adjacent soft tissue emphysema. No underlying focal fluid collection or foreign body is seen.  IMPRESSION: 1. Diffuse soft tissue edema within both thighs consistent with cellulitis. There is an area of skin ulceration medially in the distal left thigh with mild adjacent soft tissue emphysema. No focal underlying fluid collection is identified to suggest an abscess. 2. Multiple varicosities are present within the right thigh. 3. No evidence of osteomyelitis. 4. 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   Electronically Signed   By: Camie Patience M.D.   On: 07/27/2014 16:30    Scheduled Meds: . amiodarone  400 mg Oral BID  . calcitRIOL  0.25 mcg Oral BID  . calcium carbonate  1 tablet Oral QID   And  . cholecalciferol  200 Units Oral QID  . febuxostat  80 mg Oral Daily  . gabapentin  100 mg Oral BID  . imipenem-cilastatin  250 mg Intravenous 4 times per day  . insulin aspart  0-15 Units Subcutaneous TID WC  . insulin glargine  28 Units  Subcutaneous QHS  . iron polysaccharides  150 mg Oral BID  . levothyroxine  125 mcg Oral QAC breakfast  . lidocaine-EPINEPHrine  20 mL Intradermal Once  . linagliptin  5 mg Oral Daily  . metoprolol tartrate  12.5 mg Oral BID  . multivitamin with minerals  1 tablet Oral Daily  . mupirocin ointment   Topical BID  . nitroGLYCERIN  0.4 mg Transdermal QODAY  . potassium chloride SA  20 mEq Oral QID  . saccharomyces boulardii  250 mg Oral BID  . sodium chloride  3 mL Intravenous Q12H  . vancomycin  1,500 mg Intravenous Once  . Vitamin D (Ergocalciferol)  50,000 Units Oral Once per day on Mon Thu  . warfarin  7.5 mg Oral ONCE-1800  . Warfarin - Pharmacist Dosing Inpatient   Does not apply q1800   Continuous Infusions:   Active Problems:   OBESITY, MORBID   HYPERTENSION   PACEMAKER, PERMANENT   Obesity hypoventilation syndrome   Hypothyroidism, acquired, autoimmune   Solitary kidney, acquired   Cardiomyopathy, nonischemic- EF 30-35% June 2014   Type 2 diabetes mellitus   CKD (chronic kidney disease) stage 4, GFR 15-29 ml/min   PAF (paroxysmal atrial fibrillation)   Hypocalcemia   Cellulitis and abscess    Time spent: 30 min    Stellar Gensel, [facility] Pager [phone]. If 7PM-7AM, please contact night-coverage at [email or web address], password [facility] 07/29/2014, 12:29 PM  LOS: 6 days

## 2014-07-29 NOTE — Evaluation (Signed)
Physical Therapy Evaluation Patient Details Name: [name] MRN: [id number] DOB: [birth year] Today's Date: 07/29/2014   History of Present Illness  64 y.o. 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   Clinical Impression  Pt currently with functional limitations due to the deficits listed below (see PT Problem List). Pt will benefit from skilled PT to increase their independence and safety with mobility to allow discharge to the venue listed below.  Pt reports decreased mobility over past week prior to admission however at baseline usually independent.  Pt agreeable to PT to continue to assist with mobility as well as HHPT however declines SNF at this time.     Follow Up Recommendations Home health PT;Supervision for mobility/OOB (refuses SNF)    Equipment Recommendations  None recommended by PT    Recommendations for Other Services       Precautions / Restrictions Precautions Precautions: Fall      Mobility  Bed Mobility Overal bed mobility: Needs Assistance Bed Mobility: Supine to Sit;Sit to Supine     Supine to sit: Mod assist Sit to supine: Mod assist   General bed mobility comments: assist for bil LEs  Transfers Overall transfer level: Needs assistance Equipment used: Rolling walker (2 wheeled) Transfers: Sit to/from Stand Sit to Stand: Min assist;+2 physical assistance;From elevated surface         General transfer comment: performed twice, upon standing rests forearms on RW, able to perform marching for 3 minutes after first stand and then required rest breaks, performed marching again for 1 min before too fatigued to continue  Ambulation/Gait                Stairs            Wheelchair Mobility    Modified Rankin (Stroke Patients Only)        Balance                                             Pertinent Vitals/Pain Reports sore wounds however not painful, repositioned to comfort    Home Living Family/patient expects to be discharged to:: Private residence Living Arrangements: Spouse/significant other   Type of Home: House Home Access: Stairs to enter Entrance Stairs-Rails: Right Entrance Stairs-Number of Steps: 2-3 Home Layout: One level Home Equipment: Environmental consultant - 2 wheels      Prior Function Level of Independence: Independent               Hand Dominance        Extremity/Trunk Assessment   Upper Extremity Assessment: Generalized weakness           Lower Extremity Assessment: Generalized weakness         Communication   Communication: No difficulties  Cognition Arousal/Alertness: Awake/alert Behavior During Therapy: WFL for tasks assessed/performed Overall Cognitive Status: Within Functional Limits for tasks assessed                      General Comments      Exercises        Assessment/Plan    PT Assessment Patient needs continued PT services  PT Diagnosis Difficulty walking;Generalized weakness   PT Problem List Decreased  strength;Decreased activity tolerance;Decreased mobility;Decreased balance;Obesity  PT Treatment Interventions DME instruction;Gait training;Functional mobility training;Therapeutic activities;Therapeutic exercise;Patient/family education   PT Goals (Current goals can be found in the Care Plan section) Acute Rehab PT Goals PT Goal Formulation: With patient Time For Goal Achievement: 08/05/14 Potential to Achieve Goals: Good    Frequency Min 3X/week   Barriers to discharge        Co-evaluation               End of Session Equipment Utilized During Treatment: Gait belt Activity Tolerance: Patient limited by fatigue Patient left: in bed;with call bell/phone within reach           Time: 1159-1220 PT Time  Calculation (min): 21 min   Charges:   PT Evaluation $Initial PT Evaluation Tier I: 1 Procedure PT Treatments $Therapeutic Activity: 8-22 mins   PT G Codes:          Aneri Slagel,KATHrine E 07/29/2014, 1:23 PM Carmelia Bake, PT, DPT 07/29/2014 Pager: [phone]

## 2014-07-29 NOTE — Progress Notes (Signed)
ANTICOAGULATION CONSULT NOTE - Follow Up  Pharmacy Consult for warfarin Indication: afib   Allergies  Allergen Reactions  . Ivp Dye [Iodinated Diagnostic Agents] Shortness Of Breath    Turn red, can't breathe  . Betadine [Povidone Iodine]   . Diphenhydramine Hcl Swelling    In hands and eyes  . Fish Allergy Nausea And Vomiting  . Iohexol      Desc: PT TURNS RED AND WHEEZING   . Penicillins Other (See Comments)    Resp arrest as child  . Povidone-Iodine Other (See Comments)    Wheezing, turn red, can't breath, and blisters  . Promethazine Hcl Other (See Comments)    Low Blood Pressure  . Tape     Blisters, can use paper tape for short periods  . Clindamycin Hives and Rash    wheezing  . Iodine Rash  . Morphine Sulfate Nausea And Vomiting and Rash    Patient Measurements: Height: 5\' 6"  (167.6 cm) Weight: 331 lb 8 oz (150.367 kg) IBW/kg (Calculated) : 59.3 Vital Signs: Temp: 98.1 F (36.7 C) (07/30 0600) Temp src: Oral (07/30 0600) BP: 94/50 mmHg (07/30 0600) Pulse Rate: 65 (07/30 0600)  Labs:  Recent Labs  07/27/14 0438 07/28/14 0448 07/29/14 0435  HGB 8.5* 9.4* 9.4*  HCT 26.9* 29.7* 29.1*  PLT 171 221 240  LABPROT 22.3* 20.5* 19.3*  INR 1.96* 1.76* 1.63*  CREATININE 2.72* 2.80* 2.76*    Estimated Creatinine Clearance: 30.7 ml/min (by C-G formula based on Cr of 2.76).   Assessment: 38 YOF on chronic warfarin/amiodarone therapy for afib. INR supratherapeutic at admission.   Home dose 5mg  daily with 2.5mg  MWF  Today's INR SUBtherapeutic and dropping - increased dose of 7.5mg  given yesterday PM but also note that 5mg  was supposed to have been given on 7/28 but was never ordered and therefore patient did not receive a dose of warfarin on 7/28 which could be contributing to dropping INR now  CBC: stable. No bleeding reported per chart notes.  BLE dopplers neg for DVT  Diet: heart healthy, eating 50-100% of meals  Drug interactions:  Started on  levofloxacin 7/24. Flagyl 7/2. Both discontinued on 7/28 - can increase warfarin's effects  Chronic amiodarone - dosage increased in May outpatient, possible contribution to elevated INR as effects are typically seen weeks/months into therapy  Goal of Therapy:  INR 2-3   Plan:   Repeat Warfarin 7.5mg  x1 today at [facility]  Daily INR  Adrian Saran, PharmD, BCPS Pager [phone] 07/29/2014 8:25 AM

## 2014-07-29 NOTE — Progress Notes (Signed)
Patient ID: [name], female   DOB: [birth year], 64 y.o.   MRN: [id number]  Subjective: No significant complaints.  Sitting up in chair.  Reports less swelling and erythema to legs.    Objective:  Vital signs:  Filed Vitals:   07/28/14 1500 07/28/14 1955 07/29/14 0441 07/29/14 0600  BP: 88/49 90/50  94/50  Pulse: 72 74  65  Temp: 97.5 F (36.4 C) 97.9 F (36.6 C)  98.1 F (36.7 C)  TempSrc: Oral Oral  Oral  Resp: $Remo'18 18  18  'TiVnU$ Height:      Weight:   331 lb 8 oz (150.367 kg)   SpO2: 100% 100%  96%    Last BM Date: 07/28/14  Intake/Output   Yesterday:  07/29 0701 - 07/30 0700 In: 780 [P.O.:480; IV Piggyback:300] Out: 1900 [Urine:1900] This shift:  Total I/O In: 230 [P.O.:230] Out: -   Physical Exam: General: Pt awake/alert/oriented x4 in no acute distress Skin: BLE erythema, swelling, wounds-dressings are dry and intact.   Problem List:   Active Problems:   OBESITY, MORBID   HYPERTENSION   PACEMAKER, PERMANENT   Obesity hypoventilation syndrome   Hypothyroidism, acquired, autoimmune   Solitary kidney, acquired   Cardiomyopathy, nonischemic- EF 30-35% June 2014   Type 2 diabetes mellitus   CKD (chronic kidney disease) stage 4, GFR 15-29 ml/min   PAF (paroxysmal atrial fibrillation)   Hypocalcemia   Cellulitis and abscess    Results:   Labs: Results for orders placed during the hospital encounter of 07/23/14 (from the past 48 hour(s))  GLUCOSE, CAPILLARY     Status: Abnormal   Collection Time    07/27/14 11:12 AM      Result Value Ref Range   Glucose-Capillary 147 (*) 70 - 99 mg/dL  CULTURE, BLOOD (ROUTINE X 2)     Status: None   Collection Time    07/27/14 11:15 AM      Result Value Ref Range   Specimen Description BLOOD LEFT ARM     Special Requests BOTTLES DRAWN AEROBIC AND ANAEROBIC 10CC EACH     Culture  Setup Time       Value: 07/27/2014 13:54     Performed at [facility]   Culture       Value:        BLOOD CULTURE RECEIVED  NO GROWTH TO DATE CULTURE WILL BE HELD FOR 5 DAYS BEFORE ISSUING A FINAL NEGATIVE REPORT     Performed at [facility]   Report Status PENDING    CULTURE, BLOOD (ROUTINE X 2)     Status: None   Collection Time    07/27/14 11:20 AM      Result Value Ref Range   Specimen Description BLOOD RIGHT ARM     Special Requests BOTTLES DRAWN AEROBIC AND ANAEROBIC 10CC EACH     Culture  Setup Time       Value: 07/27/2014 13:54     Performed at [facility]   Culture       Value:        BLOOD CULTURE RECEIVED NO GROWTH TO DATE CULTURE WILL BE HELD FOR 5 DAYS BEFORE ISSUING A FINAL NEGATIVE REPORT     Performed at [facility]   Report Status PENDING    GLUCOSE, CAPILLARY     Status: Abnormal   Collection Time    07/27/14  4:56 PM      Result Value Ref Range   Glucose-Capillary 158 (*)  70 - 99 mg/dL  PROTIME-INR     Status: Abnormal   Collection Time    07/28/14  4:48 AM      Result Value Ref Range   Prothrombin Time 20.5 (*) 11.6 - 15.2 seconds   INR 1.76 (*) 0.00 - 1.49  CBC     Status: Abnormal   Collection Time    07/28/14  4:48 AM      Result Value Ref Range   WBC 6.2  4.0 - 10.5 K/uL   RBC 3.05 (*) 3.87 - 5.11 MIL/uL   Hemoglobin 9.4 (*) 12.0 - 15.0 g/dL   HCT 29.7 (*) 36.0 - 46.0 %   MCV 97.4  78.0 - 100.0 fL   MCH 30.8  26.0 - 34.0 pg   MCHC 31.6  30.0 - 36.0 g/dL   RDW 18.4 (*) 11.5 - 15.5 %   Platelets 221  150 - 400 K/uL  BASIC METABOLIC PANEL     Status: Abnormal   Collection Time    07/28/14  4:48 AM      Result Value Ref Range   Sodium 134 (*) 137 - 147 mEq/L   Potassium 4.2  3.7 - 5.3 mEq/L   Chloride 92 (*) 96 - 112 mEq/L   CO2 29  19 - 32 mEq/L   Glucose, Bld 174 (*) 70 - 99 mg/dL   BUN 91 (*) 6 - 23 mg/dL   Creatinine, Ser 2.80 (*) 0.50 - 1.10 mg/dL   Calcium 9.3  8.4 - 10.5 mg/dL   GFR calc non Af Amer 17 (*) >90 mL/min   GFR calc Af Amer 19 (*) >90 mL/min   Comment: (NOTE)     The eGFR has been calculated using the CKD EPI equation.      This calculation has not been validated in all clinical situations.     eGFR's persistently <90 mL/min signify possible Chronic Kidney     Disease.   Anion gap 13  5 - 15  GLUCOSE, CAPILLARY     Status: Abnormal   Collection Time    07/28/14  7:22 AM      Result Value Ref Range   Glucose-Capillary 170 (*) 70 - 99 mg/dL  VANCOMYCIN, RANDOM     Status: None   Collection Time    07/28/14 11:06 AM      Result Value Ref Range   Vancomycin Rm 23.2     Comment:            Random Vancomycin therapeutic     range is dependent on dosage and     time of specimen collection.     A peak range is 20.0-40.0 ug/mL     A trough range is 5.0-15.0 ug/mL             GLUCOSE, CAPILLARY     Status: Abnormal   Collection Time    07/28/14 11:46 AM      Result Value Ref Range   Glucose-Capillary 166 (*) 70 - 99 mg/dL  GLUCOSE, CAPILLARY     Status: Abnormal   Collection Time    07/28/14  4:52 PM      Result Value Ref Range   Glucose-Capillary 124 (*) 70 - 99 mg/dL  GLUCOSE, CAPILLARY     Status: Abnormal   Collection Time    07/28/14  9:00 PM      Result Value Ref Range   Glucose-Capillary 166 (*) 70 - 99 mg/dL  PROTIME-INR  Status: Abnormal   Collection Time    07/29/14  4:35 AM      Result Value Ref Range   Prothrombin Time 19.3 (*) 11.6 - 15.2 seconds   INR 1.63 (*) 0.00 - 2.63  BASIC METABOLIC PANEL     Status: Abnormal   Collection Time    07/29/14  4:35 AM      Result Value Ref Range   Sodium 134 (*) 137 - 147 mEq/L   Potassium 4.1  3.7 - 5.3 mEq/L   Chloride 94 (*) 96 - 112 mEq/L   CO2 27  19 - 32 mEq/L   Glucose, Bld 146 (*) 70 - 99 mg/dL   BUN 92 (*) 6 - 23 mg/dL   Creatinine, Ser 2.76 (*) 0.50 - 1.10 mg/dL   Calcium 9.3  8.4 - 10.5 mg/dL   GFR calc non Af Amer 17 (*) >90 mL/min   GFR calc Af Amer 20 (*) >90 mL/min   Comment: (NOTE)     The eGFR has been calculated using the CKD EPI equation.     This calculation has not been validated in all clinical situations.      eGFR's persistently <90 mL/min signify possible Chronic Kidney     Disease.   Anion gap 13  5 - 15  CBC     Status: Abnormal   Collection Time    07/29/14  4:35 AM      Result Value Ref Range   WBC 6.4  4.0 - 10.5 K/uL   RBC 3.06 (*) 3.87 - 5.11 MIL/uL   Hemoglobin 9.4 (*) 12.0 - 15.0 g/dL   HCT 29.1 (*) 36.0 - 46.0 %   MCV 95.1  78.0 - 100.0 fL   MCH 30.7  26.0 - 34.0 pg   MCHC 32.3  30.0 - 36.0 g/dL   RDW 18.5 (*) 11.5 - 15.5 %   Platelets 240  150 - 400 K/uL  VANCOMYCIN, TROUGH     Status: None   Collection Time    07/29/14  4:35 AM      Result Value Ref Range   Vancomycin Tr 19.5  10.0 - 20.0 ug/mL  GLUCOSE, CAPILLARY     Status: Abnormal   Collection Time    07/29/14  7:22 AM      Result Value Ref Range   Glucose-Capillary 144 (*) 70 - 99 mg/dL    Imaging / Studies: Ct Abdomen Pelvis Wo Contrast  07/28/2014   CLINICAL DATA:  Right side abdominal pain, bacteria in urine  EXAM: CT ABDOMEN AND PELVIS WITHOUT CONTRAST  TECHNIQUE: Multidetector CT imaging of the abdomen and pelvis was performed following the standard protocol without IV contrast.  COMPARISON:  01/22/2011  FINDINGS: Lung bases are unremarkable. Sagittal images of the spine shows disc space flattening with vacuum disc phenomenon at L5-S1 level. Mild disc space flattening with posterior disc bulge at L4-L5 level. Unenhanced liver shows no biliary ductal dilatation. The patient is status postcholecystectomy. The pancreas, spleen and adrenal glands are unremarkable. Unenhanced kidneys shows no nephrolithiasis. The left kidney is smaller in size. Probable postsurgical changes lower pole of the left kidney. There is a cyst in inferior aspect of the left kidney measures 1.3 cm. No hydronephrosis or hydroureter. No calcified ureteral calculi.  Moderate colonic stool. Oral contrast was given to the patient. No aortic aneurysm. No small bowel obstruction. The terminal ileum is unremarkable. Moderate distended urinary bladder.  There is a isodense kidney lesion upper pole of the  left kidney measures 1 cm. This cannot be characterized without IV contrast. Further correlation with enhanced CT or MRI is recommended.  Right kidney is mi rotated. Probable cyst in lower pole of the right kidney measures 1.1 cm.  The uterus is unremarkable. No calcified stones are noted within urinary bladder. No inguinal adenopathy. Small bilateral inguinal lymph nodes are noted. No destructive bony lesions are noted within pelvis.  IMPRESSION: 1. No nephrolithiasis.  No hydronephrosis or hydroureter. 2. Isodense exophytic lesion in upper pole of the left kidney measures 1 cm. Further correlation with enhanced CT or MRI is recommended. Probable postsurgical changes lower pole of the left kidney. There is a cyst in inferior aspect of the left kidney measures 1.3 cm. 3. No calcified ureteral calculi. 4. No small bowel obstruction.  Moderate colonic stool. 5. Moderate distended urinary bladder. 6. Degenerative changes lumbar spine L4-L5 and L5-S1 level.   Electronically Signed   By: Lahoma Crocker M.D.   On: 07/28/2014 16:23   Ct Extrem Lower Wo Cm Bil  07/27/2014   CLINICAL DATA:  Open wounds involving both distal thighs medially. Evaluate cellulitis.  EXAM: CT OF THE LOWER BILATERAL EXTREMITY WITHOUT CONTRAST  TECHNIQUE: Multidetector CT imaging of both thighs was performed according to the standard protocol. Images extend from the lower pelvis through the proximal lower legs. No contrast was administered.  COMPARISON:  None.  FINDINGS: There is no evidence of acute fracture or dislocation. There are no significant arthropathic changes at either hip. There are advanced tricompartmental degenerative changes at both knees with large osteophytes. There are possible intra-articular loose bodies, especially on the right.  There is diffuse muscular atrophy in both thighs. Multiple superficial varicosities are present anteriorly within the right thigh. There is diffuse  subcutaneous edema throughout both thighs, especially distally. The soft tissues are incompletely visualized due to the patient's body habitus. There is an area of skin ulceration medially in the distal left thigh, best seen on axial image number 98. There is some adjacent soft tissue emphysema. No underlying focal fluid collection or foreign body is seen.  IMPRESSION: 1. Diffuse soft tissue edema within both thighs consistent with cellulitis. There is an area of skin ulceration medially in the distal left thigh with mild adjacent soft tissue emphysema. No focal underlying fluid collection is identified to suggest an abscess. 2. Multiple varicosities are present within the right thigh. 3. No evidence of osteomyelitis. 4. 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   Electronically Signed   By: Camie Patience M.D.   On: 07/27/2014 16:30    Scheduled Meds: . amiodarone  400 mg Oral BID  . calcitRIOL  0.25 mcg Oral BID  . calcium carbonate  1 tablet Oral QID   And  . cholecalciferol  200 Units Oral QID  . febuxostat  80 mg Oral Daily  . gabapentin  100 mg Oral BID  . imipenem-cilastatin  250 mg Intravenous 4 times per day  . insulin aspart  0-15 Units Subcutaneous TID WC  . insulin glargine  28 Units Subcutaneous QHS  . iron polysaccharides  150 mg Oral BID  . levothyroxine  125 mcg Oral QAC breakfast  . lidocaine-EPINEPHrine  20 mL Intradermal Once  . linagliptin  5 mg Oral Daily  . metoprolol tartrate  12.5 mg Oral BID  . multivitamin with minerals  1 tablet Oral Daily  . mupirocin ointment   Topical BID  . nitroGLYCERIN  0.4 mg Transdermal QODAY  . potassium chloride SA  20 mEq Oral QID  . sodium chloride  3 mL Intravenous Q12H  . vancomycin  1,500 mg Intravenous Once  . Vitamin D (Ergocalciferol)  50,000 Units Oral Once per day on Mon Thu  . warfarin  7.5 mg Oral ONCE-1800  . Warfarin - Pharmacist Dosing Inpatient   Does not apply q1800   Continuous Infusions:  PRN  Meds:.sodium chloride, acetaminophen, acetaminophen, albuterol, ALPRAZolam, guaiFENesin-dextromethorphan, nitroGLYCERIN, ondansetron (ZOFRAN) IV, ondansetron, oxyCODONE-acetaminophen, sodium chloride   Antibiotics: Anti-infectives   Start     Dose/Rate Route Frequency Ordered Stop   07/29/14 1000  vancomycin (VANCOCIN) 1,500 mg in sodium chloride 0.9 % 500 mL IVPB     1,500 mg 250 mL/hr over 120 Minutes Intravenous  Once 07/29/14 0541     07/27/14 1200  imipenem-cilastatin (PRIMAXIN) 250 mg in sodium chloride 0.9 % 100 mL IVPB     250 mg 200 mL/hr over 30 Minutes Intravenous 4 times per day 07/27/14 1103     07/27/14 0200  metroNIDAZOLE (FLAGYL) IVPB 500 mg  Status:  Discontinued     500 mg 100 mL/hr over 60 Minutes Intravenous Every 6 hours 07/26/14 1859 07/27/14 1247   07/26/14 1900  metroNIDAZOLE (FLAGYL) IVPB 500 mg     500 mg 100 mL/hr over 60 Minutes Intravenous STAT 07/26/14 1858 07/26/14 2013   07/26/14 1000  vancomycin (VANCOCIN) 2,000 mg in sodium chloride 0.9 % 500 mL IVPB  Status:  Discontinued     2,000 mg 250 mL/hr over 120 Minutes Intravenous Every 48 hours 07/26/14 0445 07/27/14 1040   07/24/14 1000  vancomycin (VANCOCIN) 2,000 mg in sodium chloride 0.9 % 500 mL IVPB  Status:  Discontinued     2,000 mg 250 mL/hr over 120 Minutes Intravenous Every 24 hours 07/23/14 1914 07/25/14 0914   07/23/14 2200  levofloxacin (LEVAQUIN) IVPB 750 mg  Status:  Discontinued     750 mg 100 mL/hr over 90 Minutes Intravenous Every 48 hours 07/23/14 2059 07/27/14 1040   07/23/14 1930  vancomycin (VANCOCIN) 2,000 mg in sodium chloride 0.9 % 500 mL IVPB     2,000 mg 250 mL/hr over 120 Minutes Intravenous NOW 07/23/14 1842 07/23/14 2111      Assessment/Plan CHF  PAF on coumadin(INR 1.7)  CKD  Diabetes mellitus  Obesity  LE lymphedema   Bacteroides fragilis bacteremia  LLE wounds/cellulitis  -Hydrotherapy to right and left medial leg wounds.  Will proceed with bedside debridement  this afternoon -add santyl -WOC consult for wound care  -BID wet to dry dressing change to left medial leg -antibiotics per primary service/ID  -will follow along    Erby Pian, [facility] Surgery Pager [phone] Office [phone]  07/29/2014 11:17 AM

## 2014-07-29 NOTE — Progress Notes (Signed)
ANTIBIOTIC CONSULT NOTE - FOLLOW UP  Pharmacy Consult for Vancomycin and Primaxin Indication: Cellulitis/Bactermia  Allergies  Allergen Reactions  . Ivp Dye [Iodinated Diagnostic Agents] Shortness Of Breath    Turn red, can't breathe  . Betadine [Povidone Iodine]   . Diphenhydramine Hcl Swelling    In hands and eyes  . Fish Allergy Nausea And Vomiting  . Iohexol      Desc: PT TURNS RED AND WHEEZING   . Penicillins Other (See Comments)    Resp arrest as child  . Povidone-Iodine Other (See Comments)    Wheezing, turn red, can't breath, and blisters  . Promethazine Hcl Other (See Comments)    Low Blood Pressure  . Tape     Blisters, can use paper tape for short periods  . Clindamycin Hives and Rash    wheezing  . Iodine Rash  . Morphine Sulfate Nausea And Vomiting and Rash    Patient Measurements: Height: 5\' 6"  (167.6 cm) Weight: 331 lb 8 oz (150.367 kg) IBW/kg (Calculated) : 59.3  Vital Signs: Temp: 97.9 F (36.6 C) (07/29 1955) Temp src: Oral (07/29 1955) BP: 90/50 mmHg (07/29 1955) Pulse Rate: 74 (07/29 1955) Intake/Output from previous day: 07/29 0701 - 07/30 0700 In: 780 [P.O.:480; IV Piggyback:300] Out: 1900 [Urine:1900] Intake/Output from this shift: Total I/O In: 340 [P.O.:240; IV Piggyback:100] Out: 900 [Urine:900]  Labs:  Recent Labs  07/27/14 0438 07/28/14 0448 07/29/14 0435  WBC 4.8 6.2 6.4  HGB 8.5* 9.4* 9.4*  PLT 171 221 240  CREATININE 2.72* 2.80* 2.76*   Estimated Creatinine Clearance: 30.7 ml/min (by C-G formula based on Cr of 2.76).  22 ml/min/1.26m2 (normalized)   Recent Labs  07/28/14 1106 07/29/14 0435  VANCOTROUGH  --  19.5  VANCORANDOM 23.2  --      Microbiology: Recent Results (from the past 720 hour(s))  CULTURE, BLOOD (ROUTINE X 2)     Status: None   Collection Time    07/23/14  6:35 PM      Result Value Ref Range Status   Specimen Description BLOOD LEFT ANTECUBITAL   Final   Special Requests BOTTLES DRAWN AEROBIC  AND ANAEROBIC 5ML   Final   Culture  Setup Time     Final   Value: 07/23/2014 22:16     Performed at [facility]   Culture     Final   Value: BACTEROIDES FRAGILIS     Note: BETA LACTAMASE POSITIVE     Note: Gram Stain Report Called to,Read Back By and Verified With: Rulon Abide RN on 07/24/14 at 20:45 by Rise Mu     Performed at [facility]   Report Status 07/26/2014 FINAL   Final  CULTURE, BLOOD (ROUTINE X 2)     Status: None   Collection Time    07/23/14  6:38 PM      Result Value Ref Range Status   Specimen Description BLOOD RIGHT ANTECUBITAL   Final   Special Requests BOTTLES DRAWN AEROBIC AND ANAEROBIC 5ML   Final   Culture  Setup Time     Final   Value: 07/23/2014 22:16     Performed at [facility]   Culture     Final   Value: BACTEROIDES FRAGILIS     Note: CRITICAL RESULT CALLED TO, READ BACK BY AND VERIFIED WITH: Wentworth RN @1015PM  VINCJ [id number] BETA LACTAMASE POSITIVE     Performed at [facility]   Report Status 07/26/2014 FINAL   Final  WOUND CULTURE     Status: None   Collection Time    07/24/14  3:44 PM      Result Value Ref Range Status   Specimen Description LEG   Final   Special Requests Normal   Final   Gram Stain     Final   Value: RARE SQUAMOUS EPITHELIAL CELLS PRESENT     RARE WBC PRESENT, PREDOMINANTLY PMN     FEW GRAM POSITIVE COCCI IN PAIRS     FEW GRAM NEGATIVE RODS     Performed at [facility]   Culture     Final   Value: FEW STAPHYLOCOCCUS AUREUS     Note: RIFAMPIN AND GENTAMICIN SHOULD NOT BE USED AS SINGLE DRUGS FOR TREATMENT OF STAPH INFECTIONS.     Performed at [facility]   Report Status PENDING   Incomplete  CULTURE, BLOOD (ROUTINE X 2)     Status: None   Collection Time    07/27/14 11:15 AM      Result Value Ref Range Status   Specimen Description BLOOD LEFT ARM   Final   Special Requests BOTTLES DRAWN AEROBIC AND ANAEROBIC 10CC EACH   Final   Culture  Setup Time     Final    Value: 07/27/2014 13:54     Performed at [facility]   Culture     Final   Value:        BLOOD CULTURE RECEIVED NO GROWTH TO DATE CULTURE WILL BE HELD FOR 5 DAYS BEFORE ISSUING A FINAL NEGATIVE REPORT     Performed at [facility]   Report Status PENDING   Incomplete  CULTURE, BLOOD (ROUTINE X 2)     Status: None   Collection Time    07/27/14 11:20 AM      Result Value Ref Range Status   Specimen Description BLOOD RIGHT ARM   Final   Special Requests BOTTLES DRAWN AEROBIC AND ANAEROBIC 10CC EACH   Final   Culture  Setup Time     Final   Value: 07/27/2014 13:54     Performed at [facility]   Culture     Final   Value:        BLOOD CULTURE RECEIVED NO GROWTH TO DATE CULTURE WILL BE HELD FOR 5 DAYS BEFORE ISSUING A FINAL NEGATIVE REPORT     Performed at [facility]   Report Status PENDING   Incomplete   Assessment: 64 y.o. 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 Patient was placed on empiric vanco and levaquin upon admission. 7/24 blood cultures positive for Bacteroides fragilis, pharmacy consulted to dose Primaxin. 7/29 wound cultures positive for staph aureus, pharmacy consulted to restart vanc.  7/24 >> vancomycin >> 7/28 7/24 >> levofloxacin >> 7/28 7/27 >> flagyl (MD)>>7/28 7/28 >> primaxin >>  Tmax: afeb WBCs: WNL CKD4 (solitary kidney): SCr 2.76 (stabilizing), CrCl 22N  7/24 blood x2: bacteroides fragilis (beta-lactamase positive) 7/24 urine: ordered (UA neg) 7/25 leg wound (after abx started): staph aureus 7/28 blood x 2: ngtd  Goal of Therapy:  Vancomycin trough level 15-20 mcg/ml Appropriate antibiotic dosing for renal function; eradication of infection  Plan:    Continue Primaxin 250mg  IV Q6H  Vancomycin 1500mg  IV  x 1 at 10:00 today  Recheck vancomycin random level in ~48hrs if renal function remains unchanged  Follow renal function and cultures  Peggyann Juba, PharmD, BCPS Pager: [phone]  07/29/2014 5:34 AM

## 2014-07-29 NOTE — Procedures (Signed)
Incision and Debridement Procedure Note  Pre-operative Diagnosis: necrotic left lower extremity wound  Post-operative Diagnosis: same  Indications: necrotic wound  Anesthesia: lidocaine with epinephrine(12cc)  Procedure Details  The procedure, risks and complications have been discussed in detail (including, but not limited to airway compromise, infection, bleeding) with the patient, and the patient has signed consent to the procedure.  The skin was sterilely prepped and draped over the affected area in the usual fashion.  I used sharp dissection with a #11 scalpel to remove necrotic tissue until I reached more healthy, vascularized tissue at depth of 3x3x2cm.to the subcutaneous.  Using my finger I was able to open an area 9cm at 4 o'clock.  There was no purulent drainage.  Hemostasis was ensured with manual pressure.  She will need to be monitored for bleeding as she is on coumadin.  [name] today was 1.6.    I packed with clean gauze & dry dressing.  The patient tolerated the procedure well.  The patient was observed until stable.    Findings: same  EBL: <5cc cc's  Drains: none  Condition: Tolerated procedure well   Complications: none.  Will start santyl.  Continue with hydrotherapy.  It is imperative the wound is packed correctly(the tunneling that is 9cm at 4 oclock) twice daily.    [name], ANP-BC

## 2014-07-29 NOTE — Procedures (Signed)
Agree with above 

## 2014-07-30 LAB — BASIC METABOLIC PANEL
ANION GAP: 14 (ref 5–15)
BUN: 92 mg/dL — ABNORMAL HIGH (ref 6–23)
CHLORIDE: 93 meq/L — AB (ref 96–112)
CO2: 26 mEq/L (ref 19–32)
Calcium: 9 mg/dL (ref 8.4–10.5)
Creatinine, Ser: 2.97 mg/dL — ABNORMAL HIGH (ref 0.50–1.10)
GFR calc Af Amer: 18 mL/min — ABNORMAL LOW (ref >90)
GFR calc non Af Amer: 16 mL/min — ABNORMAL LOW (ref >90)
Glucose, Bld: 138 mg/dL — ABNORMAL HIGH (ref 70–99)
POTASSIUM: 4.7 meq/L (ref 3.7–5.3)
Sodium: 133 mEq/L — ABNORMAL LOW (ref 137–147)

## 2014-07-30 LAB — GLUCOSE, CAPILLARY
GLUCOSE-CAPILLARY: 170 mg/dL — AB (ref 70–99)
GLUCOSE-CAPILLARY: 151 mg/dL — AB (ref 70–99)
GLUCOSE-CAPILLARY: 187 mg/dL — AB (ref 70–99)
GLUCOSE-CAPILLARY: 196 mg/dL — AB (ref 70–99)
Glucose-Capillary: 145 mg/dL — ABNORMAL HIGH (ref 70–99)

## 2014-07-30 LAB — CBC
HCT: 28.9 % — ABNORMAL LOW (ref 36.0–46.0)
HEMOGLOBIN: 9.3 g/dL — AB (ref 12.0–15.0)
MCH: 30.9 pg (ref 26.0–34.0)
MCHC: 32.2 g/dL (ref 30.0–36.0)
MCV: 96 fL (ref 78.0–100.0)
Platelets: 260 10*3/uL (ref 150–400)
RBC: 3.01 MIL/uL — AB (ref 3.87–5.11)
RDW: 18.7 % — ABNORMAL HIGH (ref 11.5–15.5)
WBC: 6.6 10*3/uL (ref 4.0–10.5)

## 2014-07-30 LAB — PROTIME-INR
INR: 1.63 — AB (ref 0.00–1.49)
Prothrombin Time: 19.3 seconds — ABNORMAL HIGH (ref 11.6–15.2)

## 2014-07-30 MED ORDER — WARFARIN SODIUM 5 MG PO TABS
5.0000 mg | ORAL_TABLET | Freq: Once | ORAL | Status: AC
  Administered 2014-07-30: 5 mg via ORAL
  Filled 2014-07-30: qty 1

## 2014-07-30 MED ORDER — WARFARIN SODIUM 7.5 MG PO TABS
7.5000 mg | ORAL_TABLET | Freq: Once | ORAL | Status: DC
  Filled 2014-07-30: qty 1

## 2014-07-30 NOTE — Progress Notes (Signed)
Hydrotherapy Treatment Note   07/30/14 1200  Subjective Assessment  Subjective Pt had surgical debridement at bedside yesterday and plans per chart to assess wound later today and possible OR debridement tomorrow.  Patient and Family Stated Goals healing LE wounds  Evaluation and Treatment  Evaluation and Treatment Procedures Explained to Patient/Family Yes  Evaluation and Treatment Procedures agreed to  Wound / Incision (Open or Dehisced) 07/29/14 Other (Comment) Thigh Left;Medial HYDRO  Date First Assessed/Time First Assessed: 07/29/14 1125   Wound Type: (c) Other (Comment)  Location: Thigh  Location Orientation: Left;Medial  Wound Description (Comments): HYDRO  Present on Admission: Yes  Dressing Type Transparent dressing;Moist to dry (tegaderm due to pt unable to tolerate any tape on skin)  Dressing Status Intact;Clean  Dressing Change Frequency Twice a day (per orders)  Site / Wound Assessment Bleeding;Red;Yellow  % Wound base Red or Granulating 20%  % Wound base Yellow 80%  % Wound base Black 0%  Peri-wound Assessment Erythema (non-blanchable);Bleeding  Tunneling (cm) 9 (tunneling at 4 o'clock 9 cm per surgical PA)  Margins Epibole (rolled edges)  Drainage Amount Moderate  Drainage Description Serosanguineous  Non-staged Wound Description Full thickness  Treatment Debridement (Selective);Hydrotherapy (Pulse lavage);Packing (Saline gauze)  Hydrotherapy  Pulsed Lavage with Suction (psi) 8 psi  Pulsed Lavage with Suction - Normal Saline Used 1000 mL  Pulsed Lavage Tip Tip with splash shield  Pulsed lavage therapy - wound location L medial thigh  Selective Debridement  Selective Debridement - Location L medial thigh wound  Selective Debridement - Tools Used Scissors;Forceps  Selective Debridement - Tissue Removed yellow slough  Wound Therapy - Assess/Plan/Recommendations  Wound Therapy - Clinical Statement Pt would benefit from hydrotherapy to assist with removing necrotic  tissue and promote wound healing environment.  Factors Delaying/Impairing Wound Healing Diabetes Mellitus;Immobility;Multiple medical problems  Hydrotherapy Plan Debridement;Dressing change;Patient/family education;Pulsatile lavage with suction  Wound Therapy - Frequency 6X / week  Wound Therapy - Follow Up Recommendations [place]  Wound Plan To perform hydrotherapy to L medial open thigh wound to remove necrotic tissue and facilitate wound healing.  Wound Therapy Goals - Improve the function of patient's integumentary system by progressing the wound(s) through the phases of wound healing by:  Decrease Necrotic Tissue to 50%  Decrease Necrotic Tissue - Progress Progressing toward goal  Increase Granulation Tissue to 50%  Increase Granulation Tissue - Progress Progressing toward goal  Improve Drainage Characteristics Min  Improve Drainage Characteristics - Progress Progressing toward goal  Goals/treatment plan/discharge plan were made with and agreed upon by patient/family Yes  Time For Goal Achievement 2 weeks  Wound Therapy - Potential for Goals Good  time: [phone]  Carmelia Bake, PT, DPT 07/30/2014 Pager: [phone]

## 2014-07-30 NOTE — Progress Notes (Signed)
Patient ID: [name], female   DOB: November 17, 1950, 64 y.o.   MRN: [id number]  Subjective: Afebrile.  No complaints.    Objective:  Vital signs:  Filed Vitals:   07/29/14 0600 07/29/14 1537 07/29/14 2125 07/30/14 0611  BP: $Re'94/50 95/42 84/50 'ppn$ 91/52  Pulse: 65 72 65 69  Temp: 98.1 F (36.7 C) 97.4 F (36.3 C) 98.2 F (36.8 C) 97.5 F (36.4 C)  TempSrc: Oral Axillary Oral Oral  Resp: $Remo'18 18 18 18  'RBOhq$ Height:      Weight:    331 lb 3.2 oz (150.231 kg)  SpO2: 96% 100% 100% 100%    Last BM Date: 07/29/14  Intake/Output   Yesterday:  07/30 0701 - 07/31 0700 In: 1480 [P.O.:950; I.V.:30; IV Piggyback:500] Out: 2100 [Urine:2100] This shift:     Physical Exam:  General: Pt awake/alert/oriented x4 in no acute distress  Skin: BLE erythema, swelling, wounds-dressings are dry and intact.    Problem List:   Active Problems:   OBESITY, MORBID   HYPERTENSION   PACEMAKER, PERMANENT   Obesity hypoventilation syndrome   Hypothyroidism, acquired, autoimmune   Solitary kidney, acquired   Cardiomyopathy, nonischemic- EF 30-35% June 2014   Type 2 diabetes mellitus   CKD (chronic kidney disease) stage 4, GFR 15-29 ml/min   PAF (paroxysmal atrial fibrillation)   Hypocalcemia   Cellulitis and abscess    Results:   Labs: Results for orders placed during the hospital encounter of 07/23/14 (from the past 48 hour(s))  VANCOMYCIN, RANDOM     Status: None   Collection Time    07/28/14 11:06 AM      Result Value Ref Range   Vancomycin Rm 23.2     Comment:            Random Vancomycin therapeutic     range is dependent on dosage and     time of specimen collection.     A peak range is 20.0-40.0 ug/mL     A trough range is 5.0-15.0 ug/mL             GLUCOSE, CAPILLARY     Status: Abnormal   Collection Time    07/28/14 11:46 AM      Result Value Ref Range   Glucose-Capillary 166 (*) 70 - 99 mg/dL  GLUCOSE, CAPILLARY     Status: Abnormal   Collection Time    07/28/14  4:52 PM       Result Value Ref Range   Glucose-Capillary 124 (*) 70 - 99 mg/dL  GLUCOSE, CAPILLARY     Status: Abnormal   Collection Time    07/28/14  9:00 PM      Result Value Ref Range   Glucose-Capillary 166 (*) 70 - 99 mg/dL  PROTIME-INR     Status: Abnormal   Collection Time    07/29/14  4:35 AM      Result Value Ref Range   Prothrombin Time 19.3 (*) 11.6 - 15.2 seconds   INR 1.63 (*) 0.00 - 3.33  BASIC METABOLIC PANEL     Status: Abnormal   Collection Time    07/29/14  4:35 AM      Result Value Ref Range   Sodium 134 (*) 137 - 147 mEq/L   Potassium 4.1  3.7 - 5.3 mEq/L   Chloride 94 (*) 96 - 112 mEq/L   CO2 27  19 - 32 mEq/L   Glucose, Bld 146 (*) 70 - 99 mg/dL   BUN 92 (*) 6 -  23 mg/dL   Creatinine, Ser 2.76 (*) 0.50 - 1.10 mg/dL   Calcium 9.3  8.4 - 10.5 mg/dL   GFR calc non Af Amer 17 (*) >90 mL/min   GFR calc Af Amer 20 (*) >90 mL/min   Comment: (NOTE)     The eGFR has been calculated using the CKD EPI equation.     This calculation has not been validated in all clinical situations.     eGFR's persistently <90 mL/min signify possible Chronic Kidney     Disease.   Anion gap 13  5 - 15  CBC     Status: Abnormal   Collection Time    07/29/14  4:35 AM      Result Value Ref Range   WBC 6.4  4.0 - 10.5 K/uL   RBC 3.06 (*) 3.87 - 5.11 MIL/uL   Hemoglobin 9.4 (*) 12.0 - 15.0 g/dL   HCT 29.1 (*) 36.0 - 46.0 %   MCV 95.1  78.0 - 100.0 fL   MCH 30.7  26.0 - 34.0 pg   MCHC 32.3  30.0 - 36.0 g/dL   RDW 18.5 (*) 11.5 - 15.5 %   Platelets 240  150 - 400 K/uL  VANCOMYCIN, TROUGH     Status: None   Collection Time    07/29/14  4:35 AM      Result Value Ref Range   Vancomycin Tr 19.5  10.0 - 20.0 ug/mL  GLUCOSE, CAPILLARY     Status: Abnormal   Collection Time    07/29/14  7:22 AM      Result Value Ref Range   Glucose-Capillary 144 (*) 70 - 99 mg/dL  GLUCOSE, CAPILLARY     Status: Abnormal   Collection Time    07/29/14 12:25 PM      Result Value Ref Range    Glucose-Capillary 169 (*) 70 - 99 mg/dL  GLUCOSE, CAPILLARY     Status: Abnormal   Collection Time    07/29/14  5:08 PM      Result Value Ref Range   Glucose-Capillary 159 (*) 70 - 99 mg/dL  GLUCOSE, CAPILLARY     Status: Abnormal   Collection Time    07/29/14  9:25 PM      Result Value Ref Range   Glucose-Capillary 141 (*) 70 - 99 mg/dL  PROTIME-INR     Status: Abnormal   Collection Time    07/30/14  5:00 AM      Result Value Ref Range   Prothrombin Time 19.3 (*) 11.6 - 15.2 seconds   INR 1.63 (*) 0.00 - 8.18  BASIC METABOLIC PANEL     Status: Abnormal   Collection Time    07/30/14  5:00 AM      Result Value Ref Range   Sodium 133 (*) 137 - 147 mEq/L   Potassium 4.7  3.7 - 5.3 mEq/L   Chloride 93 (*) 96 - 112 mEq/L   CO2 26  19 - 32 mEq/L   Glucose, Bld 138 (*) 70 - 99 mg/dL   BUN 92 (*) 6 - 23 mg/dL   Creatinine, Ser 2.97 (*) 0.50 - 1.10 mg/dL   Calcium 9.0  8.4 - 10.5 mg/dL   GFR calc non Af Amer 16 (*) >90 mL/min   GFR calc Af Amer 18 (*) >90 mL/min   Comment: (NOTE)     The eGFR has been calculated using the CKD EPI equation.     This calculation has not been validated in  all clinical situations.     eGFR's persistently <90 mL/min signify possible Chronic Kidney     Disease.   Anion gap 14  5 - 15  CBC     Status: Abnormal   Collection Time    07/30/14  5:00 AM      Result Value Ref Range   WBC 6.6  4.0 - 10.5 K/uL   RBC 3.01 (*) 3.87 - 5.11 MIL/uL   Hemoglobin 9.3 (*) 12.0 - 15.0 g/dL   HCT 28.9 (*) 36.0 - 46.0 %   MCV 96.0  78.0 - 100.0 fL   MCH 30.9  26.0 - 34.0 pg   MCHC 32.2  30.0 - 36.0 g/dL   RDW 18.7 (*) 11.5 - 15.5 %   Platelets 260  150 - 400 K/uL  GLUCOSE, CAPILLARY     Status: Abnormal   Collection Time    07/30/14  7:12 AM      Result Value Ref Range   Glucose-Capillary 145 (*) 70 - 99 mg/dL    Imaging / Studies: Ct Abdomen Pelvis Wo Contrast  07/28/2014   CLINICAL DATA:  Right side abdominal pain, bacteria in urine  EXAM: CT ABDOMEN AND  PELVIS WITHOUT CONTRAST  TECHNIQUE: Multidetector CT imaging of the abdomen and pelvis was performed following the standard protocol without IV contrast.  COMPARISON:  01/22/2011  FINDINGS: Lung bases are unremarkable. Sagittal images of the spine shows disc space flattening with vacuum disc phenomenon at L5-S1 level. Mild disc space flattening with posterior disc bulge at L4-L5 level. Unenhanced liver shows no biliary ductal dilatation. The patient is status postcholecystectomy. The pancreas, spleen and adrenal glands are unremarkable. Unenhanced kidneys shows no nephrolithiasis. The left kidney is smaller in size. Probable postsurgical changes lower pole of the left kidney. There is a cyst in inferior aspect of the left kidney measures 1.3 cm. No hydronephrosis or hydroureter. No calcified ureteral calculi.  Moderate colonic stool. Oral contrast was given to the patient. No aortic aneurysm. No small bowel obstruction. The terminal ileum is unremarkable. Moderate distended urinary bladder. There is a isodense kidney lesion upper pole of the left kidney measures 1 cm. This cannot be characterized without IV contrast. Further correlation with enhanced CT or MRI is recommended.  Right kidney is mi rotated. Probable cyst in lower pole of the right kidney measures 1.1 cm.  The uterus is unremarkable. No calcified stones are noted within urinary bladder. No inguinal adenopathy. Small bilateral inguinal lymph nodes are noted. No destructive bony lesions are noted within pelvis.  IMPRESSION: 1. No nephrolithiasis.  No hydronephrosis or hydroureter. 2. Isodense exophytic lesion in upper pole of the left kidney measures 1 cm. Further correlation with enhanced CT or MRI is recommended. Probable postsurgical changes lower pole of the left kidney. There is a cyst in inferior aspect of the left kidney measures 1.3 cm. 3. No calcified ureteral calculi. 4. No small bowel obstruction.  Moderate colonic stool. 5. Moderate distended  urinary bladder. 6. Degenerative changes lumbar spine L4-L5 and L5-S1 level.   Electronically Signed   By: Lahoma Crocker M.D.   On: 07/28/2014 16:23    Scheduled Meds: . amiodarone  400 mg Oral BID  . calcitRIOL  0.25 mcg Oral BID  . calcium carbonate  1 tablet Oral QID   And  . cholecalciferol  200 Units Oral QID  . collagenase   Topical Daily  . febuxostat  80 mg Oral Daily  . gabapentin  100 mg Oral BID  .  imipenem-cilastatin  250 mg Intravenous 4 times per day  . insulin aspart  0-15 Units Subcutaneous TID WC  . insulin glargine  28 Units Subcutaneous QHS  . iron polysaccharides  150 mg Oral BID  . levothyroxine  125 mcg Oral QAC breakfast  . linagliptin  5 mg Oral Daily  . metoprolol tartrate  12.5 mg Oral BID  . multivitamin with minerals  1 tablet Oral Daily  . mupirocin ointment   Topical BID  . nitroGLYCERIN  0.4 mg Transdermal QODAY  . potassium chloride SA  20 mEq Oral QID  . saccharomyces boulardii  250 mg Oral BID  . sodium chloride  3 mL Intravenous Q12H  . Vitamin D (Ergocalciferol)  50,000 Units Oral Once per day on Mon Thu  . warfarin  5 mg Oral ONCE-1800  . Warfarin - Pharmacist Dosing Inpatient   Does not apply q1800   Continuous Infusions:  PRN Meds:.sodium chloride, acetaminophen, acetaminophen, albuterol, ALPRAZolam, guaiFENesin-dextromethorphan, nitroGLYCERIN, ondansetron (ZOFRAN) IV, ondansetron, oxyCODONE-acetaminophen, sodium chloride   Antibiotics: Anti-infectives   Start     Dose/Rate Route Frequency Ordered Stop   07/29/14 1000  vancomycin (VANCOCIN) 1,500 mg in sodium chloride 0.9 % 500 mL IVPB     1,500 mg 250 mL/hr over 120 Minutes Intravenous  Once 07/29/14 0541 07/29/14 1238   07/27/14 1200  imipenem-cilastatin (PRIMAXIN) 250 mg in sodium chloride 0.9 % 100 mL IVPB     250 mg 200 mL/hr over 30 Minutes Intravenous 4 times per day 07/27/14 1103     07/27/14 0200  metroNIDAZOLE (FLAGYL) IVPB 500 mg  Status:  Discontinued     500 mg 100 mL/hr  over 60 Minutes Intravenous Every 6 hours 07/26/14 1859 07/27/14 1247   07/26/14 1900  metroNIDAZOLE (FLAGYL) IVPB 500 mg     500 mg 100 mL/hr over 60 Minutes Intravenous STAT 07/26/14 1858 07/26/14 2013   07/26/14 1000  vancomycin (VANCOCIN) 2,000 mg in sodium chloride 0.9 % 500 mL IVPB  Status:  Discontinued     2,000 mg 250 mL/hr over 120 Minutes Intravenous Every 48 hours 07/26/14 0445 07/27/14 1040   07/24/14 1000  vancomycin (VANCOCIN) 2,000 mg in sodium chloride 0.9 % 500 mL IVPB  Status:  Discontinued     2,000 mg 250 mL/hr over 120 Minutes Intravenous Every 24 hours 07/23/14 1914 07/25/14 0914   07/23/14 2200  levofloxacin (LEVAQUIN) IVPB 750 mg  Status:  Discontinued     750 mg 100 mL/hr over 90 Minutes Intravenous Every 48 hours 07/23/14 2059 07/27/14 1040   07/23/14 1930  vancomycin (VANCOCIN) 2,000 mg in sodium chloride 0.9 % 500 mL IVPB     2,000 mg 250 mL/hr over 120 Minutes Intravenous NOW 07/23/14 1842 07/23/14 2111      Assessment/Plan  CHF  PAF on coumadin(INR 1.6)  CKD  Diabetes mellitus  Obesity  LE lymphedema   Bacteroides fragilis bacteremia  LLE wounds/cellulitis  -s/p bedside I&d, wound is tunneling about 9cm at 4 oclock.  It is not being adequately packed.  She may need OR debridement to open up the wound further, however, were concerned with wound healing.  Will discuss with Dr. Barry Dienes and make further recommendations -Hydrotherapy to right and left medial leg wounds. Will proceed with bedside debridement this afternoon  -santyl  -BID wet to dry dressing change to left medial leg  -antibiotics per primary service/ID   Erby Pian, [facility] Surgery Pager [phone] Office [phone]  07/30/2014 11:00 AM

## 2014-07-30 NOTE — Progress Notes (Signed)
Physical Therapy Treatment Patient Details Name: CINNAMON MORENCY MRN: [id number] DOB: 1950/08/10 Today's Date: Aug 17, 2014    History of Present Illness 64 y.o. 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     PT Comments    Pt agreeable to attempt ambulation today however reports weak and "wobbly" LEs so distance limited.  Follow Up Recommendations  Home health PT;Supervision for mobility/OOB     Equipment Recommendations  None recommended by PT    Recommendations for Other Services       Precautions / Restrictions Precautions Precautions: Fall    Mobility  Bed Mobility Overal bed mobility: Needs Assistance Bed Mobility: Supine to Sit;Sit to Supine     Supine to sit: Supervision Sit to supine: Supervision   General bed mobility comments: standby in case of need for LE assist  Transfers Overall transfer level: Needs assistance Equipment used: Rolling walker (2 wheeled) Transfers: Sit to/from Stand Sit to Stand: +2 safety/equipment;Min guard         General transfer comment: uses momentum from lower surfaces  Ambulation/Gait Ambulation/Gait assistance: Min guard;+2 safety/equipment Ambulation Distance (Feet): 15 Feet Assistive device: Rolling walker (2 wheeled) Gait Pattern/deviations: Wide base of support;Trunk flexed;Decreased stride length;Step-through pattern Gait velocity: decr   General Gait Details: fatigues quickly, reports LEs feel weak and wobbly so rest break halfway, recliner following   Stairs            Wheelchair Mobility    Modified Rankin (Stroke Patients Only)       Balance                                    Cognition Arousal/Alertness: Awake/alert Behavior During Therapy: WFL for tasks assessed/performed Overall  Cognitive Status: Within Functional Limits for tasks assessed                      Exercises      General Comments        Pertinent Vitals/Pain Activity to tolerance, reports tender sore LEs    Home Living                      Prior Function            PT Goals (current goals can now be found in the care plan section) Progress towards PT goals: Progressing toward goals    Frequency  Min 3X/week    PT Plan Current plan remains appropriate    Co-evaluation             End of Session Equipment Utilized During Treatment: Gait belt Activity Tolerance: Patient limited by fatigue Patient left: with call bell/phone within reach;in chair     Time: 7939-0300 PT Time Calculation (min): 14 min  Charges:  $Gait Training: 8-22 mins                    G Codes:      Cheyan Frees,KATHrine E 08/17/14, 12:53 PM Carmelia Bake, PT, DPT 08/17/14 Pager: [phone]

## 2014-07-30 NOTE — Progress Notes (Addendum)
ANTICOAGULATION CONSULT NOTE - Follow Up  Pharmacy Consult for warfarin Indication: afib   Allergies  Allergen Reactions  . Ivp Dye [Iodinated Diagnostic Agents] Shortness Of Breath    Turn red, can't breathe  . Betadine [Povidone Iodine]   . Diphenhydramine Hcl Swelling    In hands and eyes  . Fish Allergy Nausea And Vomiting  . Iohexol      Desc: PT TURNS RED AND WHEEZING   . Penicillins Other (See Comments)    Resp arrest as child  . Povidone-Iodine Other (See Comments)    Wheezing, turn red, can't breath, and blisters  . Promethazine Hcl Other (See Comments)    Low Blood Pressure  . Tape     Blisters, can use paper tape for short periods  . Clindamycin Hives and Rash    wheezing  . Iodine Rash  . Morphine Sulfate Nausea And Vomiting and Rash    Patient Measurements: Height: 5\' 6"  (167.6 cm) Weight: 331 lb 3.2 oz (150.231 kg) IBW/kg (Calculated) : 59.3 Vital Signs: Temp: 97.5 F (36.4 C) (07/31 0611) Temp src: Oral (07/31 0611) BP: 91/52 mmHg (07/31 0611) Pulse Rate: 69 (07/31 0611)  Labs:  Recent Labs  07/28/14 0448 07/29/14 0435 07/30/14 0500  HGB 9.4* 9.4* 9.3*  HCT 29.7* 29.1* 28.9*  PLT 221 240 260  LABPROT 20.5* 19.3* 19.3*  INR 1.76* 1.63* 1.63*  CREATININE 2.80* 2.76* 2.97*    Estimated Creatinine Clearance: 28.5 ml/min (by C-G formula based on Cr of 2.97).   Assessment: 49 YOF on chronic warfarin/amiodarone therapy for afib. INR supratherapeutic at admission.   Home dose 5mg  daily with 2.5mg  MWF  Today's INR SUBtherapeutic and unchanged from 7/30 - 7.5mg  given last 2 days but also note that 5mg  was supposed to have been given on 7/28 but was never ordered and therefore patient did not receive a dose of warfarin on 7/28 which could be contributing to dropping/unchanged INR now  CBC: stable. No bleeding reported per chart notes.  BLE dopplers neg for DVT  Diet: heart healthy, eating 50-100% of meals  Drug interactions:  Chronic  amiodarone - dosage increased in May outpatient, possible contribution to elevated INR as effects are typically seen weeks/months into therapy  Goal of Therapy:  INR 2-3   Plan:   I expect INR to start rising again tomorrow so will give Warfarin 5mg  x1 today  Daily INR  Adrian Saran, PharmD, BCPS Pager [phone] 07/30/2014 8:22 AM

## 2014-07-30 NOTE — Progress Notes (Signed)
Seen, agree with above. May need operative debridement.  Will make further decision after evaluating wound later Continue antibiotics for cellulitis.

## 2014-07-30 NOTE — Progress Notes (Addendum)
[facility] PROGRESS NOTE  [name] CBJ:[id number] DOB: 06-Jun-1950 DOA: 07/23/2014 PCP: Elby Showers, MD  Assessment/Plan  64 y.o. 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  Pt developed a blister at multiple spots in her bilateral lower legs on Tuesday, following which she noted that her legs were very erythematous. Some of the blisters have spontaneously drained, she went to the wound care center where she had a small I&D done on the left lower medial thigh and she was sent to the ED for further evaluation.  1. Cellulitis and abscess of bilateral lower extremity due to severe lymphedema with progressive draining ulcers due to MDR S. aureus.  Left leg medial ulcer debrided by gen surg on 7/30.   -  Vancomycin d/c'd 7/30 by ID -  Continue Imipenem, day 6 of antibiotics -  Appreciate wound care assistance -  CT leg:  Soft tissue edema within both thighs consistent with cellulitis, no focal underlying fluid collection or evidence of osteomyelitis -  Leg [place]: no DVT  -  Appreciate general surgery assistance:  May need further debridement of leg wound and wound vac placement.  2. Bacteremia/sepsis due to beta-lactamase bacteriodes fragilis - 7/24: blood cultures + BACTEROIDES FRAGILIS -  CT scan abd/pelvis to eval for gut source:  No obvious source of infection - 7/28:  blood cultures NGTD -  Appreciate ID assistance - Continue Imipenem, day 4 of 14   2. Type 2 diabetes mellitus Ha1c-7.0 (02/2014), CBG well controlled -  Continue lantus 28 -  Cont SSI -  Hold gliburide  3. PAF (paroxysmal atrial fibrillation), paced rhythm on tele, rate controlled.  Tele d/c'd 7/29 -  Coumadin dosed by pharmacy -  INR subtherapeutic but rising  4. Chronic CHF, systolic HF, with dilated  Cardiomyopathy, nonischemic, compensated -  Echo (2014): LVEF 30-35%, Diffuse Hypokinesis, severe MR, dilated LA, RA, RA, LV, TR, pulmonary HTN -  Continue to hold Demadex due to rising BUN:Cr and mild hypotension -  Daily weights:  Wt stable  Hypotension likely due to dehydration -  Hold diuretics -  Reduce dose of BB (already has hold parameter for BP)  5. Hypothyroidism, acquired, autoimmune Continue levothyroxine   6. Hypocalcemia Continue with calcium, vitamin D supplementation. Monitor lites periodically   7. CKD (chronic kidney disease) stage 4, GFR 15-29 ml/min; h/o nephrectomy, creatinine rising slightly despite holding diuretics, but drinking well -  Minimize nephrotoxins and renally dose medications -  Hold diuretics  Left renal exophytic mass.  Unable to perform CT/MRI with contrast due to CKD and [place] unlikely to be helpful given body habitus.   -  Repeat imaging in 3-6 months  8. Obesity hypoventilation syndrome, stable.  CPAP each bedtime   9. OBESITY, MORBID; cont encourage weigh loss, diet exercise   10. Leg pain, neuropathy, stable.  Continue neurontin and prn opioids  11. H/o IDA: no acute s/s of bleeding; h/o MGUS -  Iron studies wnl -  Defer to PCP/hematology  Diet:  Healthy heart/diabetic Access:  PIV IVF:  Off Proph:  Coumadin  Code Status: Full Family Communication: patient alone Disposition Plan:  Pending possible further debridement of left leg wound.     Consultants:  Infectious disease  General surgery  Procedures:  CT lower extremity  Duplex lower extremity  Antibiotics: vanc  7/24 >> 7/28 Levofloxacin 7/24 >>> 7/28  Metronidazole 7/27>>> 7/28  Imipenem 7/28 >>>   HPI/Subjective:  Denies SOB, nausea, vomiting, diarrhea.  Legs less red.  Tearful and feels guilty about falling.    Objective: Filed Vitals:   07/29/14 0600 07/29/14 1537 07/29/14 2125 07/30/14 0611  BP: 94/50 95/42 84/50  91/52  Pulse: 65 72 65 69  Temp: 98.1 F  (36.7 C) 97.4 F (36.3 C) 98.2 F (36.8 C) 97.5 F (36.4 C)  TempSrc: Oral Axillary Oral Oral  Resp: 18 18 18 18   Height:      Weight:    150.231 kg (331 lb 3.2 oz)  SpO2: 96% 100% 100% 100%    Intake/Output Summary (Last 24 hours) at 07/30/14 0946 Last data filed at 07/30/14 0600  Gross per 24 hour  Intake   1250 ml  Output   1900 ml  Net   -650 ml   Filed Weights   07/28/14 0438 07/29/14 0441 07/30/14 0611  Weight: 150.141 kg (331 lb) 150.367 kg (331 lb 8 oz) 150.231 kg (331 lb 3.2 oz)    Exam:   General:  Obese CF, No acute distress  HEENT:  NCAT, MMM  Cardiovascular:  RRR, nl S1, S2 no mrg, 2+ pulses, warm extremities  Respiratory:  CTAB, no increased WOB  Abdomen:   NABS, soft, NT/ND  MSK:   Normal tone and bulk, bilateral lymphedema.  Dressing in place over ulcers today and did not remove to examine.  Erythema continuing to recede and become less pink on both legs.      Neuro:  Grossly intact but diffusely weak.    Data Reviewed: Basic Metabolic Panel:  Recent Labs Lab 07/26/14 0400 07/27/14 0438 07/28/14 0448 07/29/14 0435 07/30/14 0500  NA 135* 135* 134* 134* 133*  K 4.4 4.0 4.2 4.1 4.7  CL 93* 94* 92* 94* 93*  CO2 29 27 29 27 26   GLUCOSE 123* 131* 174* 146* 138*  BUN 79* 87* 91* 92* 92*  CREATININE 2.68* 2.72* 2.80* 2.76* 2.97*  CALCIUM 8.8 9.0 9.3 9.3 9.0   Liver Function Tests:  Recent Labs Lab 07/24/14 0413  AST 19  ALT 18  ALKPHOS 127*  BILITOT 1.0  PROT 7.8  ALBUMIN 2.5*   No results found for this basename: LIPASE, AMYLASE,  in the last 168 hours No results found for this basename: AMMONIA,  in the last 168 hours CBC:  Recent Labs Lab 07/23/14 1803  07/26/14 0400 07/27/14 0438 07/28/14 0448 07/29/14 0435 07/30/14 0500  WBC 5.7  < > 4.6 4.8 6.2 6.4 6.6  NEUTROABS 4.9  --   --   --   --   --   --   HGB 10.2*  < > 8.6* 8.5* 9.4* 9.4* 9.3*  HCT 31.2*  < > 26.9* 26.9* 29.7* 29.1* 28.9*  MCV 96.0  < > 96.8 96.8 97.4 95.1  96.0  PLT 250  < > 154 171 221 240 260  < > = values in this interval not displayed. Cardiac Enzymes: No results found for this basename: CKTOTAL, CKMB, CKMBINDEX, TROPONINI,  in the last 168 hours BNP (last 3 results) No results found for this basename: PROBNP,  in the last 8760 hours CBG:  Recent Labs Lab 07/29/14 0722 07/29/14 1225 07/29/14 1708 07/29/14 2125 07/30/14 0712  GLUCAP 144* 169* 159* 141* 145*    Recent Results (from the past 240 hour(s))  CULTURE, BLOOD (ROUTINE X 2)     Status: None  Collection Time    07/23/14  6:35 PM      Result Value Ref Range Status   Specimen Description BLOOD LEFT ANTECUBITAL   Final   Special Requests BOTTLES DRAWN AEROBIC AND ANAEROBIC 5ML   Final   Culture  Setup Time     Final   Value: 07/23/2014 22:16     Performed at [facility]   Culture     Final   Value: BACTEROIDES FRAGILIS     Note: BETA LACTAMASE POSITIVE     Note: Gram Stain Report Called to,Read Back By and Verified With: Rulon Abide RN on 07/24/14 at 20:45 by Rise Mu     Performed at [facility]   Report Status 07/26/2014 FINAL   Final  CULTURE, BLOOD (ROUTINE X 2)     Status: None   Collection Time    07/23/14  6:38 PM      Result Value Ref Range Status   Specimen Description BLOOD RIGHT ANTECUBITAL   Final   Special Requests BOTTLES DRAWN AEROBIC AND ANAEROBIC 5ML   Final   Culture  Setup Time     Final   Value: 07/23/2014 22:16     Performed at [facility]   Culture     Final   Value: BACTEROIDES FRAGILIS     Note: CRITICAL RESULT CALLED TO, READ BACK BY AND VERIFIED WITH: JENNY BURNS RN @1015PM  Thelma Comp [id number] BETA LACTAMASE POSITIVE     Performed at [facility]   Report Status 07/26/2014 FINAL   Final  WOUND CULTURE     Status: None   Collection Time    07/24/14  3:44 PM      Result Value Ref Range Status   Specimen Description LEG   Final   Special Requests Normal   Final   Gram Stain     Final   Value: RARE  SQUAMOUS EPITHELIAL CELLS PRESENT     RARE WBC PRESENT, PREDOMINANTLY PMN     FEW GRAM POSITIVE COCCI IN PAIRS     FEW GRAM NEGATIVE RODS     Performed at [facility]   Culture     Final   Value: FEW STAPHYLOCOCCUS AUREUS     Note: RIFAMPIN AND GENTAMICIN SHOULD NOT BE USED AS SINGLE DRUGS FOR TREATMENT OF STAPH INFECTIONS.     Performed at [facility]   Report Status 07/29/2014 FINAL   Final   Organism ID, Bacteria STAPHYLOCOCCUS AUREUS   Final  CULTURE, BLOOD (ROUTINE X 2)     Status: None   Collection Time    07/27/14 11:15 AM      Result Value Ref Range Status   Specimen Description BLOOD LEFT ARM   Final   Special Requests BOTTLES DRAWN AEROBIC AND ANAEROBIC 10CC EACH   Final   Culture  Setup Time     Final   Value: 07/27/2014 13:54     Performed at [facility]   Culture     Final   Value:        BLOOD CULTURE RECEIVED NO GROWTH TO DATE CULTURE WILL BE HELD FOR 5 DAYS BEFORE ISSUING A FINAL NEGATIVE REPORT     Performed at [facility]   Report Status PENDING   Incomplete  CULTURE, BLOOD (ROUTINE X 2)     Status: None   Collection Time    07/27/14 11:20 AM      Result Value Ref Range Status   Specimen  Description BLOOD RIGHT ARM   Final   Special Requests BOTTLES DRAWN AEROBIC AND ANAEROBIC 10CC EACH   Final   Culture  Setup Time     Final   Value: 07/27/2014 13:54     Performed at [facility]   Culture     Final   Value:        BLOOD CULTURE RECEIVED NO GROWTH TO DATE CULTURE WILL BE HELD FOR 5 DAYS BEFORE ISSUING A FINAL NEGATIVE REPORT     Performed at [facility]   Report Status PENDING   Incomplete     Studies: Ct Abdomen Pelvis Wo Contrast  07/28/2014   CLINICAL DATA:  Right side abdominal pain, bacteria in urine  EXAM: CT ABDOMEN AND PELVIS WITHOUT CONTRAST  TECHNIQUE: Multidetector CT imaging of the abdomen and pelvis was performed following the standard protocol without IV contrast.  COMPARISON:  01/22/2011   FINDINGS: Lung bases are unremarkable. Sagittal images of the spine shows disc space flattening with vacuum disc phenomenon at L5-S1 level. Mild disc space flattening with posterior disc bulge at L4-L5 level. Unenhanced liver shows no biliary ductal dilatation. The patient is status postcholecystectomy. The pancreas, spleen and adrenal glands are unremarkable. Unenhanced kidneys shows no nephrolithiasis. The left kidney is smaller in size. Probable postsurgical changes lower pole of the left kidney. There is a cyst in inferior aspect of the left kidney measures 1.3 cm. No hydronephrosis or hydroureter. No calcified ureteral calculi.  Moderate colonic stool. Oral contrast was given to the patient. No aortic aneurysm. No small bowel obstruction. The terminal ileum is unremarkable. Moderate distended urinary bladder. There is a isodense kidney lesion upper pole of the left kidney measures 1 cm. This cannot be characterized without IV contrast. Further correlation with enhanced CT or MRI is recommended.  Right kidney is mi rotated. Probable cyst in lower pole of the right kidney measures 1.1 cm.  The uterus is unremarkable. No calcified stones are noted within urinary bladder. No inguinal adenopathy. Small bilateral inguinal lymph nodes are noted. No destructive bony lesions are noted within pelvis.  IMPRESSION: 1. No nephrolithiasis.  No hydronephrosis or hydroureter. 2. Isodense exophytic lesion in upper pole of the left kidney measures 1 cm. Further correlation with enhanced CT or MRI is recommended. Probable postsurgical changes lower pole of the left kidney. There is a cyst in inferior aspect of the left kidney measures 1.3 cm. 3. No calcified ureteral calculi. 4. No small bowel obstruction.  Moderate colonic stool. 5. Moderate distended urinary bladder. 6. Degenerative changes lumbar spine L4-L5 and L5-S1 level.   Electronically Signed   By: Lahoma Crocker M.D.   On: 07/28/2014 16:23    Scheduled Meds: .  amiodarone  400 mg Oral BID  . calcitRIOL  0.25 mcg Oral BID  . calcium carbonate  1 tablet Oral QID   And  . cholecalciferol  200 Units Oral QID  . collagenase   Topical Daily  . febuxostat  80 mg Oral Daily  . gabapentin  100 mg Oral BID  . imipenem-cilastatin  250 mg Intravenous 4 times per day  . insulin aspart  0-15 Units Subcutaneous TID WC  . insulin glargine  28 Units Subcutaneous QHS  . iron polysaccharides  150 mg Oral BID  . levothyroxine  125 mcg Oral QAC breakfast  . linagliptin  5 mg Oral Daily  . metoprolol tartrate  12.5 mg Oral BID  . multivitamin with minerals  1 tablet Oral Daily  .  mupirocin ointment   Topical BID  . nitroGLYCERIN  0.4 mg Transdermal QODAY  . potassium chloride SA  20 mEq Oral QID  . saccharomyces boulardii  250 mg Oral BID  . sodium chloride  3 mL Intravenous Q12H  . Vitamin D (Ergocalciferol)  50,000 Units Oral Once per day on Mon Thu  . warfarin  5 mg Oral ONCE-1800  . Warfarin - Pharmacist Dosing Inpatient   Does not apply q1800   Continuous Infusions:   Active Problems:   OBESITY, MORBID   HYPERTENSION   PACEMAKER, PERMANENT   Obesity hypoventilation syndrome   Hypothyroidism, acquired, autoimmune   Solitary kidney, acquired   Cardiomyopathy, nonischemic- EF 30-35% June 2014   Type 2 diabetes mellitus   CKD (chronic kidney disease) stage 4, GFR 15-29 ml/min   PAF (paroxysmal atrial fibrillation)   Hypocalcemia   Cellulitis and abscess    Time spent: 30 min    Jeraldin Fesler, [facility] Pager [phone]. If 7PM-7AM, please contact night-coverage at [email or web address], password [facility] 07/30/2014, 9:46 AM  LOS: 7 days

## 2014-07-31 LAB — CBC
HCT: 29.6 % — ABNORMAL LOW (ref 36.0–46.0)
Hemoglobin: 9.5 g/dL — ABNORMAL LOW (ref 12.0–15.0)
MCH: 30.9 pg (ref 26.0–34.0)
MCHC: 32.1 g/dL (ref 30.0–36.0)
MCV: 96.4 fL (ref 78.0–100.0)
PLATELETS: 257 10*3/uL (ref 150–400)
RBC: 3.07 MIL/uL — AB (ref 3.87–5.11)
RDW: 19.2 % — ABNORMAL HIGH (ref 11.5–15.5)
WBC: 6.7 10*3/uL (ref 4.0–10.5)

## 2014-07-31 LAB — BASIC METABOLIC PANEL
ANION GAP: 9 (ref 5–15)
BUN: 93 mg/dL — ABNORMAL HIGH (ref 6–23)
CO2: 29 mEq/L (ref 19–32)
Calcium: 9.3 mg/dL (ref 8.4–10.5)
Chloride: 97 mEq/L (ref 96–112)
Creatinine, Ser: 3 mg/dL — ABNORMAL HIGH (ref 0.50–1.10)
GFR, EST AFRICAN AMERICAN: 18 mL/min — AB (ref >90)
GFR, EST NON AFRICAN AMERICAN: 15 mL/min — AB (ref >90)
Glucose, Bld: 137 mg/dL — ABNORMAL HIGH (ref 70–99)
Potassium: 5.5 mEq/L — ABNORMAL HIGH (ref 3.7–5.3)
SODIUM: 135 meq/L — AB (ref 137–147)

## 2014-07-31 LAB — GLUCOSE, CAPILLARY
GLUCOSE-CAPILLARY: 152 mg/dL — AB (ref 70–99)
GLUCOSE-CAPILLARY: 161 mg/dL — AB (ref 70–99)
Glucose-Capillary: 205 mg/dL — ABNORMAL HIGH (ref 70–99)
Glucose-Capillary: 142 mg/dL — ABNORMAL HIGH (ref 70–99)

## 2014-07-31 LAB — PROTIME-INR
INR: 1.72 — ABNORMAL HIGH (ref 0.00–1.49)
PROTHROMBIN TIME: 20.2 s — AB (ref 11.6–15.2)

## 2014-07-31 MED ORDER — WARFARIN SODIUM 5 MG PO TABS
5.0000 mg | ORAL_TABLET | Freq: Once | ORAL | Status: AC
  Administered 2014-07-31: 5 mg via ORAL
  Filled 2014-07-31: qty 1

## 2014-07-31 NOTE — Progress Notes (Signed)
Patient ID: [name], female   DOB: 01/06/1950, 64 y.o.   MRN: [id number] [facility] Progress Note:   * No surgery found *  Subjective: Mental status is clear;  Discussed her history with wound center and with her fall Objective: Vital signs in last 24 hours: Temp:  [97.4 F (36.3 C)] 97.4 F (36.3 C) (07/31 2200) Pulse Rate:  [70-72] 70 (07/31 2200) Resp:  [16] 16 (07/31 2200) BP: (82-114)/(59-68) 114/59 mmHg (07/31 2200) SpO2:  [95 %-99 %] 95 % (07/31 2200)  Intake/Output from previous day: 07/31 0701 - 08/01 0700 In: 300 [IV Piggyback:300] Out: 300 [Urine:300] Intake/Output this shift: Total I/O In: 360 [P.O.:360] Out: 400 [Urine:400]  Physical Exam: Work of breathing is normal.  Bilateral severe lymphedema with bilateral areas of cellulitis that she says are better (on primaxin and vancomycin).  The proximal area on her thigh has been opened by Dr. Jerline Pain in the wound center and according to her the surrounding redness is largely gone.  She has some blisters on the left leg on which I applied some mucopuricin.  Would continue this.    Lab Results:  Results for orders placed during the hospital encounter of 07/23/14 (from the past 48 hour(s))  GLUCOSE, CAPILLARY     Status: Abnormal   Collection Time    07/29/14 12:25 PM      Result Value Ref Range   Glucose-Capillary 169 (*) 70 - 99 mg/dL  GLUCOSE, CAPILLARY     Status: Abnormal   Collection Time    07/29/14  5:08 PM      Result Value Ref Range   Glucose-Capillary 159 (*) 70 - 99 mg/dL  GLUCOSE, CAPILLARY     Status: Abnormal   Collection Time    07/29/14  9:25 PM      Result Value Ref Range   Glucose-Capillary 141 (*) 70 - 99 mg/dL  PROTIME-INR     Status: Abnormal   Collection Time    07/30/14  5:00 AM      Result Value Ref Range   Prothrombin Time 19.3 (*) 11.6 - 15.2 seconds   INR 1.63 (*) 0.00 - 3.08  BASIC METABOLIC PANEL     Status: Abnormal   Collection Time    07/30/14  5:00 AM   Result Value Ref Range   Sodium 133 (*) 137 - 147 mEq/L   Potassium 4.7  3.7 - 5.3 mEq/L   Chloride 93 (*) 96 - 112 mEq/L   CO2 26  19 - 32 mEq/L   Glucose, Bld 138 (*) 70 - 99 mg/dL   BUN 92 (*) 6 - 23 mg/dL   Creatinine, Ser 2.97 (*) 0.50 - 1.10 mg/dL   Calcium 9.0  8.4 - 10.5 mg/dL   GFR calc non Af Amer 16 (*) >90 mL/min   GFR calc Af Amer 18 (*) >90 mL/min   Comment: (NOTE)     The eGFR has been calculated using the CKD EPI equation.     This calculation has not been validated in all clinical situations.     eGFR's persistently <90 mL/min signify possible Chronic Kidney     Disease.   Anion gap 14  5 - 15  CBC     Status: Abnormal   Collection Time    07/30/14  5:00 AM      Result Value Ref Range   WBC 6.6  4.0 - 10.5 K/uL   RBC 3.01 (*) 3.87 - 5.11 MIL/uL  Hemoglobin 9.3 (*) 12.0 - 15.0 g/dL   HCT 28.9 (*) 36.0 - 46.0 %   MCV 96.0  78.0 - 100.0 fL   MCH 30.9  26.0 - 34.0 pg   MCHC 32.2  30.0 - 36.0 g/dL   RDW 18.7 (*) 11.5 - 15.5 %   Platelets 260  150 - 400 K/uL  GLUCOSE, CAPILLARY     Status: Abnormal   Collection Time    07/30/14  7:12 AM      Result Value Ref Range   Glucose-Capillary 145 (*) 70 - 99 mg/dL  GLUCOSE, CAPILLARY     Status: Abnormal   Collection Time    07/30/14 12:30 PM      Result Value Ref Range   Glucose-Capillary 151 (*) 70 - 99 mg/dL  GLUCOSE, CAPILLARY     Status: Abnormal   Collection Time    07/30/14  5:07 PM      Result Value Ref Range   Glucose-Capillary 187 (*) 70 - 99 mg/dL  GLUCOSE, CAPILLARY     Status: Abnormal   Collection Time    07/30/14 10:06 PM      Result Value Ref Range   Glucose-Capillary 196 (*) 70 - 99 mg/dL  PROTIME-INR     Status: Abnormal   Collection Time    07/31/14  4:42 AM      Result Value Ref Range   Prothrombin Time 20.2 (*) 11.6 - 15.2 seconds   INR 1.72 (*) 0.00 - 6.21  BASIC METABOLIC PANEL     Status: Abnormal   Collection Time    07/31/14  4:42 AM      Result Value Ref Range   Sodium 135 (*)  137 - 147 mEq/L   Potassium 5.5 (*) 3.7 - 5.3 mEq/L   Chloride 97  96 - 112 mEq/L   CO2 29  19 - 32 mEq/L   Glucose, Bld 137 (*) 70 - 99 mg/dL   BUN 93 (*) 6 - 23 mg/dL   Creatinine, Ser 3.00 (*) 0.50 - 1.10 mg/dL   Calcium 9.3  8.4 - 10.5 mg/dL   GFR calc non Af Amer 15 (*) >90 mL/min   GFR calc Af Amer 18 (*) >90 mL/min   Comment: (NOTE)     The eGFR has been calculated using the CKD EPI equation.     This calculation has not been validated in all clinical situations.     eGFR's persistently <90 mL/min signify possible Chronic Kidney     Disease.   Anion gap 9  5 - 15  CBC     Status: Abnormal   Collection Time    07/31/14  4:42 AM      Result Value Ref Range   WBC 6.7  4.0 - 10.5 K/uL   RBC 3.07 (*) 3.87 - 5.11 MIL/uL   Hemoglobin 9.5 (*) 12.0 - 15.0 g/dL   HCT 29.6 (*) 36.0 - 46.0 %   MCV 96.4  78.0 - 100.0 fL   MCH 30.9  26.0 - 34.0 pg   MCHC 32.1  30.0 - 36.0 g/dL   RDW 19.2 (*) 11.5 - 15.5 %   Platelets 257  150 - 400 K/uL  GLUCOSE, CAPILLARY     Status: Abnormal   Collection Time    07/31/14  7:14 AM      Result Value Ref Range   Glucose-Capillary 152 (*) 70 - 99 mg/dL    Radiology/Results: No results found.  Anti-infectives: Anti-infectives  Start     Dose/Rate Route Frequency Ordered Stop   07/29/14 1000  vancomycin (VANCOCIN) 1,500 mg in sodium chloride 0.9 % 500 mL IVPB     1,500 mg 250 mL/hr over 120 Minutes Intravenous  Once 07/29/14 0541 07/29/14 1238   07/27/14 1200  imipenem-cilastatin (PRIMAXIN) 250 mg in sodium chloride 0.9 % 100 mL IVPB     250 mg 200 mL/hr over 30 Minutes Intravenous 4 times per day 07/27/14 1103     07/27/14 0200  metroNIDAZOLE (FLAGYL) IVPB 500 mg  Status:  Discontinued     500 mg 100 mL/hr over 60 Minutes Intravenous Every 6 hours 07/26/14 1859 07/27/14 1247   07/26/14 1900  metroNIDAZOLE (FLAGYL) IVPB 500 mg     500 mg 100 mL/hr over 60 Minutes Intravenous STAT 07/26/14 1858 07/26/14 2013   07/26/14 1000  vancomycin  (VANCOCIN) 2,000 mg in sodium chloride 0.9 % 500 mL IVPB  Status:  Discontinued     2,000 mg 250 mL/hr over 120 Minutes Intravenous Every 48 hours 07/26/14 0445 07/27/14 1040   07/24/14 1000  vancomycin (VANCOCIN) 2,000 mg in sodium chloride 0.9 % 500 mL IVPB  Status:  Discontinued     2,000 mg 250 mL/hr over 120 Minutes Intravenous Every 24 hours 07/23/14 1914 07/25/14 0914   07/23/14 2200  levofloxacin (LEVAQUIN) IVPB 750 mg  Status:  Discontinued     750 mg 100 mL/hr over 90 Minutes Intravenous Every 48 hours 07/23/14 2059 07/27/14 1040   07/23/14 1930  vancomycin (VANCOCIN) 2,000 mg in sodium chloride 0.9 % 500 mL IVPB     2,000 mg 250 mL/hr over 120 Minutes Intravenous NOW 07/23/14 1842 07/23/14 2111      Assessment/Plan: Problem List: Patient Active Problem List   Diagnosis Date Noted  . Cellulitis and abscess 07/23/2014  . Mitral insufficiency, moderate to severe 05/25/2014  . Hypocalcemia 03/09/2014  . Ulcers of both lower extremities 03/08/2014  . Anemia 09/20/2013  . Cellulitis of lower leg 06/11/2013  . Hypokalemia, replacing 06/10/2013  . CKD (chronic kidney disease) stage 4, GFR 15-29 ml/min 06/05/2013  . Ulcers of both lower extremities, small, now with cellulitis 06/05/2013  . PAF (paroxysmal atrial fibrillation) 06/05/2013  . Long term (current) use of anticoagulants 03/19/2013  . Chronic combined systolic and diastolic HF (heart failure) 11/05/2012  . Type 2 diabetes mellitus 10/15/2012  . Endometrial hyperplasia with atypia - s/p Mirena IUD 04/2012 - IUD removed 05/19/2012  . Cardiomyopathy, nonischemic- EF 30-35% June 2014 05/02/2012  . Infected traumatic leg ulcer 03/31/2012  . Goiter   . Thyroiditis, autoimmune   . Hypothyroidism, acquired, autoimmune   . Solitary kidney, acquired   . Fatigue   . Hyperparathyroidism, secondary   . Vitamin D deficiency disease   . Obesity hypoventilation syndrome 09/09/2011  . Fibromyalgia syndrome 08/30/2011  .  Monoclonal gammopathy of unknown significance 08/06/2011  . DYSPNEA 11/23/2008  . HASHIMOTO'S THYROIDITIS 05/17/2008  . DIABETES MELLITUS 05/17/2008  . OBESITY, MORBID 05/17/2008  . OBSTRUCTIVE SLEEP APNEA 05/17/2008  . HYPERTENSION 05/17/2008  . HYPERTENSION, PULMONARY 05/17/2008  . PACEMAKER, PERMANENT 05/17/2008    Continue topical care and IV antibiotics and leg elevation.  Would observe for now.  No I & D or debridement for now * No surgery found *    LOS: 8 days   Matt B. Hassell Done, MD, [facility] [phone] beeper [phone]  07/31/2014 9:48 AM

## 2014-07-31 NOTE — Progress Notes (Signed)
ANTICOAGULATION CONSULT NOTE - Follow Up  Pharmacy Consult for warfarin Indication: afib   Allergies  Allergen Reactions  . Ivp Dye [Iodinated Diagnostic Agents] Shortness Of Breath    Turn red, can't breathe  . Betadine [Povidone Iodine]   . Diphenhydramine Hcl Swelling    In hands and eyes  . Fish Allergy Nausea And Vomiting  . Iohexol      Desc: PT TURNS RED AND WHEEZING   . Penicillins Other (See Comments)    Resp arrest as child  . Povidone-Iodine Other (See Comments)    Wheezing, turn red, can't breath, and blisters  . Promethazine Hcl Other (See Comments)    Low Blood Pressure  . Tape     Blisters, can use paper tape for short periods  . Clindamycin Hives and Rash    wheezing  . Iodine Rash  . Morphine Sulfate Nausea And Vomiting and Rash    Patient Measurements: Height: 5\' 6"  (167.6 cm) Weight: 331 lb 3.2 oz (150.231 kg) IBW/kg (Calculated) : 59.3 Vital Signs: Pulse Rate: 88 (08/01 0958)  Labs:  Recent Labs  07/29/14 0435 07/30/14 0500 07/31/14 0442  HGB 9.4* 9.3* 9.5*  HCT 29.1* 28.9* 29.6*  PLT 240 260 257  LABPROT 19.3* 19.3* 20.2*  INR 1.63* 1.63* 1.72*  CREATININE 2.76* 2.97* 3.00*    Estimated Creatinine Clearance: 28.2 ml/min (by C-G formula based on Cr of 3).   Assessment: 59 YOF on chronic warfarin/amiodarone therapy for afib. INR supratherapeutic at admission.  I/D of LLE cellulitis yesterday, will likely need additional in coming days   Home dose 5mg  daily with 2.5mg  MWF  Missed 5 mg dose on 5/28  INR today SUBtherapeutic  CBC: stable. No bleeding reported per nursing.  BLE dopplers neg for DVT  Diet: heart healthy, eating 50-100% of meals  Drug interactions:  Chronic amiodarone - dosage increased in May outpatient, possible contribution to elevated INR as effects are typically seen weeks/months into therapy  Goal of Therapy:  INR 2-3   Plan:  Weighing bleed risk of potential future I/Ds vs annualized clot risk for  Afib (CHADS2=2), would aim for INR low-therapeutic for now  Warfarin 5 mg x1 today.  If INR < 1.9 tomorrow, would give 7.5 mg tomorrow  Daily INR  Reuel Boom, PharmD [phone] 07/31/2014 10:41 AM

## 2014-07-31 NOTE — Progress Notes (Signed)
07/31/14 1200  Subjective Assessment  Subjective pt frustrated with new blisters popping up left thigh  Patient and Family Stated Goals healing LE wounds  Prior Treatments bedside debridement  Evaluation and Treatment  Evaluation and Treatment Procedures Explained to Patient/Family Yes  Evaluation and Treatment Procedures agreed to  Wound / Incision (Open or Dehisced) 07/29/14 Other (Comment) Thigh Left;Medial HYDRO  Date First Assessed/Time First Assessed: 07/29/14 1125   Wound Type: (c) Other (Comment)  Location: Thigh  Location Orientation: Left;Medial  Wound Description (Comments): HYDRO  Present on Admission: Yes  Dressing Type Transparent dressing;Moist to dry  Dressing Status None  Site / Wound Assessment Bleeding;Red;Yellow  % Wound base Red or Granulating 20%  % Wound base Yellow 80%  Peri-wound Assessment Erythema (non-blanchable)  Drainage Amount Moderate (per pt)  Drainage Description Serosanguineous  Non-staged Wound Description Not applicable  Treatment Cleansed;Debridement (Selective);Hydrotherapy (Pulse lavage);Packing (Saline gauze)  Hydrotherapy  Pulsed Lavage with Suction (psi) 8 psi  Pulsed Lavage with Suction - Normal Saline Used 1000 mL  Pulsed Lavage Tip Tip with splash shield  Pulsed lavage therapy - wound location L medial thigh  Selective Debridement  Selective Debridement - Location L medial thigh wound  Selective Debridement - Tools Used Scissors;Forceps  Selective Debridement - Tissue Removed yellow slough  Wound Therapy - Assess/Plan/Recommendations  Wound Therapy - Clinical Statement Pt would benefit from hydrotherapy to assist with removing necrotic tissue and promote wound healing environment.  Factors Delaying/Impairing Wound Healing Diabetes Mellitus;Immobility;Multiple medical problems  Hydrotherapy Plan Debridement;Dressing change;Patient/family education  Wound Therapy - Frequency 6X / week  Wound Therapy - Follow Up Recommendations Coats  Wound Plan To perform hydrotherapy to L medial open thigh wound to remove necrotic tissue and facilitate wound healing.  Wound Therapy Goals - Improve the function of patient's integumentary system by progressing the wound(s) through the phases of wound healing by:  Decrease Necrotic Tissue to 50%  Decrease Necrotic Tissue - Progress Progressing toward goal  Increase Granulation Tissue to 50%  Increase Granulation Tissue - Progress Progressing toward goal  Improve Drainage Characteristics Min  Improve Drainage Characteristics - Progress Progressing toward goal  Goals/treatment plan/discharge plan were made with and agreed upon by patient/family Yes  Time For Goal Achievement 2 weeks  Wound Therapy - Potential for Goals Good

## 2014-07-31 NOTE — Plan of Care (Signed)
Problem: Phase II Progression Outcomes Goal: Wound without signs/symptoms of infection, decreasing edema Outcome: Not Met (add Reason) Culture sent on weeping leg wounds. With blisters

## 2014-07-31 NOTE — Progress Notes (Addendum)
[facility] PROGRESS NOTE  [name] JXB:[id number] DOB: March 10, 1950 DOA: 07/23/2014 PCP: Elby Showers, MD  Assessment/Plan  64 y.o. 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  Pt developed a blister at multiple spots in her bilateral lower legs on Tuesday, following which she noted that her legs were very erythematous. Some of the blisters have spontaneously drained, she went to the wound care center where she had a small I&D done on the left lower medial thigh and she was sent to the ED for further evaluation.  Cellulitis and abscess of bilateral lower extremity due to severe lymphedema with progressive draining ulcers due to MDR S. aureus.  CT leg:  Soft tissue edema within both thighs consistent with cellulitis, no focal underlying fluid collection or evidence of osteomyelitis.  Leg [place]: no DVT.   -  Vancomycin d/c'd 7/30 by ID -  Continue Imipenem -  Left leg medial ulcer debrided by gen surg on 7/30 -  Continuing hydrotherapy, but may need further debridement of leg wound and wound vac placement.  Continuing to observe for now -  Appreciate general surgery assistance >> if planning procedure, please let me know so I can address coumadin -  Per patient request, will culture fluid from left leg bullae  Beta-lactamase positive bacteriodes fragilis bacteremia/sepsis - 7/24: blood cultures + BACTEROIDES FRAGILIS -  CT scan abd/pelvis to eval for gut source:  No obvious source of infection - 7/28:  blood cultures NGTD -  Appreciate ID assistance - Continue Imipenem, day 5 of 14   Type 2 diabetes mellitus Ha1c-7.0 (02/2014), CBG stable -  Continue lantus 28 -  Cont SSI -  Hold gliburide  PAF (paroxysmal atrial fibrillation), paced rhythm on tele, rate controlled.  Tele d/c'd  7/29 -  Coumadin dosed by pharmacy -  Continue amiodarone -  INR subtherapeutic but rising  Chronic CHF, systolic HF, with dilated Cardiomyopathy, nonischemic, compensated -  Echo (2014): LVEF 30-35%, Diffuse Hypokinesis, severe MR, dilated LA, RA, RA, LV, TR, pulmonary HTN -  Continue to hold Demadex due to elevated BUN:Cr and low BP -  Daily weights:  Wt stable  Hypotension likely due to dehydration -  Hold diuretics -  Continue reduced dose of BB (already has hold parameter for BP)  Hypothyroidism, acquired, autoimmune Continue levothyroxine   Hypocalcemia Continue with calcium, vitamin D supplementation. Monitor lites periodically   Acute on CKD (chronic kidney disease) stage 4, GFR 15-29 ml/min; h/o nephrectomy, creatinine rising slightly -  Minimize nephrotoxins and renally dose medications -  Continue to hold diuretics -  UA and FENa  Left renal exophytic mass.  Unable to perform CT/MRI with contrast due to CKD and [place] unlikely to be helpful given body habitus.   -  Repeat imaging in 3-6 months  Obesity hypoventilation syndrome, stable.  CPAP each bedtime   OBESITY, MORBID; cont encourage weigh loss, diet exercise   Leg pain, neuropathy, stable.  Continue neurontin and prn opioids  H/o IDA: no acute s/s of bleeding; h/o MGUS -  Iron studies wnl -  Defer to PCP/hematology  Diet:  Healthy heart/diabetic Access:  PIV IVF:  Off Proph:  Coumadin  Code Status: Full Family Communication: patient alone Disposition Plan:  Pending possible further debridement of left leg wound.     Consultants:  Infectious  disease  General surgery  Procedures:  CT lower extremity  Duplex lower extremity  Antibiotics: vanc 7/24 >> 7/28 Levofloxacin 7/24 >>> 7/28  Metronidazole 7/27>>> 7/28  Imipenem 7/28 >>>   HPI/Subjective:  Feeling a little better but frustrated that left leg is developing more blisters.  Denies fevers, chills, nausea, vomiting.    Objective: Filed  Vitals:   07/30/14 0611 07/30/14 1300 07/30/14 2200 07/31/14 0958  BP: 91/52 82/68 114/59   Pulse: 69 72 70 88  Temp: 97.5 F (36.4 C) 97.4 F (36.3 C) 97.4 F (36.3 C)   TempSrc: Oral Oral Oral   Resp: 18 16 16    Height:      Weight: 150.231 kg (331 lb 3.2 oz)     SpO2: 100% 99% 95%     Intake/Output Summary (Last 24 hours) at 07/31/14 1032 Last data filed at 07/31/14 0856  Gross per 24 hour  Intake    660 ml  Output    700 ml  Net    -40 ml   Filed Weights   07/28/14 0438 07/29/14 0441 07/30/14 0611  Weight: 150.141 kg (331 lb) 150.367 kg (331 lb 8 oz) 150.231 kg (331 lb 3.2 oz)    Exam:   General:  Obese CF, No acute distress  HEENT:  NCAT, MMM  Cardiovascular:  RRR, nl S1, S2 no mrg, 2+ pulses, warm extremities  Respiratory:  CTAB, no increased WOB  Abdomen:   NABS, soft, NT/ND  MSK:   Normal tone and bulk, bilateral lymphedema.  Dressing in place over ulcers today and did not remove to examine.  Erythema continuing to recede and become less pink on both lower extremities.  ~1cm bulla left leg oozing serous fluid.    Neuro:  Grossly intact but diffusely weak.    Data Reviewed: Basic Metabolic Panel:  Recent Labs Lab 07/27/14 0438 07/28/14 0448 07/29/14 0435 07/30/14 0500 07/31/14 0442  NA 135* 134* 134* 133* 135*  K 4.0 4.2 4.1 4.7 5.5*  CL 94* 92* 94* 93* 97  CO2 27 29 27 26 29   GLUCOSE 131* 174* 146* 138* 137*  BUN 87* 91* 92* 92* 93*  CREATININE 2.72* 2.80* 2.76* 2.97* 3.00*  CALCIUM 9.0 9.3 9.3 9.0 9.3   Liver Function Tests: No results found for this basename: AST, ALT, ALKPHOS, BILITOT, PROT, ALBUMIN,  in the last 168 hours No results found for this basename: LIPASE, AMYLASE,  in the last 168 hours No results found for this basename: AMMONIA,  in the last 168 hours CBC:  Recent Labs Lab 07/27/14 0438 07/28/14 0448 07/29/14 0435 07/30/14 0500 07/31/14 0442  WBC 4.8 6.2 6.4 6.6 6.7  HGB 8.5* 9.4* 9.4* 9.3* 9.5*  HCT 26.9* 29.7*  29.1* 28.9* 29.6*  MCV 96.8 97.4 95.1 96.0 96.4  PLT 171 221 240 260 257   Cardiac Enzymes: No results found for this basename: CKTOTAL, CKMB, CKMBINDEX, TROPONINI,  in the last 168 hours BNP (last 3 results) No results found for this basename: PROBNP,  in the last 8760 hours CBG:  Recent Labs Lab 07/30/14 0712 07/30/14 1230 07/30/14 1707 07/30/14 2206 07/31/14 0714  GLUCAP 145* 151* 187* 196* 152*    Recent Results (from the past 240 hour(s))  CULTURE, BLOOD (ROUTINE X 2)     Status: None   Collection Time    07/23/14  6:35 PM      Result Value Ref Range Status   Specimen Description BLOOD LEFT ANTECUBITAL   Final   Special  Requests BOTTLES DRAWN AEROBIC AND ANAEROBIC 5ML   Final   Culture  Setup Time     Final   Value: 07/23/2014 22:16     Performed at [facility]   Culture     Final   Value: BACTEROIDES FRAGILIS     Note: BETA LACTAMASE POSITIVE     Note: Gram Stain Report Called to,Read Back By and Verified With: Rulon Abide RN on 07/24/14 at 20:45 by Rise Mu     Performed at [facility]   Report Status 07/26/2014 FINAL   Final  CULTURE, BLOOD (ROUTINE X 2)     Status: None   Collection Time    07/23/14  6:38 PM      Result Value Ref Range Status   Specimen Description BLOOD RIGHT ANTECUBITAL   Final   Special Requests BOTTLES DRAWN AEROBIC AND ANAEROBIC 5ML   Final   Culture  Setup Time     Final   Value: 07/23/2014 22:16     Performed at [facility]   Culture     Final   Value: BACTEROIDES FRAGILIS     Note: CRITICAL RESULT CALLED TO, READ BACK BY AND VERIFIED WITH: JENNY BURNS RN @1015PM  VINCJ [id number] BETA LACTAMASE POSITIVE     Performed at [facility]   Report Status 07/26/2014 FINAL   Final  WOUND CULTURE     Status: None   Collection Time    07/24/14  3:44 PM      Result Value Ref Range Status   Specimen Description LEG   Final   Special Requests Normal   Final   Gram Stain     Final   Value: RARE SQUAMOUS  EPITHELIAL CELLS PRESENT     RARE WBC PRESENT, PREDOMINANTLY PMN     FEW GRAM POSITIVE COCCI IN PAIRS     FEW GRAM NEGATIVE RODS     Performed at [facility]   Culture     Final   Value: FEW STAPHYLOCOCCUS AUREUS     Note: RIFAMPIN AND GENTAMICIN SHOULD NOT BE USED AS SINGLE DRUGS FOR TREATMENT OF STAPH INFECTIONS.     Performed at [facility]   Report Status 07/29/2014 FINAL   Final   Organism ID, Bacteria STAPHYLOCOCCUS AUREUS   Final  CULTURE, BLOOD (ROUTINE X 2)     Status: None   Collection Time    07/27/14 11:15 AM      Result Value Ref Range Status   Specimen Description BLOOD LEFT ARM   Final   Special Requests BOTTLES DRAWN AEROBIC AND ANAEROBIC 10CC EACH   Final   Culture  Setup Time     Final   Value: 07/27/2014 13:54     Performed at [facility]   Culture     Final   Value:        BLOOD CULTURE RECEIVED NO GROWTH TO DATE CULTURE WILL BE HELD FOR 5 DAYS BEFORE ISSUING A FINAL NEGATIVE REPORT     Performed at [facility]   Report Status PENDING   Incomplete  CULTURE, BLOOD (ROUTINE X 2)     Status: None   Collection Time    07/27/14 11:20 AM      Result Value Ref Range Status   Specimen Description BLOOD RIGHT ARM   Final   Special Requests BOTTLES DRAWN AEROBIC AND ANAEROBIC 10CC EACH   Final   Culture  Setup Time     Final  Value: 07/27/2014 13:54     Performed at [facility]   Culture     Final   Value:        BLOOD CULTURE RECEIVED NO GROWTH TO DATE CULTURE WILL BE HELD FOR 5 DAYS BEFORE ISSUING A FINAL NEGATIVE REPORT     Performed at [facility]   Report Status PENDING   Incomplete     Studies: No results found.  Scheduled Meds: . amiodarone  400 mg Oral BID  . calcitRIOL  0.25 mcg Oral BID  . calcium carbonate  1 tablet Oral QID   And  . cholecalciferol  200 Units Oral QID  . collagenase   Topical Daily  . febuxostat  80 mg Oral Daily  . gabapentin  100 mg Oral BID  . imipenem-cilastatin  250 mg  Intravenous 4 times per day  . insulin aspart  0-15 Units Subcutaneous TID WC  . insulin glargine  28 Units Subcutaneous QHS  . iron polysaccharides  150 mg Oral BID  . levothyroxine  125 mcg Oral QAC breakfast  . linagliptin  5 mg Oral Daily  . metoprolol tartrate  12.5 mg Oral BID  . multivitamin with minerals  1 tablet Oral Daily  . mupirocin ointment   Topical BID  . nitroGLYCERIN  0.4 mg Transdermal QODAY  . saccharomyces boulardii  250 mg Oral BID  . sodium chloride  3 mL Intravenous Q12H  . Vitamin D (Ergocalciferol)  50,000 Units Oral Once per day on Mon Thu  . Warfarin - Pharmacist Dosing Inpatient   Does not apply q1800   Continuous Infusions:   Principal Problem:   Cellulitis of lower leg Active Problems:   OBESITY, MORBID   HYPERTENSION   PACEMAKER, PERMANENT   Obesity hypoventilation syndrome   Hypothyroidism, acquired, autoimmune   Solitary kidney, acquired   Cardiomyopathy, nonischemic- EF 30-35% June 2014   Type 2 diabetes mellitus   CKD (chronic kidney disease) stage 4, GFR 15-29 ml/min   PAF (paroxysmal atrial fibrillation)   Hypocalcemia   Cellulitis and abscess    Time spent: 30 min    Sherrie Marsan, [facility] Pager [phone]. If 7PM-7AM, please contact night-coverage at [email or web address], password [facility] 07/31/2014, 10:32 AM  LOS: 8 days

## 2014-08-01 LAB — CBC
HCT: 31.1 % — ABNORMAL LOW (ref 36.0–46.0)
HEMOGLOBIN: 9.8 g/dL — AB (ref 12.0–15.0)
MCH: 30.6 pg (ref 26.0–34.0)
MCHC: 31.5 g/dL (ref 30.0–36.0)
MCV: 97.2 fL (ref 78.0–100.0)
Platelets: 278 10*3/uL (ref 150–400)
RBC: 3.2 MIL/uL — AB (ref 3.87–5.11)
RDW: 19.7 % — ABNORMAL HIGH (ref 11.5–15.5)
WBC: 7 10*3/uL (ref 4.0–10.5)

## 2014-08-01 LAB — URINE MICROSCOPIC-ADD ON

## 2014-08-01 LAB — URINALYSIS, ROUTINE W REFLEX MICROSCOPIC
Glucose, UA: NEGATIVE mg/dL
Ketones, ur: NEGATIVE mg/dL
NITRITE: NEGATIVE
Protein, ur: NEGATIVE mg/dL
Specific Gravity, Urine: 1.016 (ref 1.005–1.030)
Urobilinogen, UA: 0.2 mg/dL (ref 0.0–1.0)
pH: 5 (ref 5.0–8.0)

## 2014-08-01 LAB — BASIC METABOLIC PANEL
Anion gap: 12 (ref 5–15)
BUN: 92 mg/dL — AB (ref 6–23)
CHLORIDE: 97 meq/L (ref 96–112)
CO2: 26 mEq/L (ref 19–32)
Calcium: 9.6 mg/dL (ref 8.4–10.5)
Creatinine, Ser: 2.77 mg/dL — ABNORMAL HIGH (ref 0.50–1.10)
GFR, EST AFRICAN AMERICAN: 20 mL/min — AB (ref >90)
GFR, EST NON AFRICAN AMERICAN: 17 mL/min — AB (ref >90)
Glucose, Bld: 126 mg/dL — ABNORMAL HIGH (ref 70–99)
POTASSIUM: 5.2 meq/L (ref 3.7–5.3)
SODIUM: 135 meq/L — AB (ref 137–147)

## 2014-08-01 LAB — GLUCOSE, CAPILLARY
GLUCOSE-CAPILLARY: 142 mg/dL — AB (ref 70–99)
Glucose-Capillary: 148 mg/dL — ABNORMAL HIGH (ref 70–99)
Glucose-Capillary: 152 mg/dL — ABNORMAL HIGH (ref 70–99)

## 2014-08-01 LAB — SODIUM, URINE, RANDOM

## 2014-08-01 LAB — CREATININE, URINE, RANDOM: CREATININE, URINE: 63.7 mg/dL

## 2014-08-01 LAB — PROTIME-INR
INR: 1.86 — ABNORMAL HIGH (ref 0.00–1.49)
PROTHROMBIN TIME: 21.4 s — AB (ref 11.6–15.2)

## 2014-08-01 MED ORDER — WARFARIN SODIUM 5 MG PO TABS
5.0000 mg | ORAL_TABLET | Freq: Once | ORAL | Status: AC
  Administered 2014-08-01: 5 mg via ORAL
  Filled 2014-08-01: qty 1

## 2014-08-01 MED ORDER — METRONIDAZOLE IN NACL 5-0.79 MG/ML-% IV SOLN
500.0000 mg | Freq: Three times a day (TID) | INTRAVENOUS | Status: DC
  Administered 2014-08-01 – 2014-08-03 (×5): 500 mg via INTRAVENOUS
  Filled 2014-08-01 (×6): qty 100

## 2014-08-01 MED ORDER — HYDROXYZINE HCL 10 MG PO TABS
10.0000 mg | ORAL_TABLET | Freq: Three times a day (TID) | ORAL | Status: DC
  Administered 2014-08-01: 10 mg via ORAL
  Filled 2014-08-01 (×3): qty 1

## 2014-08-01 MED ORDER — FAMOTIDINE 10 MG PO TABS
10.0000 mg | ORAL_TABLET | Freq: Two times a day (BID) | ORAL | Status: AC
  Administered 2014-08-01 – 2014-08-03 (×4): 10 mg via ORAL
  Filled 2014-08-01 (×4): qty 1

## 2014-08-01 NOTE — Progress Notes (Signed)
[facility] progress note. Chief complaint. New Rash. History of present illness. This 64 year old female in hospital with bilateral lower extremity cellulitis and blood cultures positive for bacteroids fragilis. Patient was being treated with imipenem day 6 of 14. She has multiple drug allergies listed including penicillin. Patient developed a new maculopapular rash over her back, chest, and upper arms. Came to see the patient at bedside to further evaluate the rash. This does look consistent with a drug rash most likely Primaxin despite having received multiple doses to date. Vital signs. Temperature 97.7, pulse 71, respiration 16, blood pressure 110/70. O2 sats 97%. General appearance. Obese elderly female who is alert and in no distress. Skin. Maculopapular rash and diffuse erythema seen on the back, upper chest, upper arms consistent with a drug rash. HEENT. No facial edema, no oral edema. Impression/plan. Problem #1. Likely drug rash to Primaxin. I have discussed the case with pharmacy and given the current presentation will discontinue Primaxin listed as an allergy. Will initiate IV therapy with Flagyl. I also started Pepcid and Atarax for H1/H2 blocker and over the next 24 hours. No indication of angioedema.

## 2014-08-01 NOTE — Progress Notes (Signed)
ANTIBIOTIC CONSULT NOTE - FOLLOW UP  Pharmacy Consult for Primaxin Indication: Cellulitis/Bactermia  Allergies  Allergen Reactions  . Ivp Dye [Iodinated Diagnostic Agents] Shortness Of Breath    Turn red, can't breathe  . Betadine [Povidone Iodine]   . Diphenhydramine Hcl Swelling    In hands and eyes  . Fish Allergy Nausea And Vomiting  . Iohexol      Desc: PT TURNS RED AND WHEEZING   . Penicillins Other (See Comments)    Resp arrest as child  . Povidone-Iodine Other (See Comments)    Wheezing, turn red, can't breath, and blisters  . Promethazine Hcl Other (See Comments)    Low Blood Pressure  . Tape     Blisters, can use paper tape for short periods  . Clindamycin Hives and Rash    wheezing  . Iodine Rash  . Morphine Sulfate Nausea And Vomiting and Rash    Patient Measurements: Height: 5\' 6"  (167.6 cm) Weight: 328 lb 14.8 oz (149.2 kg) IBW/kg (Calculated) : 59.3  Vital Signs: Temp: 97.7 F (36.5 C) (08/02 0447) Temp src: Oral (08/02 0447) BP: 97/61 mmHg (08/02 0447) Pulse Rate: 71 (08/02 0447) Intake/Output from previous day: 08/01 0701 - 08/02 0700 In: 1243 [P.O.:840; I.V.:3; IV Piggyback:400] Out: 1850 [Urine:1850] Intake/Output from this shift: Total I/O In: -  Out: 400 [Urine:400]  Labs:  Recent Labs  07/30/14 0500 07/31/14 0442 08/01/14 0438  WBC 6.6 6.7 7.0  HGB 9.3* 9.5* 9.8*  PLT 260 257 278  CREATININE 2.97* 3.00* 2.77*   Estimated Creatinine Clearance: 30.5 ml/min (by C-G formula based on Cr of 2.77).  22 ml/min/1.36m2 (normalized)  No results found for this basename: VANCOTROUGH, VANCOPEAK, VANCORANDOM, GENTTROUGH, GENTPEAK, GENTRANDOM, TOBRATROUGH, TOBRAPEAK, TOBRARND, AMIKACINPEAK, AMIKACINTROU, AMIKACIN,  in the last 72 hours   Microbiology: Recent Results (from the past 720 hour(s))  CULTURE, BLOOD (ROUTINE X 2)     Status: None   Collection Time    07/23/14  6:35 PM      Result Value Ref Range Status   Specimen Description  BLOOD LEFT ANTECUBITAL   Final   Special Requests BOTTLES DRAWN AEROBIC AND ANAEROBIC 5ML   Final   Culture  Setup Time     Final   Value: 07/23/2014 22:16     Performed at [facility]   Culture     Final   Value: BACTEROIDES FRAGILIS     Note: BETA LACTAMASE POSITIVE     Note: Gram Stain Report Called to,Read Back By and Verified With: Rulon Abide RN on 07/24/14 at 20:45 by Rise Mu     Performed at [facility]   Report Status 07/26/2014 FINAL   Final  CULTURE, BLOOD (ROUTINE X 2)     Status: None   Collection Time    07/23/14  6:38 PM      Result Value Ref Range Status   Specimen Description BLOOD RIGHT ANTECUBITAL   Final   Special Requests BOTTLES DRAWN AEROBIC AND ANAEROBIC 5ML   Final   Culture  Setup Time     Final   Value: 07/23/2014 22:16     Performed at [facility]   Culture     Final   Value: BACTEROIDES FRAGILIS     Note: CRITICAL RESULT CALLED TO, READ BACK BY AND VERIFIED WITH: [place] RN @1015PM  VINCJ [id number] BETA LACTAMASE POSITIVE     Performed at [facility]   Report Status 07/26/2014 FINAL   Final  WOUND CULTURE     Status: None   Collection Time    07/24/14  3:44 PM      Result Value Ref Range Status   Specimen Description LEG   Final   Special Requests Normal   Final   Gram Stain     Final   Value: RARE SQUAMOUS EPITHELIAL CELLS PRESENT     RARE WBC PRESENT, PREDOMINANTLY PMN     FEW GRAM POSITIVE COCCI IN PAIRS     FEW GRAM NEGATIVE RODS     Performed at [facility]   Culture     Final   Value: FEW STAPHYLOCOCCUS AUREUS     Note: RIFAMPIN AND GENTAMICIN SHOULD NOT BE USED AS SINGLE DRUGS FOR TREATMENT OF STAPH INFECTIONS.     Performed at [facility]   Report Status 07/29/2014 FINAL   Final   Organism ID, Bacteria STAPHYLOCOCCUS AUREUS   Final  CULTURE, BLOOD (ROUTINE X 2)     Status: None   Collection Time    07/27/14 11:15 AM      Result Value Ref Range Status   Specimen Description BLOOD  LEFT ARM   Final   Special Requests BOTTLES DRAWN AEROBIC AND ANAEROBIC 10CC EACH   Final   Culture  Setup Time     Final   Value: 07/27/2014 13:54     Performed at [facility]   Culture     Final   Value:        BLOOD CULTURE RECEIVED NO GROWTH TO DATE CULTURE WILL BE HELD FOR 5 DAYS BEFORE ISSUING A FINAL NEGATIVE REPORT     Performed at [facility]   Report Status PENDING   Incomplete  CULTURE, BLOOD (ROUTINE X 2)     Status: None   Collection Time    07/27/14 11:20 AM      Result Value Ref Range Status   Specimen Description BLOOD RIGHT ARM   Final   Special Requests BOTTLES DRAWN AEROBIC AND ANAEROBIC 10CC EACH   Final   Culture  Setup Time     Final   Value: 07/27/2014 13:54     Performed at [facility]   Culture     Final   Value:        BLOOD CULTURE RECEIVED NO GROWTH TO DATE CULTURE WILL BE HELD FOR 5 DAYS BEFORE ISSUING A FINAL NEGATIVE REPORT     Performed at [facility]   Report Status PENDING   Incomplete  WOUND CULTURE     Status: None   Collection Time    07/31/14  1:09 PM      Result Value Ref Range Status   Specimen Description WOUND LEFT LEG   Final   Special Requests Normal   Final   Gram Stain PENDING   Incomplete   Culture     Final   Value: NO GROWTH 1 DAY     Performed at [facility]   Report Status PENDING   Incomplete   Assessment: 64 y.o. 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 Patient was placed on empiric vanco and levaquin upon admission, now transitioned to Primaxin only.  MSSA on leg and beta-lactamase positive b. fragilis in blood.  2 wks carbapenem tx per ID, could switch to [facility] for outpatient dosing.  7/24 >> vancomycin  >>  7/31 7/24 >> levofloxacin >> 7/28 7/27 >> flagyl (MD)>> 7/28 7/28 >> primaxin >> D6/14 D10 Abx  Afebrile WBCs: WNL CKD4 (solitary kidney): SCr 2.8 (decreasing), CrCl 23N  Wound Cx 8/1: NGTD (new sample from bulla on leg) 7/28: blood: NGTD 7/24 blood: bacteroides fragilis (beta-lactamase positive) 2 of 2 7/24 urine: ordered (UA neg) 7/25 leg wound (after abx started): staph aureus - MSSA  Goal of Therapy:  Appropriate antibiotic dosing for renal function; eradication of infection  Plan:   Continue Primaxin 250mg  IV Q6H  Follow renal function and cultures  Reuel Boom, PharmD [phone] 08/01/2014 11:06 AM

## 2014-08-01 NOTE — Progress Notes (Addendum)
[facility] PROGRESS NOTE  [name] NWG:[id number] DOB: 1950-12-18 DOA: 07/23/2014 PCP: Elby Showers, MD  Assessment/Plan  64 y.o. 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  Pt developed a blister at multiple spots in her bilateral lower legs on Tuesday, following which she noted that her legs were very erythematous. Some of the blisters have spontaneously drained, she went to the wound care center where she had a small I&D done on the left lower medial thigh and she was sent to the ED for further evaluation.  Cellulitis and abscess of bilateral lower extremity due to severe lymphedema with progressive draining ulcers due to MDR MSSA S. aureus.  CT leg:  Soft tissue edema within both thighs consistent with cellulitis, no focal underlying fluid collection or evidence of osteomyelitis.  Leg [place]: no DVT.   -  Vancomycin d/c'd 7/30 by ID -  Continue Imipenem -  Left leg medial ulcer debrided by gen surg on 7/30 -  Continuing hydrotherapy, but may need further debridement of leg wound and wound vac placement.  Continuing to observe for now -  Appreciate general surgery assistance, will continue to observe through Monday to determine if she needs further debridement.  If not, plan to d/c home with either IV or IM ertapenem.   - Wound culture from left leg bullae pending  Beta-lactamase positive bacteriodes fragilis bacteremia/sepsis - 7/24: blood cultures + BACTEROIDES FRAGILIS -  CT scan abd/pelvis to eval for gut source:  No obvious source of infection - 7/28:  blood cultures NGTD -  Appreciate ID assistance - Continue Imipenem, day 6 of 14   Type 2 diabetes mellitus Ha1c-7.0 (02/2014), CBG stable -  Continue lantus 28 -  Cont SSI -  Hold gliburide  PAF (paroxysmal atrial  fibrillation), paced rhythm on tele, rate controlled.  Tele d/c'd 7/29 -  Coumadin dosed by pharmacy -  Continue amiodarone -  INR subtherapeutic but rising  Chronic CHF, systolic HF, with dilated Cardiomyopathy, nonischemic, compensated -  Echo (2014): LVEF 30-35%, Diffuse Hypokinesis, severe MR, dilated LA, RA, RA, LV, TR, pulmonary HTN -  Continue to hold Demadex due to elevated BUN:Cr and low BP -  Daily weights:  Wt stable  Hypotension likely due to dehydration -  Hold diuretics -  Continue reduced dose of BB (already has hold parameter for BP) -  Check cortisol level  Hypothyroidism, acquired, autoimmune Continue levothyroxine   Hypocalcemia Continue with calcium, vitamin D supplementation. Monitor lites periodically   Acute on CKD (chronic kidney disease) stage 4, GFR 15-29 ml/min; h/o nephrectomy, creatinine trending down  -  Minimize nephrotoxins and renally dose medications -  Continue to hold diuretics -  FENa pending  Left renal exophytic mass.  Unable to perform CT/MRI with contrast due to CKD and [place] unlikely to be helpful given body habitus.   -  Repeat imaging in 3-6 months  Obesity hypoventilation syndrome, stable.  CPAP each bedtime   OBESITY, MORBID; cont encourage weigh loss, diet exercise   Leg pain, neuropathy, stable.  Continue neurontin and prn opioids  H/o IDA: no acute s/s of bleeding; h/o MGUS -  Iron studies wnl -  Defer to PCP/hematology  Diet:  Healthy heart/diabetic Access:  PIV IVF:  Off Proph:  Coumadin  Code Status: Full Family Communication: patient alone Disposition  Plan:  Pending possible further debridement of left leg wound, continue observation through tomorrow.     Consultants:  Infectious disease  General surgery  Procedures:  CT lower extremity  Duplex lower extremity  Antibiotics: vanc 7/24 >> 7/28 Levofloxacin 7/24 >>> 7/28  Metronidazole 7/27>>> 7/28  Imipenem 7/28 >>>   HPI/Subjective:  Left leg ulcer  feeling softer.  Persistent pinkness and erythema of the bilateral legs, not improved since yesterday.    Denies fevers, chills, nausea, vomiting.  Having BMs   Objective: Filed Vitals:   07/31/14 0958 07/31/14 1409 07/31/14 2043 08/01/14 0447  BP:  92/54 97/49 97/61   Pulse: 88 70 66 71  Temp:  97.8 F (36.6 C) 97.8 F (36.6 C) 97.7 F (36.5 C)  TempSrc:  Oral Oral Oral  Resp:  19 18 18   Height:      Weight:    149.2 kg (328 lb 14.8 oz)  SpO2:  100% 97% 96%    Intake/Output Summary (Last 24 hours) at 08/01/14 0832 Last data filed at 08/01/14 0646  Gross per 24 hour  Intake   1243 ml  Output   1850 ml  Net   -607 ml   Filed Weights   07/29/14 0441 07/30/14 0611 08/01/14 0447  Weight: 150.367 kg (331 lb 8 oz) 150.231 kg (331 lb 3.2 oz) 149.2 kg (328 lb 14.8 oz)    Exam:   General:  Obese CF, No acute distress  HEENT:  NCAT, MMM  Cardiovascular:  RRR, nl S1, S2 no mrg, 2+ pulses, warm extremities  Respiratory:  CTAB, no increased WOB  Abdomen:   NABS, soft, NT/ND  MSK:   Normal tone and bulk, bilateral lymphedema.  Dressing removed from left leg ulcer.  Either cauterized vessel or small area of necrosis 12-o'clock.  Mostly healthy appearing tissue otherwise, but difficult to see in the tract at 5o'clock.  Left leg ulcer with yellow base.  Neuro:  Grossly intact but diffusely weak.    Data Reviewed: Basic Metabolic Panel:  Recent Labs Lab 07/28/14 0448 07/29/14 0435 07/30/14 0500 07/31/14 0442 08/01/14 0438  NA 134* 134* 133* 135* 135*  K 4.2 4.1 4.7 5.5* 5.2  CL 92* 94* 93* 97 97  CO2 29 27 26 29 26   GLUCOSE 174* 146* 138* 137* 126*  BUN 91* 92* 92* 93* 92*  CREATININE 2.80* 2.76* 2.97* 3.00* 2.77*  CALCIUM 9.3 9.3 9.0 9.3 9.6   Liver Function Tests: No results found for this basename: AST, ALT, ALKPHOS, BILITOT, PROT, ALBUMIN,  in the last 168 hours No results found for this basename: LIPASE, AMYLASE,  in the last 168 hours No results found for this  basename: AMMONIA,  in the last 168 hours CBC:  Recent Labs Lab 07/28/14 0448 07/29/14 0435 07/30/14 0500 07/31/14 0442 08/01/14 0438  WBC 6.2 6.4 6.6 6.7 7.0  HGB 9.4* 9.4* 9.3* 9.5* 9.8*  HCT 29.7* 29.1* 28.9* 29.6* 31.1*  MCV 97.4 95.1 96.0 96.4 97.2  PLT 221 240 260 257 278   Cardiac Enzymes: No results found for this basename: CKTOTAL, CKMB, CKMBINDEX, TROPONINI,  in the last 168 hours BNP (last 3 results) No results found for this basename: PROBNP,  in the last 8760 hours CBG:  Recent Labs Lab 07/31/14 0714 07/31/14 1148 07/31/14 1720 07/31/14 2133 08/01/14 0728  GLUCAP 152* 205* 161* 142* 142*    Recent Results (from the past 240 hour(s))  CULTURE, BLOOD (ROUTINE X 2)     Status: None  Collection Time    07/23/14  6:35 PM      Result Value Ref Range Status   Specimen Description BLOOD LEFT ANTECUBITAL   Final   Special Requests BOTTLES DRAWN AEROBIC AND ANAEROBIC 5ML   Final   Culture  Setup Time     Final   Value: 07/23/2014 22:16     Performed at [facility]   Culture     Final   Value: BACTEROIDES FRAGILIS     Note: BETA LACTAMASE POSITIVE     Note: Gram Stain Report Called to,Read Back By and Verified With: Rulon Abide RN on 07/24/14 at 20:45 by Rise Mu     Performed at [facility]   Report Status 07/26/2014 FINAL   Final  CULTURE, BLOOD (ROUTINE X 2)     Status: None   Collection Time    07/23/14  6:38 PM      Result Value Ref Range Status   Specimen Description BLOOD RIGHT ANTECUBITAL   Final   Special Requests BOTTLES DRAWN AEROBIC AND ANAEROBIC 5ML   Final   Culture  Setup Time     Final   Value: 07/23/2014 22:16     Performed at [facility]   Culture     Final   Value: BACTEROIDES FRAGILIS     Note: CRITICAL RESULT CALLED TO, READ BACK BY AND VERIFIED WITH: K[place]uth RN @1015PM  VINCJ [id number] BETA LACTAMASE POSITIVE     Performed at [facility]   Report Status 07/26/2014 FINAL   Final  WOUND CULTURE      Status: None   Collection Time    07/24/14  3:44 PM      Result Value Ref Range Status   Specimen Description LEG   Final   Special Requests Normal   Final   Gram Stain     Final   Value: RARE SQUAMOUS EPITHELIAL CELLS PRESENT     RARE WBC PRESENT, PREDOMINANTLY PMN     FEW GRAM POSITIVE COCCI IN PAIRS     FEW GRAM NEGATIVE RODS     Performed at [facility]   Culture     Final   Value: FEW STAPHYLOCOCCUS AUREUS     Note: RIFAMPIN AND GENTAMICIN SHOULD NOT BE USED AS SINGLE DRUGS FOR TREATMENT OF STAPH INFECTIONS.     Performed at [facility]   Report Status 07/29/2014 FINAL   Final   Organism ID, Bacteria STAPHYLOCOCCUS AUREUS   Final  CULTURE, BLOOD (ROUTINE X 2)     Status: None   Collection Time    07/27/14 11:15 AM      Result Value Ref Range Status   Specimen Description BLOOD LEFT ARM   Final   Special Requests BOTTLES DRAWN AEROBIC AND ANAEROBIC 10CC EACH   Final   Culture  Setup Time     Final   Value: 07/27/2014 13:54     Performed at [facility]   Culture     Final   Value:        BLOOD CULTURE RECEIVED NO GROWTH TO DATE CULTURE WILL BE HELD FOR 5 DAYS BEFORE ISSUING A FINAL NEGATIVE REPORT     Performed at [facility]   Report Status PENDING   Incomplete  CULTURE, BLOOD (ROUTINE X 2)     Status: None   Collection Time    07/27/14 11:20 AM      Result Value Ref Range Status   Specimen  Description BLOOD RIGHT ARM   Final   Special Requests BOTTLES DRAWN AEROBIC AND ANAEROBIC 10CC EACH   Final   Culture  Setup Time     Final   Value: 07/27/2014 13:54     Performed at [facility]   Culture     Final   Value:        BLOOD CULTURE RECEIVED NO GROWTH TO DATE CULTURE WILL BE HELD FOR 5 DAYS BEFORE ISSUING A FINAL NEGATIVE REPORT     Performed at [facility]   Report Status PENDING   Incomplete  WOUND CULTURE     Status: None   Collection Time    07/31/14  1:09 PM      Result Value Ref Range Status   Specimen  Description WOUND LEFT LEG   Final   Special Requests Normal   Final   Gram Stain PENDING   Incomplete   Culture     Final   Value: NO GROWTH 1 DAY     Performed at [facility]   Report Status PENDING   Incomplete     Studies: No results found.  Scheduled Meds: . amiodarone  400 mg Oral BID  . calcitRIOL  0.25 mcg Oral BID  . calcium carbonate  1 tablet Oral QID   And  . cholecalciferol  200 Units Oral QID  . collagenase   Topical Daily  . febuxostat  80 mg Oral Daily  . gabapentin  100 mg Oral BID  . imipenem-cilastatin  250 mg Intravenous 4 times per day  . insulin aspart  0-15 Units Subcutaneous TID WC  . insulin glargine  28 Units Subcutaneous QHS  . iron polysaccharides  150 mg Oral BID  . levothyroxine  125 mcg Oral QAC breakfast  . linagliptin  5 mg Oral Daily  . metoprolol tartrate  12.5 mg Oral BID  . multivitamin with minerals  1 tablet Oral Daily  . mupirocin ointment   Topical BID  . nitroGLYCERIN  0.4 mg Transdermal QODAY  . saccharomyces boulardii  250 mg Oral BID  . sodium chloride  3 mL Intravenous Q12H  . Vitamin D (Ergocalciferol)  50,000 Units Oral Once per day on Mon Thu  . Warfarin - Pharmacist Dosing Inpatient   Does not apply q1800   Continuous Infusions:   Principal Problem:   Cellulitis of lower leg Active Problems:   OBESITY, MORBID   HYPERTENSION   PACEMAKER, PERMANENT   Obesity hypoventilation syndrome   Hypothyroidism, acquired, autoimmune   Solitary kidney, acquired   Cardiomyopathy, nonischemic- EF 30-35% June 2014   Type 2 diabetes mellitus   CKD (chronic kidney disease) stage 4, GFR 15-29 ml/min   PAF (paroxysmal atrial fibrillation)   Hypocalcemia   Cellulitis and abscess    Time spent: 30 min    Paco Cislo, [facility] Pager [phone]. If 7PM-7AM, please contact night-coverage at [email or web address], password [facility] 08/01/2014, 8:32 AM  LOS: 9 days

## 2014-08-01 NOTE — Progress Notes (Signed)
ANTICOAGULATION CONSULT NOTE - Follow Up  Pharmacy Consult for warfarin Indication: afib   Allergies  Allergen Reactions  . Ivp Dye [Iodinated Diagnostic Agents] Shortness Of Breath    Turn red, can't breathe  . Betadine [Povidone Iodine]   . Diphenhydramine Hcl Swelling    In hands and eyes  . Fish Allergy Nausea And Vomiting  . Iohexol      Desc: PT TURNS RED AND WHEEZING   . Penicillins Other (See Comments)    Resp arrest as child  . Povidone-Iodine Other (See Comments)    Wheezing, turn red, can't breath, and blisters  . Promethazine Hcl Other (See Comments)    Low Blood Pressure  . Tape     Blisters, can use paper tape for short periods  . Clindamycin Hives and Rash    wheezing  . Iodine Rash  . Morphine Sulfate Nausea And Vomiting and Rash    Patient Measurements: Height: 5\' 6"  (167.6 cm) Weight: 328 lb 14.8 oz (149.2 kg) IBW/kg (Calculated) : 59.3 Vital Signs: Temp: 97.7 F (36.5 C) (08/02 0447) Temp src: Oral (08/02 0447) BP: 97/61 mmHg (08/02 0447) Pulse Rate: 71 (08/02 0447)  Labs:  Recent Labs  07/30/14 0500 07/31/14 0442 08/01/14 0438  HGB 9.3* 9.5* 9.8*  HCT Cynthia.9* 29.6* 31.1*  PLT 260 257 278  LABPROT 19.3* 20.2* 21.4*  INR 1.63* 1.72* 1.86*  CREATININE 2.97* 3.00* 2.77*    Estimated Creatinine Clearance: 30.5 ml/min (by C-G formula based on Cr of 2.77).   Assessment: [name] on chronic warfarin/amiodarone therapy for afib. INR supratherapeutic at admission.  Home dose may need to be decreased slightly due to recent increase in amiodarone dose (see below).  I/D of LLE cellulitis Friday, may need additional in coming days.     Home dose 2.5 mg MWF; 5mg  all other days  Missed 5 mg dose on 5/Cynthia  INR today SUBtherapeutic  CBC: stable. No bleeding reported per nursing.  BLE dopplers neg for DVT  Diet: heart healthy, eating 50-100% of meals  Drug interactions:  Chronic amiodarone - dosage increased in May outpatient, possible  contribution to elevated INR as effects are typically seen weeks/months into therapy.   Goal of Therapy:  INR 2-3   Plan:   Warfarin 5 mg x1 today.  Daily INR  Reuel Boom, PharmD [phone] 08/01/2014 10:27 AM

## 2014-08-02 LAB — BASIC METABOLIC PANEL
Anion gap: 12 (ref 5–15)
BUN: 85 mg/dL — AB (ref 6–23)
CALCIUM: 9.5 mg/dL (ref 8.4–10.5)
CO2: 24 mEq/L (ref 19–32)
CREATININE: 2.57 mg/dL — AB (ref 0.50–1.10)
Chloride: 98 mEq/L (ref 96–112)
GFR calc Af Amer: 21 mL/min — ABNORMAL LOW (ref >90)
GFR, EST NON AFRICAN AMERICAN: 18 mL/min — AB (ref >90)
GLUCOSE: 168 mg/dL — AB (ref 70–99)
Potassium: 6.1 mEq/L — ABNORMAL HIGH (ref 3.7–5.3)
Sodium: 134 mEq/L — ABNORMAL LOW (ref 137–147)

## 2014-08-02 LAB — GLUCOSE, CAPILLARY
GLUCOSE-CAPILLARY: 144 mg/dL — AB (ref 70–99)
GLUCOSE-CAPILLARY: 143 mg/dL — AB (ref 70–99)
GLUCOSE-CAPILLARY: 145 mg/dL — AB (ref 70–99)
Glucose-Capillary: 180 mg/dL — ABNORMAL HIGH (ref 70–99)
Glucose-Capillary: 162 mg/dL — ABNORMAL HIGH (ref 70–99)

## 2014-08-02 LAB — PROTIME-INR
INR: 2.06 — ABNORMAL HIGH (ref 0.00–1.49)
Prothrombin Time: 23.2 seconds — ABNORMAL HIGH (ref 11.6–15.2)

## 2014-08-02 LAB — CULTURE, BLOOD (ROUTINE X 2)
Culture: NO GROWTH
Culture: NO GROWTH

## 2014-08-02 MED ORDER — HYDROXYZINE HCL 10 MG PO TABS
10.0000 mg | ORAL_TABLET | ORAL | Status: DC | PRN
  Administered 2014-08-02 (×2): 10 mg via ORAL
  Filled 2014-08-02 (×2): qty 1

## 2014-08-02 MED ORDER — LINEZOLID 600 MG PO TABS
600.0000 mg | ORAL_TABLET | Freq: Two times a day (BID) | ORAL | Status: DC
  Administered 2014-08-02 – 2014-08-03 (×3): 600 mg via ORAL
  Filled 2014-08-02 (×4): qty 1

## 2014-08-02 MED ORDER — TORSEMIDE 10 MG PO TABS
50.0000 mg | ORAL_TABLET | Freq: Every day | ORAL | Status: DC
  Administered 2014-08-02: 50 mg via ORAL
  Filled 2014-08-02 (×2): qty 1

## 2014-08-02 MED ORDER — HYDROCORTISONE 1 % EX CREA
TOPICAL_CREAM | Freq: Two times a day (BID) | CUTANEOUS | Status: DC
  Administered 2014-08-02 – 2014-08-03 (×2): via TOPICAL
  Filled 2014-08-02 (×2): qty 28

## 2014-08-02 MED ORDER — METOLAZONE 2.5 MG PO TABS
2.5000 mg | ORAL_TABLET | Freq: Once | ORAL | Status: AC
  Administered 2014-08-02: 2.5 mg via ORAL
  Filled 2014-08-02 (×2): qty 1

## 2014-08-02 MED ORDER — TORSEMIDE 100 MG PO TABS
100.0000 mg | ORAL_TABLET | Freq: Every day | ORAL | Status: DC
  Administered 2014-08-02 – 2014-08-03 (×2): 100 mg via ORAL
  Filled 2014-08-02 (×2): qty 1

## 2014-08-02 MED ORDER — SODIUM CHLORIDE 0.9 % IV SOLN
1.0000 g | Freq: Once | INTRAVENOUS | Status: AC
  Administered 2014-08-02: 1 g via INTRAVENOUS
  Filled 2014-08-02: qty 10

## 2014-08-02 MED ORDER — WARFARIN SODIUM 2.5 MG PO TABS
2.5000 mg | ORAL_TABLET | Freq: Once | ORAL | Status: AC
  Administered 2014-08-02: 2.5 mg via ORAL
  Filled 2014-08-02: qty 1

## 2014-08-02 MED ORDER — HYDROXYZINE HCL 25 MG PO TABS
25.0000 mg | ORAL_TABLET | ORAL | Status: DC | PRN
  Administered 2014-08-02 – 2014-08-03 (×3): 25 mg via ORAL
  Filled 2014-08-02 (×2): qty 1

## 2014-08-02 NOTE — Progress Notes (Signed)
Patient ID: [name], female   DOB: [birth year], 64 y.o.   MRN: [id number]  Subjective: No specific complaints.   Objective:  Vital signs:  Filed Vitals:   08/01/14 1320 08/01/14 1604 08/01/14 2129 08/02/14 0613  BP: 80/41 106/51 110/70 113/54  Pulse: 70 70 71 69  Temp: 97.6 F (36.4 C)  97.7 F (36.5 C) 97.4 F (36.3 C)  TempSrc: Oral  Oral Oral  Resp: _0 Height:      Weight:    347 lb 1.6 oz (157.444 kg)  SpO2: 96%  97% 98%    Last BM Date: 08/02/14  Intake/Output   Yesterday:  08/02 0701 - 08/03 0700 In: 1163 [P.O.:960; I.V.:3; IV Piggyback:200] Out: 1050 [Urine:1050] This shift:  Total I/O In: 240 [P.O.:240] Out: 700 [Urine:700]  Physical Exam: General: Pt awake/alert/oriented x4 in no acute distress Skin: rash.  LLE edema, there are several small open wounds/ that are superficial and appear like punctured bullae.  LLE, medial region wound- less tracking about 6cm at 4 o'clock, small amount of necrotic tissue and fibrinous exudate, no areas of fluctuance, there is surrounding erythema or irritation from the tegaderm.  Wound was repacked.  RLE wound-yellow 100%, add hydro to this.    Problem List:   Principal Problem:   Cellulitis of lower leg Active Problems:   OBESITY, MORBID   HYPERTENSION   PACEMAKER, PERMANENT   Obesity hypoventilation syndrome   Hypothyroidism, acquired, autoimmune   Solitary kidney, acquired   Cardiomyopathy, nonischemic- EF 30-35% June 2014   Type 2 diabetes mellitus   CKD (chronic kidney disease) stage 4, GFR 15-29 ml/min   PAF (paroxysmal atrial fibrillation)   Hypocalcemia   Cellulitis and abscess    Results:   Labs: Results for orders placed during the hospital encounter of 07/23/14 (from the past 48 hour(s))  GLUCOSE, CAPILLARY     Status: Abnormal   Collection Time    07/31/14 11:48 AM      Result Value Ref Range   Glucose-Capillary 205 (*) 70 - 99 mg/dL  WOUND CULTURE     Status: None   Collection  Time    07/31/14  1:09 PM      Result Value Ref Range   Specimen Description WOUND LEFT LEG     Special Requests Normal     Gram Stain       Value: NO WBC SEEN     NO SQUAMOUS EPITHELIAL CELLS SEEN     NO ORGANISMS SEEN     Performed at [facility]   Culture       Value: NO GROWTH 2 DAYS     Performed at [facility]   Report Status PENDING    GLUCOSE, CAPILLARY     Status: Abnormal   Collection Time    07/31/14  5:20 PM      Result Value Ref Range   Glucose-Capillary 161 (*) 70 - 99 mg/dL  GLUCOSE, CAPILLARY     Status: Abnormal   Collection Time    07/31/14  9:33 PM      Result Value Ref Range   Glucose-Capillary 142 (*) 70 - 99 mg/dL  URINALYSIS, ROUTINE W REFLEX MICROSCOPIC     Status: Abnormal   Collection Time    08/01/14  2:05 AM      Result Value Ref Range   Color, Urine YELLOW  YELLOW   APPearance CLOUDY (*) CLEAR   Specific Gravity, Urine 1.016  1.005 -  1.030   pH 5.0  5.0 - 8.0   Glucose, UA NEGATIVE  NEGATIVE mg/dL   Hgb urine dipstick TRACE (*) NEGATIVE   Bilirubin Urine SMALL (*) NEGATIVE   Ketones, ur NEGATIVE  NEGATIVE mg/dL   Protein, ur NEGATIVE  NEGATIVE mg/dL   Urobilinogen, UA 0.2  0.0 - 1.0 mg/dL   Nitrite NEGATIVE  NEGATIVE   Leukocytes, UA LARGE (*) NEGATIVE  SODIUM, URINE, RANDOM     Status: None   Collection Time    08/01/14  2:05 AM      Result Value Ref Range   Sodium, Ur <15     Comment: Result repeated and verified.     Performed at [place], URINE, RANDOM     Status: None   Collection Time    08/01/14  2:05 AM      Result Value Ref Range   Creatinine, Urine 63.7     Comment: Performed at [place] [facility]     Status: Abnormal   Collection Time    08/01/14  2:05 AM      Result Value Ref Range   Squamous Epithelial / LPF MANY (*) RARE   WBC, UA 7-10  <3 WBC/hpf   RBC / HPF 7-10  <3 RBC/hpf   Casts GRANULAR CAST (*) NEGATIVE   Urine-Other MANY YEAST     PROTIME-INR     Status: Abnormal   Collection Time    08/01/14  4:38 AM      Result Value Ref Range   Prothrombin Time 21.4 (*) 11.6 - 15.2 seconds   INR 1.86 (*) 0.00 - 1.61  BASIC METABOLIC PANEL     Status: Abnormal   Collection Time    08/01/14  4:38 AM      Result Value Ref Range   Sodium 135 (*) 137 - 147 mEq/L   Potassium 5.2  3.7 - 5.3 mEq/L   Chloride 97  96 - 112 mEq/L   CO2 26  19 - 32 mEq/L   Glucose, Bld 126 (*) 70 - 99 mg/dL   BUN 92 (*) 6 - 23 mg/dL   Creatinine, Ser 2.77 (*) 0.50 - 1.10 mg/dL   Calcium 9.6  8.4 - 10.5 mg/dL   GFR calc non Af Amer 17 (*) >90 mL/min   GFR calc Af Amer 20 (*) >90 mL/min   Comment: (NOTE)     The eGFR has been calculated using the CKD EPI equation.     This calculation has not been validated in all clinical situations.     eGFR's persistently <90 mL/min signify possible Chronic Kidney     Disease.   Anion gap 12  5 - 15  CBC     Status: Abnormal   Collection Time    08/01/14  4:38 AM      Result Value Ref Range   WBC 7.0  4.0 - 10.5 K/uL   RBC 3.20 (*) 3.87 - 5.11 MIL/uL   Hemoglobin 9.8 (*) 12.0 - 15.0 g/dL   HCT 31.1 (*) 36.0 - 46.0 %   MCV 97.2  78.0 - 100.0 fL   MCH 30.6  26.0 - 34.0 pg   MCHC 31.5  30.0 - 36.0 g/dL   RDW 19.7 (*) 11.5 - 15.5 %   Platelets 278  150 - 400 K/uL  GLUCOSE, CAPILLARY     Status: Abnormal   Collection Time    08/01/14  7:28 AM  Result Value Ref Range   Glucose-Capillary 142 (*) 70 - 99 mg/dL  GLUCOSE, CAPILLARY     Status: Abnormal   Collection Time    08/01/14 11:45 AM      Result Value Ref Range   Glucose-Capillary 148 (*) 70 - 99 mg/dL  GLUCOSE, CAPILLARY     Status: Abnormal   Collection Time    08/01/14  4:54 PM      Result Value Ref Range   Glucose-Capillary 152 (*) 70 - 99 mg/dL  PROTIME-INR     Status: Abnormal   Collection Time    08/02/14  4:21 AM      Result Value Ref Range   Prothrombin Time 23.2 (*) 11.6 - 15.2 seconds   INR 2.06 (*) 0.00 - 7.82  BASIC METABOLIC  PANEL     Status: Abnormal   Collection Time    08/02/14  4:21 AM      Result Value Ref Range   Sodium 134 (*) 137 - 147 mEq/L   Potassium 6.1 (*) 3.7 - 5.3 mEq/L   Chloride 98  96 - 112 mEq/L   CO2 24  19 - 32 mEq/L   Glucose, Bld 168 (*) 70 - 99 mg/dL   BUN 85 (*) 6 - 23 mg/dL   Creatinine, Ser 2.57 (*) 0.50 - 1.10 mg/dL   Calcium 9.5  8.4 - 10.5 mg/dL   GFR calc non Af Amer 18 (*) >90 mL/min   GFR calc Af Amer 21 (*) >90 mL/min   Comment: (NOTE)     The eGFR has been calculated using the CKD EPI equation.     This calculation has not been validated in all clinical situations.     eGFR's persistently <90 mL/min signify possible Chronic Kidney     Disease.   Anion gap 12  5 - 15    Imaging / Studies: No results found.  Scheduled Meds: . amiodarone  400 mg Oral BID  . calcitRIOL  0.25 mcg Oral BID  . calcium carbonate  1 tablet Oral QID   And  . cholecalciferol  200 Units Oral QID  . collagenase   Topical Daily  . famotidine  10 mg Oral BID  . febuxostat  80 mg Oral Daily  . gabapentin  100 mg Oral BID  . insulin aspart  0-15 Units Subcutaneous TID WC  . insulin glargine  28 Units Subcutaneous QHS  . iron polysaccharides  150 mg Oral BID  . levothyroxine  125 mcg Oral QAC breakfast  . linagliptin  5 mg Oral Daily  . linezolid  600 mg Oral Q12H  . metolazone  2.5 mg Oral Once  . metronidazole  500 mg Intravenous Q8H  . multivitamin with minerals  1 tablet Oral Daily  . mupirocin ointment   Topical BID  . nitroGLYCERIN  0.4 mg Transdermal QODAY  . saccharomyces boulardii  250 mg Oral BID  . sodium chloride  3 mL Intravenous Q12H  . torsemide  100 mg Oral Daily  . torsemide  50 mg Oral Daily  . Vitamin D (Ergocalciferol)  50,000 Units Oral Once per day on Mon Thu  . warfarin  2.5 mg Oral ONCE-1800  . Warfarin - Pharmacist Dosing Inpatient   Does not apply q1800   Continuous Infusions:  PRN Meds:.sodium chloride, acetaminophen, acetaminophen, albuterol, ALPRAZolam,  guaiFENesin-dextromethorphan, hydrOXYzine, nitroGLYCERIN, ondansetron (ZOFRAN) IV, ondansetron, oxyCODONE-acetaminophen, sodium chloride   Antibiotics: Anti-infectives   Start     Dose/Rate Route Frequency Ordered Stop  08/02/14 1000  linezolid (ZYVOX) tablet 600 mg     600 mg Oral Every 12 hours 08/02/14 0952     08/01/14 2200  metroNIDAZOLE (FLAGYL) IVPB 500 mg     500 mg 100 mL/hr over 60 Minutes Intravenous Every 8 hours 08/01/14 2158     07/29/14 1000  vancomycin (VANCOCIN) 1,500 mg in sodium chloride 0.9 % 500 mL IVPB     1,500 mg 250 mL/hr over 120 Minutes Intravenous  Once 07/29/14 0541 07/29/14 1238   07/27/14 1200  imipenem-cilastatin (PRIMAXIN) 250 mg in sodium chloride 0.9 % 100 mL IVPB  Status:  Discontinued     250 mg 200 mL/hr over 30 Minutes Intravenous 4 times per day 07/27/14 1103 08/01/14 2159   07/27/14 0200  metroNIDAZOLE (FLAGYL) IVPB 500 mg  Status:  Discontinued     500 mg 100 mL/hr over 60 Minutes Intravenous Every 6 hours 07/26/14 1859 07/27/14 1247   07/26/14 1900  metroNIDAZOLE (FLAGYL) IVPB 500 mg     500 mg 100 mL/hr over 60 Minutes Intravenous STAT 07/26/14 1858 07/26/14 2013   07/26/14 1000  vancomycin (VANCOCIN) 2,000 mg in sodium chloride 0.9 % 500 mL IVPB  Status:  Discontinued     2,000 mg 250 mL/hr over 120 Minutes Intravenous Every 48 hours 07/26/14 0445 07/27/14 1040   07/24/14 1000  vancomycin (VANCOCIN) 2,000 mg in sodium chloride 0.9 % 500 mL IVPB  Status:  Discontinued     2,000 mg 250 mL/hr over 120 Minutes Intravenous Every 24 hours 07/23/14 1914 07/25/14 0914   07/23/14 2200  levofloxacin (LEVAQUIN) IVPB 750 mg  Status:  Discontinued     750 mg 100 mL/hr over 90 Minutes Intravenous Every 48 hours 07/23/14 2059 07/27/14 1040   07/23/14 1930  vancomycin (VANCOCIN) 2,000 mg in sodium chloride 0.9 % 500 mL IVPB     2,000 mg 250 mL/hr over 120 Minutes Intravenous NOW 07/23/14 1842 07/23/14 2111      Assessment/Plan  CHF  PAF on  coumadin  CKD  Diabetes mellitus  Obesity  LE lymphedema   Bacteroides fragilis bacteremia  LLE wounds/cellulitis  -s/p bedside I&d -continue with hydrotherapy, start on the wound on the RLE, proximal aspect.  LLE wound with fibrinous exudate, now tracks about 6cm at 4 o'clock. -no need for surgical debridement and suspicion for  abscesses  -santyl  -BID wet to dry dressing change to left medial leg  -antibiotics per primary service/ID  -will follow  Erby Pian, [facility] Pager [phone] Office [phone]  08/02/2014 10:19 AM

## 2014-08-02 NOTE — Progress Notes (Signed)
Physical Therapy Treatment Patient Details Name: [name] MRN: [id number] DOB: April 25, 1950 Today's Date: 08-22-2014    History of Present Illness 64 y.o. 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     PT Comments    Pt able to increased distance slightly today however continues to present with limited mobility.  Pt now considering ST-SNF prior to home, however states only at [facility] if she does go to a SNF.  Follow Up Recommendations  Supervision for mobility/OOB;SNF     Equipment Recommendations  None recommended by PT    Recommendations for Other Services       Precautions / Restrictions Precautions Precautions: Fall    Mobility  Bed Mobility Overal bed mobility: Needs Assistance Bed Mobility: Supine to Sit;Sit to Supine     Supine to sit: Supervision     General bed mobility comments: standby in case of need for LE assist  Transfers Overall transfer level: Needs assistance Equipment used: Rolling walker (2 wheeled) Transfers: Sit to/from Stand Sit to Stand: +2 safety/equipment;Min assist         General transfer comment: min/guard from elevated bed however min assist from recliner  Ambulation/Gait Ambulation/Gait assistance: Min guard;+2 safety/equipment Ambulation Distance (Feet): 25 Feet Assistive device: Rolling walker (2 wheeled) Gait Pattern/deviations: Step-through pattern;Wide base of support;Decreased stride length;Trunk flexed Gait velocity: decr   General Gait Details: fatigues quickly, LEs buckling near end of ambulation and pt cued to sit in recliner for safety, recliner following   Stairs            Wheelchair Mobility    Modified Rankin (Stroke Patients Only)       Balance                                     Cognition Arousal/Alertness: Awake/alert Behavior During Therapy: WFL for tasks assessed/performed Overall Cognitive Status: Within Functional Limits for tasks assessed                      Exercises      General Comments        Pertinent Vitals/Pain bil LE pain (hydrotherapy previous to mobility), premedicated, repositioned    Home Living                      Prior Function            PT Goals (current goals can now be found in the care plan section) Acute Rehab PT Goals PT Goal Formulation: With patient Time For Goal Achievement: 08/09/14 Potential to Achieve Goals: Good Progress towards PT goals: Progressing toward goals    Frequency  Min 3X/week    PT Plan Current plan remains appropriate    Co-evaluation             End of Session Equipment Utilized During Treatment: Gait belt Activity Tolerance: Patient limited by fatigue Patient left: in chair;with call bell/phone within reach     Time: 1201-1214 PT Time Calculation (min): 13 min  Charges:  $Gait Training: 8-22 mins                    G Codes:      Earnestene Angello,KATHrine E 22-Aug-2014,  1:05 PM Carmelia Bake, PT, DPT 08/02/2014 Pager: [phone]

## 2014-08-02 NOTE — Progress Notes (Addendum)
[facility] PROGRESS NOTE  [name] ZOX:[id number] DOB: 1950-07-03 DOA: 07/23/2014 PCP: Elby Showers, MD  Brief Summary  64 y.o. 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  Pt developed a blister at multiple spots in her bilateral lower legs on Tuesday, following which she noted that her legs were very erythematous. Some of the blisters have spontaneously drained, she went to the wound care center where she had a small I&D done on the left lower medial thigh and she was sent to the ED for further evaluation.  Assessment/Plan  Cellulitis and abscess of bilateral lower extremity due to severe lymphedema with progressive draining ulcers due to MDR MSSA S. aureus.  CT leg:  Soft tissue edema within both thighs consistent with cellulitis, no focal underlying fluid collection or evidence of osteomyelitis.  Leg [place]: no DVT.  Resuming diuresis despite hypotension and recent AKI to help with healing process.   -  Vancomycin d/c'd 7/30 by ID -  PCN allergic -  Start linezolid per Dr. Tommy Medal recommendations to complete a 14-day course -  Left leg medial ulcer debrided by gen surg on 7/30 -  Continue hydrotherapy -  Appreciate general surgery assistance -  Wound culture from left leg bullae NGTD  Drug allergy, likely to primaxin -  Consider Joeleen Wortley prednisone taper if not improving quickly after cessation of abx  Beta-lactamase positive bacteriodes fragilis bacteremia/sepsis - 7/24: blood cultures + BACTEROIDES FRAGILIS -  CT scan abd/pelvis to eval for gut source:  No obvious source of infection - 7/28:  blood cultures NGTD -  Appreciate ID assistance -  Imipenem dc'd yesterday due to drug allergy and transitioned to flagyl -  Flagyl, day 7 of 14 of adequate abx   Type 2 diabetes  mellitus Ha1c-7.0 (02/2014), CBG stable -  Continue lantus 28 -  Cont SSI -  Hold gliburide  PAF (paroxysmal atrial fibrillation), paced rhythm on tele, rate controlled.  Tele d/c'd 7/29 -  Coumadin dosed by pharmacy, INR therapeutic -  Continue amiodarone  Chronic CHF, systolic HF, with dilated Cardiomyopathy, nonischemic, compensated, wt up 7kg  -  Echo (2014): LVEF 30-35%, Diffuse Hypokinesis, severe MR, dilated LA, RA, RA, LV, TR, pulmonary HTN -  Resume Demadex -  If BP stable this morning, transition to IV lasix and start metolazone  Hypotension, recurrent and asymptomatic.  Wonder if BP accurate?  -  Resume diuretics as above -  Resume BB if BP remains stable during diuresis -  F/u cortisol level  Hypothyroidism, acquired, autoimmune Continue levothyroxine   Acute on CKD (chronic kidney disease) stage 4, GFR 15-29 ml/min; h/o nephrectomy, creatinine trending down  -  Minimize nephrotoxins and renally dose medications -  Resume diuretics -  FENa prerenal  Left renal exophytic mass.  Unable to perform CT/MRI with contrast due to CKD and [place] unlikely to be helpful given body habitus.   -  Repeat imaging in 3-6 months  Obesity hypoventilation syndrome, stable.  CPAP each bedtime   OBESITY, MORBID; cont encourage weigh loss, diet exercise   Leg pain, neuropathy, stable.  Continue neurontin and prn opioids  H/o IDA: no acute s/s of bleeding; h/o MGUS -  Iron studies wnl -  Defer to PCP/hematology  Hyperkalemia, not on potassium supplements but may be due to CKD stage IV  and withheld diuretics vs. Hemolysis -  Resume diuretics -  ECG once -  Calcium gluconate 1gm IV once  Hypocalcemia Continue with calcium, vitamin D supplementation. Monitor lites periodically   Diet:  Healthy heart/diabetic Access:  PIV IVF:  Off Proph:  Coumadin  Code Status: Full Family Communication: patient alone Disposition Plan:  Plan to discharge home with hydrotherapy dressing changes and  oral antibiotics if BP remains stable.  Need to diurese some prior to discharge.  PT to reevaluate.     Consultants:  Infectious disease  General surgery  Procedures:  CT lower extremity  Duplex lower extremity  Antibiotics: vanc 7/24 >> 7/28 Levofloxacin 7/24 >>> 7/28  Metronidazole 7/27>>> 7/28  Imipenem 7/28 >>> 8/2 Flagyl 8/2 >> Linezolid 8/2 >>   HPI/Subjective:  Very itchy and rashed all over.  Had some wheezing last night.  Feels full of fluid.  Denes CP.  Legs are looking better to her.    Objective: Filed Vitals:   08/01/14 1320 08/01/14 1604 08/01/14 2129 08/02/14 0613  BP: 80/41 106/51 110/70 113/54  Pulse: 70 70 71 69  Temp: 97.6 F (36.4 C)  97.7 F (36.5 C) 97.4 F (36.3 C)  TempSrc: Oral  Oral Oral  Resp: 16  16 16   Height:      Weight:    157.444 kg (347 lb 1.6 oz)  SpO2: 96%  97% 98%    Intake/Output Summary (Last 24 hours) at 08/02/14 0945 Last data filed at 08/02/14 8242  Gross per 24 hour  Intake   1163 ml  Output   1350 ml  Net   -187 ml   Filed Weights   07/30/14 0611 08/01/14 0447 08/02/14 0613  Weight: 150.231 kg (331 lb 3.2 oz) 149.2 kg (328 lb 14.8 oz) 157.444 kg (347 lb 1.6 oz)    Exam:   General:  Obese CF, No acute distress  HEENT:  NCAT, MMM  Cardiovascular:  RRR, nl S1, S2 no mrg, 2+ pulses, warm extremities  Respiratory:  CTAB, no increased WOB  Abdomen:   NABS, soft, NT/ND  MSK:   Normal tone and bulk, bilateral lymphedema.  Dressings intact medial right thigh, medial left thigh and anterior left shin.  Did not remove this morning to examine underneath > previously evaluated by surgery.  Edematous but soft.  Erythema improving on bilateral lower extremities.    Neuro:  Grossly intact but diffusely weak.    Skin:  Erythematous macular rash with some excoriation, confluent on back and present on trunk more than extremities.    Data Reviewed: Basic Metabolic Panel:  Recent Labs Lab 07/29/14 0435  07/30/14 0500 07/31/14 0442 08/01/14 0438 08/02/14 0421  NA 134* 133* 135* 135* 134*  K 4.1 4.7 5.5* 5.2 6.1*  CL 94* 93* 97 97 98  CO2 27 26 29 26 24   GLUCOSE 146* 138* 137* 126* 168*  BUN 92* 92* 93* 92* 85*  CREATININE 2.76* 2.97* 3.00* 2.77* 2.57*  CALCIUM 9.3 9.0 9.3 9.6 9.5   Liver Function Tests: No results found for this basename: AST, ALT, ALKPHOS, BILITOT, PROT, ALBUMIN,  in the last 168 hours No results found for this basename: LIPASE, AMYLASE,  in the last 168 hours No results found for this basename: AMMONIA,  in the last 168 hours CBC:  Recent Labs Lab 07/28/14 0448 07/29/14 0435 07/30/14 0500 07/31/14 0442 08/01/14 0438  WBC 6.2 6.4 6.6 6.7 7.0  HGB 9.4* 9.4* 9.3* 9.5* 9.8*  HCT 29.7* 29.1* 28.9* 29.6*  31.1*  MCV 97.4 95.1 96.0 96.4 97.2  PLT 221 240 260 257 278   Cardiac Enzymes: No results found for this basename: CKTOTAL, CKMB, CKMBINDEX, TROPONINI,  in the last 168 hours BNP (last 3 results) No results found for this basename: PROBNP,  in the last 8760 hours CBG:  Recent Labs Lab 07/31/14 1720 07/31/14 2133 08/01/14 0728 08/01/14 1145 08/01/14 1654  GLUCAP 161* 142* 142* 148* 152*    Recent Results (from the past 240 hour(s))  CULTURE, BLOOD (ROUTINE X 2)     Status: None   Collection Time    07/23/14  6:35 PM      Result Value Ref Range Status   Specimen Description BLOOD LEFT ANTECUBITAL   Final   Special Requests BOTTLES DRAWN AEROBIC AND ANAEROBIC 5ML   Final   Culture  Setup Time     Final   Value: 07/23/2014 22:16     Performed at [facility]   Culture     Final   Value: BACTEROIDES FRAGILIS     Note: BETA LACTAMASE POSITIVE     Note: Gram Stain Report Called to,Read Back By and Verified With: Rulon Abide RN on 07/24/14 at 20:45 by Rise Mu     Performed at [facility]   Report Status 07/26/2014 FINAL   Final  CULTURE, BLOOD (ROUTINE X 2)     Status: None   Collection Time    07/23/14  6:38 PM      Result  Value Ref Range Status   Specimen Description BLOOD RIGHT ANTECUBITAL   Final   Special Requests BOTTLES DRAWN AEROBIC AND ANAEROBIC 5ML   Final   Culture  Setup Time     Final   Value: 07/23/2014 22:16     Performed at [facility]   Culture     Final   Value: BACTEROIDES FRAGILIS     Note: CRITICAL RESULT CALLED TO, READ BACK BY AND VERIFIED WITH: JENNY BURNS RN @1015PM  Thelma Comp [id number] BETA LACTAMASE POSITIVE     Performed at [facility]   Report Status 07/26/2014 FINAL   Final  WOUND CULTURE     Status: None   Collection Time    07/24/14  3:44 PM      Result Value Ref Range Status   Specimen Description LEG   Final   Special Requests Normal   Final   Gram Stain     Final   Value: RARE SQUAMOUS EPITHELIAL CELLS PRESENT     RARE WBC PRESENT, PREDOMINANTLY PMN     FEW GRAM POSITIVE COCCI IN PAIRS     FEW GRAM NEGATIVE RODS     Performed at [facility]   Culture     Final   Value: FEW STAPHYLOCOCCUS AUREUS     Note: RIFAMPIN AND GENTAMICIN SHOULD NOT BE USED AS SINGLE DRUGS FOR TREATMENT OF STAPH INFECTIONS.     Performed at [facility]   Report Status 07/29/2014 FINAL   Final   Organism ID, Bacteria STAPHYLOCOCCUS AUREUS   Final  CULTURE, BLOOD (ROUTINE X 2)     Status: None   Collection Time    07/27/14 11:15 AM      Result Value Ref Range Status   Specimen Description BLOOD LEFT ARM   Final   Special Requests BOTTLES DRAWN AEROBIC AND ANAEROBIC 10CC EACH   Final   Culture  Setup Time     Final   Value: 07/27/2014 13:54  Performed at [facility]     Final   Value: NO GROWTH 5 DAYS     Performed at [facility]   Report Status 08/02/2014 FINAL   Final  CULTURE, BLOOD (ROUTINE X 2)     Status: None   Collection Time    07/27/14 11:20 AM      Result Value Ref Range Status   Specimen Description BLOOD RIGHT ARM   Final   Special Requests BOTTLES DRAWN AEROBIC AND ANAEROBIC 10CC EACH   Final   Culture  Setup  Time     Final   Value: 07/27/2014 13:54     Performed at [facility]   Culture     Final   Value: NO GROWTH 5 DAYS     Performed at [facility]   Report Status 08/02/2014 FINAL   Final  WOUND CULTURE     Status: None   Collection Time    07/31/14  1:09 PM      Result Value Ref Range Status   Specimen Description WOUND LEFT LEG   Final   Special Requests Normal   Final   Gram Stain     Final   Value: NO WBC SEEN     NO SQUAMOUS EPITHELIAL CELLS SEEN     NO ORGANISMS SEEN     Performed at [facility]   Culture     Final   Value: NO GROWTH 2 DAYS     Performed at [facility]   Report Status PENDING   Incomplete     Studies: No results found.  Scheduled Meds: . amiodarone  400 mg Oral BID  . calcitRIOL  0.25 mcg Oral BID  . calcium carbonate  1 tablet Oral QID   And  . cholecalciferol  200 Units Oral QID  . collagenase   Topical Daily  . famotidine  10 mg Oral BID  . febuxostat  80 mg Oral Daily  . gabapentin  100 mg Oral BID  . insulin aspart  0-15 Units Subcutaneous TID WC  . insulin glargine  28 Units Subcutaneous QHS  . iron polysaccharides  150 mg Oral BID  . levothyroxine  125 mcg Oral QAC breakfast  . linagliptin  5 mg Oral Daily  . metolazone  2.5 mg Oral Once  . metronidazole  500 mg Intravenous Q8H  . multivitamin with minerals  1 tablet Oral Daily  . mupirocin ointment   Topical BID  . nitroGLYCERIN  0.4 mg Transdermal QODAY  . saccharomyces boulardii  250 mg Oral BID  . sodium chloride  3 mL Intravenous Q12H  . torsemide  100 mg Oral Daily  . torsemide  50 mg Oral Daily  . Vitamin D (Ergocalciferol)  50,000 Units Oral Once per day on Mon Thu  . warfarin  2.5 mg Oral ONCE-1800  . Warfarin - Pharmacist Dosing Inpatient   Does not apply q1800   Continuous Infusions:   Principal Problem:   Cellulitis of lower leg Active Problems:   OBESITY, MORBID   HYPERTENSION   PACEMAKER, PERMANENT   Obesity hypoventilation  syndrome   Hypothyroidism, acquired, autoimmune   Solitary kidney, acquired   Cardiomyopathy, nonischemic- EF 30-35% June 2014   Type 2 diabetes mellitus   CKD (chronic kidney disease) stage 4, GFR 15-29 ml/min   PAF (paroxysmal atrial fibrillation)   Hypocalcemia   Cellulitis and abscess    Time spent: 30 min    [place],  [facility] Pager [phone]. If 7PM-7AM, please contact night-coverage at [email or web address], password [facility] 08/02/2014, 9:45 AM  LOS: 10 days

## 2014-08-02 NOTE — Progress Notes (Signed)
Rechecked patient after the administration of Pepcid, Atarax, and antibiotic changed to Flagyl patient is not itching anymore the rash is still there but the itching has subsided. Will continue to monitor patient throughout the night.

## 2014-08-02 NOTE — Progress Notes (Signed)
Patient complained of rash and itching on back at top then down the middle, and on the chest and upper arms. Lower legs are itching but no rash.No new medications have been given to patient. Paged PA to get some thing for the itching. Will continue to monitor patient.

## 2014-08-02 NOTE — Progress Notes (Signed)
Physical Therapy Hydrotherapy Treatment Note   08/02/14 1300  Subjective Assessment  Subjective Now to perform hydrotherapy to both L and R medial thigh wounds.  Patient and Family Stated Goals healing LE wounds  Prior Treatments bedside debridement  Evaluation and Treatment  Evaluation and Treatment Procedures Explained to Patient/Family Yes  Evaluation and Treatment Procedures agreed to  Wound / Incision (Open or Dehisced) 07/29/14 Other (Comment) Thigh Left;Medial HYDRO  Date First Assessed/Time First Assessed: 07/29/14 1125   Wound Type: (c) Other (Comment)  Location: Thigh  Location Orientation: Left;Medial  Wound Description (Comments): HYDRO  Present on Admission: Yes  Dressing Type Transparent dressing;Moist to dry (santyl)  Dressing Status Clean;Intact;New drainage  Dressing Change Frequency Twice a day  Site / Wound Assessment Bleeding;Red;Yellow  % Wound base Red or Granulating 30%  % Wound base Yellow 70%  Peri-wound Assessment Erythema (non-blanchable)  Drainage Amount Moderate  Drainage Description Serosanguineous  Non-staged Wound Description Not applicable  Treatment Debridement (Selective);Hydrotherapy (Pulse lavage);Packing (Saline gauze)  Wound / Incision (Open or Dehisced) 08/02/14 Other (Comment) Thigh Right;Medial Hydro, R medial thigh wound  Date First Assessed: 08/02/14   Wound Type: Other (Comment)  Location: Thigh  Location Orientation: Right;Medial  Wound Description (Comments): Hydro, R medial thigh wound  Present on Admission: Yes  Dressing Type Transparent dressing;Gauze (Comment) (santyl)  Dressing Status Dry;Intact;Clean  Site / Wound Assessment Yellow  % Wound base Red or Granulating 0%  % Wound base Yellow 100%  Peri-wound Assessment Erythema (non-blanchable)  Wound Length (cm) 3 cm  Wound Width (cm) 2 cm  Wound Depth (cm) 1 cm  Drainage Amount Scant  Drainage Description Serous  Treatment Debridement (Selective);Hydrotherapy (Pulse lavage)   Hydrotherapy  Pulsed Lavage with Suction (psi) 8 psi  Pulsed Lavage with Suction - Normal Saline Used 1000 mL  Pulsed Lavage Tip Tip with splash shield  Pulsed lavage therapy - wound location L and R medial thigh  Selective Debridement  Selective Debridement - Location L and R medial thigh wound  Selective Debridement - Tools Used Scissors;Forceps  Selective Debridement - Tissue Removed yellow slough  Wound Therapy - Assess/Plan/Recommendations  Wound Therapy - Clinical Statement Pt would benefit from hydrotherapy to assist with removing necrotic tissue and promote wound healing environment.  Factors Delaying/Impairing Wound Healing Diabetes Mellitus;Immobility;Multiple medical problems  Hydrotherapy Plan Debridement;Dressing change;Patient/family education  Wound Therapy - Frequency 6X / week  Wound Therapy - Follow Up Recommendations [place]  Wound Plan To perform hydrotherapy to L and R medial open thigh wound to remove necrotic tissue and facilitate wound healing.  Wound Therapy Goals - Improve the function of patient's integumentary system by progressing the wound(s) through the phases of wound healing by:  Decrease Necrotic Tissue to 50%  Decrease Necrotic Tissue - Progress Progressing toward goal  Increase Granulation Tissue to 50%  Increase Granulation Tissue - Progress Progressing toward goal  Improve Drainage Characteristics Min  Improve Drainage Characteristics - Progress Progressing toward goal  Goals/treatment plan/discharge plan were made with and agreed upon by patient/family Yes  Time For Goal Achievement 2 weeks  Wound Therapy - Potential for Goals Good  Time: 9326-7124  Carmelia Bake, PT, DPT 08/02/2014 Pager: [phone]

## 2014-08-02 NOTE — Progress Notes (Signed)
ANTICOAGULATION CONSULT NOTE - Follow Up  Pharmacy Consult for warfarin Indication: afib   Allergies  Allergen Reactions  . Ivp Dye [Iodinated Diagnostic Agents] Shortness Of Breath    Turn red, can't breathe  . Betadine [Povidone Iodine]   . Diphenhydramine Hcl Swelling    In hands and eyes  . Fish Allergy Nausea And Vomiting  . Iohexol      Desc: PT TURNS RED AND WHEEZING   . Penicillins Other (See Comments)    Resp arrest as child  . Povidone-Iodine Other (See Comments)    Wheezing, turn red, can't breath, and blisters  . Promethazine Hcl Other (See Comments)    Low Blood Pressure  . Tape     Blisters, can use paper tape for short periods  . Clindamycin Hives and Rash    wheezing  . Iodine Rash  . Morphine Sulfate Nausea And Vomiting and Rash  . Primaxin [Imipenem] Rash    Patient Measurements: Height: 5\' 6"  (167.6 cm) Weight: 347 lb 1.6 oz (157.444 kg) IBW/kg (Calculated) : 59.3 Vital Signs: Temp: 97.4 F (36.3 C) (08/03 0613) Temp src: Oral (08/03 0613) BP: 113/54 mmHg (08/03 0613) Pulse Rate: 69 (08/03 0613)  Labs:  Recent Labs  07/31/14 0442 08/01/14 0438 08/02/14 0421  HGB 9.5* 9.8*  --   HCT 29.6* 31.1*  --   PLT 257 278  --   LABPROT 20.2* 21.4* 23.2*  INR 1.72* 1.86* 2.06*  CREATININE 3.00* 2.77* 2.57*    Estimated Creatinine Clearance: 33.9 ml/min (by C-G formula based on Cr of 2.57).   Assessment: 40 YOF on chronic warfarin/amiodarone therapy for afib. INR supratherapeutic at admission.  Home dose may need to be decreased slightly due to recent increase in amiodarone dose (see below).  I/D of LLE cellulitis Friday, may need additional in coming days.     Home dose 2.5 mg MWF; 5mg  all other days  Missed 5 mg dose on 5/28 - not ordered  Doses received inpatient  INR today therapeutic since missed dose starting 7/29 - 7.5mg , 7.5mg , 5mg , 5mg , 5mg   CBC: stable. No bleeding reported per nursing.  BLE dopplers neg for DVT  Diet: heart  healthy, eating 50-100% of meals  Drug interactions:  Chronic amiodarone - dosage increased in May outpatient, possible contribution to elevated INR as effects are typically seen weeks/months into therapy.   Flagyl just started PM 8/2 after rash to Primaxin likely developed - significant interaction with warfarin present and will likely require dose decrease of warfarin as long as Flagyl is continued  Goal of Therapy:  INR 2-3   Plan:  1) INR has been steadily increasing giving 5mg  last 3 days - due to now concurrent flagyl will reduce dose to 2.5mg  tonight 2) Daily INR  Adrian Saran, PharmD, BCPS Pager [phone] 08/02/2014 8:33 AM

## 2014-08-03 LAB — BASIC METABOLIC PANEL
ANION GAP: 9 (ref 5–15)
BUN: 85 mg/dL — ABNORMAL HIGH (ref 6–23)
CALCIUM: 9.4 mg/dL (ref 8.4–10.5)
CO2: 26 mEq/L (ref 19–32)
Chloride: 99 mEq/L (ref 96–112)
Creatinine, Ser: 2.6 mg/dL — ABNORMAL HIGH (ref 0.50–1.10)
GFR calc Af Amer: 21 mL/min — ABNORMAL LOW (ref >90)
GFR, EST NON AFRICAN AMERICAN: 18 mL/min — AB (ref >90)
Glucose, Bld: 120 mg/dL — ABNORMAL HIGH (ref 70–99)
Potassium: 4.4 mEq/L (ref 3.7–5.3)
Sodium: 134 mEq/L — ABNORMAL LOW (ref 137–147)

## 2014-08-03 LAB — WOUND CULTURE
CULTURE: NO GROWTH
Gram Stain: NONE SEEN
Special Requests: NORMAL

## 2014-08-03 LAB — PROTIME-INR
INR: 2.37 — AB (ref 0.00–1.49)
Prothrombin Time: 25.9 seconds — ABNORMAL HIGH (ref 11.6–15.2)

## 2014-08-03 LAB — GLUCOSE, CAPILLARY: Glucose-Capillary: 115 mg/dL — ABNORMAL HIGH (ref 70–99)

## 2014-08-03 LAB — CORTISOL-AM, BLOOD: Cortisol - AM: 16.9 ug/dL (ref 4.3–22.4)

## 2014-08-03 MED ORDER — HYDROCORTISONE 1 % EX CREA: TOPICAL_CREAM | Freq: Two times a day (BID) | CUTANEOUS | Status: DC

## 2014-08-03 MED ORDER — WARFARIN SODIUM 1 MG PO TABS
1.0000 mg | ORAL_TABLET | Freq: Once | ORAL | Status: DC
  Filled 2014-08-03: qty 1

## 2014-08-03 MED ORDER — LINEZOLID 600 MG PO TABS: 600.0000 mg | ORAL_TABLET | Freq: Two times a day (BID) | ORAL | Status: DC

## 2014-08-03 MED ORDER — CAMPHOR-MENTHOL 0.5-0.5 % EX LOTN: 1.0000 "application " | TOPICAL_LOTION | CUTANEOUS | Status: DC | PRN

## 2014-08-03 MED ORDER — COLLAGENASE 250 UNIT/GM EX OINT: TOPICAL_OINTMENT | Freq: Every day | CUTANEOUS | Status: DC

## 2014-08-03 MED ORDER — METOLAZONE 2.5 MG PO TABS
2.5000 mg | ORAL_TABLET | Freq: Once | ORAL | Status: AC
  Administered 2014-08-03: 2.5 mg via ORAL
  Filled 2014-08-03: qty 1

## 2014-08-03 MED ORDER — SACCHAROMYCES BOULARDII 250 MG PO CAPS: 250.0000 mg | ORAL_CAPSULE | Freq: Two times a day (BID) | ORAL | Status: DC

## 2014-08-03 MED ORDER — METOPROLOL TARTRATE 25 MG PO TABS: 12.5000 mg | ORAL_TABLET | Freq: Two times a day (BID) | ORAL | Status: DC

## 2014-08-03 MED ORDER — METRONIDAZOLE 500 MG PO TABS: 500.0000 mg | ORAL_TABLET | Freq: Three times a day (TID) | ORAL | Status: DC

## 2014-08-03 MED ORDER — MUPIROCIN 2 % EX OINT: TOPICAL_OINTMENT | Freq: Two times a day (BID) | CUTANEOUS | Status: AC

## 2014-08-03 MED ORDER — OXYCODONE-ACETAMINOPHEN 5-325 MG PO TABS: 1.0000 | ORAL_TABLET | ORAL | Status: DC | PRN

## 2014-08-03 MED ORDER — HYDROXYZINE HCL 25 MG PO TABS: 25.0000 mg | ORAL_TABLET | ORAL | Status: AC | PRN

## 2014-08-03 MED ORDER — ALPRAZOLAM 0.25 MG PO TABS: 0.2500 mg | ORAL_TABLET | Freq: Three times a day (TID) | ORAL | Status: DC | PRN

## 2014-08-03 NOTE — Progress Notes (Signed)
Physical Therapy Treatment Patient Details Name: [name] MRN: [id number] DOB: 12/16/1950 Today's Date: 10-Aug-2014    History of Present Illness 64 y.o. 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     PT Comments    Pt able to ambulate in hallway again with a few standing rest breaks due to LE weakness and fatigue.  Follow Up Recommendations  Supervision for mobility/OOB;SNF     Equipment Recommendations  None recommended by PT    Recommendations for Other Services       Precautions / Restrictions Precautions Precautions: Fall    Mobility  Bed Mobility Overal bed mobility: Modified Independent Bed Mobility: Supine to Sit     Supine to sit: Modified independent (Device/Increase time)        Transfers Overall transfer level: Needs assistance Equipment used: Rolling walker (2 wheeled) Transfers: Sit to/from Stand Sit to Stand: +2 safety/equipment;From elevated surface;Min guard         General transfer comment: min/guard from elevated bed   Ambulation/Gait Ambulation/Gait assistance: +2 safety/equipment;Min guard Ambulation Distance (Feet): 40 Feet Assistive device: Rolling walker (2 wheeled) Gait Pattern/deviations: Step-through pattern;Decreased stride length;Wide base of support;Trunk flexed Gait velocity: decr   General Gait Details: fatigues quickly, able to tolerate a little farther distance before LEs started buckling near end of ambulation, recliner following   Stairs            Wheelchair Mobility    Modified Rankin (Stroke Patients Only)       Balance                                    Cognition Arousal/Alertness: Awake/alert Behavior During Therapy: WFL for tasks assessed/performed Overall Cognitive Status: Within  Functional Limits for tasks assessed                      Exercises      General Comments        Pertinent Vitals/Pain Activity to tolerance    Home Living                      Prior Function            PT Goals (current goals can now be found in the care plan section) Acute Rehab PT Goals PT Goal Formulation: With patient Time For Goal Achievement: 08/09/14 Potential to Achieve Goals: Good Progress towards PT goals: Progressing toward goals    Frequency  Min 3X/week    PT Plan Current plan remains appropriate    Co-evaluation             End of Session Equipment Utilized During Treatment: Gait belt Activity Tolerance: Patient limited by fatigue Patient left: in chair;with call bell/phone within reach     Time: 1158-1210 PT Time Calculation (min): 12 min  Charges:  $Gait Training: 8-22 mins                    G Codes:      Lucie Friedlander,KATHrine E 08-10-14, 1:33 PM Carmelia Bake, PT, DPT August 10, 2014 Pager: [phone]

## 2014-08-03 NOTE — Progress Notes (Signed)
ANTICOAGULATION CONSULT NOTE - Follow Up  Pharmacy Consult for warfarin Indication: afib   Allergies  Allergen Reactions  . Ivp Dye [Iodinated Diagnostic Agents] Shortness Of Breath    Turn red, can't breathe  . Betadine [Povidone Iodine]   . Diphenhydramine Hcl Swelling    In hands and eyes  . Fish Allergy Nausea And Vomiting  . Iohexol      Desc: PT TURNS RED AND WHEEZING   . Penicillins Other (See Comments)    Resp arrest as child  . Povidone-Iodine Other (See Comments)    Wheezing, turn red, can't breath, and blisters  . Promethazine Hcl Other (See Comments)    Low Blood Pressure  . Tape     Blisters, can use paper tape for short periods  . Clindamycin Hives and Rash    wheezing  . Iodine Rash  . Morphine Sulfate Nausea And Vomiting and Rash  . Primaxin [Imipenem] Rash    Patient Measurements: Height: 5\' 6"  (167.6 cm) Weight: 340 lb 9.8 oz (154.5 kg) IBW/kg (Calculated) : 59.3 Vital Signs: Temp: 97.7 F (36.5 C) (08/04 0549) Temp src: Oral (08/04 0549) BP: 106/55 mmHg (08/04 0549) Pulse Rate: 72 (08/04 0549)  Labs:  Recent Labs  08/01/14 0438 08/02/14 0421 08/03/14 0400  HGB 9.8*  --   --   HCT 31.1*  --   --   PLT 278  --   --   LABPROT 21.4* 23.2* 25.9*  INR 1.86* 2.06* 2.37*  CREATININE 2.77* 2.57* 2.60*    Estimated Creatinine Clearance: 33.2 ml/min (by C-G formula based on Cr of 2.6).   Assessment: 68 YOF on chronic warfarin/amiodarone therapy for afib. INR supratherapeutic at admission.  Home dose may need to be decreased slightly due to recent increase in amiodarone dose (see below).  I/D of LLE cellulitis Friday, may need additional in coming days.     Home dose 2.5 mg MWF; 5mg  all other days  Missed 5 mg dose on 5/28 - not ordered  Doses received inpatient since missed dose: 7.5mg , 7.5mg , 5mg , 5mg , 5mg , 2.5mg   INR today therapeutic but rising  CBC: stable. No bleeding reported per nursing.  BLE dopplers neg for DVT  Diet: heart  healthy, eating 25-75% of meals  Drug interactions:  Chronic amiodarone - dosage increased in May outpatient, possible contribution to elevated INR as effects are typically seen weeks/months into therapy.   Flagyl just started PM 8/2 after rash to Primaxin likely developed - significant interaction with warfarin present and will likely require dose decrease of warfarin as long as Flagyl is continued  Goal of Therapy:  INR 2-3   Plan:  1) Anticipate INR to continue to rise due to Flagyl so will reduce dose to 1mg  warfarin tonight 2) Daily INR  Adrian Saran, PharmD, BCPS Pager [phone] 08/03/2014 8:06 AM

## 2014-08-03 NOTE — Progress Notes (Signed)
Patient is set to discharge to [facility] today. Patient & husband aware. Discharge packet in [place], [place] aware. [facility] scheduled for transport pickup @ 2:00pm.   Clinical Social Work Department CLINICAL SOCIAL WORK PLACEMENT NOTE 08/03/2014  Patient:  [name],[name]  Account Number:  [id number] Admit date:  07/23/2014  Clinical Social Worker:  Renold Genta  Date/time:  08/03/2014 12:33 PM  Clinical Social Work is seeking post-discharge placement for this patient at the following level of care:   [place]   (*CSW will update this form in Epic as items are completed)   08/03/2014  Patient/family provided with [place] Department of Clinical Social Work's list of facilities offering this level of care within the geographic area requested by the patient (or if unable, by the patient's family).  08/03/2014  Patient/family informed of their freedom to choose among providers that offer the needed level of care, that participate in Medicare, Medicaid or managed care program needed by the patient, have an available bed and are willing to accept the patient.  08/03/2014  Patient/family informed of [facility]' ownership interest in [facility], as well as of the fact that they are under no obligation to receive care at this facility.  PASARR submitted to EDS on 08/03/2014 PASARR number received on 08/03/2014  FL2 transmitted to all facilities in geographic area requested by pt/family on  08/03/2014 FL2 transmitted to all facilities within larger geographic area on   Patient informed that his/her managed care company has contracts with or will negotiate with  certain facilities, including the following:     Patient/family informed of bed offers received:  08/03/2014 Patient chooses bed at [facility]ician recommends and patient chooses bed at    Patient to be transferred to [facility] on   08/03/2014 Patient to be transferred to facility by [facility] Patient and family notified of transfer on 08/03/2014 Name of family member notified:  patient's husband  The following physician request were entered in Epic:   Additional Comments:     Raynaldo Opitz, [place] Social Worker cell #: [phone]

## 2014-08-03 NOTE — Discharge Summary (Addendum)
Physician Discharge Summary  [name] EYC:[id number] DOB: 10-Apr-1950 DOA: 07/23/2014  PCP: Elby Showers, MD  Admit date: 07/23/2014 Discharge date: 08/03/2014  Recommendations for Outpatient Follow-up:  1. F/u with cardiology in 1-2 weeks for weight check and heart failure management.  Please check BMP at next appointment to check kidney function.  Consider increasing metoprolol back to previous dose of 37.5mg  BID if blood pressure tolerates (her baseline is usually JSH-70Y systolic).  Please check BMP to check potassium and restart potassium supplements if needed.  2. F/u with infectious disease in 2 weeks for follow up blood stream infection with Bacteriodes. 3. Twice daily dressing changes, saline rinse followed by santyl wet-to-dry packing.  Please make sure to pack the entire tract of the left leg ulcer, tracks at 4 o-clock!  Dressing changes BID.  Mupirocin for the other ulcers and blisters twice daily with dry dressings as before.   4. INR check with CBC in 1 week 5. Continue linezolid and flagyl through 8/9.  Discharge Diagnoses:  Principal Problem:   Cellulitis of lower leg Active Problems:   OBESITY, MORBID   HYPERTENSION   PACEMAKER, PERMANENT   Obesity hypoventilation syndrome   Hypothyroidism, acquired, autoimmune   Solitary kidney, acquired   Cardiomyopathy, nonischemic- EF 30-35% June 2014   Type 2 diabetes mellitus   CKD (chronic kidney disease) stage 4, GFR 15-29 ml/min   PAF (paroxysmal atrial fibrillation)   Hypocalcemia   Cellulitis and abscess   Discharge Condition: Stable, improved  Diet recommendation: Healthy heart, diabetic, 1.2 L fluid restriction  Wt Readings from Last 3 Encounters:  08/03/14 154.5 kg (340 lb 9.8 oz)  06/02/14 149.233 kg (329 lb)  06/02/14 149.233 kg (329 lb)    History of present illness:  The patient is a 64 year old female with bilateral lower extremity lymphedema, nonischemic cardiomyopathy with chronic systolic heart  failure, chronic lower extremity wounds followed by the wound care center, chronic kidney disease stage III, diabetes, atrial fibrillation on chronic Coumadin who presented with bilateral lower extremity cellulitis. She had a fall prior to discharge which caused a traumatic ulcer of the left medial thigh which was thought to be a source of infection.  Hospital Course:   Cellulitis of bilateral lower extremities. She had ulcers of the medial bilateral thighs, left worse than right. This is felt to be due to severe lymphedema and traumatic injury. CT of the leg demonstrated soft tissue edema of both thighs consistent with cellulitis with no focal underlying fluid collection or evidence of osteomyelitis. Duplex of the lower extremities were negative for DVT. Wound culture from her left leg ulcer grew multidrug resistant MSSA.  She was started on empiric antibiotics.  Infectious disease recommended 14 days of antibiotics.  Due to some necrosis of the left medial thigh ulcer, she underwent debridement by General surgery on 7/30.  She underwent hydrotherapy once daily during hospitalization of the bilateral medial thigh wounds with santyl applied with wet-to-dry dressings twice daily.  See above for dressing care.  She will need to followup in the wound care center.  Drug allergy likely secondary to Primaxin. She had a reported penicillin allergy, however the allergy was unclear. She was started on Primaxin after the results of her blood culture grew beta lactamase positive Bacteroides. She received antibiotic for several days and then suddenly developed a diffuse maculopapular pruritic rash, worse on her back and trunk but present on her extremities as well. Her Primaxin was discontinued and she was started on hydroxyzine and  hydrocortisone cream.  Although she requested systemic steroids, and these were deferred because of her underlying diabetes. Did not want to interfere with wound healing or  infection.  Beta-lactamase positive Bacteroides fragilis bacteremia/sepsis. Blood cultures from 7/24 grew Bacteroides for Jiles. CT scan of the abdomen and pelvis to evaluate for source demonstrated no obvious source of infection. Her urinalysis was negative. Repeat blood cultures from 7/28 were no growth to date. Infectious disease was consulted. Her antibiotics were transitioned to imipenem, however she developed a drug allergy rash so this was discontinued. She was transitioned to Flagyl. Per infectious disease she is to complete a 14 day course.   Type 2 diabetes mellitus with hemoglobin A1c of 7 in 02/2014. Her CBGs remained stable. She should continue her home insulin regimen.  Paroxysmal atrial fibrillation, paced rhythm on symmetry, rate controlled. Telemetry was discontinued on 7/29. Coumadin was dosed by pharmacy and her INR remained therapeutic. She continued amiodarone and reduced dose metoprolol.    Acute on chronic systolic congestive heart failure.  She has a dilated nonischemic cardiomyopathy, ejection fraction of 30-35% with diffuse hypokinesis, severe mitral valve regurgitation, dilated left atrium, right atrium.  Tricuspid valve  regurgitation and pulmonary hypertension. Her weight initially remained stable at approximately 150 kg despite holding her diuretics secondary to acute kidney injury. Suddenly over the course of 24 hours, her weight increased by 7 kg.  Her diuretics have been resumed and she was given an additional dose of metolazone and her weight has started to trend down again.    Hypotension to 80/30s, may have been related to drug allergy.  Cortisol level was wnl.  Diuretics and BP medicatiosn were held and BP improved.  Metoprolol resumed at 12.5mg  BID.  She may have a lower resting BP given her heart failure.    Acute on CKD (chronic kidney disease) stage 4, GFR 15-29 ml/min; h/o nephrectomy, creatinine peaked at 3 on 8/1 and trended down to 2.6.  May have been caused by  vancomycin or allergy to primaxin.  FENa was prerenal.  Diuretics now resumed.    Left renal exophytic mass. Unable to perform CT/MRI with contrast due to CKD and [place] unlikely to be helpful given body habitus. Repeat imaging in 3-6 months.  Obesity hypoventilation syndrome, stable. CPAP each bedtime  OBESITY, MORBID; cont encourage weigh loss, diet exercise  Leg pain, neuropathy, stable. Continue neurontin and prn opioids  H/o IDA: no acute s/s of bleeding; h/o MGUS - Iron studies wnl  - Defer to PCP/hematology  Hyperkalemia, not on potassium supplements but may be due to CKD stage IV and withheld diuretics vs. Hemolysis.  Calcium gluconate 1gm IV once.  Resumed diuretics.   Hypocalcemia Continue with calcium, vitamin D supplementation. Monitor lites periodically    Consultants:  Infectious disease  General surgery Procedures:  CT lower extremity  Duplex lower extremity Antibiotics:  vanc 7/24 >> 7/28  Levofloxacin 7/24 >>> 7/28  Metronidazole 7/27>>> 7/28  Imipenem 7/28 >>> 8/2  Flagyl 8/2 >>  Linezolid 8/2 >>  Discharge Exam: Filed Vitals:   08/03/14 0549  BP: 106/55  Pulse: 72  Temp: 97.7 F (36.5 C)  Resp: 18   Filed Vitals:   08/02/14 0613 08/02/14 1441 08/02/14 2129 08/03/14 0549  BP: 113/54 102/57 116/77 106/55  Pulse: 69 76 75 72  Temp: 97.4 F (36.3 C) 97.8 F (36.6 C) 97.2 F (36.2 C) 97.7 F (36.5 C)  TempSrc: Oral Oral Oral Oral  Resp: 16 18 18 18   Height:  Weight: 157.444 kg (347 lb 1.6 oz)   154.5 kg (340 lb 9.8 oz)  SpO2: 98% 97% 96% 98%    General: Obese CF, No acute distress  HEENT: NCAT, MMM  Cardiovascular: RRR, nl S1, S2 no mrg, 2+ pulses, warm extremities  Respiratory: CTAB, no increased WOB  Abdomen: NABS, soft, NT/ND  MSK:  Normal tone and bulk, bilateral lymphedema.  Edematous but soft. Erythema improving on bilateral lower extremities.  Left leg ulcer with granulation tissue at base, some yellow at the 6-o'clock position and  tracking at 4 o'clock but improved from prior.  Ulcer left leg with yellow sides and small gray base.   Neuro: Grossly intact but diffusely weak.  Skin: Erythematous macular rash with some excoriation, confluent on back and present on trunk more than extremities.    Discharge Instructions      Discharge Instructions   (HEART FAILURE PATIENTS) Call MD:  Anytime you have any of the following symptoms: 1) 3 pound weight gain in 24 hours or 5 pounds in 1 week 2) shortness of breath, with or without a dry hacking cough 3) swelling in the hands, feet or stomach 4) if you have to sleep on extra pillows at night in order to breathe.    Complete by:  As directed      Call MD for:  difficulty breathing, headache or visual disturbances    Complete by:  As directed      Call MD for:  extreme fatigue    Complete by:  As directed      Call MD for:  hives    Complete by:  As directed      Call MD for:  persistant dizziness or light-headedness    Complete by:  As directed      Call MD for:  persistant nausea and vomiting    Complete by:  As directed      Call MD for:  redness, tenderness, or signs of infection (pain, swelling, redness, odor or green/yellow discharge around incision site)    Complete by:  As directed      Call MD for:  severe uncontrolled pain    Complete by:  As directed      Call MD for:  temperature >100.4    Complete by:  As directed      Diet - low sodium heart healthy    Complete by:  As directed      Diet Carb Modified    Complete by:  As directed      Discharge instructions    Complete by:  As directed   You were admitted with wound infection and cellulitis.  Please continue linezolid and flagyl through 8/9, then stop.  You should continue hydrotherapy with wet to dry dressings of the ulcers on both legs for now.  Continue mupirocin on your other ulcers and blisters as before.  Please stop your metoprolol for now due to recently low blood pressures, but resume your lasix and  metolazone.  Please follow up with your cardiologist at your already scheduled appointment on 8/6.  Follow up with the wound care center within 1 week and with infectious disease within 2 weeks.  I have stopped your potassium for now because of recently high potassium levels but your cardiologist can recheck your levels at your next appointment and resume this medication if needed.     Increase activity slowly    Complete by:  As directed  Medication List    STOP taking these medications       HYDROcodone-acetaminophen 10-325 MG per tablet  Commonly known as:  NORCO     metoprolol tartrate 25 MG tablet  Commonly known as:  LOPRESSOR     potassium chloride SA 20 MEQ tablet  Commonly known as:  K-DUR,KLOR-CON      TAKE these medications       albuterol 108 (90 BASE) MCG/ACT inhaler  Commonly known as:  PROVENTIL HFA;VENTOLIN HFA  Inhale 2 puffs into the lungs every 6 (six) hours as needed for wheezing or shortness of breath.     albuterol (2.5 MG/3ML) 0.083% nebulizer solution  Commonly known as:  PROVENTIL  Take 2.5 mg by nebulization every 6 (six) hours as needed for wheezing or shortness of breath.     ALPRAZolam 0.25 MG tablet  Commonly known as:  XANAX  Take 1 tablet (0.25 mg total) by mouth 3 (three) times daily as needed for anxiety.     amiodarone 200 MG tablet  Commonly known as:  PACERONE  Take 2 tablets (400 mg total) by mouth 2 (two) times daily.     calcitRIOL 0.25 MCG capsule  Commonly known as:  ROCALTROL  Take 0.25 mcg by mouth 2 (two) times daily.     calcium citrate-vitamin D 200-200 MG-UNIT Tabs  Take 1 tablet by mouth 4 (four) times daily.     camphor-menthol lotion  Commonly known as:  SARNA  Apply 1 application topically as needed for itching.     colchicine 0.6 MG tablet  Take 0.6 mg by mouth 2 (two) times daily.     collagenase ointment  Commonly known as:  SANTYL  Apply topically daily. Bilateral medial thigh ulcers.      COUMADIN 5 MG tablet  Generic drug:  warfarin  Take 2.5-5 mg by mouth daily. Takes 5 mg on Sunday, Tuesday, Thursday and Saturday and 2.5 mg all other days     glucose blood test strip  Commonly known as:  ONE TOUCH ULTRA TEST  Test 3 times daily     glyBURIDE 5 MG tablet  Commonly known as:  DIABETA  TAKE 1-2 TABLETS TWICE DAILY WITH A MEAL. TAKE 10MG  (2 TABS) IN THE A.M. AND 5MG (1 TAB) AT NIGHT.     hydrocortisone cream 1 %  Apply topically 2 (two) times daily.     hydrOXYzine 25 MG tablet  Commonly known as:  ATARAX/VISTARIL  Take 1 tablet (25 mg total) by mouth every 4 (four) hours as needed for itching.     insulin glargine 100 UNIT/ML injection  Commonly known as:  LANTUS  Inject 28 Units into the skin at bedtime.     Insulin Pen Needle 31G X 5 MM Misc  Commonly known as:  B-D UF III MINI PEN NEEDLES  Check blood sugar up to 6x daily     iron polysaccharides 150 MG capsule  Commonly known as:  NIFEREX  Take 150 mg by mouth 2 (two) times daily.     levothyroxine 125 MCG tablet  Commonly known as:  SYNTHROID, LEVOTHROID  Take 1 tablet (125 mcg total) by mouth daily before breakfast.     linezolid 600 MG tablet  Commonly known as:  ZYVOX  Take 1 tablet (600 mg total) by mouth every 12 (twelve) hours.     metolazone 5 MG tablet  Commonly known as:  ZAROXOLYN  Take 2.5 mg by mouth daily as needed (for weight gain). If up  4-5 lbs.     metroNIDAZOLE 500 MG tablet  Commonly known as:  FLAGYL  Take 1 tablet (500 mg total) by mouth 3 (three) times daily.     multivitamin tablet  Take 1 tablet by mouth daily.     mupirocin ointment 2 %  Commonly known as:  BACTROBAN  Apply topically 2 (two) times daily. Apply to lower extremity ulcers not being treated with hydrotherapy     nitroGLYCERIN 0.4 mg/hr patch  Commonly known as:  NITRODUR - Dosed in mg/24 hr  Place 1 patch (0.4 mg total) onto the skin every other day.     nitroGLYCERIN 0.4 MG SL tablet  Commonly known  as:  NITROSTAT  Place 0.4 mg under the tongue every 5 (five) minutes as needed. For chest pain     oxyCODONE-acetaminophen 5-325 MG per tablet  Commonly known as:  PERCOCET/ROXICET  Take 1-2 tablets by mouth every 4 (four) hours as needed for severe pain.     saccharomyces boulardii 250 MG capsule  Commonly known as:  FLORASTOR  Take 1 capsule (250 mg total) by mouth 2 (two) times daily.     sitaGLIPtin 100 MG tablet  Commonly known as:  JANUVIA  Take 100 mg by mouth daily.     torsemide 100 MG tablet  Commonly known as:  DEMADEX  Take 50-100 mg by mouth 2 (two) times daily. 100 mg in the am and 50 mg in the pm     ULORIC 40 MG tablet  Generic drug:  febuxostat  Take 80 mg by mouth daily. Take two tablet     Vitamin D (Ergocalciferol) 50000 UNITS Caps capsule  Commonly known as:  DRISDOL  Take 50,000 Units by mouth 2 (two) times a week. Takes on Tuesdays and Fridays     VOLTAREN 1 % Gel  Generic drug:  diclofenac sodium  Apply 2 g topically 4 (four) times daily as needed (for pain).       Follow-up Information   Follow up with Elby Showers, MD. Schedule an appointment as soon as possible for a visit in 2 weeks.   Specialty:  Internal Medicine   Contact information:   [address] [id number] [phone]       Follow up with CROITORU,MIHAI, MD. Schedule an appointment as soon as possible for a visit in 10 days.   Specialty:  Cardiology   Contact information:   [address] [place] [address] [phone]       Follow up with Alcide Evener, MD. Schedule an appointment as soon as possible for a visit in 2 weeks.   Specialty:  Infectious Diseases   Contact information:   [address] [place] [address] [phone]        The results of significant diagnostics from this hospitalization (including imaging, microbiology, ancillary and laboratory) are listed below for reference.     Significant Diagnostic Studies: Ct Abdomen Pelvis Wo Contrast  07/28/2014   CLINICAL DATA:  Right side abdominal pain, bacteria in urine  EXAM: CT ABDOMEN AND PELVIS WITHOUT CONTRAST  TECHNIQUE: Multidetector CT imaging of the abdomen and pelvis was performed following the standard protocol without IV contrast.  COMPARISON:  01/22/2011  FINDINGS: Lung bases are unremarkable. Sagittal images of the spine shows disc space flattening with vacuum disc phenomenon at L5-S1 level. Mild disc space flattening with posterior disc bulge at L4-L5 level. Unenhanced liver shows no biliary ductal dilatation. The  patient is status postcholecystectomy. The pancreas, spleen and adrenal glands are unremarkable. Unenhanced kidneys shows no nephrolithiasis. The left kidney is smaller in size. Probable postsurgical changes lower pole of the left kidney. There is a cyst in inferior aspect of the left kidney measures 1.3 cm. No hydronephrosis or hydroureter. No calcified ureteral calculi.  Moderate colonic stool. Oral contrast was given to the patient. No aortic aneurysm. No small bowel obstruction. The terminal ileum is unremarkable. Moderate distended urinary bladder. There is a isodense kidney lesion upper pole of the left kidney measures 1 cm. This cannot be characterized without IV contrast. Further correlation with enhanced CT or MRI is recommended.  Right kidney is mi rotated. Probable cyst in lower pole of the right kidney measures 1.1 cm.  The uterus is unremarkable. No calcified stones are noted within urinary bladder. No inguinal adenopathy. Small bilateral inguinal lymph nodes are noted. No destructive bony lesions are noted within pelvis.  IMPRESSION: 1. No nephrolithiasis.  No hydronephrosis or hydroureter. 2. Isodense exophytic lesion in upper pole of the left kidney measures 1 cm. Further correlation with enhanced CT or MRI is recommended. Probable postsurgical changes lower pole of the left kidney. There is a cyst in  inferior aspect of the left kidney measures 1.3 cm. 3. No calcified ureteral calculi. 4. No small bowel obstruction.  Moderate colonic stool. 5. Moderate distended urinary bladder. 6. Degenerative changes lumbar spine L4-L5 and L5-S1 level.   Electronically Signed   By: Lahoma Crocker M.D.   On: 07/28/2014 16:23   Ct Extrem Lower Wo Cm Bil  07/27/2014   CLINICAL DATA:  Open wounds involving both distal thighs medially. Evaluate cellulitis.  EXAM: CT OF THE LOWER BILATERAL EXTREMITY WITHOUT CONTRAST  TECHNIQUE: Multidetector CT imaging of both thighs was performed according to the standard protocol. Images extend from the lower pelvis through the proximal lower legs. No contrast was administered.  COMPARISON:  None.  FINDINGS: There is no evidence of acute fracture or dislocation. There are no significant arthropathic changes at either hip. There are advanced tricompartmental degenerative changes at both knees with large osteophytes. There are possible intra-articular loose bodies, especially on the right.  There is diffuse muscular atrophy in both thighs. Multiple superficial varicosities are present anteriorly within the right thigh. There is diffuse subcutaneous edema throughout both thighs, especially distally. The soft tissues are incompletely visualized due to the patient's body habitus. There is an area of skin ulceration medially in the distal left thigh, best seen on axial image number 98. There is some adjacent soft tissue emphysema. No underlying focal fluid collection or foreign body is seen.  IMPRESSION: 1. Diffuse soft tissue edema within both thighs consistent with cellulitis. There is an area of skin ulceration medially in the distal left thigh with mild adjacent soft tissue emphysema. No focal underlying fluid collection is identified to suggest an abscess. 2. Multiple varicosities are present within the right thigh. 3. No evidence of osteomyelitis. 4. 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   Electronically Signed   By: Camie Patience M.D.   On: 07/27/2014 16:30    Microbiology: Recent Results (from the past 240 hour(s))  WOUND CULTURE     Status: None   Collection Time    07/24/14  3:44 PM      Result Value Ref Range Status   Specimen Description LEG   Final   Special Requests Normal   Final   Gram Stain     Final  Value: RARE SQUAMOUS EPITHELIAL CELLS PRESENT     RARE WBC PRESENT, PREDOMINANTLY PMN     FEW GRAM POSITIVE COCCI IN PAIRS     FEW GRAM NEGATIVE RODS     Performed at [facility]   Culture     Final   Value: FEW STAPHYLOCOCCUS AUREUS     Note: RIFAMPIN AND GENTAMICIN SHOULD NOT BE USED AS SINGLE DRUGS FOR TREATMENT OF STAPH INFECTIONS.     Performed at [facility]   Report Status 07/29/2014 FINAL   Final   Organism ID, Bacteria STAPHYLOCOCCUS AUREUS   Final  CULTURE, BLOOD (ROUTINE X 2)     Status: None   Collection Time    07/27/14 11:15 AM      Result Value Ref Range Status   Specimen Description BLOOD LEFT ARM   Final   Special Requests BOTTLES DRAWN AEROBIC AND ANAEROBIC 10CC EACH   Final   Culture  Setup Time     Final   Value: 07/27/2014 13:54     Performed at [facility]   Culture     Final   Value: NO GROWTH 5 DAYS     Performed at [facility]   Report Status 08/02/2014 FINAL   Final  CULTURE, BLOOD (ROUTINE X 2)     Status: None   Collection Time    07/27/14 11:20 AM      Result Value Ref Range Status   Specimen Description BLOOD RIGHT ARM   Final   Special Requests BOTTLES DRAWN AEROBIC AND ANAEROBIC 10CC EACH   Final   Culture  Setup Time     Final   Value: 07/27/2014 13:54     Performed at [facility]   Culture     Final   Value: NO GROWTH 5 DAYS     Performed at [facility]   Report Status 08/02/2014 FINAL   Final  WOUND CULTURE     Status: None   Collection Time    07/31/14  1:09 PM      Result Value Ref Range Status   Specimen Description WOUND LEFT LEG    Final   Special Requests Normal   Final   Gram Stain     Final   Value: NO WBC SEEN     NO SQUAMOUS EPITHELIAL CELLS SEEN     NO ORGANISMS SEEN     Performed at [facility]   Culture     Final   Value: NO GROWTH 2 DAYS     Performed at [facility]   Report Status 08/03/2014 FINAL   Final     Labs: Basic Metabolic Panel:  Recent Labs Lab 07/30/14 0500 07/31/14 0442 08/01/14 0438 08/02/14 0421 08/03/14 0400  NA 133* 135* 135* 134* 134*  K 4.7 5.5* 5.2 6.1* 4.4  CL 93* 97 97 98 99  CO2 26 29 26 24 26   GLUCOSE 138* 137* 126* 168* 120*  BUN 92* 93* 92* 85* 85*  CREATININE 2.97* 3.00* 2.77* 2.57* 2.60*  CALCIUM 9.0 9.3 9.6 9.5 9.4   Liver Function Tests: No results found for this basename: AST, ALT, ALKPHOS, BILITOT, PROT, ALBUMIN,  in the last 168 hours No results found for this basename: LIPASE, AMYLASE,  in the last 168 hours No results found for this basename: AMMONIA,  in the last 168 hours CBC:  Recent Labs Lab 07/28/14 0448 07/29/14 0435 07/30/14 0500 07/31/14 0442 08/01/14 0438  WBC 6.2  6.4 6.6 6.7 7.0  HGB 9.4* 9.4* 9.3* 9.5* 9.8*  HCT 29.7* 29.1* 28.9* 29.6* 31.1*  MCV 97.4 95.1 96.0 96.4 97.2  PLT 221 240 260 257 278   Cardiac Enzymes: No results found for this basename: CKTOTAL, CKMB, CKMBINDEX, TROPONINI,  in the last 168 hours BNP: BNP (last 3 results) No results found for this basename: PROBNP,  in the last 8760 hours CBG:  Recent Labs Lab 08/02/14 0715 08/02/14 1121 08/02/14 1656 08/02/14 2128 08/03/14 0724  GLUCAP 162* 144* 143* 145* 115*    Time coordinating discharge: 45 minutes  Signed:  Ulyssa Walthour  [facility] 08/03/2014, 11:00 AM

## 2014-08-03 NOTE — Progress Notes (Signed)
  Subjective: Still has the rash all over and the legs are stable but no real change.    Objective: Vital signs in last 24 hours: Temp:  [97.2 F (36.2 C)-97.8 F (36.6 C)] 97.7 F (36.5 C) (08/04 0549) Pulse Rate:  [72-76] 72 (08/04 0549) Resp:  [18] 18 (08/04 0549) BP: (102-116)/(55-77) 106/55 mmHg (08/04 0549) SpO2:  [96 %-98 %] 98 % (08/04 0549) Weight:  [154.5 kg (340 lb 9.8 oz)] 154.5 kg (340 lb 9.8 oz) (08/04 0549) Last BM Date: 08/02/14 Heart healthy/low carb Afebrile, VSS  Intake/Output from previous day: 08/03 0701 - 08/04 0700 In: 723 [P.O.:720; I.V.:3] Out: 4000 [Urine:4000] Intake/Output this shift: Total I/O In: -  Out: 500 [Urine:500]  General appearance: alert, cooperative, no distress and in extremely good spirits despite her current condition. Extremities: Significant lymphedema both legs with open sites and cellulitis.    Lab Results:   Recent Labs  08/01/14 0438  WBC 7.0  HGB 9.8*  HCT 31.1*  PLT 278    BMET  Recent Labs  08/02/14 0421 08/03/14 0400  NA 134* 134*  K 6.1* 4.4  CL 98 99  CO2 24 26  GLUCOSE 168* 120*  BUN 85* 85*  CREATININE 2.57* 2.60*  CALCIUM 9.5 9.4   PT/INR  Recent Labs  08/02/14 0421 08/03/14 0400  LABPROT 23.2* 25.9*  INR 2.06* 2.37*    No results found for this basename: AST, ALT, ALKPHOS, BILITOT, PROT, ALBUMIN,  in the last 168 hours   Lipase  No results found for this basename: lipase     Studies/Results: No results found.  Medications: . amiodarone  400 mg Oral BID  . calcitRIOL  0.25 mcg Oral BID  . calcium carbonate  1 tablet Oral QID   And  . cholecalciferol  200 Units Oral QID  . collagenase   Topical Daily  . febuxostat  80 mg Oral Daily  . gabapentin  100 mg Oral BID  . hydrocortisone cream   Topical BID  . insulin aspart  0-15 Units Subcutaneous TID WC  . insulin glargine  28 Units Subcutaneous QHS  . iron polysaccharides  150 mg Oral BID  . levothyroxine  125 mcg Oral QAC  breakfast  . linagliptin  5 mg Oral Daily  . linezolid  600 mg Oral Q12H  . metronidazole  500 mg Intravenous Q8H  . multivitamin with minerals  1 tablet Oral Daily  . mupirocin ointment   Topical BID  . nitroGLYCERIN  0.4 mg Transdermal QODAY  . saccharomyces boulardii  250 mg Oral BID  . sodium chloride  3 mL Intravenous Q12H  . torsemide  100 mg Oral Daily  . torsemide  50 mg Oral Daily  . Vitamin D (Ergocalciferol)  50,000 Units Oral Once per day on Mon Thu  . warfarin  1 mg Oral ONCE-1800  . Warfarin - Pharmacist Dosing Inpatient   Does not apply q1800    Assessment/Plan CHF  PAF on coumadin  CKD (creatinine stay around 2.0-3.0) Diabetes mellitus  Obesity  LE lymphedema with bacteroides fragilis, LLE wound cellulitis Mild anemia   Plan:  Ongoing medical management.  No further surgery.    LOS: 11 days    [name] 08/03/2014

## 2014-08-03 NOTE — Progress Notes (Signed)
Physical Therapy Hydrotherapy Note   08/03/14 1300  Subjective Assessment  Subjective Pt sitting in bed without dressings on wounds.   Patient and Family Stated Goals healing LE wounds  Prior Treatments bedside debridement  Evaluation and Treatment  Evaluation and Treatment Procedures Explained to Patient/Family Yes  Evaluation and Treatment Procedures agreed to  Wound / Incision (Open or Dehisced) 07/29/14 Other (Comment) Thigh Left;Medial HYDRO  Date First Assessed/Time First Assessed: 07/29/14 1125   Wound Type: (c) Other (Comment)  Location: Thigh  Location Orientation: Left;Medial  Wound Description (Comments): HYDRO  Present on Admission: Yes  Dressing Type Transparent dressing;Moist to dry (santyl)  Dressing Status None  Dressing Change Frequency Twice a day  Site / Wound Assessment Bleeding;Red;Yellow  % Wound base Red or Granulating 30%  % Wound base Yellow 70%  Peri-wound Assessment Erythema (non-blanchable)  Drainage Description Serosanguineous  Non-staged Wound Description Not applicable  Treatment Debridement (Selective);Hydrotherapy (Pulse lavage);Packing (Saline gauze)  Wound / Incision (Open or Dehisced) 08/02/14 Other (Comment) Thigh Right;Medial Hydro, R medial thigh wound  Date First Assessed: 08/02/14   Wound Type: Other (Comment)  Location: Thigh  Location Orientation: Right;Medial  Wound Description (Comments): Hydro, R medial thigh wound  Present on Admission: Yes  Dressing Type Transparent dressing;Gauze (Comment) (santyl)  Dressing Status None  Site / Wound Assessment Yellow  % Wound base Red or Granulating 0%  % Wound base Yellow 100%  Peri-wound Assessment Erythema (non-blanchable)  Drainage Amount Scant (per pt)  Drainage Description Serous  Treatment Debridement (Selective);Hydrotherapy (Pulse lavage);Packing (Saline gauze)  Hydrotherapy  Pulsed Lavage with Suction (psi) 8 psi  Pulsed Lavage with Suction - Normal Saline Used 1000 mL  Pulsed Lavage  Tip Tip with splash shield  Pulsed lavage therapy - wound location L and R medial thigh  Selective Debridement  Selective Debridement - Location L and R medial thigh wound, R wound now with more depth  Selective Debridement - Tools Used Forceps  Selective Debridement - Tissue Removed yellow slough  Wound Therapy - Assess/Plan/Recommendations  Wound Therapy - Clinical Statement Pt would benefit from hydrotherapy to assist with removing necrotic tissue and promote wound healing environment.  Factors Delaying/Impairing Wound Healing Diabetes Mellitus;Immobility;Multiple medical problems  Hydrotherapy Plan Debridement;Dressing change;Patient/family education  Wound Therapy - Frequency 6X / week  Wound Therapy - Follow Up Recommendations Skilled nursing facility  Wound Plan To perform hydrotherapy to L and R medial open thigh wound to remove necrotic tissue and facilitate wound healing.  Wound Therapy Goals - Improve the function of patient's integumentary system by progressing the wound(s) through the phases of wound healing by:  Decrease Necrotic Tissue to 50%  Decrease Necrotic Tissue - Progress Progressing toward goal  Increase Granulation Tissue to 50%  Increase Granulation Tissue - Progress Progressing toward goal  Improve Drainage Characteristics Min  Improve Drainage Characteristics - Progress Progressing toward goal  Goals/treatment plan/discharge plan were made with and agreed upon by patient/family Yes  Time For Goal Achievement 2 weeks  Wound Therapy - Potential for Goals Good  Time: 1478-2956  Carmelia Bake, PT, DPT 08/03/2014 Pager: [phone]

## 2014-08-03 NOTE — Progress Notes (Signed)
Clinical Social Work Department BRIEF PSYCHOSOCIAL ASSESSMENT 08/03/2014  Patient:  Volden,Nile G     Account Number:  [id number]     Admit date:  07/23/2014  Clinical Social Worker:  Renold Genta  Date/Time:  08/03/2014 12:35 PM  Referred by:  RN  Date Referred:  08/03/2014 Referred for  SNF Placement   Other Referral:   Interview type:  Patient Other interview type:    PSYCHOSOCIAL DATA Living Status:  HUSBAND Admitted from facility:   Level of care:   Primary support name:  Deliah Strehlow (husband) ph#: [phone] Primary support relationship to patient:  SPOUSE Degree of support available:   good    CURRENT CONCERNS Current Concerns  Post-Acute Placement   Other Concerns:    SOCIAL WORK ASSESSMENT / PLAN CSW received referral from [facility], Juliann Pulse that patient is now agreeable with plan for SNF.   Assessment/plan status:  Information/Referral to [employer] Other assessment/ plan:   Information/referral to community resources:   CSW completed FL2 and faxed information out to [facility] - provided bed offers.    PATIENT'S/FAMILY'S RESPONSE TO PLAN OF CARE: Patient accepted bed @ [facility], states that she knows that her husband is unable to care for her at this time & needs to go to SNF for the "shortest amount of time needed."       Raynaldo Opitz, [place] Worker cell #: [phone]

## 2014-08-04 LAB — GLUCOSE, CAPILLARY: Glucose-Capillary: 150 mg/dL — ABNORMAL HIGH (ref 70–99)

## 2014-08-05 ENCOUNTER — Encounter: Payer: Medicare Other | Admitting: Cardiovascular Disease

## 2014-08-13 ENCOUNTER — Telehealth: Payer: Self-pay | Admitting: Cardiovascular Disease

## 2014-08-13 DIAGNOSIS — Z79899 Other long term (current) drug therapy: Secondary | ICD-10-CM

## 2014-08-13 NOTE — Telephone Encounter (Signed)
Patient called stating she hasn't had any potassium since D/C from the hospital 08/03/14.  Per D/C summary she was not discharged on any however she should have had a BMP done one week later.  Order placed for STAT BMP and faxed to [facility] Caren Griffins, RN) will have it approved and drawn.  Patient voiced understanding we need the results before K+ can be ordered.

## 2014-08-13 NOTE — Telephone Encounter (Signed)
Ms. Badley is calling because she has not had any potassium since she left [facility] on August the 4th and ios now in [place] and they are stating that they will not give her any unless her Cardiologist approves it .Marland Kitchen Please call her at [phone]. Or call [phone] room [phone] or ask to speak to Lorraina(Nurse) .Marland Kitchen    Thanks

## 2014-08-21 ENCOUNTER — Other Ambulatory Visit: Payer: Self-pay | Admitting: "Endocrinology

## 2014-08-23 ENCOUNTER — Ambulatory Visit: Payer: Medicare Other | Admitting: Cardiology

## 2014-08-23 ENCOUNTER — Telehealth: Payer: Self-pay | Admitting: Pharmacist Clinician (PhC)/ Clinical Pharmacy Specialist

## 2014-08-23 NOTE — Telephone Encounter (Signed)
Pt. Has joined up with [name] and needs to know schedule for checking his coumadin level

## 2014-08-23 NOTE — Telephone Encounter (Signed)
Patient has been admitted to [facility] services and they would like to know when he Coumadin level needs to be checked again?  They are going to be able to check this at the patient's home.

## 2014-08-23 NOTE — Telephone Encounter (Signed)
Asked for INR at next home visit.  Pt recently d/c from [facility]

## 2014-08-24 ENCOUNTER — Ambulatory Visit (INDEPENDENT_AMBULATORY_CARE_PROVIDER_SITE_OTHER): Payer: Medicare Other | Admitting: Pharmacist Clinician (PhC)/ Clinical Pharmacy Specialist

## 2014-08-24 DIAGNOSIS — Z7901 Long term (current) use of anticoagulants: Secondary | ICD-10-CM

## 2014-08-24 LAB — POCT INR: INR: 2

## 2014-08-26 ENCOUNTER — Inpatient Hospital Stay: Payer: Medicare Other | Admitting: Internal Medicine

## 2014-08-26 ENCOUNTER — Telehealth: Payer: Self-pay | Admitting: *Deleted

## 2014-08-26 NOTE — Telephone Encounter (Signed)
Signed verification faxed to continue meds as prescribed to [facility].

## 2014-08-30 ENCOUNTER — Ambulatory Visit: Payer: Self-pay | Admitting: Internal Medicine

## 2014-08-30 ENCOUNTER — Ambulatory Visit (INDEPENDENT_AMBULATORY_CARE_PROVIDER_SITE_OTHER): Payer: Medicare Other | Admitting: Pharmacist Clinician (PhC)/ Clinical Pharmacy Specialist

## 2014-08-30 DIAGNOSIS — Z7901 Long term (current) use of anticoagulants: Secondary | ICD-10-CM

## 2014-08-30 LAB — POCT INR: INR: 1.9

## 2014-09-02 ENCOUNTER — Ambulatory Visit (INDEPENDENT_AMBULATORY_CARE_PROVIDER_SITE_OTHER): Payer: Medicare Other | Admitting: Internal Medicine

## 2014-09-02 ENCOUNTER — Encounter: Payer: Self-pay | Admitting: Internal Medicine

## 2014-09-02 VITALS — BP 100/60 | HR 72 | Ht 67.0 in | Wt 304.0 lb

## 2014-09-02 DIAGNOSIS — N184 Chronic kidney disease, stage 4 (severe): Secondary | ICD-10-CM

## 2014-09-02 DIAGNOSIS — E119 Type 2 diabetes mellitus without complications: Secondary | ICD-10-CM

## 2014-09-02 DIAGNOSIS — Z23 Encounter for immunization: Secondary | ICD-10-CM

## 2014-09-02 DIAGNOSIS — D472 Monoclonal gammopathy: Secondary | ICD-10-CM

## 2014-09-02 DIAGNOSIS — E039 Hypothyroidism, unspecified: Secondary | ICD-10-CM

## 2014-09-02 DIAGNOSIS — L97909 Non-pressure chronic ulcer of unspecified part of unspecified lower leg with unspecified severity: Secondary | ICD-10-CM

## 2014-09-02 MED ORDER — DOXYCYCLINE HYCLATE 100 MG PO CAPS: 100.0000 mg | ORAL_CAPSULE | Freq: Two times a day (BID) | ORAL | Status: AC

## 2014-09-08 ENCOUNTER — Ambulatory Visit (INDEPENDENT_AMBULATORY_CARE_PROVIDER_SITE_OTHER): Payer: Medicare Other | Admitting: Pharmacist Clinician (PhC)/ Clinical Pharmacy Specialist

## 2014-09-08 DIAGNOSIS — Z7901 Long term (current) use of anticoagulants: Secondary | ICD-10-CM

## 2014-09-08 LAB — POCT INR: INR: 2.5

## 2014-09-09 ENCOUNTER — Telehealth: Payer: Self-pay | Admitting: Cardiovascular Disease

## 2014-09-09 ENCOUNTER — Inpatient Hospital Stay (HOSPITAL_COMMUNITY)
Admission: EM | Admit: 2014-09-09 | Discharge: 2014-10-26 | DRG: 871 | Disposition: A | Discharge status: Home Health | Payer: Medicare Other | Attending: Internal Medicine | Admitting: Internal Medicine

## 2014-09-09 ENCOUNTER — Encounter (HOSPITAL_COMMUNITY): Payer: Self-pay | Admitting: Emergency Medicine

## 2014-09-09 DIAGNOSIS — L0390 Cellulitis, unspecified: Secondary | ICD-10-CM | POA: Insufficient documentation

## 2014-09-09 DIAGNOSIS — I340 Nonrheumatic mitral (valve) insufficiency: Secondary | ICD-10-CM | POA: Diagnosis present

## 2014-09-09 DIAGNOSIS — F329 Major depressive disorder, single episode, unspecified: Secondary | ICD-10-CM | POA: Diagnosis present

## 2014-09-09 DIAGNOSIS — L97909 Non-pressure chronic ulcer of unspecified part of unspecified lower leg with unspecified severity: Secondary | ICD-10-CM

## 2014-09-09 DIAGNOSIS — K5641 Fecal impaction: Secondary | ICD-10-CM | POA: Diagnosis not present

## 2014-09-09 DIAGNOSIS — E038 Other specified hypothyroidism: Secondary | ICD-10-CM

## 2014-09-09 DIAGNOSIS — Z9049 Acquired absence of other specified parts of digestive tract: Secondary | ICD-10-CM | POA: Diagnosis not present

## 2014-09-09 DIAGNOSIS — I272 Other secondary pulmonary hypertension: Secondary | ICD-10-CM | POA: Diagnosis present

## 2014-09-09 DIAGNOSIS — N179 Acute kidney failure, unspecified: Secondary | ICD-10-CM | POA: Diagnosis not present

## 2014-09-09 DIAGNOSIS — R531 Weakness: Secondary | ICD-10-CM

## 2014-09-09 DIAGNOSIS — D631 Anemia in chronic kidney disease: Secondary | ICD-10-CM | POA: Diagnosis present

## 2014-09-09 DIAGNOSIS — T45515A Adverse effect of anticoagulants, initial encounter: Secondary | ICD-10-CM | POA: Diagnosis not present

## 2014-09-09 DIAGNOSIS — N390 Urinary tract infection, site not specified: Secondary | ICD-10-CM | POA: Diagnosis not present

## 2014-09-09 DIAGNOSIS — T426X5A Adverse effect of other antiepileptic and sedative-hypnotic drugs, initial encounter: Secondary | ICD-10-CM | POA: Diagnosis not present

## 2014-09-09 DIAGNOSIS — I071 Rheumatic tricuspid insufficiency: Secondary | ICD-10-CM

## 2014-09-09 DIAGNOSIS — A419 Sepsis, unspecified organism: Secondary | ICD-10-CM | POA: Diagnosis not present

## 2014-09-09 DIAGNOSIS — L0291 Cutaneous abscess, unspecified: Secondary | ICD-10-CM

## 2014-09-09 DIAGNOSIS — Z950 Presence of cardiac pacemaker: Secondary | ICD-10-CM

## 2014-09-09 DIAGNOSIS — G4733 Obstructive sleep apnea (adult) (pediatric): Secondary | ICD-10-CM | POA: Diagnosis present

## 2014-09-09 DIAGNOSIS — I5043 Acute on chronic combined systolic (congestive) and diastolic (congestive) heart failure: Secondary | ICD-10-CM | POA: Diagnosis present

## 2014-09-09 DIAGNOSIS — E785 Hyperlipidemia, unspecified: Secondary | ICD-10-CM | POA: Diagnosis present

## 2014-09-09 DIAGNOSIS — R7881 Bacteremia: Secondary | ICD-10-CM

## 2014-09-09 DIAGNOSIS — G629 Polyneuropathy, unspecified: Secondary | ICD-10-CM | POA: Diagnosis present

## 2014-09-09 DIAGNOSIS — L03115 Cellulitis of right lower limb: Secondary | ICD-10-CM | POA: Diagnosis present

## 2014-09-09 DIAGNOSIS — Y92239 Unspecified place in hospital as the place of occurrence of the external cause: Secondary | ICD-10-CM | POA: Diagnosis not present

## 2014-09-09 DIAGNOSIS — L97929 Non-pressure chronic ulcer of unspecified part of left lower leg with unspecified severity: Secondary | ICD-10-CM

## 2014-09-09 DIAGNOSIS — Z515 Encounter for palliative care: Secondary | ICD-10-CM

## 2014-09-09 DIAGNOSIS — I081 Rheumatic disorders of both mitral and tricuspid valves: Secondary | ICD-10-CM | POA: Diagnosis present

## 2014-09-09 DIAGNOSIS — Z833 Family history of diabetes mellitus: Secondary | ICD-10-CM

## 2014-09-09 DIAGNOSIS — Z6841 Body Mass Index (BMI) 40.0 and over, adult: Secondary | ICD-10-CM

## 2014-09-09 DIAGNOSIS — I429 Cardiomyopathy, unspecified: Secondary | ICD-10-CM | POA: Diagnosis present

## 2014-09-09 DIAGNOSIS — K30 Functional dyspepsia: Secondary | ICD-10-CM | POA: Diagnosis not present

## 2014-09-09 DIAGNOSIS — I130 Hypertensive heart and chronic kidney disease with heart failure and stage 1 through stage 4 chronic kidney disease, or unspecified chronic kidney disease: Secondary | ICD-10-CM | POA: Diagnosis present

## 2014-09-09 DIAGNOSIS — K5900 Constipation, unspecified: Secondary | ICD-10-CM

## 2014-09-09 DIAGNOSIS — Z905 Acquired absence of kidney: Secondary | ICD-10-CM

## 2014-09-09 DIAGNOSIS — E119 Type 2 diabetes mellitus without complications: Secondary | ICD-10-CM | POA: Diagnosis present

## 2014-09-09 DIAGNOSIS — I959 Hypotension, unspecified: Secondary | ICD-10-CM

## 2014-09-09 DIAGNOSIS — L97919 Non-pressure chronic ulcer of unspecified part of right lower leg with unspecified severity: Secondary | ICD-10-CM

## 2014-09-09 DIAGNOSIS — E063 Autoimmune thyroiditis: Secondary | ICD-10-CM | POA: Diagnosis present

## 2014-09-09 DIAGNOSIS — B488 Other specified mycoses: Secondary | ICD-10-CM | POA: Diagnosis not present

## 2014-09-09 DIAGNOSIS — Z7189 Other specified counseling: Secondary | ICD-10-CM

## 2014-09-09 DIAGNOSIS — E1122 Type 2 diabetes mellitus with diabetic chronic kidney disease: Secondary | ICD-10-CM | POA: Diagnosis present

## 2014-09-09 DIAGNOSIS — Z789 Other specified health status: Secondary | ICD-10-CM

## 2014-09-09 DIAGNOSIS — T367X5A Adverse effect of antifungal antibiotics, systemically used, initial encounter: Secondary | ICD-10-CM | POA: Diagnosis not present

## 2014-09-09 DIAGNOSIS — E1165 Type 2 diabetes mellitus with hyperglycemia: Secondary | ICD-10-CM | POA: Diagnosis present

## 2014-09-09 DIAGNOSIS — E871 Hypo-osmolality and hyponatremia: Secondary | ICD-10-CM | POA: Diagnosis not present

## 2014-09-09 DIAGNOSIS — L97921 Non-pressure chronic ulcer of unspecified part of left lower leg limited to breakdown of skin: Secondary | ICD-10-CM

## 2014-09-09 DIAGNOSIS — Y92009 Unspecified place in unspecified non-institutional (private) residence as the place of occurrence of the external cause: Secondary | ICD-10-CM

## 2014-09-09 DIAGNOSIS — M109 Gout, unspecified: Secondary | ICD-10-CM | POA: Diagnosis present

## 2014-09-09 DIAGNOSIS — M797 Fibromyalgia: Secondary | ICD-10-CM | POA: Diagnosis present

## 2014-09-09 DIAGNOSIS — Z8619 Personal history of other infectious and parasitic diseases: Secondary | ICD-10-CM | POA: Diagnosis not present

## 2014-09-09 DIAGNOSIS — I872 Venous insufficiency (chronic) (peripheral): Secondary | ICD-10-CM | POA: Diagnosis present

## 2014-09-09 DIAGNOSIS — L89153 Pressure ulcer of sacral region, stage 3: Secondary | ICD-10-CM | POA: Diagnosis present

## 2014-09-09 DIAGNOSIS — Q600 Renal agenesis, unilateral: Secondary | ICD-10-CM | POA: Diagnosis not present

## 2014-09-09 DIAGNOSIS — Z9181 History of falling: Secondary | ICD-10-CM | POA: Diagnosis not present

## 2014-09-09 DIAGNOSIS — L089 Local infection of the skin and subcutaneous tissue, unspecified: Secondary | ICD-10-CM

## 2014-09-09 DIAGNOSIS — L03119 Cellulitis of unspecified part of limb: Secondary | ICD-10-CM | POA: Diagnosis present

## 2014-09-09 DIAGNOSIS — Z888 Allergy status to other drugs, medicaments and biological substances status: Secondary | ICD-10-CM | POA: Diagnosis not present

## 2014-09-09 DIAGNOSIS — N189 Chronic kidney disease, unspecified: Secondary | ICD-10-CM

## 2014-09-09 DIAGNOSIS — I2781 Cor pulmonale (chronic): Secondary | ICD-10-CM | POA: Diagnosis not present

## 2014-09-09 DIAGNOSIS — I480 Paroxysmal atrial fibrillation: Secondary | ICD-10-CM | POA: Diagnosis present

## 2014-09-09 DIAGNOSIS — L97911 Non-pressure chronic ulcer of unspecified part of right lower leg limited to breakdown of skin: Secondary | ICD-10-CM

## 2014-09-09 DIAGNOSIS — E039 Hypothyroidism, unspecified: Secondary | ICD-10-CM | POA: Diagnosis present

## 2014-09-09 DIAGNOSIS — Z66 Do not resuscitate: Secondary | ICD-10-CM | POA: Diagnosis not present

## 2014-09-09 DIAGNOSIS — R791 Abnormal coagulation profile: Secondary | ICD-10-CM | POA: Diagnosis not present

## 2014-09-09 DIAGNOSIS — E872 Acidosis: Secondary | ICD-10-CM | POA: Diagnosis present

## 2014-09-09 DIAGNOSIS — R6521 Severe sepsis with septic shock: Secondary | ICD-10-CM | POA: Diagnosis present

## 2014-09-09 DIAGNOSIS — N2581 Secondary hyperparathyroidism of renal origin: Secondary | ICD-10-CM | POA: Diagnosis present

## 2014-09-09 DIAGNOSIS — R600 Localized edema: Secondary | ICD-10-CM | POA: Diagnosis present

## 2014-09-09 DIAGNOSIS — N184 Chronic kidney disease, stage 4 (severe): Secondary | ICD-10-CM | POA: Diagnosis present

## 2014-09-09 DIAGNOSIS — R0602 Shortness of breath: Secondary | ICD-10-CM

## 2014-09-09 DIAGNOSIS — L03116 Cellulitis of left lower limb: Secondary | ICD-10-CM | POA: Diagnosis present

## 2014-09-09 DIAGNOSIS — Z7901 Long term (current) use of anticoagulants: Secondary | ICD-10-CM | POA: Diagnosis not present

## 2014-09-09 DIAGNOSIS — E8809 Other disorders of plasma-protein metabolism, not elsewhere classified: Secondary | ICD-10-CM | POA: Diagnosis not present

## 2014-09-09 DIAGNOSIS — E876 Hypokalemia: Secondary | ICD-10-CM | POA: Diagnosis not present

## 2014-09-09 DIAGNOSIS — I10 Essential (primary) hypertension: Secondary | ICD-10-CM

## 2014-09-09 DIAGNOSIS — I5042 Chronic combined systolic (congestive) and diastolic (congestive) heart failure: Secondary | ICD-10-CM | POA: Diagnosis present

## 2014-09-09 DIAGNOSIS — I4819 Other persistent atrial fibrillation: Secondary | ICD-10-CM

## 2014-09-09 DIAGNOSIS — K7200 Acute and subacute hepatic failure without coma: Secondary | ICD-10-CM | POA: Diagnosis not present

## 2014-09-09 DIAGNOSIS — A4159 Other Gram-negative sepsis: Principal | ICD-10-CM | POA: Diagnosis present

## 2014-09-09 DIAGNOSIS — G252 Other specified forms of tremor: Secondary | ICD-10-CM | POA: Diagnosis not present

## 2014-09-09 DIAGNOSIS — E662 Morbid (severe) obesity with alveolar hypoventilation: Secondary | ICD-10-CM | POA: Diagnosis present

## 2014-09-09 DIAGNOSIS — W19XXXA Unspecified fall, initial encounter: Secondary | ICD-10-CM

## 2014-09-09 DIAGNOSIS — I9589 Other hypotension: Secondary | ICD-10-CM

## 2014-09-09 DIAGNOSIS — Z880 Allergy status to penicillin: Secondary | ICD-10-CM

## 2014-09-09 DIAGNOSIS — Z794 Long term (current) use of insulin: Secondary | ICD-10-CM | POA: Diagnosis not present

## 2014-09-09 DIAGNOSIS — Z95810 Presence of automatic (implantable) cardiac defibrillator: Secondary | ICD-10-CM | POA: Diagnosis not present

## 2014-09-09 DIAGNOSIS — I428 Other cardiomyopathies: Secondary | ICD-10-CM | POA: Diagnosis present

## 2014-09-09 DIAGNOSIS — I2789 Other specified pulmonary heart diseases: Secondary | ICD-10-CM | POA: Diagnosis present

## 2014-09-09 LAB — CBC WITH DIFFERENTIAL/PLATELET
BASOS ABS: 0 10*3/uL (ref 0.0–0.1)
Basophils Relative: 0 % (ref 0–1)
Eosinophils Absolute: 0 10*3/uL (ref 0.0–0.7)
Eosinophils Relative: 0 % (ref 0–5)
HEMATOCRIT: 29.5 % — AB (ref 36.0–46.0)
Hemoglobin: 9.6 g/dL — ABNORMAL LOW (ref 12.0–15.0)
LYMPHS PCT: 1 % — AB (ref 12–46)
Lymphs Abs: 0.2 10*3/uL — ABNORMAL LOW (ref 0.7–4.0)
MCH: 31.9 pg (ref 26.0–34.0)
MCHC: 32.5 g/dL (ref 30.0–36.0)
MCV: 98 fL (ref 78.0–100.0)
Monocytes Absolute: 0.4 10*3/uL (ref 0.1–1.0)
Monocytes Relative: 2 % — ABNORMAL LOW (ref 3–12)
Neutro Abs: 18.4 10*3/uL — ABNORMAL HIGH (ref 1.7–7.7)
Neutrophils Relative %: 97 % — ABNORMAL HIGH (ref 43–77)
Platelets: 251 10*3/uL (ref 150–400)
RBC: 3.01 MIL/uL — ABNORMAL LOW (ref 3.87–5.11)
RDW: 19.7 % — AB (ref 11.5–15.5)
WBC: 19 10*3/uL — ABNORMAL HIGH (ref 4.0–10.5)

## 2014-09-09 LAB — GLUCOSE, CAPILLARY: Glucose-Capillary: 155 mg/dL — ABNORMAL HIGH (ref 70–99)

## 2014-09-09 LAB — PROTIME-INR
INR: 3.4 — ABNORMAL HIGH (ref 0.00–1.49)
PROTHROMBIN TIME: 34.3 s — AB (ref 11.6–15.2)

## 2014-09-09 LAB — I-STAT CG4 LACTIC ACID, ED: Lactic Acid, Venous: 3.16 mmol/L — ABNORMAL HIGH (ref 0.5–2.2)

## 2014-09-09 LAB — BASIC METABOLIC PANEL
ANION GAP: 16 — AB (ref 5–15)
BUN: 74 mg/dL — ABNORMAL HIGH (ref 6–23)
CO2: 25 mEq/L (ref 19–32)
Calcium: 8.8 mg/dL (ref 8.4–10.5)
Chloride: 95 mEq/L — ABNORMAL LOW (ref 96–112)
Creatinine, Ser: 3.38 mg/dL — ABNORMAL HIGH (ref 0.50–1.10)
GFR calc Af Amer: 15 mL/min — ABNORMAL LOW (ref >90)
GFR calc non Af Amer: 13 mL/min — ABNORMAL LOW (ref >90)
Glucose, Bld: 170 mg/dL — ABNORMAL HIGH (ref 70–99)
Potassium: 4.9 mEq/L (ref 3.7–5.3)
Sodium: 136 mEq/L — ABNORMAL LOW (ref 137–147)

## 2014-09-09 MED ORDER — CALCIUM CARBONATE-VITAMIN D 500-200 MG-UNIT PO TABS
1.0000 | ORAL_TABLET | Freq: Two times a day (BID) | ORAL | Status: DC
  Administered 2014-09-11 – 2014-10-08 (×17): 1 via ORAL
  Filled 2014-09-09 (×95): qty 1

## 2014-09-09 MED ORDER — LEVOTHYROXINE SODIUM 125 MCG PO TABS
125.0000 ug | ORAL_TABLET | Freq: Every day | ORAL | Status: DC
  Administered 2014-09-10 – 2014-10-26 (×47): 125 ug via ORAL
  Filled 2014-09-09 (×48): qty 1

## 2014-09-09 MED ORDER — SODIUM CHLORIDE 0.9 % IV SOLN
250.0000 mL | INTRAVENOUS | Status: DC | PRN
Start: 2014-09-09 — End: 2014-09-27
  Administered 2014-09-11: 250 mL via INTRAVENOUS

## 2014-09-09 MED ORDER — SODIUM CHLORIDE 0.9 % IJ SOLN
3.0000 mL | Freq: Two times a day (BID) | INTRAMUSCULAR | Status: DC
  Administered 2014-09-10: 3 mL via INTRAVENOUS

## 2014-09-09 MED ORDER — VANCOMYCIN HCL 10 G IV SOLR
1750.0000 mg | INTRAVENOUS | Status: DC
  Administered 2014-09-11: 1750 mg via INTRAVENOUS
  Filled 2014-09-09 (×2): qty 1750

## 2014-09-09 MED ORDER — SODIUM CHLORIDE 0.9 % IJ SOLN: 3.0000 mL | INTRAMUSCULAR | Status: DC | PRN

## 2014-09-09 MED ORDER — METRONIDAZOLE IN NACL 5-0.79 MG/ML-% IV SOLN
500.0000 mg | Freq: Three times a day (TID) | INTRAVENOUS | Status: DC
  Administered 2014-09-10 – 2014-09-11 (×5): 500 mg via INTRAVENOUS
  Filled 2014-09-09 (×10): qty 100

## 2014-09-09 MED ORDER — VANCOMYCIN HCL 10 G IV SOLR
1500.0000 mg | Freq: Once | INTRAVENOUS | Status: AC
  Administered 2014-09-10: 1500 mg via INTRAVENOUS
  Filled 2014-09-09: qty 1500

## 2014-09-09 MED ORDER — ALBUTEROL SULFATE (2.5 MG/3ML) 0.083% IN NEBU: 2.5000 mg | INHALATION_SOLUTION | Freq: Four times a day (QID) | RESPIRATORY_TRACT | Status: DC | PRN

## 2014-09-09 MED ORDER — MUPIROCIN 2 % EX OINT
TOPICAL_OINTMENT | Freq: Two times a day (BID) | CUTANEOUS | Status: DC
  Administered 2014-09-10 – 2014-09-11 (×2): via TOPICAL
  Administered 2014-09-11: 1 via TOPICAL
  Administered 2014-09-12 – 2014-09-19 (×12): via TOPICAL
  Filled 2014-09-09: qty 22

## 2014-09-09 MED ORDER — INSULIN GLARGINE 100 UNIT/ML ~~LOC~~ SOLN
20.0000 [IU] | Freq: Every day | SUBCUTANEOUS | Status: DC
  Administered 2014-09-09: 20 [IU] via SUBCUTANEOUS
  Filled 2014-09-09 (×2): qty 0.2

## 2014-09-09 MED ORDER — HYDROMORPHONE HCL PF 1 MG/ML IJ SOLN
1.0000 mg | Freq: Once | INTRAMUSCULAR | Status: AC
  Administered 2014-09-09: 1 mg via INTRAVENOUS
  Filled 2014-09-09: qty 1

## 2014-09-09 MED ORDER — HYDROXYZINE HCL 25 MG PO TABS
25.0000 mg | ORAL_TABLET | ORAL | Status: DC | PRN
  Administered 2014-09-17 – 2014-09-18 (×2): 25 mg via ORAL
  Filled 2014-09-09 (×3): qty 1

## 2014-09-09 MED ORDER — ALBUTEROL SULFATE HFA 108 (90 BASE) MCG/ACT IN AERS: 2.0000 | INHALATION_SPRAY | Freq: Four times a day (QID) | RESPIRATORY_TRACT | Status: DC | PRN

## 2014-09-09 MED ORDER — INSULIN ASPART 100 UNIT/ML ~~LOC~~ SOLN
0.0000 [IU] | Freq: Three times a day (TID) | SUBCUTANEOUS | Status: DC
Start: 2014-09-10 — End: 2014-09-10
  Administered 2014-09-10: 3 [IU] via SUBCUTANEOUS

## 2014-09-09 MED ORDER — ONE-DAILY MULTI VITAMINS PO TABS: 1.0000 | ORAL_TABLET | Freq: Every day | ORAL | Status: DC

## 2014-09-09 MED ORDER — SODIUM CHLORIDE 0.9 % IJ SOLN
3.0000 mL | Freq: Two times a day (BID) | INTRAMUSCULAR | Status: DC
  Administered 2014-09-10 – 2014-10-25 (×45): 3 mL via INTRAVENOUS

## 2014-09-09 MED ORDER — ADULT MULTIVITAMIN W/MINERALS CH
1.0000 | ORAL_TABLET | Freq: Every day | ORAL | Status: DC
  Administered 2014-09-10 – 2014-10-03 (×5): 1 via ORAL
  Filled 2014-09-09 (×47): qty 1

## 2014-09-09 MED ORDER — CALCITRIOL 0.25 MCG PO CAPS
0.2500 ug | ORAL_CAPSULE | Freq: Two times a day (BID) | ORAL | Status: DC
  Administered 2014-09-09 – 2014-10-26 (×90): 0.25 ug via ORAL
  Filled 2014-09-09 (×98): qty 1

## 2014-09-09 MED ORDER — ONDANSETRON HCL 4 MG/2ML IJ SOLN
4.0000 mg | Freq: Once | INTRAMUSCULAR | Status: AC
  Administered 2014-09-09: 4 mg via INTRAVENOUS
  Filled 2014-09-09: qty 2

## 2014-09-09 MED ORDER — POLYSACCHARIDE IRON COMPLEX 150 MG PO CAPS
150.0000 mg | ORAL_CAPSULE | Freq: Two times a day (BID) | ORAL | Status: DC
  Administered 2014-09-11 – 2014-10-20 (×40): 150 mg via ORAL
  Filled 2014-09-09 (×97): qty 1

## 2014-09-09 MED ORDER — CALCIUM CITRATE-VITAMIN D 200-200 MG-UNIT PO TABS: 1.0000 | ORAL_TABLET | Freq: Four times a day (QID) | ORAL | Status: DC

## 2014-09-09 MED ORDER — FEBUXOSTAT 40 MG PO TABS
80.0000 mg | ORAL_TABLET | Freq: Every day | ORAL | Status: DC
  Administered 2014-09-10 – 2014-10-26 (×47): 80 mg via ORAL
  Filled 2014-09-09 (×47): qty 2

## 2014-09-09 MED ORDER — VANCOMYCIN HCL IN DEXTROSE 1-5 GM/200ML-% IV SOLN
1000.0000 mg | Freq: Once | INTRAVENOUS | Status: AC
  Administered 2014-09-09: 1000 mg via INTRAVENOUS
  Filled 2014-09-09: qty 200

## 2014-09-09 MED ORDER — AMIODARONE HCL 200 MG PO TABS
400.0000 mg | ORAL_TABLET | Freq: Two times a day (BID) | ORAL | Status: DC
  Administered 2014-09-09 – 2014-09-22 (×27): 400 mg via ORAL
  Filled 2014-09-09 (×29): qty 2

## 2014-09-09 MED ORDER — HYDROCODONE-ACETAMINOPHEN 10-325 MG PO TABS
1.0000 | ORAL_TABLET | ORAL | Status: DC | PRN
  Administered 2014-09-10 (×3): 1 via ORAL
  Filled 2014-09-09 (×3): qty 1

## 2014-09-09 MED ORDER — NITROGLYCERIN 0.4 MG SL SUBL: 0.4000 mg | SUBLINGUAL_TABLET | SUBLINGUAL | Status: DC | PRN

## 2014-09-09 MED ORDER — SACCHAROMYCES BOULARDII 250 MG PO CAPS
250.0000 mg | ORAL_CAPSULE | Freq: Two times a day (BID) | ORAL | Status: DC
  Administered 2014-09-11 – 2014-10-20 (×62): 250 mg via ORAL
  Filled 2014-09-09 (×97): qty 1

## 2014-09-09 MED ORDER — WARFARIN - PHARMACIST DOSING INPATIENT
Freq: Every day | Status: DC
  Administered 2014-09-19 – 2014-10-22 (×6)

## 2014-09-09 MED ORDER — METOPROLOL TARTRATE 12.5 MG HALF TABLET
12.5000 mg | ORAL_TABLET | Freq: Two times a day (BID) | ORAL | Status: DC
  Administered 2014-09-10: 12.5 mg via ORAL
  Filled 2014-09-09 (×3): qty 1

## 2014-09-09 NOTE — H&P (Signed)
[facility] History and Physical  [name] ZOX:[id number] DOB: 08/29/1950 DOA: 09/09/2014  Referring physician: ED physician PCP: Elby Showers, MD  Specialists:   Chief Complaint:   HPI: [name] is a 64 y.o. female  with history of fibromyalgia, hypothyroidism, solitary kidney, hypothyroidism, diabetes, nonischemic cardiomyopathy with ejection fraction of 35%, atrial fibrillation who presents the emergency department today with left-sided lower leg pain.  This is a recurrent issue. Patient was recently hospitalized and treated for leg cellulitis in [facility]. Then she had a rehabilitation for 2 weeks in early August. After that, she went home and has been doing okay until 5 days ago when patient started having worsening left leg pain. She has chronic leg edema secondary to congestive heart failure. She uses antibiotic cream intermittently for leg infection. In the past 5 days, her leg swelling is getting worse, and become more painful. She also had a fever with temperature 103. She had a flu shot on 09/03/14, which may have contributed to her fever per patient. She took oral doxycycline without significant help. Because of the worsening leg pain, she comes to the emergency room for further evaluation and treatment.   Review of Systems: As presented in the history of presenting illness, rest negative.  Where does patient live?  Lives with her husband at home in [place] Can patient participate in ADLs? Barely  Allergy:  Allergies  Allergen Reactions  . Ivp Dye [Iodinated Diagnostic Agents] Shortness Of Breath    Turn red, can't breathe  . Betadine [Povidone Iodine]   . Diphenhydramine Hcl Swelling    In hands and eyes  . Fish Allergy Nausea And Vomiting  . Iohexol      Desc: PT TURNS RED AND WHEEZING   . Penicillins Other (See Comments)    Resp arrest as child  . Povidone-Iodine Other (See Comments)    Wheezing, turn red, can't breath, and blisters  .  Promethazine Hcl Other (See Comments)    Low Blood Pressure  . Tape     Blisters, can use paper tape for short periods  . Clindamycin Hives and Rash    wheezing  . Iodine Rash  . Morphine Sulfate Nausea And Vomiting and Rash  . Primaxin [Imipenem] Rash    Past Medical History  Diagnosis Date  . Personal history of other diseases of circulatory system   . 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 Chronic lymphocytic thyroiditis   . Morbid obesity   . Other chronic pulmonary heart diseases   . Obstructive sleep apnea (adult) (pediatric)   . Other and unspecified hyperlipidemia   . Fibromyalgia   . Goiter   . Thyroiditis, autoimmune   . Hypothyroidism, acquired, autoimmune   . Solitary kidney, acquired   . Fatigue   . Hyperparathyroidism, secondary   . Vitamin D deficiency disease   . Hyperparathyroidism   . H/O varicose veins 03/27/12  . Type II or unspecified type diabetes mellitus without mention of complication, not stated as uncontrolled   . Diabetes mellitus type II   . Infections of kidney 03/27/12  . History of measles, mumps, or rubella 03/27/12  . Tubal pregnancy 12/23/76  . Unspecified essential hypertension     20 yrs ago  . Blood transfusion 1977    [facility]  . Complication of anesthesia 2010    hard to wake up after knee surgery  . Anemia   . Cardiomyopathy, nonischemic     EF 45 %  .  Endometrial hyperplasia with atypia 05/05/12  . Diastolic HF (heart failure) 11/05/2012  . A-fib   . Echocardiogram abnormal 07/15/12    severe MR,mod to severe TR,mod to severe PA hypertension  . Pulmonary arterial hypertension   . PAF (paroxysmal atrial fibrillation), 1st episode since DCCV in Jan/14 03/19/2013  . Ulcers of both lower extremities, small, now with cellulitis 06/05/2013    Past Surgical History  Procedure Laterality Date  . Cholecystectomy    . Pacemaker placement  02/26/11    Medtronic Adapta  . Knee surgery    . Resection duplicate left kidney     . 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 Hernia repair  1999    ruptured ;  . D& c with hysteroscopy & cervical polypectomy  05/05/12  . Cardioversion N/A 02/06/2013    Successful  . Iud removal  01/30/13  . Ectopic pregnancy surgery  1978  . Cardioversion N/A 06/08/2013    Procedure: CARDIOVERSION;  Surgeon: Sanda Klein, MD;  Location: [place];  Service: Cardiovascular;  Laterality: N/A;  Room [phone]  . Cardioversion N/A 02/01/2014    Procedure: CARDIOVERSION;  Surgeon: Sanda Klein, MD;  Location: [place];  Service: Cardiovascular;  Laterality: N/A;  . Cardioversion N/A 06/02/2014    Procedure: CARDIOVERSION;  Surgeon: Sanda Klein, MD;  Location: [facility];  Service: Cardiovascular;  Laterality: N/A;    Social History:  reports that she has never smoked. She has never used smokeless tobacco. She reports that she does not drink alcohol or use illicit drugs.  Family History:  Family History  Problem Relation Age of Onset  . Cardiomyopathy Brother   . Diabetes Mother   . Cancer Maternal Grandmother   . Heart attack Mother      Prior to Admission medications   Medication Sig Start Date End Date Taking? Authorizing Provider  albuterol (PROVENTIL HFA;VENTOLIN HFA) 108 (90 BASE) MCG/ACT inhaler Inhale 2 puffs into the lungs every 6 (six) hours as needed for wheezing or shortness of breath.    Yes Historical Provider, MD  albuterol (PROVENTIL) (2.5 MG/3ML) 0.083% nebulizer solution Take 2.5 mg by nebulization every 6 (six) hours as needed for wheezing or shortness of breath.    Yes Historical Provider, MD  amiodarone (PACERONE) 200 MG tablet Take 2 tablets (400 mg total) by mouth 2 (two) times daily. 07/08/14  Yes Mihai Croitoru, MD  calcitRIOL (ROCALTROL) 0.25 MCG capsule Take 0.25 mcg by mouth 2 (two) times daily.   Yes Historical Provider, MD  calcium citrate-vitamin D 200-200 MG-UNIT TABS Take 1 tablet by mouth 4 (four) times daily.    Yes Historical Provider, MD   camphor-menthol Timoteo Ace) lotion Apply 1 application topically daily as needed for itching. 08/03/14  Yes Janece Canterbury, MD  collagenase (SANTYL) ointment Apply 1 application topically daily. Bilateral medial thigh ulcers. 08/03/14  Yes Janece Canterbury, MD  doxycycline (VIBRAMYCIN) 100 MG capsule Take 1 capsule (100 mg total) by mouth 2 (two) times daily. 09/02/14  Yes Elby Showers, MD  glyBURIDE (DIABETA) 5 MG tablet TAKE 1-2 TABLETS TWICE DAILY WITH A MEAL. TAKE 10MG  (2 TABS) IN THE A.M. AND 5MG (1 TAB) AT NIGHT. 07/01/14  Yes Sherrlyn Hock, MD  HYDROcodone-acetaminophen [facility]) 10-325 MG per tablet Take 1 tablet by mouth every 4 (four) hours as needed for moderate pain.  06/29/14  Yes Historical Provider, MD  hydrOXYzine (ATARAX/VISTARIL) 25 MG tablet Take 1 tablet (25 mg total) by mouth every 4 (four) hours as  needed for itching. 08/03/14  Yes Janece Canterbury, MD  insulin glargine (LANTUS) 100 UNIT/ML injection Inject 27 Units into the skin at bedtime.  07/03/13  Yes Sherrlyn Hock, MD  iron polysaccharides (NIFEREX) 150 MG capsule Take 150 mg by mouth 2 (two) times daily.   Yes Historical Provider, MD  levothyroxine (SYNTHROID, LEVOTHROID) 125 MCG tablet Take 1 tablet (125 mcg total) by mouth daily before breakfast. 06/02/14  Yes Mihai Croitoru, MD  metolazone (ZAROXOLYN) 5 MG tablet Take 2.5 mg by mouth daily as needed (for weight gain). If up 4-5 lbs. 03/12/14  Yes Mihai Croitoru, MD  metoprolol tartrate (LOPRESSOR) 25 MG tablet Take 12.5 mg by mouth 2 (two) times daily. 08/03/14  Yes Janece Canterbury, MD  Multiple Vitamin (MULTIVITAMIN) tablet Take 1 tablet by mouth daily.    Yes Historical Provider, MD  mupirocin ointment (BACTROBAN) 2 % Apply topically 2 (two) times daily. Apply to lower extremity ulcers not being treated with hydrotherapy 08/03/14  Yes Janece Canterbury, MD  nitroGLYCERIN (NITROSTAT) 0.4 MG SL tablet Place 0.4 mg under the tongue every 5 (five) minutes as needed. For chest pain   Yes  Historical Provider, MD  saccharomyces boulardii (FLORASTOR) 250 MG capsule Take 250 mg by mouth 2 (two) times daily. 08/03/14  Yes Janece Canterbury, MD  sitaGLIPtin (JANUVIA) 100 MG tablet Take 100 mg by mouth daily.   Yes Historical Provider, MD  torsemide (DEMADEX) 100 MG tablet Take 50-100 mg by mouth 2 (two) times daily. 100 mg in the am and 50 mg in the pm 03/08/14  Yes Mihai Croitoru, MD  ULORIC 40 MG tablet Take 80 mg by mouth daily. Take two tablet 05/21/14  Yes Historical Provider, MD  Vitamin D, Ergocalciferol, (DRISDOL) 50000 UNITS CAPS capsule Take 50,000 Units by mouth 2 (two) times a week. Takes on Tuesdays and Fridays 03/09/14 03/10/15 Yes Sherrlyn Hock, MD  VOLTAREN 1 % GEL Apply 2 g topically 4 (four) times daily as needed (for pain).  03/11/14  Yes Historical Provider, MD  warfarin (COUMADIN) 5 MG tablet Take 2.5-5 mg by mouth See admin instructions. Takes 5 mg on Sunday, Tuesday, Thursday and Saturday and 2.5 mg all other days 01/20/14  Yes Tommy Medal, RPH-CPP  BD PEN NEEDLE NANO U/F 32G X 4 MM MISC USE AS DIRECTED 08/23/14   Sherrlyn Hock, MD  glucose blood (ONE TOUCH ULTRA TEST) test strip Test 3 times daily 03/09/14   Sherrlyn Hock, MD  Insulin Pen Needle (B-D UF III MINI PEN NEEDLES) 31G X 5 MM MISC Check blood sugar up to 6x daily 08/11/13   Sherrlyn Hock, MD    Physical Exam: Filed Vitals:   09/09/14 1900 09/09/14 1930 09/09/14 2003 09/09/14 2004  BP: 70/50 79/45 80/51  80/51  Pulse: 83 103 99 95  Temp:      TempSrc:      Resp: 15 20 20 20   SpO2: 97% 96% 97% 96%   General: Not in acute distress HEENT:       Eyes: PERRL, EOMI, no scleral icterus       ENT: No discharge from the ears and nose, no pharynx injection, no tonsillar enlargement.        Neck: No JVD, no bruit, no mass felt. Cardiac: S1/S2, irregularly irregular rhythm, No murmurs, gallops or rubs Pulm: Good air movement bilaterally. Clear to auscultation bilaterally. No rales, wheezing, rhonchi  or rubs. Abd: Soft, nondistended, nontender, no rebound pain, no organomegaly, BS present Ext:  B/L Lower Ext shows chronic edema. There are numerous small ulcerations on both legs. Open wound in both inner thighs that are currently packed and not draining. The right lower leg is very tender, warm, erythematous, and significantly swelling.  2+DP/PT pulse bilaterally.  Neurology: Awake alert, and oriented X 3, CN II-XII intact, Non focal Musculoskeletal: No joint deformities, or stiffness, ROM full Skin: No rashes.  Neuro: Alert and oriented X3, cranial nerves II-XII grossly intact, muscle strength 5/5 in all extremeties, sensation to light touch intact.  Psych: Patient is not psychotic, no suicidal or hemocidal ideation.  Labs on Admission:  Basic Metabolic Panel:  Recent Labs Lab 09/09/14 1820  NA 136*  K 4.9  CL 95*  CO2 25  GLUCOSE 170*  BUN 74*  CREATININE 3.38*  CALCIUM 8.8   Liver Function Tests: No results found for this basename: AST, ALT, ALKPHOS, BILITOT, PROT, ALBUMIN,  in the last 168 hours No results found for this basename: LIPASE, AMYLASE,  in the last 168 hours No results found for this basename: AMMONIA,  in the last 168 hours CBC:  Recent Labs Lab 09/09/14 1820  WBC 19.0*  NEUTROABS 18.4*  HGB 9.6*  HCT 29.5*  MCV 98.0  PLT 251   Cardiac Enzymes: No results found for this basename: CKTOTAL, CKMB, CKMBINDEX, TROPONINI,  in the last 168 hours  BNP (last 3 results) No results found for this basename: PROBNP,  in the last 8760 hours CBG: No results found for this basename: GLUCAP,  in the last 168 hours  Radiological Exams on Admission: No results found.  EKG: Independently reviewed. Sinus rhythm, regular, normal axis, normal R wave progression, normal QT interval, No ischemic change in T waves or ST segments.  Assessment/Plan Principal Problem:   Cellulitis of lower leg Active Problems:   HYPERTENSION   PACEMAKER, PERMANENT   Type 2 diabetes  mellitus   Chronic combined systolic and diastolic HF (heart failure)   Long term (current) use of anticoagulants   CKD (chronic kidney disease) stage 4, GFR 15-29 ml/min   Cellulitis   Left leg cellulitis  . Left lower leg cellulitis:  - Admit to a telemetry unit given multiple cardiac issues - Start on empiric vancomycin and Flagyl (patient is allergic to penicillin, not good candidate for Zosyn)  -  Keep lower extremity elevated. -  follow up blood culture X 2 - Wound care consult - pt/ot  . Type 2 diabetes mellitus: Well controlled, recent A1c 7.0 on 03/05/17.  - decrease home dose of Lantus from 27 to 20 due to poor oral intake - SSI while inpatient.  - Follow CBGs   . PAF (paroxysmal atrial fibrillation) - Currently with a paced rhythm, monitor in telemetry.  - Coumadin to be dosed by pharmacy.  - Continue home medications  . Combined systolic and diastolic congestive heart failure- EF 30-35% June 2014 - Clinically compensated.  - will hold Demadex tonight given that patient is at risk of sepsis and her bp is soft on admission, also her Cre is higher than baseline. Carefully monitor volume status. May restart in the AM.  - Strict I&O's, daily weights.  . Hypothyroidism, acquired, autoimmune: Recent TSH was 2.12 on 05/28/14. - Continue levothyroxine   Gout: stable -Continue home medication  . CKD (chronic kidney disease) stage 4, GFR 15-29 ml/min: Cre is slightly higher from baseline 2.5-3.00 to 3.38 on admission - Monitor closely - hold diuretics as above.  Marland Kitchen PACEMAKER, PERMANENT - Monitor in telemetry.   Marland Kitchen  Obesity hypoventilation syndrome - CPAP each bedtime  . OBESITY, MORBID  DVT ppx: SQ Heparin     Code Status: Full code Family Communication:  Yes, patient's husband at bed side Disposition Plan: Admit to inpatient  Ivor Costa [facility] Pager [phone]  If 7PM-7AM, please contact night-coverage [email or web address] Password [email or web address] 09/09/2014, 8:40  PM

## 2014-09-09 NOTE — Progress Notes (Signed)
Pt refuses CPAP tonight.  RN aware.

## 2014-09-09 NOTE — ED Notes (Signed)
Spoke with PA about BP. Verbalized understanding. NO new orders at this time.

## 2014-09-09 NOTE — Telephone Encounter (Signed)
Spoke with patient. She called to inform us that she feels that she is in distress. She got a flu shot earlier in the week and since has developed a fever. She also complains of being in a fib, having fluid retention and 02 sat of 70%. States that she feels like crap. I informed her that if she feels like she is in distress then she needs to go ahead to the ED. She agrees and request that she does not see a hospitalist she request to see someone on our service. Informed her that I will inform the cardmaster, Wannetta Sender that she is coming. [email or web address] notified patient in route.

## 2014-09-09 NOTE — ED Notes (Signed)
Per EMS, Patient went to the PCP on 09/02/14 to get a flu shot and evaluation. Patient started to have flu like symptoms directly after the visit, and noted redness, warmth, and swelling more so in the left leg than normal. Patient reports pain 10/10. Patient is able to ambulate on her legs with walker. Vitals per EMS: 105/55, 60 HR.

## 2014-09-09 NOTE — ED Notes (Signed)
Lab results reported to [place].

## 2014-09-09 NOTE — ED Provider Notes (Signed)
  This was a shared visit with a mid-level provided (NP or PA).  Throughout the patient's course I was available for consultation/collaboration.  I saw the ECG (if appropriate), relevant labs and studies - I agree with the interpretation.  On my exam the patient was in no distress.  She was awake, alert, appropriately interactive.  She had a notable lymphedema of both lower extremities, and erythematous, indurated skin on the left distal lower extremity concerning for cellulitis.  The patient was mildly hypertensive, though this seems chronic for her, and no edema vital signs were abnormal, patient remained awake, alert and mentating appropriately throughout her emergency department stay.       Carmin Muskrat, MD 09/09/14 2259

## 2014-09-09 NOTE — Progress Notes (Addendum)
ANTICOAGULATION / ANTIBIOTIC CONSULT NOTE - Initial Consult  Pharmacy Consult:  Coumadin / Vancomycin Indication:  Afib + Left leg cellulitis  Allergies  Allergen Reactions  . Ivp Dye [Iodinated Diagnostic Agents] Shortness Of Breath    Turn red, can't breathe  . Betadine [Povidone Iodine]   . Diphenhydramine Hcl Swelling    In hands and eyes  . Fish Allergy Nausea And Vomiting  . Iohexol      Desc: PT TURNS RED AND WHEEZING   . Penicillins Other (See Comments)    Resp arrest as child  . Povidone-Iodine Other (See Comments)    Wheezing, turn red, can't breath, and blisters  . Promethazine Hcl Other (See Comments)    Low Blood Pressure  . Tape     Blisters, can use paper tape for short periods  . Clindamycin Hives and Rash    wheezing  . Iodine Rash  . Morphine Sulfate Nausea And Vomiting and Rash  . Primaxin [Imipenem] Rash    Patient Measurements: Height: 5' 6.93" (170 cm) Weight: 304 lb 0.2 oz (137.9 kg) IBW/kg (Calculated) : 61.44  Vital Signs: Temp: 97.8 F (36.6 C) (09/10 2125) Temp src: Oral (09/10 2125) BP: 91/37 mmHg (09/10 2125) Pulse Rate: 91 (09/10 2125)  Labs:  Recent Labs  09/08/14 09/09/14 1820  HGB  --  9.6*  HCT  --  29.5*  PLT  --  251  INR 2.5  --   CREATININE  --  3.38*    Estimated Creatinine Clearance: 23.8 ml/min (by C-G formula based on Cr of 3.38).   Medical History: Past Medical History  Diagnosis Date  . Personal history of other diseases of circulatory system   . 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 Chronic lymphocytic thyroiditis   . Morbid obesity   . Other chronic pulmonary heart diseases   . Obstructive sleep apnea (adult) (pediatric)   . Other and unspecified hyperlipidemia   . Fibromyalgia   . Goiter   . Thyroiditis, autoimmune   . Hypothyroidism, acquired, autoimmune   . Solitary kidney, acquired   . Fatigue   . Hyperparathyroidism, secondary   . Vitamin D deficiency disease   .  Hyperparathyroidism   . H/O varicose veins 03/27/12  . Type II or unspecified type diabetes mellitus without mention of complication, not stated as uncontrolled   . Diabetes mellitus type II   . Infections of kidney 03/27/12  . History of measles, mumps, or rubella 03/27/12  . Tubal pregnancy 12/23/76  . Unspecified essential hypertension     20 yrs ago  . Blood transfusion 1977    [facility]  . Complication of anesthesia 2010    hard to wake up after knee surgery  . Anemia   . Cardiomyopathy, nonischemic     EF 45 %  . Endometrial hyperplasia with atypia 05/05/12  . Diastolic HF (heart failure) 11/05/2012  . A-fib   . Echocardiogram abnormal 07/15/12    severe MR,mod to severe TR,mod to severe PA hypertension  . Pulmonary arterial hypertension   . PAF (paroxysmal atrial fibrillation), 1st episode since DCCV in Jan/14 03/19/2013  . Ulcers of both lower extremities, small, now with cellulitis 06/05/2013      Assessment: 56 YOF presented with leg swelling to start vancomycin and metronidazole for LLE cellulitis.  Noted patient received vancomycin 1gm IV x 1 earlier today.  Baseline labs reviewed.  Patient has a history of Afib on Coumadin PTA.  Pharmacy also  consulted to manage Coumadin while hospitalized.  INR supra-therapeutic on admit and patient has already taken today's dose.  No bleeding reported.  Will be cautious with dosing as patient is on Flagyl, which may increase Coumadin's effect.   Goal of Therapy:  INR 2-3 Vanc trough:  10-15 mcg/mL    Plan:  - Vanc 1500mg  IV x 1 for a total of 2500mg  today, then 1750mg  IV Q48H - Continue Flagyl 500mg  IV Q8H per MD - Monitor renal fxn, clinical progress, vanc trough at [facility] - Daily PT / INR   Lenoria Narine D. Mina Marble, PharmD, BCPS Pager:  [phone] 09/09/2014, 9:56 PM

## 2014-09-09 NOTE — ED Notes (Signed)
Attempted IV. Unsuccessful. Paged IV Team. Pt made aware of the plan of care.

## 2014-09-09 NOTE — ED Notes (Signed)
Phlebotomy at the bedside  

## 2014-09-09 NOTE — ED Provider Notes (Signed)
CSN: [id number]     Arrival date & time 09/09/14  1659 History   First MD Initiated Contact with Patient 09/09/14 1707     Chief Complaint  Patient presents with  . Leg Swelling     (Consider location/radiation/quality/duration/timing/severity/associated sxs/prior Treatment) HPI Comments: Patient is a 64 year old female with history of fibromyalgia, hypothyroidism, solitary kidney, hyperparathyroidism, diabetes, nonischemic cardiomyopathy with ejection fraction of 35%, atrial fibrillation who presents the emergency department today with left-sided lower leg pain. She reports that her symptoms began on Thursday after receiving a flu shot. Over the weekend she had generalized body aches, shortness of breath. She measured her heart rate at home and it was elevated and her oxygen saturation was low. She did not come in at this time as she thought she could "fight it off". Since that time she has had worsening leg pain, swelling and redness. The pain is throbbing. She has used Voltaren cream without relief of her symptoms.   The history is provided by the patient. No language interpreter was used.    Past Medical History  Diagnosis Date  . Personal history of other diseases of circulatory system   . 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 Chronic lymphocytic thyroiditis   . Morbid obesity   . Other chronic pulmonary heart diseases   . Obstructive sleep apnea (adult) (pediatric)   . Other and unspecified hyperlipidemia   . Fibromyalgia   . Goiter   . Thyroiditis, autoimmune   . Hypothyroidism, acquired, autoimmune   . Solitary kidney, acquired   . Fatigue   . Hyperparathyroidism, secondary   . Vitamin D deficiency disease   . Hyperparathyroidism   . H/O varicose veins 03/27/12  . Type II or unspecified type diabetes mellitus without mention of complication, not stated as uncontrolled   . Diabetes mellitus type II   . Infections of kidney 03/27/12  . History of measles,  mumps, or rubella 03/27/12  . Tubal pregnancy 12/23/76  . Unspecified essential hypertension     20 yrs ago  . Blood transfusion 1977    [facility]  . Complication of anesthesia 2010    hard to wake up after knee surgery  . Anemia   . Cardiomyopathy, nonischemic     EF 45 %  . Endometrial hyperplasia with atypia 05/05/12  . Diastolic HF (heart failure) 11/05/2012  . A-fib   . Echocardiogram abnormal 07/15/12    severe MR,mod to severe TR,mod to severe PA hypertension  . Pulmonary arterial hypertension   . PAF (paroxysmal atrial fibrillation), 1st episode since DCCV in Jan/14 03/19/2013  . Ulcers of both lower extremities, small, now with cellulitis 06/05/2013   Past Surgical History  Procedure Laterality Date  . Cholecystectomy    . Pacemaker placement  02/26/11    Medtronic Adapta  . Knee surgery    . Resection duplicate left kidney    . 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 Hernia repair  1999    ruptured ;  . D& c with hysteroscopy & cervical polypectomy  05/05/12  . Cardioversion N/A 02/06/2013    Successful  . Iud removal  01/30/13  . Ectopic pregnancy surgery  1978  . Cardioversion N/A 06/08/2013    Procedure: CARDIOVERSION;  Surgeon: Sanda Klein, MD;  Location: [facility];  Service: Cardiovascular;  Laterality: N/A;  Room [phone]  . Cardioversion N/A 02/01/2014    Procedure: CARDIOVERSION;  Surgeon: Sanda Klein, MD;  Location: [facility]  ENDOSCOPY;  Service: Cardiovascular;  Laterality: N/A;  . Cardioversion N/A 06/02/2014    Procedure: CARDIOVERSION;  Surgeon: Sanda Klein, MD;  Location: [facility] ENDOSCOPY;  Service: Cardiovascular;  Laterality: N/A;   Family History  Problem Relation Age of Onset  . Cardiomyopathy Brother   . Diabetes Mother   . Cancer Maternal Grandmother   . Heart attack Mother    History  Substance Use Topics  . Smoking status: Never Smoker   . Smokeless tobacco: Never Used  . Alcohol Use: No   OB History   Grav Para Term Preterm Abortions TAB SAB Ect  Mult Living   1 0   1   1  0     Review of Systems  Constitutional: Positive for fever (resolved).  Respiratory: Negative for shortness of breath.   Cardiovascular: Positive for leg swelling. Negative for chest pain.  Skin: Positive for color change and rash.  All other systems reviewed and are negative.     Allergies  Ivp dye; Betadine; Diphenhydramine hcl; Fish allergy; Iohexol; Penicillins; Povidone-iodine; Promethazine hcl; Tape; Clindamycin; Iodine; Morphine sulfate; and Primaxin  Home Medications   Prior to Admission medications   Medication Sig Start Date End Date Taking? Authorizing Provider  albuterol (PROVENTIL HFA;VENTOLIN HFA) 108 (90 BASE) MCG/ACT inhaler Inhale 2 puffs into the lungs every 6 (six) hours as needed for wheezing or shortness of breath.    Yes Historical Provider, MD  albuterol (PROVENTIL) (2.5 MG/3ML) 0.083% nebulizer solution Take 2.5 mg by nebulization every 6 (six) hours as needed for wheezing or shortness of breath.    Yes Historical Provider, MD  amiodarone (PACERONE) 200 MG tablet Take 2 tablets (400 mg total) by mouth 2 (two) times daily. 07/08/14  Yes Mihai Croitoru, MD  calcitRIOL (ROCALTROL) 0.25 MCG capsule Take 0.25 mcg by mouth 2 (two) times daily.   Yes Historical Provider, MD  calcium citrate-vitamin D 200-200 MG-UNIT TABS Take 1 tablet by mouth 4 (four) times daily.    Yes Historical Provider, MD  camphor-menthol Timoteo Ace) lotion Apply 1 application topically daily as needed for itching. 08/03/14  Yes Janece Canterbury, MD  collagenase (SANTYL) ointment Apply 1 application topically daily. Bilateral medial thigh ulcers. 08/03/14  Yes Janece Canterbury, MD  doxycycline (VIBRAMYCIN) 100 MG capsule Take 1 capsule (100 mg total) by mouth 2 (two) times daily. 09/02/14  Yes Elby Showers, MD  glyBURIDE (DIABETA) 5 MG tablet TAKE 1-2 TABLETS TWICE DAILY WITH A MEAL. TAKE 10MG  (2 TABS) IN THE A.M. AND 5MG (1 TAB) AT NIGHT. 07/01/14  Yes Sherrlyn Hock, MD   HYDROcodone-acetaminophen [facility]) 10-325 MG per tablet Take 1 tablet by mouth every 4 (four) hours as needed for moderate pain.  06/29/14  Yes Historical Provider, MD  hydrOXYzine (ATARAX/VISTARIL) 25 MG tablet Take 1 tablet (25 mg total) by mouth every 4 (four) hours as needed for itching. 08/03/14  Yes Janece Canterbury, MD  insulin glargine (LANTUS) 100 UNIT/ML injection Inject 27 Units into the skin at bedtime.  07/03/13  Yes Sherrlyn Hock, MD  iron polysaccharides (NIFEREX) 150 MG capsule Take 150 mg by mouth 2 (two) times daily.   Yes Historical Provider, MD  levothyroxine (SYNTHROID, LEVOTHROID) 125 MCG tablet Take 1 tablet (125 mcg total) by mouth daily before breakfast. 06/02/14  Yes Mihai Croitoru, MD  metolazone (ZAROXOLYN) 5 MG tablet Take 2.5 mg by mouth daily as needed (for weight gain). If up 4-5 lbs. 03/12/14  Yes Mihai Croitoru, MD  metoprolol tartrate (LOPRESSOR) 25 MG  tablet Take 12.5 mg by mouth 2 (two) times daily. 08/03/14  Yes Janece Canterbury, MD  Multiple Vitamin (MULTIVITAMIN) tablet Take 1 tablet by mouth daily.    Yes Historical Provider, MD  mupirocin ointment (BACTROBAN) 2 % Apply topically 2 (two) times daily. Apply to lower extremity ulcers not being treated with hydrotherapy 08/03/14  Yes Janece Canterbury, MD  nitroGLYCERIN (NITROSTAT) 0.4 MG SL tablet Place 0.4 mg under the tongue every 5 (five) minutes as needed. For chest pain   Yes Historical Provider, MD  saccharomyces boulardii (FLORASTOR) 250 MG capsule Take 250 mg by mouth 2 (two) times daily. 08/03/14  Yes Janece Canterbury, MD  sitaGLIPtin (JANUVIA) 100 MG tablet Take 100 mg by mouth daily.   Yes Historical Provider, MD  torsemide (DEMADEX) 100 MG tablet Take 50-100 mg by mouth 2 (two) times daily. 100 mg in the am and 50 mg in the pm 03/08/14  Yes Mihai Croitoru, MD  ULORIC 40 MG tablet Take 80 mg by mouth daily. Take two tablet 05/21/14  Yes Historical Provider, MD  Vitamin D, Ergocalciferol, (DRISDOL) 50000 UNITS CAPS  capsule Take 50,000 Units by mouth 2 (two) times a week. Takes on Tuesdays and Fridays 03/09/14 03/10/15 Yes Sherrlyn Hock, MD  VOLTAREN 1 % GEL Apply 2 g topically 4 (four) times daily as needed (for pain).  03/11/14  Yes Historical Provider, MD  warfarin (COUMADIN) 5 MG tablet Take 2.5-5 mg by mouth See admin instructions. Takes 5 mg on Sunday, Tuesday, Thursday and Saturday and 2.5 mg all other days 01/20/14  Yes Tommy Medal, RPH-CPP  BD PEN NEEDLE NANO U/F 32G X 4 MM MISC USE AS DIRECTED 08/23/14   Sherrlyn Hock, MD  glucose blood (ONE TOUCH ULTRA TEST) test strip Test 3 times daily 03/09/14   Sherrlyn Hock, MD  Insulin Pen Needle (B-D UF III MINI PEN NEEDLES) 31G X 5 MM MISC Check blood sugar up to 6x daily 08/11/13   Sherrlyn Hock, MD   BP 77/52  Pulse 86  Temp(Src) 98 F (36.7 C) (Oral)  Resp 18  SpO2 97% Physical Exam  Nursing note and vitals reviewed. Constitutional: She is oriented to person, place, and time. She appears well-developed and well-nourished. No distress.  HENT:  Head: Normocephalic and atraumatic.  Right Ear: External ear normal.  Left Ear: External ear normal.  Nose: Nose normal.  Mouth/Throat: Oropharynx is clear and moist.  Eyes: Conjunctivae are normal.  Neck: Normal range of motion.  Cardiovascular: Normal rate, regular rhythm and normal heart sounds.   Bilateral lymphedema Erythema to left lower leg. Tender to palpation. Compartment soft.   Pulmonary/Chest: Effort normal and breath sounds normal. No stridor. No respiratory distress. She has no wheezes. She has no rales.  Abdominal: Soft. She exhibits no distension.  Musculoskeletal: Normal range of motion.  Neurological: She is alert and oriented to person, place, and time. She has normal strength.  Skin: Skin is warm and dry. She is not diaphoretic. No erythema.  Psychiatric: She has a normal mood and affect. Her behavior is normal.    ED Course  Procedures (including critical care  time) Labs Review Labs Reviewed  CBC WITH DIFFERENTIAL - Abnormal; Notable for the following:    WBC 19.0 (*)    RBC 3.01 (*)    Hemoglobin 9.6 (*)    HCT 29.5 (*)    RDW 19.7 (*)    Neutrophils Relative % 97 (*)    Neutro Abs 18.4 (*)  Lymphocytes Relative 1 (*)    Lymphs Abs 0.2 (*)    Monocytes Relative 2 (*)    All other components within normal limits  BASIC METABOLIC PANEL - Abnormal; Notable for the following:    Sodium 136 (*)    Chloride 95 (*)    Glucose, Bld 170 (*)    BUN 74 (*)    Creatinine, Ser 3.38 (*)    GFR calc non Af Amer 13 (*)    GFR calc Af Amer 15 (*)    Anion gap 16 (*)    All other components within normal limits  I-STAT CG4 LACTIC ACID, ED - Abnormal; Notable for the following:    Lactic Acid, Venous 3.16 (*)    All other components within normal limits  CULTURE, BLOOD (ROUTINE X 2)  CULTURE, BLOOD (ROUTINE X 2)  PROTIME-INR    Imaging Review No results found.   EKG Interpretation None      MDM   Final diagnoses:  Cardiac pacemaker in situ  Cellulitis of lower leg  Chronic combined systolic and diastolic HF (heart failure)  CKD (chronic kidney disease) stage 4, GFR 15-29 ml/min  Hypothyroidism, acquired, autoimmune  Left leg cellulitis  Long term (current) use of anticoagulants  Type II or unspecified type diabetes mellitus without mention of complication, not stated as uncontrolled    Patient presents to ED for redness to left lower leg. Significant swelling to bilateral legs, erythema to left lower leg. Likely cellulitis. Doubt necrotizing fasciitis. Blood cultures drawn and vancomycin started. Patient with low blood pressures which she states is normal for her. She is currently asymptomatic and BP cuff is on her arm. I do have some suspicion these readings are not accurate. This was discussed with admitting physician. Patient is hemodynamically stable at this time. Pain has been addressed. Dr. Vanita Panda evaluated patient and agrees  with plan. Patient / Family / Caregiver informed of clinical course, understand medical decision-making process, and agree with plan.     Elwyn Lade, PA-C 09/09/14 2100

## 2014-09-09 NOTE — ED Notes (Signed)
Dr. Nui at the bedside. 

## 2014-09-10 ENCOUNTER — Inpatient Hospital Stay (HOSPITAL_COMMUNITY): Payer: Medicare Other

## 2014-09-10 DIAGNOSIS — N189 Chronic kidney disease, unspecified: Secondary | ICD-10-CM

## 2014-09-10 DIAGNOSIS — Y92009 Unspecified place in unspecified non-institutional (private) residence as the place of occurrence of the external cause: Secondary | ICD-10-CM

## 2014-09-10 DIAGNOSIS — I5042 Chronic combined systolic (congestive) and diastolic (congestive) heart failure: Secondary | ICD-10-CM

## 2014-09-10 DIAGNOSIS — R0602 Shortness of breath: Secondary | ICD-10-CM

## 2014-09-10 DIAGNOSIS — R600 Localized edema: Secondary | ICD-10-CM | POA: Diagnosis present

## 2014-09-10 DIAGNOSIS — Z950 Presence of cardiac pacemaker: Secondary | ICD-10-CM

## 2014-09-10 DIAGNOSIS — W19XXXA Unspecified fall, initial encounter: Secondary | ICD-10-CM

## 2014-09-10 DIAGNOSIS — I9589 Other hypotension: Secondary | ICD-10-CM

## 2014-09-10 DIAGNOSIS — I959 Hypotension, unspecified: Secondary | ICD-10-CM

## 2014-09-10 DIAGNOSIS — N179 Acute kidney failure, unspecified: Secondary | ICD-10-CM

## 2014-09-10 LAB — COMPREHENSIVE METABOLIC PANEL
ALK PHOS: 164 U/L — AB (ref 39–117)
ALT: 45 U/L — ABNORMAL HIGH (ref 0–35)
ALT: 49 U/L — AB (ref 0–35)
AST: 45 U/L — AB (ref 0–37)
AST: 57 U/L — AB (ref 0–37)
Albumin: 1.6 g/dL — ABNORMAL LOW (ref 3.5–5.2)
Albumin: 1.6 g/dL — ABNORMAL LOW (ref 3.5–5.2)
Alkaline Phosphatase: 138 U/L — ABNORMAL HIGH (ref 39–117)
Anion gap: 13 (ref 5–15)
Anion gap: 14 (ref 5–15)
BILIRUBIN TOTAL: 1.5 mg/dL — AB (ref 0.3–1.2)
BUN: 77 mg/dL — ABNORMAL HIGH (ref 6–23)
BUN: 80 mg/dL — ABNORMAL HIGH (ref 6–23)
CALCIUM: 8.5 mg/dL (ref 8.4–10.5)
CHLORIDE: 95 meq/L — AB (ref 96–112)
CO2: 26 meq/L (ref 19–32)
CO2: 25 mEq/L (ref 19–32)
Calcium: 8.4 mg/dL (ref 8.4–10.5)
Chloride: 95 mEq/L — ABNORMAL LOW (ref 96–112)
Creatinine, Ser: 3.66 mg/dL — ABNORMAL HIGH (ref 0.50–1.10)
Creatinine, Ser: 3.87 mg/dL — ABNORMAL HIGH (ref 0.50–1.10)
GFR calc Af Amer: 14 mL/min — ABNORMAL LOW (ref >90)
GFR calc Af Amer: 13 mL/min — ABNORMAL LOW (ref >90)
GFR calc non Af Amer: 11 mL/min — ABNORMAL LOW (ref >90)
GFR, EST NON AFRICAN AMERICAN: 12 mL/min — AB (ref >90)
GLUCOSE: 108 mg/dL — AB (ref 70–99)
Glucose, Bld: 172 mg/dL — ABNORMAL HIGH (ref 70–99)
POTASSIUM: 5.1 meq/L (ref 3.7–5.3)
Potassium: 5.2 mEq/L (ref 3.7–5.3)
SODIUM: 134 meq/L — AB (ref 137–147)
SODIUM: 134 meq/L — AB (ref 137–147)
TOTAL PROTEIN: 6.2 g/dL (ref 6.0–8.3)
Total Bilirubin: 1.6 mg/dL — ABNORMAL HIGH (ref 0.3–1.2)
Total Protein: 6.2 g/dL (ref 6.0–8.3)

## 2014-09-10 LAB — CBC WITH DIFFERENTIAL/PLATELET
Basophils Absolute: 0 10*3/uL (ref 0.0–0.1)
Basophils Relative: 0 % (ref 0–1)
EOS ABS: 0 10*3/uL (ref 0.0–0.7)
Eosinophils Relative: 0 % (ref 0–5)
HEMATOCRIT: 26.1 % — AB (ref 36.0–46.0)
Hemoglobin: 8.4 g/dL — ABNORMAL LOW (ref 12.0–15.0)
LYMPHS ABS: 0.3 10*3/uL — AB (ref 0.7–4.0)
LYMPHS PCT: 2 % — AB (ref 12–46)
MCH: 30.9 pg (ref 26.0–34.0)
MCHC: 32.2 g/dL (ref 30.0–36.0)
MCV: 96 fL (ref 78.0–100.0)
MONO ABS: 0.4 10*3/uL (ref 0.1–1.0)
Monocytes Relative: 2 % — ABNORMAL LOW (ref 3–12)
Neutro Abs: 18.2 10*3/uL — ABNORMAL HIGH (ref 1.7–7.7)
Neutrophils Relative %: 96 % — ABNORMAL HIGH (ref 43–77)
PLATELETS: 214 10*3/uL (ref 150–400)
RBC: 2.72 MIL/uL — AB (ref 3.87–5.11)
RDW: 20 % — AB (ref 11.5–15.5)
WBC: 18.9 10*3/uL — ABNORMAL HIGH (ref 4.0–10.5)

## 2014-09-10 LAB — PROTIME-INR
INR: 3.87 — ABNORMAL HIGH (ref 0.00–1.49)
Prothrombin Time: 38 seconds — ABNORMAL HIGH (ref 11.6–15.2)

## 2014-09-10 LAB — MRSA PCR SCREENING: MRSA BY PCR: NEGATIVE

## 2014-09-10 LAB — URINALYSIS, ROUTINE W REFLEX MICROSCOPIC
Glucose, UA: NEGATIVE mg/dL
Ketones, ur: 15 mg/dL — AB
NITRITE: NEGATIVE
PROTEIN: NEGATIVE mg/dL
SPECIFIC GRAVITY, URINE: 1.027 (ref 1.005–1.030)
UROBILINOGEN UA: 1 mg/dL (ref 0.0–1.0)
pH: 5 (ref 5.0–8.0)

## 2014-09-10 LAB — LACTIC ACID, PLASMA
LACTIC ACID, VENOUS: 3 mmol/L — AB (ref 0.5–2.2)
LACTIC ACID, VENOUS: 2.8 mmol/L — AB (ref 0.5–2.2)

## 2014-09-10 LAB — GLUCOSE, CAPILLARY
GLUCOSE-CAPILLARY: 159 mg/dL — AB (ref 70–99)
GLUCOSE-CAPILLARY: 147 mg/dL — AB (ref 70–99)
Glucose-Capillary: 124 mg/dL — ABNORMAL HIGH (ref 70–99)
Glucose-Capillary: 123 mg/dL — ABNORMAL HIGH (ref 70–99)

## 2014-09-10 LAB — TYPE AND SCREEN
ABO/RH(D): O POS
ANTIBODY SCREEN: NEGATIVE

## 2014-09-10 LAB — CARBOXYHEMOGLOBIN
CARBOXYHEMOGLOBIN: 2.5 % — AB (ref 0.5–1.5)
METHEMOGLOBIN: 0.7 % (ref 0.0–1.5)
O2 SAT: 86.7 %
Total hemoglobin: 8.5 g/dL — ABNORMAL LOW (ref 12.0–16.0)

## 2014-09-10 LAB — URINE MICROSCOPIC-ADD ON

## 2014-09-10 LAB — ABO/RH: ABO/RH(D): O POS

## 2014-09-10 MED ORDER — SODIUM CHLORIDE 0.9 % IV BOLUS (SEPSIS)
250.0000 mL | Freq: Once | INTRAVENOUS | Status: AC
  Administered 2014-09-10: 250 mL via INTRAVENOUS

## 2014-09-10 MED ORDER — INSULIN GLARGINE 100 UNIT/ML ~~LOC~~ SOLN: 10.0000 [IU] | Freq: Every day | SUBCUTANEOUS | Status: DC

## 2014-09-10 MED ORDER — PHENYLEPHRINE HCL 10 MG/ML IJ SOLN
20.0000 ug/min | INTRAMUSCULAR | Status: DC
  Administered 2014-09-10: 40 ug/min via INTRAVENOUS
  Filled 2014-09-10: qty 1

## 2014-09-10 MED ORDER — MUPIROCIN CALCIUM 2 % EX CREA
TOPICAL_CREAM | Freq: Every day | CUTANEOUS | Status: DC
  Administered 2014-09-11 (×2): 1 via TOPICAL
  Administered 2014-09-15 – 2014-09-19 (×4): via TOPICAL
  Filled 2014-09-10: qty 15

## 2014-09-10 MED ORDER — CIPROFLOXACIN IN D5W 400 MG/200ML IV SOLN
400.0000 mg | INTRAVENOUS | Status: AC
  Administered 2014-09-10: 400 mg via INTRAVENOUS
  Filled 2014-09-10 (×2): qty 200

## 2014-09-10 MED ORDER — METOPROLOL TARTRATE 12.5 MG HALF TABLET
12.5000 mg | ORAL_TABLET | Freq: Two times a day (BID) | ORAL | Status: DC
  Filled 2014-09-10: qty 1

## 2014-09-10 MED ORDER — SODIUM CHLORIDE 0.9 % IV BOLUS (SEPSIS): 1000.0000 mL | INTRAVENOUS | Status: DC | PRN

## 2014-09-10 MED ORDER — PHENYLEPHRINE HCL 10 MG/ML IJ SOLN
20.0000 ug/min | INTRAVENOUS | Status: DC
  Administered 2014-09-10: 110 ug/min via INTRAVENOUS
  Administered 2014-09-11: 130 ug/min via INTRAVENOUS
  Administered 2014-09-11: 150 ug/min via INTRAVENOUS
  Administered 2014-09-11: 100 ug/min via INTRAVENOUS
  Administered 2014-09-11: 40 ug/min via INTRAVENOUS
  Filled 2014-09-10 (×7): qty 4

## 2014-09-10 MED ORDER — COLLAGENASE 250 UNIT/GM EX OINT
TOPICAL_OINTMENT | Freq: Every day | CUTANEOUS | Status: DC
  Administered 2014-09-10 – 2014-09-15 (×5): via TOPICAL
  Administered 2014-09-16: 1 via TOPICAL
  Administered 2014-09-17: 12:00:00 via TOPICAL
  Filled 2014-09-10: qty 30

## 2014-09-10 MED ORDER — INSULIN GLARGINE 100 UNIT/ML ~~LOC~~ SOLN
10.0000 [IU] | Freq: Every day | SUBCUTANEOUS | Status: DC
  Administered 2014-09-10 – 2014-09-12 (×3): 10 [IU] via SUBCUTANEOUS
  Filled 2014-09-10 (×4): qty 0.1

## 2014-09-10 MED ORDER — INSULIN ASPART 100 UNIT/ML ~~LOC~~ SOLN
0.0000 [IU] | SUBCUTANEOUS | Status: DC
  Administered 2014-09-11: 2 [IU] via SUBCUTANEOUS

## 2014-09-10 MED ORDER — CIPROFLOXACIN IN D5W 400 MG/200ML IV SOLN
400.0000 mg | INTRAVENOUS | Status: DC
  Filled 2014-09-10: qty 200

## 2014-09-10 NOTE — Procedures (Signed)
Central Venous Catheter Insertion Procedure Note JILLIANNE GAMINO [id number] [birth year]  Procedure: Insertion of Central Venous Catheter Indications: Assessment of intravascular volume, Drug and/or fluid administration and Frequent blood sampling  Procedure Details Consent: Risks of procedure as well as the alternatives and risks of each were explained to the (patient/caregiver).  Consent for procedure obtained. Time Out: Verified patient identification, verified procedure, site/side was marked, verified correct patient position, special equipment/implants available, medications/allergies/relevent history reviewed, required imaging and test results available.  Performed  Real time [place] used to Id and cannulate the vessel  Maximum sterile technique was used including antiseptics, cap, gloves, gown, hand hygiene, mask and sheet. Skin prep: Chlorhexidine; local anesthetic administered A antimicrobial bonded/coated triple lumen catheter was placed in the left internal jugular vein using the Seldinger technique.  Evaluation Blood flow good Complications: No apparent complications Patient did tolerate procedure well. Chest X-ray ordered to verify placement.  CXR: pending.  BABCOCK,PETE 09/10/2014, 4:33 PM  US guidance Supervised Emergent, shock  Lavon Paganini. Titus Mould, MD, [place] Pgr: [facility]

## 2014-09-10 NOTE — Progress Notes (Signed)
GPC in one blood culture bottle  Patient on vanc for cellulitis  Plan Monitor  Dr. Brand Males, M.D., [facility].C.P Pulmonary and Critical Care Medicine Staff Physician [facility] Pager: [phone], If no answer or between  15:00h - 7:00h: call [phone]  09/10/2014 9:27 PM

## 2014-09-10 NOTE — Progress Notes (Addendum)
Pt BP still running low this AM, 76/39, pt asymptomatic.  MD on call notified to make aware.  Will continue to monitor.  Pt resting with call bell in reach.     New order received for 250cc bolus.  Will report to day RN to follow up on BP.

## 2014-09-10 NOTE — Progress Notes (Signed)
Utilization review completed. Elzada Pytel, RN, BSN. 

## 2014-09-10 NOTE — Discharge Instructions (Signed)

## 2014-09-10 NOTE — Progress Notes (Addendum)
Pt request to take bp med with bp in 90's, she states she take it at home like that and her bp in always in the [place]   Transfer to [place] for sepsis protocol, report given to [employer]. Sherrie Mustache 3:40 PM

## 2014-09-10 NOTE — Consult Note (Signed)
Reason for Consult:   CHF  Requesting Physician: Dr Eliseo Squires  HPI:         This is a 64 y.o. female followed by Dr Sallyanne Kuster and Dr Renold Genta with a PMH significant for multiple medical problems. 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 Her last echo was in June 2014. She is not felt to be a candidate for valve surgery secondary to multiple co-morbidities. Her other problems include, PAF-recurrent despite increasing doses of Amiodarone and two cardioversions so far this year, a prior pacemaker, morbid obesity, massive chronic LE edema, and DM. She also has a single kidney and at least stage III chronic kidney disease and has had very volatile renal function parameters over the last couple of years. She is followed by Dr Lorrene Reid. In the past she has declined to consider dialysis but has reconsidered according to Dr Victorino December last office note. She has chronic massive LE edema and has been going to the wound center for care although she tells me at times she gets frustrated with them.           She was last cardioverted on June 3d on increased Amiodarone and was noted to be in AF again on 06/27/14 pacer interogation. She was admitted with cellulitis and sepsis 07/23/14-08/03/14. She then went to rehab and was discharged 08/18/14. She was re admitted 09/09/14 with recurrent cellulitis of her LLE. Her wgt is not far off her baseline- 335, but she tells me it got as low as 310 when she was hospitalized at [facility]. She tells me her Amiodarone has been increased to 400 mg BID and she has not been prescribed her diuretics. She is somewhat of a rambling historian and has noticably declined since I saw her in March.    PMHx:  Past Medical History  Diagnosis Date  . Personal history of other diseases of circulatory system   . Cardiac pacemaker in situ 02/26/11      Medtronic Adapta  . Chronic lymphocytic thyroiditis   . Morbid obesity   . Other chronic pulmonary heart diseases   . Obstructive sleep apnea (adult) (pediatric)   . Other and unspecified hyperlipidemia   . Fibromyalgia   . Goiter   . Thyroiditis, autoimmune   . Hypothyroidism, acquired, autoimmune   . Solitary kidney, acquired   . Fatigue   . Hyperparathyroidism, secondary   . Vitamin D deficiency disease   . Hyperparathyroidism   . H/O varicose veins 03/27/12  . Type II or unspecified type diabetes mellitus without mention of complication, not stated as uncontrolled   . Diabetes mellitus type II   . Infections of kidney 03/27/12  . History of measles, mumps, or rubella 03/27/12  . Tubal pregnancy 12/23/76  . Unspecified essential hypertension     20 yrs ago  . Blood transfusion 1977    [facility]  . Complication of anesthesia 2010    hard to wake up after knee surgery  . Anemia   . Cardiomyopathy, nonischemic     EF 45 %  . Endometrial hyperplasia with atypia 05/05/12  . Diastolic HF (heart failure) 11/05/2012  . A-fib   . Echocardiogram abnormal 07/15/12    severe MR,mod to severe TR,mod to severe PA hypertension  . Pulmonary arterial hypertension   . PAF (paroxysmal atrial fibrillation), 1st episode  since DCCV in Jan/14 03/19/2013  . Ulcers of both lower extremities, small, now with cellulitis 06/05/2013    Past Surgical History  Procedure Laterality Date  . Cholecystectomy    . Pacemaker placement  02/26/11    Medtronic Adapta  . Knee surgery    . Resection duplicate left kidney    . 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 Hernia repair  1999    ruptured ;  . D& c with hysteroscopy & cervical polypectomy  05/05/12  . Cardioversion N/A 02/06/2013    Successful  . Iud removal  01/30/13  . Ectopic pregnancy surgery  1978  . Cardioversion N/A 06/08/2013    Procedure: CARDIOVERSION;  Surgeon: Sanda Klein, MD;  Location: [place];  Service: Cardiovascular;   Laterality: N/A;  Room [id number]  . Cardioversion N/A 02/01/2014    Procedure: CARDIOVERSION;  Surgeon: Sanda Klein, MD;  Location: [place];  Service: Cardiovascular;  Laterality: N/A;  . Cardioversion N/A 06/02/2014    Procedure: CARDIOVERSION;  Surgeon: Sanda Klein, MD;  Location: [facility];  Service: Cardiovascular;  Laterality: N/A;    SOCHx:  reports that she has never smoked. She has never used smokeless tobacco. She reports that she does not drink alcohol or use illicit drugs.  FAMHx: Family History  Problem Relation Age of Onset  . Cardiomyopathy Brother   . Diabetes Mother   . Cancer Maternal Grandmother   . Heart attack Mother     ALLERGIES: Allergies  Allergen Reactions  . Ivp Dye [Iodinated Diagnostic Agents] Shortness Of Breath    Turn red, can't breathe  . Betadine [Povidone Iodine]   . Diphenhydramine Hcl Swelling    In hands and eyes  . Fish Allergy Nausea And Vomiting  . Iohexol      Desc: PT TURNS RED AND WHEEZING   . Penicillins Other (See Comments)    Resp arrest as child  . Povidone-Iodine Other (See Comments)    Wheezing, turn red, can't breath, and blisters  . Promethazine Hcl Other (See Comments)    Low Blood Pressure  . Tape     Blisters, can use paper tape for short periods  . Clindamycin Hives and Rash    wheezing  . Iodine Rash  . Morphine Sulfate Nausea And Vomiting and Rash  . Primaxin [Imipenem] Rash    ROS: Pertinent items are noted in HPI. see H&P for complete ROS.   HOME MEDICATIONS: Prior to Admission medications   Medication Sig Start Date End Date Taking? Authorizing Provider  albuterol (PROVENTIL HFA;VENTOLIN HFA) 108 (90 BASE) MCG/ACT inhaler Inhale 2 puffs into the lungs every 6 (six) hours as needed for wheezing or shortness of breath.    Yes Historical Provider, MD  albuterol (PROVENTIL) (2.5 MG/3ML) 0.083% nebulizer solution Take 2.5 mg by nebulization every 6 (six) hours as needed for wheezing or shortness of  breath.    Yes Historical Provider, MD  amiodarone (PACERONE) 200 MG tablet Take 2 tablets (400 mg total) by mouth 2 (two) times daily. 07/08/14  Yes Mihai Croitoru, MD  calcitRIOL (ROCALTROL) 0.25 MCG capsule Take 0.25 mcg by mouth 2 (two) times daily.   Yes Historical Provider, MD  calcium citrate-vitamin D 200-200 MG-UNIT TABS Take 1 tablet by mouth 4 (four) times daily.    Yes Historical Provider, MD  camphor-menthol Timoteo Ace) lotion Apply 1 application topically daily as needed for itching. 08/03/14  Yes Janece Canterbury, MD  collagenase (SANTYL) ointment Apply 1 application topically daily.  Bilateral medial thigh ulcers. 08/03/14  Yes Janece Canterbury, MD  doxycycline (VIBRAMYCIN) 100 MG capsule Take 1 capsule (100 mg total) by mouth 2 (two) times daily. 09/02/14  Yes Elby Showers, MD  glyBURIDE (DIABETA) 5 MG tablet TAKE 1-2 TABLETS TWICE DAILY WITH A MEAL. TAKE 10MG  (2 TABS) IN THE A.M. AND 5MG (1 TAB) AT NIGHT. 07/01/14  Yes Sherrlyn Hock, MD  HYDROcodone-acetaminophen [facility]) 10-325 MG per tablet Take 1 tablet by mouth every 4 (four) hours as needed for moderate pain.  06/29/14  Yes Historical Provider, MD  hydrOXYzine (ATARAX/VISTARIL) 25 MG tablet Take 1 tablet (25 mg total) by mouth every 4 (four) hours as needed for itching. 08/03/14  Yes Janece Canterbury, MD  insulin glargine (LANTUS) 100 UNIT/ML injection Inject 27 Units into the skin at bedtime.  07/03/13  Yes Sherrlyn Hock, MD  iron polysaccharides (NIFEREX) 150 MG capsule Take 150 mg by mouth 2 (two) times daily.   Yes Historical Provider, MD  levothyroxine (SYNTHROID, LEVOTHROID) 125 MCG tablet Take 1 tablet (125 mcg total) by mouth daily before breakfast. 06/02/14  Yes Mihai Croitoru, MD  metolazone (ZAROXOLYN) 5 MG tablet Take 2.5 mg by mouth daily as needed (for weight gain). If up 4-5 lbs. 03/12/14  Yes Mihai Croitoru, MD  metoprolol tartrate (LOPRESSOR) 25 MG tablet Take 12.5 mg by mouth 2 (two) times daily. 08/03/14  Yes Janece Canterbury, MD   Multiple Vitamin (MULTIVITAMIN) tablet Take 1 tablet by mouth daily.    Yes Historical Provider, MD  mupirocin ointment (BACTROBAN) 2 % Apply topically 2 (two) times daily. Apply to lower extremity ulcers not being treated with hydrotherapy 08/03/14  Yes Janece Canterbury, MD  nitroGLYCERIN (NITROSTAT) 0.4 MG SL tablet Place 0.4 mg under the tongue every 5 (five) minutes as needed. For chest pain   Yes Historical Provider, MD  saccharomyces boulardii (FLORASTOR) 250 MG capsule Take 250 mg by mouth 2 (two) times daily. 08/03/14  Yes Janece Canterbury, MD  sitaGLIPtin (JANUVIA) 100 MG tablet Take 100 mg by mouth daily.   Yes Historical Provider, MD  torsemide (DEMADEX) 100 MG tablet Take 50-100 mg by mouth 2 (two) times daily. 100 mg in the am and 50 mg in the pm 03/08/14  Yes Mihai Croitoru, MD  ULORIC 40 MG tablet Take 80 mg by mouth daily. Take two tablet 05/21/14  Yes Historical Provider, MD  Vitamin D, Ergocalciferol, (DRISDOL) 50000 UNITS CAPS capsule Take 50,000 Units by mouth 2 (two) times a week. Takes on Tuesdays and Fridays 03/09/14 03/10/15 Yes Sherrlyn Hock, MD  VOLTAREN 1 % GEL Apply 2 g topically 4 (four) times daily as needed (for pain).  03/11/14  Yes Historical Provider, MD  warfarin (COUMADIN) 5 MG tablet Take 2.5-5 mg by mouth See admin instructions. Takes 5 mg on Sunday, Tuesday, Thursday and Saturday and 2.5 mg all other days 01/20/14  Yes Tommy Medal, RPH-CPP  BD PEN NEEDLE NANO U/F 32G X 4 MM MISC USE AS DIRECTED 08/23/14   Sherrlyn Hock, MD  glucose blood (ONE TOUCH ULTRA TEST) test strip Test 3 times daily 03/09/14   Sherrlyn Hock, MD  Insulin Pen Needle (B-D UF III MINI PEN NEEDLES) 31G X 5 MM MISC Check blood sugar up to 6x daily 08/11/13   Sherrlyn Hock, MD    HOSPITAL MEDICATIONS: I have reviewed the patient's current medications.  VITALS: Blood pressure 79/39, pulse 96, temperature 98.2 F (36.8 C), temperature source Oral, resp. rate 20, height  5' 6.93" (1.7 m),  weight 335 lb 14.4 oz (152.363 kg), SpO2 93.00%.  PHYSICAL EXAM: General appearance: alert, cooperative, morbidly obese and pale Neck: no carotid bruit Lungs: clear to auscultation bilaterally Heart: irregularly irregular rhythm Abdomen: obese Extremities: massive bilater LE edema with diffuse errythema and blisters on her LLE Pulses: diminnished Skin: pale, cool, dry Neurologic: Grossly normal  LABS: Results for orders placed during the hospital encounter of 09/09/14 (from the past 24 hour(s))  CBC WITH DIFFERENTIAL     Status: Abnormal   Collection Time    09/09/14  6:20 PM      Result Value Ref Range   WBC 19.0 (*) 4.0 - 10.5 K/uL   RBC 3.01 (*) 3.87 - 5.11 MIL/uL   Hemoglobin 9.6 (*) 12.0 - 15.0 g/dL   HCT 29.5 (*) 36.0 - 46.0 %   MCV 98.0  78.0 - 100.0 fL   MCH 31.9  26.0 - 34.0 pg   MCHC 32.5  30.0 - 36.0 g/dL   RDW 19.7 (*) 11.5 - 15.5 %   Platelets 251  150 - 400 K/uL   Neutrophils Relative % 97 (*) 43 - 77 %   Neutro Abs 18.4 (*) 1.7 - 7.7 K/uL   Lymphocytes Relative 1 (*) 12 - 46 %   Lymphs Abs 0.2 (*) 0.7 - 4.0 K/uL   Monocytes Relative 2 (*) 3 - 12 %   Monocytes Absolute 0.4  0.1 - 1.0 K/uL   Eosinophils Relative 0  0 - 5 %   Eosinophils Absolute 0.0  0.0 - 0.7 K/uL   Basophils Relative 0  0 - 1 %   Basophils Absolute 0.0  0.0 - 0.1 K/uL  BASIC METABOLIC PANEL     Status: Abnormal   Collection Time    09/09/14  6:20 PM      Result Value Ref Range   Sodium 136 (*) 137 - 147 mEq/L   Potassium 4.9  3.7 - 5.3 mEq/L   Chloride 95 (*) 96 - 112 mEq/L   CO2 25  19 - 32 mEq/L   Glucose, Bld 170 (*) 70 - 99 mg/dL   BUN 74 (*) 6 - 23 mg/dL   Creatinine, Ser 3.38 (*) 0.50 - 1.10 mg/dL   Calcium 8.8  8.4 - 10.5 mg/dL   GFR calc non Af Amer 13 (*) >90 mL/min   GFR calc Af Amer 15 (*) >90 mL/min   Anion gap 16 (*) 5 - 15  CULTURE, BLOOD (ROUTINE X 2)     Status: None   Collection Time    09/09/14  6:20 PM      Result Value Ref Range   Specimen Description BLOOD  RIGHT ARM     Special Requests BOTTLES DRAWN AEROBIC AND ANAEROBIC 10CC     Culture  Setup Time       Value: 09/09/2014 22:28     Performed at [facility]   Culture       Value:        BLOOD CULTURE RECEIVED NO GROWTH TO DATE CULTURE WILL BE HELD FOR 5 DAYS BEFORE ISSUING A FINAL NEGATIVE REPORT     Performed at [facility]   Report Status PENDING    I-STAT CG4 LACTIC ACID, ED     Status: Abnormal   Collection Time    09/09/14  6:35 PM      Result Value Ref Range   Lactic Acid, Venous 3.16 (*) 0.5 - 2.2 mmol/L  PROTIME-INR     Status: Abnormal   Collection Time    09/09/14  9:30 PM      Result Value Ref Range   Prothrombin Time 34.3 (*) 11.6 - 15.2 seconds   INR 3.40 (*) 0.00 - 1.49  GLUCOSE, CAPILLARY     Status: Abnormal   Collection Time    09/09/14 10:35 PM      Result Value Ref Range   Glucose-Capillary 155 (*) 70 - 99 mg/dL   Comment 1 Notify RN    COMPREHENSIVE METABOLIC PANEL     Status: Abnormal   Collection Time    09/10/14  4:10 AM      Result Value Ref Range   Sodium 134 (*) 137 - 147 mEq/L   Potassium 5.2  3.7 - 5.3 mEq/L   Chloride 95 (*) 96 - 112 mEq/L   CO2 26  19 - 32 mEq/L   Glucose, Bld 172 (*) 70 - 99 mg/dL   BUN 77 (*) 6 - 23 mg/dL   Creatinine, Ser 3.66 (*) 0.50 - 1.10 mg/dL   Calcium 8.4  8.4 - 10.5 mg/dL   Total Protein 6.2  6.0 - 8.3 g/dL   Albumin 1.6 (*) 3.5 - 5.2 g/dL   AST 45 (*) 0 - 37 U/L   ALT 45 (*) 0 - 35 U/L   Alkaline Phosphatase 138 (*) 39 - 117 U/L   Total Bilirubin 1.5 (*) 0.3 - 1.2 mg/dL   GFR calc non Af Amer 12 (*) >90 mL/min   GFR calc Af Amer 14 (*) >90 mL/min   Anion gap 13  5 - 15  CBC WITH DIFFERENTIAL     Status: Abnormal   Collection Time    09/10/14  4:10 AM      Result Value Ref Range   WBC 18.9 (*) 4.0 - 10.5 K/uL   RBC 2.72 (*) 3.87 - 5.11 MIL/uL   Hemoglobin 8.4 (*) 12.0 - 15.0 g/dL   HCT 26.1 (*) 36.0 - 46.0 %   MCV 96.0  78.0 - 100.0 fL   MCH 30.9  26.0 - 34.0 pg   MCHC 32.2  30.0 - 36.0  g/dL   RDW 20.0 (*) 11.5 - 15.5 %   Platelets 214  150 - 400 K/uL   Neutrophils Relative % 96 (*) 43 - 77 %   Neutro Abs 18.2 (*) 1.7 - 7.7 K/uL   Lymphocytes Relative 2 (*) 12 - 46 %   Lymphs Abs 0.3 (*) 0.7 - 4.0 K/uL   Monocytes Relative 2 (*) 3 - 12 %   Monocytes Absolute 0.4  0.1 - 1.0 K/uL   Eosinophils Relative 0  0 - 5 %   Eosinophils Absolute 0.0  0.0 - 0.7 K/uL   Basophils Relative 0  0 - 1 %   Basophils Absolute 0.0  0.0 - 0.1 K/uL  PROTIME-INR     Status: Abnormal   Collection Time    09/10/14  4:10 AM      Result Value Ref Range   Prothrombin Time 38.0 (*) 11.6 - 15.2 seconds   INR 3.87 (*) 0.00 - 1.49  LACTIC ACID, PLASMA     Status: Abnormal   Collection Time    09/10/14  4:10 AM      Result Value Ref Range   Lactic Acid, Venous 3.0 (*) 0.5 - 2.2 mmol/L  GLUCOSE, CAPILLARY     Status: Abnormal   Collection Time    09/10/14  6:55  AM      Result Value Ref Range   Glucose-Capillary 159 (*) 70 - 99 mg/dL  GLUCOSE, CAPILLARY     Status: Abnormal   Collection Time    09/10/14 11:08 AM      Result Value Ref Range   Glucose-Capillary 147 (*) 70 - 99 mg/dL    IMAGING: No results found.  IMPRESSION: Principal Problem:   Cellulitis of Lt lower leg Active Problems:   Chronic combined systolic and diastolic CHF   Pulm HTN with severe TR   Cardiomyopathy, nonischemic- EF 30-35% June 2014   Type 2 diabetes mellitus   CKD (chronic kidney disease) stage 4, GFR 15-29 ml/min   PAF- recurrent despite Amiodarone and multiple cardioversions   Mitral insufficiency, moderate to severe   Chronic massive bilat LE lymphadema   Sleep apnea- on C-pap   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   Obesity hypoventilation syndrome-    Long term (current) use of anticoagulants   Fall at home- 3 falls this summer, sound orthostatic   RECOMMENDATION: Will review with MD. ? Cardiovert again on increased Amiodarone. She will need her diuretics resumed at some point, currently her SCr is above her  baseline (2.5).   Time Spent Directly with Patient: 50 minutes  Erlene Quan [phone] beeper 09/10/2014, 1:34 PM    Patient seen and examined with Kerin Ransom, PA-C. We discussed all aspects of the encounter. I agree with the assessment and plan as stated above. Volume status   Very complicated case. Volume status doesn't look to bad at this point but my main concern is early sepsis in the setting of severe cellulitis. She is mildly hypotensive (I dopplered BP at 86-88), febrile with prominent leukocytosis and lactic acidosis. Agree with gentle fluid bolus but she probably would be best served with transfer to SDU with placement of central access for measurement of CVP and Co-ox. Abx coverage per primary team. May need pressors.   Volume status is hard to judge but currently looks to be reasonable for her. Agree with repeating echo. Would hold diuretics at this point.   With regard to AF. She seems to do better in NSR but ahs failed amio. Once she improves may be reasonable to try adding Ranexa to [facility] and repeating DC-CV and see if she will hold. Continue coumadin.   Jesyka Slaght,MD 1:59 PM

## 2014-09-10 NOTE — Progress Notes (Signed)
ANTIBIOTIC CONSULT NOTE - INITIAL  Pharmacy Consult:  Cipro Indication:  Cellulitis  Allergies  Allergen Reactions  . Ivp Dye [Iodinated Diagnostic Agents] Shortness Of Breath    Turn red, can't breathe  . Betadine [Povidone Iodine]   . Diphenhydramine Hcl Swelling    In hands and eyes  . Fish Allergy Nausea And Vomiting  . Iohexol      Desc: PT TURNS RED AND WHEEZING   . Penicillins Other (See Comments)    Resp arrest as child  . Povidone-Iodine Other (See Comments)    Wheezing, turn red, can't breath, and blisters  . Promethazine Hcl Other (See Comments)    Low Blood Pressure  . Tape     Blisters, can use paper tape for short periods  . Clindamycin Hives and Rash    wheezing  . Iodine Rash  . Morphine Sulfate Nausea And Vomiting and Rash  . Primaxin [Imipenem] Rash    Patient Measurements: Height: 5' 6.93" (170 cm) Weight: 335 lb 14.4 oz (152.363 kg) IBW/kg (Calculated) : 61.44  Vital Signs: Temp: 98.1 F (36.7 C) (09/11 1645) Temp src: Oral (09/11 1645) BP: 92/55 mmHg (09/11 1620) Pulse Rate: 98 (09/11 1620) Intake/Output from previous day: 09/10 0701 - 09/11 0700 In: 700 [IV Piggyback:700] Out: -  Intake/Output from this shift: Total I/O In: 600 [P.O.:600] Out: -   Labs:  Recent Labs  09/09/14 1820 09/10/14 0410  WBC 19.0* 18.9*  HGB 9.6* 8.4*  PLT 251 214  CREATININE 3.38* 3.66*   Estimated Creatinine Clearance: 23.3 ml/min (by C-G formula based on Cr of 3.66). No results found for this basename: VANCOTROUGH, VANCOPEAK, VANCORANDOM, Gordon, GENTPEAK, GENTRANDOM, TOBRATROUGH, TOBRAPEAK, TOBRARND, AMIKACINPEAK, AMIKACINTROU, AMIKACIN,  in the last 72 hours   Microbiology: Recent Results (from the past 720 hour(s))  CULTURE, BLOOD (ROUTINE X 2)     Status: None   Collection Time    09/09/14  6:20 PM      Result Value Ref Range Status   Specimen Description BLOOD RIGHT ARM   Final   Special Requests BOTTLES DRAWN AEROBIC AND ANAEROBIC 10CC    Final   Culture  Setup Time     Final   Value: 09/09/2014 22:28     Performed at [facility]   Culture     Final   Value:        BLOOD CULTURE RECEIVED NO GROWTH TO DATE CULTURE WILL BE HELD FOR 5 DAYS BEFORE ISSUING A FINAL NEGATIVE REPORT     Performed at [facility]   Report Status PENDING   Incomplete    Medical History: Past Medical History  Diagnosis Date  . Personal history of other diseases of circulatory system   . 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 Chronic lymphocytic thyroiditis   . Morbid obesity   . Other chronic pulmonary heart diseases   . Obstructive sleep apnea (adult) (pediatric)   . Other and unspecified hyperlipidemia   . Fibromyalgia   . Goiter   . Thyroiditis, autoimmune   . Hypothyroidism, acquired, autoimmune   . Solitary kidney, acquired   . Fatigue   . Hyperparathyroidism, secondary   . Vitamin D deficiency disease   . Hyperparathyroidism   . H/O varicose veins 03/27/12  . Type II or unspecified type diabetes mellitus without mention of complication, not stated as uncontrolled   . Diabetes mellitus type II   . Infections of kidney 03/27/12  . History of  measles, mumps, or rubella 03/27/12  . Tubal pregnancy 12/23/76  . Unspecified essential hypertension     20 yrs ago  . Blood transfusion 1977    [facility]  . Complication of anesthesia 2010    hard to wake up after knee surgery  . Anemia   . Cardiomyopathy, nonischemic     EF 45 %  . Endometrial hyperplasia with atypia 05/05/12  . Diastolic HF (heart failure) 11/05/2012  . A-fib   . Echocardiogram abnormal 07/15/12    severe MR,mod to severe TR,mod to severe PA hypertension  . Pulmonary arterial hypertension   . PAF (paroxysmal atrial fibrillation), 1st episode since DCCV in Jan/14 03/19/2013  . Ulcers of both lower extremities, small, now with cellulitis 06/05/2013     Assessment: 59 YOF with LLE cellulitis started on vancomycin and metronidazole yesterday.   Add ciprofloxacin today for broader coverage.  Patient has a solitary kidney with reduced renal function.   Goal of Therapy:  Clearance of infection   Plan:  - Cipro 400mg  IV Q24H - Continue vancomycin and metronidazole as ordered - Monitor renal fxn, clinical course - Watch INR as Cipro can further increases Coumadin's effect    Carl Bleecker D. Mina Marble, PharmD, BCPS Pager:  [phone] 09/10/2014, 5:22 PM

## 2014-09-10 NOTE — Progress Notes (Signed)
Patient with BPs remaining low.  Spoke with Dr. Jeffie Pollock- who suggested transfer to ICU for central line- CVP monitoring and possible pressors.  Appreciate Dr. Titus Mould taking patient over while in ICU  Eulogio Bear DO

## 2014-09-10 NOTE — Progress Notes (Signed)
ANTICOAGULATION CONSULT NOTE - Follow Up Consult  Pharmacy Consult for coumadin Indication: atrial fibrillation  Allergies  Allergen Reactions  . Ivp Dye [Iodinated Diagnostic Agents] Shortness Of Breath    Turn red, can't breathe  . Betadine [Povidone Iodine]   . Diphenhydramine Hcl Swelling    In hands and eyes  . Fish Allergy Nausea And Vomiting  . Iohexol      Desc: PT TURNS RED AND WHEEZING   . Penicillins Other (See Comments)    Resp arrest as child  . Povidone-Iodine Other (See Comments)    Wheezing, turn red, can't breath, and blisters  . Promethazine Hcl Other (See Comments)    Low Blood Pressure  . Tape     Blisters, can use paper tape for short periods  . Clindamycin Hives and Rash    wheezing  . Iodine Rash  . Morphine Sulfate Nausea And Vomiting and Rash  . Primaxin [Imipenem] Rash    Patient Measurements: Height: 5' 6.93" (170 cm) Weight: 335 lb 14.4 oz (152.363 kg) IBW/kg (Calculated) : 61.44   Vital Signs: Temp: 98.2 F (36.8 C) (09/11 0639) Temp src: Oral (09/11 0639) BP: 91/42 mmHg (09/11 0735) Pulse Rate: 96 (09/11 0735)  Labs:  Recent Labs  09/08/14 09/09/14 1820 09/09/14 2130 09/10/14 0410  HGB  --  9.6*  --  8.4*  HCT  --  29.5*  --  26.1*  PLT  --  251  --  214  LABPROT  --   --  34.3* 38.0*  INR 2.5  --  3.40* 3.87*  CREATININE  --  3.38*  --  3.66*    Estimated Creatinine Clearance: 23.3 ml/min (by C-G formula based on Cr of 3.66).   Assessment: Patient is a 64 y.o F on coumadin PTA for Afib.  INR increased from 3.40 to 3.87 today. Patient took his home dose PTA yesterday. Hgb down slightly to 8.4 this morning.  No bleeding documented.  Goal of Therapy:  INR 2-3    Plan:  1) hold coumadin today  Shamaria Kavan P 09/10/2014,8:22 AM

## 2014-09-10 NOTE — Progress Notes (Signed)
Pt states that she does not wear cpap at home and does not want it here. Encouraged pt to call if she changed her mind at anytime throughout the night, pt communicates understanding.

## 2014-09-10 NOTE — Progress Notes (Signed)
Patient npo Sugars 120s per RN Patient written for lantus 20u at bedtime her home dose - RN concerned this is too high  Plan Decrease lantus to 10U QHS

## 2014-09-10 NOTE — Clinical Social Work Psychosocial (Signed)
Clinical Social Work Department BRIEF PSYCHOSOCIAL ASSESSMENT 09/10/2014  Patient:  [name]     Account Number:  [id number]     Admit date:  09/09/2014  Clinical Social Worker:  Daiva Huge  Date/Time:  09/10/2014 11:30 AM  Referred by:  Physician  Date Referred:  09/10/2014 Referred for  SNF Placement   Other Referral:   Interview type:  Patient Other interview type:    PSYCHOSOCIAL DATA Living Status:  FAMILY Admitted from facility:   Level of care:   Primary support name:  HUSBAND Primary support relationship to patient:  FAMILY Degree of support available:   GOOD    CURRENT CONCERNS Current Concerns  Post-Acute Placement   Other Concerns:    SOCIAL WORK ASSESSMENT / PLAN Met with patient to discuss possible dc needs- she reports she was at [facility] SNF in the recent past- she returned home with her husband who works and states she was doing fairly well. She reports her legs are feeling "like rubber" and as long as she can regain her strength (PT pending). She is open to considering SNF if needed and at this time I will proceed with a SNF search in case this is needed.   Assessment/plan status:   Other assessment/ plan:   FL2 and PASARR for SNF search   Information/referral to community resources:   SNF list    PATIENT'S/FAMILY'S RESPONSE TO PLAN OF CARE: Patient agreeable to SNF search in case it is needed- CSW will begin SNF search in case this is needed- patient, however, is hopeful she can return home with [facility] at d/c.     Eduard Clos, MSW, [place]

## 2014-09-10 NOTE — Consult Note (Signed)
PULMONARY / CRITICAL CARE MEDICINE   Name: [name] MRN: [id number] DOB: 1950-07-24    ADMISSION DATE:  09/09/2014 CONSULTATION DATE:  9/11  REFERRING MD :  Eliseo Squires  CHIEF COMPLAINT:  Sepsis   INITIAL PRESENTATION:  64 y.o. female with history of fibromyalgia, hypothyroidism, solitary kidney, hypothyroidism, diabetes, nonischemic cardiomyopathy with ejection fraction of 35%, atrial fibrillation who was admitted on 9/10 w/ recurrent Lower extremity cellulitis. PCCM asked to see in consult on 9/11 for persistent hypotension and worsening renal failure.   STUDIES:  2 view left lower extremity tib/fib 9/11>>>  SIGNIFICANT EVENTS: 9/10: admitted to tele. Cultures obtained, ABX started.  9/11: PCCM asked to see for persist hypotension   HISTORY OF PRESENT ILLNESS:    64 y.o. female with history of fibromyalgia, hypothyroidism, solitary kidney, hypothyroidism, diabetes, nonischemic cardiomyopathy with ejection fraction of 35%, atrial fibrillation who presents the emergency department on 9/10 with left-sided lower leg pain.  Patient was recently hospitalized and treated for leg cellulitis in [facility] hospital. Then she had a rehabilitation for 2 weeks in early August. After that, she went home and has been doing okay until 5 days ago when patient started having worsening left leg pain and redness. She also had a fever with temperature 103. She had a flu shot on 09/03/14, which may have contributed to her fever per patient. She took oral doxycycline without significant help. Because of the worsening leg pain, she comes to the emergency room for further evaluation and treatment. She was admitted to the med service w/ working dx of cellulitis. Treated w/ IVFs and empiric antibiotics. PCCM asked to see on 9/11 for persistent hypotension and progressive renal failure.   PAST MEDICAL HISTORY :  Past Medical History  Diagnosis Date  . Personal history of other diseases of circulatory system   . Cardiac  pacemaker in situ 02/26/11    Medtronic Adapta  . Chronic lymphocytic thyroiditis   . Morbid obesity   . Other chronic pulmonary heart diseases   . Obstructive sleep apnea (adult) (pediatric)   . Other and unspecified hyperlipidemia   . Fibromyalgia   . Goiter   . Thyroiditis, autoimmune   . Hypothyroidism, acquired, autoimmune   . Solitary kidney, acquired   . Fatigue   . Hyperparathyroidism, secondary   . Vitamin D deficiency disease   . Hyperparathyroidism   . H/O varicose veins 03/27/12  . Type II or unspecified type diabetes mellitus without mention of complication, not stated as uncontrolled   . Diabetes mellitus type II   . Infections of kidney 03/27/12  . History of measles, mumps, or rubella 03/27/12  . Tubal pregnancy 12/23/76  . Unspecified essential hypertension     20 yrs ago  . Blood transfusion 1977    [facility]  . Complication of anesthesia 2010    hard to wake up after knee surgery  . Anemia   . Cardiomyopathy, nonischemic     EF 45 %  . Endometrial hyperplasia with atypia 05/05/12  . Diastolic HF (heart failure) 11/05/2012  . A-fib   . Echocardiogram abnormal 07/15/12    severe MR,mod to severe TR,mod to severe PA hypertension  . Pulmonary arterial hypertension   . PAF (paroxysmal atrial fibrillation), 1st episode since DCCV in Jan/14 03/19/2013  . Ulcers of both lower extremities, small, now with cellulitis 06/05/2013   Past Surgical History  Procedure Laterality Date  . Cholecystectomy    . Pacemaker placement  02/26/11    Medtronic Adapta  .  Knee surgery    . Resection duplicate left kidney    . 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 Hernia repair  1999    ruptured ;  . D& c with hysteroscopy & cervical polypectomy  05/05/12  . Cardioversion N/A 02/06/2013    Successful  . Iud removal  01/30/13  . Ectopic pregnancy surgery  1978  . Cardioversion N/A 06/08/2013    Procedure: CARDIOVERSION;  Surgeon: Sanda Klein, MD;  Location: [place];  Service:  Cardiovascular;  Laterality: N/A;  Room [phone]  . Cardioversion N/A 02/01/2014    Procedure: CARDIOVERSION;  Surgeon: Sanda Klein, MD;  Location: [place];  Service: Cardiovascular;  Laterality: N/A;  . Cardioversion N/A 06/02/2014    Procedure: CARDIOVERSION;  Surgeon: Sanda Klein, MD;  Location: [facility];  Service: Cardiovascular;  Laterality: N/A;   Prior to Admission medications   Medication Sig Start Date End Date Taking? Authorizing Provider  albuterol (PROVENTIL HFA;VENTOLIN HFA) 108 (90 BASE) MCG/ACT inhaler Inhale 2 puffs into the lungs every 6 (six) hours as needed for wheezing or shortness of breath.    Yes Historical Provider, MD  albuterol (PROVENTIL) (2.5 MG/3ML) 0.083% nebulizer solution Take 2.5 mg by nebulization every 6 (six) hours as needed for wheezing or shortness of breath.    Yes Historical Provider, MD  amiodarone (PACERONE) 200 MG tablet Take 2 tablets (400 mg total) by mouth 2 (two) times daily. 07/08/14  Yes Mihai Croitoru, MD  calcitRIOL (ROCALTROL) 0.25 MCG capsule Take 0.25 mcg by mouth 2 (two) times daily.   Yes Historical Provider, MD  calcium citrate-vitamin D 200-200 MG-UNIT TABS Take 1 tablet by mouth 4 (four) times daily.    Yes Historical Provider, MD  camphor-menthol Timoteo Ace) lotion Apply 1 application topically daily as needed for itching. 08/03/14  Yes Janece Canterbury, MD  collagenase (SANTYL) ointment Apply 1 application topically daily. Bilateral medial thigh ulcers. 08/03/14  Yes Janece Canterbury, MD  doxycycline (VIBRAMYCIN) 100 MG capsule Take 1 capsule (100 mg total) by mouth 2 (two) times daily. 09/02/14  Yes Elby Showers, MD  glyBURIDE (DIABETA) 5 MG tablet TAKE 1-2 TABLETS TWICE DAILY WITH A MEAL. TAKE 10MG  (2 TABS) IN THE A.M. AND 5MG (1 TAB) AT NIGHT. 07/01/14  Yes Sherrlyn Hock, MD  HYDROcodone-acetaminophen [facility]) 10-325 MG per tablet Take 1 tablet by mouth every 4 (four) hours as needed for moderate pain.  06/29/14  Yes Historical Provider, MD   hydrOXYzine (ATARAX/VISTARIL) 25 MG tablet Take 1 tablet (25 mg total) by mouth every 4 (four) hours as needed for itching. 08/03/14  Yes Janece Canterbury, MD  insulin glargine (LANTUS) 100 UNIT/ML injection Inject 27 Units into the skin at bedtime.  07/03/13  Yes Sherrlyn Hock, MD  iron polysaccharides (NIFEREX) 150 MG capsule Take 150 mg by mouth 2 (two) times daily.   Yes Historical Provider, MD  levothyroxine (SYNTHROID, LEVOTHROID) 125 MCG tablet Take 1 tablet (125 mcg total) by mouth daily before breakfast. 06/02/14  Yes Mihai Croitoru, MD  metolazone (ZAROXOLYN) 5 MG tablet Take 2.5 mg by mouth daily as needed (for weight gain). If up 4-5 lbs. 03/12/14  Yes Mihai Croitoru, MD  metoprolol tartrate (LOPRESSOR) 25 MG tablet Take 12.5 mg by mouth 2 (two) times daily. 08/03/14  Yes Janece Canterbury, MD  Multiple Vitamin (MULTIVITAMIN) tablet Take 1 tablet by mouth daily.    Yes Historical Provider, MD  mupirocin ointment (BACTROBAN) 2 % Apply topically 2 (two) times daily. Apply  to lower extremity ulcers not being treated with hydrotherapy 08/03/14  Yes Janece Canterbury, MD  nitroGLYCERIN (NITROSTAT) 0.4 MG SL tablet Place 0.4 mg under the tongue every 5 (five) minutes as needed. For chest pain   Yes Historical Provider, MD  saccharomyces boulardii (FLORASTOR) 250 MG capsule Take 250 mg by mouth 2 (two) times daily. 08/03/14  Yes Janece Canterbury, MD  sitaGLIPtin (JANUVIA) 100 MG tablet Take 100 mg by mouth daily.   Yes Historical Provider, MD  torsemide (DEMADEX) 100 MG tablet Take 50-100 mg by mouth 2 (two) times daily. 100 mg in the am and 50 mg in the pm 03/08/14  Yes Mihai Croitoru, MD  ULORIC 40 MG tablet Take 80 mg by mouth daily. Take two tablet 05/21/14  Yes Historical Provider, MD  Vitamin D, Ergocalciferol, (DRISDOL) 50000 UNITS CAPS capsule Take 50,000 Units by mouth 2 (two) times a week. Takes on Tuesdays and Fridays 03/09/14 03/10/15 Yes Sherrlyn Hock, MD  VOLTAREN 1 % GEL Apply 2 g topically 4  (four) times daily as needed (for pain).  03/11/14  Yes Historical Provider, MD  warfarin (COUMADIN) 5 MG tablet Take 2.5-5 mg by mouth See admin instructions. Takes 5 mg on Sunday, Tuesday, Thursday and Saturday and 2.5 mg all other days 01/20/14  Yes Tommy Medal, RPH-CPP  BD PEN NEEDLE NANO U/F 32G X 4 MM MISC USE AS DIRECTED 08/23/14   Sherrlyn Hock, MD  glucose blood (ONE TOUCH ULTRA TEST) test strip Test 3 times daily 03/09/14   Sherrlyn Hock, MD  Insulin Pen Needle (B-D UF III MINI PEN NEEDLES) 31G X 5 MM MISC Check blood sugar up to 6x daily 08/11/13   Sherrlyn Hock, MD   Allergies  Allergen Reactions  . Ivp Dye [Iodinated Diagnostic Agents] Shortness Of Breath    Turn red, can't breathe  . Betadine [Povidone Iodine]   . Diphenhydramine Hcl Swelling    In hands and eyes  . Fish Allergy Nausea And Vomiting  . Iohexol      Desc: PT TURNS RED AND WHEEZING   . Penicillins Other (See Comments)    Resp arrest as child  . Povidone-Iodine Other (See Comments)    Wheezing, turn red, can't breath, and blisters  . Promethazine Hcl Other (See Comments)    Low Blood Pressure  . Tape     Blisters, can use paper tape for short periods  . Clindamycin Hives and Rash    wheezing  . Iodine Rash  . Morphine Sulfate Nausea And Vomiting and Rash  . Primaxin [Imipenem] Rash    FAMILY HISTORY:  Family History  Problem Relation Age of Onset  . Cardiomyopathy Brother   . Diabetes Mother   . Cancer Maternal Grandmother   . Heart attack Mother    SOCIAL HISTORY:  reports that she has never smoked. She has never used smokeless tobacco. She reports that she does not drink alcohol or use illicit drugs. Review of Systems:   Bolds are positive  Constitutional: weight loss, gain, night sweats, Fevers, chills, fatigue .  HEENT: headaches, Sore throat, sneezing, nasal congestion, post nasal drip, Difficulty swallowing, Tooth/dental problems, visual complaints visual changes, ear ache CV:   chest pain, radiates: ,Orthopnea, PND, swelling in lower extremities, dizziness, palpitations, syncope.  GI  heartburn, indigestion, abdominal pain, nausea, vomiting, diarrhea, change in bowel habits, loss of appetite, bloody stools.  Resp: cough, productive: , hemoptysis, dyspnea, chest pain, pleuritic.  Skin: rash or itching or icterus.  Left > right Lower extremity cellulitis, painful , warm w/ increased swelling.  GU: dysuria, change in color of urine, urgency or frequency. flank pain, hematuria  MS: joint pain or swelling. decreased range of motion Psych: change in mood or affect. depression or anxiety.  Neuro: difficulty with speech, weakness, numbness, ataxia   SUBJECTIVE:  C/o leg pain   VITAL SIGNS: Temp:  [97.8 F (36.6 C)-98.2 F (36.8 C)] 98.2 F (36.8 C) (09/11 1300) Pulse Rate:  [83-103] 96 (09/11 1300) Resp:  [15-23] 20 (09/11 1300) BP: (70-94)/(37-57) 79/39 mmHg (09/11 1300) SpO2:  [92 %-99 %] 93 % (09/11 1300) FiO2 (%):  [28 %] 28 % (09/10 2125) Weight:  [137.9 kg (304 lb 0.2 oz)-152.363 kg (335 lb 14.4 oz)] 152.363 kg (335 lb 14.4 oz) (09/11 0102) HEMODYNAMICS:   VENTILATOR SETTINGS: Vent Mode:  [-]  FiO2 (%):  [28 %] 28 % INTAKE / OUTPUT:  Intake/Output Summary (Last 24 hours) at 09/10/14 1432 Last data filed at 09/10/14 1301  Gross per 24 hour  Intake   1300 ml  Output      0 ml  Net   1300 ml    PHYSICAL EXAMINATION: General:  Chronically ill appearing 64 year old female, appears much older than stated age  Neuro:  Awake, oriented, no focal def  HEENT:  MMM no JVD  Cardiovascular:  Regular irreg  Lungs:  Decreased in bases  Abdomen:  Obese, + bowel sounds  Musculoskeletal:  Generalized weakness  Skin:  Left lower extremity: swollen, erythremic w/ weeping, w/ several blistered areas. Also has dressing more proximal on thigh from wound sustained from fall which is identical to wound on right (bilateral thigh wounds)  LABS:  CBC  Recent Labs Lab  09/09/14 1820 09/10/14 0410  WBC 19.0* 18.9*  HGB 9.6* 8.4*  HCT 29.5* 26.1*  PLT 251 214   Coag's  Recent Labs Lab 09/08/14 09/09/14 2130 09/10/14 0410  INR 2.5 3.40* 3.87*   BMET  Recent Labs Lab 09/09/14 1820 09/10/14 0410  NA 136* 134*  K 4.9 5.2  CL 95* 95*  CO2 25 26  BUN 74* 77*  CREATININE 3.38* 3.66*  GLUCOSE 170* 172*   Electrolytes  Recent Labs Lab 09/09/14 1820 09/10/14 0410  CALCIUM 8.8 8.4   Sepsis Markers  Recent Labs Lab 09/09/14 1835 09/10/14 0410  LATICACIDVEN 3.16* 3.0*   ABG No results found for this basename: PHART, PCO2ART, PO2ART,  in the last 168 hours Liver Enzymes  Recent Labs Lab 09/10/14 0410  AST 45*  ALT 45*  ALKPHOS 138*  BILITOT 1.5*  ALBUMIN 1.6*   Cardiac Enzymes No results found for this basename: TROPONINI, PROBNP,  in the last 168 hours Glucose  Recent Labs Lab 09/09/14 2235 09/10/14 0655 09/10/14 1108  GLUCAP 155* 159* 147*    Imaging No results found.   ASSESSMENT / PLAN:  PULMONARY OETT A: OSA/OHS P:   Cont auto-set CPAP at HS  May need abg  CARDIOVASCULAR CVL A: Septic shock Atrial fib w/ CVR NICM EF 30-35% P:  Transfer to ICU EGDT protocol MAP goal >65 May need aline, cuff accuracy? CVP 8/12 Will use fluids and vasopressors as indicated.  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 Will need to watch INR very closely with flagyl and cipro both given at same time as coumadin.  May need ffp, need line as in shock,  F/u INR Transfuse for hgb < 7   INFECTIOUS A:   Left Lower extremity +/- right Lower extremity cellulitis Bilateral  thigh wounds Septic shock P:   BCx2 9/10>>> BBC:WUGQ 9/10>>> ABX flagyl 9/10>>> ABX cipro 9/11>>> Continue Santyl to 3 areas of thigh wounds as previously ordered by CCS team, and Bactroban to left calf wounds as previously ordered, according to patient Xray r/o gas, may need CT if worsen  ENDOCRINE A:   DM Hypothyroidism  R/o rel AI P:   ssi Cont synthroid supplementation  cortisol  NEUROLOGIC A:   No acute issues   P:   RASS goal: 0 Supportive care   TODAY'S SUMMARY: septic shock from LE cellulitis. Will need transfer to ICU. Will place CVL and start EGDT protocol   I have personally obtained a history, examined the patient, evaluated laboratory and imaging results, formulated the assessment and plan and placed orders. CRITICAL CARE: The patient is critically ill with multiple organ systems failure and requires high complexity decision making for assessment and support, frequent evaluation and titration of therapies, application of advanced monitoring technologies and extensive interpretation of multiple databases. Critical Care Time devoted to patient care services described in this note is 35 minutes.   Lavon Paganini. Titus Mould, MD, [place] Pgr: [facility]  Pulmonary and [place] Pager: [phone]  09/10/2014, 2:32 PM

## 2014-09-10 NOTE — Progress Notes (Signed)
PROGRESS NOTE  [name] DPO:[id number] DOB: [birth year] DOA: 09/09/2014 PCP: Elby Showers, MD  Assessment/Plan: Left lower leg cellulitis:  - Start on empiric vancomycin and Flagyl (patient is allergic to penicillin, not good candidate for Zosyn)  - Keep lower extremity elevated.  - blood culture X 2  - Wound care consult  - pt/ot   . Type 2 diabetes mellitus:  - A1c 7.0  - decrease home dose of Lantus from 27 to 20 due to poor oral intake  - SSI while inpatient.  - Follow CBGs   . PAF (paroxysmal atrial fibrillation)  - Currently with a paced rhythm, monitor in telemetry.  - Coumadin to be dosed by pharmacy.  - Continue home medications   . 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 Carefully monitor volume status. - Strict I&O's, daily weights.  -patient requested cards consult  . Hypothyroidism, acquired, autoimmune: Recent TSH was 2.12 on 05/28/14.  - Continue levothyroxine   Gout: stable -Continue home medication   . CKD (chronic kidney disease) stage 4, GFR 15-29 ml/min: Cre is slightly higher from baseline 2.5-3.00 to 3.38 on admission  - Monitor closely  - hold diuretics as above.   Marland Kitchen PACEMAKER, PERMANENT  - Monitor in telemetry.   . Obesity hypoventilation syndrome  - CPAP each bedtime   . OBESITY, MORBID   Code Status: full Family Communication: patient Disposition Plan:    Consultants:  cardiology  Procedures:      HPI/Subjective: Still not feeling well   Objective: Filed Vitals:   09/10/14 0735  BP: 91/42  Pulse: 96  Temp:   Resp:     Intake/Output Summary (Last 24 hours) at 09/10/14 1004 Last data filed at 09/10/14 0953  Gross per 24 hour  Intake    940 ml  Output      0 ml  Net    940 ml   Filed Weights   09/09/14 2125  09/10/14 0639  Weight: 137.9 kg (304 lb 0.2 oz) 152.363 kg (335 lb 14.4 oz)    Exam:   General:  morbidly obese, chronically ill appearing  Cardiovascular: irr  Respiratory: clear, no wheezing  Abdomen: +Bs, obese  Skin: left leg swollen and red- blisters intact appearing on left leg  Data Reviewed: Basic Metabolic Panel:  Recent Labs Lab 09/09/14 1820 09/10/14 0410  NA 136* 134*  K 4.9 5.2  CL 95* 95*  CO2 25 26  GLUCOSE 170* 172*  BUN 74* 77*  CREATININE 3.38* 3.66*  CALCIUM 8.8 8.4   Liver Function Tests:  Recent Labs Lab 09/10/14 0410  AST 45*  ALT 45*  ALKPHOS 138*  BILITOT 1.5*  PROT 6.2  ALBUMIN 1.6*   No results found for this basename: LIPASE, AMYLASE,  in the last 168 hours No results found for this basename: AMMONIA,  in the last 168 hours CBC:  Recent Labs Lab 09/09/14 1820 09/10/14 0410  WBC 19.0* 18.9*  NEUTROABS 18.4* 18.2*  HGB 9.6* 8.4*  HCT 29.5* 26.1*  MCV 98.0 96.0  PLT 251 214   Cardiac Enzymes: No results found for this basename: CKTOTAL, CKMB, CKMBINDEX, TROPONINI,  in the last 168 hours BNP (last 3 results) No results found for this basename: PROBNP,  in the last 8760 hours CBG:  Recent  Labs Lab 09/09/14 2235 09/10/14 0655  GLUCAP 155* 159*    Recent Results (from the past 240 hour(s))  CULTURE, BLOOD (ROUTINE X 2)     Status: None   Collection Time    09/09/14  6:20 PM      Result Value Ref Range Status   Specimen Description BLOOD RIGHT ARM   Final   Special Requests BOTTLES DRAWN AEROBIC AND ANAEROBIC 10CC   Final   Culture  Setup Time     Final   Value: 09/09/2014 22:28     Performed at [facility]   Culture     Final   Value:        BLOOD CULTURE RECEIVED NO GROWTH TO DATE CULTURE WILL BE HELD FOR 5 DAYS BEFORE ISSUING A FINAL NEGATIVE REPORT     Performed at [facility]   Report Status PENDING   Incomplete     Studies: No results found.  Scheduled Meds: . amiodarone  400 mg  Oral BID  . calcitRIOL  0.25 mcg Oral BID  . calcium-vitamin D  1 tablet Oral BID  . febuxostat  80 mg Oral Daily  . insulin aspart  0-15 Units Subcutaneous TID WC  . insulin glargine  20 Units Subcutaneous QHS  . iron polysaccharides  150 mg Oral BID  . levothyroxine  125 mcg Oral QAC breakfast  . metoprolol tartrate  12.5 mg Oral BID  . metronidazole  500 mg Intravenous Q8H  . multivitamin with minerals  1 tablet Oral Daily  . mupirocin ointment   Topical BID  . saccharomyces boulardii  250 mg Oral BID  . sodium chloride  3 mL Intravenous Q12H  . sodium chloride  3 mL Intravenous Q12H  . [START ON 09/11/2014] vancomycin  1,750 mg Intravenous Q48H  . Warfarin - Pharmacist Dosing Inpatient   Does not apply q1800   Continuous Infusions:  Antibiotics Given (last 72 hours)   Date/Time Action Medication Dose Rate   09/10/14 0007 Given   vancomycin (VANCOCIN) 1,500 mg in sodium chloride 0.9 % 500 mL IVPB 1,500 mg 250 mL/hr   09/10/14 0144 Given  [other fluid infusing (vancomycin)]   metroNIDAZOLE (FLAGYL) IVPB 500 mg 500 mg 100 mL/hr   09/10/14 0640 Given   metroNIDAZOLE (FLAGYL) IVPB 500 mg 500 mg 100 mL/hr      Principal Problem:   Cellulitis of lower leg Active Problems:   HYPERTENSION   PACEMAKER, PERMANENT   Type 2 diabetes mellitus   Chronic combined systolic and diastolic HF (heart failure)   Long term (current) use of anticoagulants   CKD (chronic kidney disease) stage 4, GFR 15-29 ml/min   Cellulitis   Left leg cellulitis    Time spent: 35 min    [name]  [facility] Pager [phone]. If 7PM-7AM, please contact night-coverage at [email or web address], password [facility] 09/10/2014, 10:04 AM  LOS: 1 day

## 2014-09-10 NOTE — Progress Notes (Signed)
OT Cancellation Note  Patient Details Name: [name] MRN: [id number] DOB: 28-Apr-1950   Cancelled Treatment:    Reason Eval/Treat Not Completed: Medical issues which prohibited therapy - low BP. Will reattempt  Darlina Rumpf Haven, OTR/L [phone]  09/10/2014, 2:40 PM

## 2014-09-10 NOTE — Progress Notes (Signed)
PT Cancellation Note  Patient Details Name: [name] MRN: [id number] DOB: 1950/01/31   Cancelled Treatment:    Reason Eval/Treat Not Completed: Medical issues which prohibited therapy (low BP)   Kase Shughart 09/10/2014, 2:44 PM

## 2014-09-10 NOTE — Clinical Social Work Placement (Addendum)
Clinical Social Work Department CLINICAL SOCIAL WORK PLACEMENT NOTE 09/10/2014  Patient:  [name],[name]  Account Number:  [id number] Admit date:  09/09/2014  Clinical Social Worker:  Daiva Huge  Date/time:  09/10/2014 12:29 PM  Clinical Social Work is seeking post-discharge placement for this patient at the following level of care:   [place]   (*CSW will update this form in Epic as items are completed)   09/10/2014  Patient/family provided with [place] Department of Clinical Social Work's list of facilities offering this level of care within the geographic area requested by the patient (or if unable, by the patient's family).  09/10/2014  Patient/family informed of their freedom to choose among providers that offer the needed level of care, that participate in Medicare, Medicaid or managed care program needed by the patient, have an available bed and are willing to accept the patient.  09/10/2014  Patient/family informed of [facility]' ownership interest in [facility], as well as of the fact that they are under no obligation to receive care at this facility.  PASARR submitted to EDS on 09/10/2014 PASARR number received on 09/10/2014  FL2 transmitted to all facilities in geographic area requested by pt/family on  09/10/2014 FL2 transmitted to all facilities within larger geographic area on 09/28/14  Patient informed that his/her managed care company has contracts with or will negotiate with  certain facilities, including the following:     Patient/family informed of bed offers received:   Patient chooses bed at  Physician recommends and patient chooses bed at    Patient to be transferred to  on   Patient to be transferred to facility by  Patient and family notified of transfer on  Name of family member notified:    The following physician request were entered in Epic:   Additional Comments:   Eduard Clos, MSW, [place]

## 2014-09-10 NOTE — Progress Notes (Addendum)
Dr Eliseo Squires paged, pt bp 79/39. Sherrie Mustache 1:29 PM   Will bolus pt per MD. Sherrie Mustache 1:45 PM

## 2014-09-10 NOTE — Consult Note (Signed)
WOC wound consult note Reason for Consult: Consult requested for bilat leg wounds.  Pt had debridement of bilat thigh wounds  and hydrotherapy by CCS team during the previous admission. They are following her as an outpatient and have ordered Santyl Q day.  Pt states that her legs became more swollen this week and she developed new areas of stasis ulcers to left calf. Wound type: Left thigh full thickness wound: 2X4X1cm, 50% yellow, 50% red, strong odor, mod amt yellow drainage. Right thigh full thickness 1X2X1cm, 70% red, 30% yellow, small amt yellow drainage, strong odor. Right upper thigh:  Full thickness .3X1cm, 100% yellow slough, small amt yellow drainage, no odor. Measurement: Left outer calf with partial thickness stasis ulcers; 1X1.5cm 1X2cm, both are dry red and scabbed without odor or drainage. Left outer leg: 2X5X.1cm red and moist with mod amt yellow drainage, no odor. Dressing procedure/placement/frequency: Pt states she is allergic to tape and can only use Tegaderm.   Continue Santyl to 3 areas of thigh wounds as previously ordered by CCS team, and Bactroban to left calf wounds as previously ordered, according to patient. She can resume follow-up with this team after discharge. Pt has been changing her own dressings at home and can perform dressing changes while in the hospital if desired. Please re-consult if further assistance is needed.  Thank-you,  Julien Girt MSN, [place], [place], [place], [place]

## 2014-09-11 DIAGNOSIS — I4891 Unspecified atrial fibrillation: Secondary | ICD-10-CM

## 2014-09-11 LAB — GLUCOSE, CAPILLARY
GLUCOSE-CAPILLARY: 95 mg/dL (ref 70–99)
Glucose-Capillary: 115 mg/dL — ABNORMAL HIGH (ref 70–99)
Glucose-Capillary: 89 mg/dL (ref 70–99)
Glucose-Capillary: 94 mg/dL (ref 70–99)
Glucose-Capillary: 139 mg/dL — ABNORMAL HIGH (ref 70–99)
Glucose-Capillary: 129 mg/dL — ABNORMAL HIGH (ref 70–99)
Glucose-Capillary: 161 mg/dL — ABNORMAL HIGH (ref 70–99)

## 2014-09-11 LAB — BASIC METABOLIC PANEL
Anion gap: 14 (ref 5–15)
BUN: 80 mg/dL — AB (ref 6–23)
CALCIUM: 8.9 mg/dL (ref 8.4–10.5)
CO2: 24 meq/L (ref 19–32)
CREATININE: 3.99 mg/dL — AB (ref 0.50–1.10)
Chloride: 94 mEq/L — ABNORMAL LOW (ref 96–112)
GFR calc Af Amer: 12 mL/min — ABNORMAL LOW (ref >90)
GFR, EST NON AFRICAN AMERICAN: 11 mL/min — AB (ref >90)
GLUCOSE: 79 mg/dL (ref 70–99)
Potassium: 5.1 mEq/L (ref 3.7–5.3)
Sodium: 132 mEq/L — ABNORMAL LOW (ref 137–147)

## 2014-09-11 LAB — CBC
HEMATOCRIT: 30.4 % — AB (ref 36.0–46.0)
Hemoglobin: 9.7 g/dL — ABNORMAL LOW (ref 12.0–15.0)
MCH: 31.6 pg (ref 26.0–34.0)
MCHC: 31.9 g/dL (ref 30.0–36.0)
MCV: 99 fL (ref 78.0–100.0)
PLATELETS: 371 10*3/uL (ref 150–400)
RBC: 3.07 MIL/uL — ABNORMAL LOW (ref 3.87–5.11)
RDW: 20.1 % — AB (ref 11.5–15.5)
WBC: 34.1 10*3/uL — AB (ref 4.0–10.5)

## 2014-09-11 LAB — PROTIME-INR
INR: 4.19 — ABNORMAL HIGH (ref 0.00–1.49)
Prothrombin Time: 40.4 seconds — ABNORMAL HIGH (ref 11.6–15.2)

## 2014-09-11 LAB — CORTISOL: Cortisol, Plasma: 29 ug/dL

## 2014-09-11 MED ORDER — HYDROCORTISONE NA SUCCINATE PF 100 MG IJ SOLR
50.0000 mg | Freq: Four times a day (QID) | INTRAMUSCULAR | Status: DC
Start: 2014-09-11 — End: 2014-09-11
  Administered 2014-09-11: 50 mg via INTRAVENOUS
  Filled 2014-09-11 (×4): qty 1

## 2014-09-11 MED ORDER — LEVOFLOXACIN IN D5W 500 MG/100ML IV SOLN
500.0000 mg | Freq: Once | INTRAVENOUS | Status: AC
  Administered 2014-09-11: 500 mg via INTRAVENOUS
  Filled 2014-09-11: qty 100

## 2014-09-11 MED ORDER — LEVOFLOXACIN IN D5W 500 MG/100ML IV SOLN: 500.0000 mg | INTRAVENOUS | Status: DC

## 2014-09-11 MED ORDER — INSULIN ASPART 100 UNIT/ML ~~LOC~~ SOLN
0.0000 [IU] | Freq: Three times a day (TID) | SUBCUTANEOUS | Status: DC
Start: 2014-09-12 — End: 2014-09-14
  Administered 2014-09-12: 2 [IU] via SUBCUTANEOUS
  Administered 2014-09-12: 3 [IU] via SUBCUTANEOUS
  Administered 2014-09-12: 2 [IU] via SUBCUTANEOUS
  Administered 2014-09-13: 3 [IU] via SUBCUTANEOUS
  Administered 2014-09-13: 5 [IU] via SUBCUTANEOUS
  Administered 2014-09-13: 3 [IU] via SUBCUTANEOUS
  Administered 2014-09-14: 5 [IU] via SUBCUTANEOUS

## 2014-09-11 MED ORDER — SODIUM CHLORIDE 0.9 % IV BOLUS (SEPSIS)
500.0000 mL | Freq: Once | INTRAVENOUS | Status: AC
  Administered 2014-09-11: 500 mL via INTRAVENOUS

## 2014-09-11 NOTE — Progress Notes (Signed)
ANTICOAGULATION CONSULT NOTE - Follow Up Consult  Pharmacy Consult for coumadin Indication: atrial fibrillation  Allergies  Allergen Reactions  . Ivp Dye [Iodinated Diagnostic Agents] Shortness Of Breath    Turn red, can't breathe  . Betadine [Povidone Iodine]   . Diphenhydramine Hcl Swelling    In hands and eyes  . Fish Allergy Nausea And Vomiting  . Iohexol      Desc: PT TURNS RED AND WHEEZING   . Penicillins Other (See Comments)    Resp arrest as child  . Povidone-Iodine Other (See Comments)    Wheezing, turn red, can't breath, and blisters  . Promethazine Hcl Other (See Comments)    Low Blood Pressure  . Tape     Blisters, can use paper tape for short periods  . Clindamycin Hives and Rash    wheezing  . Iodine Rash  . Morphine Sulfate Nausea And Vomiting and Rash  . Primaxin [Imipenem] Rash    Patient Measurements: Height: 5' 6.93" (170 cm) Weight: 379 lb 3.1 oz (172 kg) IBW/kg (Calculated) : 61.44  Vital Signs: Temp: 98.1 F (36.7 C) (09/12 1158) Temp src: Oral (09/12 1158) BP: 106/53 mmHg (09/12 1130) Pulse Rate: 81 (09/12 1130)  Labs:  Recent Labs  09/09/14 1820 09/09/14 2130 09/10/14 0410 09/10/14 1715 09/11/14 0355  HGB 9.6*  --  8.4*  --  9.7*  HCT 29.5*  --  26.1*  --  30.4*  PLT 251  --  214  --  371  LABPROT  --  34.3* 38.0*  --  40.4*  INR  --  3.40* 3.87*  --  4.19*  CREATININE 3.38*  --  3.66* 3.87* 3.99*    Estimated Creatinine Clearance: 23.1 ml/min (by C-G formula based on Cr of 3.99).   Medications:  Scheduled:  . amiodarone  400 mg Oral BID  . calcitRIOL  0.25 mcg Oral BID  . calcium-vitamin D  1 tablet Oral BID  . collagenase   Topical Daily  . febuxostat  80 mg Oral Daily  . hydrocortisone sodium succinate  50 mg Intravenous Q6H  . insulin aspart  0-15 Units Subcutaneous 6 times per day  . insulin glargine  10 Units Subcutaneous QHS  . iron polysaccharides  150 mg Oral BID  . [START ON 09/13/2014] levofloxacin (LEVAQUIN)  IV  500 mg Intravenous Q48H  . levothyroxine  125 mcg Oral QAC breakfast  . multivitamin with minerals  1 tablet Oral Daily  . mupirocin cream   Topical Daily  . mupirocin ointment   Topical BID  . saccharomyces boulardii  250 mg Oral BID  . sodium chloride  3 mL Intravenous Q12H  . vancomycin  1,750 mg Intravenous Q48H  . Warfarin - Pharmacist Dosing Inpatient   Does not apply q1800    Assessment: 64 yo female on coumadin PTA for afib.  Patient now in the ICU with hypotension and worsening renal failure (noted with solitary kidney) and now on antibiotics for septic shock/cellulitis. INR is above goal at 4.19, hg= 9.7 and plt= 371  Goal of Therapy:  INR 2-3 Monitor platelets by anticoagulation protocol: Yes   Plan:  -Hold coumadin  -Daily PT/INR  Hildred Laser, Pharm D 09/11/2014 12:56 PM

## 2014-09-11 NOTE — Progress Notes (Signed)
CRITICAL VALUE ALERT  Critical value received:  Aerobic bottle gram+cocci in pairs  Date of notification:  09/10/14  Time of notification:  2145  Critical value read back:yes  Nurse who received alert:  ASperry RN  MD notified (1st page):  Ramaswamy MD  Time of first page:  2200  MD notified (2nd page):  Time of second page:  Responding MD:  Chase Caller MD  Time MD responded:  2200

## 2014-09-11 NOTE — Progress Notes (Signed)
ANTIBIOTIC CONSULT NOTE - FOLLOW UP  Pharmacy Consult for levaquin Indication: Cellulitis  Allergies  Allergen Reactions  . Ivp Dye [Iodinated Diagnostic Agents] Shortness Of Breath    Turn red, can't breathe  . Betadine [Povidone Iodine]   . Diphenhydramine Hcl Swelling    In hands and eyes  . Fish Allergy Nausea And Vomiting  . Iohexol      Desc: PT TURNS RED AND WHEEZING   . Penicillins Other (See Comments)    Resp arrest as child  . Povidone-Iodine Other (See Comments)    Wheezing, turn red, can't breath, and blisters  . Promethazine Hcl Other (See Comments)    Low Blood Pressure  . Tape     Blisters, can use paper tape for short periods  . Clindamycin Hives and Rash    wheezing  . Iodine Rash  . Morphine Sulfate Nausea And Vomiting and Rash  . Primaxin [Imipenem] Rash    Patient Measurements: Height: 5' 6.93" (170 cm) Weight: 379 lb 3.1 oz (172 kg) IBW/kg (Calculated) : 61.44  Vital Signs: Temp: 98.3 F (36.8 C) (09/12 0748) Temp src: Oral (09/12 0748) BP: 91/49 mmHg (09/12 0500) Pulse Rate: 99 (09/11 2300) Intake/Output from previous day: 09/11 0701 - 09/12 0700 In: 1620.3 [P.O.:600; I.V.:620.3; IV Piggyback:400] Out: 525 [Urine:525] Intake/Output from this shift:    Labs:  Recent Labs  09/09/14 1820 09/10/14 0410 09/10/14 1715 09/11/14 0355  WBC 19.0* 18.9*  --  34.1*  HGB 9.6* 8.4*  --  9.7*  PLT 251 214  --  371  CREATININE 3.38* 3.66* 3.87* 3.99*   Estimated Creatinine Clearance: 23.1 ml/min (by C-G formula based on Cr of 3.99). No results found for this basename: VANCOTROUGH, Corlis Leak, VANCORANDOM, [place], GENTPEAK, GENTRANDOM, TOBRATROUGH, TOBRAPEAK, TOBRARND, AMIKACINPEAK, AMIKACINTROU, AMIKACIN,  in the last 72 hours   Microbiology: Recent Results (from the past 720 hour(s))  CULTURE, BLOOD (ROUTINE X 2)     Status: None   Collection Time    09/09/14  6:20 PM      Result Value Ref Range Status   Specimen Description BLOOD RIGHT  ARM   Final   Special Requests BOTTLES DRAWN AEROBIC AND ANAEROBIC 10CC   Final   Culture  Setup Time     Final   Value: 09/09/2014 22:28     Performed at [facility]   Culture     Final   Value: [place] IN PAIRS     Note: Gram Stain Report Called to,Read Back By and Verified With: ALICIA SPERRY ON 5/73/2202 AT 9:04P BY WILEJ     Performed at [facility]   Report Status PENDING   Incomplete  MRSA PCR SCREENING     Status: None   Collection Time    09/10/14  4:02 PM      Result Value Ref Range Status   MRSA by PCR NEGATIVE  NEGATIVE Final   Comment:            The GeneXpert MRSA Assay (FDA     approved for NASAL specimens     only), is one component of a     comprehensive MRSA colonization     surveillance program. It is not     intended to diagnose MRSA     infection nor to guide or     monitor treatment for     MRSA infections.    Anti-infectives   Start     Dose/Rate Route Frequency Ordered Stop  09/13/14 1000  levofloxacin (LEVAQUIN) IVPB 500 mg     500 mg 100 mL/hr over 60 Minutes Intravenous Every 48 hours 09/11/14 0958     09/11/14 2300  vancomycin (VANCOCIN) 1,750 mg in sodium chloride 0.9 % 500 mL IVPB     1,750 mg 250 mL/hr over 120 Minutes Intravenous Every 48 hours 09/09/14 2158     09/11/14 1800  ciprofloxacin (CIPRO) IVPB 400 mg  Status:  Discontinued     400 mg 200 mL/hr over 60 Minutes Intravenous Every 24 hours 09/10/14 1723 09/11/14 0913   09/11/14 1030  levofloxacin (LEVAQUIN) IVPB 500 mg     500 mg 100 mL/hr over 60 Minutes Intravenous  Once 09/11/14 0958     09/10/14 1645  ciprofloxacin (CIPRO) IVPB 400 mg     400 mg 200 mL/hr over 60 Minutes Intravenous STAT 09/10/14 1640 09/10/14 1844   09/09/14 2300  metroNIDAZOLE (FLAGYL) IVPB 500 mg  Status:  Discontinued     500 mg 100 mL/hr over 60 Minutes Intravenous Every 8 hours 09/09/14 2124 09/11/14 0915   09/09/14 2200  vancomycin (VANCOCIN) 1,500 mg in sodium chloride 0.9  % 500 mL IVPB     1,500 mg 250 mL/hr over 120 Minutes Intravenous  Once 09/09/14 2158 09/10/14 0207   09/09/14 1800  vancomycin (VANCOCIN) IVPB 1000 mg/200 mL premix     1,000 mg 200 mL/hr over 60 Minutes Intravenous  Once 09/09/14 1754 09/09/14 2042      Assessment: 64 YOF with septic shock on phenylephrine and also with LLE cellulitis started on vancomycin 9/10. Cipro started 9/11 and to change to levaquin today.  WBC= 34.1, afeb, SCr= 3.99 (trend up), CrCl ~20 (Patient has a solitary kidney).  Vanc 9/10 >> Flagyl 9/10 >> 9/12 Cipro 9/11 >> 9/12 levaquin 9/12>>  9/10 bcx x2>>   GPC/pairs 1/2 9/11 urine   Goal of Therapy:  Vancomycin trough level 15-20 mcg/ml  Plan:  -Levaquin 500mg  IV q48hr -No vancomycin changes today -Will follow renal function, cultures and clinical progress  Hildred Laser, Pharm D 09/11/2014 10:26 AM

## 2014-09-11 NOTE — Consult Note (Signed)
PULMONARY / CRITICAL CARE MEDICINE   Name: [name] MRN: [id number] DOB: 1950/09/08    ADMISSION DATE:  09/09/2014 CONSULTATION DATE:  9/11  REFERRING MD :  Eliseo Squires  CHIEF COMPLAINT:  Sepsis   INITIAL PRESENTATION:  64 y.o. female with history of fibromyalgia, hypothyroidism, solitary kidney, hypothyroidism, diabetes, nonischemic cardiomyopathy with ejection fraction of 35%, atrial fibrillation who was admitted on 9/10 w/ recurrent Lower extremity cellulitis. PCCM asked to see in consult on 9/11 for persistent hypotension and worsening renal failure.   STUDIES:  2 view left lower extremity tib/fib 9/11>>>no gas, edema  SIGNIFICANT EVENTS: 9/10: admitted to tele. Cultures obtained, ABX started.  9/11: PCCM asked to see for persist hypotension  9/12- remain in shock  SUBJECTIVE:  Remain sin shock Pain reported  VITAL SIGNS: Temp:  [98.1 F (36.7 C)-98.8 F (37.1 C)] 98.3 F (36.8 C) (09/12 0748) Pulse Rate:  [85-101] 99 (09/11 2300) Resp:  [16-25] 18 (09/12 0500) BP: (75-102)/(26-73) 91/49 mmHg (09/12 0500) SpO2:  [86 %-99 %] 86 % (09/11 2300) Weight:  [172 kg (379 lb 3.1 oz)] 172 kg (379 lb 3.1 oz) (09/12 0500) HEMODYNAMICS: CVP:  [5 mmHg-10 mmHg] 10 mmHg VENTILATOR SETTINGS:   INTAKE / OUTPUT:  Intake/Output Summary (Last 24 hours) at 09/11/14 0904 Last data filed at 09/11/14 0500  Gross per 24 hour  Intake 1620.25 ml  Output    525 ml  Net 1095.25 ml    PHYSICAL EXAMINATION: General:  No distress Neuro:  Awake, oriented, no focal def  HEENT:  Line wnl  Cardiovascular:  s1 s2 IRR paced Lungs:  Decreased in bases , no crackles Abdomen:  Obese, + bowel sounds  Musculoskeletal:  Generalized weakness  Skin:  Left lower extremity: swollen, erythremic w/ weeping, w/ several blistered areas. No changes   LABS:  CBC  Recent Labs Lab 09/09/14 1820 09/10/14 0410 09/11/14 0355  WBC 19.0* 18.9* 34.1*  HGB 9.6* 8.4* 9.7*  HCT 29.5* 26.1* 30.4*  PLT 251  214 371   Coag's  Recent Labs Lab 09/09/14 2130 09/10/14 0410 09/11/14 0355  INR 3.40* 3.87* 4.19*   BMET  Recent Labs Lab 09/10/14 0410 09/10/14 1715 09/11/14 0355  NA 134* 134* 132*  K 5.2 5.1 5.1  CL 95* 95* 94*  CO2 26 25 24   BUN 77* 80* 80*  CREATININE 3.66* 3.87* 3.99*  GLUCOSE 172* 108* 79   Electrolytes  Recent Labs Lab 09/10/14 0410 09/10/14 1715 09/11/14 0355  CALCIUM 8.4 8.5 8.9   Sepsis Markers  Recent Labs Lab 09/09/14 1835 09/10/14 0410 09/10/14 1715  LATICACIDVEN 3.16* 3.0* 2.8*   ABG No results found for this basename: PHART, PCO2ART, PO2ART,  in the last 168 hours Liver Enzymes  Recent Labs Lab 09/10/14 0410 09/10/14 1715  AST 45* 57*  ALT 45* 49*  ALKPHOS 138* 164*  BILITOT 1.5* 1.6*  ALBUMIN 1.6* 1.6*   Cardiac Enzymes No results found for this basename: TROPONINI, PROBNP,  in the last 168 hours Glucose  Recent Labs Lab 09/10/14 1108 09/10/14 1643 09/10/14 2042 09/10/14 2354 09/11/14 0429 09/11/14 0725  GLUCAP 147* 124* 123* 115* 89 95    Imaging Dg Chest Port 1 View  09/10/2014   CLINICAL DATA:  Line placement  EXAM: PORTABLE CHEST - 1 VIEW  COMPARISON:  Portable exam 6203 hr compared to 06/03/2013  FINDINGS: RIGHT subclavian pacemaker leads project over RIGHT atrium and RIGHT ventricle, unchanged.  New LEFT subclavian central venous catheter with tip projecting over mid  to distal SVC.  Enlargement of cardiac silhouette with slight pulmonary vascular congestion.  No gross infiltrate, pleural effusion or pneumothorax.  Bones unremarkable.  IMPRESSION: No pneumothorax following central line placement.  Enlargement of cardiac silhouette with pulmonary vascular congestion.   Electronically Signed   By: Lavonia Dana M.D.   On: 09/10/2014 17:05   Dg Tibia/fibula Left Port  09/10/2014   CLINICAL DATA:  Edema and cellulitis  EXAM: PORTABLE LEFT TIBIA AND FIBULA - 2 VIEW  COMPARISON:  None.  FINDINGS: Frontal and lateral views  were obtained. There is marked soft tissue swelling. No fracture or dislocation. No erosive change or bony destruction. There is no evidence of soft tissue air.  IMPRESSION: Diffuse soft tissue edema/swelling. No soft tissue air is seen. No erosive change or bony destruction. No fracture or dislocation.  If there remains concern for potential soft tissue abscess for subtle air, CT would be the imaging study of choice to further assess.   Electronically Signed   By: Lowella Grip M.D.   On: 09/10/2014 17:07     ASSESSMENT / PLAN:  PULMONARY OETT A: OSA/OHS P:   Cont auto-set CPAP at HS  pcxr in am for fluid edema risk  CARDIOVASCULAR CVL A: Septic shock Atrial fib w/ CVR NICM EF 30-35% Moderate known pulm htn P:  EGDT protocol, completing MAP goal >65-60 May need aline, cuff accuracy? 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 Holding, may need vit k, re assess am coags May need ffp, no bleeding as of now F/u INR Transfuse for hgb < 7   INFECTIOUS A:   Left Lower extremity +/- right Lower extremity cellulitis Bilateral thigh wounds Septic shock P:   BCx2 9/10>>>GPC pairs>>> NLG:XQJJ 9/10>>> ABX flagyl 9/10>>> ABX cipro 9/11>>>9/12 Levofloxacin 9/12>>> Continue Santyl to 3 areas of thigh wounds as previously ordered by CCS team, and Bactroban to left calf wounds as previously ordered, according to patient Xray reassuring, no CT required at this stage May need echo, pending org  ID  ENDOCRINE A:   DM Hypothyroidism  R/o rel AI P:   ssi Cont synthroid supplementation  Cortisol awaited, starting stress roids epiric  NEUROLOGIC A:   No acute issues   P:   RASS goal: 0 Supportive care   TODAY'S SUMMARY: remains on presors, pos BC, repeat bc in am , biolus to cvp 14, feed  I have personally obtained a history, examined the patient, evaluated laboratory and imaging results, formulated the assessment and plan and placed orders. CRITICAL CARE: The patient is critically ill with multiple organ systems failure and requires high complexity decision making for assessment and support, frequent evaluation and titration of therapies, application of advanced monitoring technologies and extensive interpretation of multiple databases. Critical Care Time devoted to patient care services described in this note is 30 minutes.   Lavon Paganini. Titus Mould, [place] Pgr: [facility]  Pulmonary and Critical  [place] Pager: [phone]  09/11/2014, 9:04 AM

## 2014-09-11 NOTE — Progress Notes (Addendum)
Subjective:   Moved to ICU yesterday for hypotension and septic shock. On neo. Cx+ for group B strep  Denies dyspnea. Renal function worse. CVP now 14.   Intake/Output Summary (Last 24 hours) at 09/11/14 1542 Last data filed at 09/11/14 1400  Gross per 24 hour  Intake 2120.65 ml  Output    925 ml  Net 1195.65 ml    Current meds: . amiodarone  400 mg Oral BID  . calcitRIOL  0.25 mcg Oral BID  . calcium-vitamin D  1 tablet Oral BID  . collagenase   Topical Daily  . febuxostat  80 mg Oral Daily  . insulin aspart  0-15 Units Subcutaneous 6 times per day  . insulin glargine  10 Units Subcutaneous QHS  . iron polysaccharides  150 mg Oral BID  . [START ON 09/13/2014] levofloxacin (LEVAQUIN) IV  500 mg Intravenous Q48H  . levothyroxine  125 mcg Oral QAC breakfast  . multivitamin with minerals  1 tablet Oral Daily  . mupirocin cream   Topical Daily  . mupirocin ointment   Topical BID  . saccharomyces boulardii  250 mg Oral BID  . sodium chloride  3 mL Intravenous Q12H  . vancomycin  1,750 mg Intravenous Q48H  . Warfarin - Pharmacist Dosing Inpatient   Does not apply q1800   Infusions: . phenylephrine (NEO-SYNEPHRINE) Adult infusion 85 mcg/min (09/11/14 1300)     Objective:  Blood pressure 111/58, pulse 83, temperature 98.1 F (36.7 C), temperature source Oral, resp. rate 18, height 5' 6.93" (1.7 m), weight 172 kg (379 lb 3.1 oz), SpO2 97.00%. Weight change: 34.1 kg (75 lb 2.8 oz)  Physical Exam: General appearance: chronically ill appearing Neck: no carotid bruit  Lungs: clear to auscultation bilaterally  Heart: irregularly irregular rhythm  Abdomen: obese  Extremities: massive bilateral LE edema with diffuse errythema and blisters on her LLE  Pulses: diminnished  Skin: pale, cool, dry  Neurologic: Grossly normal  Telemetry: AF 80s  Lab Results: Basic Metabolic Panel:  Recent Labs Lab 09/09/14 1820 09/10/14 0410 09/10/14 1715 09/11/14 0355  NA 136* 134*  134* 132*  K 4.9 5.2 5.1 5.1  CL 95* 95* 95* 94*  CO2 25 26 25 24   GLUCOSE 170* 172* 108* 79  BUN 74* 77* 80* 80*  CREATININE 3.38* 3.66* 3.87* 3.99*  CALCIUM 8.8 8.4 8.5 8.9   Liver Function Tests:  Recent Labs Lab 09/10/14 0410 09/10/14 1715  AST 45* 57*  ALT 45* 49*  ALKPHOS 138* 164*  BILITOT 1.5* 1.6*  PROT 6.2 6.2  ALBUMIN 1.6* 1.6*   No results found for this basename: LIPASE, AMYLASE,  in the last 168 hours No results found for this basename: AMMONIA,  in the last 168 hours CBC:  Recent Labs Lab 09/09/14 1820 09/10/14 0410 09/11/14 0355  WBC 19.0* 18.9* 34.1*  NEUTROABS 18.4* 18.2*  --   HGB 9.6* 8.4* 9.7*  HCT 29.5* 26.1* 30.4*  MCV 98.0 96.0 99.0  PLT 251 214 371   Cardiac Enzymes: No results found for this basename: CKTOTAL, CKMB, CKMBINDEX, TROPONINI,  in the last 168 hours BNP: No components found with this basename: POCBNP,  CBG:  Recent Labs Lab 09/10/14 2042 09/10/14 2354 09/11/14 0429 09/11/14 0725 09/11/14 1138  GLUCAP 123* 115* 89 95 94   Microbiology: Lab Results  Component Value Date   CULT  Value:        BLOOD CULTURE RECEIVED NO GROWTH TO DATE CULTURE WILL BE HELD FOR 5 DAYS  BEFORE ISSUING A FINAL NEGATIVE REPORT Performed at [facility] 09/09/2014   CULT  Value: GROUP B STREP(S.AGALACTIAE)ISOLATED Note: Gram Stain Report Called to,Read Back By and Verified With: ALICIA SPERRY ON 9/48/5462 AT 9:04P BY WILEJ Performed at [facility] 09/09/2014   CULT  Value: NO GROWTH 2 DAYS Performed at [facility] 07/31/2014   CULT  Value: NO GROWTH 5 DAYS Performed at [facility] 07/27/2014   CULT  Value: NO GROWTH 5 DAYS Performed at [facility] 07/27/2014    Recent Labs Lab 09/09/14 1820 09/09/14 1835  CULT GROUP B STREP(S.AGALACTIAE)ISOLATED Note: Gram Stain Report Called to,Read Back By and Verified With: ALICIA SPERRY ON 07/02/5008 AT 9:04P BY WILEJ Performed at [facility] NO GROWTH TO DATE CULTURE WILL BE HELD FOR 5 DAYS BEFORE ISSUING A FINAL NEGATIVE REPORT Performed at [facility] BLOOD ARM RIGHT    Imaging: Dg Chest Port 1 View  09/10/2014   CLINICAL DATA:  Line placement  EXAM: PORTABLE CHEST - 1 VIEW  COMPARISON:  Portable exam 3818 hr compared to 06/03/2013  FINDINGS: RIGHT subclavian pacemaker leads project over RIGHT atrium and RIGHT ventricle, unchanged.  New LEFT subclavian central venous catheter with tip projecting over mid to distal SVC.  Enlargement of cardiac silhouette with slight pulmonary vascular congestion.  No gross infiltrate, pleural effusion or pneumothorax.  Bones unremarkable.  IMPRESSION: No pneumothorax following central line placement.  Enlargement of cardiac silhouette with pulmonary vascular congestion.   Electronically Signed   By: Lavonia Dana M.D.   On: 09/10/2014 17:05   Dg Tibia/fibula Left Port  09/10/2014   CLINICAL DATA:  Edema and cellulitis  EXAM: PORTABLE LEFT TIBIA AND FIBULA - 2 VIEW  COMPARISON:  None.  FINDINGS: Frontal and lateral views were obtained. There is marked soft tissue swelling. No fracture or dislocation. No erosive change or bony destruction. There is no evidence of soft tissue air.  IMPRESSION: Diffuse soft tissue edema/swelling. No soft tissue air is seen. No erosive change or bony destruction. No fracture or dislocation.  If there remains concern for potential soft tissue abscess for subtle air, CT would be the imaging study of choice to further assess.   Electronically Signed   By: Lowella Grip M.D.   On: 09/10/2014 17:07     ASSESSMENT:  1. Septic shock 2. Cellulitis 3. Chronic systolic HF EF 29-93% 4. Pulmonary HTN with cor pulmonale 5. A/C renal failure, stage 4 with single kidney 6. Morbid obesity 7. PAF - failed recent DC-CV.   PLAN/DISCUSSION:  CCM managing septic shock. Volume status and renal function worse. But BP improved. Making urine  now. Hopefully can avoid CVVHD. If need stronger pressor would use norepi.   Remains in AF. Once more stable may consider adding ranexa to [facility] and repeat DC-CV.    LOS: 2 days    Glori Bickers, MD 09/11/2014, 3:42 PM

## 2014-09-12 ENCOUNTER — Inpatient Hospital Stay (HOSPITAL_COMMUNITY): Payer: Medicare Other

## 2014-09-12 DIAGNOSIS — I059 Rheumatic mitral valve disease, unspecified: Secondary | ICD-10-CM

## 2014-09-12 DIAGNOSIS — G4733 Obstructive sleep apnea (adult) (pediatric): Secondary | ICD-10-CM

## 2014-09-12 DIAGNOSIS — E662 Morbid (severe) obesity with alveolar hypoventilation: Secondary | ICD-10-CM

## 2014-09-12 LAB — CBC WITH DIFFERENTIAL/PLATELET
Basophils Absolute: 0 10*3/uL (ref 0.0–0.1)
Basophils Relative: 0 % (ref 0–1)
Eosinophils Absolute: 0 10*3/uL (ref 0.0–0.7)
Eosinophils Relative: 0 % (ref 0–5)
HCT: 28 % — ABNORMAL LOW (ref 36.0–46.0)
Hemoglobin: 9.1 g/dL — ABNORMAL LOW (ref 12.0–15.0)
LYMPHS PCT: 1 % — AB (ref 12–46)
Lymphs Abs: 0.3 10*3/uL — ABNORMAL LOW (ref 0.7–4.0)
MCH: 31.5 pg (ref 26.0–34.0)
MCHC: 32.5 g/dL (ref 30.0–36.0)
MCV: 96.9 fL (ref 78.0–100.0)
Monocytes Absolute: 0.8 10*3/uL (ref 0.1–1.0)
Monocytes Relative: 3 % (ref 3–12)
Neutro Abs: 23.8 10*3/uL — ABNORMAL HIGH (ref 1.7–7.7)
Neutrophils Relative %: 96 % — ABNORMAL HIGH (ref 43–77)
Platelets: 281 10*3/uL (ref 150–400)
RBC: 2.89 MIL/uL — ABNORMAL LOW (ref 3.87–5.11)
RDW: 19.5 % — ABNORMAL HIGH (ref 11.5–15.5)
WBC: 24.9 10*3/uL — AB (ref 4.0–10.5)

## 2014-09-12 LAB — PROTIME-INR
INR: 5 — AB (ref 0.00–1.49)
PROTHROMBIN TIME: 46.4 s — AB (ref 11.6–15.2)

## 2014-09-12 LAB — COMPREHENSIVE METABOLIC PANEL
ALT: 43 U/L — AB (ref 0–35)
AST: 55 U/L — AB (ref 0–37)
Albumin: 1.4 g/dL — ABNORMAL LOW (ref 3.5–5.2)
Alkaline Phosphatase: 192 U/L — ABNORMAL HIGH (ref 39–117)
Anion gap: 12 (ref 5–15)
BILIRUBIN TOTAL: 1.8 mg/dL — AB (ref 0.3–1.2)
BUN: 82 mg/dL — ABNORMAL HIGH (ref 6–23)
CHLORIDE: 95 meq/L — AB (ref 96–112)
CO2: 26 mEq/L (ref 19–32)
Calcium: 8.4 mg/dL (ref 8.4–10.5)
Creatinine, Ser: 3.53 mg/dL — ABNORMAL HIGH (ref 0.50–1.10)
GFR calc Af Amer: 14 mL/min — ABNORMAL LOW (ref >90)
GFR calc non Af Amer: 12 mL/min — ABNORMAL LOW (ref >90)
Glucose, Bld: 140 mg/dL — ABNORMAL HIGH (ref 70–99)
Potassium: 4.3 mEq/L (ref 3.7–5.3)
SODIUM: 133 meq/L — AB (ref 137–147)
Total Protein: 5.9 g/dL — ABNORMAL LOW (ref 6.0–8.3)

## 2014-09-12 LAB — CULTURE, BLOOD (ROUTINE X 2)

## 2014-09-12 LAB — APTT: aPTT: 68 seconds — ABNORMAL HIGH (ref 24–37)

## 2014-09-12 LAB — GLUCOSE, CAPILLARY
GLUCOSE-CAPILLARY: 146 mg/dL — AB (ref 70–99)
GLUCOSE-CAPILLARY: 150 mg/dL — AB (ref 70–99)
GLUCOSE-CAPILLARY: 233 mg/dL — AB (ref 70–99)
Glucose-Capillary: 167 mg/dL — ABNORMAL HIGH (ref 70–99)

## 2014-09-12 MED ORDER — VASOPRESSIN 20 UNIT/ML IJ SOLN
0.0300 [IU]/min | INTRAVENOUS | Status: DC
  Filled 2014-09-12: qty 2

## 2014-09-12 MED ORDER — HYDROCORTISONE NA SUCCINATE PF 100 MG IJ SOLR
50.0000 mg | Freq: Four times a day (QID) | INTRAMUSCULAR | Status: DC
  Administered 2014-09-12 – 2014-09-13 (×5): 50 mg via INTRAVENOUS
  Filled 2014-09-12 (×9): qty 1

## 2014-09-12 MED ORDER — HYDROCODONE-ACETAMINOPHEN 10-325 MG PO TABS
1.0000 | ORAL_TABLET | Freq: Four times a day (QID) | ORAL | Status: DC | PRN
  Administered 2014-09-12 – 2014-09-17 (×12): 1 via ORAL
  Filled 2014-09-12 (×12): qty 1

## 2014-09-12 MED ORDER — VITAMIN K1 10 MG/ML IJ SOLN
1.0000 mg | Freq: Once | INTRAVENOUS | Status: AC
  Administered 2014-09-12: 1 mg via INTRAVENOUS
  Filled 2014-09-12: qty 0.1

## 2014-09-12 NOTE — Progress Notes (Signed)
Bed offers left at pt's bedside for review.  RN aware and will give to pt's sister when she comes back to hospital.

## 2014-09-12 NOTE — Progress Notes (Signed)
ANTICOAGULATION CONSULT NOTE - Follow Up Consult  Pharmacy Consult for coumadin Indication: atrial fibrillation  Allergies  Allergen Reactions  . Ivp Dye [Iodinated Diagnostic Agents] Shortness Of Breath    Turn red, can't breathe  . Betadine [Povidone Iodine]   . Diphenhydramine Hcl Swelling    In hands and eyes  . Fish Allergy Nausea And Vomiting  . Iohexol      Desc: PT TURNS RED AND WHEEZING   . Penicillins Other (See Comments)    Resp arrest as child  . Povidone-Iodine Other (See Comments)    Wheezing, turn red, can't breath, and blisters  . Promethazine Hcl Other (See Comments)    Low Blood Pressure  . Tape     Blisters, can use paper tape for short periods  . Clindamycin Hives and Rash    wheezing  . Iodine Rash  . Morphine Sulfate Nausea And Vomiting and Rash  . Primaxin [Imipenem] Rash    Patient Measurements: Height: 5' 6.93" (170 cm) Weight: 379 lb 3.1 oz (172 kg) IBW/kg (Calculated) : 61.44  Vital Signs: Temp: 97.9 F (36.6 C) (09/13 1211) Temp src: Oral (09/13 1211) BP: 96/80 mmHg (09/13 1220) Pulse Rate: 101 (09/13 1220)  Labs:  Recent Labs  09/10/14 0410 09/10/14 1715 09/11/14 0355 09/12/14 0530  HGB 8.4*  --  9.7* 9.1*  HCT 26.1*  --  30.4* 28.0*  PLT 214  --  371 281  APTT  --   --   --  68*  LABPROT 38.0*  --  40.4* 46.4*  INR 3.87*  --  4.19* 5.00*  CREATININE 3.66* 3.87* 3.99* 3.53*    Estimated Creatinine Clearance: 26.1 ml/min (by C-G formula based on Cr of 3.53).   Medications:  Scheduled:  . amiodarone  400 mg Oral BID  . calcitRIOL  0.25 mcg Oral BID  . calcium-vitamin D  1 tablet Oral BID  . collagenase   Topical Daily  . febuxostat  80 mg Oral Daily  . hydrocortisone sodium succinate  50 mg Intravenous Q6H  . insulin aspart  0-15 Units Subcutaneous TID WC  . insulin glargine  10 Units Subcutaneous QHS  . iron polysaccharides  150 mg Oral BID  . [START ON 09/13/2014] levofloxacin (LEVAQUIN) IV  500 mg Intravenous Q48H   . levothyroxine  125 mcg Oral QAC breakfast  . multivitamin with minerals  1 tablet Oral Daily  . mupirocin cream   Topical Daily  . mupirocin ointment   Topical BID  . saccharomyces boulardii  250 mg Oral BID  . sodium chloride  3 mL Intravenous Q12H  . vancomycin  1,750 mg Intravenous Q48H  . Warfarin - Pharmacist Dosing Inpatient   Does not apply q1800    Assessment: 64 yo [name] on coumadin PTA for afib.  Patient now in the ICU with hypotension and worsening renal failure (noted with solitary kidney) and now on antibiotics for septic shock/cellulitis. INR is above goal at 5 (noted on antibiotics), hg= 9.1 and plt= 281. AST/ALT= 55/43, t. Bili= 1.8.  Goal of Therapy:  INR 2-3 Monitor platelets by anticoagulation protocol: Yes   Plan:  -Hold coumadin  -Daily PT/INR  Hildred Laser, Pharm D 09/12/2014 12:29 PM

## 2014-09-12 NOTE — Progress Notes (Signed)
PULMONARY / CRITICAL CARE MEDICINE   Name: [name] MRN: [id number] DOB: 03/05/1950    ADMISSION DATE:  09/09/2014 CONSULTATION DATE:  9/11  REFERRING MD :  Eliseo Squires  CHIEF COMPLAINT:  Sepsis   INITIAL PRESENTATION:  64 y.o. female with history of fibromyalgia, hypothyroidism, solitary kidney, hypothyroidism, diabetes, nonischemic cardiomyopathy with ejection fraction of 35%, atrial fibrillation who was admitted on 9/10 w/ recurrent Lower extremity cellulitis. PCCM asked to see in consult on 9/11 for persistent hypotension and worsening renal failure.   STUDIES:  2 view left lower extremity tib/fib 9/11>>>no gas, edema  SIGNIFICANT EVENTS: 9/10: admitted to tele. Cultures obtained, ABX started.  9/11: PCCM asked to see for persist hypotension  9/12- remain in shock  SUBJECTIVE:  Remain sin shock on neo, pos balance  VITAL SIGNS: Temp:  [97.5 F (36.4 C)-98.8 F (37.1 C)] 97.8 F (36.6 C) (09/13 0750) Pulse Rate:  [69-94] 74 (09/13 0600) Resp:  [12-29] 19 (09/13 0600) BP: (57-148)/(33-124) 96/50 mmHg (09/13 0600) SpO2:  [96 %-100 %] 99 % (09/13 0600) HEMODYNAMICS: CVP:  [12 mmHg-14 mmHg] 12 mmHg VENTILATOR SETTINGS:   INTAKE / OUTPUT:  Intake/Output Summary (Last 24 hours) at 09/12/14 0815 Last data filed at 09/12/14 0600  Gross per 24 hour  Intake 2138.22 ml  Output   1795 ml  Net 343.22 ml    PHYSICAL EXAMINATION: General:  No distress Neuro:  Awake, oriented, no focal def  HEENT:  Line wnl  Cardiovascular:  s1 s2 IRR paced Lungs:  Reduced throughout Abdomen:  Obese, + bowel sounds  Musculoskeletal:  Generalized weakness  Skin:  Left lower extremity: swollen, erythremic w/ weeping, w/ several blistered areas. No changes   LABS:  CBC  Recent Labs Lab 09/10/14 0410 09/11/14 0355 09/12/14 0530  WBC 18.9* 34.1* 24.9*  HGB 8.4* 9.7* 9.1*  HCT 26.1* 30.4* 28.0*  PLT 214 371 281   Coag's  Recent Labs Lab 09/10/14 0410 09/11/14 0355  09/12/14 0530  APTT  --   --  68*  INR 3.87* 4.19* 5.00*   BMET  Recent Labs Lab 09/10/14 1715 09/11/14 0355 09/12/14 0530  NA 134* 132* 133*  K 5.1 5.1 4.3  CL 95* 94* 95*  CO2 25 24 26   BUN 80* 80* 82*  CREATININE 3.87* 3.99* 3.53*  GLUCOSE 108* 79 140*   Electrolytes  Recent Labs Lab 09/10/14 1715 09/11/14 0355 09/12/14 0530  CALCIUM 8.5 8.9 8.4   Sepsis Markers  Recent Labs Lab 09/09/14 1835 09/10/14 0410 09/10/14 1715  LATICACIDVEN 3.16* 3.0* 2.8*   ABG No results found for this basename: PHART, PCO2ART, PO2ART,  in the last 168 hours Liver Enzymes  Recent Labs Lab 09/10/14 0410 09/10/14 1715 09/12/14 0530  AST 45* 57* 55*  ALT 45* 49* 43*  ALKPHOS 138* 164* 192*  BILITOT 1.5* 1.6* 1.8*  ALBUMIN 1.6* 1.6* 1.4*   Cardiac Enzymes No results found for this basename: TROPONINI, PROBNP,  in the last 168 hours Glucose  Recent Labs Lab 09/11/14 0725 09/11/14 1138 09/11/14 1616 09/11/14 1828 09/11/14 2228 09/12/14 0722  GLUCAP 95 94 139* 129* 161* 146*    Imaging No results found.   ASSESSMENT / PLAN:  PULMONARY OETT A: OSA/OHS No evidence edema P:   Cont auto-set CPAP at HS  pcxr in am for fluid development Allow pos balance  CARDIOVASCULAR CVL A: Septic shock Atrial fib w/ CVR NICM EF 30-35% Moderate known pulm htn P:  MAP goal >55 with a good MS  and urine output 15 -20 cc/hr Given mod pA htn, goal cvp 14, last 12, no eema on pcxr, allow pos balance, no role lasix Cortisol sent?, empiric given stress roids, cant send cortisol now on stress hydro Maintain amio With continued neo, ad dvaso, most Septic pt with vaso def  RENAL A:  Acute on chronic renal failure  hyponatremia      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 Holding Dose low vit k , coags in am  F/u INR in am  Transfuse for hgb < 7   INFECTIOUS A:   Left Lower extremity +/- right Lower extremity cellulitis Bilateral thigh wounds Septic shock P:   BCx2 9/10>>>GPC pairs>>>group B strep agalactai BSJ:GGEZ 9/10>>> ABX flagyl 9/10>>>9/12 ABX cipro 9/11>>>9/12 Levofloxacin 9/12>>> Continue Santyl to 3 areas of thigh wounds as previously ordered by CCS team, and Bactroban to left calf wounds as previously ordered, according to patient  Making some clinical progress, no role CT and this is NOT group A strep likely can dc vanc, will see if micro even does sens to agailactai  ENDOCRINE A:   DM controlled Hypothyroidism  R/o rel AI P:   ssi Cont synthroid supplementation  committed to stress roids empiric, cortisol sent?  NEUROLOGIC A:   No acute issues   P:   RASS goal: 0 Supportive care  Likely pt once off neo  TODAY'S SUMMARY: vaso added, should not be an issue with CHF as she is not overloaded and pos balance is goal, wean pressors then  I have personally obtained a history, examined the patient, evaluated laboratory and imaging results, formulated the assessment and plan and placed orders. CRITICAL CARE: The patient is critically ill with multiple organ systems failure and requires high complexity decision making for assessment and support, frequent evaluation and titration of therapies, application of advanced monitoring technologies and extensive interpretation of multiple databases. Critical Care Time devoted to patient care services described in this note is 30 minutes.   Lavon Paganini. Titus Mould, MD, [place] Pgr: [facility]  Pulmonary and [place] Pager: [phone]  09/12/2014, 8:15 AM

## 2014-09-12 NOTE — Progress Notes (Signed)
ANTIBIOTIC CONSULT NOTE - FOLLOW UP  Pharmacy Consult for levaquin, vancomycin Indication: Cellulitis  Allergies  Allergen Reactions  . Ivp Dye [Iodinated Diagnostic Agents] Shortness Of Breath    Turn red, can't breathe  . Betadine [Povidone Iodine]   . Diphenhydramine Hcl Swelling    In hands and eyes  . Fish Allergy Nausea And Vomiting  . Iohexol      Desc: PT TURNS RED AND WHEEZING   . Penicillins Other (See Comments)    Resp arrest as child  . Povidone-Iodine Other (See Comments)    Wheezing, turn red, can't breath, and blisters  . Promethazine Hcl Other (See Comments)    Low Blood Pressure  . Tape     Blisters, can use paper tape for short periods  . Clindamycin Hives and Rash    wheezing  . Iodine Rash  . Morphine Sulfate Nausea And Vomiting and Rash  . Primaxin [Imipenem] Rash    Patient Measurements: Height: 5' 6.93" (170 cm) Weight: 379 lb 3.1 oz (172 kg) IBW/kg (Calculated) : 61.44  Vital Signs: Temp: 98.1 F (36.7 C) (09/13 1606) Temp src: Oral (09/13 1606) BP: 92/50 mmHg (09/13 1545) Pulse Rate: 79 (09/13 1545) Intake/Output from previous day: 09/12 0701 - 09/13 0700 In: 2229.5 [P.O.:240; I.V.:889.5; IV Piggyback:1100] Out: 1895 [Urine:1895] Intake/Output from this shift: Total I/O In: 655 [P.O.:480; I.V.:125; IV Piggyback:50] Out: 650 [Urine:650]  Labs:  Recent Labs  09/10/14 0410 09/10/14 1715 09/11/14 0355 09/12/14 0530  WBC 18.9*  --  34.1* 24.9*  HGB 8.4*  --  9.7* 9.1*  PLT 214  --  371 281  CREATININE 3.66* 3.87* 3.99* 3.53*   Estimated Creatinine Clearance: 26.1 ml/min (by C-G formula based on Cr of 3.53). No results found for this basename: VANCOTROUGH, Corlis Leak, VANCORANDOM, GENTTROUGH, GENTPEAK, GENTRANDOM, TOBRATROUGH, TOBRAPEAK, TOBRARND, AMIKACINPEAK, AMIKACINTROU, AMIKACIN,  in the last 72 hours   Microbiology: Recent Results (from the past 720 hour(s))  CULTURE, BLOOD (ROUTINE X 2)     Status: None   Collection Time     09/09/14  6:20 PM      Result Value Ref Range Status   Specimen Description BLOOD RIGHT ARM   Final   Special Requests BOTTLES DRAWN AEROBIC AND ANAEROBIC 10CC   Final   Culture  Setup Time     Final   Value: 09/09/2014 22:28     Performed at [facility]   Culture     Final   Value: GROUP B STREP(S.AGALACTIAE)ISOLATED     Note: Gram Stain Report Called to,Read Back By and Verified With: ALICIA SPERRY ON 1/60/7371 AT 9:04P BY WILEJ     Performed at [facility]   Report Status 09/12/2014 FINAL   Final   Organism ID, Bacteria GROUP B STREP(S.AGALACTIAE)ISOLATED   Final  CULTURE, BLOOD (ROUTINE X 2)     Status: None   Collection Time    09/09/14  6:35 PM      Result Value Ref Range Status   Specimen Description BLOOD ARM RIGHT   Final   Special Requests BOTTLES DRAWN AEROBIC AND ANAEROBIC 0.2CC   Final   Culture  Setup Time     Final   Value: 09/10/2014 01:00     Performed at [facility]   Culture     Final   Value:        BLOOD CULTURE RECEIVED NO GROWTH TO DATE CULTURE WILL BE HELD FOR 5 DAYS BEFORE ISSUING A FINAL NEGATIVE REPORT  Performed at [facility]   Report Status PENDING   Incomplete  MRSA PCR SCREENING     Status: None   Collection Time    09/10/14  4:02 PM      Result Value Ref Range Status   MRSA by PCR NEGATIVE  NEGATIVE Final   Comment:            The GeneXpert MRSA Assay (FDA     approved for NASAL specimens     only), is one component of a     comprehensive MRSA colonization     surveillance program. It is not     intended to diagnose MRSA     infection nor to guide or     monitor treatment for     MRSA infections.  URINE CULTURE     Status: None   Collection Time    09/10/14  4:02 PM      Result Value Ref Range Status   Specimen Description URINE, CATHETERIZED   Final   Special Requests Normal   Final   Culture  Setup Time     Final   Value: 09/10/2014 17:04     Performed at [facility]  PENDING   Incomplete   Culture     Final   Value: Culture reincubated for better growth     Performed at [facility]   Report Status PENDING   Incomplete    Anti-infectives   Start     Dose/Rate Route Frequency Ordered Stop   09/13/14 1000  levofloxacin (LEVAQUIN) IVPB 500 mg  Status:  Discontinued     500 mg 100 mL/hr over 60 Minutes Intravenous Every 48 hours 09/11/14 0958 09/12/14 1640   09/11/14 2300  vancomycin (VANCOCIN) 1,750 mg in sodium chloride 0.9 % 500 mL IVPB     1,750 mg 250 mL/hr over 120 Minutes Intravenous Every 48 hours 09/09/14 2158     09/11/14 1800  ciprofloxacin (CIPRO) IVPB 400 mg  Status:  Discontinued     400 mg 200 mL/hr over 60 Minutes Intravenous Every 24 hours 09/10/14 1723 09/11/14 0913   09/11/14 1030  levofloxacin (LEVAQUIN) IVPB 500 mg     500 mg 100 mL/hr over 60 Minutes Intravenous  Once 09/11/14 0958 09/11/14 1141   09/10/14 1645  ciprofloxacin (CIPRO) IVPB 400 mg     400 mg 200 mL/hr over 60 Minutes Intravenous STAT 09/10/14 1640 09/10/14 1844   09/09/14 2300  metroNIDAZOLE (FLAGYL) IVPB 500 mg  Status:  Discontinued     500 mg 100 mL/hr over 60 Minutes Intravenous Every 8 hours 09/09/14 2124 09/11/14 0915   09/09/14 2200  vancomycin (VANCOCIN) 1,500 mg in sodium chloride 0.9 % 500 mL IVPB     1,500 mg 250 mL/hr over 120 Minutes Intravenous  Once 09/09/14 2158 09/10/14 0207   09/09/14 1800  vancomycin (VANCOCIN) IVPB 1000 mg/200 mL premix     1,000 mg 200 mL/hr over 60 Minutes Intravenous  Once 09/09/14 1754 09/09/14 2042      Assessment: 64 YOF with septic shock now  off pressors and also with LLE cellulitis started on vancomycin 9/10.  WBC= 24.9, afeb, SCr= 3.53 , CrCl ~25 (Patient has a solitary kidney). Blood cultures noted with strep agalactiae resistant to levaquin. Discussed with Dr. Titus Mould and to treat with vancomycin due PCN allergy (respiratory arrest as child).  Vanc 9/10 >> Flagyl 9/10 >> 9/12 Cipro 9/11 >>  9/12 levaquin 9/12>>  9/10 bcx x2>> GBS In 1/2 (intermed LQ, sens PCN, vanc, ampicillin) 9/11 urine- pending  Goal of Therapy:  Vancomycin trough level 15-20 mcg/ml  Plan:  -Discontinue levaquin and continue vancomycin -Will follow renal function, cultures and clinical progress  Hildred Laser, Pharm D 09/12/2014 4:42 PM

## 2014-09-12 NOTE — Progress Notes (Signed)
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 Lower pressor requirements, but still in shock Improved UO and serum creat level Remains alert and oriented AF with controlled rate and intermittent RV pacing  CURRENT MEDS . amiodarone  400 mg Oral BID  . calcitRIOL  0.25 mcg Oral BID  . calcium-vitamin D  1 tablet Oral BID  . collagenase   Topical Daily  . febuxostat  80 mg Oral Daily  . hydrocortisone sodium succinate  50 mg Intravenous Q6H  . insulin aspart  0-15 Units Subcutaneous TID WC  . insulin glargine  10 Units Subcutaneous QHS  . iron polysaccharides  150 mg Oral BID  . [START ON 09/13/2014] levofloxacin (LEVAQUIN) IV  500 mg Intravenous Q48H  . levothyroxine  125 mcg Oral QAC breakfast  . multivitamin with minerals  1 tablet Oral Daily  . mupirocin cream   Topical Daily  . mupirocin ointment   Topical BID  . phytonadione (VITAMIN K) IV  1 mg Intravenous Once  . saccharomyces boulardii  250 mg Oral BID  . sodium chloride  3 mL Intravenous Q12H  . vancomycin  1,750 mg Intravenous Q48H  . Warfarin - Pharmacist Dosing Inpatient   Does not apply q1800    OBJECTIVE   Intake/Output Summary (Last 24 hours) at 09/12/14 1110 Last data filed at 09/12/14  1100  Gross per 24 hour  Intake 1514.42 ml  Output   2095 ml  Net -580.58 ml   Filed Weights   09/09/14 2125 09/10/14 0639 09/11/14 0500  Weight: 304 lb 0.2 oz (137.9 kg) 335 lb 14.4 oz (152.363 kg) 379 lb 3.1 oz (172 kg)    PHYSICAL EXAM Filed Vitals:   09/12/14 1000 09/12/14 1015 09/12/14 1030 09/12/14 1045  BP: 84/63 100/56 87/47 111/54  Pulse: 81 82 81 77  Temp:      TempSrc:      Resp: 19 25 15 26   Height:      Weight:      SpO2: 99% 100% 98% 99%   General: Alert, oriented x3, no distress Head: no evidence of trauma, PERRL, EOMI, no exophtalmos or lid lag, no myxedema, no xanthelasma; normal ears, nose and oropharynx Neck: normal jugular venous pulsations and no hepatojugular reflux; brisk carotid pulses without delay and no carotid bruits Chest: clear to auscultation, no signs of consolidation by percussion or palpation, normal fremitus, symmetrical and full respiratory excursions Cardiovascular: normal position and quality of the apical impulse, regular rhythm, normal first and second heart sounds, no rubs or gallops, 2/6 murmur Abdomen: no tenderness or distention, no masses by palpation, no abnormal pulsatility or arterial bruits, normal bowel sounds, no hepatosplenomegaly Extremities: no clubbing, cyanosis or edema; 2+ radial, ulnar  and brachial pulses bilaterally; 2+ right femoral, posterior tibial and dorsalis pedis pulses; 2+ left femoral, posterior tibial and dorsalis pedis pulses; no subclavian or femoral bruits Neurological: grossly nonfocal  LABS  CBC  Recent Labs  09/10/14 0410 09/11/14 0355 09/12/14 0530  WBC 18.9* 34.1* 24.9*  NEUTROABS 18.2*  --  23.8*  HGB 8.4* 9.7* 9.1*  HCT 26.1* 30.4* 28.0*  MCV 96.0 99.0 96.9  PLT 214 371 841   Basic Metabolic Panel  Recent Labs  09/11/14 0355 09/12/14 0530  NA 132* 133*  K 5.1 4.3  CL 94* 95*  CO2 24 26  GLUCOSE 79 140*  BUN 80* 82*  CREATININE 3.99* 3.53*  CALCIUM 8.9 8.4   Liver Function  Tests  Recent Labs  09/10/14 1715 09/12/14 0530  AST 57* 55*  ALT 49* 43*  ALKPHOS 164* 192*  BILITOT 1.6* 1.8*  PROT 6.2 5.9*  ALBUMIN 1.6* 1.4*   No results found for this basename: LIPASE, AMYLASE,  in the last 72 hours  Radiology Studies Imaging results have been reviewed and Dg Chest Port 1 View  09/12/2014   CLINICAL DATA:  Evaluate pulmonary edema.  EXAM: PORTABLE CHEST - 1 VIEW  COMPARISON:  Chest x-ray 09/10/2014.  FINDINGS: There is a left-sided internal jugular central venous catheter with tip terminating in the mid superior vena cava. Right-sided pacemaker device in position with lead tips projecting over the expected location of the right atrium and right ventricular apex. Lung volumes are normal. No consolidative airspace disease. No pleural effusions. Cephalization of the pulmonary vasculature, without frank pulmonary edema. Moderate cardiomegaly. The patient is rotated to the right on today's exam, resulting in distortion of the mediastinal contours and reduced diagnostic sensitivity and specificity for mediastinal pathology. Atherosclerosis in the thoracic aorta.  IMPRESSION: 1. Support apparatus, as above. 2. Cardiomegaly with pulmonary venous congestion, but no frank pulmonary edema. 3. Atherosclerosis.   Electronically Signed   By: Vinnie Langton M.D.   On: 09/12/2014 08:25   Dg Chest Port 1 View  09/10/2014   CLINICAL DATA:  Line placement  EXAM: PORTABLE CHEST - 1 VIEW  COMPARISON:  Portable exam 3244 hr compared to 06/03/2013  FINDINGS: RIGHT subclavian pacemaker leads project over RIGHT atrium and RIGHT ventricle, unchanged.  New LEFT subclavian central venous catheter with tip projecting over mid to distal SVC.  Enlargement of cardiac silhouette with slight pulmonary vascular congestion.  No gross infiltrate, pleural effusion or pneumothorax.  Bones unremarkable.  IMPRESSION: No pneumothorax following central line placement.  Enlargement of cardiac silhouette with  pulmonary vascular congestion.   Electronically Signed   By: Lavonia Dana M.D.   On: 09/10/2014 17:05   Dg Tibia/fibula Left Port  09/10/2014   CLINICAL DATA:  Edema and cellulitis  EXAM: PORTABLE LEFT TIBIA AND FIBULA - 2 VIEW  COMPARISON:  None.  FINDINGS: Frontal and lateral views were obtained. There is marked soft tissue swelling. No fracture or dislocation. No erosive change or bony destruction. There is no evidence of soft tissue air.  IMPRESSION: Diffuse soft tissue edema/swelling. No soft tissue air is seen. No erosive change or bony destruction. No fracture or dislocation.  If there remains concern for potential soft tissue abscess for subtle air, CT would be the imaging study of choice to further assess.   Electronically Signed   By: Lowella Grip M.D.   On: 09/10/2014 17:07    TELE AF, occ. V paced  ECG Ordered  ECHO Just performed, will review  ASSESSMENT AND PLAN  Group B strep bacteriemia and sepsis with cutaneous source Persistent atrial fibrillation Septic shock Severe nonischemic cardiomyopathy, likely valvular etiology Severe mitral insufficiency, longstanding Severe tricuspid insufficiency Morbid obesity Solitary kidney Acute on chronic renal failure Shock liver DM with complications  As she weans off pressors, gradually resume metoprolol to avoid AF with RVR INR is markedly supratherapeutic and will take a while to come back down due to antibiotics and liver dysfunction Suspicion for endocarditis is low as her illness was hyperacute   Sanda Klein, MD, [facility] [phone] office [phone] pager 09/12/2014 11:10 AM

## 2014-09-12 NOTE — Progress Notes (Signed)
*  PRELIMINARY RESULTS* Echocardiogram 2D Echocardiogram has been performed.  [name] 09/12/2014, 9:32 AM

## 2014-09-13 ENCOUNTER — Inpatient Hospital Stay (HOSPITAL_COMMUNITY): Payer: Medicare Other

## 2014-09-13 DIAGNOSIS — I509 Heart failure, unspecified: Secondary | ICD-10-CM

## 2014-09-13 DIAGNOSIS — N189 Chronic kidney disease, unspecified: Secondary | ICD-10-CM

## 2014-09-13 DIAGNOSIS — I5043 Acute on chronic combined systolic (congestive) and diastolic (congestive) heart failure: Secondary | ICD-10-CM

## 2014-09-13 DIAGNOSIS — L03119 Cellulitis of unspecified part of limb: Secondary | ICD-10-CM

## 2014-09-13 DIAGNOSIS — I428 Other cardiomyopathies: Secondary | ICD-10-CM

## 2014-09-13 DIAGNOSIS — N179 Acute kidney failure, unspecified: Secondary | ICD-10-CM

## 2014-09-13 DIAGNOSIS — L02419 Cutaneous abscess of limb, unspecified: Secondary | ICD-10-CM

## 2014-09-13 DIAGNOSIS — R7881 Bacteremia: Secondary | ICD-10-CM

## 2014-09-13 LAB — CBC WITH DIFFERENTIAL/PLATELET
BASOS ABS: 0 10*3/uL (ref 0.0–0.1)
Basophils Relative: 0 % (ref 0–1)
EOS PCT: 0 % (ref 0–5)
Eosinophils Absolute: 0 10*3/uL (ref 0.0–0.7)
HCT: 26.3 % — ABNORMAL LOW (ref 36.0–46.0)
Hemoglobin: 8.7 g/dL — ABNORMAL LOW (ref 12.0–15.0)
LYMPHS PCT: 2 % — AB (ref 12–46)
Lymphs Abs: 0.3 10*3/uL — ABNORMAL LOW (ref 0.7–4.0)
MCH: 31 pg (ref 26.0–34.0)
MCHC: 33.1 g/dL (ref 30.0–36.0)
MCV: 93.6 fL (ref 78.0–100.0)
Monocytes Absolute: 0.4 10*3/uL (ref 0.1–1.0)
Monocytes Relative: 2 % — ABNORMAL LOW (ref 3–12)
NEUTROS ABS: 14.6 10*3/uL — AB (ref 1.7–7.7)
Neutrophils Relative %: 96 % — ABNORMAL HIGH (ref 43–77)
PLATELETS: 226 10*3/uL (ref 150–400)
RBC: 2.81 MIL/uL — ABNORMAL LOW (ref 3.87–5.11)
RDW: 19.2 % — ABNORMAL HIGH (ref 11.5–15.5)
WBC: 15.2 10*3/uL — AB (ref 4.0–10.5)

## 2014-09-13 LAB — GLUCOSE, CAPILLARY
GLUCOSE-CAPILLARY: 198 mg/dL — AB (ref 70–99)
GLUCOSE-CAPILLARY: 198 mg/dL — AB (ref 70–99)
GLUCOSE-CAPILLARY: 231 mg/dL — AB (ref 70–99)
GLUCOSE-CAPILLARY: 237 mg/dL — AB (ref 70–99)
Glucose-Capillary: 166 mg/dL — ABNORMAL HIGH (ref 70–99)

## 2014-09-13 LAB — BASIC METABOLIC PANEL
ANION GAP: 11 (ref 5–15)
BUN: 82 mg/dL — ABNORMAL HIGH (ref 6–23)
CALCIUM: 8.4 mg/dL (ref 8.4–10.5)
CO2: 27 mEq/L (ref 19–32)
Chloride: 95 mEq/L — ABNORMAL LOW (ref 96–112)
Creatinine, Ser: 3.18 mg/dL — ABNORMAL HIGH (ref 0.50–1.10)
GFR calc Af Amer: 16 mL/min — ABNORMAL LOW (ref >90)
GFR, EST NON AFRICAN AMERICAN: 14 mL/min — AB (ref >90)
Glucose, Bld: 168 mg/dL — ABNORMAL HIGH (ref 70–99)
Potassium: 4 mEq/L (ref 3.7–5.3)
SODIUM: 133 meq/L — AB (ref 137–147)

## 2014-09-13 LAB — PROTIME-INR
INR: 2.51 — AB (ref 0.00–1.49)
Prothrombin Time: 27.1 seconds — ABNORMAL HIGH (ref 11.6–15.2)

## 2014-09-13 LAB — APTT: aPTT: 46 seconds — ABNORMAL HIGH (ref 24–37)

## 2014-09-13 MED ORDER — WARFARIN SODIUM 2.5 MG PO TABS
2.5000 mg | ORAL_TABLET | Freq: Once | ORAL | Status: AC
  Administered 2014-09-13: 2.5 mg via ORAL
  Filled 2014-09-13: qty 1

## 2014-09-13 MED ORDER — GLYBURIDE 5 MG PO TABS
5.0000 mg | ORAL_TABLET | Freq: Two times a day (BID) | ORAL | Status: DC
  Administered 2014-09-13 – 2014-09-20 (×15): 5 mg via ORAL
  Filled 2014-09-13 (×16): qty 1

## 2014-09-13 MED ORDER — SODIUM CHLORIDE 0.9 % IJ SOLN
10.0000 mL | INTRAMUSCULAR | Status: DC | PRN
  Administered 2014-09-13: 30 mL
  Administered 2014-09-14 – 2014-09-15 (×3): 20 mL
  Administered 2014-09-16 (×2): 10 mL
  Administered 2014-09-17: 20 mL
  Administered 2014-09-19 (×2): 10 mL
  Administered 2014-09-20: 30 mL
  Administered 2014-09-20: 10 mL
  Administered 2014-09-20 – 2014-09-27 (×7): 20 mL
  Administered 2014-09-28 (×2): 10 mL
  Administered 2014-09-29: 20 mL
  Administered 2014-09-30: 30 mL
  Administered 2014-10-01 – 2014-10-05 (×6): 10 mL
  Administered 2014-10-06: 20 mL
  Administered 2014-10-06: 10 mL
  Administered 2014-10-07: 20 mL
  Administered 2014-10-25: 10 mL

## 2014-09-13 MED ORDER — HYDROCORTISONE NA SUCCINATE PF 100 MG IJ SOLR
50.0000 mg | Freq: Two times a day (BID) | INTRAMUSCULAR | Status: DC
  Administered 2014-09-13 – 2014-09-16 (×6): 50 mg via INTRAVENOUS
  Filled 2014-09-13 (×8): qty 1

## 2014-09-13 MED ORDER — FUROSEMIDE 10 MG/ML IJ SOLN
80.0000 mg | Freq: Once | INTRAMUSCULAR | Status: AC
  Administered 2014-09-13: 80 mg via INTRAVENOUS
  Filled 2014-09-13: qty 8

## 2014-09-13 MED ORDER — LINAGLIPTIN 5 MG PO TABS
5.0000 mg | ORAL_TABLET | Freq: Every day | ORAL | Status: DC
  Administered 2014-09-13 – 2014-10-26 (×42): 5 mg via ORAL
  Filled 2014-09-13 (×47): qty 1

## 2014-09-13 MED ORDER — CEFAZOLIN SODIUM-DEXTROSE 2-3 GM-% IV SOLR
2.0000 g | Freq: Two times a day (BID) | INTRAVENOUS | Status: DC
  Administered 2014-09-13 – 2014-09-19 (×13): 2 g via INTRAVENOUS
  Filled 2014-09-13 (×15): qty 50

## 2014-09-13 NOTE — Progress Notes (Signed)
Patient refused CPAP at this time.  Patient aware to have RN call RT if she changes her mind.

## 2014-09-13 NOTE — Progress Notes (Signed)
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   Obesity hypoventilation syndrome-    Cardiomyopathy, nonischemic- EF 30-35% June 2014   Type 2 diabetes mellitus   Chronic combined systolic and diastolic CHF   Long term (current) use of anticoagulants   CKD (chronic kidney disease) stage 4, GFR 15-29 ml/min   PAF- recurrent despite Amiodarone and multiple cardioversions   Mitral insufficiency, moderate to severe   Chronic massive bilat LE lymphadema   Fall at home- 3 falls this summer, sound orthostatic   Hypotension   Acute on chronic renal failure    Patient Profile: 64 yo female w/ hx NICM, EF 30-35% severe MR/TR, mod-sev pulm HTN, PAF (s/p DCCV x 2), PPM, DM, LE edema, morbid obesity, CKD stage III, who was admitted 09/10 with recurrent cellulitis. Cards following for INR, monitoring for afib.  SUBJECTIVE: Seems a little depressed, feels generally bad, says hot and cold, no new specific complaints.   OBJECTIVE Filed Vitals:   09/13/14 0400 09/13/14 0413 09/13/14 0500 09/13/14 0600  BP: 107/73  92/45 107/52  Pulse: 71  70 78  Temp:  97.4 F (36.3 C)    TempSrc:  Oral    Resp: 14  16 16   Height:      Weight:      SpO2: 99%  100% 99%    Intake/Output Summary (Last 24 hours) at 09/13/14 0817 Last data filed at 09/13/14 0600  Gross per 24 hour  Intake    435 ml  Output   1600 ml  Net  -1165 ml   Filed Weights   09/10/14 0639 09/11/14 0500 09/13/14 0348  Weight: 335 lb 14.4 oz (152.363 kg) 379 lb 3.1 oz (172 kg) 379 lb 10.1 oz (172.2 kg)    PHYSICAL EXAM General: Well developed, morbidly obese, female  Head: Normocephalic, atraumatic.  Neck: Supple without bruits, JVD 8-9 cm. Lungs:  Resp regular and unlabored, rales bases. Heart: Irregular, S1, S2, no S3, S4, + murmur; no rub. Abdomen: Soft,  non-tender, non-distended, BS + x 4.  Extremities: No clubbing, cyanosis, 2-3+ edema.  Neuro: Alert and oriented X 3. Moves all extremities spontaneously. Psych: Anxious  LABS: CBC: Recent Labs  09/12/14 0530 09/13/14 0342  WBC 24.9* 15.2*  NEUTROABS 23.8* 14.6*  HGB 9.1* 8.7*  HCT 28.0* 26.3*  MCV 96.9 93.6  PLT 281 226   INR: Recent Labs  09/13/14 0342  INR 2.97*   Basic Metabolic Panel: Recent Labs  09/12/14 0530 09/13/14 0342  NA 133* 133*  K 4.3 4.0  CL 95* 95*  CO2 26 27  GLUCOSE 140* 168*  BUN 82* 82*  CREATININE 3.53* 3.18*  CALCIUM 8.4 8.4   Liver Function Tests: Recent Labs  09/10/14 1715 09/12/14 0530  AST 57* 55*  ALT 49* 43*  ALKPHOS 164* 192*  BILITOT 1.6* 1.8*  PROT 6.2 5.9*  ALBUMIN 1.6* 1.4*   TELE:  Atrial fib, +/- pacing. Has fusion beats, rare AV pacing.   Radiology/Studies: Dg Chest Port 1 View 09/13/2014   CLINICAL DATA:  Assess edema  EXAM: PORTABLE CHEST - 1 VIEW  COMPARISON:  September 12, 2014  FINDINGS: The heart size and mediastinal contours are stable. The heart size is enlarged. Left central venous line is identified with distal tip in the  superior vena cava. Cardiac pacemaker is unchanged. There is enlargement of central pulmonary vessels worse compared to prior exam. There is no focal pneumonia or pleural effusion. The visualized skeletal structures are stable.  IMPRESSION: Slight interval worsened central pulmonary vascular congestion. There is no focal pneumonia or pleural effusion. Cardiomegaly.   Electronically Signed   By: Abelardo Diesel M.D.   On: 09/13/2014 07:31   Dg Chest Port 1 View 09/12/2014   CLINICAL DATA:  Evaluate pulmonary edema.  EXAM: PORTABLE CHEST - 1 VIEW  COMPARISON:  Chest x-ray 09/10/2014.  FINDINGS: There is a left-sided internal jugular central venous catheter with tip terminating in the mid superior vena cava. Right-sided pacemaker device in position with lead tips projecting over the expected location  of the right atrium and right ventricular apex. Lung volumes are normal. No consolidative airspace disease. No pleural effusions. Cephalization of the pulmonary vasculature, without frank pulmonary edema. Moderate cardiomegaly. The patient is rotated to the right on today's exam, resulting in distortion of the mediastinal contours and reduced diagnostic sensitivity and specificity for mediastinal pathology. Atherosclerosis in the thoracic aorta.  IMPRESSION: 1. Support apparatus, as above. 2. Cardiomegaly with pulmonary venous congestion, but no frank pulmonary edema. 3. Atherosclerosis.   Electronically Signed   By: Vinnie Langton M.D.   On: 09/12/2014 08:25     Current Medications:  . amiodarone  400 mg Oral BID  . calcitRIOL  0.25 mcg Oral BID  . calcium-vitamin D  1 tablet Oral BID  . collagenase   Topical Daily  . febuxostat  80 mg Oral Daily  . hydrocortisone sodium succinate  50 mg Intravenous Q6H  . insulin aspart  0-15 Units Subcutaneous TID WC  . insulin glargine  10 Units Subcutaneous QHS  . iron polysaccharides  150 mg Oral BID  . levothyroxine  125 mcg Oral QAC breakfast  . multivitamin with minerals  1 tablet Oral Daily  . mupirocin cream   Topical Daily  . mupirocin ointment   Topical BID  . saccharomyces boulardii  250 mg Oral BID  . sodium chloride  3 mL Intravenous Q12H  . vancomycin  1,750 mg Intravenous Q48H  . Warfarin - Pharmacist Dosing Inpatient   Does not apply q1800   . phenylephrine (NEO-SYNEPHRINE) Adult infusion Stopped (09/12/14 1200)  . vasopressin (PITRESSIN) infusion - *FOR SHOCK* Stopped (09/12/14 0830)    ASSESSMENT AND PLAN: Principal Problem:   Cellulitis of Lt lower leg - Group B strep, per CCM, has been off pressors almost 24 hours, steroids since yesterday  Cardiomyopathy, nonischemic- EF 30-35% June 2014 - CVP last pm was 14, recheck pending, CCM wants it to be higher than usual due to sepsis. By documented weights, she is up 75 lbs since  admission. Diuresis per MD.   Hypoalbuminemia - Albumin 2.8 in May had been 3.3 or greater prior to that, 1.6 on admit, now 1.4. Management per MD, will make volume management difficult.    PAF- recurrent despite Amiodarone and multiple cardioversions - rate is OK considering acute illness, continue amio 400 mg BID. BP still not stable enough to add BB. MD advise if PPM needs interrogation as settings may not be ideal for afib, think it is in DDDR mode.    Long term (current) use of anticoagulants - INR down to therapeutic, rec'd 1 mg Vit K IV 09/13. Follow, restart coumadin per MD.  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  I have reviewed the chart, notes and new data.  I agree with PA's note.  Key new complaints: slow improvement, no dyspnea at rest Key examination changes: rate control is good Key new findings / data: hypoalbuminemia reflects acute illness, no proteinuria, liver abnormalities are probably mostly due to shock liver;  PLAN: She weighed 329 lb in May - 50 lb more today Will need a lot of diuresis, proceed cautiously as she has acute on chronic renal dysfunction (acute dysfunction rapidly improving) Resume warfarin Pacemaker programming is appropriate - we are still hoping that we may be able to return her to NSR/atrial paced rhythm in the future, but no cardioversion planned until fully diuresed.  Sanda Klein, MD,  [place] [phone] 09/13/2014, 10:15 AM

## 2014-09-13 NOTE — Consult Note (Addendum)
WOC follow-up: Previous consult was for bilat leg wounds; refer to progress notes on 9/11. Requested to assess left buttock wound at this time. Stage 2 wound 3.5X2X.1cm; pink granulation buds visible interspersed with yellow.  Yellow drainage from all open areas R/T large amt fluid loss. Lower back with .5X.5cm dark purple deep tissue injury.  Pt states she is allergic to foam silicone dressings and adhesive tape.  Removed foam dressings in 3 areas which had been applied on Sat according to staff and no erythremia or skin reaction noted.  Pt is on a Sport low airloss bed and uses barrier cream at home.  Will respect wish to avoid foam dressing; barrier cream and non-adherent dressing applied to area with out tape to protect and repel moisture..  Please re-consult if further assistance is needed.  Thank-you,  Julien Girt MSN, [place], [place], [place], [place]

## 2014-09-13 NOTE — Progress Notes (Signed)
ANTIBIOTIC + [place] for Ancef, warfarin Indication: Cellulitis, AFib  Allergies  Allergen Reactions  . Ivp Dye [Iodinated Diagnostic Agents] Shortness Of Breath    Turn red, can't breathe  . Betadine [Povidone Iodine]   . Diphenhydramine Hcl Swelling    In hands and eyes  . Fish Allergy Nausea And Vomiting  . Iohexol      Desc: PT TURNS RED AND WHEEZING   . Penicillins Other (See Comments)    Resp arrest as child. Tolerates cephalosporins.  . Povidone-Iodine Other (See Comments)    Wheezing, turn red, can't breath, and blisters  . Promethazine Hcl Other (See Comments)    Low Blood Pressure  . Tape     Blisters, can use paper tape for short periods  . Clindamycin Hives and Rash    wheezing  . Iodine Rash  . Morphine Sulfate Nausea And Vomiting and Rash  . Primaxin [Imipenem] Rash    Patient Measurements: Height: 5' 6.93" (170 cm) Weight: 379 lb 10.1 oz (172.2 kg) IBW/kg (Calculated) : 61.44  Vital Signs: Temp: 98.2 F (36.8 C) (09/14 1319) Temp src: Oral (09/14 1319) BP: 105/80 mmHg (09/14 1330) Pulse Rate: 91 (09/14 1330) Intake/Output from previous day: 09/13 0701 - 09/14 0700 In: 945 [P.O.:480; I.V.:415; IV Piggyback:50] Out: 1800 [Urine:1800] Intake/Output from this shift: Total I/O In: 190 [I.V.:140; IV Piggyback:50] Out: 500 [Urine:500]  Labs:  Recent Labs  09/11/14 0355 09/12/14 0530 09/13/14 0342  WBC 34.1* 24.9* 15.2*  HGB 9.7* 9.1* 8.7*  PLT 371 281 226  CREATININE 3.99* 3.53* 3.18*    Assessment: 57 YOF with septic shock and lower left and right extremity cellulitis.  WBC= 15.2, afeb, SCr= 3.18 , eCrCl 25-30 ml/min (Patient has a solitary kidney). Blood cultures growing strep agalactiae resistant, Urine culture growing GNR.  Vanc 9/10 >> 9/14 levaquin 9/12>> 9/13 Flagyl 9/10 >> 9/12 Cipro 9/11 >> 9/12 Ancef 9/14 >>  9/10 bcx x2>> GBS In 1/2  9/11 urine- GNR  Pt is on warfarin PTA for AFib.  PTA dose: 5 mg TuThSS, 2.5 mg all other days.  Warfarin has been held due to prolonged supratherapeutic INR.  Today INR is down to 2.5.  Goal of Therapy:  Resolution of infection INR 2-3   Plan:  Warfarin 2.5 mg po x1 Daily INR Ancef 2 g IV q12h Plan for prolonged abx course    Hughes Better, PharmD, BCPS Clinical Pharmacist Pager: [phone] 09/13/2014 2:16 PM

## 2014-09-13 NOTE — Care Management (Signed)
This patient is an active patient with [facility]. I will be following for discharge needs. Thank you. Christa See RN BSN [phone]

## 2014-09-13 NOTE — Progress Notes (Signed)
OT Cancellation Note  Patient Details Name: MAYCI HANING MRN: [id number] DOB: 12-Mar-1950   Cancelled Treatment:    Reason Eval/Treat Not Completed: Other (comment) Pt being transferred to 2w. Receiving IV lasix.Will see in am. [place], OTR/L  [phone] 09/13/2014 09/13/2014, 2:54 PM

## 2014-09-13 NOTE — Progress Notes (Signed)
PT Cancellation Note  Patient Details Name: [name] MRN: [id number] DOB: [birth year]   Cancelled Treatment:    Reason Eval/Treat Not Completed: Other (comment) (Pt has gained significant fluid over the past several days and feels mobility will be very limited today.) Will see tomorrow AM and hopefully will have lost some fluid.   [name] 09/13/2014, 2:13 PM

## 2014-09-13 NOTE — Progress Notes (Signed)
PULMONARY / CRITICAL CARE MEDICINE   Name: [name] MRN: [id number] DOB: 1950-04-17    ADMISSION DATE:  09/09/2014 CONSULTATION DATE:  9/11  REFERRING MD :  Eliseo Squires  CHIEF COMPLAINT:  Sepsis   INITIAL PRESENTATION:  64 y.o. female with history of fibromyalgia, hypothyroidism, solitary kidney, hypothyroidism, diabetes, nonischemic cardiomyopathy with ejection fraction of 35%, atrial fibrillation who was admitted on 9/10 w/ recurrent Lower extremity cellulitis. PCCM asked to see in consult on 9/11 for persistent hypotension and worsening renal failure.   STUDIES:  9/11 2 view left lower extremity tib/fib >> no gas, edema  SIGNIFICANT EVENTS: 9/10  admitted to tele. Cultures obtained, ABX started.  9/11  PCCM asked to see for persist hypotension  9/12  remain in shock 9/14  transfer to tele  SUBJECTIVE:  Pt reports feeling better but continues to have leg pain  VITAL SIGNS: Temp:  [97.4 F (36.3 C)-98.2 F (36.8 C)] 98.2 F (36.8 C) (09/14 1319) Pulse Rate:  [31-102] 91 (09/14 1330) Resp:  [14-29] 14 (09/14 1330) BP: (84-122)/(38-92) 105/80 mmHg (09/14 1330) SpO2:  [89 %-100 %] 98 % (09/14 1330) Weight:  [379 lb 10.1 oz (172.2 kg)] 379 lb 10.1 oz (172.2 kg) (09/14 0348)  HEMODYNAMICS: CVP:  [11 mmHg-14 mmHg] 11 mmHg  VENTILATOR SETTINGS:    INTAKE / OUTPUT:  Intake/Output Summary (Last 24 hours) at 09/13/14 1434 Last data filed at 09/13/14 1400  Gross per 24 hour  Intake    490 ml  Output   1650 ml  Net  -1160 ml    PHYSICAL EXAMINATION: General:  No distress Neuro:  Awake, oriented, no focal def  HEENT:  Line wnl  Cardiovascular:  s1 s2 IRR paced Lungs:  Resp's even/non-labored, lungs bilaterally distant but clear Abdomen:  Obese, + bowel sounds  Musculoskeletal:  Generalized weakness  Skin:  Left lower extremity: swollen, erythremic w/ weeping, w/ several blistered areas. Blistering on L thigh, covered.   LABS:  CBC  Recent Labs Lab  09/11/14 0355 09/12/14 0530 09/13/14 0342  WBC 34.1* 24.9* 15.2*  HGB 9.7* 9.1* 8.7*  HCT 30.4* 28.0* 26.3*  PLT 371 281 226   Coag's  Recent Labs Lab 09/11/14 0355 09/12/14 0530 09/13/14 0342  APTT  --  68* 46*  INR 4.19* 5.00* 2.51*   BMET  Recent Labs Lab 09/11/14 0355 09/12/14 0530 09/13/14 0342  NA 132* 133* 133*  K 5.1 4.3 4.0  CL 94* 95* 95*  CO2 24 26 27   BUN 80* 82* 82*  CREATININE 3.99* 3.53* 3.18*  GLUCOSE 79 140* 168*   Electrolytes  Recent Labs Lab 09/11/14 0355 09/12/14 0530 09/13/14 0342  CALCIUM 8.9 8.4 8.4   Sepsis Markers  Recent Labs Lab 09/09/14 1835 09/10/14 0410 09/10/14 1715  LATICACIDVEN 3.16* 3.0* 2.8*   Liver Enzymes  Recent Labs Lab 09/10/14 0410 09/10/14 1715 09/12/14 0530  AST 45* 57* 55*  ALT 45* 49* 43*  ALKPHOS 138* 164* 192*  BILITOT 1.5* 1.6* 1.8*  ALBUMIN 1.6* 1.6* 1.4*   Glucose  Recent Labs Lab 09/12/14 1141 09/12/14 1807 09/12/14 2152 09/13/14 0408 09/13/14 0825 09/13/14 1245  GLUCAP 150* 167* 233* 198* 166* 198*    Imaging Dg Chest Port 1 View  09/12/2014   CLINICAL DATA:  Evaluate pulmonary edema.  EXAM: PORTABLE CHEST - 1 VIEW  COMPARISON:  Chest x-ray 09/10/2014.  FINDINGS: There is a left-sided internal jugular central venous catheter with tip terminating in the mid superior vena cava. Right-sided pacemaker  device in position with lead tips projecting over the expected location of the right atrium and right ventricular apex. Lung volumes are normal. No consolidative airspace disease. No pleural effusions. Cephalization of the pulmonary vasculature, without frank pulmonary edema. Moderate cardiomegaly. The patient is rotated to the right on today's exam, resulting in distortion of the mediastinal contours and reduced diagnostic sensitivity and specificity for mediastinal pathology. Atherosclerosis in the thoracic aorta.  IMPRESSION: 1. Support apparatus, as above. 2. Cardiomegaly with pulmonary  venous congestion, but no frank pulmonary edema. 3. Atherosclerosis.   Electronically Signed   By: Vinnie Langton M.D.   On: 09/12/2014 08:25     ASSESSMENT / PLAN:  PULMONARY OETT n/a A: OSA/OHS No evidence pulmonary edema Moderate Pulmonary HTN P:   Continue auto-set CPAP at HS  Trend PCXR Pulmonary hygiene  CARDIOVASCULAR CVL A: Septic shock - resolved.  Atrial fib w/ CVR NICM EF 30-35% Moderate known pulm htn P:  Maintain amio Tx to Tele Monitor rate / rhythm   RENAL A:   Acute on chronic renal failure - improving Hyponatremia CKD stag IV (has solitary kidney) P:   Renal dose meds Monitor I&O Lasix 80 mg x1 dose  GASTROINTESTINAL A:   Obesity  Elevated LFTs-->prob hypoperfusion  P:   Advance diet PPI  HEMATOLOGIC A:   Chronic anemia  Coumadin induced coagulopathy in setting septic shock - improved.  P:  Trend CBC Coumadin per pharmacy.  F/u INR in am  Transfuse for hgb < 7   INFECTIOUS A:   Left Lower extremity +/- right Lower extremity cellulitis Bilateral thigh wounds Septic shock P:   BCx2 9/10>>>GPC pairs>>>group B strep agalactai WUG:QBVQ 9/10>>>9/14 ABX flagyl 9/10>>>9/12 ABX cipro 9/11>>>9/12 Levofloxacin 9/12>>>9/13 Cefazolin 9/14>>> Continue Santyl to 3 areas of thigh wounds as previously ordered by CCS team, and Bactroban to left calf wounds as previously ordered, according to patient  ENDOCRINE A:   DM - controlled Hypothyroidism  P:   SSI Cont synthroid supplementation  Wean steroids to off, stress steroids while in shock.  ? If cortisol was sent after dosing.    NEUROLOGIC A:   Pain  P:   RASS goal: 0 Supportive care  Norco PRN  TODAY'S SUMMARY: Septic shock resolved.  Discontinue vanc, add cefazolin, transfer to tele. TRH to assume AM 9/15 and PCCM to sign off  I have personally obtained a history, examined the patient, evaluated laboratory and imaging results, formulated the assessment and plan and placed  orders.  CRITICAL CARE: The patient is critically ill with multiple organ systems failure and requires high complexity decision making for assessment and support, frequent evaluation and titration of therapies, application of advanced monitoring technologies and extensive interpretation of multiple databases. Critical Care Time devoted to patient care services described in this note is __minutes.   Magdalene River, PA-S  [place], NP-C [facility] Pgr: [phone] or [phone]  .noes  09/13/2014, 2:34 PM

## 2014-09-14 ENCOUNTER — Inpatient Hospital Stay (HOSPITAL_COMMUNITY): Payer: Medicare Other

## 2014-09-14 DIAGNOSIS — N184 Chronic kidney disease, stage 4 (severe): Secondary | ICD-10-CM

## 2014-09-14 DIAGNOSIS — I059 Rheumatic mitral valve disease, unspecified: Secondary | ICD-10-CM

## 2014-09-14 LAB — CBC
HCT: 25.5 % — ABNORMAL LOW (ref 36.0–46.0)
Hemoglobin: 8.5 g/dL — ABNORMAL LOW (ref 12.0–15.0)
MCH: 30.9 pg (ref 26.0–34.0)
MCHC: 33.3 g/dL (ref 30.0–36.0)
MCV: 92.7 fL (ref 78.0–100.0)
PLATELETS: 220 10*3/uL (ref 150–400)
RBC: 2.75 MIL/uL — ABNORMAL LOW (ref 3.87–5.11)
RDW: 18.6 % — ABNORMAL HIGH (ref 11.5–15.5)
WBC: 10.8 10*3/uL — ABNORMAL HIGH (ref 4.0–10.5)

## 2014-09-14 LAB — BASIC METABOLIC PANEL
Anion gap: 12 (ref 5–15)
BUN: 85 mg/dL — AB (ref 6–23)
CO2: 28 mEq/L (ref 19–32)
CREATININE: 2.83 mg/dL — AB (ref 0.50–1.10)
Calcium: 8.4 mg/dL (ref 8.4–10.5)
Chloride: 97 mEq/L (ref 96–112)
GFR, EST AFRICAN AMERICAN: 19 mL/min — AB (ref >90)
GFR, EST NON AFRICAN AMERICAN: 16 mL/min — AB (ref >90)
Glucose, Bld: 225 mg/dL — ABNORMAL HIGH (ref 70–99)
Potassium: 3.7 mEq/L (ref 3.7–5.3)
Sodium: 137 mEq/L (ref 137–147)

## 2014-09-14 LAB — PROTIME-INR
INR: 2.07 — ABNORMAL HIGH (ref 0.00–1.49)
PROTHROMBIN TIME: 23.3 s — AB (ref 11.6–15.2)

## 2014-09-14 LAB — GLUCOSE, CAPILLARY
GLUCOSE-CAPILLARY: 248 mg/dL — AB (ref 70–99)
GLUCOSE-CAPILLARY: 231 mg/dL — AB (ref 70–99)
GLUCOSE-CAPILLARY: 233 mg/dL — AB (ref 70–99)
Glucose-Capillary: 247 mg/dL — ABNORMAL HIGH (ref 70–99)
Glucose-Capillary: 240 mg/dL — ABNORMAL HIGH (ref 70–99)

## 2014-09-14 MED ORDER — PRO-STAT SUGAR FREE PO LIQD
30.0000 mL | Freq: Two times a day (BID) | ORAL | Status: DC
  Administered 2014-09-14 – 2014-09-15 (×2): 30 mL via ORAL
  Filled 2014-09-14 (×15): qty 30

## 2014-09-14 MED ORDER — INSULIN ASPART 100 UNIT/ML ~~LOC~~ SOLN
0.0000 [IU] | Freq: Three times a day (TID) | SUBCUTANEOUS | Status: DC
  Administered 2014-09-14 – 2014-09-16 (×8): 5 [IU] via SUBCUTANEOUS
  Administered 2014-09-17: 3 [IU] via SUBCUTANEOUS
  Administered 2014-09-18: 5 [IU] via SUBCUTANEOUS
  Administered 2014-09-19 (×2): 3 [IU] via SUBCUTANEOUS
  Administered 2014-09-19: 5 [IU] via SUBCUTANEOUS
  Administered 2014-09-20 (×2): 3 [IU] via SUBCUTANEOUS
  Administered 2014-09-20: 15 [IU] via SUBCUTANEOUS
  Administered 2014-09-21 – 2014-09-22 (×6): 3 [IU] via SUBCUTANEOUS
  Administered 2014-09-23 (×3): 2 [IU] via SUBCUTANEOUS
  Administered 2014-09-24: 3 [IU] via SUBCUTANEOUS
  Administered 2014-09-24: 2 [IU] via SUBCUTANEOUS
  Administered 2014-09-25 – 2014-09-26 (×5): 3 [IU] via SUBCUTANEOUS
  Administered 2014-09-26: 5 [IU] via SUBCUTANEOUS
  Administered 2014-09-27: 3 [IU] via SUBCUTANEOUS
  Administered 2014-09-27 (×2): 5 [IU] via SUBCUTANEOUS
  Administered 2014-09-28 (×2): 3 [IU] via SUBCUTANEOUS
  Administered 2014-09-28: 5 [IU] via SUBCUTANEOUS
  Administered 2014-09-29 – 2014-10-01 (×7): 3 [IU] via SUBCUTANEOUS
  Administered 2014-10-01: 5 [IU] via SUBCUTANEOUS
  Administered 2014-10-01: 2 [IU] via SUBCUTANEOUS
  Administered 2014-10-02 (×2): 5 [IU] via SUBCUTANEOUS
  Administered 2014-10-02: 3 [IU] via SUBCUTANEOUS
  Administered 2014-10-03: 2 [IU] via SUBCUTANEOUS
  Administered 2014-10-03 – 2014-10-04 (×2): 5 [IU] via SUBCUTANEOUS
  Administered 2014-10-04 (×2): 3 [IU] via SUBCUTANEOUS
  Administered 2014-10-05: 2 [IU] via SUBCUTANEOUS
  Administered 2014-10-05 – 2014-10-06 (×4): 3 [IU] via SUBCUTANEOUS
  Administered 2014-10-07: 5 [IU] via SUBCUTANEOUS
  Administered 2014-10-07 – 2014-10-09 (×5): 3 [IU] via SUBCUTANEOUS
  Administered 2014-10-09 (×2): 2 [IU] via SUBCUTANEOUS
  Administered 2014-10-10 (×3): 3 [IU] via SUBCUTANEOUS
  Administered 2014-10-11 – 2014-10-12 (×3): 2 [IU] via SUBCUTANEOUS
  Administered 2014-10-13: 3 [IU] via SUBCUTANEOUS
  Administered 2014-10-13 (×2): 2 [IU] via SUBCUTANEOUS
  Administered 2014-10-14: 3 [IU] via SUBCUTANEOUS
  Administered 2014-10-14 – 2014-10-18 (×9): 2 [IU] via SUBCUTANEOUS
  Administered 2014-10-19: 3 [IU] via SUBCUTANEOUS
  Administered 2014-10-19 (×2): 2 [IU] via SUBCUTANEOUS
  Administered 2014-10-20: 3 [IU] via SUBCUTANEOUS
  Administered 2014-10-20: 2 [IU] via SUBCUTANEOUS
  Administered 2014-10-21: 5 [IU] via SUBCUTANEOUS
  Administered 2014-10-21 – 2014-10-22 (×3): 3 [IU] via SUBCUTANEOUS
  Administered 2014-10-22: 4 [IU] via SUBCUTANEOUS
  Administered 2014-10-23: 2 [IU] via SUBCUTANEOUS
  Administered 2014-10-23 – 2014-10-24 (×3): 3 [IU] via SUBCUTANEOUS
  Administered 2014-10-24: 5 [IU] via SUBCUTANEOUS
  Administered 2014-10-25 (×2): 2 [IU] via SUBCUTANEOUS
  Administered 2014-10-25: 3 [IU] via SUBCUTANEOUS
  Administered 2014-10-26: 2 [IU] via SUBCUTANEOUS

## 2014-09-14 MED ORDER — FUROSEMIDE 10 MG/ML IJ SOLN
80.0000 mg | Freq: Two times a day (BID) | INTRAMUSCULAR | Status: DC
  Administered 2014-09-14 – 2014-09-20 (×14): 80 mg via INTRAVENOUS
  Administered 2014-09-21: 40 mg via INTRAVENOUS
  Administered 2014-09-21: 80 mg via INTRAVENOUS
  Administered 2014-09-22: 40 mg via INTRAVENOUS
  Filled 2014-09-14 (×19): qty 8

## 2014-09-14 MED ORDER — INSULIN ASPART 100 UNIT/ML ~~LOC~~ SOLN
0.0000 [IU] | Freq: Every day | SUBCUTANEOUS | Status: DC
  Administered 2014-09-14 – 2014-10-22 (×11): 2 [IU] via SUBCUTANEOUS

## 2014-09-14 MED ORDER — FUROSEMIDE 10 MG/ML IJ SOLN
80.0000 mg | Freq: Two times a day (BID) | INTRAMUSCULAR | Status: DC
  Filled 2014-09-14: qty 8

## 2014-09-14 MED ORDER — WARFARIN SODIUM 5 MG PO TABS
5.0000 mg | ORAL_TABLET | Freq: Once | ORAL | Status: AC
  Administered 2014-09-14: 5 mg via ORAL
  Filled 2014-09-14: qty 1

## 2014-09-14 MED ORDER — CALCIUM CARBONATE ANTACID 500 MG PO CHEW
1.0000 | CHEWABLE_TABLET | Freq: Two times a day (BID) | ORAL | Status: DC | PRN
  Administered 2014-09-14 – 2014-10-07 (×3): 200 mg via ORAL
  Filled 2014-09-14 (×4): qty 1

## 2014-09-14 MED ORDER — INSULIN GLARGINE 100 UNIT/ML ~~LOC~~ SOLN
15.0000 [IU] | Freq: Every day | SUBCUTANEOUS | Status: DC
  Administered 2014-09-14: 15 [IU] via SUBCUTANEOUS
  Filled 2014-09-14 (×2): qty 0.15

## 2014-09-14 NOTE — Progress Notes (Signed)
PT Cancellation Note  Patient Details Name: [name] MRN: [id number] DOB: 08-09-1950   Cancelled Treatment:    Reason Eval/Treat Not Completed: Other (comment) (Pt slid to floor with OT. Assisted pt back to bed using maximove.)   [name] 09/14/2014, 1:56 PM

## 2014-09-14 NOTE — Progress Notes (Addendum)
PROGRESS NOTE  [name] UMP:[id number] DOB: 04-06-1950 DOA: 09/09/2014 PCP: Elby Showers, MD  64 y.o. female with history of fibromyalgia, hypothyroidism, solitary kidney, hypothyroidism, diabetes, nonischemic cardiomyopathy with ejection fraction of 35%, atrial fibrillation who was admitted on 9/10 w/ recurrent Lower extremity cellulitis. PCCM asked to see in consult on 9/11 for persistent hypotension and worsening renal failure. Was in ICU on pressors until 9/14  Assessment/Plan: Septic shock due to Left lower leg cellulitis:  - abx - Keep lower extremity elevated.  - blood culture X 2 - 1/2 group B strep - Wound care consult  - pt/ot   . Type 2 diabetes mellitus:  - A1c 7.0  - lantus - SSI while inpatient.  - Follow CBGs   . PAF (paroxysmal atrial fibrillation)  - Currently with a paced rhythm, monitor in telemetry.  - Coumadin to be dosed by pharmacy.  - Continue home medications  . Combined systolic and diastolic congestive heart failure- EF 30-35% June 2014   . Hypothyroidism, acquired, autoimmune: Recent TSH was 2.12 on 05/28/14.  - Continue levothyroxine   Gout: stable  -Continue home medication   . CKD (chronic kidney disease) stage 4, GFR 15-29 ml/min: Cre is slightly higher from baseline 2.5-3.00 to 3.38 on admission  - Monitor closely  - hold diuretics as above.   Marland Kitchen PACEMAKER, PERMANENT  - Monitor in telemetry.   . Obesity hypoventilation syndrome  - CPAP each bedtime   . OBESITY, MORBID   Code Status: full Family Communication: patient Disposition Plan: SNF   Consultants:  PCCM  cards  Procedures:      HPI/Subjective: Blood sugars are up  Objective: Filed Vitals:   09/14/14 0808  BP: 104/59  Pulse: 65  Temp: 98.5 F (36.9 C)  Resp:     Intake/Output Summary (Last 24 hours) at 09/14/14 0951 Last data filed at 09/14/14 0610  Gross per 24 hour  Intake    420 ml  Output   3300 ml  Net  -2880 ml   Filed Weights   09/11/14 0500 09/13/14 0348 09/14/14 0634  Weight: 172 kg (379 lb 3.1 oz) 172.2 kg (379 lb 10.1 oz) 155.221 kg (342 lb 3.2 oz)    Exam:   General:  Pleasant/A+Ox3  Cardiovascular: irr  Respiratory: clear, no wheezing  Abdomen: obese  Musculoskeletal: +Edema, less redness on left leg   Data Reviewed: Basic Metabolic Panel:  Recent Labs Lab 09/10/14 1715 09/11/14 0355 09/12/14 0530 09/13/14 0342 09/14/14 0615  NA 134* 132* 133* 133* 137  K 5.1 5.1 4.3 4.0 3.7  CL 95* 94* 95* 95* 97  CO2 25 24 26 27 28   GLUCOSE 108* 79 140* 168* 225*  BUN 80* 80* 82* 82* 85*  CREATININE 3.87* 3.99* 3.53* 3.18* 2.83*  CALCIUM 8.5 8.9 8.4 8.4 8.4   Liver Function Tests:  Recent Labs Lab 09/10/14 0410 09/10/14 1715 09/12/14 0530  AST 45* 57* 55*  ALT 45* 49* 43*  ALKPHOS 138* 164* 192*  BILITOT 1.5* 1.6* 1.8*  PROT 6.2 6.2 5.9*  ALBUMIN 1.6* 1.6* 1.4*   No results found for this basename: LIPASE, AMYLASE,  in the last 168 hours No results found for this basename: AMMONIA,  in the last 168 hours CBC:  Recent Labs Lab 09/09/14 1820 09/10/14 0410 09/11/14 0355 09/12/14 0530 09/13/14 0342 09/14/14 0615  WBC 19.0* 18.9* 34.1* 24.9* 15.2* 10.8*  NEUTROABS 18.4* 18.2*  --  23.8* 14.6*  --   HGB 9.6* 8.4* 9.7* 9.1*  8.7* 8.5*  HCT 29.5* 26.1* 30.4* 28.0* 26.3* 25.5*  MCV 98.0 96.0 99.0 96.9 93.6 92.7  PLT 251 214 371 281 226 220   Cardiac Enzymes: No results found for this basename: CKTOTAL, CKMB, CKMBINDEX, TROPONINI,  in the last 168 hours BNP (last 3 results) No results found for this basename: PROBNP,  in the last 8760 hours CBG:  Recent Labs Lab 09/13/14 1245 09/13/14 1639 09/13/14 2103 09/14/14 0247 09/14/14 0606  GLUCAP 198* 231* 237* 247* 248*    Recent Results (from the past 240 hour(s))  CULTURE, BLOOD (ROUTINE X 2)     Status: None   Collection Time    09/09/14  6:20 PM      Result Value Ref Range Status   Specimen Description BLOOD RIGHT ARM    Final   Special Requests BOTTLES DRAWN AEROBIC AND ANAEROBIC 10CC   Final   Culture  Setup Time     Final   Value: 09/09/2014 22:28     Performed at [facility]   Culture     Final   Value: GROUP B STREP(S.AGALACTIAE)ISOLATED     Note: Gram Stain Report Called to,Read Back By and Verified With: ALICIA SPERRY ON 5/40/0867 AT 9:04P BY WILEJ     Performed at [facility]   Report Status 09/12/2014 FINAL   Final   Organism ID, Bacteria GROUP B STREP(S.AGALACTIAE)ISOLATED   Final  CULTURE, BLOOD (ROUTINE X 2)     Status: None   Collection Time    09/09/14  6:35 PM      Result Value Ref Range Status   Specimen Description BLOOD ARM RIGHT   Final   Special Requests BOTTLES DRAWN AEROBIC AND ANAEROBIC 0.2CC   Final   Culture  Setup Time     Final   Value: 09/10/2014 01:00     Performed at [facility]   Culture     Final   Value:        BLOOD CULTURE RECEIVED NO GROWTH TO DATE CULTURE WILL BE HELD FOR 5 DAYS BEFORE ISSUING A FINAL NEGATIVE REPORT     Performed at [facility]   Report Status PENDING   Incomplete  MRSA PCR SCREENING     Status: None   Collection Time    09/10/14  4:02 PM      Result Value Ref Range Status   MRSA by PCR NEGATIVE  NEGATIVE Final   Comment:            The GeneXpert MRSA Assay (FDA     approved for NASAL specimens     only), is one component of a     comprehensive MRSA colonization     surveillance program. It is not     intended to diagnose MRSA     infection nor to guide or     monitor treatment for     MRSA infections.  URINE CULTURE     Status: None   Collection Time    09/10/14  4:02 PM      Result Value Ref Range Status   Specimen Description URINE, CATHETERIZED   Final   Special Requests Normal   Final   Culture  Setup Time     Final   Value: 09/10/2014 17:04     Performed at [place] [facility]     Final   Value: 85,000 COLONIES/ML     Performed at [facility]  Final     Value: GRAM NEGATIVE RODS     Performed at [facility]   Report Status PENDING   Incomplete     Studies: Dg Chest Port 1 View  09/14/2014   CLINICAL DATA:  Airspace disease.  EXAM: PORTABLE CHEST - 1 VIEW  COMPARISON:  09/13/2014.  FINDINGS: Support apparatus: LEFT IJ central line unchanged with the tip in the upper to mid SVC. Pacemaker leads appear unchanged.  Cardiomediastinal Silhouette:  Enlarged, unchanged.  Lungs: Mild improvement in pulmonary edema. Pulmonary vascular congestion persists. Subsegmental atelectasis the LEFT mid lung. No focal consolidation. No pneumothorax.  Effusions:  None.  Other:  None.  IMPRESSION: 1. Stable support apparatus. 2. Unchanged cardiomegaly. 3. Improved central pulmonary edema with persistent pulmonary vascular congestion.   Electronically Signed   By: Dereck Ligas M.D.   On: 09/14/2014 08:10   Dg Chest Port 1 View  09/13/2014   CLINICAL DATA:  Assess edema  EXAM: PORTABLE CHEST - 1 VIEW  COMPARISON:  September 12, 2014  FINDINGS: The heart size and mediastinal contours are stable. The heart size is enlarged. Left central venous line is identified with distal tip in the superior vena cava. Cardiac pacemaker is unchanged. There is enlargement of central pulmonary vessels worse compared to prior exam. There is no focal pneumonia or pleural effusion. The visualized skeletal structures are stable.  IMPRESSION: Slight interval worsened central pulmonary vascular congestion. There is no focal pneumonia or pleural effusion. Cardiomegaly.   Electronically Signed   By: Abelardo Diesel M.D.   On: 09/13/2014 07:31    Scheduled Meds: . amiodarone  400 mg Oral BID  . calcitRIOL  0.25 mcg Oral BID  . calcium-vitamin D  1 tablet Oral BID  .  ceFAZolin (ANCEF) IV  2 g Intravenous Q12H  . collagenase   Topical Daily  . febuxostat  80 mg Oral Daily  . furosemide  80 mg Intravenous BID  . glyBURIDE  5 mg Oral BID WC  . hydrocortisone sodium succinate  50 mg  Intravenous Q12H  . insulin aspart  0-15 Units Subcutaneous TID WC  . insulin aspart  0-5 Units Subcutaneous QHS  . insulin glargine  15 Units Subcutaneous QHS  . iron polysaccharides  150 mg Oral BID  . levothyroxine  125 mcg Oral QAC breakfast  . linagliptin  5 mg Oral Daily  . multivitamin with minerals  1 tablet Oral Daily  . mupirocin cream   Topical Daily  . mupirocin ointment   Topical BID  . saccharomyces boulardii  250 mg Oral BID  . sodium chloride  3 mL Intravenous Q12H  . Warfarin - Pharmacist Dosing Inpatient   Does not apply q1800   Continuous Infusions:  Antibiotics Given (last 72 hours)   Date/Time Action Medication Dose Rate   09/11/14 1041 Given   levofloxacin (LEVAQUIN) IVPB 500 mg 500 mg 100 mL/hr   09/11/14 2245 Given   vancomycin (VANCOCIN) 1,750 mg in sodium chloride 0.9 % 500 mL IVPB 1,750 mg 250 mL/hr   09/13/14 1352 Given   ceFAZolin (ANCEF) IVPB 2 g/50 mL premix 2 g 100 mL/hr   09/14/14 0053 Given   ceFAZolin (ANCEF) IVPB 2 g/50 mL premix 2 g 100 mL/hr      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   Obesity hypoventilation syndrome-  Cardiomyopathy, nonischemic- EF 30-35% June 2014   Type 2 diabetes mellitus   Chronic combined systolic and diastolic CHF   Long term (current) use of anticoagulants   CKD (chronic kidney disease) stage 4, GFR 15-29 ml/min   PAF- recurrent despite Amiodarone and multiple cardioversions   Mitral insufficiency, moderate to severe   Chronic massive bilat LE lymphadema   Fall at home- 3 falls this summer, sound orthostatic   Hypotension   Acute on chronic renal failure    Time spent: 35 min    Braxson Hollingsworth, [facility] Pager [phone]. If 7PM-7AM, please contact night-coverage at [email or web address], password [facility] 09/14/2014, 9:51 AM  LOS: 5 days

## 2014-09-14 NOTE — Progress Notes (Signed)
ANTICOAGULATION CONSULT NOTE - Follow Up Consult  Pharmacy Consult:  Coumadin Indication: atrial fibrillation  Allergies  Allergen Reactions  . Ivp Dye [Iodinated Diagnostic Agents] Shortness Of Breath    Turn red, can't breathe  . Betadine [Povidone Iodine]   . Diphenhydramine Hcl Swelling    In hands and eyes  . Fish Allergy Nausea And Vomiting  . Iohexol      Desc: PT TURNS RED AND WHEEZING   . Penicillins Other (See Comments)    Resp arrest as child. Tolerates cephalosporins.  . Povidone-Iodine Other (See Comments)    Wheezing, turn red, can't breath, and blisters  . Promethazine Hcl Other (See Comments)    Low Blood Pressure  . Tape     Blisters, can use paper tape for short periods  . Clindamycin Hives and Rash    wheezing  . Iodine Rash  . Morphine Sulfate Nausea And Vomiting and Rash  . Primaxin [Imipenem] Rash    Patient Measurements: Height: 5' 6.93" (170 cm) Weight: 342 lb 3.2 oz (155.221 kg) IBW/kg (Calculated) : 61.44  Vital Signs: Temp: 98.5 F (36.9 C) (09/15 0808) Temp src: Oral (09/15 0808) BP: 104/59 mmHg (09/15 0808) Pulse Rate: 65 (09/15 0808)  Labs:  Recent Labs  09/12/14 0530 09/13/14 0342 09/14/14 0615  HGB 9.1* 8.7* 8.5*  HCT 28.0* 26.3* 25.5*  PLT 281 226 220  APTT 68* 46*  --   LABPROT 46.4* 27.1* 23.3*  INR 5.00* 2.51* 2.07*  CREATININE 3.53* 3.18* 2.83*    Estimated Creatinine Clearance: 30.5 ml/min (by C-G formula based on Cr of 2.83).    Assessment: 74 YOF continues on Coumadin from PTA for history of Afib.  INR supra-therapeutic (3.4) on admit, then peaked at 5 likely due to drug-drug interactions with Cipro and Flagyl.  She then received Vitamin K 1mg  IV x 1 on 09/12/14.  Today's INR is decreasing but remaining therapeutic.  No bleeding reported.   Goal of Therapy:  INR 2-3    Plan:  - Coumadin 5mg  PO today - Daily PT / INR - Continue Ancef 2gm IV Q12H - Monitor renal fxn, clinical progress - Watch  CBGs    Aalliyah Kilker D. Mina Marble, PharmD, BCPS Pager:  [phone] 09/14/2014, 10:36 AM

## 2014-09-14 NOTE — Progress Notes (Signed)
Patient continues to refuse CPAP machine. Patient is aware to call if she does change her mind about wearing the machine. RT will continue to assist as needed.

## 2014-09-14 NOTE — Care Management Note (Signed)
Page 1 of 2   10/26/2014     3:53:18 PM CARE MANAGEMENT NOTE 10/26/2014  Patient:  Dickerman,Amry G   Account Number:  [id number]  Date Initiated:  09/14/2014  Documentation initiated by:  Kyara Boxer  Subjective/Objective Assessment:   Pt adm on 09/09/14 with cellulitis, sepsis.  PTA, pt resided at home with spouse.  Recent fall at home.     Action/Plan:   PT/OT recommending SNF at dc.  CSW following to facilitate dc to SNF when medically stable.   Anticipated DC Date:  10/27/2014   Anticipated DC Plan:  LONG TERM ACUTE CARE (LTAC)  In-house referral  Clinical Social Worker      DC Planning Services  CM consult      Choice offered to / List presented to:             Status of service:  Completed, signed off Medicare Important Message given?  YES (If response is "NO", the following Medicare IM given date fields will be blank) Date Medicare IM given:  10/25/2014 Medicare IM given by:  Elissa Hefty Date Additional Medicare IM given:  10/20/2014 Additional Medicare IM given by:  Aubrynn Katona  Discharge Disposition:  LONG TERM ACUTE CARE (LTAC)  Per UR Regulation:  Reviewed for med. necessity/level of care/duration of stay  If discussed at [place] of Stay Meetings, dates discussed:   09/16/2014  09/21/2014  09/28/2014  09/30/2014  10/26/2014    Comments:  10/26/14 Ellan Lambert, RN, BSN [phone] Received call from [facility]:  pt has been approved by her insurance for admission to [facility]--bed available for admission today.  Pt medically ready for dc, per MD.  Pt and sister very happy with this news.  [facility] to call bedside nurse with room # and transfer info when available.  10/20/14 Ellan Lambert, RN, BSN [phone] Sister called [facility], and she is interested in facility. Delrae Rend, Child psychotherapist on floor here to speak with sister, and insurance is not contracted with facility. Sister to check with insurance co to see if they would be  willing to approve out of network benefit, and then pay privately for [facility].  Sister states she will call today. Will follow progress.  10/15/2014 1600 NCM spoke to pt and states her Medicare A&B will not start until Dec 31, 2014. Will have possible revisit SNF placement if Medicare does not begin until Jan 1. NCM spoke to sister, Kieth Brightly # [phone] and states traditional Medicare A/B will not start Dec 31, 2014. Waiting for final recommendation for dc.  Jonnie Finner RN CCM Case Mgmt phone [phone]  10/15/2014 1100 Additional Medicare IM message given. Jonnie Finner RN CCM Case Mgmt phone [phone]   10/01/14 Ellan Lambert, RN, BSN [phone] 1:30pm Met with pt and her sister, Kieth Brightly, to discuss discharge plans.  Sister and pt discussed pt's history and recent events of pt's medical problems.  Pt had a somewhat terrible experience at a local nursing facility, and is understandably hesitant to go to another skilled nursing facility.  Pt/sister desire for her to go to [facility], and sister is aware that if pt had traditional Medicare, LTAC coverage would not be a problem. Sister plans to call insurance company to see if managed care plan can be changed to a supplement. Pt and sister are somewhat upset, as they feel pt is about to be "kicked out" of the hospital.  Assured pt that pt is not medically stable for dc at this point,  and would remain in hospital until that time. Sister plans to notify me if she is able to change managed care to regular medicare + supplement.  They are appreciative of my visit.  09/21/14 Ellan Lambert, RN, BSN [phone] CSW cont to follow for SNF placement, as recommended by PT/OT.  Cont aggressive diuresis with IV lasix.  Will follow progress.

## 2014-09-14 NOTE — Progress Notes (Signed)
INITIAL NUTRITION ASSESSMENT  DOCUMENTATION CODES Per approved criteria  -Morbid Obesity   INTERVENTION: Prostat liquid protein po 30 ml BID with meals, each supplement provides 100 kcal, 15 grams protein RD to follow for nutrition care plan  NUTRITION DIAGNOSIS: Increased nutrient needs related to wound healing as evidenced by estimated nutrition needs  Goal: Pt to meet >/= 90% of their estimated nutrition needs   Monitor:  PO & supplemental intake, weight, labs, I/O's  Reason for Assessment: Low [place]  64 y.o. female  Admitting Dx: Cellulitis of lower leg  ASSESSMENT: 64 y.o. Female with history of fibromyalgia, hypothyroidism, solitary kidney, hypothyroidism, diabetes, nonischemic cardiomyopathy with ejection fraction of 35%, atrial fibrillation who presented to ED with left-sided lower leg pain.  RD unable to obtain nutrition hx at time of visit; pt s/p fall to floor; CWOCN note reviewed -- pt with bilateral thigh wounds; PO intake good at 75-100% per flowsheet records; nutrient needs (protein) increased given wound presence; would benefit from additional protein; RD to order.  Height: Ht Readings from Last 1 Encounters:  09/09/14 5' 6.93" (1.7 m)    Weight: Wt Readings from Last 1 Encounters:  09/14/14 342 lb 3.2 oz (155.221 kg)    Ideal Body Weight: 135 lb  % Ideal Body Weight: 253%  Wt Readings from Last 10 Encounters:  09/14/14 342 lb 3.2 oz (155.221 kg)  09/02/14 304 lb (137.893 kg)  08/03/14 340 lb 9.8 oz (154.5 kg)  06/02/14 329 lb (149.233 kg)  06/02/14 329 lb (149.233 kg)  05/25/14 329 lb (149.233 kg)  04/09/14 318 lb (144.244 kg)  03/31/14 329 lb (149.233 kg)  03/18/14 323 lb 4.8 oz (146.648 kg)  03/12/14 332 lb 6.4 oz (150.776 kg)    Usual Body Weight: 304 lb  % Usual Body Weight: 112%  BMI:  Body mass index is 53.71 kg/(m^2).  Estimated Nutritional Needs: Kcal: 1700-1900 Protein: 100-110 gm Fluid: 1.7-1.9 L  Skin:  Left thigh full  thickness wound Right thigh full thickness wound Left outer calf with partial thickness stasis ulcers  Diet Order: Carb Control  EDUCATION NEEDS: -No education needs identified at this time   Intake/Output Summary (Last 24 hours) at 09/14/14 1307 Last data filed at 09/14/14 0904  Gross per 24 hour  Intake    580 ml  Output   3100 ml  Net  -2520 ml   Labs:   Recent Labs Lab 09/12/14 0530 09/13/14 0342 09/14/14 0615  NA 133* 133* 137  K 4.3 4.0 3.7  CL 95* 95* 97  CO2 26 27 28   BUN 82* 82* 85*  CREATININE 3.53* 3.18* 2.83*  CALCIUM 8.4 8.4 8.4  GLUCOSE 140* 168* 225*    CBG (last 3)   Recent Labs  09/14/14 0247 09/14/14 0606 09/14/14 1141  GLUCAP 247* 248* 231*    Scheduled Meds: . amiodarone  400 mg Oral BID  . calcitRIOL  0.25 mcg Oral BID  . calcium-vitamin D  1 tablet Oral BID  .  ceFAZolin (ANCEF) IV  2 g Intravenous Q12H  . collagenase   Topical Daily  . febuxostat  80 mg Oral Daily  . furosemide  80 mg Intravenous BID  . glyBURIDE  5 mg Oral BID WC  . hydrocortisone sodium succinate  50 mg Intravenous Q12H  . insulin aspart  0-15 Units Subcutaneous TID WC  . insulin aspart  0-5 Units Subcutaneous QHS  . insulin glargine  15 Units Subcutaneous QHS  . iron polysaccharides  150 mg  Oral BID  . levothyroxine  125 mcg Oral QAC breakfast  . linagliptin  5 mg Oral Daily  . multivitamin with minerals  1 tablet Oral Daily  . mupirocin cream   Topical Daily  . mupirocin ointment   Topical BID  . saccharomyces boulardii  250 mg Oral BID  . sodium chloride  3 mL Intravenous Q12H  . warfarin  5 mg Oral ONCE-1800  . Warfarin - Pharmacist Dosing Inpatient   Does not apply q1800    Continuous Infusions:   Past Medical History  Diagnosis Date  . Personal history of other diseases of circulatory system   . 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 Chronic lymphocytic thyroiditis   . Morbid obesity   . Other chronic pulmonary heart  diseases   . Obstructive sleep apnea (adult) (pediatric)   . Other and unspecified hyperlipidemia   . Fibromyalgia   . Goiter   . Thyroiditis, autoimmune   . Hypothyroidism, acquired, autoimmune   . Solitary kidney, acquired   . Fatigue   . Hyperparathyroidism, secondary   . Vitamin D deficiency disease   . Hyperparathyroidism   . H/O varicose veins 03/27/12  . Type II or unspecified type diabetes mellitus without mention of complication, not stated as uncontrolled   . Diabetes mellitus type II   . Infections of kidney 03/27/12  . History of measles, mumps, or rubella 03/27/12  . Tubal pregnancy 12/23/76  . Unspecified essential hypertension     20 yrs ago  . Blood transfusion 1977    [facility]  . Complication of anesthesia 2010    hard to wake up after knee surgery  . Anemia   . Cardiomyopathy, nonischemic     EF 45 %  . Endometrial hyperplasia with atypia 05/05/12  . Diastolic HF (heart failure) 11/05/2012  . A-fib   . Echocardiogram abnormal 07/15/12    severe MR,mod to severe TR,mod to severe PA hypertension  . Pulmonary arterial hypertension   . PAF (paroxysmal atrial fibrillation), 1st episode since DCCV in Jan/14 03/19/2013  . Ulcers of both lower extremities, small, now with cellulitis 06/05/2013    Past Surgical History  Procedure Laterality Date  . Cholecystectomy    . Pacemaker placement  02/26/11    Medtronic Adapta  . Knee surgery    . Resection duplicate left kidney    . 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 Hernia repair  1999    ruptured ;  . D& c with hysteroscopy & cervical polypectomy  05/05/12  . Cardioversion N/A 02/06/2013    Successful  . Iud removal  01/30/13  . Ectopic pregnancy surgery  1978  . Cardioversion N/A 06/08/2013    Procedure: CARDIOVERSION;  Surgeon: Sanda Klein, MD;  Location: [place];  Service: Cardiovascular;  Laterality: N/A;  Room [id number]  . Cardioversion N/A 02/01/2014    Procedure: CARDIOVERSION;  Surgeon: Sanda Klein, MD;   Location: [place];  Service: Cardiovascular;  Laterality: N/A;  . Cardioversion N/A 06/02/2014    Procedure: CARDIOVERSION;  Surgeon: Sanda Klein, MD;  Location: [place];  Service: Cardiovascular;  Laterality: N/A;    Arthur Holms, RD, LDN Pager #: [phone] After-Hours Pager #: [phone]

## 2014-09-14 NOTE — Evaluation (Addendum)
Occupational Therapy Evaluation Patient Details Name: [name] MRN: [id number] DOB: May 23, 1950 Today's Date: 09/14/2014    History of Present Illness 64 y.o. female  with history of fibromyalgia, hypothyroidism, solitary kidney, hypothyroidism, diabetes, nonischemic cardiomyopathy with ejection fraction of 35%, atrial fibrillation who was admitted with lower extremity cellulitis.   Clinical Impression   Pt admitted with above. Feel pt will benefit from acute OT to increase independence prior to d/c. Recommending SNF for rehab prior to d/c home. Pt slid off of bed during session to floor with assist from OT/nursing-maximove used to get pt back to bed. Safety zone filled out.    Follow Up Recommendations  SNF;Supervision/Assistance - 24 hour    Equipment Recommendations  Other (comment) (tbd)    Recommendations for Other Services       Precautions / Restrictions Precautions Precautions: Fall Restrictions Weight Bearing Restrictions: No      Mobility Bed Mobility Overal bed mobility: Needs Assistance Bed Mobility: Rolling;Supine to Sit;Sit to Supine Rolling: Mod assist   Supine to sit: +2 for physical assistance;Max assist Sit to supine: +2 for physical assistance;Total assist   General bed mobility comments: cues for technique. Assist with bilateral legs.   Transfers Overall transfer level: Needs assistance Equipment used: Rolling walker (2 wheeled) Transfers: Sit to/from Stand Sit to Stand: +2 physical assistance;Max assist         General transfer comment: pt stood once so nurse could assist with hygiene. Tried to stand another time and pt ended up sliding off bed. Several people came to try to assist, but pt slid off of bed. Maximove used to get pt back to bed.    Balance                                            ADL Overall ADL's : Needs assistance/impaired Eating/Feeding: Independent;Sitting   Grooming: Set up;Sitting   Upper  Body Bathing: Set up;Sitting   Lower Body Bathing: Maximal assistance;Sitting/lateral leans       Lower Body Dressing: Total assistance;Sitting/lateral leans   Toilet Transfer: +2 for physical assistance;Maximal assistance;RW (sit to stand from bed)   Toileting- Clothing Manipulation and Hygiene: +2 for physical assistance;Total assistance;Sit to/from stand         General ADL Comments: Explained to pt to be shifting weight in bed to reduce chance of pressure sores. Explained to be exercising arms in bed (pt states she can't use theraband). Educated on alternative technique for LB ADLs (leaning/rolling).  Pt slid to floor off of bed-lift used to get pt back to bed.     Vision                     Perception     Praxis      Pertinent Vitals/Pain Pain Assessment: Faces Faces Pain Scale: Hurts even more Pain Location: bilateral legs Pain Intervention(s): Monitored during session;Repositioned     Hand Dominance     Extremity/Trunk Assessment Upper Extremity Assessment Upper Extremity Assessment: Generalized weakness   Lower Extremity Assessment Lower Extremity Assessment: Defer to PT evaluation       Communication Communication Communication: No difficulties   Cognition Arousal/Alertness: Awake/alert Behavior During Therapy: Anxious Overall Cognitive Status: Within Functional Limits for tasks assessed                     General Comments  Exercises       Shoulder Instructions      Home Living Family/patient expects to be discharged to:: Private residence Living Arrangements: Spouse/significant other Available Help at Discharge: Family (most of the time) Type of Home: House Home Access: Ramped entrance     Home Layout: One level     Bathroom Shower/Tub: Occupational psychologist: Handicapped height     Home Equipment: Environmental consultant - 4 wheels;Shower seat          Prior Functioning/Environment Level of Independence: Needs  assistance        Comments: states she was doing ADLs herself; getting therapy at home    OT Diagnosis: Acute pain;Generalized weakness   OT Problem List: Decreased activity tolerance;Decreased knowledge of use of DME or AE;Decreased knowledge of precautions;Pain;Obesity;Decreased strength;Decreased range of motion   OT Treatment/Interventions: Self-care/ADL training;Therapeutic exercise;DME and/or AE instruction;Therapeutic activities;Patient/family education;Balance training    OT Goals(Current goals can be found in the care plan section) Acute Rehab OT Goals Patient Stated Goal: not stated OT Goal Formulation: With patient Time For Goal Achievement: 09/28/14 Potential to Achieve Goals: Good  OT Frequency: Min 2X/week   Barriers to D/C:            Co-evaluation              End of Session Equipment Utilized During Treatment: Gait belt;Rolling walker Nurse Communication: Mobility status  Activity Tolerance: Patient limited by pain Patient left: in bed;with family/visitor present;with nursing/sitter in room   Time:  1025-1046/1102-1125 (waited on lift pad) Total Time: 44 minutes   Charges:  OT General Charges $OT Visit: 1 Procedure OT Evaluation $Initial OT Evaluation Tier I: 1 Procedure OT Treatments $Therapeutic Activity: 8-22 mins G-CodesBenito Mccreedy OTR/L [phone] 09/14/2014, 2:55 PM

## 2014-09-14 NOTE — Progress Notes (Signed)
Bilateral thigh dressing changed using santyl and wet to dry, pt refused bed pad change at this time Rickard Rhymes, RN

## 2014-09-14 NOTE — Progress Notes (Signed)
Called to room to find pt on floor in room with OT, stated pt slid to floor after standing to be cleaned up, vitals taken, life used to get pt back to bed, pt stated she did not hit her head  Rickard Rhymes, RN

## 2014-09-14 NOTE — Progress Notes (Signed)
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 C/O "indigestion". UO 3.5 L, net 2.5 L negative.  Weight 342 lb (suspect change was due to new bed, not a real 37 lb weight loss overnight) Renal function markedly better and INR down to 2.0. Persistently hyperglycemic - she used to be on Lantus at home and her glyburide dose has been reduced (in response to acute worsening of renal function, now improved).  CURRENT MEDS . amiodarone  400 mg Oral BID  . calcitRIOL  0.25 mcg Oral BID  . calcium-vitamin D  1 tablet Oral BID  .  ceFAZolin (ANCEF) IV  2 g Intravenous Q12H  . collagenase   Topical Daily  . febuxostat  80 mg Oral Daily  . glyBURIDE  5 mg Oral BID WC  . hydrocortisone sodium succinate  50 mg Intravenous Q12H  . insulin aspart  0-15 Units Subcutaneous TID WC  . iron polysaccharides  150 mg Oral BID  . levothyroxine  125 mcg Oral QAC breakfast  . linagliptin  5 mg Oral Daily  . multivitamin with minerals  1 tablet Oral Daily  . mupirocin cream   Topical Daily  . mupirocin ointment   Topical BID  . saccharomyces boulardii  250 mg Oral BID  . sodium chloride  3 mL Intravenous Q12H  . Warfarin - Pharmacist Dosing Inpatient   Does not apply q1800     OBJECTIVE   Intake/Output Summary (Last 24 hours) at 09/14/14 0848 Last data filed at 09/14/14 0610  Gross per 24 hour  Intake    440 ml  Output   3300 ml  Net  -2860 ml   Filed Weights   09/11/14 0500 09/13/14 0348 09/14/14 0634  Weight: 379 lb 3.1 oz (172 kg) 379 lb 10.1 oz (172.2 kg) 342 lb 3.2 oz (155.221 kg)    PHYSICAL EXAM Filed Vitals:   09/13/14 2112 09/14/14 0604 09/14/14 0634 09/14/14 0808  BP: 104/56 109/54  104/59  Pulse: 85 85  65  Temp: 97.6 F (36.4 C) 97.7 F (36.5 C)  98.5 F (36.9 C)  TempSrc: Oral Oral  Oral  Resp: 18 17    Height:      Weight:   342 lb 3.2 oz (155.221 kg)   SpO2: 97% 98%  96%   General: Alert, oriented x3, no distress Head: no evidence of trauma, PERRL, EOMI, no exophtalmos or lid lag, no myxedema, no xanthelasma; normal ears, nose and oropharynx Neck: 15 mm jugular venous pulsations and prominent V waves ; brisk carotid pulses without delay and no carotid bruits Chest: clear to auscultation, no signs of consolidation by percussion or palpation, normal fremitus, symmetrical and full respiratory excursions Cardiovascular: normal position and quality  of the apical impulse, regular rhythm, normal first and second heart sounds, no rubs or gallops, 3/6 holosystolic murmur murmur Abdomen: no tenderness or distention, no masses by palpation, no abnormal pulsatility or arterial bruits, normal bowel sounds, no hepatosplenomegaly Extremities: massive edema bilaterally, ulcerations on shins do not look erythematous; 2+ radial, ulnar and brachial pulses bilaterally; 2+ posterior tibial and dorsalis pedis pulses; 2+, posterior tibial and dorsalis pedis pulses Neurological: grossly nonfocal  LABS  CBC  Recent Labs  09/12/14 0530 09/13/14 0342 09/14/14 0615  WBC 24.9* 15.2* 10.8*  NEUTROABS 23.8* 14.6*  --   HGB 9.1* 8.7* 8.5*  HCT 28.0* 26.3* 25.5*  MCV 96.9 93.6 92.7  PLT 281 226 353   Basic Metabolic Panel  Recent Labs   09/13/14 0342 09/14/14 0615  NA 133* 137  K 4.0 3.7  CL 95* 97  CO2 27 28  GLUCOSE 168* 225*  BUN 82* 85*  CREATININE 3.18* 2.83*  CALCIUM 8.4 8.4   Liver Function Tests  Recent Labs  09/12/14 0530  AST 55*  ALT 43*  ALKPHOS 192*  BILITOT 1.8*  PROT 5.9*  ALBUMIN 1.4*  Radiology Studies Imaging results have been reviewed and Dg Chest Port 1 View  09/14/2014   CLINICAL DATA:  Airspace disease.  EXAM: PORTABLE CHEST - 1 VIEW  COMPARISON:  09/13/2014.  FINDINGS: Support apparatus: LEFT IJ central line unchanged with the tip in the upper to mid SVC. Pacemaker leads appear unchanged.  Cardiomediastinal Silhouette:  Enlarged, unchanged.  Lungs: Mild improvement in pulmonary edema. Pulmonary vascular congestion persists. Subsegmental atelectasis the LEFT mid lung. No focal consolidation. No pneumothorax.  Effusions:  None.  Other:  None.  IMPRESSION: 1. Stable support apparatus. 2. Unchanged cardiomegaly. 3. Improved central pulmonary edema with persistent pulmonary vascular congestion.   Electronically Signed   By: Dereck Ligas M.D.   On: 09/14/2014 08:10   Dg Chest Port 1 View  09/13/2014   CLINICAL DATA:  Assess edema  EXAM: PORTABLE CHEST - 1 VIEW  COMPARISON:  September 12, 2014  FINDINGS: The heart size and mediastinal contours are stable. The heart size is enlarged. Left central venous line is identified with distal tip in the superior vena cava. Cardiac pacemaker is unchanged. There is enlargement of central pulmonary vessels worse compared to prior exam. There is no focal pneumonia or pleural effusion. The visualized skeletal structures are stable.  IMPRESSION: Slight interval worsened central pulmonary vascular congestion. There is no focal pneumonia or pleural effusion. Cardiomegaly.   Electronically Signed   By: Abelardo Diesel M.D.   On: 09/13/2014 07:31    TELE AF with fair rate control (100-105)   ASSESSMENT AND PLAN  Continue diuretics - hard to say exactly what weight  we should be shooting for with such variable weights during current stay. Probably soon time to stop stress hydrocortisone and restart Lantus - will order about 1/2 of usual Lantus dose. Monitor renal function and LFTs.  Sanda Klein, MD, [facility] [phone] office [phone] pager 09/14/2014 8:48 AM

## 2014-09-15 LAB — COMPREHENSIVE METABOLIC PANEL
ALT: 39 U/L — ABNORMAL HIGH (ref 0–35)
AST: 67 U/L — ABNORMAL HIGH (ref 0–37)
Albumin: 1.4 g/dL — ABNORMAL LOW (ref 3.5–5.2)
Alkaline Phosphatase: 276 U/L — ABNORMAL HIGH (ref 39–117)
Anion gap: 11 (ref 5–15)
BILIRUBIN TOTAL: 0.9 mg/dL (ref 0.3–1.2)
BUN: 90 mg/dL — ABNORMAL HIGH (ref 6–23)
CALCIUM: 8.4 mg/dL (ref 8.4–10.5)
CHLORIDE: 93 meq/L — AB (ref 96–112)
CO2: 28 mEq/L (ref 19–32)
CREATININE: 2.8 mg/dL — AB (ref 0.50–1.10)
GFR calc non Af Amer: 17 mL/min — ABNORMAL LOW (ref >90)
GFR, EST AFRICAN AMERICAN: 19 mL/min — AB (ref >90)
GLUCOSE: 205 mg/dL — AB (ref 70–99)
Potassium: 3.4 mEq/L — ABNORMAL LOW (ref 3.7–5.3)
Sodium: 132 mEq/L — ABNORMAL LOW (ref 137–147)
Total Protein: 6 g/dL (ref 6.0–8.3)

## 2014-09-15 LAB — URINE CULTURE: SPECIAL REQUESTS: NORMAL

## 2014-09-15 LAB — GLUCOSE, CAPILLARY
GLUCOSE-CAPILLARY: 215 mg/dL — AB (ref 70–99)
GLUCOSE-CAPILLARY: 242 mg/dL — AB (ref 70–99)
Glucose-Capillary: 202 mg/dL — ABNORMAL HIGH (ref 70–99)
Glucose-Capillary: 248 mg/dL — ABNORMAL HIGH (ref 70–99)

## 2014-09-15 LAB — PROTIME-INR
INR: 2.28 — ABNORMAL HIGH (ref 0.00–1.49)
Prothrombin Time: 25.1 seconds — ABNORMAL HIGH (ref 11.6–15.2)

## 2014-09-15 MED ORDER — WARFARIN SODIUM 3 MG PO TABS
3.0000 mg | ORAL_TABLET | Freq: Once | ORAL | Status: AC
  Administered 2014-09-15: 3 mg via ORAL
  Filled 2014-09-15: qty 1

## 2014-09-15 MED ORDER — INSULIN GLARGINE 100 UNIT/ML ~~LOC~~ SOLN
25.0000 [IU] | Freq: Every day | SUBCUTANEOUS | Status: DC
  Administered 2014-09-15 – 2014-09-16 (×2): 25 [IU] via SUBCUTANEOUS
  Filled 2014-09-15 (×3): qty 0.25

## 2014-09-15 NOTE — Progress Notes (Signed)
Patient Profile: 64 y/o female with multiple medical problems including moderately severe NICM with an EF of 30-35% with severe mitral insufficiency and severe tricuspid insufficiency and moderate to severe pulmonary hypertension. She is not felt to be a candidate for valve surgery secondary to multiple co-morbidities. Her other problems include PAF-recurrent despite increasing doses of Amiodarone and two cardioversions so far this year, a prior pacemaker, morbid obesity, massive chronic LE edema, DM and CKD with a solitary kidney.   She was admitted 09/09/14 and treated for sepsis in the setting of severe cellulitis. Now significantly volume-overloaded.   Subjective: Tearful regarding overall situation. No complaints of pain. No dyspnea.   Objective: Vital signs in last 24 hours: Temp:  [97.5 F (36.4 C)-98 F (36.7 C)] 98 F (36.7 C) (09/16 0629) Pulse Rate:  [81-89] 82 (09/16 0838) Resp:  [18-19] 19 (09/16 0629) BP: (92-104)/(47-63) 98/47 mmHg (09/16 0838) SpO2:  [95 %-100 %] 95 % (09/16 0629) Weight:  [342 lb 8 oz (155.357 kg)] 342 lb 8 oz (155.357 kg) (09/16 0651) Last BM Date: 09/14/14  Intake/Output from previous day: 09/15 0701 - 09/16 0700 In: 720 [P.O.:720] Out: 2300 [Urine:2300] Intake/Output this shift: Total I/O In: 240 [P.O.:240] Out: -   Medications Current Facility-Administered Medications  Medication Dose Route Frequency Provider Last Rate Last Dose  . 0.9 %  sodium chloride infusion  250 mL Intravenous PRN Ivor Costa, MD 10 mL/hr at 09/11/14 2248 250 mL at 09/11/14 2248  . albuterol (PROVENTIL) (2.5 MG/3ML) 0.083% nebulizer solution 2.5 mg  2.5 mg Nebulization Q6H PRN Ivor Costa, MD      . amiodarone (PACERONE) tablet 400 mg  400 mg Oral BID Ivor Costa, MD   400 mg at 09/14/14 2135  . calcitRIOL (ROCALTROL) capsule 0.25 mcg  0.25 mcg Oral BID Ivor Costa, MD   0.25 mcg at 09/15/14 0115  . calcium carbonate (TUMS - dosed in mg elemental calcium) chewable tablet 200  mg of elemental calcium  1 tablet Oral BID PRN Geradine Girt, DO   200 mg of elemental calcium at 09/14/14 1603  . calcium-vitamin D (OSCAL WITH D) 500-200 MG-UNIT per tablet 1 tablet  1 tablet Oral BID Ivor Costa, MD   1 tablet at 09/14/14 2135  . ceFAZolin (ANCEF) IVPB 2 g/50 mL premix  2 g Intravenous Q12H Wilhelmina Mcardle, MD   2 g at 09/15/14 0115  . collagenase (SANTYL) ointment   Topical Daily Geradine Girt, DO      . febuxostat (ULORIC) tablet 80 mg  80 mg Oral Daily Ivor Costa, MD   80 mg at 09/14/14 0955  . feeding supplement (PRO-STAT SUGAR FREE 64) liquid 30 mL  30 mL Oral BID WC Rogue Bussing, RD   30 mL at 09/14/14 1700  . furosemide (LASIX) injection 80 mg  80 mg Intravenous BID Geradine Girt, DO   80 mg at 09/15/14 0730  . glyBURIDE (DIABETA) tablet 5 mg  5 mg Oral BID WC Wilhelmina Mcardle, MD   5 mg at 09/15/14 0730  . HYDROcodone-acetaminophen (NORCO) 10-325 MG per tablet 1 tablet  1 tablet Oral Q6H PRN Mauri Brooklyn, MD   1 tablet at 09/15/14 0812  . hydrocortisone sodium succinate (SOLU-CORTEF) 100 MG injection 50 mg  50 mg Intravenous Q12H Wilhelmina Mcardle, MD   50 mg at 09/15/14 0836  . hydrOXYzine (ATARAX/VISTARIL) tablet 25 mg  25 mg Oral Q4H PRN Ivor Costa, MD      .  insulin aspart (novoLOG) injection 0-15 Units  0-15 Units Subcutaneous TID WC Geradine Girt, DO   5 Units at 09/15/14 0645  . insulin aspart (novoLOG) injection 0-5 Units  0-5 Units Subcutaneous QHS Geradine Girt, DO   2 Units at 09/14/14 2138  . insulin glargine (LANTUS) injection 15 Units  15 Units Subcutaneous QHS Sanda Klein, MD   15 Units at 09/14/14 2138  . iron polysaccharides (NIFEREX) capsule 150 mg  150 mg Oral BID Ivor Costa, MD   150 mg at 09/14/14 2135  . levothyroxine (SYNTHROID, LEVOTHROID) tablet 125 mcg  125 mcg Oral QAC breakfast Ivor Costa, MD   125 mcg at 09/15/14 0645  . linagliptin (TRADJENTA) tablet 5 mg  5 mg Oral Daily Wilhelmina Mcardle, MD   5 mg at 09/14/14 0955  . multivitamin with  minerals tablet 1 tablet  1 tablet Oral Daily Ivor Costa, MD   1 tablet at 09/12/14 1042  . mupirocin cream (BACTROBAN) 2 %   Topical Daily Geradine Girt, DO   1 application at 10/93/23 2239  . mupirocin ointment (BACTROBAN) 2 %   Topical BID Ivor Costa, MD      . nitroGLYCERIN (NITROSTAT) SL tablet 0.4 mg  0.4 mg Sublingual Q5 min PRN Ivor Costa, MD      . saccharomyces boulardii [facility]) capsule 250 mg  250 mg Oral BID Ivor Costa, MD   250 mg at 09/14/14 2135  . sodium chloride 0.9 % bolus 1,000 mL  1,000 mL Intravenous PRN Erick Colace, NP      . sodium chloride 0.9 % bolus 1,000 mL  1,000 mL Intravenous PRN Erick Colace, NP      . sodium chloride 0.9 % injection 10-40 mL  10-40 mL Intracatheter PRN Raylene Miyamoto, MD   20 mL at 09/15/14 0536  . sodium chloride 0.9 % injection 3 mL  3 mL Intravenous Q12H Ivor Costa, MD   3 mL at 09/14/14 1000  . sodium chloride 0.9 % injection 3 mL  3 mL Intravenous PRN Ivor Costa, MD      . Warfarin - Pharmacist Dosing Inpatient   Does not apply q1800 Saundra Shelling, [facility]        PE: General appearance: alert, cooperative, no distress and morbidly obese Neck: no carotid bruit and no JVD Lungs: bibasilar rales Heart: irregularly irregular rhythm and +murmur heard throughout the precordium Extremities: severely edematous lower extremities  Pulses: 2+ and symmetric Skin: warm and dry Neurologic: Grossly normal  Lab Results:   Recent Labs  09/13/14 0342 09/14/14 0615  WBC 15.2* 10.8*  HGB 8.7* 8.5*  HCT 26.3* 25.5*  PLT 226 220   BMET  Recent Labs  09/13/14 0342 09/14/14 0615 09/15/14 0530  NA 133* 137 132*  K 4.0 3.7 3.4*  CL 95* 97 93*  CO2 27 28 28   GLUCOSE 168* 225* 205*  BUN 82* 85* 90*  CREATININE 3.18* 2.83* 2.80*  CALCIUM 8.4 8.4 8.4   PT/INR  Recent Labs  09/13/14 0342 09/14/14 0615 09/15/14 0530  LABPROT 27.1* 23.3* 25.1*  INR 2.51* 2.07* 2.28*     Assessment/Plan    Principal Problem:   Cellulitis of  Lt lower leg Active Problems:   Sleep apnea- on C-pap   Pulm HTN with severe TR   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 [place]me-overload: Weight is still up 42 lbs. She diuresed 2.3L yesterday. Continue diuresis with IV Lasix. Continue strict I/Os, daily weights and low sodium diet. Continue to monitor BP, renal function and electrolytes.  2. Atrial Fibrillation: rate is controlled in the 70s. Asymptomatic. Continue Amidarone and Warfarin. INR is therapeutic.   3. Hypokalemia: K is 3.4 today. Replete with supplemental K. Monitor closely in the setting of aggressive diuretic therapy.   4. Cellulitis of Lt lower leg: management per TRH.    LOS: 6 days    Brittainy M. Rua, PA-C 09/15/2014 9:16 AM  I have seen and examined the patient along with Brittainy M. Rosita Fire, PA-C.  I have reviewed the chart, notes and new data.  I agree with PA's note.  Key new complaints: improved chest discomfort and a little less tightness in her legs Key examination changes: still with massive edema, loud holosystolic TR murmur and I think an accompanying diastolic flow rumble Key new findings / data: creat stabilizing around 2.8, her recent baseline  PLAN: Continue diuretics - lots of volume still to lose.  Sanda Klein, MD, [place] [phone] 09/15/2014, 1:25 PM

## 2014-09-15 NOTE — Progress Notes (Addendum)
Pt PPM noted to be pacing in QRS and after QRS.  Per strips pt has been doing so during the day as well.  MD on call notified to make aware.  Will continue to monitor.    Md aware.  Will continue to monitor.

## 2014-09-15 NOTE — Progress Notes (Signed)
Pt refused CPAP for tonight and prefers to wear a 2L nasal cannula instead. Pt was encouraged to contact RT if she changed her mind about wearing CPAP.

## 2014-09-15 NOTE — Progress Notes (Signed)
PROGRESS NOTE  [name] JQG:[id number] DOB: May 12, 1950 DOA: 09/09/2014 PCP: Elby Showers, MD  64 y.o. female with history of fibromyalgia, hypothyroidism, solitary kidney, hypothyroidism, diabetes, nonischemic cardiomyopathy with ejection fraction of 35%, atrial fibrillation who was admitted on 9/10 w/ recurrent Lower extremity cellulitis. PCCM asked to see in consult on 9/11 for persistent hypotension and worsening renal failure. Was in ICU on pressors until 9/14  Assessment/Plan: Septic shock due to Left lower leg cellulitis:  - abx - Keep lower extremity elevated.  - blood culture X 2 - 1/2 group B strep - Wound care consult  - pt/ot   . Type 2 diabetes mellitus:  - A1c 7.0  - lantus increased to 25 units.  - SSI while inpatient.  - Follow CBGs   . PAF (paroxysmal atrial fibrillation)  - Currently with a paced rhythm, monitor in telemetry.  - Coumadin to be dosed by pharmacy.  - Continue home medications  . Combined systolic and diastolic congestive heart failure- EF 30-35% June 2014   . Hypothyroidism, acquired, autoimmune: Recent TSH was 2.12 on 05/28/14.  - Continue levothyroxine   Gout: stable  -Continue home medication   . CKD (chronic kidney disease) stage 4, GFR 15-29 ml/min: Cre is slightly higher from baseline 2.5-3.00 to 3.38 on admission  - Monitor closely  - hold diuretics as above.   Marland Kitchen PACEMAKER, PERMANENT  - Monitor in telemetry.   . Obesity hypoventilation syndrome  - CPAP each bedtime   . OBESITY, MORBID   Code Status: full Family Communication: patient Disposition Plan: SNF   Consultants:  PCCM  cards  Procedures:      HPI/Subjective: Very upset about the fall yesterday.  Objective: Filed Vitals:   09/15/14 1346  BP: 98/56  Pulse: 100  Temp: 98.6 F (37 C)  Resp: 20    Intake/Output Summary (Last 24 hours) at 09/15/14 1629 Last data filed at 09/15/14 0900  Gross per 24 hour  Intake    480 ml  Output   2300 ml    Net  -1820 ml   Filed Weights   09/13/14 0348 09/14/14 0634 09/15/14 0651  Weight: 172.2 kg (379 lb 10.1 oz) 155.221 kg (342 lb 3.2 oz) 155.357 kg (342 lb 8 oz)    Exam:   General:  Pleasant/A+Ox3  Cardiovascular: irr  Respiratory: clear, no wheezing  Abdomen: obese  Musculoskeletal: +Edema, less redness on left leg   Data Reviewed: Basic Metabolic Panel:  Recent Labs Lab 09/11/14 0355 09/12/14 0530 09/13/14 0342 09/14/14 0615 09/15/14 0530  NA 132* 133* 133* 137 132*  K 5.1 4.3 4.0 3.7 3.4*  CL 94* 95* 95* 97 93*  CO2 24 26 27 28 28   GLUCOSE 79 140* 168* 225* 205*  BUN 80* 82* 82* 85* 90*  CREATININE 3.99* 3.53* 3.18* 2.83* 2.80*  CALCIUM 8.9 8.4 8.4 8.4 8.4   Liver Function Tests:  Recent Labs Lab 09/10/14 0410 09/10/14 1715 09/12/14 0530 09/15/14 0530  AST 45* 57* 55* 67*  ALT 45* 49* 43* 39*  ALKPHOS 138* 164* 192* 276*  BILITOT 1.5* 1.6* 1.8* 0.9  PROT 6.2 6.2 5.9* 6.0  ALBUMIN 1.6* 1.6* 1.4* 1.4*   No results found for this basename: LIPASE, AMYLASE,  in the last 168 hours No results found for this basename: AMMONIA,  in the last 168 hours CBC:  Recent Labs Lab 09/09/14 1820 09/10/14 0410 09/11/14 0355 09/12/14 0530 09/13/14 0342 09/14/14 0615  WBC 19.0* 18.9* 34.1* 24.9* 15.2* 10.8*  NEUTROABS 18.4* 18.2*  --  23.8* 14.6*  --   HGB 9.6* 8.4* 9.7* 9.1* 8.7* 8.5*  HCT 29.5* 26.1* 30.4* 28.0* 26.3* 25.5*  MCV 98.0 96.0 99.0 96.9 93.6 92.7  PLT 251 214 371 281 226 220   Cardiac Enzymes: No results found for this basename: CKTOTAL, CKMB, CKMBINDEX, TROPONINI,  in the last 168 hours BNP (last 3 results) No results found for this basename: PROBNP,  in the last 8760 hours CBG:  Recent Labs Lab 09/14/14 1141 09/14/14 1611 09/14/14 2031 09/15/14 0627 09/15/14 1136  GLUCAP 231* 240* 233* 202* 215*    Recent Results (from the past 240 hour(s))  CULTURE, BLOOD (ROUTINE X 2)     Status: None   Collection Time    09/09/14  6:20 PM       Result Value Ref Range Status   Specimen Description BLOOD RIGHT ARM   Final   Special Requests BOTTLES DRAWN AEROBIC AND ANAEROBIC 10CC   Final   Culture  Setup Time     Final   Value: 09/09/2014 22:28     Performed at [facility]   Culture     Final   Value: GROUP B STREP(S.AGALACTIAE)ISOLATED     Note: Gram Stain Report Called to,Read Back By and Verified With: ALICIA SPERRY ON 3/50/0938 AT 9:04P BY WILEJ     Performed at [facility]   Report Status 09/12/2014 FINAL   Final   Organism ID, Bacteria GROUP B STREP(S.AGALACTIAE)ISOLATED   Final  CULTURE, BLOOD (ROUTINE X 2)     Status: None   Collection Time    09/09/14  6:35 PM      Result Value Ref Range Status   Specimen Description BLOOD ARM RIGHT   Final   Special Requests BOTTLES DRAWN AEROBIC AND ANAEROBIC 0.2CC   Final   Culture  Setup Time     Final   Value: 09/10/2014 01:00     Performed at [facility]   Culture     Final   Value:        BLOOD CULTURE RECEIVED NO GROWTH TO DATE CULTURE WILL BE HELD FOR 5 DAYS BEFORE ISSUING A FINAL NEGATIVE REPORT     Performed at [facility]   Report Status PENDING   Incomplete  MRSA PCR SCREENING     Status: None   Collection Time    09/10/14  4:02 PM      Result Value Ref Range Status   MRSA by PCR NEGATIVE  NEGATIVE Final   Comment:            The GeneXpert MRSA Assay (FDA     approved for NASAL specimens     only), is one component of a     comprehensive MRSA colonization     surveillance program. It is not     intended to diagnose MRSA     infection nor to guide or     monitor treatment for     MRSA infections.  URINE CULTURE     Status: None   Collection Time    09/10/14  4:02 PM      Result Value Ref Range Status   Specimen Description URINE, CATHETERIZED   Final   Special Requests Normal   Final   Culture  Setup Time     Final   Value: 09/10/2014 17:04     Performed at [facility]     Final  Value:  85,000 COLONIES/ML     Performed at [facility]   Culture     Final   Value: KLEBSIELLA PNEUMONIAE     Performed at [facility]   Report Status 09/15/2014 FINAL   Final   Organism ID, Bacteria KLEBSIELLA PNEUMONIAE   Final     Studies: Dg Chest Port 1 View  09/14/2014   CLINICAL DATA:  Airspace disease.  EXAM: PORTABLE CHEST - 1 VIEW  COMPARISON:  09/13/2014.  FINDINGS: Support apparatus: LEFT IJ central line unchanged with the tip in the upper to mid SVC. Pacemaker leads appear unchanged.  Cardiomediastinal Silhouette:  Enlarged, unchanged.  Lungs: Mild improvement in pulmonary edema. Pulmonary vascular congestion persists. Subsegmental atelectasis the LEFT mid lung. No focal consolidation. No pneumothorax.  Effusions:  None.  Other:  None.  IMPRESSION: 1. Stable support apparatus. 2. Unchanged cardiomegaly. 3. Improved central pulmonary edema with persistent pulmonary vascular congestion.   Electronically Signed   By: Dereck Ligas M.D.   On: 09/14/2014 08:10    Scheduled Meds: . amiodarone  400 mg Oral BID  . calcitRIOL  0.25 mcg Oral BID  . calcium-vitamin D  1 tablet Oral BID  .  ceFAZolin (ANCEF) IV  2 g Intravenous Q12H  . collagenase   Topical Daily  . febuxostat  80 mg Oral Daily  . feeding supplement (PRO-STAT SUGAR FREE 64)  30 mL Oral BID WC  . furosemide  80 mg Intravenous BID  . glyBURIDE  5 mg Oral BID WC  . hydrocortisone sodium succinate  50 mg Intravenous Q12H  . insulin aspart  0-15 Units Subcutaneous TID WC  . insulin aspart  0-5 Units Subcutaneous QHS  . insulin glargine  15 Units Subcutaneous QHS  . iron polysaccharides  150 mg Oral BID  . levothyroxine  125 mcg Oral QAC breakfast  . linagliptin  5 mg Oral Daily  . multivitamin with minerals  1 tablet Oral Daily  . mupirocin cream   Topical Daily  . mupirocin ointment   Topical BID  . saccharomyces boulardii  250 mg Oral BID  . sodium chloride  3 mL Intravenous Q12H  . warfarin  3 mg Oral  ONCE-1800  . Warfarin - Pharmacist Dosing Inpatient   Does not apply q1800   Continuous Infusions:  Antibiotics Given (last 72 hours)   Date/Time Action Medication Dose Rate   09/13/14 1352 Given   ceFAZolin (ANCEF) IVPB 2 g/50 mL premix 2 g 100 mL/hr   09/14/14 0053 Given   ceFAZolin (ANCEF) IVPB 2 g/50 mL premix 2 g 100 mL/hr   09/14/14 1142 Given   ceFAZolin (ANCEF) IVPB 2 g/50 mL premix 2 g 100 mL/hr   09/15/14 0115 Given   ceFAZolin (ANCEF) IVPB 2 g/50 mL premix 2 g 100 mL/hr   09/15/14 1113 Given   ceFAZolin (ANCEF) IVPB 2 g/50 mL premix 2 g 100 mL/hr      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   Obesity hypoventilation syndrome-    Cardiomyopathy, nonischemic- EF 30-35% June 2014   Type 2 diabetes mellitus   Chronic combined systolic and diastolic CHF   Long term (current) use of anticoagulants   CKD (chronic kidney disease) stage 4, GFR 15-29 ml/min   PAF- recurrent despite Amiodarone and multiple cardioversions   Mitral insufficiency, moderate to severe  Chronic massive bilat LE lymphadema   Fall at home- 3 falls this summer, sound orthostatic   Hypotension   Acute on chronic renal failure    Time spent: 35 min    Azhar Yogi  [facility] Pager [phone] 7PM-7AM, please contact night-coverage at [email or web address], password [facility] 09/15/2014, 4:29 PM  LOS: 6 days

## 2014-09-15 NOTE — Clinical Social Work Note (Signed)
Clinical Social Worker discovered patient has already been involved with previous CSW and SNF search has been intiated.  Per chart, previous CSW was able to provide patient and patient sister with available bed offers.  CSW to follow up with patient at bedside to determine bed choice.  CSW remains available for support and to facilitate patient discharge needs once medically ready.  Barbette Or, [place]

## 2014-09-15 NOTE — Progress Notes (Signed)
Inpatient Diabetes Program Recommendations  AACE/ADA: New Consensus Statement on Inpatient Glycemic Control  Target Ranges:  Prepandial:   less than 140 mg/dL      Peak postprandial:   less than 180 mg/dL (1-2 hours)      Critically ill patients:  140 - 180 mg/dL  Pager:  [phone] Hours:  8 am-10pm   Reason for Visit: Elevated glucose:  Results for [name], [name] (MRN [id number]) as of 09/15/2014 12:09  Ref. Range 09/14/2014 11:41 09/14/2014 16:11 09/14/2014 20:31 09/15/2014 06:27 09/15/2014 11:36  Glucose-Capillary Latest Range: 70-99 mg/dL 231 (H) 240 (H) 233 (H) 202 (H) 215 (H)    Inpatient Diabetes Program Recommendations Insulin - Basal: Increase Lantus to home dose: 27 units qhs  Courtney Heys PhD, RN, BC-ADM Diabetes Coordinator  Office:  [phone] Team Pager:  [phone]

## 2014-09-15 NOTE — Evaluation (Signed)
Physical Therapy Evaluation Patient Details Name: [name] MRN: [id number] DOB: [birth year] Today's Date: 09/15/2014   History of Present Illness  64 y.o. female  with history of fibromyalgia, hypothyroidism, solitary kidney, hypothyroidism, diabetes, nonischemic cardiomyopathy with ejection fraction of 35%, atrial fibrillation who presented to the emergency department with left-sided lower leg pain. She was admitted on 9/10 w/ recurrent Lower extremity cellulitis. PCCM asked to see in consult on 9/11 for persistent hypotension and worsening renal failure.  She had a previous admission for LE cellulitis in 07/2014 followed by [facility].  Pt had returned home with HHPT prior to this admission.   Clinical Impression  Pt admitted with above.  Mobility severly limited due to fluid overload BLE and pain BLE.  Pt will benefit from skilled PT to increase their independence and safety with mobility to allow discharge to the venue listed below. Recommending SNF upon d/c due to level of assist needed at time of eval.  Pt unable to support self in stance and requires mechanical lift for transfers.  Pt recently returned home from [facility] SNF stay.        Follow Up Recommendations SNF;Supervision/Assistance - 24 hour    Equipment Recommendations  None recommended by PT    Recommendations for Other Services       Precautions / Restrictions Precautions Precautions: Fall Restrictions Weight Bearing Restrictions: No      Mobility  Bed Mobility Overal bed mobility: Needs Assistance Bed Mobility: Rolling Rolling: Mod assist         General bed mobility comments: use of bedrails for rolling  Transfers                 General transfer comment: pt declined OOB.  She is very fearful of using mechanical lift, stating it tears her skin.  Pt had an assisted fall yesterday requiring maximove for floor to bed. Explained to pt that sling placement and technique would be different for bed to chair  using lift but pt continued to anxiously decline.  Ambulation/Gait                Stairs            Wheelchair Mobility    Modified Rankin (Stroke Patients Only)       Balance                                             Pertinent Vitals/Pain Pain Assessment: Faces Faces Pain Scale: Hurts even more Pain Location: BLE Pain Intervention(s): Monitored during session;Premedicated before session;Limited activity within patient's tolerance    Home Living Family/patient expects to be discharged to:: Private residence Living Arrangements: Spouse/significant other Available Help at Discharge: Family;Available 24 hours/day Type of Home: House Home Access: Ramped entrance     Home Layout: One level Home Equipment: Walker - 4 wheels;Shower seat;Wheelchair - manual      Prior Function Level of Independence: Needs assistance   Gait / Transfers Assistance Needed: supervision for ambulation outside of the home using RW, pt was receiving HHPT.  ADL's / Homemaking Assistance Needed: pt reports independence with ADLs        Hand Dominance        Extremity/Trunk Assessment   Upper Extremity Assessment: Defer to OT evaluation           Lower Extremity Assessment: Generalized weakness (limited ROM due  to size of legs, fluid overload)         Communication   Communication: No difficulties  Cognition Arousal/Alertness: Awake/alert Behavior During Therapy: Anxious Overall Cognitive Status: Within Functional Limits for tasks assessed                      General Comments      Exercises General Exercises - Lower Extremity Ankle Circles/Pumps: AROM;Both;15 reps;Supine Quad Sets: AROM;Both;10 reps;Supine Gluteal Sets: AROM;Both;10 reps;Supine Short Arc Quad: Both;AAROM;10 reps;Supine (UE assist with leg lifter) Hip ABduction/ADduction: AAROM;Both;10 reps;Supine      Assessment/Plan    PT Assessment Patient needs continued  PT services  PT Diagnosis Difficulty walking;Generalized weakness;Acute pain   PT Problem List Decreased strength;Decreased activity tolerance;Decreased balance;Decreased mobility;Pain  PT Treatment Interventions DME instruction;Gait training;Functional mobility training;Therapeutic activities;Therapeutic exercise;Patient/family education;Balance training   PT Goals (Current goals can be found in the Care Plan section) Acute Rehab PT Goals Patient Stated Goal: not stated PT Goal Formulation: With patient Time For Goal Achievement: 09/29/14 Potential to Achieve Goals: Good    Frequency Min 2X/week   Barriers to discharge        Co-evaluation               End of Session   Activity Tolerance: Patient limited by pain Patient left: in bed Nurse Communication: Mobility status         Time: 2979-8921 PT Time Calculation (min): 16 min   Charges:   PT Evaluation $Initial PT Evaluation Tier I: 1 Procedure PT Treatments $Therapeutic Exercise: 8-22 mins   PT G Codes:          Lorriane Shire 09/15/2014, 10:12 AM

## 2014-09-15 NOTE — Progress Notes (Signed)
ANTICOAGULATION CONSULT NOTE - Follow Up Consult  Pharmacy Consult:  Coumadin Indication: atrial fibrillation  Allergies  Allergen Reactions  . Ivp Dye [Iodinated Diagnostic Agents] Shortness Of Breath    Turn red, can't breathe  . Betadine [Povidone Iodine]   . Diphenhydramine Hcl Swelling    In hands and eyes  . Fish Allergy Nausea And Vomiting  . Iohexol      Desc: PT TURNS RED AND WHEEZING   . Penicillins Other (See Comments)    Resp arrest as child. Tolerates cephalosporins.  . Povidone-Iodine Other (See Comments)    Wheezing, turn red, can't breath, and blisters  . Promethazine Hcl Other (See Comments)    Low Blood Pressure  . Tape     Blisters, can use paper tape for short periods  . Clindamycin Hives and Rash    wheezing  . Iodine Rash  . Morphine Sulfate Nausea And Vomiting and Rash  . Primaxin [Imipenem] Rash    Patient Measurements: Height: 5' 6.93" (170 cm) Weight: 342 lb 8 oz (155.357 kg) IBW/kg (Calculated) : 61.44  Vital Signs: Temp: 98 F (36.7 C) (09/16 0629) Temp src: Oral (09/16 0629) BP: 98/47 mmHg (09/16 0838) Pulse Rate: 82 (09/16 0838)  Labs:  Recent Labs  09/13/14 0342 09/14/14 0615 09/15/14 0530  HGB 8.7* 8.5*  --   HCT 26.3* 25.5*  --   PLT 226 220  --   APTT 46*  --   --   LABPROT 27.1* 23.3* 25.1*  INR 2.51* 2.07* 2.28*  CREATININE 3.18* 2.83* 2.80*    Estimated Creatinine Clearance: 30.9 ml/min (by C-G formula based on Cr of 2.8).    Assessment: 55 YOF continues on Coumadin from PTA for history of Afib.  INR supra-therapeutic (3.4) on admit, then peaked at 5 likely due to drug-drug interactions with Cipro and Flagyl.  She then received Vitamin K 1mg  IV x 1 on 09/12/14.  INR therapeutic; no bleeding reported.  Noted patient fell yesterday but did not hit her head per documentation.   Goal of Therapy:  INR 2-3    Plan:  - Coumadin 3mg  PO today - Daily PT / INR - Continue Ancef 2gm IV Q12H - Monitor renal fxn,  clinical progress - Consider increasing Lantus further for better glycemic control and starting maintenance KCL supplementation while on scheduled Lasix    Brittnye Josephs D. Mina Marble, PharmD, BCPS Pager:  [phone] 09/15/2014, 9:32 AM

## 2014-09-16 DIAGNOSIS — Z905 Acquired absence of kidney: Secondary | ICD-10-CM

## 2014-09-16 DIAGNOSIS — I079 Rheumatic tricuspid valve disease, unspecified: Secondary | ICD-10-CM

## 2014-09-16 LAB — BASIC METABOLIC PANEL
ANION GAP: 12 (ref 5–15)
BUN: 88 mg/dL — ABNORMAL HIGH (ref 6–23)
CO2: 28 meq/L (ref 19–32)
Calcium: 8 mg/dL — ABNORMAL LOW (ref 8.4–10.5)
Chloride: 92 mEq/L — ABNORMAL LOW (ref 96–112)
Creatinine, Ser: 2.52 mg/dL — ABNORMAL HIGH (ref 0.50–1.10)
GFR calc Af Amer: 22 mL/min — ABNORMAL LOW (ref >90)
GFR calc non Af Amer: 19 mL/min — ABNORMAL LOW (ref >90)
Glucose, Bld: 224 mg/dL — ABNORMAL HIGH (ref 70–99)
Potassium: 3.3 mEq/L — ABNORMAL LOW (ref 3.7–5.3)
SODIUM: 132 meq/L — AB (ref 137–147)

## 2014-09-16 LAB — CULTURE, BLOOD (ROUTINE X 2): Culture: NO GROWTH

## 2014-09-16 LAB — PROTIME-INR
INR: 2.63 — ABNORMAL HIGH (ref 0.00–1.49)
Prothrombin Time: 28.1 seconds — ABNORMAL HIGH (ref 11.6–15.2)

## 2014-09-16 LAB — GLUCOSE, CAPILLARY
GLUCOSE-CAPILLARY: 230 mg/dL — AB (ref 70–99)
GLUCOSE-CAPILLARY: 215 mg/dL — AB (ref 70–99)
Glucose-Capillary: 233 mg/dL — ABNORMAL HIGH (ref 70–99)
Glucose-Capillary: 221 mg/dL — ABNORMAL HIGH (ref 70–99)

## 2014-09-16 MED ORDER — METOLAZONE 2.5 MG PO TABS
2.5000 mg | ORAL_TABLET | Freq: Every day | ORAL | Status: DC
  Filled 2014-09-16: qty 1

## 2014-09-16 MED ORDER — PREDNISONE 20 MG PO TABS
40.0000 mg | ORAL_TABLET | Freq: Every day | ORAL | Status: DC
  Filled 2014-09-16: qty 2

## 2014-09-16 MED ORDER — WARFARIN SODIUM 2.5 MG PO TABS
2.5000 mg | ORAL_TABLET | Freq: Once | ORAL | Status: AC
  Administered 2014-09-16: 2.5 mg via ORAL
  Filled 2014-09-16: qty 1

## 2014-09-16 MED ORDER — POTASSIUM CHLORIDE CRYS ER 20 MEQ PO TBCR
40.0000 meq | EXTENDED_RELEASE_TABLET | Freq: Once | ORAL | Status: AC
  Administered 2014-09-16: 40 meq via ORAL
  Filled 2014-09-16: qty 2

## 2014-09-16 NOTE — Clinical Social Work Note (Signed)
Clinical Social Worker continuing to follow patient and family for support and discharge planning needs.  CSW spoke at length with patient at bedside who shared experiences of previous SNF stays and her journey with her continued medical concerns.  Patient is coping well with hospitalization and reality of returning to SNF at discharge, however is only agreeable to [facility] and [facility] at this time.  CSW has updated clinical information and sent referral to both facilities.  CSW to follow up with patient at bedside to provide potential bed offers and facilitate patient discharge needs once medically stable.  CSW remains available for support as needed.  Barbette Or, [place]

## 2014-09-16 NOTE — Progress Notes (Signed)
Patient Profile: 64 y/o female with multiple medical problems including moderately severe NICM with an EF of 30-35% with severe mitral insufficiency and severe tricuspid insufficiency and moderate to severe pulmonary hypertension. She is not felt to be a candidate for valve surgery secondary to multiple co-morbidities. Her other problems include PAF-recurrent despite increasing doses of Amiodarone and two cardioversions so far this year, a prior pacemaker, morbid obesity, massive chronic LE edema, DM and CKD with a solitary kidney.   She was admitted 09/09/14 and treated for sepsis in the setting of severe cellulitis. Now significantly volume-overloaded.   Subjective: Doing a bit better. Legs still feel "thight and tingly". No dyspnea. She had an episode of indigestion last PM, relieved with TUMS.   Objective: Vital signs in last 24 hours: Temp:  [97.5 F (36.4 C)-98.6 F (37 C)] 97.7 F (36.5 C) (09/17 0449) Pulse Rate:  [79-100] 81 (09/17 0736) Resp:  [19-20] 19 (09/17 0449) BP: (95-122)/(54-63) 104/54 mmHg (09/17 0736) SpO2:  [96 %-99 %] 96 % (09/17 0449) Weight:  [345 lb 3.9 oz (156.6 kg)] 345 lb 3.9 oz (156.6 kg) (09/17 0449) Last BM Date: 09/14/14  Intake/Output from previous day: 09/16 0701 - 09/17 0700 In: 670 [P.O.:480; I.V.:140; IV Piggyback:50] Out: 2425 [Urine:2425] Intake/Output this shift:    Medications Current Facility-Administered Medications  Medication Dose Route Frequency Provider Last Rate Last Dose  . 0.9 %  sodium chloride infusion  250 mL Intravenous PRN Ivor Costa, MD 10 mL/hr at 09/16/14 0500 250 mL at 09/16/14 0500  . albuterol (PROVENTIL) (2.5 MG/3ML) 0.083% nebulizer solution 2.5 mg  2.5 mg Nebulization Q6H PRN Ivor Costa, MD      . amiodarone (PACERONE) tablet 400 mg  400 mg Oral BID Ivor Costa, MD   400 mg at 09/15/14 2226  . calcitRIOL (ROCALTROL) capsule 0.25 mcg  0.25 mcg Oral BID Ivor Costa, MD   0.25 mcg at 09/15/14 2225  . calcium carbonate (TUMS -  dosed in mg elemental calcium) chewable tablet 200 mg of elemental calcium  1 tablet Oral BID PRN Geradine Girt, DO   200 mg of elemental calcium at 09/14/14 1603  . calcium-vitamin D (OSCAL WITH D) 500-200 MG-UNIT per tablet 1 tablet  1 tablet Oral BID Ivor Costa, MD   1 tablet at 09/15/14 2226  . ceFAZolin (ANCEF) IVPB 2 g/50 mL premix  2 g Intravenous Q12H Wilhelmina Mcardle, MD   2 g at 09/16/14 0023  . collagenase (SANTYL) ointment   Topical Daily Geradine Girt, DO      . febuxostat (ULORIC) tablet 80 mg  80 mg Oral Daily Ivor Costa, MD   80 mg at 09/15/14 1017  . feeding supplement (PRO-STAT SUGAR FREE 64) liquid 30 mL  30 mL Oral BID WC Rogue Bussing, RD   30 mL at 09/15/14 1200  . furosemide (LASIX) injection 80 mg  80 mg Intravenous BID Geradine Girt, DO   80 mg at 09/16/14 0732  . glyBURIDE (DIABETA) tablet 5 mg  5 mg Oral BID WC Wilhelmina Mcardle, MD   5 mg at 09/16/14 0732  . HYDROcodone-acetaminophen (NORCO) 10-325 MG per tablet 1 tablet  1 tablet Oral Q6H PRN Mauri Brooklyn, MD   1 tablet at 09/15/14 1822  . hydrocortisone sodium succinate (SOLU-CORTEF) 100 MG injection 50 mg  50 mg Intravenous Q12H Wilhelmina Mcardle, MD   50 mg at 09/16/14 0830  . hydrOXYzine (ATARAX/VISTARIL) tablet 25 mg  25 mg Oral  Q4H PRN Ivor Costa, MD      . insulin aspart (novoLOG) injection 0-15 Units  0-15 Units Subcutaneous TID WC Geradine Girt, DO   5 Units at 09/16/14 [phone]  . insulin aspart (novoLOG) injection 0-5 Units  0-5 Units Subcutaneous QHS Geradine Girt, DO   2 Units at 09/15/14 2227  . insulin glargine (LANTUS) injection 25 Units  25 Units Subcutaneous QHS Hosie Poisson, MD   25 Units at 09/15/14 2228  . iron polysaccharides (NIFEREX) capsule 150 mg  150 mg Oral BID Ivor Costa, MD   150 mg at 09/15/14 1016  . levothyroxine (SYNTHROID, LEVOTHROID) tablet 125 mcg  125 mcg Oral QAC breakfast Ivor Costa, MD   125 mcg at 09/16/14 0630  . linagliptin (TRADJENTA) tablet 5 mg  5 mg Oral Daily Wilhelmina Mcardle,  MD   5 mg at 09/15/14 1016  . multivitamin with minerals tablet 1 tablet  1 tablet Oral Daily Ivor Costa, MD   1 tablet at 09/12/14 1042  . mupirocin cream (BACTROBAN) 2 %   Topical Daily Geradine Girt, DO      . mupirocin ointment (BACTROBAN) 2 %   Topical BID Ivor Costa, MD      . nitroGLYCERIN (NITROSTAT) SL tablet 0.4 mg  0.4 mg Sublingual Q5 min PRN Ivor Costa, MD      . saccharomyces boulardii [facility]) capsule 250 mg  250 mg Oral BID Ivor Costa, MD   250 mg at 09/15/14 2225  . sodium chloride 0.9 % bolus 1,000 mL  1,000 mL Intravenous PRN Erick Colace, NP      . sodium chloride 0.9 % bolus 1,000 mL  1,000 mL Intravenous PRN Erick Colace, NP      . sodium chloride 0.9 % injection 10-40 mL  10-40 mL Intracatheter PRN Raylene Miyamoto, MD   10 mL at 09/16/14 6222  . sodium chloride 0.9 % injection 3 mL  3 mL Intravenous Q12H Ivor Costa, MD   3 mL at 09/15/14 1000  . sodium chloride 0.9 % injection 3 mL  3 mL Intravenous PRN Ivor Costa, MD      . Warfarin - Pharmacist Dosing Inpatient   Does not apply q1800 Saundra Shelling, [facility]        PE: General appearance: alert, cooperative, no distress and morbidly obese Neck: no carotid bruit and mild JVD Lungs: bibasilar rales Heart: irregularly irregular rhythm and +murmur heard throughout the precordium Extremities: severely edematous lower extremities  Pulses: 2+ and symmetric Skin: warm and dry Neurologic: Grossly normal  Lab Results:   Recent Labs  09/14/14 0615  WBC 10.8*  HGB 8.5*  HCT 25.5*  PLT 220   BMET  Recent Labs  09/14/14 0615 09/15/14 0530 09/16/14 0608  NA 137 132* 132*  K 3.7 3.4* 3.3*  CL 97 93* 92*  CO2 28 28 28   GLUCOSE 225* 205* 224*  BUN 85* 90* 88*  CREATININE 2.83* 2.80* 2.52*  CALCIUM 8.4 8.4 8.0*   PT/INR  Recent Labs  09/14/14 0615 09/15/14 0530 09/16/14 0608  LABPROT 23.3* 25.1* 28.1*  INR 2.07* 2.28* 2.63*   Filed Weights   09/14/14 0634 09/15/14 0651 09/16/14 0449  Weight: 342  lb 3.2 oz (155.221 kg) 342 lb 8 oz (155.357 kg) 345 lb 3.9 oz (156.6 kg)    Assessment/Plan    Principal Problem:   Cellulitis of Lt lower leg Active Problems:   Sleep apnea- on C-pap   Pulm  HTN with severe TR   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 Voulme-overload: Weight is still up 45 lbs. She diuresed 2.4L yesterday. Continue diuresis with IV Lasix. Continue strict I/Os, daily weights and low sodium diet. Continue to monitor BP, renal function and electrolytes.  2. Atrial Fibrillation: rate is controlled in the 80s. Asymptomatic. Continue Amidarone and Warfarin. INR is therapeutic.   3. Hypokalemia: K is 3.3 today. Will replete with supplemental K. Monitor closely in the setting of aggressive diuretic therapy.   4. Cellulitis of Lt lower leg: management per TRH.   5. CKD: SCr is improved from 2.8 to 2.5. Continue to monitor closely in the setting of diuretic therapy.    LOS: 7 days    Brittainy M. Wiegel, PA-C 09/16/2014 9:02 AM

## 2014-09-16 NOTE — Progress Notes (Signed)
See my note also Sanda Klein, MD, [facility] [phone] office [phone] pager

## 2014-09-16 NOTE — Progress Notes (Signed)
PROGRESS NOTE  [name] EXH:[id number] DOB: 22-May-1950 DOA: 09/09/2014 PCP: Elby Showers, MD  64 y.o. female with history of fibromyalgia, hypothyroidism, solitary kidney, hypothyroidism, diabetes, nonischemic cardiomyopathy with ejection fraction of 35%, atrial fibrillation who was admitted on 9/10 w/ recurrent Lower extremity cellulitis. PCCM asked to see in consult on 9/11 for persistent hypotension and worsening renal failure. Was in ICU on pressors until 9/14  Assessment/Plan: Septic shock due to Left lower leg cellulitis:  - abx cefazolin from 9/14  - Keep lower extremity elevated.  - blood culture X 2 - 1/2 group B strep - Wound care consult  - pt/ot  - steroids stopped.   . Type 2 diabetes mellitus:  - A1c 7.0  - lantus increased to 25 units.  - SSI while inpatient.  - Follow CBGs   . PAF (paroxysmal atrial fibrillation)  - Currently with a paced rhythm, monitor in telemetry.  - Coumadin to be dosed by pharmacy.  - Continue home medications  . Combined systolic and diastolic congestive heart failure- EF 30-35% June 2014   . Hypothyroidism, acquired, autoimmune: Recent TSH was 2.12 on 05/28/14.  - Continue levothyroxine   Gout: stable  -Continue home medication   . CKD (chronic kidney disease) stage 4, GFR 15-29 ml/min: Cre is slightly higher from baseline 2.5-3.00 to 3.38 on admission  - Monitor closely     . PACEMAKER, PERMANENT  - Monitor in telemetry.   . Obesity hypoventilation syndrome  - CPAP each bedtime   . OBESITY, MORBID   Code Status: full Family Communication: patient Disposition Plan: SNF   Consultants:  PCCM  cards  Procedures:  none    HPI/Subjective: Feels a little better, we talked about stopping solumedrol,. Her cbgs are better controlled.  Objective: Filed Vitals:   09/16/14 0736  BP: 104/54  Pulse: 81  Temp:   Resp:     Intake/Output Summary (Last 24 hours) at 09/16/14 1445 Last data filed at 09/16/14  0744  Gross per 24 hour  Intake    670 ml  Output   2425 ml  Net  -1755 ml   Filed Weights   09/14/14 0634 09/15/14 0651 09/16/14 0449  Weight: 155.221 kg (342 lb 3.2 oz) 155.357 kg (342 lb 8 oz) 156.6 kg (345 lb 3.9 oz)    Exam:   General:  Pleasant/A+Ox3  Cardiovascular: irr  Respiratory: clear, no wheezing  Abdomen: obese  Musculoskeletal: +Edema, less redness on left leg   Data Reviewed: Basic Metabolic Panel:  Recent Labs Lab 09/12/14 0530 09/13/14 0342 09/14/14 0615 09/15/14 0530 09/16/14 0608  NA 133* 133* 137 132* 132*  K 4.3 4.0 3.7 3.4* 3.3*  CL 95* 95* 97 93* 92*  CO2 26 27 28 28 28   GLUCOSE 140* 168* 225* 205* 224*  BUN 82* 82* 85* 90* 88*  CREATININE 3.53* 3.18* 2.83* 2.80* 2.52*  CALCIUM 8.4 8.4 8.4 8.4 8.0*   Liver Function Tests:  Recent Labs Lab 09/10/14 0410 09/10/14 1715 09/12/14 0530 09/15/14 0530  AST 45* 57* 55* 67*  ALT 45* 49* 43* 39*  ALKPHOS 138* 164* 192* 276*  BILITOT 1.5* 1.6* 1.8* 0.9  PROT 6.2 6.2 5.9* 6.0  ALBUMIN 1.6* 1.6* 1.4* 1.4*   No results found for this basename: LIPASE, AMYLASE,  in the last 168 hours No results found for this basename: AMMONIA,  in the last 168 hours CBC:  Recent Labs Lab 09/09/14 1820 09/10/14 0410 09/11/14 0355 09/12/14 0530 09/13/14 0342 09/14/14 3810  WBC 19.0* 18.9* 34.1* 24.9* 15.2* 10.8*  NEUTROABS 18.4* 18.2*  --  23.8* 14.6*  --   HGB 9.6* 8.4* 9.7* 9.1* 8.7* 8.5*  HCT 29.5* 26.1* 30.4* 28.0* 26.3* 25.5*  MCV 98.0 96.0 99.0 96.9 93.6 92.7  PLT 251 214 371 281 226 220   Cardiac Enzymes: No results found for this basename: CKTOTAL, CKMB, CKMBINDEX, TROPONINI,  in the last 168 hours BNP (last 3 results) No results found for this basename: PROBNP,  in the last 8760 hours CBG:  Recent Labs Lab 09/15/14 1136 09/15/14 1629 09/15/14 2145 09/16/14 0618 09/16/14 1122  GLUCAP 215* 248* 242* 233* 221*    Recent Results (from the past 240 hour(s))  CULTURE, BLOOD  (ROUTINE X 2)     Status: None   Collection Time    09/09/14  6:20 PM      Result Value Ref Range Status   Specimen Description BLOOD RIGHT ARM   Final   Special Requests BOTTLES DRAWN AEROBIC AND ANAEROBIC 10CC   Final   Culture  Setup Time     Final   Value: 09/09/2014 22:28     Performed at [place]     Final   Value: GROUP B STREP(S.AGALACTIAE)ISOLATED     Note: Gram Stain Report Called to,Read Back By and Verified With: ALICIA SPERRY ON 06/27/3661 AT 9:04P BY WILEJ     Performed at [facility]   Report Status 09/12/2014 FINAL   Final   Organism ID, Bacteria GROUP B STREP(S.AGALACTIAE)ISOLATED   Final  CULTURE, BLOOD (ROUTINE X 2)     Status: None   Collection Time    09/09/14  6:35 PM      Result Value Ref Range Status   Specimen Description BLOOD ARM RIGHT   Final   Special Requests BOTTLES DRAWN AEROBIC AND ANAEROBIC 0.2CC   Final   Culture  Setup Time     Final   Value: 09/10/2014 01:00     Performed at [facility]   Culture     Final   Value: NO GROWTH 5 DAYS     Performed at [facility]   Report Status 09/16/2014 FINAL   Final  MRSA PCR SCREENING     Status: None   Collection Time    09/10/14  4:02 PM      Result Value Ref Range Status   MRSA by PCR NEGATIVE  NEGATIVE Final   Comment:            The GeneXpert MRSA Assay (FDA     approved for NASAL specimens     only), is one component of a     comprehensive MRSA colonization     surveillance program. It is not     intended to diagnose MRSA     infection nor to guide or     monitor treatment for     MRSA infections.  URINE CULTURE     Status: None   Collection Time    09/10/14  4:02 PM      Result Value Ref Range Status   Specimen Description URINE, CATHETERIZED   Final   Special Requests Normal   Final   Culture  Setup Time     Final   Value: 09/10/2014 17:04     Performed at [place]     Final   Value: 85,000 COLONIES/ML      Performed at [facility]   Culture     Final   Value: KLEBSIELLA PNEUMONIAE     Performed at [facility]   Report Status 09/15/2014 FINAL   Final   Organism ID, Bacteria KLEBSIELLA PNEUMONIAE   Final  CULTURE, BLOOD (ROUTINE X 2)     Status: None   Collection Time    09/15/14  1:21 PM      Result Value Ref Range Status   Specimen Description BLOOD RIGHT ANTECUBITAL   Final   Special Requests BOTTLES DRAWN AEROBIC ONLY 5CC   Final   Culture  Setup Time     Final   Value: 09/15/2014 18:00     Performed at [facility]   Culture     Final   Value:        BLOOD CULTURE RECEIVED NO GROWTH TO DATE CULTURE WILL BE HELD FOR 5 DAYS BEFORE ISSUING A FINAL NEGATIVE REPORT     Performed at [facility]   Report Status PENDING   Incomplete  CULTURE, BLOOD (ROUTINE X 2)     Status: None   Collection Time    09/15/14  1:35 PM      Result Value Ref Range Status   Specimen Description BLOOD LEFT ANTECUBITAL   Final   Special Requests BOTTLES DRAWN AEROBIC AND ANAEROBIC 10CC   Final   Culture  Setup Time     Final   Value: 09/15/2014 18:02     Performed at [facility]   Culture     Final   Value:        BLOOD CULTURE RECEIVED NO GROWTH TO DATE CULTURE WILL BE HELD FOR 5 DAYS BEFORE ISSUING A FINAL NEGATIVE REPORT     Performed at [facility]   Report Status PENDING   Incomplete     Studies: No results found.  Scheduled Meds: . amiodarone  400 mg Oral BID  . calcitRIOL  0.25 mcg Oral BID  . calcium-vitamin D  1 tablet Oral BID  .  ceFAZolin (ANCEF) IV  2 g Intravenous Q12H  . collagenase   Topical Daily  . febuxostat  80 mg Oral Daily  . feeding supplement (PRO-STAT SUGAR FREE 64)  30 mL Oral BID WC  . furosemide  80 mg Intravenous BID  . glyBURIDE  5 mg Oral BID WC  . insulin aspart  0-15 Units Subcutaneous TID WC  . insulin aspart  0-5 Units Subcutaneous QHS  . insulin glargine  25 Units Subcutaneous QHS  . iron polysaccharides  150  mg Oral BID  . levothyroxine  125 mcg Oral QAC breakfast  . linagliptin  5 mg Oral Daily  . [START ON 09/17/2014] metolazone  2.5 mg Oral Daily  . multivitamin with minerals  1 tablet Oral Daily  . mupirocin cream   Topical Daily  . mupirocin ointment   Topical BID  . saccharomyces boulardii  250 mg Oral BID  . sodium chloride  3 mL Intravenous Q12H  . warfarin  2.5 mg Oral ONCE-1800  . Warfarin - Pharmacist Dosing Inpatient   Does not apply q1800   Continuous Infusions:  Antibiotics Given (last 72 hours)   Date/Time Action Medication Dose Rate   09/14/14 0053 Given   ceFAZolin (ANCEF) IVPB 2 g/50 mL premix 2 g 100 mL/hr   09/14/14 1142 Given   ceFAZolin (ANCEF) IVPB 2 g/50 mL premix 2 g 100 mL/hr   09/15/14 0115 Given   ceFAZolin (ANCEF) IVPB 2  g/50 mL premix 2 g 100 mL/hr   09/15/14 1113 Given   ceFAZolin (ANCEF) IVPB 2 g/50 mL premix 2 g 100 mL/hr   09/16/14 0023 Given   ceFAZolin (ANCEF) IVPB 2 g/50 mL premix 2 g 100 mL/hr   09/16/14 1109 Given   ceFAZolin (ANCEF) IVPB 2 g/50 mL premix 2 g 100 mL/hr      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   Obesity hypoventilation syndrome-    Cardiomyopathy, nonischemic- EF 30-35% June 2014   Type 2 diabetes mellitus   Chronic combined systolic and diastolic CHF   Long term (current) use of anticoagulants   CKD (chronic kidney disease) stage 4, GFR 15-29 ml/min   PAF- recurrent despite Amiodarone and multiple cardioversions   Mitral insufficiency, moderate to severe   Chronic massive bilat LE lymphadema   Fall at home- 3 falls this summer, sound orthostatic   Hypotension   Acute on chronic renal failure    Time spent: 35 min    [name]  [facility] Pager [phone] 7PM-7AM, please contact night-coverage at [email or web address], password [facility] 09/16/2014, 2:45 PM  LOS: 7 days

## 2014-09-16 NOTE — Progress Notes (Signed)
Pt refused CPAP for tonight and prefers to wear nasal cannula instead. Pt was encouraged to contact RT if she changes her mind about wearing CPAP.

## 2014-09-16 NOTE — Progress Notes (Signed)
ANTICOAGULATION / ANTIBIOTIC CONSULT NOTE - Follow Up Consult  Pharmacy Consult:  Coumadin + Ancef Indication: atrial fibrillation + LLE cellulitis  Allergies  Allergen Reactions  . Ivp Dye [Iodinated Diagnostic Agents] Shortness Of Breath    Turn red, can't breathe  . Betadine [Povidone Iodine]   . Diphenhydramine Hcl Swelling    In hands and eyes  . Fish Allergy Nausea And Vomiting  . Iohexol      Desc: PT TURNS RED AND WHEEZING   . Penicillins Other (See Comments)    Resp arrest as child. Tolerates cephalosporins.  . Povidone-Iodine Other (See Comments)    Wheezing, turn red, can't breath, and blisters  . Promethazine Hcl Other (See Comments)    Low Blood Pressure  . Tape     Blisters, can use paper tape for short periods  . Clindamycin Hives and Rash    wheezing  . Iodine Rash  . Morphine Sulfate Nausea And Vomiting and Rash  . Primaxin [Imipenem] Rash    Patient Measurements: Height: 5' 6.93" (170 cm) Weight: 345 lb 3.9 oz (156.6 kg) IBW/kg (Calculated) : 61.44  Vital Signs: Temp: 97.7 F (36.5 C) (09/17 0449) Temp src: Oral (09/17 0449) BP: 104/54 mmHg (09/17 0736) Pulse Rate: 81 (09/17 0736)  Labs:  Recent Labs  09/14/14 0615 09/15/14 0530 09/16/14 0608  HGB 8.5*  --   --   HCT 25.5*  --   --   PLT 220  --   --   LABPROT 23.3* 25.1* 28.1*  INR 2.07* 2.28* 2.63*  CREATININE 2.83* 2.80* 2.52*    Estimated Creatinine Clearance: 34.5 ml/min (by C-G formula based on Cr of 2.52).    Assessment: 68 YOF continues on Coumadin from PTA for history of Afib.  INR supra-therapeutic (3.4) on admit, then peaked at 5 likely due to drug-drug interactions with Cipro and Flagyl.  She then received Vitamin K 1mg  IV x 1 on 09/12/14.  INR therapeutic; no bleeding reported.    Patient continues on Ancef for LLE cellulitis and Strep agalactiae bacteremia.  Patient's renal function is improving.  Vanc 9/10 >> 9/14 LVQ 9/12 >> 9/13 Flagyl 9/10 >> 9/12 Cipro 9/11 >>  9/12 Ancef 9/14 >>  9/16 BCx x2 - 9/10 bcx x2 - GBS In 1/2 (sens to PCN) 9/11 urine - 85K GNR   Goal of Therapy:  INR 2-3 Clearance of infection    Plan:  - Coumadin 2.5mg  PO today - Daily PT / INR - Continue Ancef 2gm IV Q12H - Monitor renal fxn, clinical progress - Consider starting maintenance KCL while on scheduled IV Lasix    Amori Cooperman D. Mina Marble, PharmD, BCPS Pager:  [phone] 09/16/2014, 1:05 PM

## 2014-09-16 NOTE — Progress Notes (Signed)
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 Very irritable "due to steroids". Slow progression of diuresis, still grossly hypervolemic with weeping wounds right leg and edema to the groin. K 3.3. Creat better than ever at 2.5. Therapeutic INR  CURRENT MEDS . amiodarone  400 mg Oral BID  . calcitRIOL  0.25 mcg Oral BID  . calcium-vitamin D  1 tablet Oral BID  .  ceFAZolin (ANCEF) IV  2 g Intravenous Q12H  . collagenase   Topical Daily  . febuxostat  80 mg Oral Daily  . feeding supplement (PRO-STAT SUGAR FREE 64)  30 mL Oral BID WC  . furosemide  80 mg Intravenous BID  . glyBURIDE  5 mg Oral BID WC  . insulin aspart  0-15 Units Subcutaneous TID WC  . insulin aspart  0-5 Units Subcutaneous QHS  . insulin glargine  25 Units Subcutaneous QHS  . iron polysaccharides  150 mg Oral BID  . levothyroxine  125 mcg Oral QAC breakfast  . linagliptin  5 mg Oral Daily  . multivitamin with minerals  1 tablet Oral Daily  . mupirocin cream   Topical Daily  . mupirocin ointment   Topical BID  . [START ON 09/17/2014] predniSONE  40 mg Oral Q breakfast  . saccharomyces boulardii  250 mg Oral BID  . sodium chloride  3 mL Intravenous Q12H  . Warfarin - Pharmacist Dosing Inpatient    Does not apply q1800    OBJECTIVE   Intake/Output Summary (Last 24 hours) at 09/16/14 1027 Last data filed at 09/16/14 0626  Gross per 24 hour  Intake    430 ml  Output   2425 ml  Net  -1995 ml   Filed Weights   09/14/14 0634 09/15/14 0651 09/16/14 0449  Weight: 155.221 kg (342 lb 3.2 oz) 155.357 kg (342 lb 8 oz) 156.6 kg (345 lb 3.9 oz)    PHYSICAL EXAM Filed Vitals:   09/15/14 1346 09/15/14 2145 09/16/14 0449 09/16/14 0736  BP: 98/56 122/63 95/56 104/54  Pulse: 100 100 79 81  Temp: 98.6 F (37 C) 97.5 F (36.4 C) 97.7 F (36.5 C)   TempSrc: Oral Oral Oral   Resp: 20 19 19    Height:      Weight:   156.6 kg (345 lb 3.9 oz)   SpO2: 96% 99% 96%    General: Alert, oriented x3, no distress Head: no evidence of trauma, PERRL, EOMI, no exophtalmos or lid lag, no myxedema, no xanthelasma; normal ears, nose and oropharynx Neck: minimally elevatedjugular venous pulsations (sitting 90 deg upright); brisk carotid pulses without delay and no carotid bruits Chest: clear to auscultation, no signs of consolidation by percussion or palpation, normal fremitus, symmetrical and full  respiratory excursions Cardiovascular: normal position and quality of the apical impulse, regular rhythm, normal first and second heart sounds, no rubs or gallops, 3/6 holosystolic murmur Abdomen: no tenderness or distention, no masses by palpation, no abnormal pulsatility or arterial bruits, normal bowel sounds, no hepatosplenomegaly Extremities: massive bilateral thigh and calf edema with weeping wounds, especially on R Neurological: grossly nonfocal  LABS  CBC  Recent Labs  09/14/14 0615  WBC 10.8*  HGB 8.5*  HCT 25.5*  MCV 92.7  PLT 081   Basic Metabolic Panel  Recent Labs  09/15/14 0530 09/16/14 0608  NA 132* 132*  K 3.4* 3.3*  CL 93* 92*  CO2 28 28  GLUCOSE 205* 224*  BUN 90* 88*  CREATININE 2.80* 2.52*  CALCIUM 8.4 8.0*   Liver Function Tests  Recent Labs  09/15/14 0530  AST  67*  ALT 39*  ALKPHOS 276*  BILITOT 0.9  PROT 6.0  ALBUMIN 1.4*    Radiology Studies Imaging results have been reviewed and No results found.  TELE atrial fibrillation, rate controlled  ASSESSMENT AND PLAN Add metolazone after her K is repleted. I'm not sure she needs prednisone anymore - her glycemic control is poor and it is impeding her diuresis also. Will stop it. Still has a long way to go with diuresis and then will need SNF - she wants [facility] or [place].   Sanda Klein, MD, [facility] [phone] office [phone] pager 09/16/2014 10:27 AM

## 2014-09-16 NOTE — Plan of Care (Signed)
Problem: Phase I Progression Outcomes Goal: Hemodynamically stable Vitals stable. Given pain medication as needed. Patient in great deal of pain when turning and repositioning. Given pain medication prior to activity. Patient states this helped

## 2014-09-17 DIAGNOSIS — I10 Essential (primary) hypertension: Secondary | ICD-10-CM

## 2014-09-17 LAB — BASIC METABOLIC PANEL
Anion gap: 13 (ref 5–15)
BUN: 85 mg/dL — ABNORMAL HIGH (ref 6–23)
CHLORIDE: 93 meq/L — AB (ref 96–112)
CO2: 29 mEq/L (ref 19–32)
Calcium: 8 mg/dL — ABNORMAL LOW (ref 8.4–10.5)
Creatinine, Ser: 2.34 mg/dL — ABNORMAL HIGH (ref 0.50–1.10)
GFR, EST AFRICAN AMERICAN: 24 mL/min — AB (ref >90)
GFR, EST NON AFRICAN AMERICAN: 21 mL/min — AB (ref >90)
Glucose, Bld: 103 mg/dL — ABNORMAL HIGH (ref 70–99)
POTASSIUM: 2.9 meq/L — AB (ref 3.7–5.3)
Sodium: 135 mEq/L — ABNORMAL LOW (ref 137–147)

## 2014-09-17 LAB — POTASSIUM: POTASSIUM: 3.6 meq/L — AB (ref 3.7–5.3)

## 2014-09-17 LAB — PROTIME-INR
INR: 3.02 — AB (ref 0.00–1.49)
PROTHROMBIN TIME: 31.3 s — AB (ref 11.6–15.2)

## 2014-09-17 LAB — GLUCOSE, CAPILLARY
GLUCOSE-CAPILLARY: 115 mg/dL — AB (ref 70–99)
Glucose-Capillary: 94 mg/dL (ref 70–99)
Glucose-Capillary: 161 mg/dL — ABNORMAL HIGH (ref 70–99)
Glucose-Capillary: 179 mg/dL — ABNORMAL HIGH (ref 70–99)

## 2014-09-17 LAB — MAGNESIUM: MAGNESIUM: 2 mg/dL (ref 1.5–2.5)

## 2014-09-17 MED ORDER — POTASSIUM CHLORIDE 10 MEQ/100ML IV SOLN
10.0000 meq | INTRAVENOUS | Status: AC
  Administered 2014-09-17 (×3): 10 meq via INTRAVENOUS
  Filled 2014-09-17 (×3): qty 100

## 2014-09-17 MED ORDER — GABAPENTIN 600 MG PO TABS
300.0000 mg | ORAL_TABLET | Freq: Two times a day (BID) | ORAL | Status: DC
  Filled 2014-09-17: qty 0.5

## 2014-09-17 MED ORDER — INSULIN GLARGINE 100 UNIT/ML ~~LOC~~ SOLN
20.0000 [IU] | Freq: Every day | SUBCUTANEOUS | Status: DC
  Administered 2014-09-17 – 2014-09-19 (×3): 20 [IU] via SUBCUTANEOUS
  Filled 2014-09-17 (×4): qty 0.2

## 2014-09-17 MED ORDER — HYDROCODONE-ACETAMINOPHEN 10-325 MG PO TABS
2.0000 | ORAL_TABLET | ORAL | Status: DC | PRN
  Administered 2014-09-17 – 2014-09-20 (×9): 2 via ORAL
  Filled 2014-09-17 (×9): qty 2

## 2014-09-17 MED ORDER — WARFARIN SODIUM 1 MG PO TABS
1.5000 mg | ORAL_TABLET | Freq: Once | ORAL | Status: AC
  Administered 2014-09-17: 1.5 mg via ORAL
  Filled 2014-09-17: qty 1

## 2014-09-17 MED ORDER — POTASSIUM CHLORIDE ER 10 MEQ PO TBCR
40.0000 meq | EXTENDED_RELEASE_TABLET | Freq: Two times a day (BID) | ORAL | Status: DC
  Administered 2014-09-17 – 2014-09-18 (×4): 40 meq via ORAL
  Filled 2014-09-17 (×6): qty 4

## 2014-09-17 MED ORDER — GABAPENTIN 300 MG PO CAPS
300.0000 mg | ORAL_CAPSULE | Freq: Two times a day (BID) | ORAL | Status: DC
  Administered 2014-09-17 – 2014-09-26 (×18): 300 mg via ORAL
  Filled 2014-09-17 (×19): qty 1

## 2014-09-17 NOTE — Progress Notes (Signed)
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 Still volume overleaded.   CURRENT MEDS . amiodarone  400 mg Oral BID  . calcitRIOL  0.25 mcg Oral BID  . calcium-vitamin D  1 tablet Oral BID  .  ceFAZolin (ANCEF) IV  2 g Intravenous Q12H  . collagenase   Topical Daily  . febuxostat  80 mg Oral Daily  . feeding supplement (PRO-STAT SUGAR FREE 64)  30 mL Oral BID WC  . furosemide  80 mg Intravenous BID  . glyBURIDE  5 mg Oral BID WC  . insulin aspart  0-15 Units Subcutaneous TID WC  . insulin aspart  0-5 Units Subcutaneous QHS  . insulin glargine  25 Units Subcutaneous QHS  . iron polysaccharides  150 mg Oral BID  . levothyroxine  125 mcg Oral QAC breakfast  . linagliptin  5 mg Oral Daily  . metolazone  2.5 mg Oral Daily  . multivitamin with minerals  1 tablet Oral Daily  . mupirocin cream   Topical Daily  . mupirocin ointment   Topical BID  . potassium chloride  10 mEq Intravenous Q1 Hr x 3  . saccharomyces boulardii  250 mg Oral BID  . sodium chloride  3 mL Intravenous Q12H  . Warfarin - Pharmacist Dosing Inpatient   Does not apply q1800    OBJECTIVE  Filed Vitals:   09/16/14 0736 09/16/14 1500 09/16/14 2038 09/17/14 0431  BP: 104/54 97/60 126/59 101/49  Pulse: 81 90 85  84  Temp:  97.7 F (36.5 C) 97.7 F (36.5 C) 97.9 F (36.6 C)  TempSrc:  Oral Oral Oral  Resp:  18 18 19   Height:      Weight:    344 lb 12.8 oz (156.4 kg)  SpO2:  96% 99% 96%    Intake/Output Summary (Last 24 hours) at 09/17/14 1026 Last data filed at 09/17/14 0815  Gross per 24 hour  Intake    380 ml  Output   2650 ml  Net  -2270 ml   Filed Weights   09/15/14 0651 09/16/14 0449 09/17/14 0431  Weight: 342 lb 8 oz (155.357 kg) 345 lb 3.9 oz (156.6 kg) 344 lb 12.8 oz (156.4 kg)    PHYSICAL EXAM  General: Pleasant, NAD. Neuro: Alert and oriented X 3. Moves all extremities spontaneously. Psych: Normal affect. HEENT:  Normal  Neck: Supple without bruits or JVD. Lungs:  Resp regular and unlabored, CTA. Heart: RRR no s3, s4, or murmurs. Abdomen: Soft, non-tender, non-distended, BS + x 4.  Extremities: No clubbing, cyanosis or edema. DP/PT/Radials 2+ and equal bilaterally.  Accessory Clinical Findings  CBC  Basic Metabolic Panel  Recent Labs  09/16/14 0608 09/17/14 0518  NA  132* 135*  K 3.3* 2.9*  CL 92* 93*  CO2 28 29  GLUCOSE 224* 103*  BUN 88* 85*  CREATININE 2.52* 2.34*  CALCIUM 8.0* 8.0*  MG  --  2.0   Liver Function Tests  Recent Labs  09/15/14 0530  AST 67*  ALT 39*  ALKPHOS 276*  BILITOT 0.9  PROT 6.0  ALBUMIN 1.4*    TELE  Atrial fib with CVR  Radiology/Studies  Dg Chest Port 1 View  09/14/2014   CLINICAL DATA:  Airspace disease.  EXAM: PORTABLE CHEST - 1 VIEW  COMPARISON:  09/13/2014.  FINDINGS: Support apparatus: LEFT IJ central line unchanged with the tip in the upper to mid SVC. Pacemaker leads appear unchanged.  Cardiomediastinal Silhouette:  Enlarged, unchanged.  Lungs: Mild improvement in pulmonary edema. Pulmonary vascular congestion persists. Subsegmental atelectasis the LEFT mid lung. No focal consolidation. No pneumothorax.  Effusions:  None.  Other:  None.  IMPRESSION: 1. Stable support apparatus. 2. Unchanged cardiomegaly. 3.  Improved central pulmonary edema with persistent pulmonary vascular congestion.   Electronically Signed   By: Dereck Ligas M.D.   On: 09/14/2014 08:10   Dg Chest Port 1 View  09/13/2014   CLINICAL DATA:  Assess edema  EXAM: PORTABLE CHEST - 1 VIEW  COMPARISON:  September 12, 2014  FINDINGS: The heart size and mediastinal contours are stable. The heart size is enlarged. Left central venous line is identified with distal tip in the superior vena cava. Cardiac pacemaker is unchanged. There is enlargement of central pulmonary vessels worse compared to prior exam. There is no focal pneumonia or pleural effusion. The visualized skeletal structures are stable.  IMPRESSION: Slight interval worsened central pulmonary vascular congestion. There is no focal pneumonia or pleural effusion. Cardiomegaly.   Electronically Signed   By: Abelardo Diesel M.D.   On: 09/13/2014 07:31   Dg Chest Port 1 View  09/12/2014   CLINICAL DATA:  Evaluate pulmonary edema.  EXAM: PORTABLE CHEST - 1 VIEW  COMPARISON:  Chest x-ray 09/10/2014.  FINDINGS: There is a left-sided internal jugular central venous catheter with tip terminating in the mid superior vena cava. Right-sided pacemaker device in position with lead tips projecting over the expected location of the right atrium and right ventricular apex. Lung volumes are normal. No consolidative airspace disease. No pleural effusions. Cephalization of the pulmonary vasculature, without frank pulmonary edema. Moderate cardiomegaly. The patient is rotated to the right on today's exam, resulting in distortion of the mediastinal contours and reduced diagnostic sensitivity and specificity for mediastinal pathology. Atherosclerosis in the thoracic aorta.  IMPRESSION: 1. Support apparatus, as above. 2. Cardiomegaly with pulmonary venous congestion, but no frank pulmonary edema. 3. Atherosclerosis.   Electronically Signed   By: Vinnie Langton M.D.   On: 09/12/2014 08:25   Dg Chest Port 1  View  09/10/2014   CLINICAL DATA:  Line placement  EXAM: PORTABLE CHEST - 1 VIEW  COMPARISON:  Portable exam 8295 hr compared to 06/03/2013  FINDINGS: RIGHT subclavian pacemaker leads project over RIGHT atrium and RIGHT ventricle, unchanged.  New LEFT subclavian central venous catheter with tip projecting over mid to distal SVC.  Enlargement of cardiac silhouette with slight pulmonary vascular congestion.  No gross infiltrate, pleural effusion or pneumothorax.  Bones unremarkable.  IMPRESSION: No pneumothorax following central line placement.  Enlargement of cardiac silhouette with pulmonary vascular congestion.   Electronically Signed   By: Lavonia Dana M.D.   On: 09/10/2014 17:05   Dg Tibia/fibula Left  Port  09/10/2014   CLINICAL DATA:  Edema and cellulitis  EXAM: PORTABLE LEFT TIBIA AND FIBULA - 2 VIEW  COMPARISON:  None.  FINDINGS: Frontal and lateral views were obtained. There is marked soft tissue swelling. No fracture or dislocation. No erosive change or bony destruction. There is no evidence of soft tissue air.  IMPRESSION: Diffuse soft tissue edema/swelling. No soft tissue air is seen. No erosive change or bony destruction. No fracture or dislocation.  If there remains concern for potential soft tissue abscess for subtle air, CT would be the imaging study of choice to further assess.   Electronically Signed   By: Lowella Grip M.D.   On: 09/10/2014 17:07    ASSESSMENT AND PLAN  64 y/o female with multiple medical problems including NICM (EF 30-35%) with severe MR/TR, mod-sev pulmonary hypertension, PAF-recurrent despite increasing doses of Amiodarone and two DCCVs so far this year, s/p PPM placement, morbid obesity, massive chronic LE edema, DM and CKD with a solitary kidney who was admitted 09/09/14 and treated for sepsis in the setting of severe cellulitis.  Voulme-overload:  Weight recordings all over the board. Highest recorded weight 379 on 09/11/14. Now 344 lbs today, but still thought to  be up 44 lbs over dry weight. She was 304 lbs on 09/09/14, but question accuracy. -- Net neg 6.9L. Continue diuresis with IV Lasix 80 BID -- Continue strict I/Os, daily weights and low sodium diet.  -- Continue to monitor BP, renal function and electrolytes.  -- Plan to add metolazone after her K is repleted. K is 2.9 today. Being repleted.  -- Prednisone discontinued yesterday as her glycemic control is poor and it is impeding her diuresis also.  -- Still has a long way to go with diuresis and then will need SNF - she wants [facility] or [facility].  Atrial Fibrillation: rate is controlled in the 80s. Asymptomatic. Continue Amidarone and Warfarin. INR is therapeutic at 3.   Hypokalemia: K is 3.3 today. Will replete with supplemental K. Monitor closely in the setting of aggressive diuretic therapy.   Cellulitis of Lt lower leg: management per TRH.   CKD: SCr is improved from 2.8 to 2.5 to 2.34 today. Continue to monitor closely in the setting of diuretic therapy.   Leg cramps- treat pain.   Mable Fill R PA-C  Pager [phone]  I have seen and examined the patient along with Angelena Form R PA-C.  I have reviewed the chart, notes and new data.  I agree with PA's note.  Severe hypokalemia. Will DC metolazone and supplement K.   Sanda Klein, MD, [facility] [phone] 09/17/2014, 11:48 AM

## 2014-09-17 NOTE — Progress Notes (Signed)
OT Cancellation Note  Patient Details Name: [name] MRN: [id number] DOB: [birth year]   Cancelled Treatment:    Reason Eval/Treat Not Completed: Pain limiting ability to participate. Pt declining OT due to c/o B LE pain and edema and falling with OT last tx session. Pt's sister adamantly declining any therapy today due to B LE edema  Britt Bottom 09/17/2014, 12:33 PM

## 2014-09-17 NOTE — Progress Notes (Signed)
Pt stated she did not wear CPAP at night and was not interested in wearing tonight. She wears a nasal cannula while she sleeps.

## 2014-09-17 NOTE — Progress Notes (Signed)
Pt notified RN of right hand swelling; notified MD; MD will come take a look.  Rowe Pavy, RN

## 2014-09-17 NOTE — Progress Notes (Signed)
Critical Potassium value 2.9. MD made aware. Orders placed. Will continue to monitor.  Domingo Dimes RN

## 2014-09-17 NOTE — Progress Notes (Signed)
PROGRESS NOTE  [name] KXF:[id number] DOB: 08/14/1950 DOA: 09/09/2014 PCP: Elby Showers, MD  64 y.o. female with history of fibromyalgia, hypothyroidism, solitary kidney, hypothyroidism, diabetes, nonischemic cardiomyopathy with ejection fraction of 35%, atrial fibrillation who was admitted on 9/10 w/ recurrent Lower extremity cellulitis. PCCM asked to see in consult on 9/11 for persistent hypotension and worsening renal failure. Was in ICU on pressors until 9/14  Assessment/Plan: Septic shock due to Left lower leg cellulitis:  - abx cefazolin from 9/14  - Keep lower extremity elevated.  - blood culture X 2 - 1/2 group B strep - Wound care consult  - pt/ot  - steroids stopped.   . Type 2 diabetes mellitus:  - A1c 7.0  - lantus increased to 25 units.  - SSI while inpatient.  CBG (last 3)   Recent Labs  09/17/14 0620 09/17/14 1116 09/17/14 1622  GLUCAP 94 115* 161*      . PAF (paroxysmal atrial fibrillation)  - Currently with a paced rhythm, monitor in telemetry.  - Coumadin to be dosed by pharmacy.  - Continue home medications  . Combined systolic and diastolic congestive heart failure- EF 30-35% June 2014   . Hypothyroidism, acquired, autoimmune: Recent TSH was 2.12 on 05/28/14.  - Continue levothyroxine   Gout: stable  -Continue home medication   . CKD (chronic kidney disease) stage 4, GFR 15-29 ml/min: Cre is slightly higher from baseline 2.5-3.00 to 3.38 on admission  - Monitor closely     . PACEMAKER, PERMANENT  - Monitor in telemetry.   . Obesity hypoventilation syndrome  - CPAP each bedtime   . OBESITY, MORBID  Hypokalemia:  Replete as needed.    Code Status: full Family Communication: patient Disposition Plan: SNF   Consultants:  PCCM  cards  Procedures:  none    HPI/Subjective: Fasting cbg is 95. She reports worsening pain and spasms in her lower extremities.  Objective: Filed Vitals:   09/17/14 0431  BP: 101/49    Pulse: 84  Temp: 97.9 F (36.6 C)  Resp: 19    Intake/Output Summary (Last 24 hours) at 09/17/14 1629 Last data filed at 09/17/14 1148  Gross per 24 hour  Intake    263 ml  Output   1450 ml  Net  -1187 ml   Filed Weights   09/15/14 0651 09/16/14 0449 09/17/14 0431  Weight: 155.357 kg (342 lb 8 oz) 156.6 kg (345 lb 3.9 oz) 156.4 kg (344 lb 12.8 oz)    Exam:   General:  Pleasant/A+Ox3  Cardiovascular: irr  Respiratory: clear, no wheezing  Abdomen: obese  Musculoskeletal: +Edema, less redness on left leg   Data Reviewed: Basic Metabolic Panel:  Recent Labs Lab 09/13/14 0342 09/14/14 0615 09/15/14 0530 09/16/14 0608 09/17/14 0518  NA 133* 137 132* 132* 135*  K 4.0 3.7 3.4* 3.3* 2.9*  CL 95* 97 93* 92* 93*  CO2 27 28 28 28 29   GLUCOSE 168* 225* 205* 224* 103*  BUN 82* 85* 90* 88* 85*  CREATININE 3.18* 2.83* 2.80* 2.52* 2.34*  CALCIUM 8.4 8.4 8.4 8.0* 8.0*  MG  --   --   --   --  2.0   Liver Function Tests:  Recent Labs Lab 09/10/14 1715 09/12/14 0530 09/15/14 0530  AST 57* 55* 67*  ALT 49* 43* 39*  ALKPHOS 164* 192* 276*  BILITOT 1.6* 1.8* 0.9  PROT 6.2 5.9* 6.0  ALBUMIN 1.6* 1.4* 1.4*   No results found for this basename: LIPASE, AMYLASE,  in the last 168 hours No results found for this basename: AMMONIA,  in the last 168 hours CBC:  Recent Labs Lab 09/11/14 0355 09/12/14 0530 09/13/14 0342 09/14/14 0615  WBC 34.1* 24.9* 15.2* 10.8*  NEUTROABS  --  23.8* 14.6*  --   HGB 9.7* 9.1* 8.7* 8.5*  HCT 30.4* 28.0* 26.3* 25.5*  MCV 99.0 96.9 93.6 92.7  PLT 371 281 226 220   Cardiac Enzymes: No results found for this basename: CKTOTAL, CKMB, CKMBINDEX, TROPONINI,  in the last 168 hours BNP (last 3 results) No results found for this basename: PROBNP,  in the last 8760 hours CBG:  Recent Labs Lab 09/16/14 1122 09/16/14 1602 09/16/14 2102 09/17/14 0620 09/17/14 1116  GLUCAP 221* 230* 215* 94 115*    Recent Results (from the past 240  hour(s))  CULTURE, BLOOD (ROUTINE X 2)     Status: None   Collection Time    09/09/14  6:20 PM      Result Value Ref Range Status   Specimen Description BLOOD RIGHT ARM   Final   Special Requests BOTTLES DRAWN AEROBIC AND ANAEROBIC 10CC   Final   Culture  Setup Time     Final   Value: 09/09/2014 22:28     Performed at [facility]   Culture     Final   Value: GROUP B STREP(S.AGALACTIAE)ISOLATED     Note: Gram Stain Report Called to,Read Back By and Verified With: ALICIA SPERRY ON 4/35/6861 AT 9:04P BY WILEJ     Performed at [facility]   Report Status 09/12/2014 FINAL   Final   Organism ID, Bacteria GROUP B STREP(S.AGALACTIAE)ISOLATED   Final  CULTURE, BLOOD (ROUTINE X 2)     Status: None   Collection Time    09/09/14  6:35 PM      Result Value Ref Range Status   Specimen Description BLOOD ARM RIGHT   Final   Special Requests BOTTLES DRAWN AEROBIC AND ANAEROBIC 0.2CC   Final   Culture  Setup Time     Final   Value: 09/10/2014 01:00     Performed at [facility]   Culture     Final   Value: NO GROWTH 5 DAYS     Performed at [facility]   Report Status 09/16/2014 FINAL   Final  MRSA PCR SCREENING     Status: None   Collection Time    09/10/14  4:02 PM      Result Value Ref Range Status   MRSA by PCR NEGATIVE  NEGATIVE Final   Comment:            The GeneXpert MRSA Assay (FDA     approved for NASAL specimens     only), is one component of a     comprehensive MRSA colonization     surveillance program. It is not     intended to diagnose MRSA     infection nor to guide or     monitor treatment for     MRSA infections.  URINE CULTURE     Status: None   Collection Time    09/10/14  4:02 PM      Result Value Ref Range Status   Specimen Description URINE, CATHETERIZED   Final   Special Requests Normal   Final   Culture  Setup Time     Final   Value: 09/10/2014 17:04     Performed at [facility]  Count     Final   Value:  85,000 COLONIES/ML     Performed at [facility]   Culture     Final   Value: KLEBSIELLA PNEUMONIAE     Performed at [facility]   Report Status 09/15/2014 FINAL   Final   Organism ID, Bacteria KLEBSIELLA PNEUMONIAE   Final  CULTURE, BLOOD (ROUTINE X 2)     Status: None   Collection Time    09/15/14  1:21 PM      Result Value Ref Range Status   Specimen Description BLOOD RIGHT ANTECUBITAL   Final   Special Requests BOTTLES DRAWN AEROBIC ONLY 5CC   Final   Culture  Setup Time     Final   Value: 09/15/2014 18:00     Performed at [facility]   Culture     Final   Value:        BLOOD CULTURE RECEIVED NO GROWTH TO DATE CULTURE WILL BE HELD FOR 5 DAYS BEFORE ISSUING A FINAL NEGATIVE REPORT     Performed at [facility]   Report Status PENDING   Incomplete  CULTURE, BLOOD (ROUTINE X 2)     Status: None   Collection Time    09/15/14  1:35 PM      Result Value Ref Range Status   Specimen Description BLOOD LEFT ANTECUBITAL   Final   Special Requests BOTTLES DRAWN AEROBIC AND ANAEROBIC 10CC   Final   Culture  Setup Time     Final   Value: 09/15/2014 18:02     Performed at [facility]   Culture     Final   Value:        BLOOD CULTURE RECEIVED NO GROWTH TO DATE CULTURE WILL BE HELD FOR 5 DAYS BEFORE ISSUING A FINAL NEGATIVE REPORT     Performed at [facility]   Report Status PENDING   Incomplete     Studies: No results found.  Scheduled Meds: . amiodarone  400 mg Oral BID  . calcitRIOL  0.25 mcg Oral BID  . calcium-vitamin D  1 tablet Oral BID  .  ceFAZolin (ANCEF) IV  2 g Intravenous Q12H  . collagenase   Topical Daily  . febuxostat  80 mg Oral Daily  . feeding supplement (PRO-STAT SUGAR FREE 64)  30 mL Oral BID WC  . furosemide  80 mg Intravenous BID  . glyBURIDE  5 mg Oral BID WC  . insulin aspart  0-15 Units Subcutaneous TID WC  . insulin aspart  0-5 Units Subcutaneous QHS  . insulin glargine  25 Units Subcutaneous QHS  .  iron polysaccharides  150 mg Oral BID  . levothyroxine  125 mcg Oral QAC breakfast  . linagliptin  5 mg Oral Daily  . multivitamin with minerals  1 tablet Oral Daily  . mupirocin cream   Topical Daily  . mupirocin ointment   Topical BID  . potassium chloride  40 mEq Oral BID  . saccharomyces boulardii  250 mg Oral BID  . sodium chloride  3 mL Intravenous Q12H  . warfarin  1.5 mg Oral ONCE-1800  . Warfarin - Pharmacist Dosing Inpatient   Does not apply q1800   Continuous Infusions:  Antibiotics Given (last 72 hours)   Date/Time Action Medication Dose Rate   09/15/14 0115 Given   ceFAZolin (ANCEF) IVPB 2 g/50 mL premix 2 g 100 mL/hr   09/15/14 1113 Given   ceFAZolin (ANCEF) IVPB 2  g/50 mL premix 2 g 100 mL/hr   09/16/14 0023 Given   ceFAZolin (ANCEF) IVPB 2 g/50 mL premix 2 g 100 mL/hr   09/16/14 1109 Given   ceFAZolin (ANCEF) IVPB 2 g/50 mL premix 2 g 100 mL/hr   09/17/14 0100 Given   ceFAZolin (ANCEF) IVPB 2 g/50 mL premix 2 g 100 mL/hr   09/17/14 1305 Given   ceFAZolin (ANCEF) IVPB 2 g/50 mL premix 2 g 100 mL/hr      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   Obesity hypoventilation syndrome-    Cardiomyopathy, nonischemic- EF 30-35% June 2014   Type 2 diabetes mellitus   Chronic combined systolic and diastolic CHF   Long term (current) use of anticoagulants   CKD (chronic kidney disease) stage 4, GFR 15-29 ml/min   PAF- recurrent despite Amiodarone and multiple cardioversions   Mitral insufficiency, moderate to severe   Chronic massive bilat LE lymphadema   Fall at home- 3 falls this summer, sound orthostatic   Hypotension   Acute on chronic renal failure    Time spent: 35 min    Judythe Postema  [facility] Pager [phone] 7PM-7AM, please contact night-coverage at [email or web address], password [facility] 09/17/2014, 4:29 PM  LOS: 8 days

## 2014-09-17 NOTE — Progress Notes (Signed)
PT Cancellation Note  Patient Details Name: [name] MRN: [id number] DOB: 1950-11-11   Cancelled Treatment:    Reason Eval/Treat Not Completed: Pain limiting ability to participate. Pt reports cramping in legs and numb feet.   Avari Gelles 09/17/2014, 2:08 PM  [facility] PT [phone]

## 2014-09-17 NOTE — Progress Notes (Signed)
ANTICOAGULATION CONSULT NOTE - Follow Up Consult  Pharmacy Consult:  Coumadin Indication: atrial fibrillation  Allergies  Allergen Reactions  . Ivp Dye [Iodinated Diagnostic Agents] Shortness Of Breath    Turn red, can't breathe  . Betadine [Povidone Iodine]   . Diphenhydramine Hcl Swelling    In hands and eyes  . Fish Allergy Nausea And Vomiting  . Iohexol      Desc: PT TURNS RED AND WHEEZING   . Penicillins Other (See Comments)    Resp arrest as child. Tolerates cephalosporins.  . Povidone-Iodine Other (See Comments)    Wheezing, turn red, can't breath, and blisters  . Promethazine Hcl Other (See Comments)    Low Blood Pressure  . Tape     Blisters, can use paper tape for short periods  . Clindamycin Hives and Rash    wheezing  . Iodine Rash  . Morphine Sulfate Nausea And Vomiting and Rash  . Primaxin [Imipenem] Rash    Patient Measurements: Height: 5' 6.93" (170 cm) Weight: 344 lb 12.8 oz (156.4 kg) IBW/kg (Calculated) : 61.44  Vital Signs: Temp: 97.9 F (36.6 C) (09/18 0431) Temp src: Oral (09/18 0431) BP: 101/49 mmHg (09/18 0431) Pulse Rate: 84 (09/18 0431)  Labs:  Recent Labs  09/15/14 0530 09/16/14 0608 09/17/14 0518  LABPROT 25.1* 28.1* 31.3*  INR 2.28* 2.63* 3.02*  CREATININE 2.80* 2.52* 2.34*    Estimated Creatinine Clearance: 37.1 ml/min (by C-G formula based on Cr of 2.34).  Assessment: 3 YOF continues on Coumadin from PTA for history of Afib.  INR supra-therapeutic (3.4) on admit, then peaked at 5 likely due to drug-drug interactions with Cipro and Flagyl.  She then received Vitamin K 1mg  IV x 1 on 09/12/14.    INR is just above goal today and appears to be trending up so will reduce dose today; no bleeding reported.  Noted patient fell 9/15 but did not hit her head per documentation.  Goal of Therapy:  INR 2-3   Plan:  - Coumadin 1.5 mg PO today - Daily PT / INR - Monitor for s/sx of bleeding  [facility], Pharm.D.,  BCPS Clinical Pharmacist Pager: [phone] 09/17/2014 11:00 AM

## 2014-09-18 DIAGNOSIS — E876 Hypokalemia: Secondary | ICD-10-CM

## 2014-09-18 DIAGNOSIS — L97909 Non-pressure chronic ulcer of unspecified part of unspecified lower leg with unspecified severity: Secondary | ICD-10-CM

## 2014-09-18 DIAGNOSIS — Z7901 Long term (current) use of anticoagulants: Secondary | ICD-10-CM

## 2014-09-18 LAB — BASIC METABOLIC PANEL
ANION GAP: 12 (ref 5–15)
BUN: 76 mg/dL — ABNORMAL HIGH (ref 6–23)
CALCIUM: 7.9 mg/dL — AB (ref 8.4–10.5)
CO2: 29 mEq/L (ref 19–32)
CREATININE: 2.14 mg/dL — AB (ref 0.50–1.10)
Chloride: 94 mEq/L — ABNORMAL LOW (ref 96–112)
GFR calc Af Amer: 27 mL/min — ABNORMAL LOW (ref >90)
GFR calc non Af Amer: 23 mL/min — ABNORMAL LOW (ref >90)
Glucose, Bld: 117 mg/dL — ABNORMAL HIGH (ref 70–99)
Potassium: 3.5 mEq/L — ABNORMAL LOW (ref 3.7–5.3)
Sodium: 135 mEq/L — ABNORMAL LOW (ref 137–147)

## 2014-09-18 LAB — GLUCOSE, CAPILLARY
GLUCOSE-CAPILLARY: 198 mg/dL — AB (ref 70–99)
Glucose-Capillary: 115 mg/dL — ABNORMAL HIGH (ref 70–99)
Glucose-Capillary: 118 mg/dL — ABNORMAL HIGH (ref 70–99)
Glucose-Capillary: 221 mg/dL — ABNORMAL HIGH (ref 70–99)

## 2014-09-18 LAB — PROTIME-INR
INR: 3.11 — AB (ref 0.00–1.49)
Prothrombin Time: 32 seconds — ABNORMAL HIGH (ref 11.6–15.2)

## 2014-09-18 MED ORDER — ALPRAZOLAM 0.25 MG PO TABS
0.2500 mg | ORAL_TABLET | Freq: Two times a day (BID) | ORAL | Status: DC | PRN
  Administered 2014-09-20 – 2014-10-22 (×15): 0.25 mg via ORAL
  Filled 2014-09-18 (×15): qty 1

## 2014-09-18 MED ORDER — WARFARIN SODIUM 1 MG PO TABS
1.5000 mg | ORAL_TABLET | Freq: Once | ORAL | Status: AC
  Administered 2014-09-18: 1.5 mg via ORAL
  Filled 2014-09-18: qty 1

## 2014-09-18 NOTE — Progress Notes (Signed)
ANTICOAGULATION CONSULT NOTE - Follow Up Consult  Pharmacy Consult:  Coumadin Indication: atrial fibrillation  Allergies  Allergen Reactions  . Ivp Dye [Iodinated Diagnostic Agents] Shortness Of Breath    Turn red, can't breathe  . Betadine [Povidone Iodine]   . Diphenhydramine Hcl Swelling    In hands and eyes  . Fish Allergy Nausea And Vomiting  . Iohexol      Desc: PT TURNS RED AND WHEEZING   . Penicillins Other (See Comments)    Resp arrest as child. Tolerates cephalosporins.  . Povidone-Iodine Other (See Comments)    Wheezing, turn red, can't breath, and blisters  . Promethazine Hcl Other (See Comments)    Low Blood Pressure  . Tape     Blisters, can use paper tape for short periods  . Clindamycin Hives and Rash    wheezing  . Iodine Rash  . Morphine Sulfate Nausea And Vomiting and Rash  . Primaxin [Imipenem] Rash    Patient Measurements: Height: 5' 6.93" (170 cm) Weight: 347 lb 1.6 oz (157.444 kg) IBW/kg (Calculated) : 61.44  Vital Signs: Temp: 97.4 F (36.3 C) (09/19 0544) Temp src: Oral (09/19 0544) BP: 87/35 mmHg (09/19 1048) Pulse Rate: 87 (09/19 1048)  Labs:  Recent Labs  09/16/14 0608 09/17/14 0518 09/18/14 0545  LABPROT 28.1* 31.3* 32.0*  INR 2.63* 3.02* 3.11*  CREATININE 2.52* 2.34* 2.14*    Estimated Creatinine Clearance: 40.7 ml/min (by C-G formula based on Cr of 2.14).  Assessment: 47 YOF continues on Coumadin from PTA for history of Afib.  INR supra-therapeutic (3.4) on admit, then peaked at 5 likely due to drug-drug interactions with Cipro and Flagyl.  She then received Vitamin K 1mg  IV x 1 on 09/12/14.    INR is just above goal today and appears trend up has slowed down; no bleeding reported. Eating 75-100% of meals. Noted patient fell 9/15 but did not hit her head per documentation.  Goal of Therapy:  INR 2-3   Plan:  - Coumadin 1.5 mg PO today - Daily PT / INR - Monitor for s/sx of bleeding  [facility], Pharm.D.,  BCPS Clinical Pharmacist Pager: [phone] 09/18/2014 11:17 AM

## 2014-09-18 NOTE — Progress Notes (Signed)
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   Obesity hypoventilation syndrome-    Cardiomyopathy, nonischemic- EF 30-35% June 2014   Type 2 diabetes mellitus   Chronic combined systolic and diastolic CHF   Long term (current) use of anticoagulants   CKD (chronic kidney disease) stage 4, GFR 15-29 ml/min   PAF- recurrent despite Amiodarone and multiple cardioversions   Mitral insufficiency, moderate to severe   Chronic massive bilat LE lymphadema   Fall at home- 3 falls this summer, sound orthostatic   Hypotension   Acute on chronic renal failure   Length of Stay: 9  SUBJECTIVE  Weight not changing much , despite negative in/out every day. Diuresis slowed after stopping metolazone for severe hypokalemia. BMET not back today, K was back to 3.6 last PM Shooting for a weight around 320 lb  CURRENT MEDS . amiodarone  400 mg Oral BID  . calcitRIOL  0.25 mcg Oral BID  . calcium-vitamin D  1 tablet Oral BID  .  ceFAZolin (ANCEF) IV  2 g Intravenous Q12H  . collagenase   Topical Daily  . febuxostat  80 mg Oral Daily  . feeding supplement (PRO-STAT SUGAR FREE 64)  30 mL Oral BID WC  . furosemide  80 mg Intravenous BID  . gabapentin  300 mg Oral BID  . glyBURIDE  5 mg Oral BID WC  . insulin aspart  0-15 Units Subcutaneous TID WC  . insulin aspart  0-5 Units Subcutaneous QHS  . insulin glargine  20 Units Subcutaneous QHS  . iron polysaccharides  150 mg Oral BID  . levothyroxine  125 mcg Oral QAC breakfast  . linagliptin  5 mg Oral Daily  . multivitamin with minerals  1 tablet Oral Daily  . mupirocin cream   Topical Daily  . mupirocin ointment   Topical BID  . potassium chloride  40 mEq Oral BID  . saccharomyces boulardii  250 mg Oral BID  . sodium chloride  3 mL Intravenous Q12H  . Warfarin - Pharmacist Dosing Inpatient    Does not apply q1800    OBJECTIVE   Intake/Output Summary (Last 24 hours) at 09/18/14 0753 Last data filed at 09/18/14 0545  Gross per 24 hour  Intake    263 ml  Output   1476 ml  Net  -1213 ml   Filed Weights   09/16/14 0449 09/17/14 0431 09/18/14 0544  Weight: 156.6 kg (345 lb 3.9 oz) 156.4 kg (344 lb 12.8 oz) 157.444 kg (347 lb 1.6 oz)    PHYSICAL EXAM Filed Vitals:   09/16/14 2038 09/17/14 0431 09/17/14 2055 09/18/14 0544  BP: 126/59 101/49 117/59 97/57  Pulse: 85 84 84 85  Temp: 97.7 F (36.5 C) 97.9 F (36.6 C) 97.8 F (36.6 C) 97.4 F (36.3 C)  TempSrc: Oral Oral Oral Oral  Resp: 18 19 18 18   Height:      Weight:  156.4 kg (344 lb 12.8 oz)  157.444 kg (347 lb 1.6 oz)  SpO2: 99% 96% 98% 100%   General: Alert, oriented x3, no distress  Head: no evidence of trauma, PERRL, EOMI, no exophtalmos or lid lag, no myxedema, no xanthelasma; normal ears, nose and oropharynx  Neck: minimally elevatedjugular venous pulsations (sitting 90 deg upright); brisk carotid pulses without delay and no carotid bruits  Chest: clear to auscultation, no signs of consolidation by percussion or palpation, normal fremitus, symmetrical and full respiratory excursions  Cardiovascular: normal position and quality of the apical impulse, regular rhythm, normal first and second heart sounds, no rubs or gallops, 3/6 holosystolic murmur  Abdomen: no tenderness or distention, no masses by palpation, no abnormal pulsatility or arterial bruits, normal bowel sounds, no hepatosplenomegaly  Extremities: massive bilateral thigh and calf edema with weeping wounds, especially on R  Neurological: grossly nonfocal   LABS  CBC No results found for this basename: WBC, NEUTROABS, HGB, HCT, MCV, PLT,  in the last 72 hours Basic Metabolic Panel  Recent Labs  09/16/14 0608 09/17/14 0518 09/17/14 1530  NA 132* 135*  --   K 3.3* 2.9* 3.6*  CL 92* 93*  --   CO2 28 29  --   GLUCOSE 224* 103*  --   BUN 88*  85*  --   CREATININE 2.52* 2.34*  --   CALCIUM 8.0* 8.0*  --   MG  --  2.0  --    Radiology Studies Imaging results have been reviewed and No results found.  TELE AFib with satisfactory rate control  ASSESSMENT AND PLAN  Slow progress with diuresis, especially after DC metolazone for hypokalemia. Labs still pending for today. Consider lasix drip.   Sanda Klein, MD, [facility] [phone] office [phone] pager 09/18/2014 7:53 AM

## 2014-09-18 NOTE — Patient Instructions (Signed)
Home health orders to be signed. Influenza immunization given.

## 2014-09-18 NOTE — Progress Notes (Signed)
Pt. Continues to refuse CPAP stating that she will wear her nasal cannula tonight while she sleeps. Pt. Is aware to let RT or RN anytime during the night if she changes her mind & decides to wear CPAP.

## 2014-09-18 NOTE — Progress Notes (Signed)
   Subjective:    Patient ID: [name], female    DOB: 23-Apr-1950, 64 y.o.   MRN: [id number]  HPI  Patient has not been seen here since May 2013. She has a history of many complex medical problems. She's followed by multiple providers. She has a history of diabetes mellitus, monoclonal gammopathy, hypertension, sleep apnea, fibromyalgia, anxiety depression, hypothyroidism, morbid obesity, paroxysmal atrial tachycardia with pacemaker for sick sinus syndrome. History of recurrent ulcers of leg that quickly turned to cellulitis. She has a history of chronic kidney disease.  History of moderately severe nonischemic cardiomyopathy with an ejection fraction of 30-35% with severe mitral insufficiency and severe tricuspid insufficiency and moderate to severe pulmonary hypertension. Not felt to be candidate for valve surgery based on multiple comorbidities. Has recurrent PAF despite increasing doses of amiodarone into cardioversions. Chronic lower extremity edema.  We received a phone call recently that she was being discharged from [facility] he needed home physical therapy and home health orders signed. We asked her to come to the office since we had not seen her in 2 years.    Review of Systems     Objective:   Physical Exam  She has healing ulcers on her legs. No evidence of cellulitis. Chest clear. Cardiac exam regular rate and rhythm. Significant lower extremity edema which has been present for years  She is cheerful today. She actually looks quite good.    Assessment & Plan:  Morbid obesity  History of paroxysmal atrial tachycardia  Nonischemic cardiomyopathy  Diabetes mellitus  History of cellulitis of lower extremities  Ulcers of lower extremities  Dependent edema  Monoclonal gammopathy  Chronic kidney disease  Hypothyroidism  History of pacemaker for sick sinus syndrome  Low ejection fraction  Moderate to severe pulmonary hypertension  Severe  mitral insufficiency  Plan: Home health orders will be signed. Patient is planned to be back home and out of nursing home.  25 minutes spent with patient.

## 2014-09-18 NOTE — Progress Notes (Signed)
PROGRESS NOTE  [name] OYD:[id number] DOB: 02/23/1950 DOA: 09/09/2014 PCP: Elby Showers, MD  64 y.o. female with history of fibromyalgia, hypothyroidism, solitary kidney, hypothyroidism, diabetes, nonischemic cardiomyopathy with ejection fraction of 35%, atrial fibrillation who was admitted on 9/10 w/ recurrent Lower extremity cellulitis. PCCM asked to see in consult on 9/11 for persistent hypotension and worsening renal failure. Was in ICU on pressors until 9/14. Transferred to telemetry on 9/15.  Patient is on cefazolin from 9/14. She has completed 5 days of IV cefazolin.   Assessment/Plan: Septic shock due to Left lower leg cellulitis:  - abx cefazolin from 9/14 , completed 5 days of IV cefazolin, in addition to IV vancomycin for 4 days , and ciprofloxacin and flagyl for 3 days.   - Keep lower extremity elevated.  - blood culture X 2 - 1/2 group B strep. We will complete a course of 14 days of antibiotics.  - Wound care consulted again  - pt/ot  - steroids stopped.   . Type 2 diabetes mellitus:  - A1c 7.0  - lantus increased to 25 units.  - SSI while inpatient.  CBG (last 3)   Recent Labs  09/18/14 0637 09/18/14 1125 09/18/14 1622  GLUCAP 115* 118* 221*      . PAF (paroxysmal atrial fibrillation)  - Currently with a paced rhythm, monitor in telemetry.  - Coumadin to be dosed by pharmacy.  - Continue home medications  . Combined systolic and diastolic congestive heart failure- EF 30-35% June 2014   . Hypothyroidism, acquired, autoimmune: Recent TSH was 2.12 on 05/28/14.  - Continue levothyroxine   Gout: stable  -Continue home medication   . CKD (chronic kidney disease) stage 4, GFR 15-29 ml/min: Cre is slightly higher from baseline 2.5-3.00 to 3.38 on admission  - Monitor closely     . PACEMAKER, PERMANENT  - Monitor in telemetry.   . Obesity hypoventilation syndrome  - CPAP each bedtime   . OBESITY, MORBID  Hypokalemia:  Replete as needed.     Code Status: full Family Communication: patient Disposition Plan: SNF when stable.    Consultants:  PCCM  cards  Procedures:  none    HPI/Subjective: She reports increasing pain in the feet. Increased her pain meds and added on xanax.  Objective: Filed Vitals:   09/18/14 1354  BP: 99/62  Pulse: 76  Temp: 97.7 F (36.5 C)  Resp: 20    Intake/Output Summary (Last 24 hours) at 09/18/14 1802 Last data filed at 09/18/14 1300  Gross per 24 hour  Intake 757.33 ml  Output   2126 ml  Net -1368.67 ml   Filed Weights   09/16/14 0449 09/17/14 0431 09/18/14 0544  Weight: 156.6 kg (345 lb 3.9 oz) 156.4 kg (344 lb 12.8 oz) 157.444 kg (347 lb 1.6 oz)    Exam:   General:  Pleasant/A+Ox3  Cardiovascular: irr  Respiratory: clear, no wheezing  Abdomen: obese  Musculoskeletal: 3+Edema, less redness on left leg   Skin: stage 3 sacral decubitus, .  Data Reviewed: Basic Metabolic Panel:  Recent Labs Lab 09/14/14 0615 09/15/14 0530 09/16/14 0608 09/17/14 0518 09/17/14 1530 09/18/14 0545  NA 137 132* 132* 135*  --  135*  K 3.7 3.4* 3.3* 2.9* 3.6* 3.5*  CL 97 93* 92* 93*  --  94*  CO2 28 28 28 29   --  29  GLUCOSE 225* 205* 224* 103*  --  117*  BUN 85* 90* 88* 85*  --  76*  CREATININE 2.83*  2.80* 2.52* 2.34*  --  2.14*  CALCIUM 8.4 8.4 8.0* 8.0*  --  7.9*  MG  --   --   --  2.0  --   --    Liver Function Tests:  Recent Labs Lab 09/12/14 0530 09/15/14 0530  AST 55* 67*  ALT 43* 39*  ALKPHOS 192* 276*  BILITOT 1.8* 0.9  PROT 5.9* 6.0  ALBUMIN 1.4* 1.4*   No results found for this basename: LIPASE, AMYLASE,  in the last 168 hours No results found for this basename: AMMONIA,  in the last 168 hours CBC:  Recent Labs Lab 09/12/14 0530 09/13/14 0342 09/14/14 0615  WBC 24.9* 15.2* 10.8*  NEUTROABS 23.8* 14.6*  --   HGB 9.1* 8.7* 8.5*  HCT 28.0* 26.3* 25.5*  MCV 96.9 93.6 92.7  PLT 281 226 220   Cardiac Enzymes: No results found for this  basename: CKTOTAL, CKMB, CKMBINDEX, TROPONINI,  in the last 168 hours BNP (last 3 results) No results found for this basename: PROBNP,  in the last 8760 hours CBG:  Recent Labs Lab 09/17/14 1622 09/17/14 2144 09/18/14 0637 09/18/14 1125 09/18/14 1622  GLUCAP 161* 179* 115* 118* 221*    Recent Results (from the past 240 hour(s))  CULTURE, BLOOD (ROUTINE X 2)     Status: None   Collection Time    09/09/14  6:20 PM      Result Value Ref Range Status   Specimen Description BLOOD RIGHT ARM   Final   Special Requests BOTTLES DRAWN AEROBIC AND ANAEROBIC 10CC   Final   Culture  Setup Time     Final   Value: 09/09/2014 22:28     Performed at [facility]   Culture     Final   Value: GROUP B STREP(S.AGALACTIAE)ISOLATED     Note: Gram Stain Report Called to,Read Back By and Verified With: ALICIA SPERRY ON 9/74/1638 AT 9:04P BY WILEJ     Performed at [facility]   Report Status 09/12/2014 FINAL   Final   Organism ID, Bacteria GROUP B STREP(S.AGALACTIAE)ISOLATED   Final  CULTURE, BLOOD (ROUTINE X 2)     Status: None   Collection Time    09/09/14  6:35 PM      Result Value Ref Range Status   Specimen Description BLOOD ARM RIGHT   Final   Special Requests BOTTLES DRAWN AEROBIC AND ANAEROBIC 0.2CC   Final   Culture  Setup Time     Final   Value: 09/10/2014 01:00     Performed at [facility]   Culture     Final   Value: NO GROWTH 5 DAYS     Performed at [facility]   Report Status 09/16/2014 FINAL   Final  MRSA PCR SCREENING     Status: None   Collection Time    09/10/14  4:02 PM      Result Value Ref Range Status   MRSA by PCR NEGATIVE  NEGATIVE Final   Comment:            The GeneXpert MRSA Assay (FDA     approved for NASAL specimens     only), is one component of a     comprehensive MRSA colonization     surveillance program. It is not     intended to diagnose MRSA     infection nor to guide or     monitor treatment for     MRSA  infections.  URINE CULTURE     Status: None   Collection Time    09/10/14  4:02 PM      Result Value Ref Range Status   Specimen Description URINE, CATHETERIZED   Final   Special Requests Normal   Final   Culture  Setup Time     Final   Value: 09/10/2014 17:04     Performed at [place]     Final   Value: 85,000 COLONIES/ML     Performed at [facility]   Culture     Final   Value: KLEBSIELLA PNEUMONIAE     Performed at [facility]   Report Status 09/15/2014 FINAL   Final   Organism ID, Bacteria KLEBSIELLA PNEUMONIAE   Final  CULTURE, BLOOD (ROUTINE X 2)     Status: None   Collection Time    09/15/14  1:21 PM      Result Value Ref Range Status   Specimen Description BLOOD RIGHT ANTECUBITAL   Final   Special Requests BOTTLES DRAWN AEROBIC ONLY 5CC   Final   Culture  Setup Time     Final   Value: 09/15/2014 18:00     Performed at [facility]   Culture     Final   Value:        BLOOD CULTURE RECEIVED NO GROWTH TO DATE CULTURE WILL BE HELD FOR 5 DAYS BEFORE ISSUING A FINAL NEGATIVE REPORT     Performed at [facility]   Report Status PENDING   Incomplete  CULTURE, BLOOD (ROUTINE X 2)     Status: None   Collection Time    09/15/14  1:35 PM      Result Value Ref Range Status   Specimen Description BLOOD LEFT ANTECUBITAL   Final   Special Requests BOTTLES DRAWN AEROBIC AND ANAEROBIC 10CC   Final   Culture  Setup Time     Final   Value: 09/15/2014 18:02     Performed at [facility]   Culture     Final   Value:        BLOOD CULTURE RECEIVED NO GROWTH TO DATE CULTURE WILL BE HELD FOR 5 DAYS BEFORE ISSUING A FINAL NEGATIVE REPORT     Performed at [facility]   Report Status PENDING   Incomplete     Studies: No results found.  Scheduled Meds: . amiodarone  400 mg Oral BID  . calcitRIOL  0.25 mcg Oral BID  . calcium-vitamin D  1 tablet Oral BID  .  ceFAZolin (ANCEF) IV  2 g Intravenous Q12H  .  collagenase   Topical Daily  . febuxostat  80 mg Oral Daily  . feeding supplement (PRO-STAT SUGAR FREE 64)  30 mL Oral BID WC  . furosemide  80 mg Intravenous BID  . gabapentin  300 mg Oral BID  . glyBURIDE  5 mg Oral BID WC  . insulin aspart  0-15 Units Subcutaneous TID WC  . insulin aspart  0-5 Units Subcutaneous QHS  . insulin glargine  20 Units Subcutaneous QHS  . iron polysaccharides  150 mg Oral BID  . levothyroxine  125 mcg Oral QAC breakfast  . linagliptin  5 mg Oral Daily  . multivitamin with minerals  1 tablet Oral Daily  . mupirocin cream   Topical Daily  . mupirocin ointment   Topical BID  . potassium chloride  40 mEq Oral BID  . saccharomyces boulardii  250 mg Oral BID  . sodium chloride  3 mL Intravenous Q12H  . Warfarin - Pharmacist Dosing Inpatient   Does not apply q1800   Continuous Infusions:  Antibiotics Given (last 72 hours)   Date/Time Action Medication Dose Rate   09/16/14 0023 Given   ceFAZolin (ANCEF) IVPB 2 g/50 mL premix 2 g 100 mL/hr   09/16/14 1109 Given   ceFAZolin (ANCEF) IVPB 2 g/50 mL premix 2 g 100 mL/hr   09/17/14 0100 Given   ceFAZolin (ANCEF) IVPB 2 g/50 mL premix 2 g 100 mL/hr   09/17/14 1305 Given   ceFAZolin (ANCEF) IVPB 2 g/50 mL premix 2 g 100 mL/hr   09/17/14 2253 Given   ceFAZolin (ANCEF) IVPB 2 g/50 mL premix 2 g 100 mL/hr   09/18/14 1241 Given   ceFAZolin (ANCEF) IVPB 2 g/50 mL premix 2 g 100 mL/hr      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   Obesity hypoventilation syndrome-    Cardiomyopathy, nonischemic- EF 30-35% June 2014   Type 2 diabetes mellitus   Chronic combined systolic and diastolic CHF   Long term (current) use of anticoagulants   CKD (chronic kidney disease) stage 4, GFR 15-29 ml/min   PAF- recurrent despite Amiodarone and multiple cardioversions   Mitral insufficiency, moderate to severe   Chronic massive  bilat LE lymphadema   Fall at home- 3 falls this summer, sound orthostatic   Hypotension   Acute on chronic renal failure    Time spent: 35 min    Joshuah Minella  [facility] Pager [phone] 7PM-7AM, please contact night-coverage at [email or web address], password [facility] 09/18/2014, 6:02 PM  LOS: 9 days

## 2014-09-19 DIAGNOSIS — R609 Edema, unspecified: Secondary | ICD-10-CM

## 2014-09-19 LAB — BASIC METABOLIC PANEL
Anion gap: 11 (ref 5–15)
BUN: 70 mg/dL — ABNORMAL HIGH (ref 6–23)
CO2: 29 mEq/L (ref 19–32)
Calcium: 7.9 mg/dL — ABNORMAL LOW (ref 8.4–10.5)
Chloride: 96 mEq/L (ref 96–112)
Creatinine, Ser: 2.05 mg/dL — ABNORMAL HIGH (ref 0.50–1.10)
GFR, EST AFRICAN AMERICAN: 28 mL/min — AB (ref >90)
GFR, EST NON AFRICAN AMERICAN: 24 mL/min — AB (ref >90)
Glucose, Bld: 159 mg/dL — ABNORMAL HIGH (ref 70–99)
POTASSIUM: 4.1 meq/L (ref 3.7–5.3)
SODIUM: 136 meq/L — AB (ref 137–147)

## 2014-09-19 LAB — GLUCOSE, CAPILLARY
GLUCOSE-CAPILLARY: 177 mg/dL — AB (ref 70–99)
GLUCOSE-CAPILLARY: 188 mg/dL — AB (ref 70–99)
Glucose-Capillary: 226 mg/dL — ABNORMAL HIGH (ref 70–99)
Glucose-Capillary: 200 mg/dL — ABNORMAL HIGH (ref 70–99)

## 2014-09-19 LAB — PROTIME-INR
INR: 3.08 — AB (ref 0.00–1.49)
PROTHROMBIN TIME: 31.8 s — AB (ref 11.6–15.2)

## 2014-09-19 MED ORDER — POTASSIUM CHLORIDE ER 10 MEQ PO TBCR
40.0000 meq | EXTENDED_RELEASE_TABLET | Freq: Two times a day (BID) | ORAL | Status: DC
  Administered 2014-09-20 – 2014-09-22 (×5): 40 meq via ORAL
  Filled 2014-09-19 (×6): qty 4

## 2014-09-19 MED ORDER — POTASSIUM CHLORIDE ER 10 MEQ PO TBCR
40.0000 meq | EXTENDED_RELEASE_TABLET | Freq: Three times a day (TID) | ORAL | Status: AC
  Administered 2014-09-19 (×3): 40 meq via ORAL
  Filled 2014-09-19 (×4): qty 4

## 2014-09-19 MED ORDER — WARFARIN SODIUM 1 MG PO TABS
1.0000 mg | ORAL_TABLET | Freq: Once | ORAL | Status: AC
  Administered 2014-09-19: 1 mg via ORAL
  Filled 2014-09-19: qty 1

## 2014-09-19 MED ORDER — METOLAZONE 2.5 MG PO TABS
2.5000 mg | ORAL_TABLET | Freq: Once | ORAL | Status: AC
  Administered 2014-09-19: 2.5 mg via ORAL
  Filled 2014-09-19: qty 1

## 2014-09-19 MED ORDER — CEFAZOLIN SODIUM-DEXTROSE 2-3 GM-% IV SOLR
2.0000 g | Freq: Three times a day (TID) | INTRAVENOUS | Status: AC
  Administered 2014-09-19 – 2014-09-22 (×10): 2 g via INTRAVENOUS
  Filled 2014-09-19 (×10): qty 50

## 2014-09-19 NOTE — Progress Notes (Signed)
PROGRESS NOTE  [name] IOE:[id number] DOB: [birth year] DOA: 09/09/2014 PCP: Elby Showers, MD  64 y.o. female with history of fibromyalgia, hypothyroidism, solitary kidney, hypothyroidism, diabetes, nonischemic cardiomyopathy with ejection fraction of 35%, atrial fibrillation who was admitted on 9/10 w/ recurrent Lower extremity cellulitis. PCCM asked to see in consult on 9/11 for persistent hypotension and worsening renal failure. Was in ICU on pressors until 9/14. Transferred to telemetry on 9/15.  Patient is on cefazolin from 9/14. She has completed 5 days of IV cefazolin.   Assessment/Plan: Septic shock due to Left lower leg cellulitis:  - abx cefazolin from 9/14 , completed 6 days of IV cefazolin, in addition to IV vancomycin for 4 days , and ciprofloxacin and flagyl for 3 days.   - Keep lower extremity elevated.  - blood culture X 2 - 1/2 group B strep. We will complete a course of 14 days of antibiotics.  - Wound care consulted again  - pt/ot  - steroids stopped.   . Type 2 diabetes mellitus:  - A1c 7.0  - lantus increased to 25 units.  - SSI while inpatient.  CBG (last 3)   Recent Labs  09/18/14 2118 09/19/14 0607 09/19/14 1130  GLUCAP 198* 177* 226*      . PAF (paroxysmal atrial fibrillation)  - Currently with a paced rhythm, monitor in telemetry.  - Coumadin to be dosed by pharmacy.  - Continue home medications  . Combined systolic and diastolic congestive heart failure- EF 30-35% June 2014   . Hypothyroidism, acquired, autoimmune: Recent TSH was 2.12 on 05/28/14.  - Continue levothyroxine   Gout: stable  -Continue home medication   . CKD (chronic kidney disease) stage 4, GFR 15-29 ml/min: Cre is slightly higher from baseline 2.5-3.00 to 3.38 on admission  - Monitor closely     . PACEMAKER, PERMANENT  - Monitor in telemetry.   . Obesity hypoventilation syndrome  - CPAP each bedtime   . OBESITY, MORBID  Hypokalemia:  Replete as needed.     Code Status: full Family Communication: patient Disposition Plan: SNF when stable.    Consultants:  PCCM  cards  Procedures:  none    HPI/Subjective: She reports increasing pain in the feet. Increased her pain meds and added on xanax.  Objective: Filed Vitals:   09/19/14 0539  BP: 100/60  Pulse: 104  Temp: 98.5 F (36.9 C)  Resp: 18    Intake/Output Summary (Last 24 hours) at 09/19/14 1326 Last data filed at 09/19/14 0900  Gross per 24 hour  Intake    720 ml  Output   2351 ml  Net  -1631 ml   Filed Weights   09/17/14 0431 09/18/14 0544 09/19/14 0539  Weight: 156.4 kg (344 lb 12.8 oz) 157.444 kg (347 lb 1.6 oz) 153.905 kg (339 lb 4.8 oz)    Exam:   General:  Pleasant/A+Ox3  Cardiovascular: irr  Respiratory: clear, no wheezing  Abdomen: obese  Musculoskeletal: 3+Edema, less redness on left leg   Skin: stage 3 sacral decubitus, .  Data Reviewed: Basic Metabolic Panel:  Recent Labs Lab 09/15/14 0530 09/16/14 0608 09/17/14 0518 09/17/14 1530 09/18/14 0545 09/19/14 0430  NA 132* 132* 135*  --  135* 136*  K 3.4* 3.3* 2.9* 3.6* 3.5* 4.1  CL 93* 92* 93*  --  94* 96  CO2 28 28 29   --  29 29  GLUCOSE 205* 224* 103*  --  117* 159*  BUN 90* 88* 85*  --  76*  70*  CREATININE 2.80* 2.52* 2.34*  --  2.14* 2.05*  CALCIUM 8.4 8.0* 8.0*  --  7.9* 7.9*  MG  --   --  2.0  --   --   --    Liver Function Tests:  Recent Labs Lab 09/15/14 0530  AST 67*  ALT 39*  ALKPHOS 276*  BILITOT 0.9  PROT 6.0  ALBUMIN 1.4*   No results found for this basename: LIPASE, AMYLASE,  in the last 168 hours No results found for this basename: AMMONIA,  in the last 168 hours CBC:  Recent Labs Lab 09/13/14 0342 09/14/14 0615  WBC 15.2* 10.8*  NEUTROABS 14.6*  --   HGB 8.7* 8.5*  HCT 26.3* 25.5*  MCV 93.6 92.7  PLT 226 220   Cardiac Enzymes: No results found for this basename: CKTOTAL, CKMB, CKMBINDEX, TROPONINI,  in the last 168 hours BNP (last 3  results) No results found for this basename: PROBNP,  in the last 8760 hours CBG:  Recent Labs Lab 09/18/14 1125 09/18/14 1622 09/18/14 2118 09/19/14 0607 09/19/14 1130  GLUCAP 118* 221* 198* 177* 226*    Recent Results (from the past 240 hour(s))  CULTURE, BLOOD (ROUTINE X 2)     Status: None   Collection Time    09/09/14  6:20 PM      Result Value Ref Range Status   Specimen Description BLOOD RIGHT ARM   Final   Special Requests BOTTLES DRAWN AEROBIC AND ANAEROBIC 10CC   Final   Culture  Setup Time     Final   Value: 09/09/2014 22:28     Performed at [facility]   Culture     Final   Value: GROUP B STREP(S.AGALACTIAE)ISOLATED     Note: Gram Stain Report Called to,Read Back By and Verified With: ALICIA SPERRY ON 7/82/9562 AT 9:04P BY WILEJ     Performed at [facility]   Report Status 09/12/2014 FINAL   Final   Organism ID, Bacteria GROUP B STREP(S.AGALACTIAE)ISOLATED   Final  CULTURE, BLOOD (ROUTINE X 2)     Status: None   Collection Time    09/09/14  6:35 PM      Result Value Ref Range Status   Specimen Description BLOOD ARM RIGHT   Final   Special Requests BOTTLES DRAWN AEROBIC AND ANAEROBIC 0.2CC   Final   Culture  Setup Time     Final   Value: 09/10/2014 01:00     Performed at [facility]   Culture     Final   Value: NO GROWTH 5 DAYS     Performed at [facility]   Report Status 09/16/2014 FINAL   Final  MRSA PCR SCREENING     Status: None   Collection Time    09/10/14  4:02 PM      Result Value Ref Range Status   MRSA by PCR NEGATIVE  NEGATIVE Final   Comment:            The GeneXpert MRSA Assay (FDA     approved for NASAL specimens     only), is one component of a     comprehensive MRSA colonization     surveillance program. It is not     intended to diagnose MRSA     infection nor to guide or     monitor treatment for     MRSA infections.  URINE CULTURE     Status: None   Collection Time  09/10/14  4:02 PM       Result Value Ref Range Status   Specimen Description URINE, CATHETERIZED   Final   Special Requests Normal   Final   Culture  Setup Time     Final   Value: 09/10/2014 17:04     Performed at [facility]     Final   Value: 85,000 COLONIES/ML     Performed at [facility]   Culture     Final   Value: KLEBSIELLA PNEUMONIAE     Performed at [facility]   Report Status 09/15/2014 FINAL   Final   Organism ID, Bacteria KLEBSIELLA PNEUMONIAE   Final  CULTURE, BLOOD (ROUTINE X 2)     Status: None   Collection Time    09/15/14  1:21 PM      Result Value Ref Range Status   Specimen Description BLOOD RIGHT ANTECUBITAL   Final   Special Requests BOTTLES DRAWN AEROBIC ONLY 5CC   Final   Culture  Setup Time     Final   Value: 09/15/2014 18:00     Performed at [facility]   Culture     Final   Value:        BLOOD CULTURE RECEIVED NO GROWTH TO DATE CULTURE WILL BE HELD FOR 5 DAYS BEFORE ISSUING A FINAL NEGATIVE REPORT     Performed at [facility]   Report Status PENDING   Incomplete  CULTURE, BLOOD (ROUTINE X 2)     Status: None   Collection Time    09/15/14  1:35 PM      Result Value Ref Range Status   Specimen Description BLOOD LEFT ANTECUBITAL   Final   Special Requests BOTTLES DRAWN AEROBIC AND ANAEROBIC 10CC   Final   Culture  Setup Time     Final   Value: 09/15/2014 18:02     Performed at [facility]   Culture     Final   Value:        BLOOD CULTURE RECEIVED NO GROWTH TO DATE CULTURE WILL BE HELD FOR 5 DAYS BEFORE ISSUING A FINAL NEGATIVE REPORT     Performed at [facility]   Report Status PENDING   Incomplete     Studies: No results found.  Scheduled Meds: . amiodarone  400 mg Oral BID  . calcitRIOL  0.25 mcg Oral BID  . calcium-vitamin D  1 tablet Oral BID  .  ceFAZolin (ANCEF) IV  2 g Intravenous Q8H  . collagenase   Topical Daily  . febuxostat  80 mg Oral Daily  . feeding supplement (PRO-STAT  SUGAR FREE 64)  30 mL Oral BID WC  . furosemide  80 mg Intravenous BID  . gabapentin  300 mg Oral BID  . glyBURIDE  5 mg Oral BID WC  . insulin aspart  0-15 Units Subcutaneous TID WC  . insulin aspart  0-5 Units Subcutaneous QHS  . insulin glargine  20 Units Subcutaneous QHS  . iron polysaccharides  150 mg Oral BID  . levothyroxine  125 mcg Oral QAC breakfast  . linagliptin  5 mg Oral Daily  . multivitamin with minerals  1 tablet Oral Daily  . mupirocin cream   Topical Daily  . mupirocin ointment   Topical BID  . potassium chloride  40 mEq Oral TID  . [START ON 09/20/2014] potassium chloride  40 mEq Oral BID  . saccharomyces boulardii  250  mg Oral BID  . sodium chloride  3 mL Intravenous Q12H  . warfarin  1 mg Oral ONCE-1800  . Warfarin - Pharmacist Dosing Inpatient   Does not apply q1800   Continuous Infusions:  Antibiotics Given (last 72 hours)   Date/Time Action Medication Dose Rate   09/17/14 0100 Given   ceFAZolin (ANCEF) IVPB 2 g/50 mL premix 2 g 100 mL/hr   09/17/14 1305 Given   ceFAZolin (ANCEF) IVPB 2 g/50 mL premix 2 g 100 mL/hr   09/17/14 2253 Given   ceFAZolin (ANCEF) IVPB 2 g/50 mL premix 2 g 100 mL/hr   09/18/14 1241 Given   ceFAZolin (ANCEF) IVPB 2 g/50 mL premix 2 g 100 mL/hr   09/19/14 0000 Given   ceFAZolin (ANCEF) IVPB 2 g/50 mL premix 2 g 100 mL/hr   09/19/14 1142 Given   ceFAZolin (ANCEF) IVPB 2 g/50 mL premix 2 g 100 mL/hr      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   Obesity hypoventilation syndrome-    Cardiomyopathy, nonischemic- EF 30-35% June 2014   Type 2 diabetes mellitus   Chronic combined systolic and diastolic CHF   Long term (current) use of anticoagulants   CKD (chronic kidney disease) stage 4, GFR 15-29 ml/min   PAF- recurrent despite Amiodarone and multiple cardioversions   Mitral insufficiency, moderate to severe   Chronic massive  bilat LE lymphadema   Fall at home- 3 falls this summer, sound orthostatic   Hypotension   Acute on chronic renal failure    Time spent: 35 min    Keiaira Donlan  [facility] Pager [phone] 7PM-7AM, please contact night-coverage at [email or web address], password [facility] 09/19/2014, 1:26 PM  LOS: 10 days

## 2014-09-19 NOTE — Progress Notes (Signed)
ANTICOAGULATION AND ANTIBIOTIC CONSULT NOTE - Follow Up Consult  Pharmacy Consult:  Coumadin; cefazolin Indication: atrial fibrillation; Group B Strep bacteremia  Allergies  Allergen Reactions  . Ivp Dye [Iodinated Diagnostic Agents] Shortness Of Breath    Turn red, can't breathe  . Betadine [Povidone Iodine]   . Diphenhydramine Hcl Swelling    In hands and eyes  . Fish Allergy Nausea And Vomiting  . Iohexol      Desc: PT TURNS RED AND WHEEZING   . Penicillins Other (See Comments)    Resp arrest as child. Tolerates cephalosporins.  . Povidone-Iodine Other (See Comments)    Wheezing, turn red, can't breath, and blisters  . Promethazine Hcl Other (See Comments)    Low Blood Pressure  . Tape     Blisters, can use paper tape for short periods  . Clindamycin Hives and Rash    wheezing  . Iodine Rash  . Morphine Sulfate Nausea And Vomiting and Rash  . Primaxin [Imipenem] Rash    Patient Measurements: Height: 5' 6.93" (170 cm) Weight: 339 lb 4.8 oz (153.905 kg) IBW/kg (Calculated) : 61.44  Vital Signs: Temp: 98.5 F (36.9 C) (09/20 0539) Temp src: Oral (09/20 0539) BP: 100/60 mmHg (09/20 0539) Pulse Rate: 104 (09/20 0539)  Labs:  Recent Labs  09/17/14 0518 09/18/14 0545 09/19/14 0430  LABPROT 31.3* 32.0* 31.8*  INR 3.02* 3.11* 3.08*  CREATININE 2.34* 2.14* 2.05*    Estimated Creatinine Clearance: 41.9 ml/min (by C-G formula based on Cr of 2.05).  Assessment: 14 YOF continues on Coumadin from PTA for history of Afib.  INR supra-therapeutic (3.4) on admit, then peaked at 5 likely due to drug-drug interactions with Cipro and Flagyl.  She then received Vitamin K 1mg  IV x 1 on 09/12/14.    INR is just above goal today and appears trend up has slowed down; no bleeding reported. Eating 50-100% of meals. Noted patient fell 9/15 but did not hit her head per documentation.  She also continues on cefazolin for Group B Strep bacteremia. She is afebrile, WBC have trended  down, and renal function improved.  Goal of Therapy:  INR 2-3  Eradication of infection   Plan:  - Coumadin 1 mg PO today - Daily PT / INR - Monitor for s/sx of bleeding  - Increase cefazolin to 2 g IV q8h - Plan 14 days of treatment  [facility], Pharm.D., BCPS Clinical Pharmacist Pager: [phone] 09/19/2014 1:09 PM

## 2014-09-19 NOTE — Progress Notes (Signed)
Pt refuses to wear CPAP. Stated that she will wear 2L [place]. RN at bedside.

## 2014-09-19 NOTE — Progress Notes (Signed)
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 Still has weeping from right leg.  Still needs Foley - not strong enough to stand and has a sacral pressure ulcer. Still needing IV diuretics and only access is central line (not a good PICC candidate - possible future HD). Creat continues to improve and K is back to normal.  CURRENT MEDS . amiodarone  400 mg Oral BID  . calcitRIOL  0.25 mcg Oral BID  . calcium-vitamin D  1 tablet Oral BID  .  ceFAZolin (ANCEF) IV  2 g Intravenous Q12H  . collagenase   Topical Daily  . febuxostat  80 mg Oral Daily  . feeding supplement (PRO-STAT SUGAR FREE 64)  30 mL Oral BID WC  . furosemide  80 mg Intravenous BID  . gabapentin  300 mg Oral BID  . glyBURIDE  5 mg Oral BID WC  . insulin aspart  0-15 Units Subcutaneous TID WC  . insulin aspart  0-5 Units Subcutaneous QHS  . insulin glargine  20 Units Subcutaneous QHS  . iron polysaccharides  150 mg Oral BID  . levothyroxine  125 mcg Oral QAC breakfast  . linagliptin  5 mg Oral Daily  . metolazone  2.5 mg Oral Once  . multivitamin with minerals  1 tablet Oral Daily  . mupirocin cream   Topical Daily  . mupirocin ointment   Topical BID  . potassium  chloride  40 mEq Oral TID  . [START ON 09/20/2014] potassium chloride  40 mEq Oral BID  . saccharomyces boulardii  250 mg Oral BID  . sodium chloride  3 mL Intravenous Q12H  . Warfarin - Pharmacist Dosing Inpatient   Does not apply q1800    OBJECTIVE   Intake/Output Summary (Last 24 hours) at 09/19/14 0842 Last data filed at 09/19/14 0705  Gross per 24 hour  Intake 1237.33 ml  Output   3001 ml  Net -1763.67 ml   Filed Weights   09/17/14 0431 09/18/14 0544 09/19/14 0539  Weight: 156.4 kg (344 lb 12.8 oz) 157.444 kg (347 lb 1.6 oz) 153.905 kg (339 lb 4.8 oz)    PHYSICAL EXAM Filed Vitals:   09/18/14 1048 09/18/14 1354 09/18/14 2059 09/19/14 0539  BP: 87/35 99/62 118/82 100/60  Pulse: 87 76 95 104  Temp:  97.7 F (36.5 C) 98.9 F (37.2 C) 98.5 F (36.9 C)  TempSrc:  Oral Oral Oral  Resp:  20 20 18   Height:      Weight:    153.905 kg (339 lb 4.8 oz)  SpO2:  97% 97% 96%   General: Alert, oriented x3, no distress  Head: no evidence of trauma, PERRL, EOMI, no  exophtalmos or lid lag, no myxedema, no xanthelasma; normal ears, nose and oropharynx  Neck: minimally elevatedjugular venous pulsations (sitting 90 deg upright); brisk carotid pulses without delay and no carotid bruits  Chest: clear to auscultation, no signs of consolidation by percussion or palpation, normal fremitus, symmetrical and full respiratory excursions  Cardiovascular: normal position and quality of the apical impulse, regular rhythm, normal first and second heart sounds, no rubs or gallops, 3/6 holosystolic murmur  Abdomen: no tenderness or distention, no masses by palpation, no abnormal pulsatility or arterial bruits, normal bowel sounds, no hepatosplenomegaly  Extremities: massive bilateral thigh and calf edema with weeping wounds, especially on R  Neurological: grossly nonfocal   LABS  CBC No results found for this basename: WBC, NEUTROABS, HGB, HCT, MCV, PLT,  in the last 72 hours Basic Metabolic  Panel  Recent Labs  09/17/14 0518  09/18/14 0545 09/19/14 0430  NA 135*  --  135* 136*  K 2.9*  < > 3.5* 4.1  CL 93*  --  94* 96  CO2 29  --  29 29  GLUCOSE 103*  --  117* 159*  BUN 85*  --  76* 70*  CREATININE 2.34*  --  2.14* 2.05*  CALCIUM 8.0*  --  7.9* 7.9*  MG 2.0  --   --   --   < > = values in this interval not displayed.  Radiology Studies Imaging results have been reviewed and No results found.  TELE Afib, 90-105   ASSESSMENT AND PLAN  Severe biventricular CHF with acute severe hypervolemia after treatment for septic shock due to lower extremity infected ulcers. Dry weight is uncertain, but definitely under 320 lb. AFib is persistent despite increments in amiodarone dose and multiple previous cardioversions. It clearly has a negative hemodynamic impact. Moderate to severe MR and TR, nonischemic cardiomyopathy, OSA/[place] sd., insulin requiring DM and stage 4 CKD recovering from acute on chronic renal failure. Responds well to metolazone/furosemide combo, but this leads to severe symptomatic hypokalemia and can only be used intermittently.  Brisk diuresis yesterday. K back to normal. Weight finally trending down. Will try another one-time dose of metolazone. Need to start looking for SNF: she prefers [facility] or [place].   Sanda Klein, MD, [facility] [phone] office [phone] pager 09/19/2014 8:42 AM

## 2014-09-20 LAB — GLUCOSE, CAPILLARY
GLUCOSE-CAPILLARY: 236 mg/dL — AB (ref 70–99)
GLUCOSE-CAPILLARY: 201 mg/dL — AB (ref 70–99)
Glucose-Capillary: 162 mg/dL — ABNORMAL HIGH (ref 70–99)
Glucose-Capillary: 165 mg/dL — ABNORMAL HIGH (ref 70–99)

## 2014-09-20 LAB — BASIC METABOLIC PANEL
ANION GAP: 10 (ref 5–15)
BUN: 65 mg/dL — AB (ref 6–23)
CHLORIDE: 94 meq/L — AB (ref 96–112)
CO2: 28 mEq/L (ref 19–32)
Calcium: 7.8 mg/dL — ABNORMAL LOW (ref 8.4–10.5)
Creatinine, Ser: 1.9 mg/dL — ABNORMAL HIGH (ref 0.50–1.10)
GFR calc non Af Amer: 26 mL/min — ABNORMAL LOW (ref >90)
GFR, EST AFRICAN AMERICAN: 31 mL/min — AB (ref >90)
Glucose, Bld: 171 mg/dL — ABNORMAL HIGH (ref 70–99)
POTASSIUM: 4.3 meq/L (ref 3.7–5.3)
Sodium: 132 mEq/L — ABNORMAL LOW (ref 137–147)

## 2014-09-20 LAB — PROTIME-INR
INR: 2.7 — ABNORMAL HIGH (ref 0.00–1.49)
PROTHROMBIN TIME: 28.7 s — AB (ref 11.6–15.2)

## 2014-09-20 LAB — CLOSTRIDIUM DIFFICILE BY PCR: Toxigenic C. Difficile by PCR: NEGATIVE

## 2014-09-20 MED ORDER — COLLAGENASE 250 UNIT/GM EX OINT
TOPICAL_OINTMENT | Freq: Every day | CUTANEOUS | Status: DC
  Administered 2014-09-21: 13:00:00 via TOPICAL
  Administered 2014-09-22: 1 via TOPICAL
  Administered 2014-09-23 – 2014-10-02 (×9): via TOPICAL
  Administered 2014-10-03: 1 via TOPICAL
  Administered 2014-10-04 – 2014-10-05 (×2): via TOPICAL
  Filled 2014-09-20 (×4): qty 30

## 2014-09-20 MED ORDER — INSULIN GLARGINE 100 UNIT/ML ~~LOC~~ SOLN
22.0000 [IU] | Freq: Every day | SUBCUTANEOUS | Status: DC
  Administered 2014-09-20 – 2014-10-25 (×36): 22 [IU] via SUBCUTANEOUS
  Filled 2014-09-20 (×37): qty 0.22

## 2014-09-20 MED ORDER — INSULIN ASPART 100 UNIT/ML ~~LOC~~ SOLN
2.0000 [IU] | Freq: Three times a day (TID) | SUBCUTANEOUS | Status: DC
  Administered 2014-09-20 – 2014-10-11 (×55): 2 [IU] via SUBCUTANEOUS
  Administered 2014-10-12: 17:00:00 via SUBCUTANEOUS
  Administered 2014-10-12 – 2014-10-26 (×29): 2 [IU] via SUBCUTANEOUS

## 2014-09-20 MED ORDER — HYDROCODONE-ACETAMINOPHEN 10-325 MG PO TABS
1.0000 | ORAL_TABLET | ORAL | Status: DC | PRN
  Administered 2014-09-20 – 2014-10-20 (×82): 1 via ORAL
  Filled 2014-09-20 (×77): qty 1
  Filled 2014-09-20: qty 2
  Filled 2014-09-20 (×8): qty 1

## 2014-09-20 MED ORDER — WARFARIN SODIUM 1 MG PO TABS
1.0000 mg | ORAL_TABLET | Freq: Once | ORAL | Status: AC
  Administered 2014-09-20: 1 mg via ORAL
  Filled 2014-09-20: qty 1

## 2014-09-20 MED ORDER — MUPIROCIN CALCIUM 2 % EX CREA
TOPICAL_CREAM | Freq: Every day | CUTANEOUS | Status: DC
  Administered 2014-09-21 – 2014-09-27 (×7): via TOPICAL
  Filled 2014-09-20 (×3): qty 15

## 2014-09-20 NOTE — Progress Notes (Signed)
ANTICOAGULATION  CONSULT NOTE - Follow Up Consult  Pharmacy Consult:  Coumadin Indication: atrial fibrillation  Allergies  Allergen Reactions  . Ivp Dye [Iodinated Diagnostic Agents] Shortness Of Breath    Turn red, can't breathe  . Betadine [Povidone Iodine]   . Diphenhydramine Hcl Swelling    In hands and eyes  . Fish Allergy Nausea And Vomiting  . Iohexol      Desc: PT TURNS RED AND WHEEZING   . Penicillins Other (See Comments)    Resp arrest as child. Tolerates cephalosporins.  . Povidone-Iodine Other (See Comments)    Wheezing, turn red, can't breath, and blisters  . Promethazine Hcl Other (See Comments)    Low Blood Pressure  . Tape     Blisters, can use paper tape for short periods  . Clindamycin Hives and Rash    wheezing  . Iodine Rash  . Morphine Sulfate Nausea And Vomiting and Rash  . Primaxin [Imipenem] Rash    Patient Measurements: Height: 5' 6.93" (170 cm) Weight: 345 lb (156.491 kg) IBW/kg (Calculated) : 61.44  Vital Signs: Temp: 98.3 F (36.8 C) (09/21 0426) Temp src: Oral (09/21 0426) BP: 98/44 mmHg (09/21 0426) Pulse Rate: 94 (09/21 0426)  Labs:  Recent Labs  09/18/14 0545 09/19/14 0430 09/20/14 0535  LABPROT 32.0* 31.8* 28.7*  INR 3.11* 3.08* 2.70*  CREATININE 2.14* 2.05* 1.90*    Estimated Creatinine Clearance: 45.7 ml/min (by C-G formula based on Cr of 1.9).  Assessment: [name] continues on Coumadin from PTA for history of Afib.  INR supra-therapeutic (3.4) on admit, then peaked at 5 likely due to drug-drug interactions with Cipro and Flagyl.  She then received Vitamin K 1mg  IV x 1 on 09/12/14.    INR now in goal range at 2.7. She has required lower doses than her home dosing of 2.5mg  daily except 5mg  on MWF.   Goal of Therapy:  INR 2-3 Monitor platelets by anticoagulation protocol: Yes  Plan:  1. Warfarin 1mg  po x1 tonight 2. Daily PT/INR 3. CBC in the morning 4. Anticipate patient will need to go home on a lower total weekly  dose of warfarin than she was on previously 5. Follow for s/s bleeding and changes in medications that can affect INR  Mayan Dolney D. Kamaiyah Uselton, PharmD, BCPS Clinical Pharmacist Pager: [phone] 09/20/2014 11:30 AM

## 2014-09-20 NOTE — Consult Note (Addendum)
WOC wound consult note  Reason for Consult: Consult requested for multiple wounds.  Pt is familiar to [facility] team from previous admission.  Refer to progress notes from 9/11. Pt had debridement of bilat thigh wounds and hydrotherapy by [facility] team a few months ago. They are following her as an outpatient and have ordered Santyl Q day. Pt has increased fluid to BLE and is weeping in multiple areas.  There are several new wounds since previous admission. Wound type: Left thigh full thickness wound: 2X4X1cm, 20% yellow, 80% red, slight odor, mod amt yellow drainage.  Right thigh full thickness 1X2X1cm, 80% red, 20% yellow, small amt green drainage, strong odor.  Right upper thigh: Full thickness .2X1cm, 100% yellow slough, small amt yellow drainage, no odor.  Measurement: Left outer calf with partial thickness stasis ulcers; 1X1.5X.1cm 1X2X.1cm, both are red and scabbed without odor or drainage.  These are dry and improved since previous admission. Left outer leg: 2X5X.1cm dark red and moist with mod amt yellow drainage, no odor.  Left upper thigh 8X6cm dark reddish purple deep tissue injury with mod amt yellow drainage, no odor.  Right outer thigh 2X3X.1cm cm dark purple blistered area with mod amt yellow drainage, no odor.  Stage 2 wound to left buttock;  3.5X2X.1cm; pink granulation buds visible interspersed with yellow. Yellow drainage from all open areas R/T large amt fluid loss.  Right buttock with 3X3cm patchy dark purple deep tissue injury.  Pt states she is allergic to foam silicone dressings and adhesive tape. Will respect wish to avoid foam dressings; barrier cream and non-adherent dressing applied to area with out tape to protect and repel moisture. Continue Santyl to 3 areas of thigh wounds and new area to upper left thigh, and Bactroban to left calf wounds and bilat outer thigh areas as previously ordered, according to patient. Pt has been changing her own dressings at home and can perform dressing  changes while in the hospital if desired.  Pt has high BMI and is remaining very moist to buttocks.  Difficult to reposition and refuses offer of air mattress or bariatric air bed which would increase airflow and decrease pressure to buttocks and outer posterior thigh wounds.  Explained that this would be helpful to promote healing, but she states she does not want one ordered. Please re-consult if further assistance is needed. Thank-you,  Julien Girt MSN, [place], [place], [place], [place]

## 2014-09-20 NOTE — Progress Notes (Signed)
Subjective: Tearful, discussed several issues --no chest pain, no SOB  Objective: Vital signs in last 24 hours: Temp:  [97.4 F (36.3 C)-98.4 F (36.9 C)] 98.3 F (36.8 C) (09/21 0426) Pulse Rate:  [94-104] 94 (09/21 0426) Resp:  [18-19] 18 (09/21 0426) BP: (92-110)/(44-52) 98/44 mmHg (09/21 0426) SpO2:  [94 %-100 %] 94 % (09/21 0426) Weight:  [345 lb (156.491 kg)] 345 lb (156.491 kg) (09/21 0426) Weight change: 5 lb 11.2 oz (2.586 kg) Last BM Date: 09/19/14 Intake/Output from previous day: -2436 ( since admit -12,761)  Wt 345 down from pk of 379, but increased from yest at 339? If that was correct.  09/20 0701 - 09/21 0700 In: 6967 [P.O.:840; IV Piggyback:350] Out: 8938 [Urine:3625; Stool:1] Intake/Output this shift:    PE: General:Pleasant affect- but depressed, tearful, NAD Skin:Warm and dry, brisk capillary refill- rt hand with tips of fingers purple. HEENT:normocephalic, sclera clear, mucus membranes moist Neck:supple, no JVD  Heart:irreg irreg without murmur, gallup, rub or click Lungs:clear without rales, rhonchi, or wheezes BOF:BPZWC, soft, non tender, + BS, do not palpate liver spleen or masses Ext:3-4+ lower ext edema,  2+ radial pulses, rt arm swollen, ulcers on lt lower leg with serous drainage.  Neuro:alert and oriented, MAE, follows commands, + facial symmetry  tele:  A fib with CVR  Lab Results: No results found for this basename: WBC, HGB, HCT, PLT,  in the last 72 hours BMET  Recent Labs  09/19/14 0430 09/20/14 0535  NA 136* 132*  K 4.1 4.3  CL 96 94*  CO2 29 28  GLUCOSE 159* 171*  BUN 70* 65*  CREATININE 2.05* 1.90*  CALCIUM 7.9* 7.8*   No results found for this basename: TROPONINI, CK, MB,  in the last 72 hours  Lab Results  Component Value Date   CHOL 149 11/30/2013   HDL 33* 11/30/2013   LDLCALC 89 11/30/2013   TRIG 137 11/30/2013   CHOLHDL 4.5 11/30/2013   Lab Results  Component Value Date   HGBA1C 7.0* 03/05/2014     Lab  Results  Component Value Date   TSH 2.121 05/28/2014      Studies/Results: 2D Echo: Left ventricle: The cavity size was normal. Wall thickness was normal. Systolic function was moderately reduced. The estimated ejection fraction was in the range of 35% to 40%. Diffuse hypokinesis. The study is not technically sufficient to allow evaluation of LV diastolic function. - Ventricular septum: Septal motion showed paradox. The contour showed diastolic flattening. These changes are consistent with RV volume overload. - Mitral valve: There was moderate to severe regurgitation. The acceleration rate of the regurgitant jet was reduced, consistent with a low dP/dt. - Left atrium: The atrium was moderately to severely dilated. - Right ventricle: The cavity size was mildly dilated. Wall thickness was normal. - Right atrium: The atrium was moderately dilated. - Atrial septum: No defect or patent foramen ovale was identified. - Tricuspid valve: There was malcoaptation of the valve leaflets. There was moderate-severe regurgitation directed centrally. - Pulmonary arteries: Systolic pressure was moderately increased. PA peak pressure: 48 mm Hg (S). - Pericardium, extracardiac: A trivial pericardial effusion was identified posterior to the heart.     Medications: I have reviewed the patient's current medications. Scheduled Meds: . amiodarone  400 mg Oral BID  . calcitRIOL  0.25 mcg Oral BID  . calcium-vitamin D  1 tablet Oral BID  .  ceFAZolin (ANCEF) IV  2 g Intravenous Q8H  .  collagenase   Topical Daily  . febuxostat  80 mg Oral Daily  . feeding supplement (PRO-STAT SUGAR FREE 64)  30 mL Oral BID WC  . furosemide  80 mg Intravenous BID  . gabapentin  300 mg Oral BID  . glyBURIDE  5 mg Oral BID WC  . insulin aspart  0-15 Units Subcutaneous TID WC  . insulin aspart  0-5 Units Subcutaneous QHS  . insulin glargine  20 Units Subcutaneous QHS  . iron polysaccharides  150 mg Oral BID  .  levothyroxine  125 mcg Oral QAC breakfast  . linagliptin  5 mg Oral Daily  . multivitamin with minerals  1 tablet Oral Daily  . mupirocin cream   Topical Daily  . mupirocin ointment   Topical BID  . potassium chloride  40 mEq Oral BID  . saccharomyces boulardii  250 mg Oral BID  . sodium chloride  3 mL Intravenous Q12H  . Warfarin - Pharmacist Dosing Inpatient   Does not apply q1800   Continuous Infusions:  PRN Meds:.sodium chloride, albuterol, ALPRAZolam, calcium carbonate, HYDROcodone-acetaminophen, hydrOXYzine, nitroGLYCERIN, sodium chloride, sodium chloride, sodium chloride, sodium chloride  Assessment/Plan: 64 y/o female with multiple medical problems including NICM (EF 30-35%) with severe MR/TR, mod-sev pulmonary hypertension, Persistent AF-recurrent despite increasing doses of Amiodarone and two DCCVs so far this year, hx PPM placement, morbid obesity, massive chronic LE edema, DM and CKD with a solitary kidney who was admitted 09/09/14 and treated for sepsis in the setting of severe cellulitis.   Voulme-overload: Weight recordings all over the board. Highest recorded weight 379 on 09/11/14. Now 344 lbs today, but still thought to be up 44 lbs over dry weight. She was 304 lbs on 09/09/14, but question accuracy. Continue to question accuracy, wt up and down but I&O continues  Neg  Now at 12 L --weeping from rt leg, still need foley, too weak to stand  Also with sacral ulcer. -- Net neg 12L. Continue diuresis with IV Lasix 80 BID  -- Continue strict I/Os, daily weights and low sodium diet.  -- Continue to monitor BP, renal function and electrolytes.  -- Prednisone discontinued 09/16/14 as her glycemic control is poor and it is impeding her diuresis also.  -- Still has a long way to go with diuresis and then will need SNF - she wants [facility] or [facility].   Severe biventricular CHF with acute severe hypervolemia after treatment for septic shock due to lower extremity infected ulcers.  Dry  weight is uncertain, but definitely under 320 lb.  Atrial Fibrillation: persistent, has been DCCV X 2 this year. rate is controlled in the 80s. Adding to volume overload.  Continue Amidarone and Warfarin. INR is therapeutic at 2.70.   Hypokalemia: resolved- K is 4.3 today.  Monitor closely in the setting of aggressive diuretic therapy.   Cellulitis of Lt lower leg: management per [facility].   CKD: SCr is improved from 2.8 to 2.5 to 1.90 today. Continue to monitor closely in the setting of diuretic therapy.   Leg cramps- treat pain.   Borderline BP:  Today 110/49 to 98/44- her norm is in the 28B systolic  DM2- glucose 151-761, followed by IM  PPM-  Per Dr. Sallyanne Kuster: Suspicion for endocarditis is low as her illness was hyperacute    LOS: 11 days   Time spent with pt. :15 minutes. [facility] R  Nurse Practitioner Certified Pager [phone] or after 5pm and on weekends call [phone] 09/20/2014, 8:47 AM   I have  seen and evaluated the patient this PM along with Cecilie Kicks, NP. I agree with her findings, examination as well as impression recommendations.  Continues to diurese well -- still has ~15-20 lb to diurese. Cellulitis improving -- Abx per TRH BP&HR stable.  Continue current dose of Lasix IV for now - if UOP decreases may use additional zaroxolyn.  Renal function continues to improve. For Afib- continue with Amiodarone @ current dose, no BP room for other Rx.     MD Time with pt: 10  min  HARDING,DAVID W, M.D., M.S. Interventional Cardiologist   Pager # [phone]

## 2014-09-20 NOTE — Progress Notes (Signed)
PROGRESS NOTE  [name] GQB:[id number] DOB: 06/03/1950 DOA: 09/09/2014 PCP: Elby Showers, MD  64 y.o. female with history of fibromyalgia, hypothyroidism, solitary kidney, hypothyroidism, diabetes, nonischemic cardiomyopathy with ejection fraction of 35%, atrial fibrillation who was admitted on 9/10 w/ recurrent Lower extremity cellulitis. PCCM asked to see in consult on 9/11 for persistent hypotension and worsening renal failure. Was in ICU on pressors until 9/14. Transferred to telemetry on 9/15.  Patient is on cefazolin from 9/14. She has completed 5 days of IV cefazolin.   Assessment/Plan: Septic shock due to Left lower leg cellulitis:  - abx cefazolin from 9/14 , completed 6 days of IV cefazolin, in addition to IV vancomycin for 4 days , and ciprofloxacin and flagyl for 3 days.   - Keep lower extremity elevated.  - blood culture X 2 - 1/2 group B strep. We will complete a course of 14 days of antibiotics.  - Wound care consulted again  - pt/ot  - steroids stopped.   . Type 2 diabetes mellitus:  - A1c 7.0  - lantus increased to 25 units.  - SSI while inpatient.  CBG (last 3)   Recent Labs  09/20/14 0627 09/20/14 1107 09/20/14 1604  GLUCAP 162* 165* 236*      . PAF (paroxysmal atrial fibrillation)  - Currently with a paced rhythm, monitor in telemetry.  - Coumadin to be dosed by pharmacy.  - Continue home medications  . Combined systolic and diastolic congestive heart failure- EF 30-35% June 2014   . Hypothyroidism, acquired, autoimmune: Recent TSH was 2.12 on 05/28/14.  - Continue levothyroxine   Gout: stable  -Continue home medication   . CKD (chronic kidney disease) stage 4, GFR 15-29 ml/min: Cre is slightly higher from baseline 2.5-3.00 to 3.38 on admission  - Monitor closely     . PACEMAKER, PERMANENT  - Monitor in telemetry.   . Obesity hypoventilation syndrome  - CPAP each bedtime   . OBESITY, MORBID  Hypokalemia:  Replete as needed.     Code Status: full Family Communication: patient Disposition Plan: SNF when stable.    Consultants:  PCCM  cards  Procedures:  none    HPI/Subjective: She reports increasing pain in the feet. Increased her pain meds and added on xanax.  Objective: Filed Vitals:   09/20/14 1544  BP: 88/50  Pulse:   Temp:   Resp:     Intake/Output Summary (Last 24 hours) at 09/20/14 1922 Last data filed at 09/20/14 1230  Gross per 24 hour  Intake    730 ml  Output   2727 ml  Net  -1997 ml   Filed Weights   09/18/14 0544 09/19/14 0539 09/20/14 0426  Weight: 157.444 kg (347 lb 1.6 oz) 153.905 kg (339 lb 4.8 oz) 156.491 kg (345 lb)    Exam:   General:  Pleasant/A+Ox3  Cardiovascular: irr  Respiratory: clear, no wheezing  Abdomen: obese  Musculoskeletal: 3+Edema, less redness on left leg   Skin: stage 3 sacral decubitus, .  Data Reviewed: Basic Metabolic Panel:  Recent Labs Lab 09/16/14 0608 09/17/14 0518 09/17/14 1530 09/18/14 0545 09/19/14 0430 09/20/14 0535  NA 132* 135*  --  135* 136* 132*  K 3.3* 2.9* 3.6* 3.5* 4.1 4.3  CL 92* 93*  --  94* 96 94*  CO2 28 29  --  29 29 28   GLUCOSE 224* 103*  --  117* 159* 171*  BUN 88* 85*  --  76* 70* 65*  CREATININE 2.52* 2.34*  --  2.14* 2.05* 1.90*  CALCIUM 8.0* 8.0*  --  7.9* 7.9* 7.8*  MG  --  2.0  --   --   --   --    Liver Function Tests:  Recent Labs Lab 09/15/14 0530  AST 67*  ALT 39*  ALKPHOS 276*  BILITOT 0.9  PROT 6.0  ALBUMIN 1.4*   No results found for this basename: LIPASE, AMYLASE,  in the last 168 hours No results found for this basename: AMMONIA,  in the last 168 hours CBC:  Recent Labs Lab 09/14/14 0615  WBC 10.8*  HGB 8.5*  HCT 25.5*  MCV 92.7  PLT 220   Cardiac Enzymes: No results found for this basename: CKTOTAL, CKMB, CKMBINDEX, TROPONINI,  in the last 168 hours BNP (last 3 results) No results found for this basename: PROBNP,  in the last 8760 hours CBG:  Recent  Labs Lab 09/19/14 1712 09/19/14 2127 09/20/14 0627 09/20/14 1107 09/20/14 1604  GLUCAP 200* 188* 162* 165* 236*    Recent Results (from the past 240 hour(s))  CULTURE, BLOOD (ROUTINE X 2)     Status: None   Collection Time    09/15/14  1:21 PM      Result Value Ref Range Status   Specimen Description BLOOD RIGHT ANTECUBITAL   Final   Special Requests BOTTLES DRAWN AEROBIC ONLY 5CC   Final   Culture  Setup Time     Final   Value: 09/15/2014 18:00     Performed at [facility]   Culture     Final   Value:        BLOOD CULTURE RECEIVED NO GROWTH TO DATE CULTURE WILL BE HELD FOR 5 DAYS BEFORE ISSUING A FINAL NEGATIVE REPORT     Performed at [facility]   Report Status PENDING   Incomplete  CULTURE, BLOOD (ROUTINE X 2)     Status: None   Collection Time    09/15/14  1:35 PM      Result Value Ref Range Status   Specimen Description BLOOD LEFT ANTECUBITAL   Final   Special Requests BOTTLES DRAWN AEROBIC AND ANAEROBIC 10CC   Final   Culture  Setup Time     Final   Value: 09/15/2014 18:02     Performed at [facility]   Culture     Final   Value:        BLOOD CULTURE RECEIVED NO GROWTH TO DATE CULTURE WILL BE HELD FOR 5 DAYS BEFORE ISSUING A FINAL NEGATIVE REPORT     Performed at [facility]   Report Status PENDING   Incomplete  CLOSTRIDIUM DIFFICILE BY PCR     Status: None   Collection Time    09/20/14  9:04 AM      Result Value Ref Range Status   C difficile by pcr NEGATIVE  NEGATIVE Final     Studies: No results found.  Scheduled Meds: . amiodarone  400 mg Oral BID  . calcitRIOL  0.25 mcg Oral BID  . calcium-vitamin D  1 tablet Oral BID  .  ceFAZolin (ANCEF) IV  2 g Intravenous Q8H  . [START ON 09/21/2014] collagenase   Topical Daily  . febuxostat  80 mg Oral Daily  . feeding supplement (PRO-STAT SUGAR FREE 64)  30 mL Oral BID WC  . furosemide  80 mg Intravenous BID  . gabapentin  300 mg Oral BID  . glyBURIDE  5 mg Oral BID WC  .  insulin aspart  0-15 Units Subcutaneous TID WC  . insulin aspart  0-5 Units Subcutaneous QHS  . insulin aspart  2 Units Subcutaneous TID WC  . insulin glargine  22 Units Subcutaneous QHS  . iron polysaccharides  150 mg Oral BID  . levothyroxine  125 mcg Oral QAC breakfast  . linagliptin  5 mg Oral Daily  . multivitamin with minerals  1 tablet Oral Daily  . [START ON 09/21/2014] mupirocin cream   Topical Daily  . potassium chloride  40 mEq Oral BID  . saccharomyces boulardii  250 mg Oral BID  . sodium chloride  3 mL Intravenous Q12H  . Warfarin - Pharmacist Dosing Inpatient   Does not apply q1800   Continuous Infusions:  Antibiotics Given (last 72 hours)   Date/Time Action Medication Dose Rate   09/17/14 2253 Given   ceFAZolin (ANCEF) IVPB 2 g/50 mL premix 2 g 100 mL/hr   09/18/14 1241 Given   ceFAZolin (ANCEF) IVPB 2 g/50 mL premix 2 g 100 mL/hr   09/19/14 0000 Given   ceFAZolin (ANCEF) IVPB 2 g/50 mL premix 2 g 100 mL/hr   09/19/14 1142 Given   ceFAZolin (ANCEF) IVPB 2 g/50 mL premix 2 g 100 mL/hr   09/19/14 2040 Given   ceFAZolin (ANCEF) IVPB 2 g/50 mL premix 2 g 100 mL/hr   09/20/14 3532 Given   ceFAZolin (ANCEF) IVPB 2 g/50 mL premix 2 g 100 mL/hr   09/20/14 1240 Given   ceFAZolin (ANCEF) IVPB 2 g/50 mL premix 2 g 100 mL/hr      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   Obesity hypoventilation syndrome-    Cardiomyopathy, nonischemic- EF 30-35% June 2014   Type 2 diabetes mellitus   Chronic combined systolic and diastolic CHF   Long term (current) use of anticoagulants   CKD (chronic kidney disease) stage 4, GFR 15-29 ml/min   PAF- recurrent despite Amiodarone and multiple cardioversions   Mitral insufficiency, moderate to severe   Chronic massive bilat LE lymphadema   Fall at home- 3 falls this summer, sound orthostatic   Hypotension   Acute on chronic renal  failure    Time spent: 35 min    Tynetta Bachmann  [facility] Pager [phone] 7PM-7AM, please contact night-coverage at [email or web address], password [facility] 09/20/2014, 7:22 PM  LOS: 11 days

## 2014-09-20 NOTE — Progress Notes (Signed)
Pt refuses CPAP at this time. She will call RT if she wants it later.

## 2014-09-21 ENCOUNTER — Ambulatory Visit: Payer: Medicare Other | Admitting: "Endocrinology

## 2014-09-21 DIAGNOSIS — M7989 Other specified soft tissue disorders: Secondary | ICD-10-CM

## 2014-09-21 LAB — CBC
HCT: 22.5 % — ABNORMAL LOW (ref 36.0–46.0)
HCT: 24.8 % — ABNORMAL LOW (ref 36.0–46.0)
Hemoglobin: 7.3 g/dL — ABNORMAL LOW (ref 12.0–15.0)
Hemoglobin: 7.9 g/dL — ABNORMAL LOW (ref 12.0–15.0)
MCH: 31.7 pg (ref 26.0–34.0)
MCH: 32 pg (ref 26.0–34.0)
MCHC: 32.4 g/dL (ref 30.0–36.0)
MCHC: 31.9 g/dL (ref 30.0–36.0)
MCV: 97.8 fL (ref 78.0–100.0)
MCV: 100.4 fL — AB (ref 78.0–100.0)
Platelets: 255 10*3/uL (ref 150–400)
Platelets: 308 10*3/uL (ref 150–400)
RBC: 2.3 MIL/uL — ABNORMAL LOW (ref 3.87–5.11)
RBC: 2.47 MIL/uL — AB (ref 3.87–5.11)
RDW: 19 % — AB (ref 11.5–15.5)
RDW: 18.4 % — AB (ref 11.5–15.5)
WBC: 8 10*3/uL (ref 4.0–10.5)
WBC: 10 10*3/uL (ref 4.0–10.5)

## 2014-09-21 LAB — BASIC METABOLIC PANEL
Anion gap: 10 (ref 5–15)
BUN: 64 mg/dL — AB (ref 6–23)
CALCIUM: 8 mg/dL — AB (ref 8.4–10.5)
CO2: 27 mEq/L (ref 19–32)
Chloride: 94 mEq/L — ABNORMAL LOW (ref 96–112)
Creatinine, Ser: 2.06 mg/dL — ABNORMAL HIGH (ref 0.50–1.10)
GFR calc Af Amer: 28 mL/min — ABNORMAL LOW (ref >90)
GFR calc non Af Amer: 24 mL/min — ABNORMAL LOW (ref >90)
Glucose, Bld: 179 mg/dL — ABNORMAL HIGH (ref 70–99)
Potassium: 4.6 mEq/L (ref 3.7–5.3)
SODIUM: 131 meq/L — AB (ref 137–147)

## 2014-09-21 LAB — CULTURE, BLOOD (ROUTINE X 2)
CULTURE: NO GROWTH
CULTURE: NO GROWTH

## 2014-09-21 LAB — GLUCOSE, CAPILLARY
Glucose-Capillary: 151 mg/dL — ABNORMAL HIGH (ref 70–99)
Glucose-Capillary: 183 mg/dL — ABNORMAL HIGH (ref 70–99)
Glucose-Capillary: 186 mg/dL — ABNORMAL HIGH (ref 70–99)
Glucose-Capillary: 223 mg/dL — ABNORMAL HIGH (ref 70–99)

## 2014-09-21 LAB — PROTIME-INR
INR: 2.74 — AB (ref 0.00–1.49)
Prothrombin Time: 29 seconds — ABNORMAL HIGH (ref 11.6–15.2)

## 2014-09-21 LAB — PREPARE RBC (CROSSMATCH)

## 2014-09-21 MED ORDER — WARFARIN SODIUM 1 MG PO TABS
1.0000 mg | ORAL_TABLET | Freq: Once | ORAL | Status: AC
Start: 2014-09-21 — End: 2014-09-21
  Administered 2014-09-21: 1 mg via ORAL
  Filled 2014-09-21: qty 1

## 2014-09-21 MED ORDER — FUROSEMIDE 10 MG/ML IJ SOLN: 40.0000 mg | Freq: Once | INTRAMUSCULAR | Status: DC

## 2014-09-21 MED ORDER — SODIUM CHLORIDE 0.9 % IV SOLN: Freq: Once | INTRAVENOUS | Status: DC

## 2014-09-21 NOTE — Progress Notes (Signed)
Inpatient Diabetes Program Recommendations  AACE/ADA: New Consensus Statement on Inpatient Glycemic Control (2013)  Target Ranges:  Prepandial:   less than 140 mg/dL      Peak postprandial:   less than 180 mg/dL (1-2 hours)      Critically ill patients:  140 - 180 mg/dL   Fasting cbg's fairly well controlled. Post-meal glucose is elevated following breakfast and lunch. Please consider the following:  Inpatient Diabetes Program Recommendations Insulin - Basal: xxxx Insulin - Meal Coverage: Please consider increase in meal coverage to 4 units tidwc. Thank you, Rosita Kea, RN, CNS, Diabetes Coordinator [phone])

## 2014-09-21 NOTE — Progress Notes (Addendum)
Physical Therapy Treatment Patient Details Name: [name] MRN: [id number] DOB: 1950-09-01 Today's Date: 09/21/2014    History of Present Illness 64 y.o. female  with history of fibromyalgia, hypothyroidism, solitary kidney, hypothyroidism, diabetes, nonischemic cardiomyopathy with ejection fraction of 35%, atrial fibrillation who presented to the emergency department with left-sided lower leg pain. She was admitted on 9/10 w/ recurrent Lower extremity cellulitis. PCCM asked to see in consult on 9/11 for persistent hypotension and worsening renal failure.  She had a previous admission for LE cellulitis in 07/2014 followed by [facility].  Pt had returned home with HHPT prior to this admission.     PT Comments    Pt admitted with above. Pt currently with functional limitations due to balance and endurance deficits.  Pt will benefit from skilled PT to increase their independence and safety with mobility to allow discharge to the venue listed below.   Follow Up Recommendations  SNF;Supervision/Assistance - 24 hour     Equipment Recommendations  None recommended by PT    Recommendations for Other Services       Precautions / Restrictions Precautions Precautions: Fall Restrictions Weight Bearing Restrictions: No    Mobility  Bed Mobility Overal bed mobility: Needs Assistance;+2 for physical assistance Bed Mobility: Rolling Rolling: Max assist;+2 for physical assistance   Supine to sit: +2 for physical assistance;Max assist Sit to supine: +2 for physical assistance;Total assist   General bed mobility comments:  Pt has leg lifter to assist with LEs to get to EOB.  Needed assist for elevation of trunk as well.  Pt used bedrails for rolling.  Pt had BM therefore rolled to clean pt multiple rolls and changed all pads.    Transfers                    Ambulation/Gait                 Stairs            Wheelchair Mobility    Modified Rankin (Stroke Patients  Only)       Balance Overall balance assessment: Needs assistance;History of Falls Sitting-balance support: Bilateral upper extremity supported;Feet supported Sitting balance-Leahy Scale: Fair Sitting balance - Comments: Pt sat 5 minutes with min assist for LEs due to pain.  Performed a few LE exercises with feet at EOB.                              Cognition Arousal/Alertness: Awake/alert Behavior During Therapy: Anxious Overall Cognitive Status: Within Functional Limits for tasks assessed                      Exercises General Exercises - Lower Extremity Ankle Circles/Pumps: AROM;Both;5 reps;Seated Long Arc Quad: AROM;Both;10 reps;Seated Hip ABduction/ADduction: AAROM;Both;5 reps;Supine    General Comments        Pertinent Vitals/Pain Pain Assessment: Faces Faces Pain Scale: Hurts even more Pain Location: bil LES Pain Descriptors / Indicators: Numbness;Pressure;Sore;Tightness Pain Intervention(s): Limited activity within patient's tolerance;Monitored during session;Repositioned    Home Living                      Prior Function            PT Goals (current goals can now be found in the care plan section) Progress towards PT goals: Not progressing toward goals - comment (pt fearful and self limiting)  Frequency  Min 2X/week    PT Plan Current plan remains appropriate    Co-evaluation PT/OT/SLP Co-Evaluation/Treatment: Yes Reason for Co-Treatment: For patient/therapist safety PT goals addressed during session: Mobility/safety with mobility       End of Session   Activity Tolerance: Patient limited by pain Patient left: in bed;with call bell/phone within reach;with family/visitor present     Time: 1111-1150 PT Time Calculation (min): 39 min  Charges:  $Therapeutic Activity: 8-22 mins $Self Care/Home Management: 8-22                    G Codes:      INGOLD,Juanelle Trueheart 01-Oct-2014, 12:17 PM [facility] Acute  Rehabilitation [phone] (pager)

## 2014-09-21 NOTE — Progress Notes (Signed)
PROGRESS NOTE  Cynthia VIRRUETA OXB:[id number] DOB: [birth year] DOA: 09/09/2014 PCP: Elby Showers, MD  64 y.o. female with history of fibromyalgia, hypothyroidism, solitary kidney, hypothyroidism, diabetes, nonischemic cardiomyopathy with ejection fraction of 35%, atrial fibrillation who was admitted on 9/10 w/ recurrent Lower extremity cellulitis. PCCM asked to see in consult on 9/11 for persistent hypotension and worsening renal failure. Was in ICU on pressors until 9/14. Transferred to telemetry on 9/15.  Patient is on cefazolin from 9/14.   Assessment/Plan: Septic shock due to Left lower leg cellulitis:  - abx cefazolin from 9/14 , completed 6 days of IV cefazolin, in addition to IV vancomycin for 4 days , and ciprofloxacin and flagyl for 3 days.   - Keep lower extremity elevated.  - blood culture X 2 - 1/2 group B strep. We will complete a course of 14 days of antibiotics.  - Wound care consulted again  - pt/ot  - steroids stopped.   . Type 2 diabetes mellitus:  - A1c 7.0  - lantus increased to 20 units.  - SSI while inpatient.  CBG (last 3)   Recent Labs  09/21/14 0659 09/21/14 1154 09/21/14 1623  GLUCAP 151* 183* 186*      . PAF (paroxysmal atrial fibrillation)  - Currently with a paced rhythm, monitor in telemetry.  - Coumadin to be dosed by pharmacy.  - Continue home medications  . Combined systolic and diastolic congestive heart failure- EF 30-35% June 2014   . Hypothyroidism, acquired, autoimmune: Recent TSH was 2.12 on 05/28/14.  - Continue levothyroxine   Gout: stable  -Continue home medication   . CKD (chronic kidney disease) stage 4, GFR 15-29 ml/min: Cre is slightly higher from baseline 2.5-3.00 to 3.38 on admission  - Monitor closely . Her creatinine is 1.9 . Repeat in am.     . PACEMAKER, PERMANENT  - Monitor in telemetry.   . Obesity hypoventilation syndrome  - CPAP each bedtime   . OBESITY, MORBID  Hypokalemia:  Replete as needed.    Anemia: Baseline hemoglobin is around 9. Her H&h is 7.3 to 7.9 today. Stool for occult blood ordered. 2 units of prbc transfusions ordered and lasix to be given between the transfusions. Last ferritin two months ago was 330 , with iron levels of 112. Suspect anemia of chronic disease. b12 and folate levels ordered . Continue to monitor.    Code Status: full Family Communication: patient Disposition Plan: SNF when stable.    Consultants:  PCCM  cards  Procedures:  none    HPI/Subjective: She reports increasing pain in the feet. Increased her pain meds and added on xanax.  Objective: Filed Vitals:   09/21/14 1414  BP: 98/54  Pulse: 101  Temp: 98.1 F (36.7 C)  Resp: 20    Intake/Output Summary (Last 24 hours) at 09/21/14 1710 Last data filed at 09/21/14 1300  Gross per 24 hour  Intake    580 ml  Output   2101 ml  Net  -1521 ml   Filed Weights   09/19/14 0539 09/20/14 0426 09/21/14 0559  Weight: 153.905 kg (339 lb 4.8 oz) 156.491 kg (345 lb) 158.26 kg (348 lb 14.4 oz)    Exam:   General:  Pleasant/A+Ox3  Cardiovascular: irregular, s1s2.   Respiratory: clear, no wheezing. Good air entry bilateral.   Abdomen: obese, soft non tender non distended bowel sounds heard.   Musculoskeletal: 3+Edema, less redness on left leg   Skin: stage 3 sacral decubitus, .multiple ulcers over the  lower extremities.   Data Reviewed: Basic Metabolic Panel:  Recent Labs Lab 09/16/14 0608 09/17/14 0518 09/17/14 1530 09/18/14 0545 09/19/14 0430 09/20/14 0535  NA 132* 135*  --  135* 136* 132*  K 3.3* 2.9* 3.6* 3.5* 4.1 4.3  CL 92* 93*  --  94* 96 94*  CO2 28 29  --  29 29 28   GLUCOSE 224* 103*  --  117* 159* 171*  BUN 88* 85*  --  76* 70* 65*  CREATININE 2.52* 2.34*  --  2.14* 2.05* 1.90*  CALCIUM 8.0* 8.0*  --  7.9* 7.9* 7.8*  MG  --  2.0  --   --   --   --    Liver Function Tests:  Recent Labs Lab 09/15/14 0530  AST 67*  ALT 39*  ALKPHOS 276*  BILITOT  0.9  PROT 6.0  ALBUMIN 1.4*   No results found for this basename: LIPASE, AMYLASE,  in the last 168 hours No results found for this basename: AMMONIA,  in the last 168 hours CBC:  Recent Labs Lab 09/21/14 0525 09/21/14 1330  WBC 8.0 10.0  HGB 7.3* 7.9*  HCT 22.5* 24.8*  MCV 97.8 100.4*  PLT 255 308   Cardiac Enzymes: No results found for this basename: CKTOTAL, CKMB, CKMBINDEX, TROPONINI,  in the last 168 hours BNP (last 3 results) No results found for this basename: PROBNP,  in the last 8760 hours CBG:  Recent Labs Lab 09/20/14 1604 09/20/14 2124 09/21/14 0659 09/21/14 1154 09/21/14 1623  GLUCAP 236* 201* 151* 183* 186*    Recent Results (from the past 240 hour(s))  CULTURE, BLOOD (ROUTINE X 2)     Status: None   Collection Time    09/15/14  1:21 PM      Result Value Ref Range Status   Specimen Description BLOOD RIGHT ANTECUBITAL   Final   Special Requests BOTTLES DRAWN AEROBIC ONLY 5CC   Final   Culture  Setup Time     Final   Value: 09/15/2014 18:00     Performed at [facility]   Culture     Final   Value: NO GROWTH 5 DAYS     Performed at [facility]   Report Status 09/21/2014 FINAL   Final  CULTURE, BLOOD (ROUTINE X 2)     Status: None   Collection Time    09/15/14  1:35 PM      Result Value Ref Range Status   Specimen Description BLOOD LEFT ANTECUBITAL   Final   Special Requests BOTTLES DRAWN AEROBIC AND ANAEROBIC 10CC   Final   Culture  Setup Time     Final   Value: 09/15/2014 18:02     Performed at [facility]   Culture     Final   Value: NO GROWTH 5 DAYS     Performed at [facility]   Report Status 09/21/2014 FINAL   Final  CLOSTRIDIUM DIFFICILE BY PCR     Status: None   Collection Time    09/20/14  9:04 AM      Result Value Ref Range Status   C difficile by pcr NEGATIVE  NEGATIVE Final     Studies: No results found.  Scheduled Meds: . amiodarone  400 mg Oral BID  . calcitRIOL  0.25 mcg Oral BID  .  calcium-vitamin D  1 tablet Oral BID  .  ceFAZolin (ANCEF) IV  2 g Intravenous Q8H  . collagenase   Topical  Daily  . febuxostat  80 mg Oral Daily  . furosemide  80 mg Intravenous BID  . gabapentin  300 mg Oral BID  . insulin aspart  0-15 Units Subcutaneous TID WC  . insulin aspart  0-5 Units Subcutaneous QHS  . insulin aspart  2 Units Subcutaneous TID WC  . insulin glargine  22 Units Subcutaneous QHS  . iron polysaccharides  150 mg Oral BID  . levothyroxine  125 mcg Oral QAC breakfast  . linagliptin  5 mg Oral Daily  . multivitamin with minerals  1 tablet Oral Daily  . mupirocin cream   Topical Daily  . potassium chloride  40 mEq Oral BID  . saccharomyces boulardii  250 mg Oral BID  . sodium chloride  3 mL Intravenous Q12H  . warfarin  1 mg Oral ONCE-1800  . Warfarin - Pharmacist Dosing Inpatient   Does not apply q1800   Continuous Infusions:  Antibiotics Given (last 72 hours)   Date/Time Action Medication Dose Rate   09/19/14 0000 Given   ceFAZolin (ANCEF) IVPB 2 g/50 mL premix 2 g 100 mL/hr   09/19/14 1142 Given   ceFAZolin (ANCEF) IVPB 2 g/50 mL premix 2 g 100 mL/hr   09/19/14 2040 Given   ceFAZolin (ANCEF) IVPB 2 g/50 mL premix 2 g 100 mL/hr   09/20/14 6962 Given   ceFAZolin (ANCEF) IVPB 2 g/50 mL premix 2 g 100 mL/hr   09/20/14 1240 Given   ceFAZolin (ANCEF) IVPB 2 g/50 mL premix 2 g 100 mL/hr   09/20/14 2117 Given   ceFAZolin (ANCEF) IVPB 2 g/50 mL premix 2 g 100 mL/hr   09/21/14 9528 Given   ceFAZolin (ANCEF) IVPB 2 g/50 mL premix 2 g 100 mL/hr   09/21/14 1223 Given   ceFAZolin (ANCEF) IVPB 2 g/50 mL premix 2 g 100 mL/hr      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   Obesity hypoventilation syndrome-    Cardiomyopathy, nonischemic- EF 30-35% June 2014   Type 2 diabetes mellitus   Chronic combined systolic and diastolic CHF   Long term (current) use of  anticoagulants   CKD (chronic kidney disease) stage 4, GFR 15-29 ml/min   PAF- recurrent despite Amiodarone and multiple cardioversions   Mitral insufficiency, moderate to severe   Chronic massive bilat LE lymphadema   Fall at home- 3 falls this summer, sound orthostatic   Hypotension   Acute on chronic renal failure    Time spent: 35 min    Dametra Whetsel  [facility] Pager [phone] 7PM-7AM, please contact night-coverage at [email or web address], password [facility] 09/21/2014, 5:10 PM  LOS: 12 days

## 2014-09-21 NOTE — Progress Notes (Signed)
Pt refused cpap tonight.  Pt was advised that RT is available all night and encouraged her to call/let her nurse know should she change her mind.

## 2014-09-21 NOTE — Progress Notes (Signed)
ANTICOAGULATION  CONSULT NOTE - Follow Up Consult  Pharmacy Consult:  Coumadin Indication: atrial fibrillation  Allergies  Allergen Reactions  . Ivp Dye [Iodinated Diagnostic Agents] Shortness Of Breath    Turn red, can't breathe  . Betadine [Povidone Iodine]   . Diphenhydramine Hcl Swelling    In hands and eyes  . Fish Allergy Nausea And Vomiting  . Iohexol      Desc: PT TURNS RED AND WHEEZING   . Penicillins Other (See Comments)    Resp arrest as child. Tolerates cephalosporins.  . Povidone-Iodine Other (See Comments)    Wheezing, turn red, can't breath, and blisters  . Promethazine Hcl Other (See Comments)    Low Blood Pressure  . Tape     Blisters, can use paper tape for short periods  . Clindamycin Hives and Rash    wheezing  . Iodine Rash  . Morphine Sulfate Nausea And Vomiting and Rash  . Primaxin [Imipenem] Rash    Patient Measurements: Height: 5' 6.93" (170 cm) Weight: 348 lb 14.4 oz (158.26 kg) IBW/kg (Calculated) : 61.44  Vital Signs: Temp: 98.4 F (36.9 C) (09/22 0559) Temp src: Oral (09/22 0559) BP: 106/34 mmHg (09/22 0559) Pulse Rate: 72 (09/22 0559)  Labs:  Recent Labs  09/19/14 0430 09/20/14 0535 09/21/14 0525  HGB  --   --  7.3*  HCT  --   --  22.5*  PLT  --   --  255  LABPROT 31.8* 28.7* 29.0*  INR 3.08* 2.70* 2.74*  CREATININE 2.05* 1.90*  --     Estimated Creatinine Clearance: 46.1 ml/min (by C-G formula based on Cr of 1.9).  Assessment: 42 YOF continues on Coumadin from PTA for history of Afib.  INR supra-therapeutic (3.4) on admit, then peaked at 5 likely due to drug-drug interactions with Cipro and Flagyl.  She then received Vitamin K 1mg  IV x 1 on 09/12/14.    INR now in goal range at 2.74. She has required lower doses than her home dosing of 2.5mg  daily except 5mg  on MWF.   Hgb and plts have fallen since last laboratory collection. No overt bleeding noted. Continue to follow closely.   Goal of Therapy:  INR 2-3 Monitor  platelets by anticoagulation protocol: Yes  Plan:  1. Warfarin 1mg  po x1 tonight 2. Daily PT/INR 3. CBC in the morning 4. Anticipate patient will need to go home on a lower total weekly dose of warfarin than she was on previously  Derreon Consalvo D. Marquay Kruse, PharmD, BCPS Clinical Pharmacist Pager: [phone] 09/21/2014 10:42 AM

## 2014-09-21 NOTE — Progress Notes (Signed)
NUTRITION FOLLOW UP  INTERVENTION: D/C Prostat liquid protein  RD to follow for nutrition care plan  NUTRITION DIAGNOSIS: Increased nutrient needs related to wound healing as evidenced by estimated nutrition needs, ongoing  Goal: Pt to meet >/= 90% of their estimated nutrition needs, progressing  Monitor:  PO intake, weight, labs, I/O's  ASSESSMENT: 64 y.o. Female with history of fibromyalgia, hypothyroidism, solitary kidney, hypothyroidism, diabetes, nonischemic cardiomyopathy with ejection fraction of 35%, atrial fibrillation who presented to ED with left-sided lower leg pain.  Patient reports a good appetite.  PO intake 75-100% per flowsheet records.  Increased nutrient needs ongoing given wound presence.  Refusing her Prostat liquid protein supplements.  Will D/C at this time.  Declining other supplement options.  Height: Ht Readings from Last 1 Encounters:  09/09/14 5' 6.93" (1.7 m)    Weight: Wt Readings from Last 1 Encounters:  09/21/14 348 lb 14.4 oz (158.26 kg)    BMI:  Body mass index is 54.76 kg/(m^2).  Estimated Nutritional Needs: Kcal: 1700-1900 Protein: 100-110 gm Fluid: 1.7-1.9 L  Skin:  Left thigh full thickness wound Right thigh full thickness wound Left outer calf with partial thickness stasis ulcers  Diet Order: Heart Healthy/Carbohydrate Modified    Intake/Output Summary (Last 24 hours) at 09/21/14 1206 Last data filed at 09/21/14 0900  Gross per 24 hour  Intake    580 ml  Output   2253 ml  Net  -1673 ml   Labs:   Recent Labs Lab 09/17/14 0518  09/18/14 0545 09/19/14 0430 09/20/14 0535  NA 135*  --  135* 136* 132*  K 2.9*  < > 3.5* 4.1 4.3  CL 93*  --  94* 96 94*  CO2 29  --  29 29 28   BUN 85*  --  76* 70* 65*  CREATININE 2.34*  --  2.14* 2.05* 1.90*  CALCIUM 8.0*  --  7.9* 7.9* 7.8*  MG 2.0  --   --   --   --   GLUCOSE 103*  --  117* 159* 171*  < > = values in this interval not displayed.  CBG (last 3)   Recent Labs  09/20/14 1604 09/20/14 2124 09/21/14 0659  GLUCAP 236* 201* 151*    Scheduled Meds: . amiodarone  400 mg Oral BID  . calcitRIOL  0.25 mcg Oral BID  . calcium-vitamin D  1 tablet Oral BID  .  ceFAZolin (ANCEF) IV  2 g Intravenous Q8H  . collagenase   Topical Daily  . febuxostat  80 mg Oral Daily  . feeding supplement (PRO-STAT SUGAR FREE 64)  30 mL Oral BID WC  . furosemide  80 mg Intravenous BID  . gabapentin  300 mg Oral BID  . insulin aspart  0-15 Units Subcutaneous TID WC  . insulin aspart  0-5 Units Subcutaneous QHS  . insulin aspart  2 Units Subcutaneous TID WC  . insulin glargine  22 Units Subcutaneous QHS  . iron polysaccharides  150 mg Oral BID  . levothyroxine  125 mcg Oral QAC breakfast  . linagliptin  5 mg Oral Daily  . multivitamin with minerals  1 tablet Oral Daily  . mupirocin cream   Topical Daily  . potassium chloride  40 mEq Oral BID  . saccharomyces boulardii  250 mg Oral BID  . sodium chloride  3 mL Intravenous Q12H  . warfarin  1 mg Oral ONCE-1800  . Warfarin - Pharmacist Dosing Inpatient   Does not apply [phone]  Continuous Infusions:   Past Medical History  Diagnosis Date  . Personal history of other diseases of circulatory system   . 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 Chronic lymphocytic thyroiditis   . Morbid obesity   . Other chronic pulmonary heart diseases   . Obstructive sleep apnea (adult) (pediatric)   . Other and unspecified hyperlipidemia   . Fibromyalgia   . Goiter   . Thyroiditis, autoimmune   . Hypothyroidism, acquired, autoimmune   . Solitary kidney, acquired   . Fatigue   . Hyperparathyroidism, secondary   . Vitamin D deficiency disease   . Hyperparathyroidism   . H/O varicose veins 03/27/12  . Type II or unspecified type diabetes mellitus without mention of complication, not stated as uncontrolled   . Diabetes mellitus type II   . Infections of kidney 03/27/12  . History of measles, mumps, or rubella  03/27/12  . Tubal pregnancy 12/23/76  . Unspecified essential hypertension     20 yrs ago  . Blood transfusion 1977    [facility]  . Complication of anesthesia 2010    hard to wake up after knee surgery  . Anemia   . Cardiomyopathy, nonischemic     EF 45 %  . Endometrial hyperplasia with atypia 05/05/12  . Diastolic HF (heart failure) 11/05/2012  . A-fib   . Echocardiogram abnormal 07/15/12    severe MR,mod to severe TR,mod to severe PA hypertension  . Pulmonary arterial hypertension   . PAF (paroxysmal atrial fibrillation), 1st episode since DCCV in Jan/14 03/19/2013  . Ulcers of both lower extremities, small, now with cellulitis 06/05/2013    Past Surgical History  Procedure Laterality Date  . Cholecystectomy    . Pacemaker placement  02/26/11    Medtronic Adapta  . Knee surgery    . Resection duplicate left kidney    . 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 Hernia repair  1999    ruptured ;  . D& c with hysteroscopy & cervical polypectomy  05/05/12  . Cardioversion N/A 02/06/2013    Successful  . Iud removal  01/30/13  . Ectopic pregnancy surgery  1978  . Cardioversion N/A 06/08/2013    Procedure: CARDIOVERSION;  Surgeon: Sanda Klein, MD;  Location: [place];  Service: Cardiovascular;  Laterality: N/A;  Room [phone]  . Cardioversion N/A 02/01/2014    Procedure: CARDIOVERSION;  Surgeon: Sanda Klein, MD;  Location: [place];  Service: Cardiovascular;  Laterality: N/A;  . Cardioversion N/A 06/02/2014    Procedure: CARDIOVERSION;  Surgeon: Sanda Klein, MD;  Location: [place];  Service: Cardiovascular;  Laterality: N/A;    Arthur Holms, RD, LDN Pager #: [phone] After-Hours Pager #: [phone]

## 2014-09-21 NOTE — Progress Notes (Signed)
Heart Failure Navigator Consult Note  Presentation: [name] is a 64 y.o. 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  This is a recurrent issue. Patient was recently hospitalized and treated for leg cellulitis in [facility] hospital. Then she had a rehabilitation for 2 weeks in early August. After that, she went home and has been doing okay until 5 days ago when patient started having worsening left leg pain. She has chronic leg edema secondary to congestive heart failure. She uses antibiotic cream intermittently for leg infection. In the past 5 days, her leg swelling is getting worse, and become more painful. She also had a fever with temperature 103. She had a flu shot on 09/03/14, which may have contributed to her fever per patient. She took oral doxycycline without significant help. Because of the worsening leg pain, she comes to the emergency room for further evaluation and treatment.    Past Medical History  Diagnosis Date  . Personal history of other diseases of circulatory system   . 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 Chronic lymphocytic thyroiditis   . Morbid obesity   . Other chronic pulmonary heart diseases   . Obstructive sleep apnea (adult) (pediatric)   . Other and unspecified hyperlipidemia   . Fibromyalgia   . Goiter   . Thyroiditis, autoimmune   . Hypothyroidism, acquired, autoimmune   . Solitary kidney, acquired   . Fatigue   . Hyperparathyroidism, secondary   . Vitamin D deficiency disease   . Hyperparathyroidism   . H/O varicose veins 03/27/12  . Type II or unspecified type diabetes mellitus without mention of complication, not stated as uncontrolled   . Diabetes mellitus type II   . Infections of kidney 03/27/12  . History of measles, mumps, or  rubella 03/27/12  . Tubal pregnancy 12/23/76  . Unspecified essential hypertension     20 yrs ago  . Blood transfusion 1977    [facility]  . Complication of anesthesia 2010    hard to wake up after knee surgery  . Anemia   . Cardiomyopathy, nonischemic     EF 45 %  . Endometrial hyperplasia with atypia 05/05/12  . Diastolic HF (heart failure) 11/05/2012  . A-fib   . Echocardiogram abnormal 07/15/12    severe MR,mod to severe TR,mod to severe PA hypertension  . Pulmonary arterial hypertension   . PAF (paroxysmal atrial fibrillation), 1st episode since DCCV in Jan/14 03/19/2013  . Ulcers of both lower extremities, small, now with cellulitis 06/05/2013    History   Social History  . Marital Status: Married    Spouse Name: N/A    Number of Children: N/A  . Years of Education: N/A   Occupational History  . retired Building services engineer    Social History Main Topics  . Smoking status: Never Smoker   . Smokeless tobacco: Never Used  . Alcohol Use: No  . Drug Use: No  . Sexual Activity: Yes    Partners: Male    Birth Control/ Protection: Post-menopausal   Other Topics Concern  . None   Social History Narrative  . None    ECHO:Study Conclusions--09/12/14  - Left ventricle: The cavity size was normal. Wall thickness was normal. Systolic function was moderately reduced. The estimated ejection fraction was in the range of 35% to 40%. Diffuse hypokinesis. The study is  not technically sufficient to allow evaluation of LV diastolic function. - Ventricular septum: Septal motion showed paradox. The contour showed diastolic flattening. These changes are consistent with RV volume overload. - Mitral valve: There was moderate to severe regurgitation. The acceleration rate of the regurgitant jet was reduced, consistent with a low dP/dt. - Left atrium: The atrium was moderately to severely dilated. - Right ventricle: The cavity size was mildly dilated. Wall thickness was normal. - Right atrium: The atrium  was moderately dilated. - Atrial septum: No defect or patent foramen ovale was identified. - Tricuspid valve: There was malcoaptation of the valve leaflets. There was moderate-severe regurgitation directed centrally. - Pulmonary arteries: Systolic pressure was moderately increased. PA peak pressure: 48 mm Hg (S). - Pericardium, extracardiac: A trivial pericardial effusion was identified posterior to the heart.  Transthoracic echocardiography. M-mode, complete 2D, spectral Doppler, and color Doppler. Birthdate: Patient birthdate: 1950-10-23. Age: Patient is 64 yr old. Sex: Gender: female. BMI: 61.2 kg/m^2. Blood pressure: 96/50 Patient status: Inpatient. Study date: Study date: 09/12/2014. Study time: 08:57 AM. Location: Bedside.   BNP    Component Value Date/Time   PROBNP 4452.0* 06/06/2013 1235    Education Assessment and Provision:  Detailed education and instructions provided on heart failure disease management including the following:  Signs and symptoms of Heart Failure When to call the physician Importance of daily weights Low sodium diet Fluid restriction Medication management Anticipated future follow-up appointments  Patient education given on each of the above topics.  Patient acknowledges understanding and acceptance of all instructions.  I spoke at length with the patient and her sister.  She was recently at [facility] and her sister decided to take her out and return her to home.  She is a retired Marine scientist and is very astute with her medications and is able to take Metolazone when needed with weight gain at home.  She knows that when she was discharged from [facility] recently her weight was up significantly.  She has received all education before and is able to teach back all topics.  According to office visit note she was 303lbs on 09/02/14.  Today she is documented at 348lbs.  Education Materials:  "Living Better With Heart Failure" Booklet, Daily Weight Tracker Tool  .   High Risk Criteria for Readmission and/or Poor Patient Outcomes:   EF <30%-No 35-40%  2 or more admissions in 6 months- Yes  Difficult social situation- Yes--was not happy at previous SNF--went home with sister  Demonstrates medication noncompliance- No    Barriers of Care: Ability to care for herself and to be compliant, Knowledge,   Discharge Planning:  Plans to discharge to SNF for Rehab

## 2014-09-21 NOTE — Progress Notes (Signed)
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 Happy her symptom is improving.   CURRENT MEDS . amiodarone  400 mg Oral BID  . calcitRIOL  0.25 mcg Oral BID  . calcium-vitamin D  1 tablet Oral BID  .  ceFAZolin (ANCEF) IV  2 g Intravenous Q8H  . collagenase   Topical Daily  . febuxostat  80 mg Oral Daily  . furosemide  80 mg Intravenous BID  . gabapentin  300 mg Oral BID  . insulin aspart  0-15 Units Subcutaneous TID WC  . insulin aspart  0-5 Units Subcutaneous QHS  . insulin aspart  2 Units Subcutaneous TID WC  . insulin glargine  22 Units Subcutaneous QHS  . iron polysaccharides  150 mg Oral BID  . levothyroxine  125 mcg Oral QAC breakfast  . linagliptin  5 mg Oral Daily  . multivitamin with minerals  1 tablet Oral Daily  . mupirocin cream   Topical Daily  . potassium chloride  40 mEq Oral BID  . saccharomyces boulardii  250 mg Oral BID  . sodium chloride  3 mL Intravenous Q12H  . warfarin  1 mg Oral ONCE-1800  . Warfarin - Pharmacist Dosing Inpatient   Does not apply q1800    OBJECTIVE  Filed Vitals:   09/20/14 1417 09/20/14 1544 09/20/14 2020 09/21/14 0559  BP: 87/67 88/50 96/58  106/34  Pulse: 92  107 72  Temp: 97.8 F (36.6 C)  98.7 F (37.1 C) 98.4 F (36.9  C)  TempSrc: Oral  Oral Oral  Resp: 18  18 20   Height:      Weight:    348 lb 14.4 oz (158.26 kg)  SpO2: 98%  98% 97%    Intake/Output Summary (Last 24 hours) at 09/21/14 1230 Last data filed at 09/21/14 0900  Gross per 24 hour  Intake    340 ml  Output   1451 ml  Net  -1111 ml   Filed Weights   09/19/14 0539 09/20/14 0426 09/21/14 0559  Weight: 339 lb 4.8 oz (153.905 kg) 345 lb (156.491 kg) 348 lb 14.4 oz (158.26 kg)    PHYSICAL EXAM  General: Pleasant, NAD. Morbidly obese Neuro: Alert and oriented X 3. Moves all extremities spontaneously. Psych: Normal affect. HEENT:  Normal  Neck: Supple without bruits or JVD. Lungs:  Resp regular and unlabored, mild rale near RLL base, otherwise lung moving air well Heart: tachycardic no s3, s4, or murmurs. Abdomen: Soft, non-tender, non-distended, BS + x 4.  Extremities: No clubbing, cyanosis. DP/PT/Radials 2+ and equal bilaterally. Diffuse anasarca, LLL erythematous with wound dressing in place.   Accessory Clinical Findings  CBC  Recent Labs  09/21/14 0525  WBC 8.0  HGB 7.3*  HCT 22.5*  MCV 97.8  PLT 242   Basic Metabolic Panel  Recent Labs  09/19/14 0430 09/20/14 0535  NA 136* 132*  K 4.1 4.3  CL 96 94*  CO2 29 28  GLUCOSE 159* 171*  BUN 70* 65*  CREATININE 2.05* 1.90*  CALCIUM 7.9* 7.8*    TELE A-fib with HR high 90s to low 100s    ECG  No new EKG  Echocardiogram 09/12/2014  LV EF: 35% - 40%  ------------------------------------------------------------------- Indications: CHF - 428.0.  ------------------------------------------------------------------- History: PMH: Pacemaker, Septic Shock, Anemia, Hypotension, Nonischemic Cardiomyopathy, PTHN with Severe TR. Dyspnea. Atrial fibrillation. Risk factors: Hypertension. Diabetes mellitus.  ------------------------------------------------------------------- Study Conclusions  - Left ventricle: The cavity size was normal. Wall thickness  was normal. Systolic function was moderately reduced. The estimated ejection fraction was in the range of 35% to 40%. Diffuse hypokinesis. The study is not technically sufficient to allow evaluation of LV diastolic function. - Ventricular septum: Septal motion showed paradox. The contour showed diastolic flattening. These changes are consistent with RV volume overload. - Mitral valve: There was moderate to severe regurgitation. The acceleration rate of the regurgitant jet was reduced, consistent with a low dP/dt. - Left atrium: The atrium was moderately to severely dilated. - Right ventricle: The cavity size was mildly dilated. Wall thickness was normal. - Right atrium: The atrium was moderately dilated. - Atrial septum: No defect or patent foramen ovale was identified. - Tricuspid valve: There was malcoaptation of the valve leaflets. There was moderate-severe regurgitation directed centrally. - Pulmonary arteries: Systolic pressure was moderately increased. PA peak pressure: 48 mm Hg (S). - Pericardium, extracardiac: A trivial pericardial effusion was identified posterior to the heart.     Radiology/Studies  Dg Chest Port 1 View  09/14/2014   CLINICAL DATA:  Airspace disease.  EXAM: PORTABLE CHEST - 1 VIEW  COMPARISON:  09/13/2014.  FINDINGS: Support apparatus: LEFT IJ central line unchanged with the tip in the upper to mid SVC. Pacemaker leads appear unchanged.  Cardiomediastinal Silhouette:  Enlarged, unchanged.  Lungs: Mild improvement in pulmonary edema. Pulmonary vascular congestion persists. Subsegmental atelectasis the LEFT mid lung. No focal consolidation. No pneumothorax.  Effusions:  None.  Other:  None.  IMPRESSION: 1. Stable support apparatus. 2. Unchanged cardiomegaly. 3. Improved central pulmonary edema with persistent pulmonary vascular congestion.   Electronically Signed   By: Dereck Ligas M.D.   On: 09/14/2014 08:10   Dg Chest Port 1 View  09/13/2014   CLINICAL  DATA:  Assess edema  EXAM: PORTABLE CHEST - 1 VIEW  COMPARISON:  September 12, 2014  FINDINGS: The heart size and mediastinal contours are stable. The heart size is enlarged. Left central venous line is identified with distal tip in the superior vena cava. Cardiac pacemaker is unchanged. There is enlargement of central pulmonary vessels worse compared to prior exam. There is no focal pneumonia or pleural effusion. The visualized skeletal structures are stable.  IMPRESSION: Slight interval worsened central pulmonary vascular congestion. There is no focal pneumonia or pleural effusion. Cardiomegaly.   Electronically Signed   By: Abelardo Diesel M.D.   On: 09/13/2014 07:31   Dg Chest Port 1 View  09/12/2014   CLINICAL DATA:  Evaluate pulmonary edema.  EXAM: PORTABLE CHEST - 1 VIEW  COMPARISON:  Chest x-ray 09/10/2014.  FINDINGS: There is a left-sided internal jugular central venous catheter with tip terminating in the mid superior vena cava. Right-sided pacemaker device in position with lead tips projecting over  the expected location of the right atrium and right ventricular apex. Lung volumes are normal. No consolidative airspace disease. No pleural effusions. Cephalization of the pulmonary vasculature, without frank pulmonary edema. Moderate cardiomegaly. The patient is rotated to the right on today's exam, resulting in distortion of the mediastinal contours and reduced diagnostic sensitivity and specificity for mediastinal pathology. Atherosclerosis in the thoracic aorta.  IMPRESSION: 1. Support apparatus, as above. 2. Cardiomegaly with pulmonary venous congestion, but no frank pulmonary edema. 3. Atherosclerosis.   Electronically Signed   By: Vinnie Langton M.D.   On: 09/12/2014 08:25   Dg Chest Port 1 View  09/10/2014   CLINICAL DATA:  Line placement  EXAM: PORTABLE CHEST - 1 VIEW  COMPARISON:  Portable exam 5621 hr compared to 06/03/2013  FINDINGS: RIGHT subclavian pacemaker leads project over RIGHT atrium  and RIGHT ventricle, unchanged.  New LEFT subclavian central venous catheter with tip projecting over mid to distal SVC.  Enlargement of cardiac silhouette with slight pulmonary vascular congestion.  No gross infiltrate, pleural effusion or pneumothorax.  Bones unremarkable.  IMPRESSION: No pneumothorax following central line placement.  Enlargement of cardiac silhouette with pulmonary vascular congestion.   Electronically Signed   By: Lavonia Dana M.D.   On: 09/10/2014 17:05   Dg Tibia/fibula Left Port  09/10/2014   CLINICAL DATA:  Edema and cellulitis  EXAM: PORTABLE LEFT TIBIA AND FIBULA - 2 VIEW  COMPARISON:  None.  FINDINGS: Frontal and lateral views were obtained. There is marked soft tissue swelling. No fracture or dislocation. No erosive change or bony destruction. There is no evidence of soft tissue air.  IMPRESSION: Diffuse soft tissue edema/swelling. No soft tissue air is seen. No erosive change or bony destruction. No fracture or dislocation.  If there remains concern for potential soft tissue abscess for subtle air, CT would be the imaging study of choice to further assess.   Electronically Signed   By: Lowella Grip M.D.   On: 09/10/2014 17:07    ASSESSMENT AND PLAN 64 y/o female with multiple medical problems including NICM (EF 30-35%) with severe MR/TR, mod-sev pulmonary hypertension, Persistent AF-recurrent despite increasing doses of Amiodarone and two DCCVs so far this year, hx PPM placement, morbid obesity, massive chronic LE edema, DM and CKD with a solitary kidney who was admitted 09/09/14 and treated for sepsis in the setting of severe cellulitis.   1. Acute on chronic systolic HF in the setting of severe MR/TR  - unclear dry weight, lbs on 09/09/14, but question accuracy. Dry weight should be less than 320 lbs  - continue IV lasix. Monitor renal function. Lung had mild rale in RLL basis, however essentially clear and move air well. Lots of third spacing of fluid into upper and  lower extremities. Currently urinating well, does not appear to need Metolazone at this time.  2. Persistent a-fib  - s/p 2 DCCV this yr.   - continue amiodarone and coumadin  - HR barely controlled at high 90-low 100s, unable to titrate rate control medication further  3. Cellulitis of LLE  - wound care consulted  4. CKD 5. DM 6. PPM- Per Dr. Sallyanne Kuster: Suspicion for endocarditis is low as her illness was hyperacute 7. Hypokalemia 8. Hypotension: unable to titrate rate control med 9. Severe anemia: Hgb 8.5 --> 7.3 this am. Repeat CBC, may need transfusion  - if worsen, may need to switch to IV heparin and hold coumadin   Signed, Woodward Ku Pager: [phone]  I have  seen and evaluated the patient this PM along with Almyra Deforest, PA. I agree with his findings, examination as well as impression recommendations.  I do not understand how her weight went from 339 to 348 despite ~5 L UOP.  Need accurate Weights.  Home dry wgt is ~320.  Still have a long way to go with diuresis.  PRN Metolazone.  With significant drop in Hgb - agree with recheck - if still low -- will need to hold warfarin; consider GI work-up. Transfuse to get Hgb > 9 given low EF.  WOCRN for leg ulcer-cellulitis -- has improved per pt report.  Afebrile.    BP actually seems stable.  HR stable on Amio @ current dose No BP room for additional rate control or afterload reduction.   Leonie Man, M.D., M.S. Interventional Cardiologist   Pager # [phone]

## 2014-09-21 NOTE — Progress Notes (Signed)
*  PRELIMINARY RESULTS* Vascular Ultrasound Right upper extremity venous duplex has been completed.  Preliminary findings: no evidence of deep or superficial thrombosis.   Landry Mellow, RDMS, RVT  09/21/2014, 10:17 AM

## 2014-09-21 NOTE — Progress Notes (Signed)
Occupational Therapy Treatment Patient Details Name: [name] MRN: [id number] DOB: 1950-08-25 Today's Date: 09/21/2014    History of present illness 64 y.o. female  with history of fibromyalgia, hypothyroidism, solitary kidney, hypothyroidism, diabetes, nonischemic cardiomyopathy with ejection fraction of 35%, atrial fibrillation who presented to the emergency department with left-sided lower leg pain. She was admitted on 9/10 w/ recurrent Lower extremity cellulitis. PCCM asked to see in consult on 9/11 for persistent hypotension and worsening renal failure.  She had a previous admission for LE cellulitis in 07/2014 followed by [facility].  Pt had returned home with HHPT prior to this admission.    OT comments  Pt agreeable to EOB activity only.  Despite max encouragement and 3 person assist and lift equipment, pt declines attempts to stand as she is very fearful.   Pt incontinent of bowel - assist with clean up bed level.  Follow Up Recommendations  SNF;Supervision/Assistance - 24 hour    Equipment Recommendations  None recommended by OT    Recommendations for Other Services      Precautions / Restrictions Precautions Precautions: Fall Restrictions Weight Bearing Restrictions: No       Mobility Bed Mobility Overal bed mobility: Needs Assistance;+2 for physical assistance Bed Mobility: Rolling Rolling: Max assist;+2 for physical assistance   Supine to sit: +2 for physical assistance;Max assist Sit to supine: +2 for physical assistance;Total assist   General bed mobility comments:  Pt has leg lifter to assist with LEs to get to EOB.  Needed assist for elevation of trunk as well.  Pt used bedrails for rolling.  Pt had BM therefore rolled to clean pt multiple rolls and changed all pads.    Transfers                 General transfer comment: Pt declined.  She if very anxious and despite max encouragement, refused to decline    Balance Overall balance assessment: Needs  assistance Sitting-balance support: Bilateral upper extremity supported;Feet supported Sitting balance-Leahy Scale: Fair Sitting balance - Comments: Pt sat 5 minutes with min assist for LEs due to pain.  Performed a few LE exercises with feet at EOB.                             ADL                               Toileting- Clothing Manipulation and Hygiene: +2 for physical assistance;Bed level;Total assistance         General ADL Comments: Pt adamantly refusing OOB activity despite max encouragement, use of lift equipment and 3 people present to assist.   Pt moved to EOB, then assisted with peri care due to incontinence of bowel.       Vision                     Perception     Praxis      Cognition   Behavior During Therapy: Anxious Overall Cognitive Status: Within Functional Limits for tasks assessed                       Extremity/Trunk Assessment               Exercises General Exercises - Lower Extremity Ankle Circles/Pumps: AROM;Both;5 reps;Seated Long Arc Quad: AROM;Both;10 reps;Seated Hip ABduction/ADduction: AAROM;Both;5 reps;Supine  Shoulder Instructions       General Comments      Pertinent Vitals/ Pain       Pain Assessment: Faces Faces Pain Scale: Hurts even more Pain Location: bil. LEs Pain Descriptors / Indicators: Aching;Shooting;Pins and needles;Numbness Pain Intervention(s): Limited activity within patient's tolerance;Monitored during session;Repositioned  Home Living                                          Prior Functioning/Environment              Frequency Min 2X/week     Progress Toward Goals  OT Goals(current goals can now be found in the care plan section)     Acute Rehab OT Goals Patient Stated Goal: not stated OT Goal Formulation: With patient Time For Goal Achievement: 09/28/14 Potential to Achieve Goals: Good ADL Goals Pt Will Perform Grooming: with min  assist;sitting (EOB) Pt Will Perform Upper Body Bathing: with min assist;bed level Pt Will Perform Lower Body Bathing: with mod assist;bed level Additional ADL Goal #1: Pt will tolerate EOB sitting x 8 mins with min guard assist Additional ADL Goal #2: Pt will move sit to stand with mod A +2 in prep for toilet transfers.   Plan Discharge plan remains appropriate    Co-evaluation      Reason for Co-Treatment: For patient/therapist safety PT goals addressed during session: Mobility/safety with mobility        End of Session     Activity Tolerance Patient limited by pain;Other (comment) (fearful)   Patient Left in bed;with call bell/phone within reach;with family/visitor present   Nurse Communication Mobility status        Time: 1111-1150 OT Time Calculation (min): 39 min  Charges:    Lucille Passy M 09/21/2014, 2:43 PM

## 2014-09-22 ENCOUNTER — Encounter: Payer: Medicare Other | Admitting: Cardiovascular Disease

## 2014-09-22 LAB — COMPREHENSIVE METABOLIC PANEL
ALBUMIN: 1.6 g/dL — AB (ref 3.5–5.2)
ALT: <5 U/L (ref 0–35)
ANION GAP: 10 (ref 5–15)
AST: 22 U/L (ref 0–37)
Alkaline Phosphatase: 194 U/L — ABNORMAL HIGH (ref 39–117)
BILIRUBIN TOTAL: 0.7 mg/dL (ref 0.3–1.2)
BUN: 64 mg/dL — AB (ref 6–23)
CHLORIDE: 96 meq/L (ref 96–112)
CO2: 28 mEq/L (ref 19–32)
CREATININE: 2.1 mg/dL — AB (ref 0.50–1.10)
Calcium: 7.9 mg/dL — ABNORMAL LOW (ref 8.4–10.5)
GFR calc Af Amer: 27 mL/min — ABNORMAL LOW (ref >90)
GFR, EST NON AFRICAN AMERICAN: 23 mL/min — AB (ref >90)
GLUCOSE: 139 mg/dL — AB (ref 70–99)
Potassium: 4.9 mEq/L (ref 3.7–5.3)
Sodium: 134 mEq/L — ABNORMAL LOW (ref 137–147)
Total Protein: 6.4 g/dL (ref 6.0–8.3)

## 2014-09-22 LAB — GLUCOSE, CAPILLARY
GLUCOSE-CAPILLARY: 157 mg/dL — AB (ref 70–99)
Glucose-Capillary: 148 mg/dL — ABNORMAL HIGH (ref 70–99)
Glucose-Capillary: 154 mg/dL — ABNORMAL HIGH (ref 70–99)
Glucose-Capillary: 156 mg/dL — ABNORMAL HIGH (ref 70–99)

## 2014-09-22 LAB — CBC
HCT: 26.5 % — ABNORMAL LOW (ref 36.0–46.0)
HEMOGLOBIN: 8.5 g/dL — AB (ref 12.0–15.0)
MCH: 32 pg (ref 26.0–34.0)
MCHC: 32.1 g/dL (ref 30.0–36.0)
MCV: 99.6 fL (ref 78.0–100.0)
Platelets: 259 10*3/uL (ref 150–400)
RBC: 2.66 MIL/uL — ABNORMAL LOW (ref 3.87–5.11)
RDW: 18.2 % — AB (ref 11.5–15.5)
WBC: 7.6 10*3/uL (ref 4.0–10.5)

## 2014-09-22 LAB — PROTIME-INR
INR: 2.07 — ABNORMAL HIGH (ref 0.00–1.49)
Prothrombin Time: 23.3 seconds — ABNORMAL HIGH (ref 11.6–15.2)

## 2014-09-22 LAB — VITAMIN B12: VITAMIN B 12: 1574 pg/mL — AB (ref 211–911)

## 2014-09-22 LAB — FOLATE: FOLATE: 15.3 ng/mL

## 2014-09-22 MED ORDER — WARFARIN SODIUM 2.5 MG PO TABS
2.5000 mg | ORAL_TABLET | Freq: Once | ORAL | Status: AC
  Administered 2014-09-22: 2.5 mg via ORAL
  Filled 2014-09-22: qty 1

## 2014-09-22 MED ORDER — POTASSIUM CHLORIDE ER 10 MEQ PO TBCR
20.0000 meq | EXTENDED_RELEASE_TABLET | Freq: Every day | ORAL | Status: DC
  Administered 2014-09-23 – 2014-10-06 (×14): 20 meq via ORAL
  Filled 2014-09-22 (×14): qty 2

## 2014-09-22 MED ORDER — FUROSEMIDE 10 MG/ML IJ SOLN
80.0000 mg | Freq: Every day | INTRAMUSCULAR | Status: DC
  Administered 2014-09-23: 80 mg via INTRAVENOUS
  Filled 2014-09-22: qty 8

## 2014-09-22 NOTE — Progress Notes (Signed)
ANTICOAGULATION and ANTIBIOTIC  CONSULT NOTE - Follow Up Consult  Pharmacy Consult:  Coumadin and cefazolin Indication: atrial fibrillation  Allergies  Allergen Reactions  . Ivp Dye [Iodinated Diagnostic Agents] Shortness Of Breath    Turn red, can't breathe  . Betadine [Povidone Iodine]   . Diphenhydramine Hcl Swelling    In hands and eyes  . Fish Allergy Nausea And Vomiting  . Iohexol      Desc: PT TURNS RED AND WHEEZING   . Penicillins Other (See Comments)    Resp arrest as child. Tolerates cephalosporins.  . Povidone-Iodine Other (See Comments)    Wheezing, turn red, can't breath, and blisters  . Promethazine Hcl Other (See Comments)    Low Blood Pressure  . Tape     Blisters, can use paper tape for short periods  . Clindamycin Hives and Rash    wheezing  . Iodine Rash  . Morphine Sulfate Nausea And Vomiting and Rash  . Primaxin [Imipenem] Rash    Patient Measurements: Height: 5' 6.93" (170 cm) Weight: 344 lb 3.2 oz (156.128 kg) IBW/kg (Calculated) : 61.44  Vital Signs: Temp: 98.6 F (37 C) (09/23 0545) Temp src: Oral (09/23 0545) BP: 85/57 mmHg (09/23 1007) Pulse Rate: 100 (09/23 1007)  Labs:  Recent Labs  09/20/14 0535  09/21/14 0525 09/21/14 1330 09/21/14 1730 09/22/14 0500  HGB  --   < > 7.3* 7.9*  --  8.5*  HCT  --   --  22.5* 24.8*  --  26.5*  PLT  --   --  255 308  --  259  LABPROT 28.7*  --  29.0*  --   --  23.3*  INR 2.70*  --  2.74*  --   --  2.07*  CREATININE 1.90*  --   --   --  2.06* 2.10*  < > = values in this interval not displayed.  Estimated Creatinine Clearance: 41.3 ml/min (by C-G formula based on Cr of 2.1).  Assessment: 12 YOF continues on Coumadin from PTA for history of Afib.  INR supra-therapeutic (3.4) on admit, then peaked at 5 likely due to drug-drug interactions with Cipro and Flagyl.  She then received Vitamin K 1mg  IV x 1 on 09/12/14.   She has required lower doses than her home regimen during this admission. INR  currently 2.07. Hgb and plts with slight increases today. No bleeding noted.  She also continues on cefazolin for LLE cellulitis leading to septic shock. 1/2 group B strep grew in blood cultures. She originally started antibiotics on 9/10, making today D#14 total of antibiotics. WBC normal, afebrile. Renal function has been stable.  Goal of Therapy:  INR 2-3 Monitor platelets by anticoagulation protocol: Yes Eradication of infection  Plan:  1. Warfarin 2.5mg  po x1 tonight 2. Daily PT/INR 3. CBC in the morning 4. Anticipate patient will need to go home on a lower total weekly dose of warfarin than she was on previously 5. Continue cefazolin 2g IV q8h with stop date of today as completed 14 days of antibiotics. Pharmacy to sign off from antibiotic dosing- please reconsult if needed.  Corvette Orser D. Arely Tinner, PharmD, BCPS Clinical Pharmacist Pager: [phone] 09/22/2014 10:50 AM

## 2014-09-22 NOTE — Progress Notes (Signed)
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 female with history of fibromyalgia, hypothyroidism, solitary kidney, DM, NICM w/ EF 35%, atrial fibrillation who was admitted on 9/10 w/ recurrent LE cellulitis. PCCM saw 9/11 for persistent hypotension/sepsis and worsening renal failure. Was in ICU on pressors until 9/14. Transferred to telemetry on 9/15. Patient is on cefazolin from 9/14.  SUBJECTIVE:   OBJECTIVE Filed Vitals:   09/22/14 0345 09/22/14 0451 09/22/14 0545 09/22/14 1007  BP: 98/47 90/50 91/61  85/57  Pulse: 94 97 97 100  Temp: 98.9 F (37.2 C) 98.8 F (37.1 C) 98.6 F (37 C)   TempSrc: Oral Oral Oral   Resp: 22 16 18    Height:      Weight:  344 lb 3.2 oz (156.128 kg)    SpO2: 94% 95% 95%     Intake/Output Summary (Last 24 hours) at 09/22/14 1313 Last data filed at 09/22/14 1220  Gross per 24 hour  Intake      0 ml  Output   1300 ml  Net  -1300 ml   Filed Weights   09/20/14 0426 09/21/14 0559 09/22/14 0451  Weight: 345 lb (156.491 kg) 348 lb 14.4 oz (158.26 kg) 344 lb 3.2 oz (156.128 kg)    PHYSICAL EXAM General: Well developed, well nourished, female in no acute distress. Head: Normocephalic, atraumatic.  Neck: Supple without bruits, JVD minimal  elevation. Lungs:  Resp regular and unlabored, some basilar rales. Heart: Irregular R&R, S1, S2, no S3, S4, or murmur; no rub. Abdomen: Soft, non-tender, non-distended, BS + x 4.  Extremities: No clubbing, cyanosis, 2-3 + edema. No open wounds seen, dressings left in place.  Neuro: Alert and oriented X 3. Moves all extremities spontaneously. Psych: Normal affect.  LABS: CBC: Recent Labs  09/21/14 1330 09/22/14 0500  WBC 10.0 7.6  HGB 7.9* 8.5*  HCT 24.8* 26.5*  MCV 100.4* 99.6  PLT 308 259   INR: Recent Labs  09/22/14 0500  INR 1.61*   Basic Metabolic Panel: Recent Labs  09/21/14 1730 09/22/14 0500  NA 131* 134*  K 4.6 4.9  CL 94* 96  CO2 27 28  GLUCOSE 179* 139*  BUN 64* 64*  CREATININE 2.06* 2.10*  CALCIUM 8.0* 7.9*   Liver Function Tests: Recent Labs  09/22/14 0500  AST 22  ALT <5  ALKPHOS 194*  BILITOT 0.7  PROT 6.4  ALBUMIN 1.6*   Anemia Panel: Recent Labs  09/22/14 0500  VITAMINB12 1574*  FOLATE 15.3    TELE: atrial fib, rate generally < 100.   Current Medications:  . sodium chloride   Intravenous Once  . amiodarone  400 mg Oral BID  . calcitRIOL  0.25 mcg Oral BID  . calcium-vitamin D  1 tablet Oral BID  .  ceFAZolin (ANCEF) IV  2 g Intravenous Q8H  . collagenase   Topical Daily  . febuxostat  80 mg Oral Daily  . furosemide  80 mg Intravenous BID  . gabapentin  300 mg Oral BID  . insulin aspart  0-15 Units Subcutaneous TID WC  . insulin aspart  0-5 Units Subcutaneous QHS  . insulin aspart  2 Units Subcutaneous TID WC  . insulin glargine  22 Units Subcutaneous QHS  . iron polysaccharides  150 mg Oral BID  . levothyroxine  125 mcg Oral QAC breakfast  . linagliptin  5 mg Oral Daily  . multivitamin with minerals  1 tablet Oral Daily  . mupirocin cream   Topical Daily  . potassium chloride  40 mEq Oral BID  . saccharomyces boulardii  250 mg Oral BID  . sodium chloride  3 mL Intravenous Q12H  . warfarin  2.5 mg Oral ONCE-1800  .  Warfarin - Pharmacist Dosing Inpatient   Does not apply q1800      ASSESSMENT AND PLAN: 64 y/o female with multiple medical problems including NICM (EF 30-35%) with severe MR/TR, mod-sev pulmonary hypertension, Persistent AF-recurrent despite increasing doses of Amiodarone and two DCCVs so far this year, hx PPM placement, morbid obesity, massive chronic LE edema, DM and CKD with a solitary kidney who was admitted 09/09/14 and treated for sepsis in the setting of severe cellulitis.   1. Acute on chronic systolic HF in the setting of severe MR/TR  - unclear dry weight, 335 lbs on 09/10/14. Dry weight should be less than 320 lbs, but this is on standing scale. She is not able to stand now. - continue IV lasix. Monitor renal function. Lung had mild rales in RLL bases, however essentially clear and move air well. Lots of third spacing of fluid into upper and lower extremities. Currently urinating well, does not appear to need Metolazone at this time.   2. Persistent a-fib  - s/p 2 DCCV this yr.  - continue amiodarone and coumadin  - HR borderline controlled at high 90-low 100s, no significant high-rate episodes -  unable to titrate rate control medication further   3. Cellulitis of LLE  - wound care consulted  - per IM  4. CKD - per IM  5. DM  - per IM  6. PPM- Per Dr. Sallyanne Kuster: "Suspicion for endocarditis is low as her illness was hyperacute"   7. Hypokalemia  - improved, on KDUR 40 meq bid and K+ trending up, will decrease to 20 meq daily for now  8. Hypotension: unable to titrate rate control med   9. Severe anemia: Hgb 8.5 this am. 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   Obesity hypoventilation syndrome-     Cardiomyopathy, nonischemic- EF 30-35% June 2014   Type 2 diabetes mellitus   Chronic combined systolic and diastolic CHF   Long term (current) use of anticoagulants   CKD (chronic kidney disease) stage 4, GFR 15-29 ml/min   PAF- recurrent despite Amiodarone and multiple cardioversions   Mitral insufficiency, moderate to severe   Chronic massive bilat LE lymphadema   Fall at  home- 3 falls this summer, sound orthostatic   Hypotension   Acute on chronic renal failure   Signed, Rosaria Ferries , PA-C 1:13 PM 09/22/2014  I have seen and evaluated the patient this PM along with Rosaria Ferries, PA. I agree with her findings, examination as well as impression recommendations.   Clinically improving, but still have a long way to go with diuresis. As Cr has plateaued - will scale back diuretic & consider converting to PO tomorrow PM (with PRN IV). Cannot determine if dry wgt is correct or not.  Target wgt for d/c should be < 330lb.  With significant drop in Hgb - transfused last PM with moderate increase.    WOCRN for leg ulcer-cellulitis -- has improved per pt report. Afebrile.  BP actually seems stable, but low -- may contribute to weakness (no room for afterload reduction).  HR stable on Amio @ current dose   PT working with her -- main concern is weakness & leg numbness (neuropathy).  Remains extremely deconditioned & debilitated. Will need lots of OP work.  Leonie Man, M.D., M.S. Interventional Cardiologist   Pager # [phone]

## 2014-09-22 NOTE — Progress Notes (Signed)
PROGRESS NOTE  [name] ZOX:[id number] DOB: 1950/10/01 DOA: 09/09/2014 PCP: Elby Showers, MD  64 y.o. female with history of fibromyalgia, hypothyroidism, solitary kidney, hypothyroidism, diabetes, nonischemic cardiomyopathy with ejection fraction of 35%, atrial fibrillation who was admitted on 9/10 w/ recurrent Lower extremity cellulitis. PCCM asked to see in consult on 9/11 for persistent hypotension and worsening renal failure. Was in ICU on pressors until 9/14. Transferred to telemetry on 9/15.  Patient is on cefazolin from 9/14.   Assessment/Plan: Septic shock due to Left lower leg cellulitis:  - abx cefazolin from 9/14 , in addition to IV vancomycin for 4 days , and ciprofloxacin and flagyl for 3 days.   - Keep lower extremity elevated.  - blood culture X 2 - 1/2 group B strep. We will complete a course of 14 days of antibiotics.  - Wound care following - pt/ot   . Type 2 diabetes mellitus:  - A1c 7.0  - lantus increased to 20 units.  - SSI while inpatient.  CBG (last 3)   Recent Labs  09/21/14 1623 09/21/14 2204 09/22/14 0551  GLUCAP 186* 223* 148*    . PAF (paroxysmal atrial fibrillation)  - Currently with a paced rhythm, monitor in telemetry.  - Coumadin dosed by pharmacy, continue amiodarone   . Combined systolic and diastolic congestive heart failure- EF 30-35% June 2014  -with volume overload, anasarca -being diuresed with IV lasix per Cards -creatinine stable, negative 15.5L -no ACE due to AKI -baseline dry weight unlcear  . Hypothyroidism, acquired, autoimmune: Recent TSH was 2.12 on 05/28/14.  - Continue levothyroxine   Gout: stable  -Continue Uloric  . CKD (chronic kidney disease) stage 4, GFR 15-29 ml/min: ccl slightly higher from baseline 2.5-3.00 to 3.38 on admission  - improved to 2 range now, monitor with diuresis   . PACEMAKER, PERMANENT   . Obesity hypoventilation syndrome  - CPAP QHS  . OBESITY, MORBID  Hypokalemia:  Replete as  needed.   Anemia: -due to CKD and acute illness and iron defi -baseline around 9, transfused 2units PRBC 9/22  Code Status: full Family Communication: patient Disposition Plan: SNF when stable.    Consultants:  PCCM  cards  Procedures:  none    HPI/Subjective: Feels better today, breathing ok Objective: Filed Vitals:   09/22/14 1007  BP: 85/57  Pulse: 100  Temp:   Resp:     Intake/Output Summary (Last 24 hours) at 09/22/14 1028 Last data filed at 09/22/14 0452  Gross per 24 hour  Intake    240 ml  Output   1200 ml  Net   -960 ml   Filed Weights   09/20/14 0426 09/21/14 0559 09/22/14 0451  Weight: 156.491 kg (345 lb) 158.26 kg (348 lb 14.4 oz) 156.128 kg (344 lb 3.2 oz)    Exam:   General:  Pleasant/A+Ox3, morbidly obese  Cardiovascular: irregular, s1s2.   Respiratory: clear, no wheezing. Good air entry bilateral.   Abdomen: obese, soft non tender non distended bowel sounds heard.   Musculoskeletal: 2-3+Edema, less redness on left leg   Skin: stage 3 sacral decubitus, .multiple ulcers over the lower extremities.   Data Reviewed: Basic Metabolic Panel:  Recent Labs Lab 09/17/14 0518  09/18/14 0545 09/19/14 0430 09/20/14 0535 09/21/14 1730 09/22/14 0500  NA 135*  --  135* 136* 132* 131* 134*  K 2.9*  < > 3.5* 4.1 4.3 4.6 4.9  CL 93*  --  94* 96 94* 94* 96  CO2 29  --  29 29 28 27 28   GLUCOSE 103*  --  117* 159* 171* 179* 139*  BUN 85*  --  76* 70* 65* 64* 64*  CREATININE 2.34*  --  2.14* 2.05* 1.90* 2.06* 2.10*  CALCIUM 8.0*  --  7.9* 7.9* 7.8* 8.0* 7.9*  MG 2.0  --   --   --   --   --   --   < > = values in this interval not displayed. Liver Function Tests:  Recent Labs Lab 09/22/14 0500  AST 22  ALT <5  ALKPHOS 194*  BILITOT 0.7  PROT 6.4  ALBUMIN 1.6*   No results found for this basename: LIPASE, AMYLASE,  in the last 168 hours No results found for this basename: AMMONIA,  in the last 168 hours CBC:  Recent Labs Lab  09/21/14 0525 09/21/14 1330 09/22/14 0500  WBC 8.0 10.0 7.6  HGB 7.3* 7.9* 8.5*  HCT 22.5* 24.8* 26.5*  MCV 97.8 100.4* 99.6  PLT 255 308 259   Cardiac Enzymes: No results found for this basename: CKTOTAL, CKMB, CKMBINDEX, TROPONINI,  in the last 168 hours BNP (last 3 results) No results found for this basename: PROBNP,  in the last 8760 hours CBG:  Recent Labs Lab 09/21/14 0659 09/21/14 1154 09/21/14 1623 09/21/14 2204 09/22/14 0551  GLUCAP 151* 183* 186* 223* 148*    Recent Results (from the past 240 hour(s))  CULTURE, BLOOD (ROUTINE X 2)     Status: None   Collection Time    09/15/14  1:21 PM      Result Value Ref Range Status   Specimen Description BLOOD RIGHT ANTECUBITAL   Final   Special Requests BOTTLES DRAWN AEROBIC ONLY 5CC   Final   Culture  Setup Time     Final   Value: 09/15/2014 18:00     Performed at [facility]   Culture     Final   Value: NO GROWTH 5 DAYS     Performed at [facility]   Report Status 09/21/2014 FINAL   Final  CULTURE, BLOOD (ROUTINE X 2)     Status: None   Collection Time    09/15/14  1:35 PM      Result Value Ref Range Status   Specimen Description BLOOD LEFT ANTECUBITAL   Final   Special Requests BOTTLES DRAWN AEROBIC AND ANAEROBIC 10CC   Final   Culture  Setup Time     Final   Value: 09/15/2014 18:02     Performed at [facility]     Final   Value: NO GROWTH 5 DAYS     Performed at [facility]   Report Status 09/21/2014 FINAL   Final  CLOSTRIDIUM DIFFICILE BY PCR     Status: None   Collection Time    09/20/14  9:04 AM      Result Value Ref Range Status   C difficile by pcr NEGATIVE  NEGATIVE Final     Studies: No results found.  Scheduled Meds: . sodium chloride   Intravenous Once  . amiodarone  400 mg Oral BID  . calcitRIOL  0.25 mcg Oral BID  . calcium-vitamin D  1 tablet Oral BID  .  ceFAZolin (ANCEF) IV  2 g Intravenous Q8H  . collagenase   Topical Daily  .  febuxostat  80 mg Oral Daily  . furosemide  80 mg Intravenous BID  . gabapentin  300 mg Oral BID  . insulin  aspart  0-15 Units Subcutaneous TID WC  . insulin aspart  0-5 Units Subcutaneous QHS  . insulin aspart  2 Units Subcutaneous TID WC  . insulin glargine  22 Units Subcutaneous QHS  . iron polysaccharides  150 mg Oral BID  . levothyroxine  125 mcg Oral QAC breakfast  . linagliptin  5 mg Oral Daily  . multivitamin with minerals  1 tablet Oral Daily  . mupirocin cream   Topical Daily  . potassium chloride  40 mEq Oral BID  . saccharomyces boulardii  250 mg Oral BID  . sodium chloride  3 mL Intravenous Q12H  . Warfarin - Pharmacist Dosing Inpatient   Does not apply q1800   Continuous Infusions:  Antibiotics Given (last 72 hours)   Date/Time Action Medication Dose Rate   09/19/14 1142 Given   ceFAZolin (ANCEF) IVPB 2 g/50 mL premix 2 g 100 mL/hr   09/19/14 2040 Given   ceFAZolin (ANCEF) IVPB 2 g/50 mL premix 2 g 100 mL/hr   09/20/14 1245 Given   ceFAZolin (ANCEF) IVPB 2 g/50 mL premix 2 g 100 mL/hr   09/20/14 1240 Given   ceFAZolin (ANCEF) IVPB 2 g/50 mL premix 2 g 100 mL/hr   09/20/14 2117 Given   ceFAZolin (ANCEF) IVPB 2 g/50 mL premix 2 g 100 mL/hr   09/21/14 8099 Given   ceFAZolin (ANCEF) IVPB 2 g/50 mL premix 2 g 100 mL/hr   09/21/14 1223 Given   ceFAZolin (ANCEF) IVPB 2 g/50 mL premix 2 g 100 mL/hr   09/21/14 2039 Given   ceFAZolin (ANCEF) IVPB 2 g/50 mL premix 2 g 100 mL/hr   09/22/14 0600 Given   ceFAZolin (ANCEF) IVPB 2 g/50 mL premix 2 g 100 mL/hr      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   Obesity hypoventilation syndrome-    Cardiomyopathy, nonischemic- EF 30-35% June 2014   Type 2 diabetes mellitus   Chronic combined systolic and diastolic CHF   Long term (current) use of anticoagulants   CKD (chronic kidney disease) stage 4, GFR 15-29 ml/min   PAF-  recurrent despite Amiodarone and multiple cardioversions   Mitral insufficiency, moderate to severe   Chronic massive bilat LE lymphadema   Fall at home- 3 falls this summer, sound orthostatic   Hypotension   Acute on chronic renal failure    Time spent: 25 min    [facility] Pager [phone] 7PM-7AM, please contact night-coverage at [email or web address], password [facility] 09/22/2014, 10:28 AM  LOS: 13 days

## 2014-09-22 NOTE — Progress Notes (Signed)
Physical Therapy Treatment Patient Details Name: [name] MRN: [id number] DOB: 1950/12/27 Today's Date: 09/22/2014    History of Present Illness 64 y.o. female  with history of fibromyalgia, hypothyroidism, solitary kidney, hypothyroidism, diabetes, nonischemic cardiomyopathy with ejection fraction of 35%, atrial fibrillation who presented to the emergency department with left-sided lower leg pain. She was admitted on 9/10 w/ recurrent Lower extremity cellulitis. PCCM asked to see in consult on 9/11 for persistent hypotension and worsening renal failure.  She had a previous admission for LE cellulitis in 07/2014 followed by [facility].  Pt had returned home with HHPT prior to this admission.     PT Comments    Pt admitted with above. Pt currently with functional limitations due to balance and endurance deficits as well as strength deficits.  Concentrated on UE and LE exercises today.    Pt will benefit from skilled PT to increase their independence and safety with mobility to allow discharge to the venue listed below.   Follow Up Recommendations  SNF;Supervision/Assistance - 24 hour     Equipment Recommendations  None recommended by PT    Recommendations for Other Services       Precautions / Restrictions Precautions Precautions: Fall Restrictions Weight Bearing Restrictions: No    Mobility  Bed Mobility                  Transfers                    Ambulation/Gait                 Stairs            Wheelchair Mobility    Modified Rankin (Stroke Patients Only)       Balance                                    Cognition Arousal/Alertness: Awake/alert Behavior During Therapy: Anxious Overall Cognitive Status: Within Functional Limits for tasks assessed                      Exercises General Exercises - Upper Extremity Shoulder Flexion: AROM;Strengthening;Both;10 reps;Supine;Bar weights/barbell Bar  Weights/Barbell (Shoulder Flexion): 1 lb Shoulder ABduction: AROM;Both;Strengthening;10 reps;Supine;Bar weights/barbell Bar Weights/Barbell (Shoulder Abduction): 1 lb Shoulder Horizontal ABduction: AROM;Strengthening;Both;10 reps;Supine;Bar weights/barbell Bar Weights/Barbell (Shoulder Horizontal Abduction): 1 lb Shoulder Horizontal ADduction: AROM;Both;10 reps;Supine;Bar weights/barbell Bar Weights/Barbell (Shoulder Horizontal Adduction): 1 lb Elbow Flexion: AROM;Both;10 reps;Supine;Bar weights/barbell Bar Weights/Barbell (Elbow Flexion): 1 lb Elbow Extension: AROM;Both;10 reps;Supine;Bar weights/barbell Bar Weights/Barbell (Elbow Extension): 1 lb General Exercises - Lower Extremity Ankle Circles/Pumps: AROM;Strengthening;Both;15 reps;Supine Quad Sets: AROM;Both;10 reps;Supine Gluteal Sets: AROM;Both;10 reps;Supine Short Arc Quad: Both;AAROM;10 reps;Supine (UE assist with leg lifter) Heel Slides: Both;10 reps;Supine;AAROM Hip ABduction/ADduction: AAROM;Both;10 reps;Supine    General Comments General comments (skin integrity, edema, etc.): positioned to protect skin and edema      Pertinent Vitals/Pain Pain Assessment: Faces Faces Pain Scale: Hurts even more Pain Location: bil LEs Pain Descriptors / Indicators: Aching;Pins and needles;Shooting;Numbness Pain Intervention(s): Limited activity within patient's tolerance;Monitored during session;Repositioned VSS    Home Living                      Prior Function            PT Goals (current goals can now be found in the care plan section) Progress towards PT goals: Progressing toward  goals    Frequency  Min 2X/week    PT Plan Current plan remains appropriate    Co-evaluation             End of Session Equipment Utilized During Treatment: Gait belt Activity Tolerance: Patient limited by fatigue;Patient limited by pain Patient left: in bed;with call bell/phone within reach;with family/visitor present      Time: 1350-1430 PT Time Calculation (min): 40 min  Charges:  $Therapeutic Exercise: 38-52 mins                    G Codes:      INGOLD,Amorah [place] 20-Oct-2014, 3:05 PM [facility] [phone] (pager)

## 2014-09-23 DIAGNOSIS — E119 Type 2 diabetes mellitus without complications: Secondary | ICD-10-CM

## 2014-09-23 LAB — BASIC METABOLIC PANEL
Anion gap: 12 (ref 5–15)
BUN: 64 mg/dL — AB (ref 6–23)
CHLORIDE: 94 meq/L — AB (ref 96–112)
CO2: 26 mEq/L (ref 19–32)
Calcium: 8.1 mg/dL — ABNORMAL LOW (ref 8.4–10.5)
Creatinine, Ser: 2.09 mg/dL — ABNORMAL HIGH (ref 0.50–1.10)
GFR calc Af Amer: 27 mL/min — ABNORMAL LOW (ref >90)
GFR calc non Af Amer: 24 mL/min — ABNORMAL LOW (ref >90)
GLUCOSE: 114 mg/dL — AB (ref 70–99)
Potassium: 4.5 mEq/L (ref 3.7–5.3)
Sodium: 132 mEq/L — ABNORMAL LOW (ref 137–147)

## 2014-09-23 LAB — TYPE AND SCREEN
ABO/RH(D): O POS
Antibody Screen: NEGATIVE
Unit division: 0
Unit division: 0

## 2014-09-23 LAB — PROTIME-INR
INR: 1.89 — ABNORMAL HIGH (ref 0.00–1.49)
Prothrombin Time: 21.7 seconds — ABNORMAL HIGH (ref 11.6–15.2)

## 2014-09-23 LAB — GLUCOSE, CAPILLARY
GLUCOSE-CAPILLARY: 128 mg/dL — AB (ref 70–99)
Glucose-Capillary: 131 mg/dL — ABNORMAL HIGH (ref 70–99)
Glucose-Capillary: 140 mg/dL — ABNORMAL HIGH (ref 70–99)
Glucose-Capillary: 123 mg/dL — ABNORMAL HIGH (ref 70–99)

## 2014-09-23 MED ORDER — WARFARIN SODIUM 2.5 MG PO TABS
2.5000 mg | ORAL_TABLET | Freq: Once | ORAL | Status: AC
  Administered 2014-09-23: 2.5 mg via ORAL
  Filled 2014-09-23: qty 1

## 2014-09-23 MED ORDER — FUROSEMIDE 10 MG/ML IJ SOLN
80.0000 mg | Freq: Two times a day (BID) | INTRAMUSCULAR | Status: DC
  Administered 2014-09-23 – 2014-09-24 (×2): 80 mg via INTRAVENOUS
  Filled 2014-09-23 (×4): qty 8

## 2014-09-23 MED ORDER — AMIODARONE HCL 200 MG PO TABS
400.0000 mg | ORAL_TABLET | Freq: Every day | ORAL | Status: DC
  Administered 2014-09-23 – 2014-10-26 (×33): 400 mg via ORAL
  Filled 2014-09-23 (×34): qty 2

## 2014-09-23 MED ORDER — FUROSEMIDE 10 MG/ML IJ SOLN: 80.0000 mg | Freq: Two times a day (BID) | INTRAMUSCULAR | Status: DC

## 2014-09-23 NOTE — Progress Notes (Signed)
ANTICOAGULATION CONSULT NOTE - Follow Up Consult  Pharmacy Consult:  Coumadin Indication: atrial fibrillation  Allergies  Allergen Reactions  . Ivp Dye [Iodinated Diagnostic Agents] Shortness Of Breath    Turn red, can't breathe  . Betadine [Povidone Iodine]   . Diphenhydramine Hcl Swelling    In hands and eyes  . Fish Allergy Nausea And Vomiting  . Iohexol      Desc: PT TURNS RED AND WHEEZING   . Penicillins Other (See Comments)    Resp arrest as child. Tolerates cephalosporins.  . Povidone-Iodine Other (See Comments)    Wheezing, turn red, can't breath, and blisters  . Promethazine Hcl Other (See Comments)    Low Blood Pressure  . Tape     Blisters, can use paper tape for short periods  . Clindamycin Hives and Rash    wheezing  . Iodine Rash  . Morphine Sulfate Nausea And Vomiting and Rash  . Primaxin [Imipenem] Rash    Patient Measurements: Height: 5' 6.93" (170 cm) Weight: 350 lb (158.759 kg) IBW/kg (Calculated) : 61.44  Vital Signs: Temp: 98.1 F (36.7 C) (09/24 0405) Temp src: Oral (09/24 0405) BP: 99/56 mmHg (09/24 0405) Pulse Rate: 99 (09/24 0405)  Labs:  Recent Labs  09/21/14 0525 09/21/14 1330 09/21/14 1730 09/22/14 0500 09/23/14 0535  HGB 7.3* 7.9*  --  8.5*  --   HCT 22.5* 24.8*  --  26.5*  --   PLT 255 308  --  259  --   LABPROT 29.0*  --   --  23.3* 21.7*  INR 2.74*  --   --  2.07* 1.89*  CREATININE  --   --  2.06* 2.10* 2.09*    Estimated Creatinine Clearance: 42 ml/min (by C-G formula based on Cr of 2.09).  Assessment: 87 YOF continues on Coumadin from PTA for history of Afib.  INR supra-therapeutic (3.4) on admit, then peaked at 5 likely due to drug-drug interactions with Cipro and Flagyl.  She then received Vitamin K 1mg  IV x 1 on 09/12/14.   She has required lower doses than her home regimen during this admission d/t high INRs. As a result of this, her INR dropped to slightly below goal today at 1.89. Anticipate she will be therapeutic  again tomorrow with slightly higher dose being given last evening. Hgb and plts with slight increases today. No bleeding noted.  Goal of Therapy:  INR 2-3 Monitor platelets by anticoagulation protocol: Yes  Plan:  1. Warfarin 2.5mg  po x1 tonight 2. Daily PT/INR 3. CBC in the morning 4. Anticipate patient will need to go home on a lower total weekly dose of warfarin than she was on previously 5. Follow for s/s bleeding  Tabathia Knoche D. Francesco Provencal, PharmD, BCPS Clinical Pharmacist Pager: [phone] 09/23/2014 12:13 PM

## 2014-09-23 NOTE — Progress Notes (Signed)
Patient Profile: 64 y.o. female with history of fibromyalgia, hypothyroidism, solitary kidney, DM, NICM w/ EF 35%, atrial fibrillation who was admitted on 9/10 w/ recurrent LE cellulitis. PCCM saw 9/11 for persistent hypotension/sepsis and worsening renal failure. Was in ICU on pressors until 9/14. Transferred to telemetry on 9/15. Patient is on cefazolin from 9/14.   Subjective: Pt complaint of intermittent "shakes". No chills. No fever. Reports this has been occuring for the last 2 weeks. Denies chest pain,dyspnea. Has occasional palpitations.   Objective: Vital signs in last 24 hours: Temp:  [98.1 F (36.7 C)-99.5 F (37.5 C)] 98.1 F (36.7 C) (09/24 0405) Pulse Rate:  [77-100] 99 (09/24 0405) Resp:  [18] 18 (09/24 0405) BP: (85-103)/(49-58) 99/56 mmHg (09/24 0405) SpO2:  [93 %-96 %] 96 % (09/24 0405) Weight:  [344 lb 2.2 oz (156.1 kg)] 344 lb 2.2 oz (156.1 kg) (09/24 0405) Last BM Date: 09/22/14  Intake/Output from previous day: 09/23 0701 - 09/24 0700 In: 44 [P.O.:490] Out: 1400 [Urine:1400] Intake/Output this shift:    Medications Current Facility-Administered Medications  Medication Dose Route Frequency Provider Last Rate Last Dose  . 0.9 %  sodium chloride infusion  250 mL Intravenous PRN Ivor Costa, MD 10 mL/hr at 09/18/14 0844 250 mL at 09/18/14 0844  . 0.9 %  sodium chloride infusion   Intravenous Once Hosie Poisson, MD      . albuterol (PROVENTIL) (2.5 MG/3ML) 0.083% nebulizer solution 2.5 mg  2.5 mg Nebulization Q6H PRN Ivor Costa, MD      . ALPRAZolam Duanne Moron) tablet 0.25 mg  0.25 mg Oral BID PRN Hosie Poisson, MD   0.25 mg at 09/23/14 0848  . amiodarone (PACERONE) tablet 400 mg  400 mg Oral Daily Leonie Man, MD      . calcitRIOL (ROCALTROL) capsule 0.25 mcg  0.25 mcg Oral BID Ivor Costa, MD   0.25 mcg at 09/22/14 2244  . calcium carbonate (TUMS - dosed in mg elemental calcium) chewable tablet 200 mg of elemental calcium  1 tablet Oral BID PRN Geradine Girt, DO    200 mg of elemental calcium at 09/14/14 1603  . calcium-vitamin D (OSCAL WITH D) 500-200 MG-UNIT per tablet 1 tablet  1 tablet Oral BID Ivor Costa, MD   1 tablet at 09/22/14 2243  . collagenase (SANTYL) ointment   Topical Daily Hosie Poisson, MD   1 application at 94/50/38 1000  . febuxostat (ULORIC) tablet 80 mg  80 mg Oral Daily Ivor Costa, MD   80 mg at 09/22/14 1040  . furosemide (LASIX) injection 80 mg  80 mg Intravenous Daily Rhonda G Barrett, PA-C      . gabapentin (NEURONTIN) capsule 300 mg  300 mg Oral BID Hosie Poisson, MD   300 mg at 09/22/14 2242  . HYDROcodone-acetaminophen (NORCO) 10-325 MG per tablet 1 tablet  1 tablet Oral Q4H PRN Hosie Poisson, MD   1 tablet at 09/22/14 1836  . hydrOXYzine (ATARAX/VISTARIL) tablet 25 mg  25 mg Oral Q4H PRN Ivor Costa, MD   25 mg at 09/18/14 2219  . insulin aspart (novoLOG) injection 0-15 Units  0-15 Units Subcutaneous TID WC Geradine Girt, DO   2 Units at 09/23/14 0645  . insulin aspart (novoLOG) injection 0-5 Units  0-5 Units Subcutaneous QHS Geradine Girt, DO   2 Units at 09/22/14 0048  . insulin aspart (novoLOG) injection 2 Units  2 Units Subcutaneous TID WC Hosie Poisson, MD   2 Units at 09/23/14 0646  .  insulin glargine (LANTUS) injection 22 Units  22 Units Subcutaneous QHS Hosie Poisson, MD   22 Units at 09/22/14 2245  . iron polysaccharides (NIFEREX) capsule 150 mg  150 mg Oral BID Ivor Costa, MD   150 mg at 09/22/14 2241  . levothyroxine (SYNTHROID, LEVOTHROID) tablet 125 mcg  125 mcg Oral QAC breakfast Ivor Costa, MD   125 mcg at 09/23/14 0537  . linagliptin (TRADJENTA) tablet 5 mg  5 mg Oral Daily Wilhelmina Mcardle, MD   5 mg at 09/22/14 1040  . multivitamin with minerals tablet 1 tablet  1 tablet Oral Daily Ivor Costa, MD   1 tablet at 09/22/14 1040  . mupirocin cream (BACTROBAN) 2 %   Topical Daily Hosie Poisson, MD      . nitroGLYCERIN (NITROSTAT) SL tablet 0.4 mg  0.4 mg Sublingual Q5 min PRN Ivor Costa, MD      . potassium chloride (K-DUR) CR  tablet 20 mEq  20 mEq Oral Daily Rhonda G Barrett, PA-C      . saccharomyces boulardii (FLORASTOR) capsule 250 mg  250 mg Oral BID Ivor Costa, MD   250 mg at 09/22/14 2243  . sodium chloride 0.9 % bolus 1,000 mL  1,000 mL Intravenous PRN Erick Colace, NP      . sodium chloride 0.9 % bolus 1,000 mL  1,000 mL Intravenous PRN Erick Colace, NP      . sodium chloride 0.9 % injection 10-40 mL  10-40 mL Intracatheter PRN Raylene Miyamoto, MD   20 mL at 09/21/14 1928  . sodium chloride 0.9 % injection 3 mL  3 mL Intravenous Q12H Ivor Costa, MD   3 mL at 09/22/14 1000  . sodium chloride 0.9 % injection 3 mL  3 mL Intravenous PRN Ivor Costa, MD      . Warfarin - Pharmacist Dosing Inpatient   Does not apply q1800 Saundra Shelling, [facility]        PE: General appearance: alert, cooperative, no distress and morbidly obese Neck: no JVD Lungs: clear to auscultation bilaterally Heart: irregularly irregular rhythm Extremities: 3+ bilateral edema Pulses: 2+ and symmetric Skin: warm and dry Neurologic: Grossly normal  Lab Results:   Recent Labs  09/21/14 0525 09/21/14 1330 09/22/14 0500  WBC 8.0 10.0 7.6  HGB 7.3* 7.9* 8.5*  HCT 22.5* 24.8* 26.5*  PLT 255 308 259   BMET  Recent Labs  09/21/14 1730 09/22/14 0500 09/23/14 0535  NA 131* 134* 132*  K 4.6 4.9 4.5  CL 94* 96 94*  CO2 27 28 26   GLUCOSE 179* 139* 114*  BUN 64* 64* 64*  CREATININE 2.06* 2.10* 2.09*  CALCIUM 8.0* 7.9* 8.1*   PT/INR  Recent Labs  09/21/14 0525 09/22/14 0500 09/23/14 0535  LABPROT 29.0* 23.3* 21.7*  INR 2.74* 2.07* 1.89*   Assessment/Plan    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 Acute on Chronic Systolic HF: still significantly volume overloaded. I/Os -16.5L total. ? If weight is accurate. Will attempt to obtain a standing weight today. Will hold zaroxolyn and will decrease IV Lasix to once daily. However, will reassess UOP this afternoon and if not diuresing well with 1 dose of Lasix, will consider giving a second dose this PM. Continue strict/I/Os, daily weights and low sodium diet. Monitor BP and electrolytes closely.  2. Persistent A-fib: rate controlled. Continue amiodarone, but reduce to once daily dosing.  AC with coumadin.   3. Chronic oral anticoagulation: on Warfarin for Afib. INR is subtherapeutic today at 1.89. Goal is 2-3 but has been on hold due to anemia. Restart once cleared by IM to resume.   3. Cellulitis of the LEE: continue antibiotics per IM.   4. Anemia: s/p transfusion. Hgb yesterday was 8.5. CBC not ordered today. IM managing.   5. CKD: Scr today is 2.09 which near her baseline. Changing IV Lasix to once daily.   6. Tremors: patient notes symptoms have been intermittent x 2 weeks and appear to be worsening. ? If due to electrolyte disturbances.  K is WNL however patient is hyponatremic with sodium level at 132. Calcium also low at 8.1. Will defer recommendations to MD.     LOS: 14 days    Brittainy M. Tram, [place] 09/23/2014 9:42 AM   I have seen and evaluated the patient this AM along with Ellen Henri, PA. I agree with her findings, examination as well as impression recommendations.  Had not been mentioned to me before. This may very well be related to electrolyte imbalance, however her left was not that far out of balance. Goal not this  could be related to her infection, however that also seems to be improving.  Interestingly, her record her weight today is 350 pounds in spite still having a liter out yesterday. I have written order to try to get standing weight because the simply does not make sense. Using weights to know when she is close to her baseline level volume. Unfortunately she is quite unstable and comes to standing and makes this a quite difficult to achieve.    Hemoglobin was not checked today, but had modest increased after transfusion yesterday. Renal function is stabilized. Unfortunately, if her urine output does not continue to be consistent with at 750 mL-1 L neck out/day, we will likely need to increase back to twice a day IV Lasix. The hope would be to convert to her home oral Demadex plus intermittent Zaroxolyn if not tomorrow, over weekend -- due to poor GI absorption, continue with IV as long as she is getting IV antibiotics.  Overall her cellulitis appears to be improved.  She is quite deconditioned and will definitely require skilled nursing facility on discharge. Please see Dr. Victorino December last note for details on her preference.  Leonie Man, M.D., M.S. Interventional Cardiologist   Pager # [phone]

## 2014-09-23 NOTE — Clinical Social Work Note (Addendum)
CSW spoke with patient and her sister Kieth Brightly at bedside on 09/22/14. CSW provided patient with update os SNF search. 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 CSW reassured her we would continue to work towards her preferences- patient's sister also concerned a she lives in [place], [place] and wont be close by-  CSW will follow up to [place] on offers as rec'd.  Eduard Clos, MSW, [place]

## 2014-09-23 NOTE — Clinical Social Work Note (Signed)
Clinical Social Worker continuing to follow patient and family for support and discharge planning needs.  Covering CSW expanded SNF search to further facilities in the [facility] area - no documentation of communication with patient at this time.  This CSW to readdress placement needs and concerns with patient tomorrow.  CSW remains available for support and to facilitate patient discharge needs once medically ready.  Barbette Or, [place]

## 2014-09-23 NOTE — Progress Notes (Signed)
PROGRESS NOTE  [name] YDX:[id number] DOB: 09-20-1950 DOA: 09/09/2014 PCP: Elby Showers, MD  64 y.o. female with history of fibromyalgia, hypothyroidism, solitary kidney, hypothyroidism, diabetes, nonischemic cardiomyopathy with ejection fraction of 35%, atrial fibrillation who was admitted on 9/10 w/ recurrent Lower extremity cellulitis. PCCM asked to see in consult on 9/11 for persistent hypotension and worsening renal failure. Was in ICU on pressors until 9/14. Transferred to telemetry on 9/15.  Patient is on cefazolin from 9/14.   Assessment/Plan: Septic shock due to Left lower leg cellulitis:  - abx cefazolin from 9/14 , in addition to IV vancomycin for 4 days , and ciprofloxacin and flagyl for 3 days.   - Keep lower extremity elevated.  - blood culture X 2 - 1/2 group B strep,  completed 14 days of antibiotics, between Vanc and ancef  - Wound care following  . Type 2 diabetes mellitus:  - A1c 7.0  - lantus increased to 22 units.  - SSI while inpatient   . PAF (paroxysmal atrial fibrillation)  - Currently with a paced rhythm, monitor in telemetry.  - Coumadin dosed by pharmacy, continue amiodarone   . Combined systolic and diastolic congestive heart failure- EF 30-35% June 2014  -with volume overload, anasarca -being diuresed with IV lasix per Cards -creatinine stable, negative 16.2L -no ACE due to AKI -baseline dry weight unlcear  . Hypothyroidism, acquired, autoimmune: Recent TSH was 2.12 on 05/28/14.  - Continue levothyroxine     Gout: stable  -Continue Uloric  . CKD (chronic kidney disease) stage 4, GFR 15-29 ml/min: ccl slightly higher from baseline 2.5-3.00 to 3.38 on admission  - improved to 2 range now, monitor with diuresis   . PACEMAKER, PERMANENT   . Obesity hypoventilation syndrome  - CPAP QHS  . OBESITY, MORBID    Hypokalemia:  - Replete as needed.     Anemia: -due to CKD and acute illness and iron defi -baseline around 9, transfused 1unit  PRBC 9/22  Code Status: full Family Communication: patient Disposition Plan: SNF when stable.    Consultants:  PCCM  cards  Procedures:  none   HPI/Subjective: Feels better today, breathing ok  Objective: Filed Vitals:   09/23/14 0405  BP: 99/56  Pulse: 99  Temp: 98.1 F (36.7 C)  Resp: 18    Intake/Output Summary (Last 24 hours) at 09/23/14 1343 Last data filed at 09/23/14 1200  Gross per 24 hour  Intake    550 ml  Output    650 ml  Net   -100 ml   Filed Weights   09/22/14 0451 09/23/14 0405 09/23/14 1108  Weight: 156.128 kg (344 lb 3.2 oz) 156.1 kg (344 lb 2.2 oz) 158.759 kg (350 lb)    Exam:   General:  Pleasant/A+Ox3, morbidly obese  Cardiovascular: irregular, s1s2.   Respiratory: clear, no wheezing. Good air entry bilateral.   Abdomen: obese, soft non tender non distended bowel sounds heard.   Musculoskeletal: 2-3+Edema, less redness on left leg   Skin: stage 3 sacral decubitus, .multiple ulcers over the lower extremities.   Data Reviewed: Basic Metabolic Panel:  Recent Labs Lab 09/17/14 0518  09/19/14 0430 09/20/14 0535 09/21/14 1730 09/22/14 0500 09/23/14 0535  NA 135*  < > 136* 132* 131* 134* 132*  K 2.9*  < > 4.1 4.3 4.6 4.9 4.5  CL 93*  < > 96 94* 94* 96 94*  CO2 29  < > 29 28 27 28 26   GLUCOSE 103*  < > 159*  171* 179* 139* 114*  BUN 85*  < > 70* 65* 64* 64* 64*  CREATININE 2.34*  < > 2.05* 1.90* 2.06* 2.10* 2.09*  CALCIUM 8.0*  < > 7.9* 7.8* 8.0* 7.9* 8.1*  MG 2.0  --   --   --   --   --   --   < > = values in this interval not displayed. Liver Function Tests:  Recent Labs Lab 09/22/14 0500  AST 22  ALT <5  ALKPHOS 194*  BILITOT 0.7  PROT 6.4  ALBUMIN 1.6*   No results found for this basename: LIPASE, AMYLASE,  in the last 168 hours No results found for this basename: AMMONIA,  in the last 168 hours CBC:  Recent Labs Lab 09/21/14 0525 09/21/14 1330 09/22/14 0500  WBC 8.0 10.0 7.6  HGB 7.3* 7.9* 8.5*  HCT  22.5* 24.8* 26.5*  MCV 97.8 100.4* 99.6  PLT 255 308 259   Cardiac Enzymes: No results found for this basename: CKTOTAL, CKMB, CKMBINDEX, TROPONINI,  in the last 168 hours BNP (last 3 results) No results found for this basename: PROBNP,  in the last 8760 hours CBG:  Recent Labs Lab 09/22/14 1223 09/22/14 1638 09/22/14 2055 09/23/14 0620 09/23/14 1112  GLUCAP 154* 157* 156* 131* 140*    Recent Results (from the past 240 hour(s))  CULTURE, BLOOD (ROUTINE X 2)     Status: None   Collection Time    09/15/14  1:21 PM      Result Value Ref Range Status   Specimen Description BLOOD RIGHT ANTECUBITAL   Final   Special Requests BOTTLES DRAWN AEROBIC ONLY 5CC   Final   Culture  Setup Time     Final   Value: 09/15/2014 18:00     Performed at [facility]   Culture     Final   Value: NO GROWTH 5 DAYS     Performed at [facility]   Report Status 09/21/2014 FINAL   Final  CULTURE, BLOOD (ROUTINE X 2)     Status: None   Collection Time    09/15/14  1:35 PM      Result Value Ref Range Status   Specimen Description BLOOD LEFT ANTECUBITAL   Final   Special Requests BOTTLES DRAWN AEROBIC AND ANAEROBIC 10CC   Final   Culture  Setup Time     Final   Value: 09/15/2014 18:02     Performed at [place]     Final   Value: NO GROWTH 5 DAYS     Performed at [facility]   Report Status 09/21/2014 FINAL   Final  CLOSTRIDIUM DIFFICILE BY PCR     Status: None   Collection Time    09/20/14  9:04 AM      Result Value Ref Range Status   C difficile by pcr NEGATIVE  NEGATIVE Final     Studies: No results found.  Scheduled Meds: . sodium chloride   Intravenous Once  . amiodarone  400 mg Oral Daily  . calcitRIOL  0.25 mcg Oral BID  . calcium-vitamin D  1 tablet Oral BID  . collagenase   Topical Daily  . febuxostat  80 mg Oral Daily  . furosemide  80 mg Intravenous Daily  . gabapentin  300 mg Oral BID  . insulin aspart  0-15 Units  Subcutaneous TID WC  . insulin aspart  0-5 Units Subcutaneous QHS  . insulin aspart  2 Units  Subcutaneous TID WC  . insulin glargine  22 Units Subcutaneous QHS  . iron polysaccharides  150 mg Oral BID  . levothyroxine  125 mcg Oral QAC breakfast  . linagliptin  5 mg Oral Daily  . multivitamin with minerals  1 tablet Oral Daily  . mupirocin cream   Topical Daily  . potassium chloride  20 mEq Oral Daily  . saccharomyces boulardii  250 mg Oral BID  . sodium chloride  3 mL Intravenous Q12H  . warfarin  2.5 mg Oral ONCE-1800  . Warfarin - Pharmacist Dosing Inpatient   Does not apply q1800   Continuous Infusions:  Antibiotics Given (last 72 hours)   Date/Time Action Medication Dose Rate   09/20/14 2117 Given   ceFAZolin (ANCEF) IVPB 2 g/50 mL premix 2 g 100 mL/hr   09/21/14 0506 Given   ceFAZolin (ANCEF) IVPB 2 g/50 mL premix 2 g 100 mL/hr   09/21/14 1223 Given   ceFAZolin (ANCEF) IVPB 2 g/50 mL premix 2 g 100 mL/hr   09/21/14 2039 Given   ceFAZolin (ANCEF) IVPB 2 g/50 mL premix 2 g 100 mL/hr   09/22/14 0600 Given   ceFAZolin (ANCEF) IVPB 2 g/50 mL premix 2 g 100 mL/hr   09/22/14 1158 Given   ceFAZolin (ANCEF) IVPB 2 g/50 mL premix 2 g 100 mL/hr   09/22/14 2002 Given   ceFAZolin (ANCEF) IVPB 2 g/50 mL premix 2 g 100 mL/hr      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   Obesity hypoventilation syndrome-    Cardiomyopathy, nonischemic- EF 30-35% June 2014   Type 2 diabetes mellitus   Chronic combined systolic and diastolic CHF   Long term (current) use of anticoagulants   CKD (chronic kidney disease) stage 4, GFR 15-29 ml/min   PAF- recurrent despite Amiodarone and multiple cardioversions   Mitral insufficiency, moderate to severe   Chronic massive bilat LE lymphadema   Fall at home- 3 falls this summer, sound orthostatic   Hypotension   Acute on chronic renal  failure    Time spent: 25 min    [facility] Pager [phone] 7PM-7AM, please contact night-coverage at [email or web address], password [facility] 09/23/2014, 1:43 PM  LOS: 14 days

## 2014-09-23 NOTE — Progress Notes (Signed)
Occupational Therapy Treatment Patient Details Name: [name] MRN: [id number] DOB: 1950/09/27 Today's Date: 09/23/2014    History of present illness 64 y.o. female  with history of fibromyalgia, hypothyroidism, solitary kidney, hypothyroidism, diabetes, nonischemic cardiomyopathy with ejection fraction of 35%, atrial fibrillation who presented to the emergency department with left-sided lower leg pain. She was admitted on 9/10 w/ recurrent Lower extremity cellulitis. PCCM asked to see in consult on 9/11 for persistent hypotension and worsening renal failure.  She had a previous admission for LE cellulitis in 07/2014 followed by [facility].  Pt had returned home with HHPT prior to this admission.    OT comments  Pt making very slow gains with functional goals. Pt continues to have edema and pain in B LEs and reddened open sores on LEs. Pt fearful and fatigues easily. Pt to continue with acute OT services to increase level of function and safety  Follow Up Recommendations  SNF;Supervision/Assistance - 24 hour    Equipment Recommendations  None recommended by OT    Recommendations for Other Services      Precautions / Restrictions Precautions Precautions: Fall Restrictions Weight Bearing Restrictions: No       Mobility Bed Mobility Overal bed mobility: Needs Assistance;+2 for physical assistance Bed Mobility: Rolling Rolling: Max assist;+2 for physical assistance   Supine to sit: +2 for physical assistance;Max assist Sit to supine: +2 for physical assistance;Total assist   General bed mobility comments: +2-3 physical assist required. Pt has leg lifter to assist with LEs to get to EOB.  Needed assist for elevation of trunk as well.  Pt used bedrails for rolling.  Pt had BM therefore rolled to clean pt multiple rolls and changed all pads.    Transfers Overall transfer level: Needs assistance   Transfers: Sit to/from Stand Sit to Stand: +2 physical assistance;Max assist          General transfer comment: +3 for physical assist for sit - stand from EOB with support from locked chair with handles. Pt attempted to stand x 3 and was able to clear her buttocks from mattress x 2    Balance Overall balance assessment: Needs assistance Sitting-balance support: Single extremity supported;No upper extremity supported;Feet supported Sitting balance-Leahy Scale: Fair Sitting balance - Comments: pt able to wash and dry face seated at EOB                           ADL       Grooming: Set up;Sitting                                 General ADL Comments: pt sitting EOB this session      Vision  wears reading glasses                   Perception Perception Perception Tested?: No   Praxis Praxis Praxis tested?: Not tested    Cognition   Behavior During Therapy: [facility] for tasks assessed/performed;Anxious Overall Cognitive Status: Within Functional Limits for tasks assessed                                      Exercises General Exercises - Upper Extremity Shoulder Flexion: AROM;Strengthening;Both;10 reps;Supine;Bar weights/barbell Bar Weights/Barbell (Shoulder Flexion): 1 lb Elbow Flexion: AROM;Both;10 reps;Supine;Bar weights/barbell Elbow Extension: AROM;Both;10 reps;Supine;[employer]  weights/barbell Bar Weights/Barbell (Elbow Extension): 1 lb          General Comments  pt cooperative, however required max enocuragement    Pertinent Vitals/ Pain       Pain Assessment: Faces Pain Score: 6  Pain Location: LEs Pain Descriptors / Indicators: Aching;Pins and needles;Numbness Pain Intervention(s): Limited activity within patient's tolerance;Monitored during session;Repositioned                                                          Frequency Min 2X/week     Progress Toward Goals  OT Goals(current goals can now be found in the care plan section)  Progress towards OT goals: OT to  reassess next treatment     Plan Discharge plan remains appropriate    Co-evaluation    PT/OT/SLP Co-Evaluation/Treatment: Yes Reason for Co-Treatment: For patient/therapist safety   OT goals addressed during session: ADL's and self-care;Strengthening/ROM      End of Session Equipment Utilized During Treatment: Gait belt   Activity Tolerance Patient limited by fatigue;Patient limited by pain   Patient Left in bed;with call bell/phone within reach;with family/visitor present   Nurse Communication  Nursing present during session         Time: 2297-9892 OT Time Calculation (min): 38 min  Charges: OT General Charges $OT Visit: 1 Procedure OT Treatments $Therapeutic Activity: 8-22 mins $Therapeutic Exercise: 8-22 mins  Britt Bottom 09/23/2014, 12:47 PM

## 2014-09-23 NOTE — Progress Notes (Signed)
Physical Therapy Treatment Patient Details Name: [name] MRN: [id number] DOB: 01/25/1950 Today's Date: 09/23/2014    History of Present Illness 64 y.o. female  with history of fibromyalgia, hypothyroidism, solitary kidney, hypothyroidism, diabetes, nonischemic cardiomyopathy with ejection fraction of 35%, atrial fibrillation who presented to the emergency department with left-sided lower leg pain. She was admitted on 9/10 w/ recurrent Lower extremity cellulitis. PCCM asked to see in consult on 9/11 for persistent hypotension and worsening renal failure.  She had a previous admission for LE cellulitis in 07/2014 followed by [facility].  Pt had returned home with HHPT prior to this admission.     PT Comments    Pt admitted with above. Pt currently with functional limitations due to continued balance, strength and endurance deficits.  Pt gaining trust in PT/OT and was able to attempt standing today.  Still cannot feel her LEs which limits her even more.   Pt will benefit from skilled PT to increase their independence and safety with mobility to allow discharge to the venue listed below.   Follow Up Recommendations  SNF;Supervision/Assistance - 24 hour     Equipment Recommendations  None recommended by PT    Recommendations for Other Services       Precautions / Restrictions Precautions Precautions: Fall Restrictions Weight Bearing Restrictions: No    Mobility  Bed Mobility Overal bed mobility: Needs Assistance;+2 for physical assistance Bed Mobility: Rolling Rolling: Max assist;+2 for physical assistance   Supine to sit: +2 for physical assistance;Max assist Sit to supine: +2 for physical assistance;Total assist   General bed mobility comments: +2-3 physical assist required. Pt has leg lifter to assist with LEs to get to EOB.  Needed assist for elevation of trunk as well.  Pt used bedrails for rolling.  Pt had BM therefore rolled to clean pt multiple rolls and changed all pads.     Transfers Overall transfer level: Needs assistance Equipment used:  (Wellington chair in front of pt and pt attempted to use handrails ) Transfers: Sit to/from Stand Sit to Stand: +2 physical assistance;Max assist         General transfer comment: +3 for physical assist for sit - stand from EOB with support from locked chair with handles. Pt attempted to stand x 3 and was able to clear her buttocks from mattress x 2  Ambulation/Gait                 Stairs            Wheelchair Mobility    Modified Rankin (Stroke Patients Only)       Balance Overall balance assessment: Needs assistance;History of Falls Sitting-balance support: Single extremity supported;Feet supported Sitting balance-Leahy Scale: Fair Sitting balance - Comments: pt able to wash and dry face seated at EOB.  Caution used as pt leans at times and monitoring for pt to not slide off bed.         Standing balance comment: Unable to achieve standing even though attempted.                     Cognition Arousal/Alertness: Awake/alert Behavior During Therapy: WFL for tasks assessed/performed;Anxious Overall Cognitive Status: Within Functional Limits for tasks assessed                      Exercises General Exercises - Upper Extremity Shoulder Flexion: AROM;Strengthening;Both;10 reps;Supine;Bar weights/barbell Bar Weights/Barbell (Shoulder Flexion): 1 lb Elbow Flexion: AROM;Both;10 reps;Supine;Bar weights/barbell Bar Weights/Barbell (Elbow  Flexion): 1 lb Elbow Extension: AROM;Both;10 reps;Supine;Bar weights/barbell Bar Weights/Barbell (Elbow Extension): 1 lb    General Comments General comments (skin integrity, edema, etc.): Positioned to protect skin and edema.  Noted multiple wounds on buttock and LEs and made nursing aware.  They were already aware of most of the wounds.  Nursing was going to call MD due to left upper thigh more reddened today.        Pertinent Vitals/Pain Pain  Assessment: Faces Pain Score: 6  Faces Pain Scale: Hurts even more Pain Location: LES Pain Descriptors / Indicators: Aching;Numbness;Pins and needles Pain Intervention(s): Limited activity within patient's tolerance;Monitored during session;Repositioned;Premedicated before session VSS    Home Living                      Prior Function            PT Goals (current goals can now be found in the care plan section) Progress towards PT goals: Progressing toward goals    Frequency  Min 2X/week    PT Plan Current plan remains appropriate    Co-evaluation PT/OT/SLP Co-Evaluation/Treatment: Yes Reason for Co-Treatment: For patient/therapist safety PT goals addressed during session: Mobility/safety with mobility OT goals addressed during session: ADL's and self-care;Strengthening/ROM     End of Session Equipment Utilized During Treatment: Gait belt Activity Tolerance: Patient limited by fatigue;Patient limited by pain Patient left: in bed;with call bell/phone within reach     Time: 1027-1112 PT Time Calculation (min): 45 min  Charges:  $Therapeutic Activity: 8-22 mins                    G Codes:      INGOLD,Marshia Tropea 18-Oct-2014, 1:56 PM [facility] [phone] (pager)

## 2014-09-23 NOTE — Progress Notes (Signed)
Pt refuses CPap. Will monitor

## 2014-09-24 LAB — BASIC METABOLIC PANEL
Anion gap: 10 (ref 5–15)
BUN: 66 mg/dL — ABNORMAL HIGH (ref 6–23)
CO2: 27 meq/L (ref 19–32)
Calcium: 8.2 mg/dL — ABNORMAL LOW (ref 8.4–10.5)
Chloride: 96 mEq/L (ref 96–112)
Creatinine, Ser: 2.2 mg/dL — ABNORMAL HIGH (ref 0.50–1.10)
GFR calc non Af Amer: 22 mL/min — ABNORMAL LOW (ref >90)
GFR, EST AFRICAN AMERICAN: 26 mL/min — AB (ref >90)
Glucose, Bld: 131 mg/dL — ABNORMAL HIGH (ref 70–99)
POTASSIUM: 4.4 meq/L (ref 3.7–5.3)
Sodium: 133 mEq/L — ABNORMAL LOW (ref 137–147)

## 2014-09-24 LAB — GLUCOSE, CAPILLARY
Glucose-Capillary: 120 mg/dL — ABNORMAL HIGH (ref 70–99)
Glucose-Capillary: 141 mg/dL — ABNORMAL HIGH (ref 70–99)
Glucose-Capillary: 194 mg/dL — ABNORMAL HIGH (ref 70–99)
Glucose-Capillary: 182 mg/dL — ABNORMAL HIGH (ref 70–99)

## 2014-09-24 LAB — CBC
HCT: 26.4 % — ABNORMAL LOW (ref 36.0–46.0)
Hemoglobin: 8.5 g/dL — ABNORMAL LOW (ref 12.0–15.0)
MCH: 31.4 pg (ref 26.0–34.0)
MCHC: 32.2 g/dL (ref 30.0–36.0)
MCV: 97.4 fL (ref 78.0–100.0)
Platelets: 266 10*3/uL (ref 150–400)
RBC: 2.71 MIL/uL — AB (ref 3.87–5.11)
RDW: 18.1 % — ABNORMAL HIGH (ref 11.5–15.5)
WBC: 5.6 10*3/uL (ref 4.0–10.5)

## 2014-09-24 LAB — PROTIME-INR
INR: 1.65 — AB (ref 0.00–1.49)
PROTHROMBIN TIME: 19.5 s — AB (ref 11.6–15.2)

## 2014-09-24 MED ORDER — TORSEMIDE 100 MG PO TABS
100.0000 mg | ORAL_TABLET | Freq: Two times a day (BID) | ORAL | Status: DC
  Administered 2014-09-25 – 2014-09-27 (×5): 100 mg via ORAL
  Filled 2014-09-24 (×7): qty 1

## 2014-09-24 MED ORDER — WARFARIN SODIUM 2.5 MG PO TABS
2.5000 mg | ORAL_TABLET | Freq: Once | ORAL | Status: AC
  Administered 2014-09-24: 2.5 mg via ORAL
  Filled 2014-09-24: qty 1

## 2014-09-24 MED ORDER — METOLAZONE 2.5 MG PO TABS
2.5000 mg | ORAL_TABLET | ORAL | Status: DC
  Administered 2014-09-24 – 2014-10-16 (×12): 2.5 mg via ORAL
  Filled 2014-09-24 (×13): qty 1

## 2014-09-24 NOTE — Progress Notes (Signed)
PROGRESS NOTE  [name] WLN:[id number] DOB: January 24, 1950 DOA: 09/09/2014 PCP: Elby Showers, MD  64 y.o. female with history of fibromyalgia, hypothyroidism, solitary kidney, hypothyroidism, diabetes, nonischemic cardiomyopathy with ejection fraction of 35%, atrial fibrillation who was admitted on 9/10 w/ recurrent Lower extremity cellulitis. PCCM asked to see in consult on 9/11 for persistent hypotension and worsening renal failure. Was in ICU on pressors until 9/14. Transferred to telemetry on 9/15.  Patient is on cefazolin from 9/14.   Assessment/Plan: Septic shock due to Left lower leg cellulitis:  - abx cefazolin from 9/14 , in addition to IV vancomycin for 4 days , and ciprofloxacin and flagyl for 3 days.   - Keep lower extremity elevated.  - blood culture X 2 - 1/2 group B strep,  completed 14 days of antibiotics, between Vanc and ancef  - Ancef stopped 9/24 - Wound care following  .Acute on  Combined systolic and diastolic congestive heart failure- EF 30-35% June 2014  -with volume overload, anasarca, third spacing with severe hypoalbuminemia -being diuresed with IV lasix per Cards -creatinine stable, negative 16.2L -no ACE due to AKI -baseline dry weight unclear around 330lbs, still has some ways to have, unable to get a accurate weight everyday  RUE swelling -dopplers 9/22 negative, due to third spacing  . Type 2 diabetes mellitus:  - A1c 7.0  - lantus increased to 22 units.  - SSI while inpatient   . PAF (paroxysmal atrial fibrillation)  - Currently with a paced rhythm - Coumadin dosed by pharmacy, continue amiodarone   . Hypothyroidism, acquired, autoimmune: Recent TSH was 2.12 on 05/28/14.  - Continue levothyroxine     Gout: stable  -Continue Uloric  . CKD (chronic kidney disease) stage 4, GFR 15-29 ml/min: ccl slightly higher from baseline 2.5-3.00 to 3.38 on admission  - improved to 2 range now, monitor with diuresis   . PACEMAKER, PERMANENT   . Obesity  hypoventilation syndrome  - CPAP QHS  . OBESITY, MORBID    Hypokalemia:  - Replete as needed.     Anemia: -due to CKD and acute illness and iron defi -baseline around 9, transfused 1unit PRBC 9/22  Code Status: full Family Communication: patient Disposition Plan: SNF when stable, hopefully early next week   Consultants:  PCCM  cards  Procedures:  none   HPI/Subjective: Feels better today, breathing ok  Objective: Filed Vitals:   09/24/14 1357  BP: 103/54  Pulse: 96  Temp: 98.2 F (36.8 C)  Resp: 19    Intake/Output Summary (Last 24 hours) at 09/24/14 1528 Last data filed at 09/24/14 1300  Gross per 24 hour  Intake    735 ml  Output    700 ml  Net     35 ml   Filed Weights   09/23/14 1108 09/23/14 1830 09/24/14 0538  Weight: 158.759 kg (350 lb) 155.765 kg (343 lb 6.4 oz) 156.264 kg (344 lb 8 oz)    Exam:   General:  Pleasant/A+Ox3, morbidly obese  Cardiovascular: irregular, s1s2.   Respiratory: clear, no wheezing. Good air entry bilateral.   Abdomen: obese, soft non tender non distended bowel sounds heard.   Musculoskeletal: 2-3+Edema, less redness on left leg   Skin: stage 3 sacral decubitus, .multiple ulcers over the lower extremities.   Data Reviewed: Basic Metabolic Panel:  Recent Labs Lab 09/20/14 0535 09/21/14 1730 09/22/14 0500 09/23/14 0535 09/24/14 0645  NA 132* 131* 134* 132* 133*  K 4.3 4.6 4.9 4.5 4.4  CL 94*  94* 96 94* 96  CO2 28 27 28 26 27   GLUCOSE 171* 179* 139* 114* 131*  BUN 65* 64* 64* 64* 66*  CREATININE 1.90* 2.06* 2.10* 2.09* 2.20*  CALCIUM 7.8* 8.0* 7.9* 8.1* 8.2*   Liver Function Tests:  Recent Labs Lab 09/22/14 0500  AST 22  ALT <5  ALKPHOS 194*  BILITOT 0.7  PROT 6.4  ALBUMIN 1.6*   No results found for this basename: LIPASE, AMYLASE,  in the last 168 hours No results found for this basename: AMMONIA,  in the last 168 hours CBC:  Recent Labs Lab 09/21/14 0525 09/21/14 1330 09/22/14 0500  09/24/14 0645  WBC 8.0 10.0 7.6 5.6  HGB 7.3* 7.9* 8.5* 8.5*  HCT 22.5* 24.8* 26.5* 26.4*  MCV 97.8 100.4* 99.6 97.4  PLT 255 308 259 266   Cardiac Enzymes: No results found for this basename: CKTOTAL, CKMB, CKMBINDEX, TROPONINI,  in the last 168 hours BNP (last 3 results) No results found for this basename: PROBNP,  in the last 8760 hours CBG:  Recent Labs Lab 09/23/14 1112 09/23/14 1617 09/23/14 2106 09/24/14 0700 09/24/14 1102  GLUCAP 140* 123* 128* 120* 141*    Recent Results (from the past 240 hour(s))  CULTURE, BLOOD (ROUTINE X 2)     Status: None   Collection Time    09/15/14  1:21 PM      Result Value Ref Range Status   Specimen Description BLOOD RIGHT ANTECUBITAL   Final   Special Requests BOTTLES DRAWN AEROBIC ONLY 5CC   Final   Culture  Setup Time     Final   Value: 09/15/2014 18:00     Performed at [facility]   Culture     Final   Value: NO GROWTH 5 DAYS     Performed at [facility]   Report Status 09/21/2014 FINAL   Final  CULTURE, BLOOD (ROUTINE X 2)     Status: None   Collection Time    09/15/14  1:35 PM      Result Value Ref Range Status   Specimen Description BLOOD LEFT ANTECUBITAL   Final   Special Requests BOTTLES DRAWN AEROBIC AND ANAEROBIC 10CC   Final   Culture  Setup Time     Final   Value: 09/15/2014 18:02     Performed at [place]     Final   Value: NO GROWTH 5 DAYS     Performed at [facility]   Report Status 09/21/2014 FINAL   Final  CLOSTRIDIUM DIFFICILE BY PCR     Status: None   Collection Time    09/20/14  9:04 AM      Result Value Ref Range Status   C difficile by pcr NEGATIVE  NEGATIVE Final     Studies: No results found.  Scheduled Meds: . sodium chloride   Intravenous Once  . amiodarone  400 mg Oral Daily  . calcitRIOL  0.25 mcg Oral BID  . calcium-vitamin D  1 tablet Oral BID  . collagenase   Topical Daily  . febuxostat  80 mg Oral Daily  . gabapentin  300 mg Oral  BID  . insulin aspart  0-15 Units Subcutaneous TID WC  . insulin aspart  0-5 Units Subcutaneous QHS  . insulin aspart  2 Units Subcutaneous TID WC  . insulin glargine  22 Units Subcutaneous QHS  . iron polysaccharides  150 mg Oral BID  . levothyroxine  125 mcg Oral  QAC breakfast  . linagliptin  5 mg Oral Daily  . metolazone  2.5 mg Oral QODAY  . multivitamin with minerals  1 tablet Oral Daily  . mupirocin cream   Topical Daily  . potassium chloride  20 mEq Oral Daily  . saccharomyces boulardii  250 mg Oral BID  . sodium chloride  3 mL Intravenous Q12H  . [START ON 09/25/2014] torsemide  100 mg Oral BID  . warfarin  2.5 mg Oral ONCE-1800  . 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   Obesity hypoventilation syndrome-    Cardiomyopathy, nonischemic- EF 30-35% June 2014   Type 2 diabetes mellitus   Chronic combined systolic and diastolic CHF   Long term (current) use of anticoagulants   CKD (chronic kidney disease) stage 4, GFR 15-29 ml/min   PAF- recurrent despite Amiodarone and multiple cardioversions   Mitral insufficiency, moderate to severe   Chronic massive bilat LE lymphadema   Fall at home- 3 falls this summer, sound orthostatic   Hypotension   Acute on chronic renal failure    Time spent: 25 min    [place] [facility] Pager [phone] 7PM-7AM, please contact night-coverage at  [email or web address], password [facility] 09/24/2014, 3:27 PM  LOS: 15 days

## 2014-09-24 NOTE — Progress Notes (Signed)
Patient Profile: 64 y.o. female with history of fibromyalgia, hypothyroidism, solitary kidney, DM, NICM w/ EF 35%, atrial fibrillation who was admitted on 9/10 w/ recurrent LE cellulitis. PCCM saw 9/11 for persistent hypotension/sepsis and worsening renal failure. Was in ICU on pressors until 9/14. Transferred to telemetry on 9/15. Patient is on cefazolin from 9/14.    Subjective: Pt reports right hand edema has increased significantly. She remains frustrated and is tearful. No other complaints.   Objective: Vital signs in last 24 hours: Temp:  [97.9 F (36.6 C)-99.1 F (37.3 C)] 97.9 F (36.6 C) (09/25 0538) Pulse Rate:  [55-94] 77 (09/25 0538) Resp:  [17-18] 18 (09/25 0538) BP: (90-97)/(47-54) 90/53 mmHg (09/25 0538) SpO2:  [93 %-98 %] 98 % (09/25 0538) Weight:  [343 lb 6.4 oz (155.765 kg)-350 lb (158.759 kg)] 344 lb 8 oz (156.264 kg) (09/25 0538) Last BM Date: 09/22/14  Intake/Output from previous day: 09/24 0701 - 09/25 0700 In: 1052 [P.O.:942; I.V.:110] Out: 1200 [Urine:1200] Intake/Output this shift:    Medications Current Facility-Administered Medications  Medication Dose Route Frequency Provider Last Rate Last Dose  . 0.9 %  sodium chloride infusion  250 mL Intravenous PRN Ivor Costa, MD 10 mL/hr at 09/18/14 0844 250 mL at 09/18/14 0844  . 0.9 %  sodium chloride infusion   Intravenous Once Hosie Poisson, MD      . albuterol (PROVENTIL) (2.5 MG/3ML) 0.083% nebulizer solution 2.5 mg  2.5 mg Nebulization Q6H PRN Ivor Costa, MD      . ALPRAZolam Duanne Moron) tablet 0.25 mg  0.25 mg Oral BID PRN Hosie Poisson, MD   0.25 mg at 09/23/14 0848  . amiodarone (PACERONE) tablet 400 mg  400 mg Oral Daily Leonie Man, MD   400 mg at 09/23/14 1007  . calcitRIOL (ROCALTROL) capsule 0.25 mcg  0.25 mcg Oral BID Ivor Costa, MD   0.25 mcg at 09/23/14 2316  . calcium carbonate (TUMS - dosed in mg elemental calcium) chewable tablet 200 mg of elemental calcium  1 tablet Oral BID PRN Geradine Girt,  DO   200 mg of elemental calcium at 09/14/14 1603  . calcium-vitamin D (OSCAL WITH D) 500-200 MG-UNIT per tablet 1 tablet  1 tablet Oral BID Ivor Costa, MD   1 tablet at 09/23/14 2314  . collagenase (SANTYL) ointment   Topical Daily Hosie Poisson, MD      . febuxostat (ULORIC) tablet 80 mg  80 mg Oral Daily Ivor Costa, MD   80 mg at 09/23/14 1006  . furosemide (LASIX) injection 80 mg  80 mg Intravenous BID Leonie Man, MD   80 mg at 09/23/14 1719  . gabapentin (NEURONTIN) capsule 300 mg  300 mg Oral BID Hosie Poisson, MD   300 mg at 09/23/14 2315  . HYDROcodone-acetaminophen (NORCO) 10-325 MG per tablet 1 tablet  1 tablet Oral Q4H PRN Hosie Poisson, MD   1 tablet at 09/24/14 0711  . hydrOXYzine (ATARAX/VISTARIL) tablet 25 mg  25 mg Oral Q4H PRN Ivor Costa, MD   25 mg at 09/18/14 2219  . insulin aspart (novoLOG) injection 0-15 Units  0-15 Units Subcutaneous TID WC Geradine Girt, DO   2 Units at 09/23/14 1719  . insulin aspart (novoLOG) injection 0-5 Units  0-5 Units Subcutaneous QHS Geradine Girt, DO   2 Units at 09/22/14 0048  . insulin aspart (novoLOG) injection 2 Units  2 Units Subcutaneous TID WC Hosie Poisson, MD   2 Units at 09/24/14 0710  .  insulin glargine (LANTUS) injection 22 Units  22 Units Subcutaneous QHS Hosie Poisson, MD   22 Units at 09/23/14 2312  . iron polysaccharides (NIFEREX) capsule 150 mg  150 mg Oral BID Ivor Costa, MD   150 mg at 09/23/14 2314  . levothyroxine (SYNTHROID, LEVOTHROID) tablet 125 mcg  125 mcg Oral QAC breakfast Ivor Costa, MD   125 mcg at 09/24/14 [phone]  . linagliptin (TRADJENTA) tablet 5 mg  5 mg Oral Daily Wilhelmina Mcardle, MD   5 mg at 09/23/14 1006  . multivitamin with minerals tablet 1 tablet  1 tablet Oral Daily Ivor Costa, MD   1 tablet at 09/22/14 1040  . mupirocin cream (BACTROBAN) 2 %   Topical Daily Hosie Poisson, MD      . nitroGLYCERIN (NITROSTAT) SL tablet 0.4 mg  0.4 mg Sublingual Q5 min PRN Ivor Costa, MD      . potassium chloride (K-DUR) CR tablet 20  mEq  20 mEq Oral Daily Rhonda G Barrett, PA-C   20 mEq at 09/23/14 1005  . saccharomyces boulardii (FLORASTOR) capsule 250 mg  250 mg Oral BID Ivor Costa, MD   250 mg at 09/23/14 2313  . sodium chloride 0.9 % bolus 1,000 mL  1,000 mL Intravenous PRN Erick Colace, NP      . sodium chloride 0.9 % bolus 1,000 mL  1,000 mL Intravenous PRN Erick Colace, NP      . sodium chloride 0.9 % injection 10-40 mL  10-40 mL Intracatheter PRN Raylene Miyamoto, MD   20 mL at 09/21/14 1928  . sodium chloride 0.9 % injection 3 mL  3 mL Intravenous Q12H Ivor Costa, MD   3 mL at 09/23/14 1050  . sodium chloride 0.9 % injection 3 mL  3 mL Intravenous PRN Ivor Costa, MD      . Warfarin - Pharmacist Dosing Inpatient   Does not apply q1800 Saundra Shelling, [facility]        PE: General appearance: alert, cooperative and morbidly obese Lungs: clear to auscultation bilaterally Heart: irregularly irregular rhythm Extremities: 3-4+ bilateral edema of bilateral LEE, RUE also with significant 3+ edema Pulses: 2+ and symmetric Skin: warm and dry Neurologic: Grossly normal  Lab Results:   Recent Labs  09/21/14 1330 09/22/14 0500 09/24/14 0645  WBC 10.0 7.6 5.6  HGB 7.9* 8.5* 8.5*  HCT 24.8* 26.5* 26.4*  PLT 308 259 266   BMET  Recent Labs  09/22/14 0500 09/23/14 0535 09/24/14 0645  NA 134* 132* 133*  K 4.9 4.5 4.4  CL 96 94* 96  CO2 28 26 27   GLUCOSE 139* 114* 131*  BUN 64* 64* 66*  CREATININE 2.10* 2.09* 2.20*  CALCIUM 7.9* 8.1* 8.2*   PT/INR  Recent Labs  09/22/14 0500 09/23/14 0535 09/24/14 0645  LABPROT 23.3* 21.7* 19.5*  INR 2.07* 1.89* 1.65*    Assessment/Plan  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 Acute on Chronic Systolic HF: still significantly volume overloaded and now with increased RUE edema compared to yesterday. I spoke with RN. I/Os from yesterday are not accurate as tech forgot to record output during a shift. The importance of strict I/Os with documentation was stressed. Also, patient was unable to stand for standing weight yesterday. Per RN, a bed was zeroed out and patient was transferred to that bed. Her weight yesterday was 343.4. Today's weight is 344. With her amount of edema, she will require continuation of IV Lasix. Continue strict I/Os and low sodium diet. Continue to monitor BP, renal function and electrolytes closely.   2. Persistent A-fib: rate controlled. Continue amiodarone, but reduce to once daily dosing. AC with coumadin.   3. Chronic oral anticoagulation: on Warfarin for Afib. INR is subtherapeutic today at 1.65. Goal is 2-3. Warfarin was held due to anemia but has been restarted. Pharmacy is dosing.   3. Cellulitis of the LEE: she has completed course of antibiotics.    4. Anemia: s/p transfusion. Hgb remains stable at 8.5. No further decreases.   5. CKD: Scr has increased from 2.09 to 2.20. Continue to monitor.  6. Tremors: Pt reports improvement. ? If due to electrolyte disturbances. K is WNL however patient is hyponatremic with sodium level at 132. Calcium also low at 8.1.      LOS: 15 days    Brittainy M. Toneisha, [place] 09/24/2014 8:46 AM   I have seen and evaluated the patient this AM along with Ellen Henri, PA. I agree with her findings, examination as well as impression recommendations.    Tremors have  improved.  Weights continue to be difficult to interpret. Apparently she is gaining pounds despite diuresis. Her renal function has made a slight trend upwards. I think this may be the time for [place] to consider switching from IV Lasix to oral medications. He isn't sure that she is able to have adequate diuresis with oral Demadex.  Plan will be to convert to 100 mg twice a day Demadex with additional Zaroxolyn every other day with a morning dose. Would need to monitor potassium levels closely.   Unfortunately, not sure for a bili get her down to her dry weight we continued with IV Lasix. This is a very slow process and does not. She has lost any weight despite significant diuresis. A good portion of her edema is probably noncardiac he is related to her hypoalbuminemia and lymphedema.  Overall her cellulitis appears to be improved -- converted to by mouth medications. She is quite deconditioned and will definitely require skilled nursing facility on discharge. Please see Dr. Victorino December last note for details on her preference.   We will continue to follow over the weekend.  Leonie Man, M.D., M.S. Interventional Cardiologist   Pager # [phone]

## 2014-09-24 NOTE — Progress Notes (Signed)
ANTICOAGULATION CONSULT NOTE - Follow Up Consult  Pharmacy Consult:  Coumadin Indication: atrial fibrillation  Allergies  Allergen Reactions  . Ivp Dye [Iodinated Diagnostic Agents] Shortness Of Breath    Turn red, can't breathe  . Betadine [Povidone Iodine]   . Diphenhydramine Hcl Swelling    In hands and eyes  . Fish Allergy Nausea And Vomiting  . Iohexol      Desc: PT TURNS RED AND WHEEZING   . Penicillins Other (See Comments)    Resp arrest as child. Tolerates cephalosporins.  . Povidone-Iodine Other (See Comments)    Wheezing, turn red, can't breath, and blisters  . Promethazine Hcl Other (See Comments)    Low Blood Pressure  . Tape     Blisters, can use paper tape for short periods  . Clindamycin Hives and Rash    wheezing  . Iodine Rash  . Morphine Sulfate Nausea And Vomiting and Rash  . Primaxin [Imipenem] Rash    Patient Measurements: Height: 5' 6.93" (170 cm) Weight: 344 lb 8 oz (156.264 kg) IBW/kg (Calculated) : 61.44  Vital Signs: Temp: 97.9 F (36.6 C) (09/25 0538) Temp src: Oral (09/25 0538) BP: 94/54 mmHg (09/25 0908) Pulse Rate: 77 (09/25 0538)  Labs:  Recent Labs  09/21/14 1330  09/22/14 0500 09/23/14 0535 09/24/14 0645  HGB 7.9*  --  8.5*  --  8.5*  HCT 24.8*  --  26.5*  --  26.4*  PLT 308  --  259  --  266  LABPROT  --   --  23.3* 21.7* 19.5*  INR  --   --  2.07* 1.89* 1.65*  CREATININE  --   < > 2.10* 2.09* 2.20*  < > = values in this interval not displayed.  Estimated Creatinine Clearance: 39.5 ml/min (by C-G formula based on Cr of 2.2).  Assessment: [name] continues on Coumadin from PTA for history of Afib.  INR supra-therapeutic (3.4) on admit, then peaked at 5 likely due to drug-drug interactions with Cipro and Flagyl.  She then received Vitamin K 1mg  IV x 1 on 09/12/14.   She has required lower doses than her home regimen during this admission d/t high INRs. As a result of this, her INR dropped to slightly below goal today at  1.65.  Her dose was increased 9/23 and I would expect we would see a response 9/26. Hgb and plts with slight increases today. No bleeding noted.  Goal of Therapy:  INR 2-3 Monitor platelets by anticoagulation protocol: Yes  Plan:  Warfarin 2.5mg  po x1 tonight Daily PT/INR - if INR does not rise will increase warfarin dose CBC in the morning Anticipate patient will need to go home on a lower total weekly dose of warfarin than she was on previously Follow for s/s bleeding  Legrand Como, Pharm.D., BCPS, AAHIVP Clinical Pharmacist Phone: [phone] or [phone] 09/24/2014, 11:26 AM

## 2014-09-24 NOTE — Progress Notes (Signed)
NUTRITION CONSULT/FOLLOW UP  INTERVENTION:  Snacks TID  RD to follow for nutrition care plan  NUTRITION DIAGNOSIS: Increased nutrient needs related to wound healing as evidenced by estimated nutrition needs, ongoing  Goal: Pt to meet >/= 90% of their estimated nutrition needs, progressing  Monitor:  PO intake, weight, labs, I/O's  ASSESSMENT: 64 y.o. Female with history of fibromyalgia, hypothyroidism, solitary kidney, hypothyroidism, diabetes, nonischemic cardiomyopathy with ejection fraction of 35%, atrial fibrillation who presented to ED with left-sided lower leg pain.  Initial nutrition assessment completed 9/15.  RD re-consulted for hypoalbuminemia & third spacing.  Patient's PO intake variable at 50-100% per flowsheet records.  Increased nutrient needs ongoing given wound presence.  RD originally ordered Prostat liquid protein, however pt had been refusing.  Does not want any other oral nutrition supplements offered per Clinical Nutrition.  Amenable to snacks.  RD to order.  Albumin has a half-life of 21 days and is strongly affected by stress response and inflammatory process, therefore, do not expect to see an improvement in this lab value during acute hospitalization.  Height: Ht Readings from Last 1 Encounters:  09/09/14 5' 6.93" (1.7 m)    Weight: Wt Readings from Last 1 Encounters:  09/24/14 344 lb 8 oz (156.264 kg)    BMI:  Body mass index is 54.07 kg/(m^2).  Estimated Nutritional Needs: Kcal: 1700-1900 Protein: 100-110 gm Fluid: 1.7-1.9 L  Skin:  Left thigh full thickness wound Right thigh full thickness wound Left outer calf with partial thickness stasis ulcers  Diet Order: Heart Healthy/Carbohydrate Modified    Intake/Output Summary (Last 24 hours) at 09/24/14 1550 Last data filed at 09/24/14 1300  Gross per 24 hour  Intake    735 ml  Output    700 ml  Net     35 ml   Labs:   Recent Labs Lab 09/22/14 0500 09/23/14 0535 09/24/14 0645   NA 134* 132* 133*  K 4.9 4.5 4.4  CL 96 94* 96  CO2 28 26 27   BUN 64* 64* 66*  CREATININE 2.10* 2.09* 2.20*  CALCIUM 7.9* 8.1* 8.2*  GLUCOSE 139* 114* 131*    CBG (last 3)   Recent Labs  09/23/14 2106 09/24/14 0700 09/24/14 1102  GLUCAP 128* 120* 141*    Scheduled Meds: . sodium chloride   Intravenous Once  . amiodarone  400 mg Oral Daily  . calcitRIOL  0.25 mcg Oral BID  . calcium-vitamin D  1 tablet Oral BID  . collagenase   Topical Daily  . febuxostat  80 mg Oral Daily  . gabapentin  300 mg Oral BID  . insulin aspart  0-15 Units Subcutaneous TID WC  . insulin aspart  0-5 Units Subcutaneous QHS  . insulin aspart  2 Units Subcutaneous TID WC  . insulin glargine  22 Units Subcutaneous QHS  . iron polysaccharides  150 mg Oral BID  . levothyroxine  125 mcg Oral QAC breakfast  . linagliptin  5 mg Oral Daily  . metolazone  2.5 mg Oral QODAY  . multivitamin with minerals  1 tablet Oral Daily  . mupirocin cream   Topical Daily  . potassium chloride  20 mEq Oral Daily  . saccharomyces boulardii  250 mg Oral BID  . sodium chloride  3 mL Intravenous Q12H  . [START ON 09/25/2014] torsemide  100 mg Oral BID  . warfarin  2.5 mg Oral ONCE-1800  . Warfarin - Pharmacist Dosing Inpatient   Does not apply q1800    Continuous  Infusions:   Past Medical History  Diagnosis Date  . Personal history of other diseases of circulatory system   . 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 Chronic lymphocytic thyroiditis   . Morbid obesity   . Other chronic pulmonary heart diseases   . Obstructive sleep apnea (adult) (pediatric)   . Other and unspecified hyperlipidemia   . Fibromyalgia   . Goiter   . Thyroiditis, autoimmune   . Hypothyroidism, acquired, autoimmune   . Solitary kidney, acquired   . Fatigue   . Hyperparathyroidism, secondary   . Vitamin D deficiency disease   . Hyperparathyroidism   . H/O varicose veins 03/27/12  . Type II or unspecified type  diabetes mellitus without mention of complication, not stated as uncontrolled   . Diabetes mellitus type II   . Infections of kidney 03/27/12  . History of measles, mumps, or rubella 03/27/12  . Tubal pregnancy 12/23/76  . Unspecified essential hypertension     20 yrs ago  . Blood transfusion 1977    [facility]  . Complication of anesthesia 2010    hard to wake up after knee surgery  . Anemia   . Cardiomyopathy, nonischemic     EF 45 %  . Endometrial hyperplasia with atypia 05/05/12  . Diastolic HF (heart failure) 11/05/2012  . A-fib   . Echocardiogram abnormal 07/15/12    severe MR,mod to severe TR,mod to severe PA hypertension  . Pulmonary arterial hypertension   . PAF (paroxysmal atrial fibrillation), 1st episode since DCCV in Jan/14 03/19/2013  . Ulcers of both lower extremities, small, now with cellulitis 06/05/2013    Past Surgical History  Procedure Laterality Date  . Cholecystectomy    . Pacemaker placement  02/26/11    Medtronic Adapta  . Knee surgery    . Resection duplicate left kidney    . 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 Hernia repair  1999    ruptured ;  . D& c with hysteroscopy & cervical polypectomy  05/05/12  . Cardioversion N/A 02/06/2013    Successful  . Iud removal  01/30/13  . Ectopic pregnancy surgery  1978  . Cardioversion N/A 06/08/2013    Procedure: CARDIOVERSION;  Surgeon: Sanda Klein, MD;  Location: [place];  Service: Cardiovascular;  Laterality: N/A;  Room [id number]  . Cardioversion N/A 02/01/2014    Procedure: CARDIOVERSION;  Surgeon: Sanda Klein, MD;  Location: [place];  Service: Cardiovascular;  Laterality: N/A;  . Cardioversion N/A 06/02/2014    Procedure: CARDIOVERSION;  Surgeon: Sanda Klein, MD;  Location: [place];  Service: Cardiovascular;  Laterality: N/A;    Arthur Holms, RD, LDN Pager #: [phone] After-Hours Pager #: [phone]

## 2014-09-24 NOTE — Progress Notes (Signed)
Pt tolerated dressing changes well. Educated pt about air mattress, Pt states she will not benefit from the mattress. Will continue to monitor. Etta Quill, RN

## 2014-09-24 NOTE — Clinical Social Work Note (Signed)
Clinical Social Worker continuing to follow patient and family for support and discharge planning needs.  CSW attempted to meet with patient at bedside however RN's were providing wound care.  Patient did verbalize interest in another SNF but unable to remember the name - CSW to follow up tomorrow when patient sister is present to determine facility of patient wishes.  CSW remains available for support and to facilitate patient discharge needs once medically ready.  Barbette Or, [place]

## 2014-09-25 LAB — BASIC METABOLIC PANEL
Anion gap: 12 (ref 5–15)
BUN: 71 mg/dL — AB (ref 6–23)
CHLORIDE: 93 meq/L — AB (ref 96–112)
CO2: 26 meq/L (ref 19–32)
Calcium: 8 mg/dL — ABNORMAL LOW (ref 8.4–10.5)
Creatinine, Ser: 2.44 mg/dL — ABNORMAL HIGH (ref 0.50–1.10)
GFR calc non Af Amer: 20 mL/min — ABNORMAL LOW (ref >90)
GFR, EST AFRICAN AMERICAN: 23 mL/min — AB (ref >90)
Glucose, Bld: 186 mg/dL — ABNORMAL HIGH (ref 70–99)
POTASSIUM: 4.6 meq/L (ref 3.7–5.3)
Sodium: 131 mEq/L — ABNORMAL LOW (ref 137–147)

## 2014-09-25 LAB — GLUCOSE, CAPILLARY
Glucose-Capillary: 189 mg/dL — ABNORMAL HIGH (ref 70–99)
Glucose-Capillary: 186 mg/dL — ABNORMAL HIGH (ref 70–99)
Glucose-Capillary: 193 mg/dL — ABNORMAL HIGH (ref 70–99)
Glucose-Capillary: 208 mg/dL — ABNORMAL HIGH (ref 70–99)

## 2014-09-25 LAB — PROTIME-INR
INR: 1.78 — ABNORMAL HIGH (ref 0.00–1.49)
PROTHROMBIN TIME: 20.7 s — AB (ref 11.6–15.2)

## 2014-09-25 MED ORDER — WARFARIN SODIUM 2.5 MG PO TABS
2.5000 mg | ORAL_TABLET | Freq: Once | ORAL | Status: AC
  Administered 2014-09-25: 2.5 mg via ORAL
  Filled 2014-09-25: qty 1

## 2014-09-25 NOTE — Progress Notes (Signed)
PROGRESS NOTE  [name] OVZ:[id number] DOB: Nov 01, 1950 DOA: 09/09/2014 PCP: Elby Showers, MD  64 y.o. female with history of fibromyalgia, hypothyroidism, solitary kidney, hypothyroidism, diabetes, nonischemic cardiomyopathy with ejection fraction of 35%, atrial fibrillation who was admitted on 9/10 w/ recurrent Lower extremity cellulitis. PCCM asked to see in consult on 9/11 for persistent hypotension and worsening renal failure. Was in ICU on pressors until 9/14. Transferred to telemetry on 9/15.  Patient is on cefazolin from 9/14.   Assessment/Plan: Septic shock due to Left lower leg cellulitis:  - abx cefazolin from 9/14 , in addition to IV vancomycin for 4 days , and ciprofloxacin and flagyl for 3 days.   - Keep lower extremity elevated.  - blood culture X 2 - 1/2 group B strep,  completed 14 days of antibiotics, between Vanc and ancef  - Ancef stopped 9/24 - Wound care following  .Acute on  Combined systolic and diastolic congestive heart failure- EF 30-35% June 2014  -with volume overload, anasarca, third spacing due to severe hypoalbuminemia -was being diuresed with IV lasix per Cards, now changed to Po demadex and metolazone -creatinine stable, negative 16.2L -no ACE due to AKI -baseline dry weight unclear around 330lbs, still has some ways to have, unable to get a accurate weight everyday  RUE swelling -dopplers 9/22 negative, due to third spacing  . Type 2 diabetes mellitus:  - A1c 7.0  - lantus increased to 22 units.  - SSI while inpatient   . PAF (paroxysmal atrial fibrillation)  - Currently with a paced rhythm - Coumadin dosed by pharmacy, continue amiodarone   . Hypothyroidism, acquired, autoimmune: Recent TSH was 2.12 on 05/28/14.  - Continue levothyroxine     Gout: stable  -Continue Uloric  . CKD (chronic kidney disease) stage 4, GFR 15-29 ml/min: ccl slightly higher from baseline 2.5-3.00 to 3.38 on admission  - improved to 2 range,  now starting to  trend up some, monitor with diuresis   . PACEMAKER, PERMANENT   . Obesity hypoventilation syndrome  - CPAP QHS  . OBESITY, MORBID    Hypokalemia:  - Replete as needed.     Anemia: -due to CKD and acute illness and iron defi -baseline around 9, transfused 1unit PRBC 9/22  Code Status: full Family Communication: patient Disposition Plan: SNF when stable, hopefully early next week   Consultants:  PCCM  cards  Procedures:  none   HPI/Subjective: Feels better today, breathing ok  Objective: Filed Vitals:   09/25/14 0700  BP: 89/41  Pulse: 83  Temp: 98.6 F (37 C)  Resp: 17    Intake/Output Summary (Last 24 hours) at 09/25/14 1331 Last data filed at 09/25/14 0700  Gross per 24 hour  Intake    290 ml  Output    925 ml  Net   -635 ml   Filed Weights   09/23/14 1830 09/24/14 0538 09/25/14 0700  Weight: 155.765 kg (343 lb 6.4 oz) 156.264 kg (344 lb 8 oz) 158.26 kg (348 lb 14.4 oz)    Exam:   General:  Pleasant/A+Ox3, morbidly obese  Cardiovascular: irregular, s1s2.   Respiratory: clear, no wheezing. Good air entry bilateral.   Abdomen: obese, soft non tender non distended bowel sounds heard.   Musculoskeletal: 2-3+Edema, less redness on left leg   Skin: stage 3 sacral decubitus, .multiple ulcers over the lower extremities.   Data Reviewed: Basic Metabolic Panel:  Recent Labs Lab 09/21/14 1730 09/22/14 0500 09/23/14 0535 09/24/14 0645 09/25/14 0539  NA  131* 134* 132* 133* 131*  K 4.6 4.9 4.5 4.4 4.6  CL 94* 96 94* 96 93*  CO2 27 28 26 27 26   GLUCOSE 179* 139* 114* 131* 186*  BUN 64* 64* 64* 66* 71*  CREATININE 2.06* 2.10* 2.09* 2.20* 2.44*  CALCIUM 8.0* 7.9* 8.1* 8.2* 8.0*   Liver Function Tests:  Recent Labs Lab 09/22/14 0500  AST 22  ALT <5  ALKPHOS 194*  BILITOT 0.7  PROT 6.4  ALBUMIN 1.6*   No results found for this basename: LIPASE, AMYLASE,  in the last 168 hours No results found for this basename: AMMONIA,  in the  last 168 hours CBC:  Recent Labs Lab 09/21/14 0525 09/21/14 1330 09/22/14 0500 09/24/14 0645  WBC 8.0 10.0 7.6 5.6  HGB 7.3* 7.9* 8.5* 8.5*  HCT 22.5* 24.8* 26.5* 26.4*  MCV 97.8 100.4* 99.6 97.4  PLT 255 308 259 266   Cardiac Enzymes: No results found for this basename: CKTOTAL, CKMB, CKMBINDEX, TROPONINI,  in the last 168 hours BNP (last 3 results) No results found for this basename: PROBNP,  in the last 8760 hours CBG:  Recent Labs Lab 09/24/14 1102 09/24/14 1632 09/24/14 2114 09/25/14 0645 09/25/14 1116  GLUCAP 141* 194* 182* 189* 186*    Recent Results (from the past 240 hour(s))  CULTURE, BLOOD (ROUTINE X 2)     Status: None   Collection Time    09/15/14  1:35 PM      Result Value Ref Range Status   Specimen Description BLOOD LEFT ANTECUBITAL   Final   Special Requests BOTTLES DRAWN AEROBIC AND ANAEROBIC 10CC   Final   Culture  Setup Time     Final   Value: 09/15/2014 18:02     Performed at [place]     Final   Value: NO GROWTH 5 DAYS     Performed at [facility]   Report Status 09/21/2014 FINAL   Final  CLOSTRIDIUM DIFFICILE BY PCR     Status: None   Collection Time    09/20/14  9:04 AM      Result Value Ref Range Status   C difficile by pcr NEGATIVE  NEGATIVE Final     Studies: No results found.  Scheduled Meds: . sodium chloride   Intravenous Once  . amiodarone  400 mg Oral Daily  . calcitRIOL  0.25 mcg Oral BID  . calcium-vitamin D  1 tablet Oral BID  . collagenase   Topical Daily  . febuxostat  80 mg Oral Daily  . gabapentin  300 mg Oral BID  . insulin aspart  0-15 Units Subcutaneous TID WC  . insulin aspart  0-5 Units Subcutaneous QHS  . insulin aspart  2 Units Subcutaneous TID WC  . insulin glargine  22 Units Subcutaneous QHS  . iron polysaccharides  150 mg Oral BID  . levothyroxine  125 mcg Oral QAC breakfast  . linagliptin  5 mg Oral Daily  . metolazone  2.5 mg Oral QODAY  . multivitamin with minerals   1 tablet Oral Daily  . mupirocin cream   Topical Daily  . potassium chloride  20 mEq Oral Daily  . saccharomyces boulardii  250 mg Oral BID  . sodium chloride  3 mL Intravenous Q12H  . torsemide  100 mg Oral BID  . Warfarin - Pharmacist Dosing Inpatient   Does not apply q1800   Continuous Infusions:  Antibiotics Given (last 72 hours)   Date/Time Action  Medication Dose Rate   09/22/14 2002 Given   ceFAZolin (ANCEF) IVPB 2 g/50 mL premix 2 g 100 mL/hr      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   Obesity hypoventilation syndrome-    Cardiomyopathy, nonischemic- EF 30-35% June 2014   Type 2 diabetes mellitus   Chronic combined systolic and diastolic CHF   Long term (current) use of anticoagulants   CKD (chronic kidney disease) stage 4, GFR 15-29 ml/min   PAF- recurrent despite Amiodarone and multiple cardioversions   Mitral insufficiency, moderate to severe   Chronic massive bilat LE lymphadema   Fall at home- 3 falls this summer, sound orthostatic   Hypotension   Acute on chronic renal failure    Time spent: 25 min    [facility] Pager [phone] 7PM-7AM, please contact night-coverage at [email or web address], password [facility] 09/25/2014, 1:31 PM  LOS: 16 days

## 2014-09-25 NOTE — Progress Notes (Signed)
Patient Name: [name] Date of Encounter: 09/25/2014  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   Obesity hypoventilation syndrome-    Cardiomyopathy, nonischemic- EF 30-35% June 2014   Type 2 diabetes mellitus   Chronic combined systolic and diastolic CHF   Long term (current) use of anticoagulants   CKD (chronic kidney disease) stage 4, GFR 15-29 ml/min   PAF- recurrent despite Amiodarone and multiple cardioversions   Mitral insufficiency, moderate to severe   Chronic massive bilat LE lymphadema   Fall at home- 3 falls this summer, sound orthostatic   Hypotension   Acute on chronic renal failure   Length of Stay: 16  SUBJECTIVE  The patient determined not to be on HD despite feeling uncomfortable with all extremity swelling and baseline SOB.   CURRENT MEDS . sodium chloride   Intravenous Once  . amiodarone  400 mg Oral Daily  . calcitRIOL  0.25 mcg Oral BID  . calcium-vitamin D  1 tablet Oral BID  . collagenase   Topical Daily  . febuxostat  80 mg Oral Daily  . gabapentin  300 mg Oral BID  . insulin aspart  0-15 Units Subcutaneous TID WC  . insulin aspart  0-5 Units Subcutaneous QHS  . insulin aspart  2 Units Subcutaneous TID WC  . insulin glargine  22 Units Subcutaneous QHS  . iron polysaccharides  150 mg Oral BID  . levothyroxine  125 mcg Oral QAC breakfast  . linagliptin  5 mg Oral Daily  . metolazone  2.5 mg Oral QODAY  . multivitamin with minerals  1 tablet Oral Daily  . mupirocin cream   Topical Daily  . potassium chloride  20 mEq Oral Daily  . saccharomyces boulardii  250 mg Oral BID  . sodium chloride  3 mL Intravenous Q12H  . torsemide  100 mg Oral BID  . Warfarin - Pharmacist Dosing Inpatient   Does not apply q1800    OBJECTIVE  Filed Vitals:   09/24/14 0908 09/24/14 1357 09/24/14 2119 09/25/14 0700  BP: 94/54 103/54 85/48 89/41   Pulse:  96  99 83  Temp:  98.2 F (36.8 C) 99.3 F (37.4 C) 98.6 F (37 C)  TempSrc:  Oral Oral Oral  Resp:  19 18 17   Height:      Weight:    348 lb 14.4 oz (158.26 kg)  SpO2:  98% 94% 94%    Intake/Output Summary (Last 24 hours) at 09/25/14 1052 Last data filed at 09/25/14 0700  Gross per 24 hour  Intake    323 ml  Output    925 ml  Net   -602 ml   Filed Weights   09/23/14 1830 09/24/14 0538 09/25/14 0700  Weight: 343 lb 6.4 oz (155.765 kg) 344 lb 8 oz (156.264 kg) 348 lb 14.4 oz (158.26 kg)    PHYSICAL EXAM  General: Pleasant, appears SOB while talking.  Neuro: Alert and oriented X 3. Moves all extremities spontaneously. Psych: Normal affect. HEENT:  Normal  Neck: Supple without bruits or JVD. Lungs:  Resp regular and unlabored, CTA. Heart: RRR no s3, s4, or murmurs. Abdomen: Soft, non-tender, non-distended, BS + x 4.  Extremities: No clubbing, cyanosis, severe swelling of upper and lower extremities. DP/PT/Radials 2+ and equal bilaterally.  Accessory Clinical Findings  CBC  Recent Labs  09/24/14 0645  WBC  5.6  HGB 8.5*  HCT 26.4*  MCV 97.4  PLT 161   Basic Metabolic Panel  Recent Labs  09/24/14 0645 09/25/14 0539  NA 133* 131*  K 4.4 4.6  CL 96 93*  CO2 27 26  GLUCOSE 131* 186*  BUN 66* 71*  CREATININE 2.20* 2.44*  CALCIUM 8.2* 8.0*   Radiology/Studies  Dg Chest Port 1 View  09/14/2014  IMPRESSION: 1. Stable support apparatus. 2. Unchanged cardiomegaly. 3. Improved central pulmonary edema with persistent pulmonary vascular congestion.    TELE: SR, 58' BPM   ASSESSMENT AND PLAN  64 y.o. female with history of fibromyalgia, hypothyroidism, solitary kidney, DM, NICM w/ EF 35%, atrial fibrillation who was admitted on 9/10 w/ recurrent LE cellulitis. PCCM saw 9/11 for persistent hypotension/sepsis and worsening renal failure. Was in ICU on pressors until 9/14. Transferred to telemetry on 9/15. Patient is on cefazolin from 9/14.   Principal Problem:    Cellulitis of Lt lower leg  Active Problems:  Sleep apnea- on C-pap  Pulm HTN with severe TR  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  Obesity hypoventilation syndrome-  Cardiomyopathy, nonischemic- EF 30-35% June 2014  Type 2 diabetes mellitus  Chronic combined systolic and diastolic CHF  Long term (current) use of anticoagulants  CKD (chronic kidney disease) stage 4, GFR 15-29 ml/min  PAF- recurrent despite Amiodarone and multiple cardioversions  Mitral insufficiency, moderate to severe  Chronic massive bilat LE lymphadema  Fall at home- 3 falls this summer, sound orthostatic  Hypotension  Acute on chronic renal failure   1. Acute on Chronic Systolic HF: still significantly volume overloaded and now with increased RUE edema compared to yesterday. She gained another 5 lbs despite - 600 cc recorded. However, when comparing to her physical exam weight gain seems real. Crea continues to increase 2.2 --> 2.4 with torsemide and metolazone.  She refuses HD.  I would recommend to continue the same dose od diuretics, even though they are minimally effective.    2. Persistent A-fib: rate controlled. Continue amiodarone, but reduce to once daily dosing. AC with coumadin.  3. Chronic oral anticoagulation: on Warfarin for Afib. INR is subtherapeutic today at 1.65. Goal is 2-3. Warfarin was held due to anemia but has been restarted. Pharmacy is dosing.  3. Cellulitis of the LEE: she has completed course of antibiotics.  4. Anemia: s/p transfusion. Hgb remains stable at 8.5. No further decreases.  5. CKD: Scr has increased from 2.09 to 2.20. Continue to monitor.  6. Tremors: Pt reports improvement. ? If due to electrolyte disturbances. K is WNL however patient is hyponatremic with sodium level at 132. Calcium also low at 8.1.  LOS: 15 days  Brittainy M. Keylin, [place]  09/24/2014  8:46 AM  I have seen and evaluated the patient this AM along with Ellen Henri, PA. I agree with her findings, examination as well as  impression recommendations.   Tremors have improved.  Weights continue to be difficult to interpret. Apparently she is gaining pounds despite diuresis. Her renal function has made a slight trend upwards. I think this may be the time for [place] to consider switching from IV Lasix to oral medications. He isn't sure that she is able to have adequate diuresis with oral Demadex.  Plan will be to convert to 100 mg twice a day Demadex with additional Zaroxolyn every other day with a morning dose. Would need to monitor potassium levels closely.  Unfortunately, not sure for a bili get her down to her dry  weight we continued with IV Lasix. This is a very slow process and does not. She has lost any weight despite significant diuresis. A good portion of her edema is probably noncardiac he is related to her hypoalbuminemia and lymphedema.  Overall her cellulitis appears to be improved -- converted to by mouth medications.  She is quite deconditioned and will definitely require skilled nursing facility on discharge. Please see Dr. Victorino December last note for details on her preference.  We will continue to follow over the weekend.  Signed, Dorothy Spark MD, [facility] 09/25/2014

## 2014-09-25 NOTE — Progress Notes (Signed)
ANTICOAGULATION CONSULT NOTE - Follow Up Consult  Pharmacy Consult:  Coumadin Indication: atrial fibrillation  Allergies  Allergen Reactions  . Ivp Dye [Iodinated Diagnostic Agents] Shortness Of Breath    Turn red, can't breathe  . Betadine [Povidone Iodine]   . Diphenhydramine Hcl Swelling    In hands and eyes  . Fish Allergy Nausea And Vomiting  . Iohexol      Desc: PT TURNS RED AND WHEEZING   . Penicillins Other (See Comments)    Resp arrest as child. Tolerates cephalosporins.  . Povidone-Iodine Other (See Comments)    Wheezing, turn red, can't breath, and blisters  . Promethazine Hcl Other (See Comments)    Low Blood Pressure  . Tape     Blisters, can use paper tape for short periods  . Clindamycin Hives and Rash    wheezing  . Iodine Rash  . Morphine Sulfate Nausea And Vomiting and Rash  . Primaxin [Imipenem] Rash    Patient Measurements: Height: 5' 6.93" (170 cm) Weight: 348 lb 14.4 oz (158.26 kg) IBW/kg (Calculated) : 61.44  Vital Signs: Temp: 98.6 F (37 C) (09/26 0700) Temp src: Oral (09/26 0700) BP: 89/41 mmHg (09/26 0700) Pulse Rate: 83 (09/26 0700)  Labs:  Recent Labs  09/23/14 0535 09/24/14 0645 09/25/14 0539  HGB  --  8.5*  --   HCT  --  26.4*  --   PLT  --  266  --   LABPROT 21.7* 19.5* 20.7*  INR 1.89* 1.65* 1.78*  CREATININE 2.09* 2.20* 2.44*    Estimated Creatinine Clearance: 35.9 ml/min (by C-G formula based on Cr of 2.44).  Assessment: 6 YOF continues on Coumadin from PTA for history of Afib.  INR supra-therapeutic (3.4) on admit, then peaked at 5 likely due to drug-drug interactions with Cipro and Flagyl.  She then received Vitamin K 1mg  IV x 1 on 09/12/14.   She has required lower doses than her home regimen during this admission d/t high INRs. As a result of this, her INR dropped to slightly below goal today at 1.65.  Her dose was increased 9/23 and her INR is starting to respond to this increased dose. No bleeding noted.  Goal  of Therapy:  INR 2-3 Monitor platelets by anticoagulation protocol: Yes  Plan:  Warfarin 2.5mg  po x1 tonight Daily PT/INR Follow for s/s bleeding  Legrand Como, Pharm.D., BCPS, AAHIVP Clinical Pharmacist Phone: [phone] or [phone] 09/25/2014, 1:30 PM

## 2014-09-25 NOTE — Clinical Social Work Note (Signed)
Clinical Social Work Intern Oretha Ellis) spoke with patient and patient sister Kieth Brightly) at bedside to offer continued support and discuss patient needs at discharge.  Patient and patient sister state that their top four choices for SNF placement are [facility], [facility], [facility] in [place], and [facility] in [place].  Patient sister remains very supportive of patient and hopeful for a positive placement experience.  Patient sister is available to assist in any way needed for patient discharge needs.  CSW remains available for support and to follow up with patient and patient sister regarding available bed offers.  Oretha Ellis, MSW [place], [place]

## 2014-09-26 DIAGNOSIS — G252 Other specified forms of tremor: Secondary | ICD-10-CM

## 2014-09-26 DIAGNOSIS — R259 Unspecified abnormal involuntary movements: Secondary | ICD-10-CM

## 2014-09-26 LAB — BASIC METABOLIC PANEL
Anion gap: 13 (ref 5–15)
BUN: 76 mg/dL — ABNORMAL HIGH (ref 6–23)
CO2: 25 mEq/L (ref 19–32)
CREATININE: 2.63 mg/dL — AB (ref 0.50–1.10)
Calcium: 7.9 mg/dL — ABNORMAL LOW (ref 8.4–10.5)
Chloride: 94 mEq/L — ABNORMAL LOW (ref 96–112)
GFR calc non Af Amer: 18 mL/min — ABNORMAL LOW (ref >90)
GFR, EST AFRICAN AMERICAN: 21 mL/min — AB (ref >90)
GLUCOSE: 154 mg/dL — AB (ref 70–99)
Potassium: 4.3 mEq/L (ref 3.7–5.3)
Sodium: 132 mEq/L — ABNORMAL LOW (ref 137–147)

## 2014-09-26 LAB — GLUCOSE, CAPILLARY
GLUCOSE-CAPILLARY: 181 mg/dL — AB (ref 70–99)
Glucose-Capillary: 160 mg/dL — ABNORMAL HIGH (ref 70–99)
Glucose-Capillary: 212 mg/dL — ABNORMAL HIGH (ref 70–99)
Glucose-Capillary: 180 mg/dL — ABNORMAL HIGH (ref 70–99)

## 2014-09-26 LAB — PROTIME-INR
INR: 1.67 — ABNORMAL HIGH (ref 0.00–1.49)
PROTHROMBIN TIME: 19.7 s — AB (ref 11.6–15.2)

## 2014-09-26 MED ORDER — WARFARIN SODIUM 5 MG PO TABS
5.0000 mg | ORAL_TABLET | Freq: Once | ORAL | Status: AC
  Administered 2014-09-26: 5 mg via ORAL
  Filled 2014-09-26: qty 1

## 2014-09-26 NOTE — Progress Notes (Signed)
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   Obesity hypoventilation syndrome-    Cardiomyopathy, nonischemic- EF 30-35% June 2014   Type 2 diabetes mellitus   Chronic combined systolic and diastolic CHF   Long term (current) use of anticoagulants   CKD (chronic kidney disease) stage 4, GFR 15-29 ml/min   PAF- recurrent despite Amiodarone and multiple cardioversions   Mitral insufficiency, moderate to severe   Chronic massive bilat LE lymphadema   Fall at home- 3 falls this summer, sound orthostatic   Hypotension   Acute on chronic renal failure   Length of Stay: 17  SUBJECTIVE  The patient determined not to be on HD despite feeling uncomfortable with all extremity swelling and baseline SOB. She complains of tremor.   CURRENT MEDS  . sodium chloride   Intravenous Once  . amiodarone  400 mg Oral Daily  . calcitRIOL  0.25 mcg Oral BID  . calcium-vitamin D  1 tablet Oral BID  . collagenase   Topical Daily  . febuxostat  80 mg Oral Daily  . gabapentin  300 mg Oral BID  . insulin aspart  0-15 Units Subcutaneous TID WC  . insulin aspart  0-5 Units Subcutaneous QHS  . insulin aspart  2 Units Subcutaneous TID WC  . insulin glargine  22 Units Subcutaneous QHS  . iron polysaccharides  150 mg Oral BID  . levothyroxine  125 mcg Oral QAC breakfast  . linagliptin  5 mg Oral Daily  . metolazone  2.5 mg Oral QODAY  . multivitamin with minerals  1 tablet Oral Daily  . mupirocin cream   Topical Daily  . potassium chloride  20 mEq Oral Daily  . saccharomyces boulardii  250 mg Oral BID  . sodium chloride  3 mL Intravenous Q12H  . torsemide  100 mg Oral BID  . Warfarin - Pharmacist Dosing Inpatient   Does not apply q1800   OBJECTIVE  Filed Vitals:   09/25/14 0700 09/25/14 1431 09/25/14 2023 09/26/14 0610  BP: 89/41 89/54  99/46 90/49  Pulse: 83 94 98 88  Temp: 98.6 F (37 C) 99 F (37.2 C) 98.5 F (36.9 C) 98.4 F (36.9 C)  TempSrc: Oral  Oral Oral  Resp: 17 19 18 18   Height:      Weight: 348 lb 14.4 oz (158.26 kg)   350 lb 4.8 oz (158.895 kg)  SpO2: 94% 94% 98% 91%    Intake/Output Summary (Last 24 hours) at 09/26/14 1135 Last data filed at 09/26/14 9326  Gross per 24 hour  Intake    240 ml  Output   1200 ml  Net   -960 ml   Filed Weights   09/24/14 0538 09/25/14 0700 09/26/14 0610  Weight: 344 lb 8 oz (156.264 kg) 348 lb 14.4 oz (158.26 kg) 350 lb 4.8 oz (158.895 kg)    PHYSICAL EXAM  General: Pleasant, appears SOB while talking.  Neuro: Alert and oriented X 3. Moves all extremities spontaneously. Psych: Normal affect. HEENT:  Normal  Neck: Supple without bruits or JVD. Lungs:  Resp regular and unlabored, CTA. Heart: RRR no s3, s4, or murmurs. Abdomen: Soft, non-tender, non-distended, BS + x 4.  Extremities: No clubbing, cyanosis, severe swelling of upper and lower extremities. DP/PT/Radials 2+ and equal bilaterally.  Accessory Clinical  Findings  CBC  Recent Labs  09/24/14 0645  WBC 5.6  HGB 8.5*  HCT 26.4*  MCV 97.4  PLT 782   Basic Metabolic Panel  Recent Labs  09/25/14 0539 09/26/14 0523  NA 131* 132*  K 4.6 4.3  CL 93* 94*  CO2 26 25  GLUCOSE 186* 154*  BUN 71* 76*  CREATININE 2.44* 2.63*  CALCIUM 8.0* 7.9*   Radiology/Studies  Dg Chest Port 1 View  09/14/2014  IMPRESSION: 1. Stable support apparatus. 2. Unchanged cardiomegaly. 3. Improved central pulmonary edema with persistent pulmonary vascular congestion.    TELE: SR, 69' BPM    ASSESSMENT AND PLAN  64 y.o. female with history of fibromyalgia, hypothyroidism, solitary kidney, DM, NICM w/ EF 35%, atrial fibrillation who was admitted on 9/10 w/ recurrent LE cellulitis. PCCM saw 9/11 for persistent hypotension/sepsis and worsening renal failure. Was in ICU on pressors until 9/14. Transferred to  telemetry on 9/15. Patient is on cefazolin from 9/14.   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 Acute on Chronic Systolic HF: still significantly volume overloaded and now with increased RUE edema compared to yesterday. She gained another 2 lbs despite - 960 cc recorded. However, when comparing to her physical exam weight gain seems real. Crea continues to increase 2.2 --> 2.4--2.6  with torsemide and metolazone.  She refuses HD.   The patient is adamant about HD. Diuretics don't seem to work and her Crea is worsening. She has low protein and is third spacing fluid into subcutaneous tissues. I would hold metolazone until Crea improves.    2. Persistent A-fib: rate controlled. Continue amiodarone, but reduce to once daily dosing. AC with coumadin.  3. Chronic oral anticoagulation: on Warfarin for Afib. INR is subtherapeutic today at 1.65. Goal is 2-3. Warfarin was held due to anemia but has been restarted. Pharmacy is dosing.  3. Cellulitis of the LEE: she has completed course of antibiotics.  4. Anemia: s/p transfusion. Hgb remains stable at 8.5. No further decreases.  5. CKD: Scr has increased from 2.09 to 2.20. Continue to monitor.  6. Tremors: I called the pharmacy to review if any meds can be causing tremors, gabapentin is new and one of the side effects is significant tremor. I would  discontinue.  Signed, Dorothy Spark MD, [facility] 09/26/2014

## 2014-09-26 NOTE — Progress Notes (Signed)
PROGRESS NOTE  [name] FTD:[id number] DOB: 1950/01/30 DOA: 09/09/2014 PCP: Elby Showers, MD  64 y.o. female with history of fibromyalgia, hypothyroidism, solitary kidney, hypothyroidism, diabetes, nonischemic cardiomyopathy with ejection fraction of 35%, atrial fibrillation who was admitted on 9/10 w/ recurrent Lower extremity cellulitis, immediately went into Sepstic shock Was in ICU on pressors until 9/14. Transferred to telemetry on 9/15.  Patient is on cefazolin from 9/14.  Clinically since then volume overloaded with anasarca and bring diuresed per Cards  Assessment/Plan: Septic shock due to Left lower leg cellulitis:  - abx cefazolin from 9/14 , in addition to IV vancomycin for 4 days , and ciprofloxacin/flagyl for 3 days.   - Keep lower extremity elevated.  - blood culture X 2 - 1/2 group B strep,  completed 14 days of antibiotics, between Vanc and ancef  - Ancef stopped 9/24 - Wound care   .Acute on  Combined systolic and diastolic congestive heart failure- EF 30-35% June 2014  -with volume overload, anasarca, third spacing due to severe hypoalbuminemia -was being diuresed with IV lasix per Cards, now changed to Po demadex and metolazone -creatinine stable, negative 17.7L -no ACE due to AKI -baseline dry weight unclear around 330lbs, still has some ways to go, unable to get a accurate weight everyday, despite multiple attempts per staff  RUE swelling -dopplers 9/22 negative, due to third spacing  . Type 2 diabetes mellitus:  - A1c 7.0  - lantus increased to 22 units.  - SSI while inpatient   . PAF (paroxysmal atrial fibrillation)  - Currently with a paced rhythm - Coumadin dosed by pharmacy, continue amiodarone   . Hypothyroidism, acquired, autoimmune: Recent TSH was 2.12 on 05/28/14.  - Continue levothyroxine     Gout: stable  -Continue Uloric  . CKD (chronic kidney disease) stage 4, GFR 15-29 ml/min: ccl slightly higher from baseline 2.5-3.00 to 3.38 on  admission  - improved to 2 range,  now starting to trend up some, monitor with diuresis  -Followed by Dr.Dunham, if creatinine continues to trend up will request Renal input given overloaded state  . PACEMAKER, PERMANENT   . Obesity hypoventilation syndrome  - CPAP QHS  . OBESITY, MORBID    Hypokalemia:  - Replete as needed.     Anemia: -due to CKD and acute illness and iron defi -baseline around 9, transfused 1unit PRBC 9/22  Code Status: full Family Communication: patient Disposition Plan: SNF when stable, hopefully sometime next week   Consultants:  PCCM  cards  Procedures:  none   HPI/Subjective: Feels so-so, uncomfortable, breathing stable Doesn't feel she loosing much fluid on demadex  Objective: Filed Vitals:   09/26/14 0610  BP: 90/49  Pulse: 88  Temp: 98.4 F (36.9 C)  Resp: 18    Intake/Output Summary (Last 24 hours) at 09/26/14 0820 Last data filed at 09/26/14 0623  Gross per 24 hour  Intake    240 ml  Output   1200 ml  Net   -960 ml   Filed Weights   09/24/14 0538 09/25/14 0700 09/26/14 0610  Weight: 156.264 kg (344 lb 8 oz) 158.26 kg (348 lb 14.4 oz) 158.895 kg (350 lb 4.8 oz)    Exam:   General:  Pleasant/A+Ox3, morbidly obese  Cardiovascular: irregular, s1s2.   Respiratory: clear, no wheezing. Good air entry bilateral.   Abdomen: obese, soft non tender non distended bowel sounds heard.   Musculoskeletal: 2-3+Edema, less redness on left leg   Skin: stage 3 sacral decubitus, .multiple  ulcers over the lower extremities.   Data Reviewed: Basic Metabolic Panel:  Recent Labs Lab 09/22/14 0500 09/23/14 0535 09/24/14 0645 09/25/14 0539 09/26/14 0523  NA 134* 132* 133* 131* 132*  K 4.9 4.5 4.4 4.6 4.3  CL 96 94* 96 93* 94*  CO2 28 26 27 26 25   GLUCOSE 139* 114* 131* 186* 154*  BUN 64* 64* 66* 71* 76*  CREATININE 2.10* 2.09* 2.20* 2.44* 2.63*  CALCIUM 7.9* 8.1* 8.2* 8.0* 7.9*   Liver Function Tests:  Recent Labs Lab  09/22/14 0500  AST 22  ALT <5  ALKPHOS 194*  BILITOT 0.7  PROT 6.4  ALBUMIN 1.6*   No results found for this basename: LIPASE, AMYLASE,  in the last 168 hours No results found for this basename: AMMONIA,  in the last 168 hours CBC:  Recent Labs Lab 09/21/14 0525 09/21/14 1330 09/22/14 0500 09/24/14 0645  WBC 8.0 10.0 7.6 5.6  HGB 7.3* 7.9* 8.5* 8.5*  HCT 22.5* 24.8* 26.5* 26.4*  MCV 97.8 100.4* 99.6 97.4  PLT 255 308 259 266   Cardiac Enzymes: No results found for this basename: CKTOTAL, CKMB, CKMBINDEX, TROPONINI,  in the last 168 hours BNP (last 3 results) No results found for this basename: PROBNP,  in the last 8760 hours CBG:  Recent Labs Lab 09/25/14 0645 09/25/14 1116 09/25/14 1626 09/25/14 2100 09/26/14 0609  GLUCAP 189* 186* 193* 208* 160*    Recent Results (from the past 240 hour(s))  CLOSTRIDIUM DIFFICILE BY PCR     Status: None   Collection Time    09/20/14  9:04 AM      Result Value Ref Range Status   C difficile by pcr NEGATIVE  NEGATIVE Final     Studies: No results found.  Scheduled Meds: . sodium chloride   Intravenous Once  . amiodarone  400 mg Oral Daily  . calcitRIOL  0.25 mcg Oral BID  . calcium-vitamin D  1 tablet Oral BID  . collagenase   Topical Daily  . febuxostat  80 mg Oral Daily  . gabapentin  300 mg Oral BID  . insulin aspart  0-15 Units Subcutaneous TID WC  . insulin aspart  0-5 Units Subcutaneous QHS  . insulin aspart  2 Units Subcutaneous TID WC  . insulin glargine  22 Units Subcutaneous QHS  . iron polysaccharides  150 mg Oral BID  . levothyroxine  125 mcg Oral QAC breakfast  . linagliptin  5 mg Oral Daily  . metolazone  2.5 mg Oral QODAY  . multivitamin with minerals  1 tablet Oral Daily  . mupirocin cream   Topical Daily  . potassium chloride  20 mEq Oral Daily  . saccharomyces boulardii  250 mg Oral BID  . sodium chloride  3 mL Intravenous Q12H  . torsemide  100 mg Oral BID  . 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   Obesity hypoventilation syndrome-    Cardiomyopathy, nonischemic- EF 30-35% June 2014   Type 2 diabetes mellitus   Chronic combined systolic and diastolic CHF   Long term (current) use of anticoagulants   CKD (chronic kidney disease) stage 4, GFR 15-29 ml/min   PAF- recurrent despite  Amiodarone and multiple cardioversions   Mitral insufficiency, moderate to severe   Chronic massive bilat LE lymphadema   Fall at home- 3 falls this summer, sound orthostatic   Hypotension   Acute on chronic renal failure    Time spent: 25 min    [facility] Pager [phone] 7PM-7AM, please contact night-coverage at [email or web address], password [facility] 09/26/2014, 8:20 AM  LOS: 17 days

## 2014-09-26 NOTE — Progress Notes (Signed)
ANTICOAGULATION CONSULT NOTE - Follow Up Consult  Pharmacy Consult:  Coumadin Indication: atrial fibrillation  Allergies  Allergen Reactions  . Ivp Dye [Iodinated Diagnostic Agents] Shortness Of Breath    Turn red, can't breathe  . Betadine [Povidone Iodine]   . Diphenhydramine Hcl Swelling    In hands and eyes  . Fish Allergy Nausea And Vomiting  . Iohexol      Desc: PT TURNS RED AND WHEEZING   . Penicillins Other (See Comments)    Resp arrest as child. Tolerates cephalosporins.  . Povidone-Iodine Other (See Comments)    Wheezing, turn red, can't breath, and blisters  . Promethazine Hcl Other (See Comments)    Low Blood Pressure  . Tape     Blisters, can use paper tape for short periods  . Clindamycin Hives and Rash    wheezing  . Iodine Rash  . Morphine Sulfate Nausea And Vomiting and Rash  . Primaxin [Imipenem] Rash    Patient Measurements: Height: 5' 6.93" (170 cm) Weight: 350 lb 4.8 oz (158.895 kg) IBW/kg (Calculated) : 61.44  Vital Signs: Temp: 98.4 F (36.9 C) (09/27 0610) Temp src: Oral (09/27 0610) BP: 90/49 mmHg (09/27 0610) Pulse Rate: 88 (09/27 0610)  Labs:  Recent Labs  09/24/14 0645 09/25/14 0539 09/26/14 0523  HGB 8.5*  --   --   HCT 26.4*  --   --   PLT 266  --   --   LABPROT 19.5* 20.7* 19.7*  INR 1.65* 1.78* 1.67*  CREATININE 2.20* 2.44* 2.63*    Estimated Creatinine Clearance: 33.4 ml/min (by C-G formula based on Cr of 2.63).  Assessment: [name] continues on Coumadin from PTA for history of Afib.  INR supra-therapeutic (3.4) on admit, then peaked at 5 likely due to drug-drug interactions with Cipro and Flagyl.  She then received Vitamin K 1mg  IV x 1 on 09/12/14.   She has required lower doses than her home regimen during this admission d/t high INRs. As a result of this, her INR dropped to slightly below goal today at 1.67.  It appears she will require a dose increase. No bleeding noted.  Goal of Therapy:  INR 2-3 Monitor platelets  by anticoagulation protocol: Yes  Plan:  Warfarin 5mg  po x1 tonight Daily PT/INR Follow for s/s bleeding  Legrand Como, Pharm.D., BCPS, AAHIVP Clinical Pharmacist Phone: [phone] or [phone] 09/26/2014, 2:36 PM

## 2014-09-27 DIAGNOSIS — L0291 Cutaneous abscess, unspecified: Secondary | ICD-10-CM

## 2014-09-27 DIAGNOSIS — L0390 Cellulitis, unspecified: Secondary | ICD-10-CM

## 2014-09-27 LAB — BASIC METABOLIC PANEL
Anion gap: 12 (ref 5–15)
BUN: 86 mg/dL — AB (ref 6–23)
CO2: 25 mEq/L (ref 19–32)
CREATININE: 2.8 mg/dL — AB (ref 0.50–1.10)
Calcium: 7.6 mg/dL — ABNORMAL LOW (ref 8.4–10.5)
Chloride: 93 mEq/L — ABNORMAL LOW (ref 96–112)
GFR calc Af Amer: 19 mL/min — ABNORMAL LOW (ref >90)
GFR, EST NON AFRICAN AMERICAN: 17 mL/min — AB (ref >90)
GLUCOSE: 170 mg/dL — AB (ref 70–99)
POTASSIUM: 4.2 meq/L (ref 3.7–5.3)
Sodium: 130 mEq/L — ABNORMAL LOW (ref 137–147)

## 2014-09-27 LAB — GLUCOSE, CAPILLARY
GLUCOSE-CAPILLARY: 243 mg/dL — AB (ref 70–99)
Glucose-Capillary: 180 mg/dL — ABNORMAL HIGH (ref 70–99)
Glucose-Capillary: 215 mg/dL — ABNORMAL HIGH (ref 70–99)
Glucose-Capillary: 203 mg/dL — ABNORMAL HIGH (ref 70–99)

## 2014-09-27 LAB — CBC
HCT: 22.1 % — ABNORMAL LOW (ref 36.0–46.0)
Hemoglobin: 7.3 g/dL — ABNORMAL LOW (ref 12.0–15.0)
MCH: 32 pg (ref 26.0–34.0)
MCHC: 33 g/dL (ref 30.0–36.0)
MCV: 96.9 fL (ref 78.0–100.0)
PLATELETS: 199 10*3/uL (ref 150–400)
RBC: 2.28 MIL/uL — ABNORMAL LOW (ref 3.87–5.11)
RDW: 17.8 % — ABNORMAL HIGH (ref 11.5–15.5)
WBC: 6.8 10*3/uL (ref 4.0–10.5)

## 2014-09-27 LAB — RETICULOCYTES
RBC.: 2.34 MIL/uL — AB (ref 3.87–5.11)
RETIC CT PCT: 1.9 % (ref 0.4–3.1)
Retic Count, Absolute: 44.5 10*3/uL (ref 19.0–186.0)

## 2014-09-27 LAB — VITAMIN B12: Vitamin B-12: 1180 pg/mL — ABNORMAL HIGH (ref 211–911)

## 2014-09-27 LAB — IRON AND TIBC
IRON: 48 ug/dL (ref 42–135)
SATURATION RATIOS: 30 % (ref 20–55)
TIBC: 162 ug/dL — AB (ref 250–470)
UIBC: 114 ug/dL — ABNORMAL LOW (ref 125–400)

## 2014-09-27 LAB — PROTIME-INR
INR: 1.59 — ABNORMAL HIGH (ref 0.00–1.49)
PROTHROMBIN TIME: 19 s — AB (ref 11.6–15.2)

## 2014-09-27 LAB — FERRITIN: FERRITIN: 632 ng/mL — AB (ref 10–291)

## 2014-09-27 LAB — FOLATE: Folate: 15.1 ng/mL

## 2014-09-27 MED ORDER — WARFARIN SODIUM 5 MG PO TABS
5.0000 mg | ORAL_TABLET | Freq: Once | ORAL | Status: AC
  Administered 2014-09-27: 5 mg via ORAL
  Filled 2014-09-27: qty 1

## 2014-09-27 MED ORDER — DEXTROSE 5 % IV SOLN
160.0000 mg | Freq: Two times a day (BID) | INTRAVENOUS | Status: DC
  Administered 2014-09-27 – 2014-10-06 (×17): 160 mg via INTRAVENOUS
  Filled 2014-09-27 (×20): qty 16

## 2014-09-27 NOTE — Progress Notes (Signed)
Physical Therapy Treatment Patient Details Name: [name] MRN: [id number] DOB: [birth year] Today's Date: 09/27/2014    History of Present Illness 64 y.o. female  with history of fibromyalgia, hypothyroidism, solitary kidney, hypothyroidism, diabetes, nonischemic cardiomyopathy with ejection fraction of 35%, atrial fibrillation who presented to the emergency department with left-sided lower leg pain. She was admitted on 9/10 w/ recurrent Lower extremity cellulitis. PCCM asked to see in consult on 9/11 for persistent hypotension and worsening renal failure.  She had a previous admission for LE cellulitis in 07/2014 followed by [facility].  Pt had returned home with HHPT prior to this admission.     PT Comments    Pt admitted with above. Pt currently with functional limitations due to balance and endurance deficits.  Pt will benefit from skilled PT to increase their independence and safety with mobility to allow discharge to the venue listed below.    Follow Up Recommendations  SNF;Supervision/Assistance - 24 hour     Equipment Recommendations  None recommended by PT    Recommendations for Other Services       Precautions / Restrictions Precautions Precautions: Fall Restrictions Weight Bearing Restrictions: No    Mobility  Bed Mobility Overal bed mobility: Needs Assistance;+2 for physical assistance Bed Mobility: Rolling Rolling: Max assist;+2 for physical assistance         General bed mobility comments: Placed bed in reverse trendelenberg to exercise pts LEs.  Pt able to push against bed and extend knees slightly.  Pt scooted up to the [facility] using trendelenberg after doing LEs exercises so that she would be positioned correctly.  +2 assist to assist up and down in bed using pads and gravity.    Transfers                    Ambulation/Gait                 Stairs            Wheelchair Mobility    Modified Rankin (Stroke Patients Only)        Balance                                    Cognition Arousal/Alertness: Awake/alert Behavior During Therapy: Anxious Overall Cognitive Status: Within Functional Limits for tasks assessed                      Exercises General Exercises - Upper Extremity Shoulder Flexion: AROM;Strengthening;Both;10 reps;Supine;Bar weights/barbell Bar Weights/Barbell (Shoulder Flexion): 1 lb Shoulder ABduction: AROM;Both;Strengthening;10 reps;Supine;Bar weights/barbell Bar Weights/Barbell (Shoulder Abduction): 1 lb Bar Weights/Barbell (Shoulder Horizontal Adduction): 1 lb Elbow Flexion: AROM;Both;10 reps;Supine;Bar weights/barbell Bar Weights/Barbell (Elbow Flexion): 1 lb Elbow Extension: AROM;Both;10 reps;Supine;Bar weights/barbell Bar Weights/Barbell (Elbow Extension): 1 lb General Exercises - Lower Extremity Ankle Circles/Pumps: AROM;Strengthening;Both;15 reps;Supine Quad Sets: AROM;Both;10 reps;Supine Gluteal Sets: AROM;Both;10 reps;Supine Straight Leg Raises: AAROM;Both;5 reps;Supine Other Exercises Other Exercises: Pt pushed against bed with bed in reverse trendelenberg with good contraction of hip and trunk extensors with verbal cues constantly.      General Comments General comments (skin integrity, edema, etc.): positioned to protect skin and edema.      Pertinent Vitals/Pain Pain Assessment: Faces Faces Pain Scale: Hurts little more Pain Location: bil LES Pain Descriptors / Indicators: Aching;Numbness;Tingling Pain Intervention(s): Monitored during session;Repositioned;Limited activity within patient's tolerance VSS    Home Living  Prior Function            PT Goals (current goals can now be found in the care plan section) Progress towards PT goals: Progressing toward goals    Frequency  Min 2X/week    PT Plan Current plan remains appropriate    Co-evaluation PT/OT/SLP Co-Evaluation/Treatment: Yes Reason for  Co-Treatment: For patient/therapist safety PT goals addressed during session: Strengthening/ROM OT goals addressed during session: Strengthening/ROM     End of Session   Activity Tolerance: Patient limited by fatigue;Patient limited by pain Patient left: in bed;with call bell/phone within reach;with family/visitor present     Time: 0388-8280 PT Time Calculation (min): 30 min  Charges:  $Therapeutic Exercise: 8-22 mins                    G Codes:      INGOLD,Almus Woodham October 04, 2014, 12:57 PM [facility] Acute Rehabilitation [phone] (pager)

## 2014-09-27 NOTE — Progress Notes (Signed)
Patient Name: [name] Date of Encounter: 09/27/2014     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   Obesity hypoventilation syndrome-    Cardiomyopathy, nonischemic- EF 30-35% June 2014   Type 2 diabetes mellitus   Chronic combined systolic and diastolic CHF   Long term (current) use of anticoagulants   CKD (chronic kidney disease) stage 4, GFR 15-29 ml/min   PAF- recurrent despite Amiodarone and multiple cardioversions   Mitral insufficiency, moderate to severe   Chronic massive bilat LE lymphadema   Fall at home- 3 falls this summer, sound orthostatic   Hypotension   Acute on chronic renal failure   Coarse tremors    SUBJECTIVE  Multiple complaints. Tremors not improved. Feels like "she was run over by a truck"   CURRENT MEDS . sodium chloride   Intravenous Once  . amiodarone  400 mg Oral Daily  . calcitRIOL  0.25 mcg Oral BID  . calcium-vitamin D  1 tablet Oral BID  . collagenase   Topical Daily  . febuxostat  80 mg Oral Daily  . insulin aspart  0-15 Units Subcutaneous TID WC  . insulin aspart  0-5 Units Subcutaneous QHS  . insulin aspart  2 Units Subcutaneous TID WC  . insulin glargine  22 Units Subcutaneous QHS  . iron polysaccharides  150 mg Oral BID  . levothyroxine  125 mcg Oral QAC breakfast  . linagliptin  5 mg Oral Daily  . metolazone  2.5 mg Oral QODAY  . multivitamin with minerals  1 tablet Oral Daily  . mupirocin cream   Topical Daily  . potassium chloride  20 mEq Oral Daily  . saccharomyces boulardii  250 mg Oral BID  . sodium chloride  3 mL Intravenous Q12H  . torsemide  100 mg Oral BID  . Warfarin - Pharmacist Dosing Inpatient   Does not apply q1800    OBJECTIVE  Filed Vitals:   09/26/14 0610 09/26/14 1700 09/26/14 2054 09/27/14 0419  BP: 90/49 98/34 85/47  92/45  Pulse: 88 97 88 98  Temp: 98.4 F (36.9 C) 99.1 F (37.3 C) 99.4 F  (37.4 C) 98.9 F (37.2 C)  TempSrc: Oral Oral Oral Oral  Resp: 18 19 18 18   Height:      Weight: 350 lb 4.8 oz (158.895 kg)   353 lb (160.12 kg)  SpO2: 91% 92% 92% 91%    Intake/Output Summary (Last 24 hours) at 09/27/14 0937 Last data filed at 09/27/14 0423  Gross per 24 hour  Intake    480 ml  Output   1200 ml  Net   -720 ml   Filed Weights   09/25/14 0700 09/26/14 0610 09/27/14 0419  Weight: 348 lb 14.4 oz (158.26 kg) 350 lb 4.8 oz (158.895 kg) 353 lb (160.12 kg)    PHYSICAL EXAM  General: Pleasant/A+Ox3, morbidly obese  Cardiovascular: irregular, s1s2.  Respiratory: clear, no wheezing. Good air entry bilateral.  Abdomen: obese, soft non tender non distended bowel sounds heard.  Musculoskeletal: 2-3+Edema, less redness on left leg  Skin: stage 3 sacral decubitus, .multiple ulcers over the lower extremities.    Accessory Clinical Findings  CBC  Recent Labs  09/27/14 0529  WBC 6.8  HGB 7.3*  HCT 22.1*  MCV 96.9  PLT 144   Basic Metabolic Panel  Recent Labs  09/26/14 0523 09/27/14 0529  NA 132* 130*  K 4.3 4.2  CL 94* 93*  CO2 25 25  GLUCOSE 154* 170*  BUN 76* 86*  CREATININE 2.63* 2.80*  CALCIUM 7.9* 7.6*    TELE  afib with intermittent AV pacing  Radiology/Studies  Dg Chest Port 1 View  09/14/2014   CLINICAL DATA:  Airspace disease.  EXAM: PORTABLE CHEST - 1 VIEW  COMPARISON:  09/13/2014.  FINDINGS: Support apparatus: LEFT IJ central line unchanged with the tip in the upper to mid SVC. Pacemaker leads appear unchanged.  Cardiomediastinal Silhouette:  Enlarged, unchanged.  Lungs: Mild improvement in pulmonary edema. Pulmonary vascular congestion persists. Subsegmental atelectasis the LEFT mid lung. No focal consolidation. No pneumothorax.  Effusions:  None.  Other:  None.  IMPRESSION: 1. Stable support apparatus. 2. Unchanged cardiomegaly. 3. Improved central pulmonary edema with persistent pulmonary vascular congestion.   Electronically Signed    By: Dereck Ligas M.D.   On: 09/14/2014 08:10   Dg Chest Port 1 View  09/13/2014   CLINICAL DATA:  Assess edema  EXAM: PORTABLE CHEST - 1 VIEW  COMPARISON:  September 12, 2014  FINDINGS: The heart size and mediastinal contours are stable. The heart size is enlarged. Left central venous line is identified with distal tip in the superior vena cava. Cardiac pacemaker is unchanged. There is enlargement of central pulmonary vessels worse compared to prior exam. There is no focal pneumonia or pleural effusion. The visualized skeletal structures are stable.  IMPRESSION: Slight interval worsened central pulmonary vascular congestion. There is no focal pneumonia or pleural effusion. Cardiomegaly.   Electronically Signed   By: Abelardo Diesel M.D.   On: 09/13/2014 07:31   Dg Chest Port 1 View  09/12/2014   CLINICAL DATA:  Evaluate pulmonary edema.  EXAM: PORTABLE CHEST - 1 VIEW  COMPARISON:  Chest x-ray 09/10/2014.  FINDINGS: There is a left-sided internal jugular central venous catheter with tip terminating in the mid superior vena cava. Right-sided pacemaker device in position with lead tips projecting over the expected location of the right atrium and right ventricular apex. Lung volumes are normal. No consolidative airspace disease. No pleural effusions. Cephalization of the pulmonary vasculature, without frank pulmonary edema. Moderate cardiomegaly. The patient is rotated to the right on today's exam, resulting in distortion of the mediastinal contours and reduced diagnostic sensitivity and specificity for mediastinal pathology. Atherosclerosis in the thoracic aorta.  IMPRESSION: 1. Support apparatus, as above. 2. Cardiomegaly with pulmonary venous congestion, but no frank pulmonary edema. 3. Atherosclerosis.   Electronically Signed   By: Vinnie Langton M.D.   On: 09/12/2014 08:25   Dg Chest Port 1 View  09/10/2014   CLINICAL DATA:  Line placement  EXAM: PORTABLE CHEST - 1 VIEW  COMPARISON:  Portable exam 0254  hr compared to 06/03/2013  FINDINGS: RIGHT subclavian pacemaker leads project over RIGHT atrium and RIGHT ventricle, unchanged.  New LEFT subclavian central venous catheter with tip projecting over mid to distal SVC.  Enlargement of cardiac silhouette with slight pulmonary vascular congestion.  No gross infiltrate, pleural effusion or pneumothorax.  Bones unremarkable.  IMPRESSION: No pneumothorax following central line placement.  Enlargement of cardiac silhouette with pulmonary vascular congestion.   Electronically Signed   By: Lavonia Dana M.D.   On: 09/10/2014 17:05   Dg Tibia/fibula Left Port  09/10/2014   CLINICAL DATA:  Edema and cellulitis  EXAM: PORTABLE LEFT TIBIA AND FIBULA - 2 VIEW  COMPARISON:  None.  FINDINGS: Frontal and lateral views were obtained.  There is marked soft tissue swelling. No fracture or dislocation. No erosive change or bony destruction. There is no evidence of soft tissue air.  IMPRESSION: Diffuse soft tissue edema/swelling. No soft tissue air is seen. No erosive change or bony destruction. No fracture or dislocation.  If there remains concern for potential soft tissue abscess for subtle air, CT would be the imaging study of choice to further assess.   Electronically Signed   By: Lowella Grip M.D.   On: 09/10/2014 17:07    ASSESSMENT AND PLAN  64 y.o. female with history of fibromyalgia, hypothyroidism, solitary kidney, hypothyroidism, diabetes, nonischemic cardiomyopathy with ejection fraction of 35%, atrial fibrillation who was admitted on 9/10 w/ recurrent Lower extremity cellulitis, immediately went into septic shock. Was in ICU on pressors until 9/14. Transferred to telemetry on 9/15. Patient is on cefazolin from 9/14. Clinically since then volume overloaded with anasarca and bring diuresed per Cards   Acute on Chronic Systolic HF: still significantly volume overloaded with increased RUE edema yesterday. Anasarca -- Crea continues to increase 2.2 --> 2.4-->2.6-->  2.8 with torsemide and metolazone.  -- She refuses HD.  -- The patient is adamant about HD. Diuretics don't seem to work and her Creat is worsening. She has low protein and is third spacing fluid into subcutaneous tissues. On metalazone every other day and torsemide 100mg  BID -- Net neg 18L. Weights all over the board. But weight increased in past couple days 343--> 353. -- IM has consulted nephrology for discussion about further management.   Persistent A-fib: rate controlled. Continue amiodarone, but reduced to once daily dosing yesterday.  -- AC with coumadin. INR 1.59 today. Coumadin was previously help due to anemia but then restarted s/p transfusion. Now Hg 7.3 again -- Dosing per pharmacy  Cellulitis of the LEE: she has completed course of antibiotics.   Anemia: s/p transfusion. Hgb went from 7.3 to 8.5. Now trending downwards to 7.3. Consider another transfusion.  CKD: Scr has increased from 2.2--> 2.8. Continue to monitor.   Tremors: patient complaining of significant tremor since being in ICU. Gabapentin was recently started and can cause tremor. Dr Meda Coffee recommended this be discontinued. This was stopped yesterday with no significant improvement in her tremors.    Judy Pimple PA-C  Pager [phone]   Personally seen and examined. Agree with above. Challenging situation.  Anemia worsening. Creat worsening. Continues to be volume overloaded. Agree with continued aggressive diuresis. Consider change back to IV. Will be interested in hearing nephrology perspective.    Candee Furbish, MD

## 2014-09-27 NOTE — Progress Notes (Addendum)
Daily dressing change unable to be done, oncoming RN aware. Pt transferred from 2w32 to 2w14. Sherrie Mustache

## 2014-09-27 NOTE — Progress Notes (Signed)
Occupational Therapy Treatment Patient Details Name: [name] MRN: [id number] DOB: 1950-09-09 Today's Date: 09/27/2014    History of present illness 64 y.o. female  with history of fibromyalgia, hypothyroidism, solitary kidney, hypothyroidism, diabetes, nonischemic cardiomyopathy with ejection fraction of 35%, atrial fibrillation who presented to the emergency department with left-sided lower leg pain. She was admitted on 9/10 w/ recurrent Lower extremity cellulitis. PCCM asked to see in consult on 9/11 for persistent hypotension and worsening renal failure.  She had a previous admission for LE cellulitis in 07/2014 followed by [facility].  Pt had returned home with HHPT prior to this admission.    OT comments  Pt tolerated exercise well, but does fatigue.  Recommend pt move to room with [facility] lift in order to potentially transfer pt OOB - will require significant amount of encouragement and coaxing.   Follow Up Recommendations  SNF;Supervision/Assistance - 24 hour    Equipment Recommendations  None recommended by OT    Recommendations for Other Services      Precautions / Restrictions Precautions Precautions: Fall Restrictions Weight Bearing Restrictions: No       Mobility Bed Mobility Overal bed mobility: Needs Assistance;+2 for physical assistance Bed Mobility: Rolling Rolling: Max assist;+2 for physical assistance            Transfers                      Balance                                   ADL                                         General ADL Comments: Pt only willing to participate in bed level exercises due to increased edema Lt. LE.  She performed bil. UE exercise with 1lb weight and manually resisted exercise.  In conjunction with PT, pt placed in reverse trendelenburg, pt worked on pressing feet into foot rest of bed and performing "modified squats" with assist.  SHe requires +2 assist for positionng, and  encouragement       Vision                     Perception     Praxis      Cognition   Behavior During Therapy: Anxious Overall Cognitive Status: Within Functional Limits for tasks assessed                       Extremity/Trunk Assessment               Exercises General Exercises - Upper Extremity Shoulder Flexion: AROM;Strengthening;Both;10 reps;Supine;Bar weights/barbell Bar Weights/Barbell (Shoulder Flexion): 1 lb Shoulder ABduction: AROM;Both;Strengthening;10 reps;Supine;Bar weights/barbell Bar Weights/Barbell (Shoulder Abduction): 1 lb Bar Weights/Barbell (Shoulder Horizontal Adduction): 1 lb Elbow Flexion: AROM;Both;10 reps;Supine;Bar weights/barbell Bar Weights/Barbell (Elbow Flexion): 1 lb Elbow Extension: AROM;Both;10 reps;Supine;Bar weights/barbell Bar Weights/Barbell (Elbow Extension): 1 lb Other Exercises Other Exercises: manually resisted shoulder press bil UEs with moderate manual resistance.  Pt fatigues.    Shoulder Instructions       General Comments      Pertinent Vitals/ Pain       Pain Assessment: Faces Faces Pain Scale: Hurts little more Pain Location: bil. LEs Pain Descriptors /  Indicators: Aching;Numbness;Tingling Pain Intervention(s): Monitored during session;Repositioned  Home Living                                          Prior Functioning/Environment              Frequency Min 2X/week     Progress Toward Goals  OT Goals(current goals can now be found in the care plan section)  Progress towards OT goals: Progressing toward goals  ADL Goals Pt Will Perform Grooming: with min assist;sitting Pt Will Perform Upper Body Bathing: with min assist;bed level Pt Will Perform Lower Body Bathing: with mod assist;bed level Additional ADL Goal #1: Pt will tolerate EOB sitting x 8 mins with min guard assist Additional ADL Goal #2: Pt will move sit to stand with mod A +2 in prep for toilet transfers.    Plan Discharge plan remains appropriate;Other (comment) (dicussed moving pt to room with [facility])    Co-evaluation    PT/OT/SLP Co-Evaluation/Treatment: Yes Reason for Co-Treatment: Complexity of the patient's impairments (multi-system involvement);For patient/therapist safety   OT goals addressed during session: Strengthening/ROM      End of Session     Activity Tolerance Patient tolerated treatment well   Patient Left in bed;with call bell/phone within reach;with bed alarm set;with family/visitor present   Nurse Communication Mobility status        Time: 4970-2637 OT Time Calculation (min): 30 min  Charges: OT General Charges $OT Visit: 1 Procedure OT Treatments $Therapeutic Exercise: 8-22 mins  Angalena Cousineau M 09/27/2014, 11:58 AM

## 2014-09-27 NOTE — Progress Notes (Signed)
Patient refuses CPAP 

## 2014-09-27 NOTE — Consult Note (Signed)
Reason for Consult: Volume status management  in setting of CKD Stage IV with hypoalbuminemia and CHF  Referring Physician:  Dr. Mitchel Honour HPI: [name] is an 64 y.o. 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   She was hospitalized for sepsis due to left LE cellulitis from 7/24-8/4 and then went to SNF until 8/19. She was readmitted on 9/10 for septic shock due to recurrent left LE cellulitis. She received pressor support (and IVF's as needed) in the ICU and then transferred back to the unit on 9/15 where she was found to have anasarca. She has at baseline chronic LE edema (per sister her legs are double in size) and was at home on torsemide 100 mg AM/50 mg PM and metolazone 2.5 mg PRN for volume overload. Her dry weight is approx 330 lb (in May 2015). She has been followed by cardiology in the setting of chronic CHF with EF 35-40% on recent 2D-echo due to NICM with ICD in place. She was started on IV lasix 80 mg BID on 9/14 which she received to 9/25. She received metolazone (2.5 mg) on 9/20, 9/25, and 9/27 which she is currently on every other day dosing. She was also started on PO torsemide 100 mg BID from 9/26 to current. Of note she received corticosteroids (hydrocortisone from 9/12-17) during ICU stay.    She follows with Dr. Eliott Nine with [facility]. 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 On admission her Cr was 3.38 which improved on IV lasix from 3.53 to 2.20. After starting metolazone and torsemide it has increased to 2.8 today. She  continues to be volume overloaded despite net -18 L since admission and weight above baseline (on admission (304 lb?) to 353 lb (up from baseline 330 lb). Her weight since stopping IV lasix has increased from 344 to 353 lbs. She continues to make adequate urine output. Last UA on 9/11 revealed no protein or blood. Last CT on 07/28/14 revealed probable cyst in lower pole of the right kidney (1.1 cm). She has chronic hypoalbuminemia with baseline 2-2.5, recently lower at 1.6 during current hospitalization.   She continues to have anasarca (bilateral UE and LE edema up to thighs) but denies dyspnea, nausea, vomiting, flank/abdominal pain, or urinary symptoms. She reports having new onset tremors since hospitalization.   PMH:   Past Medical History  Diagnosis Date  . Personal history of other diseases of circulatory system   . 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 Chronic lymphocytic thyroiditis   . Morbid obesity   . Other chronic pulmonary heart diseases   . Obstructive sleep apnea (adult) (pediatric)   . Other and unspecified hyperlipidemia   . Fibromyalgia   . Goiter   . Thyroiditis, autoimmune   . Hypothyroidism, acquired, autoimmune   . Solitary kidney, acquired   . Fatigue   . Hyperparathyroidism, secondary   . Vitamin D deficiency disease   . Hyperparathyroidism   . H/O varicose veins 03/27/12  . Type II or unspecified type diabetes mellitus without mention of complication, not  stated as uncontrolled   . Diabetes mellitus type II   . Infections of kidney 03/27/12  . History of measles, mumps, or rubella 03/27/12  . Tubal pregnancy 12/23/76  . Unspecified essential hypertension     20 yrs ago  . Blood transfusion 1977    [facility]  . Complication of anesthesia 2010    hard to wake up after knee surgery  . Anemia   . Cardiomyopathy, nonischemic     EF 45 %  . Endometrial hyperplasia with atypia 05/05/12  . Diastolic HF (heart failure) 11/05/2012  . A-fib   .  Echocardiogram abnormal 07/15/12    severe MR,mod to severe TR,mod to severe PA hypertension  . Pulmonary arterial hypertension   . PAF (paroxysmal atrial fibrillation), 1st episode since DCCV in Jan/14 03/19/2013  . Ulcers of both lower extremities, small, now with cellulitis 06/05/2013    PSH:   Past Surgical History  Procedure Laterality Date  . Cholecystectomy    . Pacemaker placement  02/26/11    Medtronic Adapta  . Knee surgery    . Resection duplicate left kidney    . 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 Hernia repair  1999    ruptured ;  . D& c with hysteroscopy & cervical polypectomy  05/05/12  . Cardioversion N/A 02/06/2013    Successful  . Iud removal  01/30/13  . Ectopic pregnancy surgery  1978  . Cardioversion N/A 06/08/2013    Procedure: CARDIOVERSION;  Surgeon: Thurmon Fair, MD;  Location: [facility] OR;  Service: Cardiovascular;  Laterality: N/A;  Room [id number]  . Cardioversion N/A 02/01/2014    Procedure: CARDIOVERSION;  Surgeon: Thurmon Fair, MD;  Location: [facility] ENDOSCOPY;  Service: Cardiovascular;  Laterality: N/A;  . Cardioversion N/A 06/02/2014    Procedure: CARDIOVERSION;  Surgeon: Thurmon Fair, MD;  Location: [facility] ENDOSCOPY;  Service: Cardiovascular;  Laterality: N/A;    Allergies:  Allergies  Allergen Reactions  . Ivp Dye [Iodinated Diagnostic Agents] Shortness Of Breath    Turn red, can't breathe  . Betadine [Povidone Iodine]   . Diphenhydramine Hcl Swelling    In hands and eyes  . Fish Allergy Nausea And Vomiting  . Iohexol      Desc: PT TURNS RED AND WHEEZING   . Penicillins Other (See Comments)    Resp arrest as child. Tolerates cephalosporins.  . Povidone-Iodine Other (See Comments)    Wheezing, turn red, can't breath, and blisters  . Promethazine Hcl Other (See Comments)    Low Blood Pressure  . Tape     Blisters, can use paper tape for short periods  . Clindamycin Hives and Rash    wheezing  . Iodine Rash  . Morphine Sulfate Nausea And  Vomiting and Rash  . Primaxin [Imipenem] Rash    Medications:   Prior to Admission medications   Medication Sig Start Date End Date Taking? Authorizing Provider  albuterol (PROVENTIL HFA;VENTOLIN HFA) 108 (90 BASE) MCG/ACT inhaler Inhale 2 puffs into the lungs every 6 (six) hours as needed for wheezing or shortness of breath.    Yes Historical Provider, MD  albuterol (PROVENTIL) (2.5 MG/3ML) 0.083% nebulizer solution Take 2.5 mg by nebulization every 6 (six) hours as needed for wheezing or shortness of breath.    Yes Historical Provider, MD  amiodarone (PACERONE) 200 MG tablet Take 2 tablets (400 mg total) by mouth 2 (two) times daily. 07/08/14  Yes Mihai Croitoru, MD  calcitRIOL (ROCALTROL) 0.25 MCG capsule Take 0.25  mcg by mouth 2 (two) times daily.   Yes Historical Provider, MD  calcium citrate-vitamin D 200-200 MG-UNIT TABS Take 1 tablet by mouth 4 (four) times daily.    Yes Historical Provider, MD  camphor-menthol Timoteo Ace) lotion Apply 1 application topically daily as needed for itching. 08/03/14  Yes Janece Canterbury, MD  collagenase (SANTYL) ointment Apply 1 application topically daily. Bilateral medial thigh ulcers. 08/03/14  Yes Janece Canterbury, MD  doxycycline (VIBRAMYCIN) 100 MG capsule Take 1 capsule (100 mg total) by mouth 2 (two) times daily. 09/02/14  Yes Elby Showers, MD  glyBURIDE (DIABETA) 5 MG tablet TAKE 1-2 TABLETS TWICE DAILY WITH A MEAL. TAKE $RemoveBe'10MG'[id number]$  (2 TABS) IN THE A.M. AND $Remove'5MG'JaGzBpA$ (1 TAB) AT NIGHT. 07/01/14  Yes Sherrlyn Hock, MD  HYDROcodone-acetaminophen [facility]) 10-325 MG per tablet Take 1 tablet by mouth every 4 (four) hours as needed for moderate pain.  06/29/14  Yes Historical Provider, MD  hydrOXYzine (ATARAX/VISTARIL) 25 MG tablet Take 1 tablet (25 mg total) by mouth every 4 (four) hours as needed for itching. 08/03/14  Yes Janece Canterbury, MD  insulin glargine (LANTUS) 100 UNIT/ML injection Inject 27 Units into the skin at bedtime.  07/03/13  Yes Sherrlyn Hock, MD  iron  polysaccharides (NIFEREX) 150 MG capsule Take 150 mg by mouth 2 (two) times daily.   Yes Historical Provider, MD  levothyroxine (SYNTHROID, LEVOTHROID) 125 MCG tablet Take 1 tablet (125 mcg total) by mouth daily before breakfast. 06/02/14  Yes Mihai Croitoru, MD  metolazone (ZAROXOLYN) 5 MG tablet Take 2.5 mg by mouth daily as needed (for weight gain). If up 4-5 lbs. 03/12/14  Yes Mihai Croitoru, MD  metoprolol tartrate (LOPRESSOR) 25 MG tablet Take 12.5 mg by mouth 2 (two) times daily. 08/03/14  Yes Janece Canterbury, MD  Multiple Vitamin (MULTIVITAMIN) tablet Take 1 tablet by mouth daily.    Yes Historical Provider, MD  mupirocin ointment (BACTROBAN) 2 % Apply topically 2 (two) times daily. Apply to lower extremity ulcers not being treated with hydrotherapy 08/03/14  Yes Janece Canterbury, MD  nitroGLYCERIN (NITROSTAT) 0.4 MG SL tablet Place 0.4 mg under the tongue every 5 (five) minutes as needed. For chest pain   Yes Historical Provider, MD  saccharomyces boulardii (FLORASTOR) 250 MG capsule Take 250 mg by mouth 2 (two) times daily. 08/03/14  Yes Janece Canterbury, MD  sitaGLIPtin (JANUVIA) 100 MG tablet Take 100 mg by mouth daily.   Yes Historical Provider, MD  torsemide (DEMADEX) 100 MG tablet Take 50-100 mg by mouth 2 (two) times daily. 100 mg in the am and 50 mg in the pm 03/08/14  Yes Mihai Croitoru, MD  ULORIC 40 MG tablet Take 80 mg by mouth daily. Take two tablet 05/21/14  Yes Historical Provider, MD  Vitamin D, Ergocalciferol, (DRISDOL) 50000 UNITS CAPS capsule Take 50,000 Units by mouth 2 (two) times a week. Takes on Tuesdays and Fridays 03/09/14 03/10/15 Yes Sherrlyn Hock, MD  VOLTAREN 1 % GEL Apply 2 g topically 4 (four) times daily as needed (for pain).  03/11/14  Yes Historical Provider, MD  warfarin (COUMADIN) 5 MG tablet Take 2.5-5 mg by mouth See admin instructions. Takes 5 mg on Sunday, Tuesday, Thursday and Saturday and 2.5 mg all other days 01/20/14  Yes Tommy Medal, RPH-CPP  ALPRAZolam  Duanne Moron) 0.25 MG tablet  07/23/14   Historical Provider, MD  BD PEN NEEDLE NANO U/F 32G X 4 MM MISC USE AS DIRECTED 08/23/14   Sherrlyn Hock, MD  glucose  blood (ONE TOUCH ULTRA TEST) test strip Test 3 times daily 03/09/14   Sherrlyn Hock, MD  Insulin Pen Needle (B-D UF III MINI PEN NEEDLES) 31G X 5 MM MISC Check blood sugar up to 6x daily 08/11/13   Sherrlyn Hock, MD    Discontinued Meds:   Medications Discontinued During This Encounter  Medication Reason  . hydrocortisone cream 1 % Entry Error  . nitroGLYCERIN (NITRODUR - DOSED IN MG/24 HR) 0.4 mg/hr patch Entry Error  . metoprolol tartrate (LOPRESSOR) 25 MG tablet   . camphor-menthol (SARNA) lotion   . collagenase (SANTYL) ointment   . saccharomyces boulardii (FLORASTOR) 250 MG capsule   . albuterol (PROVENTIL HFA;VENTOLIN HFA) 108 (90 BASE) MCG/ACT inhaler 2 puff Formulary change  . calcium citrate-vitamin D 200-200 MG-UNIT per tablet 1 tablet Formulary change  . multivitamin tablet 1 tablet Formulary change  . metoprolol tartrate (LOPRESSOR) tablet 12.5 mg   . HYDROcodone-acetaminophen (NORCO) 10-325 MG per tablet 1 tablet   . metoprolol tartrate (LOPRESSOR) tablet 12.5 mg   . insulin aspart (novoLOG) injection 0-15 Units   . phenylephrine (NEO-SYNEPHRINE) 10 mg in dextrose 5 % 250 mL (0.04 mg/mL) infusion   . insulin glargine (LANTUS) injection 20 Units   . insulin glargine (LANTUS) injection 10 Units   . ciprofloxacin (CIPRO) IVPB 400 mg   . metroNIDAZOLE (FLAGYL) IVPB 500 mg   . sodium chloride 0.9 % injection 3 mL   . hydrocortisone sodium succinate (SOLU-CORTEF) 100 MG injection 50 mg   . insulin aspart (novoLOG) injection 0-15 Units   . levofloxacin (LEVAQUIN) IVPB 500 mg   . vancomycin (VANCOCIN) 1,750 mg in sodium chloride 0.9 % 500 mL IVPB   . phenylephrine (NEO-SYNEPHRINE) 40 mg in dextrose 5 % 250 mL (0.16 mg/mL) infusion   . insulin glargine (LANTUS) injection 10 Units   . vasopressin (PITRESSIN) 40 Units in  sodium chloride 0.9 % 250 mL (0.16 Units/mL) infusion   . hydrocortisone sodium succinate (SOLU-CORTEF) 100 MG injection 50 mg   . furosemide (LASIX) injection 80 mg   . insulin aspart (novoLOG) injection 0-15 Units   . insulin glargine (LANTUS) injection 15 Units   . hydrocortisone sodium succinate (SOLU-CORTEF) 100 MG injection 50 mg   . predniSONE (DELTASONE) tablet 40 mg   . metolazone (ZAROXOLYN) tablet 2.5 mg   . HYDROcodone-acetaminophen (NORCO) 10-325 MG per tablet 1 tablet   . insulin glargine (LANTUS) injection 25 Units   . gabapentin (NEURONTIN) tablet 300 mg Formulary change  . potassium chloride (K-DUR) CR tablet 40 mEq   . ceFAZolin (ANCEF) IVPB 2 g/50 mL premix   . insulin glargine (LANTUS) injection 20 Units   . mupirocin ointment (BACTROBAN) 2 %   . mupirocin cream (BACTROBAN) 2 %   . collagenase (SANTYL) ointment   . HYDROcodone-acetaminophen (NORCO) 10-325 MG per tablet 2 tablet   . glyBURIDE (DIABETA) tablet 5 mg   . feeding supplement (PRO-STAT SUGAR FREE 64) liquid 30 mL   . furosemide (LASIX) injection 40 mg   . furosemide (LASIX) injection 80 mg   . potassium chloride (K-DUR) CR tablet 40 mEq   . amiodarone (PACERONE) tablet 400 mg   . furosemide (LASIX) injection 80 mg   . furosemide (LASIX) injection 80 mg   . furosemide (LASIX) injection 80 mg   . gabapentin (NEURONTIN) capsule 300 mg       Family History:   Family History  Problem Relation Age of Onset  . Cardiomyopathy Brother   .  Diabetes Mother   . Cancer Maternal Grandmother   . Heart attack Mother     Social History:  reports that she has never smoked. She has never used smokeless tobacco. She reports that she does not drink alcohol or use illicit drugs. Review of Systems  Constitutional: Positive for malaise/fatigue. Negative for fever and chills.  Respiratory: Negative for cough, shortness of breath and wheezing.   Cardiovascular: Positive for leg swelling (up to thighs b/l). Negative  for chest pain.  Gastrointestinal: Negative for nausea, vomiting, abdominal pain, diarrhea, constipation and blood in stool.  Genitourinary: Negative for dysuria, urgency and frequency.       Foley catheter in place  Musculoskeletal: Positive for myalgias (Left LE).  Skin: Positive for rash (Improved cellulitis of left LE).  Neurological: Positive for tremors.    Blood pressure 92/45, pulse 98, temperature 98.9 F (37.2 C), temperature source Oral, resp. rate 18, height 5' 6.93" (1.7 m), weight 353 lb (160.12 kg), SpO2 91.00%.  Physical Exam  Constitutional: She is oriented to person, place, and time. She appears well-developed and well-nourished. No distress.  HENT:  Head: Normocephalic and atraumatic.  Eyes: EOM are normal.  Neck: Normal range of motion. Neck supple. JVD present.  Cardiovascular: Normal rate.   Murmur heard. Irregularly irregular rhythm  Respiratory: Effort normal. No respiratory distress. She has no wheezes. She has no rales.  Decreased breath sounds  GI: Soft. Bowel sounds are normal. She exhibits no distension. There is no tenderness. There is no rebound and no guarding.  Musculoskeletal: Normal range of motion. She exhibits edema (+3-4 b/l LE edema with sacral edema and upper UE b/l) and tenderness (Left LE).  Neurological: She is alert and oriented to person, place, and time.  Tremors present  Skin: Skin is warm and dry. She is not diaphoretic. There is erythema (left LE).  Psychiatric: She has a normal mood and affect. Her behavior is normal. Judgment and thought content normal.    Creat  Date/Time Value Ref Range Status  05/28/2014 12:01 AM 2.10* 0.50 - 1.10 mg/dL Final  03/30/2014  5:20 PM 2.37* 0.50 - 1.10 mg/dL Final  03/18/2014  7:07 AM 2.59* 0.50 - 1.10 mg/dL Final  03/12/2014 11:10 AM 2.50* 0.50 - 1.10 mg/dL Final     Result repeated and verified.  03/11/2014  2:09 PM 2.35* 0.50 - 1.10 mg/dL Corrected           Amended report.  01/28/2014  8:11 AM  1.96* 0.50 - 1.10 mg/dL Final  11/30/2013  9:42 AM 1.69* 0.50 - 1.10 mg/dL Final  10/05/2013  5:28 PM 1.82* 0.50 - 1.10 mg/dL Final  07/20/2013  7:30 AM 2.38* 0.50 - 1.10 mg/dL Final  10/27/2012  8:00 AM 1.60* 0.50 - 1.10 mg/dL Final  04/10/2012  5:40 PM 1.19* 0.50 - 1.10 mg/dL Final  03/24/2012 12:12 PM 1.34* 0.50 - 1.10 mg/dL Final  09/27/2011  1:49 PM 1.38* 0.50 - 1.10 mg/dL Final     Please note change in reference range(s).            Creatinine  Date/Time Value Ref Range Status  02/12/2011 10:00 AM 1.28* 0.4 - 1.2 mg/dL Final     Creatinine, Ser  Date/Time Value Ref Range Status  09/27/2014  5:29 AM 2.80* 0.50 - 1.10 mg/dL Final  09/26/2014  5:23 AM 2.63* 0.50 - 1.10 mg/dL Final  09/25/2014  5:39 AM 2.44* 0.50 - 1.10 mg/dL Final  09/24/2014  6:45 AM 2.20* 0.50 - 1.10 mg/dL  Final  09/23/2014  5:35 AM 2.09* 0.50 - 1.10 mg/dL Final  05/25/9573  8:24 AM 2.10* 0.50 - 1.10 mg/dL Final  2/80/7226  9:19 PM 2.06* 0.50 - 1.10 mg/dL Final  08/04/8787  8:83 AM 1.90* 0.50 - 1.10 mg/dL Final  8/95/3615  3:31 AM 2.05* 0.50 - 1.10 mg/dL Final  09/22/9196  6:94 AM 2.14* 0.50 - 1.10 mg/dL Final  05/08/2010  6:70 AM 2.34* 0.50 - 1.10 mg/dL Final  1/59/7611  1:65 AM 2.52* 0.50 - 1.10 mg/dL Final  9/78/5940  1:41 AM 2.80* 0.50 - 1.10 mg/dL Final  1/50/2103  3:05 AM 2.83* 0.50 - 1.10 mg/dL Final  1/57/3677  9:57 AM 3.18* 0.50 - 1.10 mg/dL Final  3/46/1247  1:52 AM 3.53* 0.50 - 1.10 mg/dL Final  6/74/2284  2:54 AM 3.99* 0.50 - 1.10 mg/dL Final  8/61/3981  7:32 PM 3.87* 0.50 - 1.10 mg/dL Final  9/31/9211  3:26 AM 3.66* 0.50 - 1.10 mg/dL Final  0/77/0963  5:40 PM 3.38* 0.50 - 1.10 mg/dL Final  7/0/7707  3:95 AM 2.60* 0.50 - 1.10 mg/dL Final  01/03/6397  0:81 AM 2.57* 0.50 - 1.10 mg/dL Final  04/05/4619  1:20 AM 2.77* 0.50 - 1.10 mg/dL Final  03/06/9285  2:87 AM 3.00* 0.50 - 1.10 mg/dL Final  0/48/2478  1:45 AM 2.97* 0.50 - 1.10 mg/dL Final  9/71/5523  8:75 AM 2.76* 0.50 - 1.10 mg/dL Final  8/32/3192  4:77 AM  2.80* 0.50 - 1.10 mg/dL Final  8/89/9354  1:13 AM 2.72* 0.50 - 1.10 mg/dL Final  02/04/9951  9:15 AM 2.68* 0.50 - 1.10 mg/dL Final  9/53/8199  7:10 PM 2.54* 0.50 - 1.10 mg/dL Final  0/89/5871  6:28 AM 2.37* 0.50 - 1.10 mg/dL Final  4/45/5769  3:82 AM 2.18* 0.50 - 1.10 mg/dL Final  0/97/4100  5:65 PM 2.20* 0.50 - 1.10 mg/dL Final  7/88/5807 65:19 AM 2.19* 0.50 - 1.10 mg/dL Final  0/71/7269  1:36 AM 2.57* 0.50 - 1.10 mg/dL Final  04/06/2551  9:84 AM 2.61* 0.50 - 1.10 mg/dL Final  07/10/5658  6:44 AM 2.30* 0.50 - 1.10 mg/dL Final  03/06/6727  6:26 AM 2.40* 0.50 - 1.10 mg/dL Final  0/0/4879  4:96 AM 2.45* 0.50 - 1.10 mg/dL Final  0/05/1003  1:78 AM 2.59* 0.50 - 1.10 mg/dL Final  01/05/4146  8:45 AM 2.50* 0.50 - 1.10 mg/dL Final  04/02/1238  3:11 AM 2.29* 0.50 - 1.10 mg/dL Final  01/04/1630  1:89 AM 2.21* 0.50 - 1.10 mg/dL Final  01/08/3559  9:01 PM 2.12* 0.50 - 1.10 mg/dL Final  2/79/8007  7:61 AM 1.90* 0.50 - 1.10 mg/dL Final  86/02/6349  8:29 AM 1.69* 0.50 - 1.10 mg/dL Final  16/02/1414  6:40 AM 1.78* 0.50 - 1.10 mg/dL Final  81/06/8543  6:20 AM 1.62* 0.50 - 1.10 mg/dL Final  49/04/3775  5:48 PM 1.58* 0.50 - 1.10 mg/dL Final  06/02/6669  4:42 AM 1.27* 0.50 - 1.10 mg/dL Final  48/02/1265  8:58 PM 1.21* 0.50 - 1.10 mg/dL Final  2/68/5887  9:99 AM 1.43* 0.50 - 1.10 mg/dL Final    Results for orders placed during the hospital encounter of 09/09/14 (from the past 48 hour(s))  GLUCOSE, CAPILLARY     Status: Abnormal   Collection Time    09/25/14 11:16 AM      Result Value Ref Range   Glucose-Capillary 186 (*) 70 - 99 mg/dL   Comment 1 Notify RN    GLUCOSE, CAPILLARY     Status:  Abnormal   Collection Time    09/25/14  4:26 PM      Result Value Ref Range   Glucose-Capillary 193 (*) 70 - 99 mg/dL   Comment 1 Notify RN    GLUCOSE, CAPILLARY     Status: Abnormal   Collection Time    09/25/14  9:00 PM      Result Value Ref Range   Glucose-Capillary 208 (*) 70 - 99 mg/dL  PROTIME-INR     Status: Abnormal    Collection Time    09/26/14  5:23 AM      Result Value Ref Range   Prothrombin Time 19.7 (*) 11.6 - 15.2 seconds   INR 1.67 (*) 0.00 - 8.65  BASIC METABOLIC PANEL     Status: Abnormal   Collection Time    09/26/14  5:23 AM      Result Value Ref Range   Sodium 132 (*) 137 - 147 mEq/L   Potassium 4.3  3.7 - 5.3 mEq/L   Chloride 94 (*) 96 - 112 mEq/L   CO2 25  19 - 32 mEq/L   Glucose, Bld 154 (*) 70 - 99 mg/dL   BUN 76 (*) 6 - 23 mg/dL   Creatinine, Ser 2.63 (*) 0.50 - 1.10 mg/dL   Calcium 7.9 (*) 8.4 - 10.5 mg/dL   GFR calc non Af Amer 18 (*) >90 mL/min   GFR calc Af Amer 21 (*) >90 mL/min   Comment: (NOTE)     The eGFR has been calculated using the CKD EPI equation.     This calculation has not been validated in all clinical situations.     eGFR's persistently <90 mL/min signify possible Chronic Kidney     Disease.   Anion gap 13  5 - 15  GLUCOSE, CAPILLARY     Status: Abnormal   Collection Time    09/26/14  6:09 AM      Result Value Ref Range   Glucose-Capillary 160 (*) 70 - 99 mg/dL  GLUCOSE, CAPILLARY     Status: Abnormal   Collection Time    09/26/14 11:17 AM      Result Value Ref Range   Glucose-Capillary 212 (*) 70 - 99 mg/dL   Comment 1 Notify RN    GLUCOSE, CAPILLARY     Status: Abnormal   Collection Time    09/26/14  4:30 PM      Result Value Ref Range   Glucose-Capillary 181 (*) 70 - 99 mg/dL   Comment 1 Notify RN    GLUCOSE, CAPILLARY     Status: Abnormal   Collection Time    09/26/14  9:32 PM      Result Value Ref Range   Glucose-Capillary 180 (*) 70 - 99 mg/dL   Comment 1 Notify RN    PROTIME-INR     Status: Abnormal   Collection Time    09/27/14  5:29 AM      Result Value Ref Range   Prothrombin Time 19.0 (*) 11.6 - 15.2 seconds   INR 1.59 (*) 0.00 - 7.84  BASIC METABOLIC PANEL     Status: Abnormal   Collection Time    09/27/14  5:29 AM      Result Value Ref Range   Sodium 130 (*) 137 - 147 mEq/L   Potassium 4.2  3.7 - 5.3 mEq/L   Chloride  93 (*) 96 - 112 mEq/L   CO2 25  19 - 32 mEq/L   Glucose, Bld 170 (*)  70 - 99 mg/dL   BUN 86 (*) 6 - 23 mg/dL   Creatinine, Ser 2.80 (*) 0.50 - 1.10 mg/dL   Calcium 7.6 (*) 8.4 - 10.5 mg/dL   GFR calc non Af Amer 17 (*) >90 mL/min   GFR calc Af Amer 19 (*) >90 mL/min   Comment: (NOTE)     The eGFR has been calculated using the CKD EPI equation.     This calculation has not been validated in all clinical situations.     eGFR's persistently <90 mL/min signify possible Chronic Kidney     Disease.   Anion gap 12  5 - 15  CBC     Status: Abnormal   Collection Time    09/27/14  5:29 AM      Result Value Ref Range   WBC 6.8  4.0 - 10.5 K/uL   RBC 2.28 (*) 3.87 - 5.11 MIL/uL   Hemoglobin 7.3 (*) 12.0 - 15.0 g/dL   HCT 22.1 (*) 36.0 - 46.0 %   MCV 96.9  78.0 - 100.0 fL   MCH 32.0  26.0 - 34.0 pg   MCHC 33.0  30.0 - 36.0 g/dL   RDW 17.8 (*) 11.5 - 15.5 %   Platelets 199  150 - 400 K/uL  GLUCOSE, CAPILLARY     Status: Abnormal   Collection Time    09/27/14  6:45 AM      Result Value Ref Range   Glucose-Capillary 180 (*) 70 - 99 mg/dL   Comment 1 Notify RN    RETICULOCYTES     Status: Abnormal   Collection Time    09/27/14  9:25 AM      Result Value Ref Range   Retic Ct Pct 1.9  0.4 - 3.1 %   RBC. 2.34 (*) 3.87 - 5.11 MIL/uL   Retic Count, Manual 44.5  19.0 - 186.0 K/uL    No results found.  [name] is an 64 y.o. 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    Recommendations: Anasarca in setting of Cardiorenal Syndrome & hypoalbuminemia  - Pt with chronic CHF with recent EF 35-40% due to  NICM. She continues to have significant volume overload with weight 23 lb above baseline (330 lb)  She had 35 lb weight loss and improved renal function 3.53 to 2.2 on IV lasix 80 mg BID from 9/14 to 9/25. After transition to PO metolazone (2.5 mg) and torsemide (100 mg BID) on 9/25- 26 both her volume status (7 lb wt increase) and renal function (Cr 2.2 to 2.8) have declined. She needs aggressive IV diuresis. Would recommend restarting IV lasix at 160 mg BID and continuing metolazone 2.5 mg QOD with close monitoring of volume status and renal function as she continues to be significantly volume overloaded.   CKD Stage IV in setting of right solitary kidney - Cr baseline 2.5-3.0 with Cr on admission of 3.38, improved to 1.90 after IV lasix now up to 2.80, increased from 2.2 after PO diuretics were initiated. Pt with adequate urine output with last UA with no proteinuria or hamaturia. Continue renal function monitoring, daily weights, strict I/O's,  renal/fluid restricted diet, and avoidance of nephrotoxins. No indication for HD at this time.  Acute Exacerbation of Chronic Systolic CHF in setting of NICM  - Last 2D-echo on 9/13 with EF 35-40% and diffuse hypokinesis, severe tricuspid regurgitation, and pulmonary hypertension. See plan for above.  Chronic Normocytic Anemia - Hg 7.3 today, below baseline 9. She is s/p 1pRBC on 9/22. Follow-up anemia panel. Caution with transfusion in setting of volume overload. Consider epo or aranesp as outpatient if iron sat <20%. Avoid IV iron in setting of recent infection.   Left LE Cellulitis with Group B Strep Bacteremia - Pt has completed antibiotic course (cefazolin, vanc, cipro, flagy, and ancef) during current hospitalization. Negative repeat blood cultures to date. Atrial Fibrillation - Pt on amiodarone and coumadin per pharmacy. INR (1.59) subtherapeutic today.  Insulin Dependent Type II DM - Last A1c 7. Management per primary team. 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 Morbid Obesity - Pt with OSA and OHS on CPAP as tolerated at night.    Mckaela Howley PGY-II IMTS 09/27/2014, 11:10 AM  Pager [phone]   Attending note to follow

## 2014-09-27 NOTE — Progress Notes (Addendum)
amio PROGRESS NOTE  [name] FBP:[id number] DOB: [birth year] DOA: 09/09/2014 PCP: Elby Showers, MD  64 y.o. female with history of fibromyalgia, hypothyroidism, solitary kidney, hypothyroidism, diabetes, nonischemic cardiomyopathy with ejection fraction of 35%, atrial fibrillation who was admitted on 9/10 w/ recurrent Lower extremity cellulitis, immediately went into Sepstic shock Was in ICU on pressors until 9/14. Transferred to telemetry on 9/15.  Patient is on cefazolin from 9/14.  Clinically since then volume overloaded with anasarca and bring diuresed per Cards  Assessment/Plan: Septic shock due to Left lower leg cellulitis:  - Initially on Vanc/CIpro/FLagyl from 9/11 - changed to IV cefazolin from 9/14 - blood culture  1/2 group B strep,  completed 14 days of antibiotics, between Vanc and ancef  - Ancef stopped 9/24 - Wound care   .Acute on  Combined systolic and diastolic congestive heart failure- EF 30-35% June 2014  -remains volume overload, anasarca, third spacing due to severe hypoalbuminemia and CKD3-4 -was being diuresed with IV lasix, changed to Po demadex and metolazone per Cards over weekend -creatinine trending up, baseline 2, negative 18.3L, but weight still trending up despite being net negative everyday -no ACE due to AKI -baseline dry weight unclear around 330lbs, still has long way to go, unable to get a accurate weight everyday, despite multiple attempts per staff  RUE swelling -improved -dopplers 9/22 negative, due to third spacing  . Type 2 diabetes mellitus:  - A1c 7.0  - lantus increased to 22 units.  - SSI while inpatient   . PAF (paroxysmal atrial fibrillation)  - Currently with a paced rhythm - Coumadin dosed by pharmacy, continue amiodarone   . Hypothyroidism, acquired, autoimmune: Recent TSH was 2.12 on 05/28/14.  - Continue levothyroxine     Gout: stable  -Continue Uloric  . AKI on CKD (chronic kidney disease) stage 4, GFR 15-29 ml/min:  ccl slightly higher from baseline 2.5-3.00 to 3.38 on admission  - improved to 2 range,  now starting to trend up some, monitor with diuresis  -Followed by Dr.Dunham, will request Renal input given overloaded state with CKD, CHF and Hypoalbuminemia  . PACEMAKER, PERMANENT   . Obesity hypoventilation syndrome  - CPAP QHS  . OBESITY, MORBID    Tremors/shakes -gabapentin stopped, monitor    Anemia: -due to CKD and acute illness and iron defi -baseline around 9, transfused 1unit PRBC 9/22 -FU anemia panel, Hb down again, transfuse if Hb trends down further  Code Status: full Family Communication: patient and sister at bedsid Disposition Plan: SNF when stable, LTAC would be ideal option  Consultants:  PCCM  cards  Procedures:  none   HPI/Subjective: Feels so-so, uncomfortable, breathing stable Doesn't feel she loosing much fluid on demadex  Objective: Filed Vitals:   09/27/14 0419  BP: 92/45  Pulse: 98  Temp: 98.9 F (37.2 C)  Resp: 18    Intake/Output Summary (Last 24 hours) at 09/27/14 1001 Last data filed at 09/27/14 0423  Gross per 24 hour  Intake    480 ml  Output   1200 ml  Net   -720 ml   Filed Weights   09/25/14 0700 09/26/14 0610 09/27/14 0419  Weight: 158.26 kg (348 lb 14.4 oz) 158.895 kg (350 lb 4.8 oz) 160.12 kg (353 lb)    Exam:   General:  Pleasant/A+Ox3, morbidly obese  Cardiovascular: irregular, s1s2/RRR  Respiratory: Good air entry bilateral except diminished at bases  Abdomen: obese, soft non tender non distended bowel sounds heard.   Musculoskeletal: 2-3+Edema, less  redness on left leg, small blister with macerated skin and dressing   Skin: stage 3 sacral decubitus, .multiple ulcers over the lower extremities.   Data Reviewed: Basic Metabolic Panel:  Recent Labs Lab 09/23/14 0535 09/24/14 0645 09/25/14 0539 09/26/14 0523 09/27/14 0529  NA 132* 133* 131* 132* 130*  K 4.5 4.4 4.6 4.3 4.2  CL 94* 96 93* 94* 93*  CO2 26  27 26 25 25   GLUCOSE 114* 131* 186* 154* 170*  BUN 64* 66* 71* 76* 86*  CREATININE 2.09* 2.20* 2.44* 2.63* 2.80*  CALCIUM 8.1* 8.2* 8.0* 7.9* 7.6*   Liver Function Tests:  Recent Labs Lab 09/22/14 0500  AST 22  ALT <5  ALKPHOS 194*  BILITOT 0.7  PROT 6.4  ALBUMIN 1.6*   No results found for this basename: LIPASE, AMYLASE,  in the last 168 hours No results found for this basename: AMMONIA,  in the last 168 hours CBC:  Recent Labs Lab 09/21/14 0525 09/21/14 1330 09/22/14 0500 09/24/14 0645 09/27/14 0529  WBC 8.0 10.0 7.6 5.6 6.8  HGB 7.3* 7.9* 8.5* 8.5* 7.3*  HCT 22.5* 24.8* 26.5* 26.4* 22.1*  MCV 97.8 100.4* 99.6 97.4 96.9  PLT 255 308 259 266 199   Cardiac Enzymes: No results found for this basename: CKTOTAL, CKMB, CKMBINDEX, TROPONINI,  in the last 168 hours BNP (last 3 results) No results found for this basename: PROBNP,  in the last 8760 hours CBG:  Recent Labs Lab 09/26/14 0609 09/26/14 1117 09/26/14 1630 09/26/14 2132 09/27/14 0645  GLUCAP 160* 212* 181* 180* 180*    Recent Results (from the past 240 hour(s))  CLOSTRIDIUM DIFFICILE BY PCR     Status: None   Collection Time    09/20/14  9:04 AM      Result Value Ref Range Status   C difficile by pcr NEGATIVE  NEGATIVE Final     Studies: No results found.  Scheduled Meds: . sodium chloride   Intravenous Once  . amiodarone  400 mg Oral Daily  . calcitRIOL  0.25 mcg Oral BID  . calcium-vitamin D  1 tablet Oral BID  . collagenase   Topical Daily  . febuxostat  80 mg Oral Daily  . insulin aspart  0-15 Units Subcutaneous TID WC  . insulin aspart  0-5 Units Subcutaneous QHS  . insulin aspart  2 Units Subcutaneous TID WC  . insulin glargine  22 Units Subcutaneous QHS  . iron polysaccharides  150 mg Oral BID  . levothyroxine  125 mcg Oral QAC breakfast  . linagliptin  5 mg Oral Daily  . metolazone  2.5 mg Oral QODAY  . multivitamin with minerals  1 tablet Oral Daily  . mupirocin cream   Topical  Daily  . potassium chloride  20 mEq Oral Daily  . saccharomyces boulardii  250 mg Oral BID  . sodium chloride  3 mL Intravenous Q12H  . torsemide  100 mg Oral BID  . 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   Obesity hypoventilation syndrome-    Cardiomyopathy, nonischemic- EF 30-35% June 2014   Type 2 diabetes mellitus   Chronic combined systolic and diastolic CHF   Long term (current) use  of anticoagulants   CKD (chronic kidney disease) stage 4, GFR 15-29 ml/min   PAF- recurrent despite Amiodarone and multiple cardioversions   Mitral insufficiency, moderate to severe   Chronic massive bilat LE lymphadema   Fall at home- 3 falls this summer, sound orthostatic   Hypotension   Acute on chronic renal failure   Coarse tremors    Time spent: 25 min    [facility] Pager [phone] If 7PM-7AM, please contact night-coverage at [email or web address], password [facility] 09/27/2014, 10:01 AM  LOS: 18 days

## 2014-09-27 NOTE — Consult Note (Signed)
I have seen and examined this patient and agree with the plan of care [facility] W 09/27/2014, 8:31 PM

## 2014-09-27 NOTE — Progress Notes (Signed)
ANTICOAGULATION CONSULT NOTE - Follow Up Consult  Pharmacy Consult:  Coumadin Indication: atrial fibrillation  Allergies  Allergen Reactions  . Ivp Dye [Iodinated Diagnostic Agents] Shortness Of Breath    Turn red, can't breathe  . Betadine [Povidone Iodine]   . Diphenhydramine Hcl Swelling    In hands and eyes  . Fish Allergy Nausea And Vomiting  . Iohexol      Desc: PT TURNS RED AND WHEEZING   . Penicillins Other (See Comments)    Resp arrest as child. Tolerates cephalosporins.  . Povidone-Iodine Other (See Comments)    Wheezing, turn red, can't breath, and blisters  . Promethazine Hcl Other (See Comments)    Low Blood Pressure  . Tape     Blisters, can use paper tape for short periods  . Clindamycin Hives and Rash    wheezing  . Iodine Rash  . Morphine Sulfate Nausea And Vomiting and Rash  . Primaxin [Imipenem] Rash    Patient Measurements: Height: 5' 6.93" (170 cm) Weight: 353 lb (160.12 kg) IBW/kg (Calculated) : 61.44  Vital Signs: Temp: 98.9 F (37.2 C) (09/28 0419) Temp src: Oral (09/28 0419) BP: 92/45 mmHg (09/28 0419) Pulse Rate: 98 (09/28 0419)  Labs:  Recent Labs  09/25/14 0539 09/26/14 0523 09/27/14 0529  HGB  --   --  7.3*  HCT  --   --  22.1*  PLT  --   --  199  LABPROT 20.7* 19.7* 19.0*  INR 1.78* 1.67* 1.59*  CREATININE 2.44* 2.63* 2.80*    Estimated Creatinine Clearance: 31.5 ml/min (by C-G formula based on Cr of 2.8).  Assessment: 26 YOF continues on Coumadin from PTA for history of Afib. Home regimen: 5 mg TTSS, 2.5 mg MWF.  INR supra-therapeutic (3.4) on admit, then peaked at 5 likely due to drug-drug interactions with Cipro and Flagyl.  She then received Vitamin K 1mg  IV x 1 on 09/12/14.   She has required lower doses than her home regimen during this admission d/t high INRs. As a result of this, her INR dropped to below goal, and is trending down. Coumadin dose increased to 5 mg on 9/27.  Hgb down to 7.3.  Has active order for Niferex  BID, but has often been refusing doses; took a dose this am. Also refusing Oscal D and MVI.  Goal of Therapy:  INR 2-3 Monitor platelets by anticoagulation protocol: Yes  Plan:   Coumadin 5 mg again today.  Continue daily PT/INR.  Will consider increasing Coumadin dose on 9/29 if INR trends down again.  Arty Baumgartner, [place] Pager: [phone] 09/27/2014, 3:30 PM

## 2014-09-28 LAB — PROTIME-INR
INR: 1.56 — AB (ref 0.00–1.49)
PROTHROMBIN TIME: 18.7 s — AB (ref 11.6–15.2)

## 2014-09-28 LAB — CBC
HCT: 22.4 % — ABNORMAL LOW (ref 36.0–46.0)
Hemoglobin: 7.5 g/dL — ABNORMAL LOW (ref 12.0–15.0)
MCH: 32.1 pg (ref 26.0–34.0)
MCHC: 33.5 g/dL (ref 30.0–36.0)
MCV: 95.7 fL (ref 78.0–100.0)
PLATELETS: 192 10*3/uL (ref 150–400)
RBC: 2.34 MIL/uL — ABNORMAL LOW (ref 3.87–5.11)
RDW: 18 % — AB (ref 11.5–15.5)
WBC: 7.2 10*3/uL (ref 4.0–10.5)

## 2014-09-28 LAB — RENAL FUNCTION PANEL
ALBUMIN: 1.4 g/dL — AB (ref 3.5–5.2)
ANION GAP: 14 (ref 5–15)
BUN: 95 mg/dL — ABNORMAL HIGH (ref 6–23)
CHLORIDE: 92 meq/L — AB (ref 96–112)
CO2: 24 mEq/L (ref 19–32)
Calcium: 7.5 mg/dL — ABNORMAL LOW (ref 8.4–10.5)
Creatinine, Ser: 2.95 mg/dL — ABNORMAL HIGH (ref 0.50–1.10)
GFR calc non Af Amer: 16 mL/min — ABNORMAL LOW (ref >90)
GFR, EST AFRICAN AMERICAN: 18 mL/min — AB (ref >90)
Glucose, Bld: 200 mg/dL — ABNORMAL HIGH (ref 70–99)
PHOSPHORUS: 5.6 mg/dL — AB (ref 2.3–4.6)
POTASSIUM: 4 meq/L (ref 3.7–5.3)
SODIUM: 130 meq/L — AB (ref 137–147)

## 2014-09-28 LAB — GLUCOSE, CAPILLARY
Glucose-Capillary: 222 mg/dL — ABNORMAL HIGH (ref 70–99)
Glucose-Capillary: 186 mg/dL — ABNORMAL HIGH (ref 70–99)
Glucose-Capillary: 196 mg/dL — ABNORMAL HIGH (ref 70–99)
Glucose-Capillary: 213 mg/dL — ABNORMAL HIGH (ref 70–99)

## 2014-09-28 LAB — MAGNESIUM: Magnesium: 1.9 mg/dL (ref 1.5–2.5)

## 2014-09-28 MED ORDER — WARFARIN SODIUM 7.5 MG PO TABS
7.5000 mg | ORAL_TABLET | Freq: Once | ORAL | Status: AC
  Administered 2014-09-28: 7.5 mg via ORAL
  Filled 2014-09-28: qty 1

## 2014-09-28 NOTE — Progress Notes (Addendum)
amio PROGRESS NOTE  [name] TDV:[id number] DOB: 1950/07/22 DOA: 09/09/2014 PCP: Elby Showers, MD  PCCM transfer  64 y.o. female with history of Morbid obesity, CKD 4, fibromyalgia, hypothyroidism, solitary kidney, hypothyroidism, diabetes, nonischemic cardiomyopathy with ejection fraction of 35%, atrial fibrillation who was admitted on 9/10 w/ recurrent Lower extremity cellulitis, immediately went into Septic shock Was in ICU on pressors until 9/14.  Subsequently transferred to the Floor, Patient was on cefazolin from 9/14.  Clinically volume overloaded with anasarca and bring diuresed per Cards. Since then creatinine trending up too, Renal following now and on high dose IV lasix  Assessment/Plan: Septic shock due to Left lower leg cellulitis:  - Initially on Vanc/CIpro/FLagyl from 9/11 - changed to IV cefazolin from 9/14 - blood culture  1/2 group B strep,  completed 14 days of antibiotics, between Vanc and ancef  - Ancef stopped 9/24 - Wound care   .Acute on  Combined systolic and diastolic congestive heart failure- EF 30-35% June 2014  -remains volume overload, anasarca, third spacing due to severe hypoalbuminemia and CKD3-4 -was being diuresed with IV lasix, changed to Po demadex and metolazone per Cards over weekend -creatinine trending up, baseline 2, negative 18.3L, but weight still trending up despite being net negative everyday -no ACE due to AKI -baseline dry weight unclear around 330lbs, still has long way to go, unable to get a accurate weight everyday, despite multiple attempts per staff -Renal consulted now on High dose IV lasix per dr.Webb  RUE swelling -improved -dopplers 9/22 negative, due to third spacing  Type 2 diabetes mellitus:  - A1c 7.0  - lantus increased to 22 units.  - SSI while inpatient   . PAF (paroxysmal atrial fibrillation)  - Currently with a paced rhythm - Coumadin dosed by pharmacy, continue amiodarone   . Hypothyroidism, acquired,  autoimmune: Recent TSH was 2.12 on 05/28/14.  - Continue levothyroxine     Gout: stable  -Continue Uloric  . AKI on CKD (chronic kidney disease) stage 4, GFR 15-29 ml/min: ccl slightly higher from baseline 2.5-3.00 to 3.38 on admission  - improved to 2 range,  now starting to trend up some, monitor with diuresis  -Followed by Dr.Dunham, Renal consulted given overloaded state with CKD, CHF and Hypoalbuminemia -now on high dose of IV lasix and metolazone  . PACEMAKER, PERMANENT   . Obesity hypoventilation syndrome  - CPAP QHS  . OBESITY, MORBID    Tremors/shakes -gabapentin stopped, monitor -improving    Anemia: -due to CKD and acute illness and iron defi -baseline around 9, transfused 2units PRBC 9/22 -Anemia panel c/w Chronic disease, transfuse if Hb <7  Code Status: full Family Communication: patient and sister at bedside Disposition Plan: SNF when stable, LTAC would be ideal option  Consultants:  PCCM  cards  Procedures:  none   HPI/Subjective: Feels so-so, uncomfortable, breathing stable Encouraged to be back on IV lasix, feels she needs another transfusion  Objective: Filed Vitals:   09/28/14 0434  BP: 80/42  Pulse: 85  Temp: 99.1 F (37.3 C)  Resp: 20    Intake/Output Summary (Last 24 hours) at 09/28/14 1032 Last data filed at 09/28/14 0557  Gross per 24 hour  Intake      0 ml  Output    350 ml  Net   -350 ml   Filed Weights   09/26/14 0610 09/27/14 0419 09/28/14 0434  Weight: 158.895 kg (350 lb 4.8 oz) 160.12 kg (353 lb) 160.483 kg (353 lb 12.8 oz)  Exam:   General:  Pleasant/A+Ox3, morbidly obese  Cardiovascular: irregular, s1s2/RRR  Respiratory: Good air entry bilateral except diminished at bases  Abdomen: obese, soft non tender non distended bowel sounds heard.   Musculoskeletal: 2-3+Edema, less redness on left leg, small blister with macerated skin and dressing   Skin: stage 3 sacral decubitus, .multiple ulcers over the lower  extremities.   Data Reviewed: Basic Metabolic Panel:  Recent Labs Lab 09/24/14 0645 09/25/14 0539 09/26/14 0523 09/27/14 0529 09/28/14 0533  NA 133* 131* 132* 130* 130*  K 4.4 4.6 4.3 4.2 4.0  CL 96 93* 94* 93* 92*  CO2 27 26 25 25 24   GLUCOSE 131* 186* 154* 170* 200*  BUN 66* 71* 76* 86* 95*  CREATININE 2.20* 2.44* 2.63* 2.80* 2.95*  CALCIUM 8.2* 8.0* 7.9* 7.6* 7.5*  MG  --   --   --   --  1.9  PHOS  --   --   --   --  5.6*   Liver Function Tests:  Recent Labs Lab 09/22/14 0500 09/28/14 0533  AST 22  --   ALT <5  --   ALKPHOS 194*  --   BILITOT 0.7  --   PROT 6.4  --   ALBUMIN 1.6* 1.4*   No results found for this basename: LIPASE, AMYLASE,  in the last 168 hours No results found for this basename: AMMONIA,  in the last 168 hours CBC:  Recent Labs Lab 09/21/14 1330 09/22/14 0500 09/24/14 0645 09/27/14 0529 09/28/14 0533  WBC 10.0 7.6 5.6 6.8 7.2  HGB 7.9* 8.5* 8.5* 7.3* 7.5*  HCT 24.8* 26.5* 26.4* 22.1* 22.4*  MCV 100.4* 99.6 97.4 96.9 95.7  PLT 308 259 266 199 192   Cardiac Enzymes: No results found for this basename: CKTOTAL, CKMB, CKMBINDEX, TROPONINI,  in the last 168 hours BNP (last 3 results) No results found for this basename: PROBNP,  in the last 8760 hours CBG:  Recent Labs Lab 09/27/14 0645 09/27/14 1119 09/27/14 1624 09/27/14 2121 09/28/14 0554  GLUCAP 180* 215* 203* 243* 222*    Recent Results (from the past 240 hour(s))  CLOSTRIDIUM DIFFICILE BY PCR     Status: None   Collection Time    09/20/14  9:04 AM      Result Value Ref Range Status   C difficile by pcr NEGATIVE  NEGATIVE Final     Studies: No results found.  Scheduled Meds: . amiodarone  400 mg Oral Daily  . calcitRIOL  0.25 mcg Oral BID  . calcium-vitamin D  1 tablet Oral BID  . collagenase   Topical Daily  . febuxostat  80 mg Oral Daily  . furosemide  160 mg Intravenous BID  . insulin aspart  0-15 Units Subcutaneous TID WC  . insulin aspart  0-5 Units  Subcutaneous QHS  . insulin aspart  2 Units Subcutaneous TID WC  . insulin glargine  22 Units Subcutaneous QHS  . iron polysaccharides  150 mg Oral BID  . levothyroxine  125 mcg Oral QAC breakfast  . linagliptin  5 mg Oral Daily  . metolazone  2.5 mg Oral QODAY  . multivitamin with minerals  1 tablet Oral Daily  . mupirocin cream   Topical Daily  . potassium chloride  20 mEq Oral Daily  . saccharomyces boulardii  250 mg Oral BID  . sodium chloride  3 mL Intravenous Q12H  . 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   Obesity hypoventilation syndrome-    Cardiomyopathy, nonischemic- EF 30-35% June 2014   Type 2 diabetes mellitus   Chronic combined systolic and diastolic CHF   Long term (current) use of anticoagulants   CKD (chronic kidney disease) stage 4, GFR 15-29 ml/min   PAF- recurrent despite Amiodarone and multiple cardioversions   Mitral insufficiency, moderate to severe   Chronic massive bilat LE lymphadema   Fall at home- 3 falls this summer, sound orthostatic   Hypotension   Acute on chronic renal failure   Coarse tremors    Time spent: 25 min    [facility] Pager [phone] If 7PM-7AM, please contact night-coverage at [email or web address], password [facility] 09/28/2014, 10:32 AM  LOS: 19 days

## 2014-09-28 NOTE — Progress Notes (Signed)
[facility] Rounding Note   Subjective:  Pt seen and examined in AM. No acute events overnight. She received 1 dose of 160 mg of IV lasix yesterday. She reports feeling better today with less tremors. She continues to have upper and lower extremity swelling. She denies dyspnea, nausea, vomiting, abdominal pain, or change in BM.    Objective:    General: Resting comfortably in bed Cardiac: Irregularly irregular rhythm with normal rate. Murmur present. Pulmonary: Clear to auscultation bilaterally with no rales, ronchi, or wheezing GI: Soft, non-tender, non-distended with normal BS Extremities: +3/4 pitting b/l LE edema and b/l UE edema. Erythema and tenderness to touch of LLE  Vital signs in last 24 hours: Filed Vitals:   09/26/14 2054 09/27/14 0419 09/27/14 1950 09/28/14 0434  BP: 85/47 92/45 90/40  80/42  Pulse: 88 98 88 85  Temp: 99.4 F (37.4 C) 98.9 F (37.2 C) 100 F (37.8 C) 99.1 F (37.3 C)  TempSrc: Oral Oral Oral Oral  Resp: 18 18 17 20   Height:      Weight:  353 lb (160.12 kg)  353 lb 12.8 oz (160.483 kg)  SpO2: 92% 91% 92% 93%   Weight change: 12.8 oz (0.363 kg)  Intake/Output Summary (Last 24 hours) at 09/28/14 0734 Last data filed at 09/28/14 0557  Gross per 24 hour  Intake    240 ml  Output    350 ml  Net   -110 ml    Physical Exam:  Blood pressure 80/42, pulse 85, temperature 99.1 F (37.3 C), temperature source Oral, resp. rate 20, height 5' 6.93" (1.7 m), weight 353 lb 12.8 oz (160.483 kg), SpO2 93.00%.  Labs:   Recent Labs Lab 09/21/14 1730 09/22/14 0500 09/23/14 0535 09/24/14 0645 09/25/14 0539 09/26/14 0523 09/27/14 0529  NA 131* 134* 132* 133* 131* 132* 130*  K 4.6 4.9 4.5 4.4 4.6 4.3 4.2  CL 94* 96 94* 96 93* 94* 93*  CO2 27 28 26 27 26 25 25   GLUCOSE 179* 139* 114* 131* 186* 154* 170*  BUN 64* 64* 64* 66* 71* 76* 86*  CREATININE 2.06* 2.10* 2.09* 2.20* 2.44* 2.63* 2.80*  CALCIUM 8.0* 7.9* 8.1* 8.2* 8.0* 7.9* 7.6*      Recent Labs Lab 09/22/14 0500 09/28/14 0533  AST 22  --   ALT <5  --   ALKPHOS 194*  --   BILITOT 0.7  --   PROT 6.4  --   ALBUMIN 1.6* 1.4*   No results found for this basename: LIPASE, AMYLASE,  in the last 168 hours No results found for this basename: AMMONIA,  in the last 168 hours   Recent Labs Lab 09/22/14 0500 09/24/14 0645 09/27/14 0529 09/28/14 0533  WBC 7.6 5.6 6.8 7.2  HGB 8.5* 8.5* 7.3* 7.5*  HCT 26.5* 26.4* 22.1* 22.4*  MCV 99.6 97.4 96.9 95.7  PLT 259 266 199 192    @LABRCNTIP (inr:5)  )No results found for this basename: CKTOTAL, CKMB, CKMBINDEX, TROPONINI,  in the last 168 hours   Recent Labs Lab 09/27/14 0645 09/27/14 1119 09/27/14 1624 09/27/14 2121 09/28/14 0554  GLUCAP 180* 215* 203* 243* 222*      Recent Labs Lab 09/27/14 0925  IRON 48  TIBC 162*  FERRITIN 632*    Studies/Results: No results found.    Marland Kitchen amiodarone  400 mg Oral Daily  . calcitRIOL  0.25 mcg Oral BID  . calcium-vitamin D  1 tablet Oral BID  . collagenase   Topical Daily  .  febuxostat  80 mg Oral Daily  . furosemide  160 mg Intravenous BID  . insulin aspart  0-15 Units Subcutaneous TID WC  . insulin aspart  0-5 Units Subcutaneous QHS  . insulin aspart  2 Units Subcutaneous TID WC  . insulin glargine  22 Units Subcutaneous QHS  . iron polysaccharides  150 mg Oral BID  . levothyroxine  125 mcg Oral QAC breakfast  . linagliptin  5 mg Oral Daily  . metolazone  2.5 mg Oral QODAY  . multivitamin with minerals  1 tablet Oral Daily  . mupirocin cream   Topical Daily  . potassium chloride  20 mEq Oral Daily  . saccharomyces boulardii  250 mg Oral BID  . sodium chloride  3 mL Intravenous Q12H  . Warfarin - Pharmacist Dosing Inpatient   Does not apply q1800     I  have reviewed scheduled and prn medications.  Background: [name] is an 64 y.o. female with past medical history of NICM with ICD, chronic systolic CHF (EF 09-73% on 09/12/14), solitary  right kidney with CKD Stage IV, hypothyroidism on replacement, pulmonary hypertension, atrial PAF on coumadin, morbid obesity, fibromyalgia, insulin-dependent Type II DM, and OSA who we are consulted for in regards to management of volume overload in setting of chronic CHF and CKD Stage IV with hypoalbuminemia .   Recommendations:   Anasarca in setting of Cardiorenal Syndrome & Hypoalbuminemia - Pt with chronic CHF with recent EF 35-40% due to NICM. She continues to have significant volume overload with weight 23 lb above baseline (330 lb) She had 35 lb weight loss and improved renal function 3.53 to 2.2 on IV lasix 80 mg BID from 9/14 to 9/25. After transition to PO metolazone (2.5 mg) and torsemide (100 mg BID) on 9/25- 26 both her volume status (7 lb wt increase) and renal function (Cr 2.2 to 2.8) declined. Currently on IV lasix at 160 mg BID (started on 9/29) and metolazone 2.5 mg QOD. Will closely monitor volume status and renal function on aggressive diuresis and adjust as necessary.  CKD Stage IV in setting of right solitary kidney - Cr 2.95 today at baseline 2.5-3.0 with Cr on admission of 3.38, improved to 1.90 after IV lasix and worsened to 2.80 after PO diuretics were initiated. Pt with adequate urine output with last UA with no proteinuria or hamaturia. Continue renal function monitoring, daily weights, strict I/O's, renal/fluid restricted diet, and avoidance of nephrotoxins. No indication for HD at this time.  Acute Exacerbation of Chronic Systolic CHF in setting of NICM - Last 2D-echo on 9/13 with EF 35-40% and diffuse hypokinesis, severe tricuspid regurgitation, and pulmonary hypertension. See plan for above.  Hyperphosphatemia - Phos 5.6 this AM with corrected Ca 9.6. Consider calcium-based phosphate binder with meals. Obtain PTH level.  Anemia of Chronic Disease - Hg 7.5 today, below baseline 9. She is s/p 1pRBC on 9/22.  Anemia panel with Iron sat 30%.  Caution with transfusion in setting  of volume overload.  Avoid IV iron in setting of recent infection.  Left LE Cellulitis with Group B Strep Bacteremia - Pt has completed antibiotic course (cefazolin, vanc, cipro, flagy, and ancef) during current hospitalization. Negative repeat blood cultures to date.  Atrial Fibrillation - Pt on amiodarone and coumadin per pharmacy. INR (1.56) subtherapeutic today.  Insulin Dependent Type II DM - Last A1c 7. Management per primary team.  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  Morbid  Obesity - Pt with OSA and OHS on CPAP as tolerated at night.

## 2014-09-28 NOTE — Progress Notes (Signed)
I have seen and examined this patient and agree with the plan of care   [facility] W 09/28/2014, 11:45 AM

## 2014-09-28 NOTE — Progress Notes (Signed)
Agree with the note and current management strategy. Prognosis is poor.

## 2014-09-28 NOTE — Progress Notes (Signed)
Subjective: Weak tired, upset, she does not know when she needs to have BM and goes in the bed. Edema is better.    Objective: Vital signs in last 24 hours: Temp:  [99.1 F (37.3 C)-100 F (37.8 C)] 99.1 F (37.3 C) (09/29 0434) Pulse Rate:  [85-88] 85 (09/29 0434) Resp:  [17-20] 20 (09/29 0434) BP: (80-90)/(40-42) 80/42 mmHg (09/29 0434) SpO2:  [92 %-93 %] 93 % (09/29 0434) Weight:  [353 lb 12.8 oz (160.483 kg)] 353 lb 12.8 oz (160.483 kg) (09/29 0434) Weight change: 12.8 oz (0.363 kg) Last BM Date: 09/27/14 Intake/Output from previous day: 09/28 0701 - 09/29 0700 In: 240 [P.O.:240] Out: 350 [Urine:350] Intake/Output this shift:    PE: General:Pleasant affect but depressed, tearful, NAD Skin:Warm and dry, brisk capillary refill HEENT:normocephalic, sclera clear, mucus membranes moist Heart:irreg irreg with 2/6 systolic murmur,no gallup, rub or click Lungs: with fine rales bases ,no rhonchi, or wheezes BBC:WUGQB, soft, non tender, + BS, do not palpate liver spleen or masses Ext:3+ lower ext edema bil. To waist, though has improved from 1 week ago.  2+ radial pulses rt arm swollen compared to left, but this too has improved + erythema of both lower ext. More pronounced than last week Neuro:alert and oriented X 3, MAE, follows commands, + facial symmetry   Lab Results:  Recent Labs  09/27/14 0529 09/28/14 0533  WBC 6.8 7.2  HGB 7.3* 7.5*  HCT 22.1* 22.4*  PLT 199 192   BMET  Recent Labs  09/27/14 0529 09/28/14 0533  NA 130* 130*  K 4.2 4.0  CL 93* 92*  CO2 25 24  GLUCOSE 170* 200*  BUN 86* 95*  CREATININE 2.80* 2.95*  CALCIUM 7.6* 7.5*   No results found for this basename: TROPONINI, CK, MB,  in the last 72 hours  Lab Results  Component Value Date   CHOL 149 11/30/2013   HDL 33* 11/30/2013   LDLCALC 89 11/30/2013   TRIG 137 11/30/2013   CHOLHDL 4.5 11/30/2013   Lab Results  Component Value Date   HGBA1C 7.0* 03/05/2014     Lab Results    Component Value Date   TSH 2.121 05/28/2014    Hepatic Function Panel  Recent Labs  09/28/14 0533  ALBUMIN 1.4*     Studies/Results: No results found.  Medications: I have reviewed the patient's current medications. Scheduled Meds: . amiodarone  400 mg Oral Daily  . calcitRIOL  0.25 mcg Oral BID  . calcium-vitamin D  1 tablet Oral BID  . collagenase   Topical Daily  . febuxostat  80 mg Oral Daily  . furosemide  160 mg Intravenous BID  . insulin aspart  0-15 Units Subcutaneous TID WC  . insulin aspart  0-5 Units Subcutaneous QHS  . insulin aspart  2 Units Subcutaneous TID WC  . insulin glargine  22 Units Subcutaneous QHS  . iron polysaccharides  150 mg Oral BID  . levothyroxine  125 mcg Oral QAC breakfast  . linagliptin  5 mg Oral Daily  . metolazone  2.5 mg Oral QODAY  . multivitamin with minerals  1 tablet Oral Daily  . mupirocin cream   Topical Daily  . potassium chloride  20 mEq Oral Daily  . saccharomyces boulardii  250 mg Oral BID  . sodium chloride  3 mL Intravenous Q12H  . Warfarin - Pharmacist Dosing Inpatient   Does not apply q1800   Continuous Infusions:  PRN Meds:.albuterol, ALPRAZolam,  calcium carbonate, HYDROcodone-acetaminophen, hydrOXYzine, nitroGLYCERIN, sodium chloride, sodium chloride, sodium chloride  Assessment/Plan: KYLIE SIMMONDS is an 64 y.o. 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   Acute on Chronic Systolic HF: recent EF 02-54% due to NICM still significantly volume overloaded with increased RUE edema yesterday. Anasarca  -- Crea continues to increase 2.2 -->  2.4-->2.6--> 2.8 --> 2.95 with torsemide and metolazone now with renal consult on IV lasix 160 mg BID and metolazone. She has single kidney.  -- She refuses HD.  -- The patient is adamant about HD. Diuretics don't seem to work and her Creat is worsening. She has low protein and is third spacing fluid into subcutaneous tissues. On metalazone every other day and torsemide 100mg  BID  -- Net neg 18L. Weights all over the board. But weight increased in past couple days 343--> 353 --> 353.12.  -- IM has consulted nephrology for discussion about further management.  Persistent A-fib: rate controlled. Continue amiodarone, but reduced to once daily dosing yesterday. Recently failed DCCV -- AC with coumadin. INR 1.56 today. Coumadin was previously held due to anemia but then restarted s/p transfusion. Now Hg 7.3 again  -- Dosing per pharmacy  Cellulitis of the LEE: she has completed course of antibiotics. Mild low grade fever.   Anemia: s/p transfusion. Hgb went from 7.3 to 8.5. Now trending downwards to 7.3--> 7.5. Consider another transfusion.  CKD: Scr has increased from 2.2--> 2.8-->2.95. Continue to monitor.  Tremors: patient complaining of significant tremor since being in ICU. Gabapentin was recently started and can cause tremor. Dr Meda Coffee recommended this be discontinued. This was stopped yesterday with no significant improvement in her tremors. But tremors have improved off of torsemide.       LOS: 19 days   Time spent with pt. :45 minutes due to computers down and prolonged charting. [facility]  Nurse Practitioner Certified Pager [phone] or after 5pm and on weekends call [phone] 09/28/2014, 9:42 AM

## 2014-09-28 NOTE — Progress Notes (Signed)
ANTICOAGULATION CONSULT NOTE - Follow Up Consult  Pharmacy Consult:  Coumadin Indication: atrial fibrillation  Allergies  Allergen Reactions  . Ivp Dye [Iodinated Diagnostic Agents] Shortness Of Breath    Turn red, can't breathe  . Betadine [Povidone Iodine]   . Diphenhydramine Hcl Swelling    In hands and eyes  . Fish Allergy Nausea And Vomiting  . Iohexol      Desc: PT TURNS RED AND WHEEZING   . Penicillins Other (See Comments)    Resp arrest as child. Tolerates cephalosporins.  . Povidone-Iodine Other (See Comments)    Wheezing, turn red, can't breath, and blisters  . Promethazine Hcl Other (See Comments)    Low Blood Pressure  . Tape     Blisters, can use paper tape for short periods  . Clindamycin Hives and Rash    wheezing  . Iodine Rash  . Morphine Sulfate Nausea And Vomiting and Rash  . Primaxin [Imipenem] Rash    Patient Measurements: Height: 5' 6.93" (170 cm) Weight: 353 lb 12.8 oz (160.483 kg) IBW/kg (Calculated) : 61.44  Vital Signs: Temp: 98.5 F (36.9 C) (09/29 1300) Temp src: Oral (09/29 1300) BP: 84/42 mmHg (09/29 1300) Pulse Rate: 90 (09/29 1300)  Labs:  Recent Labs  09/26/14 0523 09/27/14 0529 09/28/14 0533  HGB  --  7.3* 7.5*  HCT  --  22.1* 22.4*  PLT  --  199 192  LABPROT 19.7* 19.0* 18.7*  INR 1.67* 1.59* 1.56*  CREATININE 2.63* 2.80* 2.95*    Estimated Creatinine Clearance: 29.9 ml/min (by C-G formula based on Cr of 2.95).  Assessment: [name] continues on Coumadin from PTA for history of Afib. Home regimen: 5 mg TTSS, 2.5 mg MWF.  INR supra-therapeutic (3.4) on admit, then peaked at 5 likely due to drug-drug interactions with Cipro and Flagyl.  She then received Vitamin K 1mg  IV x 1 on 09/12/14.   She required lower doses than her home regimen during this admission d/t high INRs. As a result of this, her INR dropped to below goal, and is now trending down. Has had Coumadin 5 mg daily for the last 2 days. Hgb is low at 7.5.  Has  active order for Niferex BID, but has often been refusing doses, but took am dose the last 2 days. Also refusing Oscal D and MVI.  Goal of Therapy:  INR 2-3 Monitor platelets by anticoagulation protocol: Yes  Plan:   Coumadin 7.5 mg x 1 today.  Continue daily PT/INR.  Arty Baumgartner, [place] Pager: [phone] 09/28/2014, 3:01 PM

## 2014-09-28 NOTE — Progress Notes (Signed)
Physical Therapy Treatment Patient Details Name: [name] MRN: [id number] DOB: [birth year] Today's Date: 09/28/2014    History of Present Illness 64 y.o. female  with history of fibromyalgia, hypothyroidism, solitary kidney, hypothyroidism, diabetes, nonischemic cardiomyopathy with ejection fraction of 35%, atrial fibrillation who presented to the emergency department with left-sided lower leg pain. She was admitted on 9/10 w/ recurrent Lower extremity cellulitis. PCCM asked to see in consult on 9/11 for persistent hypotension and worsening renal failure.  She had a previous admission for LE cellulitis in 07/2014 followed by [facility].  Pt had returned home with HHPT prior to this admission.     PT Comments    Pt admitted with above. Pt currently with functional limitations due to weakness and multiple complications from day to day limiting progression.  Pt will benefit from skilled PT to increase their independence and safety with mobility to allow discharge to the venue listed below.   Follow Up Recommendations  SNF;Supervision/Assistance - 24 hour     Equipment Recommendations  None recommended by PT    Recommendations for Other Services       Precautions / Restrictions Precautions Precautions: Fall Restrictions Weight Bearing Restrictions: No    Mobility  Bed Mobility                  Transfers                    Ambulation/Gait                 Stairs            Wheelchair Mobility    Modified Rankin (Stroke Patients Only)       Balance                                    Cognition Arousal/Alertness: Awake/alert Behavior During Therapy: Anxious Overall Cognitive Status: Within Functional Limits for tasks assessed                      Exercises General Exercises - Upper Extremity Shoulder Flexion: AROM;Strengthening;Both;10 reps;Supine;Bar weights/barbell Bar Weights/Barbell (Shoulder Flexion): 1  lb Shoulder ABduction: AROM;Both;Strengthening;10 reps;Supine;Bar weights/barbell Bar Weights/Barbell (Shoulder Abduction): 1 lb Elbow Flexion: AROM;Both;10 reps;Supine;Bar weights/barbell Bar Weights/Barbell (Elbow Flexion): 1 lb Elbow Extension: AROM;Both;10 reps;Supine;Bar weights/barbell Bar Weights/Barbell (Elbow Extension): 1 lb General Exercises - Lower Extremity Ankle Circles/Pumps: AROM;Strengthening;Both;15 reps;Supine Quad Sets: AROM;Both;10 reps;Supine Gluteal Sets: AROM;Both;10 reps;Supine Heel Slides: Both;10 reps;Supine;AAROM Hip ABduction/ADduction: AAROM;Both;10 reps;Supine Straight Leg Raises: AAROM;Both;5 reps;Supine Other Exercises Other Exercises: Pt unable to perform resisted pushing up with UEs against therapist secondary to right shoulder sore per pt.     General Comments General comments (skin integrity, edema, etc.): Positioned to protect skin and edema.      Pertinent Vitals/Pain Pain Assessment: Faces Pain Score: 6  Faces Pain Scale: Hurts whole lot Pain Location: bil LES Pain Descriptors / Indicators: Aching;Numbness Pain Intervention(s): Limited activity within patient's tolerance;Monitored during session;Premedicated before session;Repositioned VSS    Home Living                      Prior Function            PT Goals (current goals can now be found in the care plan section) Progress towards PT goals: Progressing toward goals    Frequency  Min 2X/week    PT Plan  Current plan remains appropriate    Co-evaluation             End of Session Equipment Utilized During Treatment: Gait belt Activity Tolerance: Patient limited by fatigue;Patient limited by pain Patient left: in bed;with call bell/phone within reach;with family/visitor present     Time: 1005-1038 PT Time Calculation (min): 33 min  Charges:  $Therapeutic Exercise: 23-37 mins                    G Codes:      INGOLD,Gurjot Brisco 10-02-2014, 11:44 AM [facility] [phone] (pager)

## 2014-09-28 NOTE — Progress Notes (Signed)
Refused CPAP. Let her know to have her RN call us if she changes her mind.

## 2014-09-29 ENCOUNTER — Encounter (HOSPITAL_COMMUNITY): Payer: Self-pay | Admitting: General Surgery

## 2014-09-29 LAB — MAGNESIUM: Magnesium: 1.9 mg/dL (ref 1.5–2.5)

## 2014-09-29 LAB — CBC
HCT: 25.3 % — ABNORMAL LOW (ref 36.0–46.0)
Hemoglobin: 8.3 g/dL — ABNORMAL LOW (ref 12.0–15.0)
MCH: 32.7 pg (ref 26.0–34.0)
MCHC: 32.8 g/dL (ref 30.0–36.0)
MCV: 99.6 fL (ref 78.0–100.0)
Platelets: 189 10*3/uL (ref 150–400)
RBC: 2.54 MIL/uL — ABNORMAL LOW (ref 3.87–5.11)
RDW: 18.2 % — AB (ref 11.5–15.5)
WBC: 6.6 10*3/uL (ref 4.0–10.5)

## 2014-09-29 LAB — RENAL FUNCTION PANEL
ANION GAP: 14 (ref 5–15)
Albumin: 1.4 g/dL — ABNORMAL LOW (ref 3.5–5.2)
BUN: 95 mg/dL — ABNORMAL HIGH (ref 6–23)
CO2: 25 meq/L (ref 19–32)
Calcium: 7.4 mg/dL — ABNORMAL LOW (ref 8.4–10.5)
Chloride: 92 mEq/L — ABNORMAL LOW (ref 96–112)
Creatinine, Ser: 2.91 mg/dL — ABNORMAL HIGH (ref 0.50–1.10)
GFR calc non Af Amer: 16 mL/min — ABNORMAL LOW (ref >90)
GFR, EST AFRICAN AMERICAN: 18 mL/min — AB (ref >90)
GLUCOSE: 160 mg/dL — AB (ref 70–99)
Phosphorus: 5.5 mg/dL — ABNORMAL HIGH (ref 2.3–4.6)
Potassium: 3.8 mEq/L (ref 3.7–5.3)
Sodium: 131 mEq/L — ABNORMAL LOW (ref 137–147)

## 2014-09-29 LAB — GLUCOSE, CAPILLARY
Glucose-Capillary: 189 mg/dL — ABNORMAL HIGH (ref 70–99)
Glucose-Capillary: 157 mg/dL — ABNORMAL HIGH (ref 70–99)
Glucose-Capillary: 166 mg/dL — ABNORMAL HIGH (ref 70–99)
Glucose-Capillary: 173 mg/dL — ABNORMAL HIGH (ref 70–99)

## 2014-09-29 LAB — PROTIME-INR
INR: 1.6 — ABNORMAL HIGH (ref 0.00–1.49)
PROTHROMBIN TIME: 19.1 s — AB (ref 11.6–15.2)

## 2014-09-29 MED ORDER — WARFARIN SODIUM 7.5 MG PO TABS
7.5000 mg | ORAL_TABLET | Freq: Once | ORAL | Status: AC
  Administered 2014-09-29: 7.5 mg via ORAL
  Filled 2014-09-29 (×2): qty 1

## 2014-09-29 MED ORDER — SODIUM CHLORIDE 0.9 % IV BOLUS (SEPSIS)
250.0000 mL | Freq: Once | INTRAVENOUS | Status: AC
  Administered 2014-09-29: 250 mL via INTRAVENOUS

## 2014-09-29 NOTE — Progress Notes (Signed)
Pt refuses to wear CPAP. RT will monitor. 

## 2014-09-29 NOTE — Consult Note (Signed)
Seen together. Agree.  Georganna Skeans, MD, MPH, FACS Trauma: [phone] General Surgery: [phone]  09/29/2014 4:48 PM

## 2014-09-29 NOTE — Consult Note (Signed)
LASHALA LASER [birth year]  [id number].   Requesting MD: Dr. Velvet Bathe Chief Complaint/Reason for Consult: lower extremity wounds HPI: This is a 64 yo white female with multiple medical problems who has had several wounds on her legs for the last several months.  She was debrided by Dr. Hassell Done in the office and by [facility] service in the hospital in late July.  She was then transferred to a nursing facility.  Her original wounds are improving and healing, but she has developed several new wounds as well.  She was admitted several days ago due to hypotension and fevers.  We have been asked to see her today for further evaluation and management of her wounds.  ROS: Please see HPI, otherwise she c/o numbness of her right index finger and pain all over.  Otherwise negative  Family History  Problem Relation Age of Onset  . Cardiomyopathy Brother   . Diabetes Mother   . Cancer Maternal Grandmother   . Heart attack Mother     Past Medical History  Diagnosis Date  . Personal history of other diseases of circulatory system   . 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 Chronic lymphocytic thyroiditis   . Morbid obesity   . Other chronic pulmonary heart diseases   . Obstructive sleep apnea (adult) (pediatric)   . Other and unspecified hyperlipidemia   . Fibromyalgia   . Goiter   . Thyroiditis, autoimmune   . Hypothyroidism, acquired, autoimmune   . Solitary kidney, acquired   . Fatigue   . Hyperparathyroidism, secondary   . Vitamin D deficiency disease   . Hyperparathyroidism   . H/O varicose veins 03/27/12  . Type II or unspecified type diabetes mellitus without mention of complication, not stated as uncontrolled   . Diabetes mellitus type II   . Infections of kidney 03/27/12  . History of measles, mumps, or rubella 03/27/12  . Tubal pregnancy 12/23/76  . Unspecified essential hypertension     20 yrs ago  . Blood transfusion 1977    [facility]  . Complication of anesthesia  2010    hard to wake up after knee surgery  . Anemia   . Cardiomyopathy, nonischemic     EF 45 %  . Endometrial hyperplasia with atypia 05/05/12  . Diastolic HF (heart failure) 11/05/2012  . A-fib   . Echocardiogram abnormal 07/15/12    severe MR,mod to severe TR,mod to severe PA hypertension  . Pulmonary arterial hypertension   . PAF (paroxysmal atrial fibrillation), 1st episode since DCCV in Jan/14 03/19/2013  . Ulcers of both lower extremities, small, now with cellulitis 06/05/2013    Past Surgical History  Procedure Laterality Date  . Cholecystectomy    . Pacemaker placement  02/26/11    Medtronic Adapta  . Knee surgery    . Resection duplicate left kidney    . 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 Hernia repair  1999    ruptured ;  . D& c with hysteroscopy & cervical polypectomy  05/05/12  . Cardioversion N/A 02/06/2013    Successful  . Iud removal  01/30/13  . Ectopic pregnancy surgery  1978  . Cardioversion N/A 06/08/2013    Procedure: CARDIOVERSION;  Surgeon: Sanda Klein, MD;  Location: [place];  Service: Cardiovascular;  Laterality: N/A;  Room [phone]  . Cardioversion N/A 02/01/2014    Procedure: CARDIOVERSION;  Surgeon: Sanda Klein, MD;  Location: [place];  Service: Cardiovascular;  Laterality:  N/A;  . Cardioversion N/A 06/02/2014    Procedure: CARDIOVERSION;  Surgeon: Sanda Klein, MD;  Location: [facility];  Service: Cardiovascular;  Laterality: N/A;    Social History:  reports that she has never smoked. She has never used smokeless tobacco. She reports that she does not drink alcohol or use illicit drugs.  Allergies:  Allergies  Allergen Reactions  . Ivp Dye [Iodinated Diagnostic Agents] Shortness Of Breath    Turn red, can't breathe  . Betadine [Povidone Iodine]   . Diphenhydramine Hcl Swelling    In hands and eyes  . Fish Allergy Nausea And Vomiting  . Iohexol      Desc: PT TURNS RED AND WHEEZING   . Penicillins Other (See Comments)    Resp  arrest as child. Tolerates cephalosporins.  . Povidone-Iodine Other (See Comments)    Wheezing, turn red, can't breath, and blisters  . Promethazine Hcl Other (See Comments)    Low Blood Pressure  . Tape     Blisters, can use paper tape for short periods  . Clindamycin Hives and Rash    wheezing  . Iodine Rash  . Morphine Sulfate Nausea And Vomiting and Rash  . Primaxin [Imipenem] Rash    Medications Prior to Admission  Medication Sig Dispense Refill  . albuterol (PROVENTIL HFA;VENTOLIN HFA) 108 (90 BASE) MCG/ACT inhaler Inhale 2 puffs into the lungs every 6 (six) hours as needed for wheezing or shortness of breath.       Marland Kitchen albuterol (PROVENTIL) (2.5 MG/3ML) 0.083% nebulizer solution Take 2.5 mg by nebulization every 6 (six) hours as needed for wheezing or shortness of breath.       Marland Kitchen amiodarone (PACERONE) 200 MG tablet Take 2 tablets (400 mg total) by mouth 2 (two) times daily.  90 tablet  6  . calcitRIOL (ROCALTROL) 0.25 MCG capsule Take 0.25 mcg by mouth 2 (two) times daily.      . calcium citrate-vitamin D 200-200 MG-UNIT TABS Take 1 tablet by mouth 4 (four) times daily.       . camphor-menthol (SARNA) lotion Apply 1 application topically daily as needed for itching.      . collagenase (SANTYL) ointment Apply 1 application topically daily. Bilateral medial thigh ulcers.      . doxycycline (VIBRAMYCIN) 100 MG capsule Take 1 capsule (100 mg total) by mouth 2 (two) times daily.  30 capsule  0  . glyBURIDE (DIABETA) 5 MG tablet TAKE 1-2 TABLETS TWICE DAILY WITH A MEAL. TAKE 10MG (2 TABS) IN THE A.M. AND 5MG(1 TAB) AT NIGHT.  120 tablet  2  . HYDROcodone-acetaminophen (NORCO) 10-325 MG per tablet Take 1 tablet by mouth every 4 (four) hours as needed for moderate pain.       . hydrOXYzine (ATARAX/VISTARIL) 25 MG tablet Take 1 tablet (25 mg total) by mouth every 4 (four) hours as needed for itching.  30 tablet  0  . insulin glargine (LANTUS) 100 UNIT/ML injection Inject 27 Units into the skin  at bedtime.       . iron polysaccharides (NIFEREX) 150 MG capsule Take 150 mg by mouth 2 (two) times daily.      Marland Kitchen levothyroxine (SYNTHROID, LEVOTHROID) 125 MCG tablet Take 1 tablet (125 mcg total) by mouth daily before breakfast.      . metolazone (ZAROXOLYN) 5 MG tablet Take 2.5 mg by mouth daily as needed (for weight gain). If up 4-5 lbs.      . metoprolol tartrate (LOPRESSOR) 25 MG tablet Take 12.5  mg by mouth 2 (two) times daily.      . Multiple Vitamin (MULTIVITAMIN) tablet Take 1 tablet by mouth daily.       . mupirocin ointment (BACTROBAN) 2 % Apply topically 2 (two) times daily. Apply to lower extremity ulcers not being treated with hydrotherapy  22 g  0  . nitroGLYCERIN (NITROSTAT) 0.4 MG SL tablet Place 0.4 mg under the tongue every 5 (five) minutes as needed. For chest pain      . saccharomyces boulardii (FLORASTOR) 250 MG capsule Take 250 mg by mouth 2 (two) times daily.      . sitaGLIPtin (JANUVIA) 100 MG tablet Take 100 mg by mouth daily.      Marland Kitchen torsemide (DEMADEX) 100 MG tablet Take 50-100 mg by mouth 2 (two) times daily. 100 mg in the am and 50 mg in the pm      . ULORIC 40 MG tablet Take 80 mg by mouth daily. Take two tablet      . Vitamin D, Ergocalciferol, (DRISDOL) 50000 UNITS CAPS capsule Take 50,000 Units by mouth 2 (two) times a week. Takes on Tuesdays and Fridays      . VOLTAREN 1 % GEL Apply 2 g topically 4 (four) times daily as needed (for pain).       Marland Kitchen warfarin (COUMADIN) 5 MG tablet Take 2.5-5 mg by mouth See admin instructions. Takes 5 mg on Sunday, Tuesday, Thursday and Saturday and 2.5 mg all other days      . ALPRAZolam (XANAX) 0.25 MG tablet       . BD PEN NEEDLE NANO U/F 32G X 4 MM MISC USE AS DIRECTED  100 each  6  . glucose blood (ONE TOUCH ULTRA TEST) test strip Test 3 times daily  100 each  12  . Insulin Pen Needle (B-D UF III MINI PEN NEEDLES) 31G X 5 MM MISC Check blood sugar up to 6x daily  200 each  3    Blood pressure 81/44, pulse 79, temperature 97.7  F (36.5 C), temperature source Oral, resp. rate 17, height 5' 6.93" (1.7 m), weight 354 lb 14.4 oz (160.982 kg), SpO2 97.00%. Physical Exam: General: morbidly obese white female who is laying in bed in NAD Heart: irregular.  Normal s1,s2. No obvious murmurs, gallops, or rubs noted.  Palpable radial and pedal pulses bilaterally Lungs: CTAB, no wheezes, rhonchi, or rales noted.  Respiratory effort nonlabored Abd: soft, NT, ND, +BS, no masses, hernias, or organomegaly Skin: warm and dry with no masses, lesions, or rashes.  Her lower extremities have multiple wounds present.  She has a healing wound on her right inner distal thigh.  This is packed.  She has 3 other wounds that are all eschars.  These are located on her right hip, left posterior thigh and left lateral thigh.  The left posterior thigh wound is softer than the others and appears to be a thicker eschar than the rest.  There is some erythema around this wound.  Please see WOC note for exact measurements of each of these wounds.  Psych: A&Ox3 with an appropriate affect.    Results for orders placed during the hospital encounter of 09/09/14 (from the past 48 hour(s))  GLUCOSE, CAPILLARY     Status: Abnormal   Collection Time    09/27/14  4:24 PM      Result Value Ref Range   Glucose-Capillary 203 (*) 70 - 99 mg/dL   Comment 1 Notify RN     Comment  2 Documented in Chart    GLUCOSE, CAPILLARY     Status: Abnormal   Collection Time    09/27/14  9:21 PM      Result Value Ref Range   Glucose-Capillary 243 (*) 70 - 99 mg/dL   Comment 1 Documented in Chart     Comment 2 Notify RN    PROTIME-INR     Status: Abnormal   Collection Time    09/28/14  5:33 AM      Result Value Ref Range   Prothrombin Time 18.7 (*) 11.6 - 15.2 seconds   INR 1.56 (*) 0.00 - 1.49  RENAL FUNCTION PANEL     Status: Abnormal   Collection Time    09/28/14  5:33 AM      Result Value Ref Range   Sodium 130 (*) 137 - 147 mEq/L   Potassium 4.0  3.7 - 5.3 mEq/L    Chloride 92 (*) 96 - 112 mEq/L   CO2 24  19 - 32 mEq/L   Glucose, Bld 200 (*) 70 - 99 mg/dL   BUN 95 (*) 6 - 23 mg/dL   Creatinine, Ser 2.95 (*) 0.50 - 1.10 mg/dL   Calcium 7.5 (*) 8.4 - 10.5 mg/dL   Phosphorus 5.6 (*) 2.3 - 4.6 mg/dL   Albumin 1.4 (*) 3.5 - 5.2 g/dL   GFR calc non Af Amer 16 (*) >90 mL/min   GFR calc Af Amer 18 (*) >90 mL/min   Comment: (NOTE)     The eGFR has been calculated using the CKD EPI equation.     This calculation has not been validated in all clinical situations.     eGFR's persistently <90 mL/min signify possible Chronic Kidney     Disease.   Anion gap 14  5 - 15  CBC     Status: Abnormal   Collection Time    09/28/14  5:33 AM      Result Value Ref Range   WBC 7.2  4.0 - 10.5 K/uL   RBC 2.34 (*) 3.87 - 5.11 MIL/uL   Hemoglobin 7.5 (*) 12.0 - 15.0 g/dL   HCT 22.4 (*) 36.0 - 46.0 %   MCV 95.7  78.0 - 100.0 fL   MCH 32.1  26.0 - 34.0 pg   MCHC 33.5  30.0 - 36.0 g/dL   RDW 18.0 (*) 11.5 - 15.5 %   Platelets 192  150 - 400 K/uL  MAGNESIUM     Status: None   Collection Time    09/28/14  5:33 AM      Result Value Ref Range   Magnesium 1.9  1.5 - 2.5 mg/dL  GLUCOSE, CAPILLARY     Status: Abnormal   Collection Time    09/28/14  5:54 AM      Result Value Ref Range   Glucose-Capillary 222 (*) 70 - 99 mg/dL   Comment 1 Documented in Chart     Comment 2 Notify RN    GLUCOSE, CAPILLARY     Status: Abnormal   Collection Time    09/28/14 11:04 AM      Result Value Ref Range   Glucose-Capillary 186 (*) 70 - 99 mg/dL  GLUCOSE, CAPILLARY     Status: Abnormal   Collection Time    09/28/14  4:11 PM      Result Value Ref Range   Glucose-Capillary 196 (*) 70 - 99 mg/dL  GLUCOSE, CAPILLARY     Status: Abnormal   Collection Time  09/28/14  9:40 PM      Result Value Ref Range   Glucose-Capillary 213 (*) 70 - 99 mg/dL  GLUCOSE, CAPILLARY     Status: Abnormal   Collection Time    09/29/14  6:01 AM      Result Value Ref Range   Glucose-Capillary 189  (*) 70 - 99 mg/dL  PROTIME-INR     Status: Abnormal   Collection Time    09/29/14  6:10 AM      Result Value Ref Range   Prothrombin Time 19.1 (*) 11.6 - 15.2 seconds   INR 1.60 (*) 0.00 - 1.49  MAGNESIUM     Status: None   Collection Time    09/29/14  6:10 AM      Result Value Ref Range   Magnesium 1.9  1.5 - 2.5 mg/dL  CBC     Status: Abnormal   Collection Time    09/29/14  6:10 AM      Result Value Ref Range   WBC 6.6  4.0 - 10.5 K/uL   RBC 2.54 (*) 3.87 - 5.11 MIL/uL   Hemoglobin 8.3 (*) 12.0 - 15.0 g/dL   HCT 25.3 (*) 36.0 - 46.0 %   MCV 99.6  78.0 - 100.0 fL   MCH 32.7  26.0 - 34.0 pg   MCHC 32.8  30.0 - 36.0 g/dL   RDW 18.2 (*) 11.5 - 15.5 %   Platelets 189  150 - 400 K/uL  RENAL FUNCTION PANEL     Status: Abnormal   Collection Time    09/29/14  6:10 AM      Result Value Ref Range   Sodium 131 (*) 137 - 147 mEq/L   Potassium 3.8  3.7 - 5.3 mEq/L   Chloride 92 (*) 96 - 112 mEq/L   CO2 25  19 - 32 mEq/L   Glucose, Bld 160 (*) 70 - 99 mg/dL   BUN 95 (*) 6 - 23 mg/dL   Creatinine, Ser 2.91 (*) 0.50 - 1.10 mg/dL   Calcium 7.4 (*) 8.4 - 10.5 mg/dL   Phosphorus 5.5 (*) 2.3 - 4.6 mg/dL   Albumin 1.4 (*) 3.5 - 5.2 g/dL   GFR calc non Af Amer 16 (*) >90 mL/min   GFR calc Af Amer 18 (*) >90 mL/min   Comment: (NOTE)     The eGFR has been calculated using the CKD EPI equation.     This calculation has not been validated in all clinical situations.     eGFR's persistently <90 mL/min signify possible Chronic Kidney     Disease.   Anion gap 14  5 - 15  GLUCOSE, CAPILLARY     Status: Abnormal   Collection Time    09/29/14 11:55 AM      Result Value Ref Range   Glucose-Capillary 157 (*) 70 - 99 mg/dL   Comment 1 Notify RN     No results found.     Assessment/Plan 1. Multiple bilateral thigh and buttock/hip wounds 2. Multiple other medical problems  Plan: 1. Most of these eschars appear to be thin.  We would like to avoid taking this patient to the OR if possible  given her multitude of medical problems.  We will start with santyl and hydrotherapy to each of these 3 sites.  We will hold off on debridement right now and follow progress with hydrotherapy and conservative management.  Juliya Magill E 09/29/2014, 4:16 PM Pager: [phone]

## 2014-09-29 NOTE — Progress Notes (Addendum)
The patient was seen and examined. This is not a situation where inotropic therapy will be helpful. Plan as outlined in this note. Continue IV diuresis until anasarca resolved. This would be on the direction of nephrology. Rate control for atrial fibrillation.

## 2014-09-29 NOTE — Consult Note (Addendum)
WOC consult has been performed multiple times during this admission.  Refer to previous progress notes.  Buttock and outer thigh wounds have declined since previous assessment.  Pt has repeatedly refused to let an air mattress be ordered and has been resistant to turning. She has also not allowed adhesive protective dressings to be applied.  Reason for Consult: Re-consult requested for multiple wounds.  Pt had debridement of bilat inner thigh wounds by [facility] team a few months ago. They are following her as an outpatient and have ordered Santyl Q day. Left and right anterior thigh wounds unchanged in appearance since previous assessment; 20% yellow, 80% red, small amt yellow drainage, no odor. Left thigh full thickness wound: 2X4X1cm, Right thigh full thickness 1X2X1cm, 80% red, 20% yellow, small amt yellow drainage, strong odor.  Wound type: Left thigh full thickness wound: Has evolved into unstageable; 100% loose slough, painful to touch, 5X5cm, mod amt yellow drainage, no odor.  Left posterior thigh full thickness wound: Has evolved into unstageable; 100% loose slough, painful to touch, 8X6cm, mod amt yellow drainage, no odor. Very fluctuant to touch and surrounded by tracking erythremia to 5 cm surrounding. Right hip full thickness Has evolved into unstageable; 100% loose slough, painful to touch, 7X3cm, mod amt yellow drainage, no odor.   Measurement: Left outer calf with partial thickness stasis ulcers; 1X1.5X.1cm 1X2X.1cm, both are red and scabbed without odor or drainage. These are dry and improved since previous assessment, no topical care indicated.  Stage 2 wound to left buttock; 3.X2X.1cm; pink granulation buds visible interspersed with yellow. Yellow drainage from all open areas R/T large amt fluid loss. Dark red deep tissue injuries to right buttock; 3X3cm and 3X2cm  Pt states she is allergic to foam silicone dressings and adhesive tape. Will respect wish to avoid foam dressings; barrier cream  and non-adherent dressing applied to area with out tape to protect and repel moisture.  Continue Santyl  for chemical debridement.  Recommend surgical consult to assess for debridement of left posterior thigh, left outer thigh, and right hip.  Pt has high BMI and is remaining very moist to buttocks. Difficult to reposition and refuses offer of air mattress or bariatric air bed which would increase airflow and decrease pressure to buttocks and outer posterior thigh wounds. Explained that this would be helpful to promote healing.  This visit, she reluctantly agreed to ordering a bariatric air bed to reduce pressure and increase airflow.  Discussed plan of care with primary team, Dr Wendee Beavers plans to consult [facility] for further input.  Also discussed plan of care with patient and her family member at bedside, she verbalizes understanding.  Please re-consult if further assistance is needed. Thank-you,  Julien Girt MSN, [place], [place], [place], [place]

## 2014-09-29 NOTE — Progress Notes (Signed)
[facility] Rounding Note   Subjective:  Pt seen and examined in AM. No acute events overnight. She reports her swelling is a little better and is able to move her feet. She received IV lasix 160 mg BID yesterday. Her blood pressure has improved today to 116/75 and renal function is stable. She denies lightheadednesss, dyspnea, nausea, vomiting, abdominal pain, or change in BM. Her tremors have improved.   Objective:    General: Resting comfortably in bed Cardiac: Irregularly irregular rhythm with normal rate. Murmur present. Pulmonary: Clear to auscultation bilaterally with no rales, ronchi, or wheezing GI: Soft, non-tender, non-distended with normal BS Extremities: +3/4 pitting b/l LE edema and b/l UE edema. Mild erythema and tenderness to touch of LLE.  Vital signs in last 24 hours: Filed Vitals:   09/28/14 0434 09/28/14 1300 09/28/14 2146 09/29/14 0609  BP: 80/42 84/42 89/47  116/75  Pulse: 85 90 93 83  Temp: 99.1 F (37.3 C) 98.5 F (36.9 C) 99 F (37.2 C) 97.8 F (36.6 C)  TempSrc: Oral Oral Oral Oral  Resp: 20 18 18 18   Height:      Weight: 353 lb 12.8 oz (160.483 kg)   354 lb 14.4 oz (160.982 kg)  SpO2: 93% 94% 91% 94%   Weight change: 1 lb 1.6 oz (0.499 kg)  Intake/Output Summary (Last 24 hours) at 09/29/14 0734 Last data filed at 09/29/14 0612  Gross per 24 hour  Intake    720 ml  Output   1200 ml  Net   -480 ml    Physical Exam:  Blood pressure 116/75, pulse 83, temperature 97.8 F (36.6 C), temperature source Oral, resp. rate 18, height 5' 6.93" (1.7 m), weight 354 lb 14.4 oz (160.982 kg), SpO2 94.00%.  Labs:   Recent Labs Lab 09/23/14 0535 09/24/14 0645 09/25/14 0539 09/26/14 0523 09/27/14 0529 09/28/14 0533 09/29/14 0610  NA 132* 133* 131* 132* 130* 130* 131*  K 4.5 4.4 4.6 4.3 4.2 4.0 3.8  CL 94* 96 93* 94* 93* 92* 92*  CO2 26 27 26 25 25 24 25   GLUCOSE 114* 131* 186* 154* 170* 200* 160*  BUN 64* 66* 71* 76* 86* 95* 95*   CREATININE 2.09* 2.20* 2.44* 2.63* 2.80* 2.95* 2.91*  CALCIUM 8.1* 8.2* 8.0* 7.9* 7.6* 7.5* 7.4*  PHOS  --   --   --   --   --  5.6* 5.5*     Recent Labs Lab 09/28/14 0533  ALBUMIN 1.4*   No results found for this basename: LIPASE, AMYLASE,  in the last 168 hours No results found for this basename: AMMONIA,  in the last 168 hours   Recent Labs Lab 09/24/14 0645 09/27/14 0529 09/28/14 0533 09/29/14 0610  WBC 5.6 6.8 7.2 6.6  HGB 8.5* 7.3* 7.5* 8.3*  HCT 26.4* 22.1* 22.4* 25.3*  MCV 97.4 96.9 95.7 99.6  PLT 266 199 192 189    @LABRCNTIP (inr:5)  )No results found for this basename: CKTOTAL, CKMB, CKMBINDEX, TROPONINI,  in the last 168 hours   Recent Labs Lab 09/28/14 0554 09/28/14 1104 09/28/14 1611 09/28/14 2140 09/29/14 0601  GLUCAP 222* 186* 196* 213* 189*       Recent Labs Lab 09/27/14 0925  IRON 48  TIBC 162*  FERRITIN 632*    Studies/Results: No results found.    Marland Kitchen amiodarone  400 mg Oral Daily  . calcitRIOL  0.25 mcg Oral BID  . calcium-vitamin D  1 tablet Oral BID  . collagenase  Topical Daily  . febuxostat  80 mg Oral Daily  . furosemide  160 mg Intravenous BID  . insulin aspart  0-15 Units Subcutaneous TID WC  . insulin aspart  0-5 Units Subcutaneous QHS  . insulin aspart  2 Units Subcutaneous TID WC  . insulin glargine  22 Units Subcutaneous QHS  . iron polysaccharides  150 mg Oral BID  . levothyroxine  125 mcg Oral QAC breakfast  . linagliptin  5 mg Oral Daily  . metolazone  2.5 mg Oral QODAY  . multivitamin with minerals  1 tablet Oral Daily  . mupirocin cream   Topical Daily  . potassium chloride  20 mEq Oral Daily  . saccharomyces boulardii  250 mg Oral BID  . sodium chloride  3 mL Intravenous Q12H  . Warfarin - Pharmacist Dosing Inpatient   Does not apply q1800     I  have reviewed scheduled and prn medications.  Background: [name] is an 64 y.o. 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   Recommendations:   Anasarca in setting of Cardiorenal Syndrome & Hypoalbuminemia - Pt with chronic CHF with recent EF 35-40% due to NICM. She continues to have significant volume overload with weight 24 lb above baseline (330 lb) She had 35 lb weight loss and improved renal function 3.53 to 2.2 on IV lasix 80 mg BID from 9/14 to 9/25. After transition to PO metolazone (2.5 mg) and torsemide (100 mg BID) on 9/25- 26 both her volume status (7 lb wt increase) and renal function (Cr 2.2 to 2.8) declined. Currently on IV lasix at 160 mg BID (started on 9/29) and metolazone 2.5 mg QOD. Her blood pressure has improved today and renal function is stable. I am hopeful for more improvement of her edema on IV lasix therapy before transition to PO lasix. Will closely monitor volume status on aggressive diuresis and adjust as necessary as blood pressure and renal function tolerates.   CKD Stage IV in setting of right solitary kidney - Cr 2.91 today stable from 2.95 yesterday at baseline 2.5-3.0 with Cr on admission of 3.38, improved to 1.90 after IV lasix and worsened to 2.80 after PO diuretics were initiated. Pt with adequate urine output with last UA with no proteinuria or hamaturia. Continue renal function monitoring, daily weights, strict I/O's, renal/fluid restricted diet, and avoidance of nephrotoxins. No indication for HD at this time.  Acute Exacerbation of Chronic Systolic CHF in setting of NICM - Last 2D-echo on 9/13 with EF 35-40% and diffuse hypokinesis, severe tricuspid regurgitation, and pulmonary hypertension. See plan for above. Obtain  pro-BNP. Hyperphosphatemia - Phos 5.5 today with corrected Ca 9.5. Consider calcium-based phosphate binder with meals. Follow-up PTH level.  Anemia of Chronic Disease - Improved to Hg 8.3 today, below baseline 9. She is s/p 1pRBC on 9/22.  Anemia panel with iron sat 30%.  Caution with transfusion in setting of volume overload.  Avoid IV iron in setting of recent infection. Chronic Asymptomatic Hypervolemic Hyponatremia - Na 131 today. Continue aggressive diuresis therapy in setting of anasarca from cardiorenal syndrome.  Left LE Cellulitis with Group B Strep Bacteremia - Pt has  completed antibiotic course (cefazolin, vanc, cipro, flagy, and ancef) during current hospitalization. Repeat blood cultures negative.  Atrial Fibrillation - Pt on amiodarone and coumadin per pharmacy. INR (1.6) subtherapeutic today.  Insulin Dependent Type II DM - Last A1c 7. Management per primary team.  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  Morbid Obesity - Pt with OSA and OHS on CPAP as tolerated at night.    Orlin Hilding Pager [phone]  Attending note to follow

## 2014-09-29 NOTE — Progress Notes (Addendum)
ANTICOAGULATION CONSULT NOTE - Follow Up Consult  Pharmacy Consult:  Coumadin Indication: atrial fibrillation  Allergies  Allergen Reactions  . Ivp Dye [Iodinated Diagnostic Agents] Shortness Of Breath    Turn red, can't breathe  . Betadine [Povidone Iodine]   . Diphenhydramine Hcl Swelling    In hands and eyes  . Fish Allergy Nausea And Vomiting  . Iohexol      Desc: PT TURNS RED AND WHEEZING   . Penicillins Other (See Comments)    Resp arrest as child. Tolerates cephalosporins.  . Povidone-Iodine Other (See Comments)    Wheezing, turn red, can't breath, and blisters  . Promethazine Hcl Other (See Comments)    Low Blood Pressure  . Tape     Blisters, can use paper tape for short periods  . Clindamycin Hives and Rash    wheezing  . Iodine Rash  . Morphine Sulfate Nausea And Vomiting and Rash  . Primaxin [Imipenem] Rash    Patient Measurements: Height: 5' 6.93" (170 cm) Weight: 354 lb 14.4 oz (160.982 kg) IBW/kg (Calculated) : 61.44  Vital Signs: Temp: 97.7 F (36.5 C) (09/30 1406) Temp src: Oral (09/30 1406) BP: 81/44 mmHg (09/30 1406) Pulse Rate: 79 (09/30 1406)  Labs:  Recent Labs  09/27/14 0529 09/28/14 0533 09/29/14 0610  HGB 7.3* 7.5* 8.3*  HCT 22.1* 22.4* 25.3*  PLT 199 192 189  LABPROT 19.0* 18.7* 19.1*  INR 1.59* 1.56* 1.60*  CREATININE 2.80* 2.95* 2.91*    Estimated Creatinine Clearance: 30.4 ml/min (by C-G formula based on Cr of 2.91).  Assessment: 50 YOF continues on Coumadin from PTA for history of Afib. Home regimen: 5 mg TTSS, 2.5 mg MWF.  INR supra-therapeutic (3.4) on admit, then peaked at 5 likely due to drug-drug interactions with Cipro and Flagyl.  She then received Vitamin K 1mg  IV x 1 on 09/12/14.   She required lower doses than her home regimen during this admission d/t high INRs. As a result of this, her INR dropped to below goal. Has now had Coumadin 5 mg daily x 2 days, then 7.5 mg on 9/29. INR about the same at 1.60. Hgb 7.5>8.3.   Has active order for Niferex BID, but has often been refusing doses, but has now taken for the last few days. Also refusing Oscal D and MVI.  Goal of Therapy:  INR 2-3 Monitor platelets by anticoagulation protocol: Yes  Plan:   Coumadin 7.5 mg x 1 again today.  Continue daily PT/INR.  FYI:  Patient reports that she can only take brand-name Coumadin, which is what we stock.  Hx erratic INRs and bleeding several years ago, when she tried generic warfarin.  Arty Baumgartner, [place] Pager: [phone] 09/29/2014, 2:26 PM

## 2014-09-29 NOTE — Progress Notes (Signed)
[facility] PROGRESS NOTE  ZONA PEDRO WJX:[id number] DOB: 1950-07-10 DOA: 09/09/2014 PCP: Elby Showers, MD   Assessment/Plan:   Septic shock due to Left lower leg cellulitis:  - Initially on Vanc/CIpro/FLagyl from 9/11  - changed to IV cefazolin from 9/14  - blood culture 1/2 group B strep, completed 14 days of antibiotics, between Vanc and ancef  - Ancef stopped 9/24  - Wound care consult in place.  Currently recommending general surgery consult for surgical debridement. Page sent out for general surgery consult.  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- EF 30-35% June 2014  -remains volume overload, anasarca, third spacing due to severe hypoalbuminemia and CKD3-4  -was being diuresed with IV lasix, changed to Po demadex and metolazone per Cards over weekend  -creatinine trending up, baseline 2, negative 18.3L, but weight still trending up despite being net negative everyday  -no ACE due to AKI  -baseline dry weight unclear around 330lbs -Renal on board amd now on High dose IV lasix per dr.Webb   Hypotension - Will provide a 250 cc fluid bolus and reassess  RUE swelling  -improved  -dopplers 9/22 negative, due to third spacing   Type 2 diabetes mellitus:  - A1c 7.0  - lantus increased to 22 units.  - SSI, blood sugars relatively controlled on this regimen  . PAF (paroxysmal atrial fibrillation)  - Currently with a paced rhythm  - Coumadin dosed by pharmacy -  continue amiodarone   . Hypothyroidism, acquired, autoimmune: Recent TSH was 2.12 on 05/28/14.  - Continue levothyroxine   Gout: stable  -Continue Uloric   . AKI on CKD (chronic kidney disease) stage 4, GFR 15-29 ml/min: ccl slightly higher from baseline 2.5-3.00 to 3.38 on admission  - Currently at baseline  -Followed by Dr.Dunham, Renal consulted given overloaded state with CKD, CHF and Hypoalbuminemia  -now on high dose of IV lasix and metolazone   . PACEMAKER,  PERMANENT  . Obesity hypoventilation syndrome  - CPAP QHS   . OBESITY, MORBID  Tremors/shakes  -gabapentin stopped, monitor  -improving   Anemia:  -due to CKD and acute illness and iron deficiency -baseline around 9, transfused 2units PRBC 9/22  -Anemia panel c/w Chronic disease, transfuse if Hb <7   Code Status: full  Family Communication: patient  Disposition Plan: SNF when stable  Consultants:  PCCM  Cards Nephrology   Procedures:  Central venous catheter insertion procedure on 09/10/2049   Code Status: full Family Communication: discussed with patient directly Disposition Plan: pending recommendations from specialist involved.   HPI/Subjective: Wound care nurse reported that patient most likely needed surgical debridement at lesions of her legs bilaterally.  Objective: Filed Vitals:   09/29/14 1406  BP: 81/44  Pulse: 79  Temp: 97.7 F (36.5 C)  Resp: 17    Intake/Output Summary (Last 24 hours) at 09/29/14 1519 Last data filed at 09/29/14 1425  Gross per 24 hour  Intake    720 ml  Output   1401 ml  Net   -681 ml   Filed Weights   09/27/14 0419 09/28/14 0434 09/29/14 0609  Weight: 160.12 kg (353 lb) 160.483 kg (353 lb 12.8 oz) 160.982 kg (354 lb 14.4 oz)    Exam:   General:  Patient in no acute distress, alert and awake  Cardiovascular: Pink extremities, no cyanosis  Respiratory: No audible wheezes, equal chest rise  Abdomen: Nontender, nondistended  Data Reviewed: Basic Metabolic Panel:  Recent Labs Lab 09/25/14 0539 09/26/14 0523  09/27/14 0529 09/28/14 0533 09/29/14 0610  NA 131* 132* 130* 130* 131*  K 4.6 4.3 4.2 4.0 3.8  CL 93* 94* 93* 92* 92*  CO2 26 25 25 24 25   GLUCOSE 186* 154* 170* 200* 160*  BUN 71* 76* 86* 95* 95*  CREATININE 2.44* 2.63* 2.80* 2.95* 2.91*  CALCIUM 8.0* 7.9* 7.6* 7.5* 7.4*  MG  --   --   --  1.9 1.9  PHOS  --   --   --  5.6* 5.5*   Liver Function Tests:  Recent Labs Lab 09/28/14 0533  09/29/14 0610  ALBUMIN 1.4* 1.4*   No results found for this basename: LIPASE, AMYLASE,  in the last 168 hours No results found for this basename: AMMONIA,  in the last 168 hours CBC:  Recent Labs Lab 09/24/14 0645 09/27/14 0529 09/28/14 0533 09/29/14 0610  WBC 5.6 6.8 7.2 6.6  HGB 8.5* 7.3* 7.5* 8.3*  HCT 26.4* 22.1* 22.4* 25.3*  MCV 97.4 96.9 95.7 99.6  PLT 266 199 192 189   Cardiac Enzymes: No results found for this basename: CKTOTAL, CKMB, CKMBINDEX, TROPONINI,  in the last 168 hours BNP (last 3 results) No results found for this basename: PROBNP,  in the last 8760 hours CBG:  Recent Labs Lab 09/28/14 1104 09/28/14 1611 09/28/14 2140 09/29/14 0601 09/29/14 1155  GLUCAP 186* 196* 213* 189* 157*    Recent Results (from the past 240 hour(s))  CLOSTRIDIUM DIFFICILE BY PCR     Status: None   Collection Time    09/20/14  9:04 AM      Result Value Ref Range Status   C difficile by pcr NEGATIVE  NEGATIVE Final     Studies: No results found.  Scheduled Meds: . amiodarone  400 mg Oral Daily  . calcitRIOL  0.25 mcg Oral BID  . calcium-vitamin D  1 tablet Oral BID  . collagenase   Topical Daily  . febuxostat  80 mg Oral Daily  . furosemide  160 mg Intravenous BID  . insulin aspart  0-15 Units Subcutaneous TID WC  . insulin aspart  0-5 Units Subcutaneous QHS  . insulin aspart  2 Units Subcutaneous TID WC  . insulin glargine  22 Units Subcutaneous QHS  . iron polysaccharides  150 mg Oral BID  . levothyroxine  125 mcg Oral QAC breakfast  . linagliptin  5 mg Oral Daily  . metolazone  2.5 mg Oral QODAY  . multivitamin with minerals  1 tablet Oral Daily  . potassium chloride  20 mEq Oral Daily  . saccharomyces boulardii  250 mg Oral BID  . sodium chloride  250 mL Intravenous Once  . sodium chloride  3 mL Intravenous Q12H  . warfarin  7.5 mg Oral ONCE-1800  . 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   Obesity hypoventilation syndrome-    Cardiomyopathy, nonischemic- EF 30-35% June 2014   Type 2 diabetes mellitus   Chronic combined systolic and diastolic CHF   Long term (current) use of anticoagulants   CKD (chronic kidney disease) stage 4, GFR 15-29 ml/min   PAF- recurrent despite Amiodarone and multiple cardioversions   Mitral insufficiency, moderate to severe   Chronic massive bilat LE lymphadema   Fall at home- 3 falls  this summer, sound orthostatic   Hypotension   Acute on chronic renal failure   Coarse tremors    Time spent: > 35 minutes    Velvet Bathe  [facility] Pager [phone] If 7PM-7AM, please contact night-coverage at [email or web address], password [email or web address] 09/29/2014, 3:19 PM  LOS: 20 days

## 2014-09-29 NOTE — Progress Notes (Signed)
Subjective: Up on side of bed with 3 PT employees with plans to stand.  She seems brighter today.  Objective: Vital signs in last 24 hours: Temp:  [97.8 F (36.6 C)-99 F (37.2 C)] 97.8 F (36.6 C) (09/30 0609) Pulse Rate:  [83-93] 83 (09/30 0609) Resp:  [18] 18 (09/30 0609) BP: (84-116)/(42-75) 116/75 mmHg (09/30 0609) SpO2:  [91 %-94 %] 94 % (09/30 0609) Weight:  [354 lb 14.4 oz (160.982 kg)] 354 lb 14.4 oz (160.982 kg) (09/30 0609) Weight change: 1 lb 1.6 oz (0.499 kg) Last BM Date: 09/28/14 Intake/Output from previous day: -480 (since admit -18,892)  Wt up 1 pound  09/29 0701 - 09/30 0700 In: 61 [P.O.:720] Out: 1200 [Urine:1200] Intake/Output this shift:    PE: General:Pleasant affect, NAD, sitting on side of bed with help Skin:Warm and dry, brisk capillary refill HEENT:normocephalic, sclera clear, mucus membranes moist Heart:irreg irreg  With  murmur, no gallup, rub or click Lungs:clear without rales, rhonchi, or wheezes UKG:URKY, non tender, + BS, do not palpate liver spleen or masses Ext:++ lower ext edema open ulcers on legs,slowly improving  2+ radial pulses Neuro:alert and oriented, MAE, follows commands, + facial symmetry   tele: a fib with occ pacing rates in the 80-90s  Lab Results:  Recent Labs  09/28/14 0533 09/29/14 0610  WBC 7.2 6.6  HGB 7.5* 8.3*  HCT 22.4* 25.3*  PLT 192 189   BMET  Recent Labs  09/27/14 0529 09/28/14 0533  NA 130* 130*  K 4.2 4.0  CL 93* 92*  CO2 25 24  GLUCOSE 170* 200*  BUN 86* 95*  CREATININE 2.80* 2.95*  CALCIUM 7.6* 7.5*   No results found for this basename: TROPONINI, CK, MB,  in the last 72 hours  Lab Results  Component Value Date   CHOL 149 11/30/2013   HDL 33* 11/30/2013   LDLCALC 89 11/30/2013   TRIG 137 11/30/2013   CHOLHDL 4.5 11/30/2013   Lab Results  Component Value Date   HGBA1C 7.0* 03/05/2014     Lab Results  Component Value Date   TSH 2.121 05/28/2014    Hepatic Function  Panel  Recent Labs  09/28/14 0533  ALBUMIN 1.4*        Studies/Results: No results found.  Medications: I have reviewed the patient's current medications. Scheduled Meds: . amiodarone  400 mg Oral Daily  . calcitRIOL  0.25 mcg Oral BID  . calcium-vitamin D  1 tablet Oral BID  . collagenase   Topical Daily  . febuxostat  80 mg Oral Daily  . furosemide  160 mg Intravenous BID  . insulin aspart  0-15 Units Subcutaneous TID WC  . insulin aspart  0-5 Units Subcutaneous QHS  . insulin aspart  2 Units Subcutaneous TID WC  . insulin glargine  22 Units Subcutaneous QHS  . iron polysaccharides  150 mg Oral BID  . levothyroxine  125 mcg Oral QAC breakfast  . linagliptin  5 mg Oral Daily  . metolazone  2.5 mg Oral QODAY  . multivitamin with minerals  1 tablet Oral Daily  . mupirocin cream   Topical Daily  . potassium chloride  20 mEq Oral Daily  . saccharomyces boulardii  250 mg Oral BID  . sodium chloride  3 mL Intravenous Q12H  . Warfarin - Pharmacist Dosing Inpatient   Does not apply q1800   Continuous Infusions:  PRN Meds:.albuterol, ALPRAZolam, calcium carbonate, HYDROcodone-acetaminophen, hydrOXYzine, nitroGLYCERIN, sodium chloride, sodium  chloride, sodium chloride   Assessment/Plan: [name] is an 64 y.o. 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   Acute on Chronic Systolic HF: recent EF 41-74% due to NICM  still significantly volume overloaded with increased RUE edema yesterday. Anasarca  -- Crea continues to increase 2.2 --> 2.4-->2.6--> 2.8 --> 2.95 with torsemide and metolazone now with renal  consult on IV lasix 160 mg BID and metolazone. She has single kidney.  -- She refuses HD.  -- The patient is adamant about HD. Diuretics don't seem to work and her Creat is worsening. She has low protein and is third spacing fluid into subcutaneous tissues. On metalazone every other day and torsemide 100mg  BID  -- Net neg 18L. Weights all over the board. But weight increased in past couple days 343--> 353 --> 353.12--> 354  -- IM has consulted nephrology they are managing fluid  Persistent A-fib: rate controlled. Continue amiodarone, but reduced to once daily dosing yesterday. Recently failed DCCV  successfully cardioverted  And back into a fib after 2 months. She was again cardioverted successfully in June. Back in a fib by June 28 on pacer check.  Now plan to cardiovert until diuresed.   -- AC with coumadin. INR 1.60 today. Coumadin was previously held due to anemia but then restarted s/p transfusion. Now Hg 8.3 today  -- Dosing per pharmacy   Cellulitis of the LEE: she has completed course of antibiotics. Mild low grade fever none in last 18 hours.   Anemia: s/p transfusion. Hgb went from 7.3 to 8.5. Now trending downwards to 7.3--> 7.5 today -->8.3   CKD: Scr has increased from 2.2--> 2.8-->2.95. Continue to monitor.  Today's lab is pending.  Tremors: patient complaining of significant tremor since being in ICU. Gabapentin was recently started and can cause tremor. Dr Meda Coffee recommended this be discontinued. This was stopped yesterday with no significant improvement in her tremors. But tremors have improved off of torsemide.        LOS: 20 days   Time spent with pt. :15 minutes. [facility] R  Nurse Practitioner Certified Pager [phone] or after 5pm and on weekends call [phone] 09/29/2014, 8:05 AM

## 2014-09-29 NOTE — Progress Notes (Signed)
I have seen and examined this patient and agree with the plan of care   [name] W 09/29/2014, 12:35 PM  

## 2014-09-29 NOTE — Progress Notes (Signed)
Physical Therapy Treatment Patient Details Name: [name] MRN: [id number] DOB: 09-15-1950 Today's Date: 09/29/2014    History of Present Illness 64 y.o. female  with history of fibromyalgia, hypothyroidism, solitary kidney, hypothyroidism, diabetes, nonischemic cardiomyopathy with ejection fraction of 35%, atrial fibrillation who presented to the emergency department with left-sided lower leg pain. She was admitted on 9/10 w/ recurrent Lower extremity cellulitis. PCCM asked to see in consult on 9/11 for persistent hypotension and worsening renal failure.  She had a previous admission for LE cellulitis in 07/2014 followed by [facility].  Pt had returned home with HHPT prior to this admission.     PT Comments    Pt admitted with above. Pt currently with functional limitations due to balance and endurance deficits.  Pt will benefit from skilled PT to increase their independence and safety with mobility to allow discharge to the venue listed below.   Follow Up Recommendations  SNF;Supervision/Assistance - 24 hour     Equipment Recommendations  None recommended by PT    Recommendations for Other Services       Precautions / Restrictions Precautions Precautions: Fall Restrictions Weight Bearing Restrictions: No    Mobility  Bed Mobility Overal bed mobility: Needs Assistance;+2 for physical assistance Bed Mobility: Rolling Rolling: Mod assist;+2 for physical assistance;Max assist   Supine to sit: Min assist;+2 for physical assistance Sit to supine: +2 for physical assistance;Total assist   General bed mobility comments: Pt assisted a little more with rolling and getting OOB today.    Transfers Overall transfer level: Needs assistance Equipment used: Rolling walker (2 wheeled) Transfers: Sit to/from Stand Sit to Stand: Mod assist;+2 physical assistance (with third person for safety.)         General transfer comment: +3 for physical assist for sit - stand from EOB with  support from RW. Pt able to stand x 3 (15 seconds, 25 seconds and 20 seconds).  Pt's LEs were extended fully with therapist having pillows in front of her knees since her knees were sore.  Pt needed cuing to stand tall and use UEs as she tends to lean over onto her forearms.  Most difficulty was talking pt into standing however once pt attempted, she was encouraged.    Ambulation/Gait                 Stairs            Wheelchair Mobility    Modified Rankin (Stroke Patients Only)       Balance Overall balance assessment: Needs assistance;History of Falls Sitting-balance support: Single extremity supported;Feet supported Sitting balance-Leahy Scale: Fair Sitting balance - Comments: pt able to wash and dry face seated at EOB.  Caution used as pt leans at times and monitoring for pt to not slide off bed.     Standing balance support: Bilateral upper extremity supported;During functional activity Standing balance-Leahy Scale: Poor Standing balance comment: Needed mod assist of 3  and RW to stand at EOB.                      Cognition Arousal/Alertness: Awake/alert Behavior During Therapy: Anxious Overall Cognitive Status: Within Functional Limits for tasks assessed                      Exercises General Exercises - Lower Extremity Ankle Circles/Pumps: AROM;Strengthening;Both;15 reps;Supine Long Arc Quad: AAROM;Both;10 reps;Seated    General Comments General comments (skin integrity, edema, etc.): Positioned to protect skin  and edema.        Pertinent Vitals/Pain Pain Assessment: Faces Faces Pain Scale: Hurts whole lot Pain Location: bil LEs Pain Descriptors / Indicators: Aching;Pressure;Sore;Tightness Pain Intervention(s): Limited activity within patient's tolerance;Monitored during session;Premedicated before session;Repositioned VSS    Home Living                      Prior Function            PT Goals (current goals can now be  found in the care plan section) Progress towards PT goals: Progressing toward goals    Frequency  Min 2X/week    PT Plan Current plan remains appropriate    Co-evaluation             End of Session Equipment Utilized During Treatment: Gait belt Activity Tolerance: Patient limited by fatigue;Patient limited by pain Patient left: in bed;with call bell/phone within reach;with family/visitor present     Time: 4680-3212 PT Time Calculation (min): 82 min  Charges:  $Therapeutic Exercise: 8-22 mins $Therapeutic Activity: 23-37 mins                    G Codes:      INGOLD,Lindley Hiney 09-30-14, 2:05 PM  [facility] [phone] (pager)

## 2014-09-29 NOTE — Progress Notes (Signed)
Patients buttocks and posterior thighs with multiple ulcerations and worsening excoriation.  Patient is largely immobile, does not tolerate turning q2 hours, and is unable to get OOB.  She has refused the specialty low air loss mattress multiple times due to a negative past experience with these bed.  Discussed with patient the benefits of specialty bed and that the condition of her skin will continue to worsen without it.  Patient verbalized understanding and agreed to try the specialty bed.  Will notify MD and request another visit from [place] Nurse.

## 2014-09-29 NOTE — Progress Notes (Signed)
Occupational Therapy Treatment Patient Details Name: [name] MRN: [id number] DOB: [birth year] Today's Date: 09/29/2014    History of present illness 64 y.o. female  with history of fibromyalgia, hypothyroidism, solitary kidney, hypothyroidism, diabetes, nonischemic cardiomyopathy with ejection fraction of 35%, atrial fibrillation who presented to the emergency department with left-sided lower leg pain. She was admitted on 9/10 w/ recurrent Lower extremity cellulitis. PCCM asked to see in consult on 9/11 for persistent hypotension and worsening renal failure.  She had a previous admission for LE cellulitis in 07/2014 followed by [facility].  Pt had returned home with HHPT prior to this admission.    OT comments  Pt with significant improvement today.  Cotreated with PT.  Pt was able to move to partial stand x 3 with mod A + 3 after max encouragement provided.  She remains very fearful of moving.   Follow Up Recommendations  SNF;Supervision/Assistance - 24 hour    Equipment Recommendations  None recommended by OT    Recommendations for Other Services      Precautions / Restrictions Precautions Precautions: Fall Restrictions Weight Bearing Restrictions: No       Mobility Bed Mobility Overal bed mobility: Needs Assistance;+2 for physical assistance Bed Mobility: Rolling Rolling: Mod assist;+2 for physical assistance;Max assist   Supine to sit: Min assist;+2 for physical assistance Sit to supine: +2 for physical assistance;Total assist   General bed mobility comments: Pt assisted a little more with rolling and getting OOB today.    Transfers Overall transfer level: Needs assistance Equipment used: Rolling walker (2 wheeled) Transfers: Sit to/from Stand Sit to Stand: Mod assist;+2 physical assistance (with third person for safety.)         General transfer comment: +3 for physical assist for sit - stand from EOB with support from RW. Pt able to stand x 3 (15 seconds, 25  seconds and 20 seconds).  Pt's LEs were extended fully with therapist having pillows in front of her knees since her knees were sore.  Pt needed cuing to stand tall and use UEs as she tends to lean over onto her forearms.  Most difficulty was talking pt into standing however once pt attempted, she was encouraged.      Balance Overall balance assessment: Needs assistance Sitting-balance support: Single extremity supported Sitting balance-Leahy Scale: Fair Sitting balance - Comments: pt able to wash and dry face seated at EOB.  Caution used as pt leans at times and monitoring for pt to not slide off bed.     Standing balance support: Bilateral upper extremity supported Standing balance-Leahy Scale: Poor Standing balance comment: Needed mod assist of 3  and RW to stand at EOB.                     ADL                           Toilet Transfer: +2 for physical assistance;Moderate assistance;Stand-pivot;RW (+3 assist sit to stand only )   Toileting- Clothing Manipulation and Hygiene: +2 for physical assistance;Sit to/from stand;Total assistance         General ADL Comments: With max encouragement, pt agreeable to attempting standing       Vision                     Perception     Praxis      Cognition   Behavior During Therapy: Anxious Overall Cognitive  Status: Within Functional Limits for tasks assessed                       Extremity/Trunk Assessment               Exercises General Exercises - Lower Extremity Ankle Circles/Pumps: AROM;Strengthening;Both;15 reps;Supine Long Arc Quad: AAROM;Both;10 reps;Seated   Shoulder Instructions       General Comments      Pertinent Vitals/ Pain       Pain Assessment: Faces Faces Pain Scale: Hurts whole lot Pain Location: bil. LEs Pain Descriptors / Indicators: Aching;Pressure Pain Intervention(s): Limited activity within patient's tolerance;Monitored during session;Repositioned  Home  Living                                          Prior Functioning/Environment              Frequency Min 2X/week     Progress Toward Goals  OT Goals(current goals can now be found in the care plan section)  Progress towards OT goals: Progressing toward goals;Goals drowngraded-see care plan  Acute Rehab OT Goals Patient Stated Goal: To get out of this bed OT Goal Formulation: With patient Time For Goal Achievement: 10/13/14 Potential to Achieve Goals: Good ADL Goals Pt Will Perform Grooming: sitting;with set-up;with supervision Pt Will Perform Upper Body Bathing: with mod assist;sitting Pt Will Perform Lower Body Bathing: with mod assist;bed level Additional ADL Goal #1: Pt will move to EOB sitting with min A in prep for BADL activity  Additional ADL Goal #2: Pt will move sit to stand with mod A +2 in prep for toilet transfers.   Plan Discharge plan remains appropriate;Other (comment)    Co-evaluation    PT/OT/SLP Co-Evaluation/Treatment: Yes Reason for Co-Treatment: For patient/therapist safety   OT goals addressed during session: Strengthening/ROM;ADL's and self-care      End of Session Equipment Utilized During Treatment: Gait belt   Activity Tolerance Patient tolerated treatment well   Patient Left in bed;with call bell/phone within reach;with family/visitor present   Nurse Communication Mobility status        Time: 2563-8937 OT Time Calculation (min): 61 min  Charges: OT General Charges $OT Visit: 1 Procedure OT Treatments $Therapeutic Activity: 23-37 mins  Tamarcus Condie M 09/29/2014, 4:34 PM

## 2014-09-29 NOTE — Progress Notes (Signed)
RN recheck patients blood pressure after receiving 250 cc NS bolus. MAP 46 and blood pressure of 99/33 in her left arm. RN made MD on call aware, per orders, and orders received from MD. Patient denies feeling "bad" or any worse than earlier today. RN will administer another 250 cc bolus as ordered and will also hold 1800 dose of IV lasix. Alfredo Bach RN BSN 09/29/2014 6:02 PM

## 2014-09-30 DIAGNOSIS — N184 Chronic kidney disease, stage 4 (severe): Secondary | ICD-10-CM

## 2014-09-30 DIAGNOSIS — I5043 Acute on chronic combined systolic (congestive) and diastolic (congestive) heart failure: Secondary | ICD-10-CM

## 2014-09-30 DIAGNOSIS — Z950 Presence of cardiac pacemaker: Secondary | ICD-10-CM

## 2014-09-30 LAB — CBC
HEMATOCRIT: 24.9 % — AB (ref 36.0–46.0)
HEMOGLOBIN: 8.2 g/dL — AB (ref 12.0–15.0)
MCH: 32.4 pg (ref 26.0–34.0)
MCHC: 32.9 g/dL (ref 30.0–36.0)
MCV: 98.4 fL (ref 78.0–100.0)
Platelets: 192 10*3/uL (ref 150–400)
RBC: 2.53 MIL/uL — AB (ref 3.87–5.11)
RDW: 18.4 % — ABNORMAL HIGH (ref 11.5–15.5)
WBC: 7.4 10*3/uL (ref 4.0–10.5)

## 2014-09-30 LAB — PROTIME-INR
INR: 1.7 — ABNORMAL HIGH (ref 0.00–1.49)
Prothrombin Time: 20 seconds — ABNORMAL HIGH (ref 11.6–15.2)

## 2014-09-30 LAB — RENAL FUNCTION PANEL
Albumin: 1.3 g/dL — ABNORMAL LOW (ref 3.5–5.2)
Anion gap: 16 — ABNORMAL HIGH (ref 5–15)
BUN: 100 mg/dL — AB (ref 6–23)
CHLORIDE: 91 meq/L — AB (ref 96–112)
CO2: 24 mEq/L (ref 19–32)
CREATININE: 2.88 mg/dL — AB (ref 0.50–1.10)
Calcium: 7.3 mg/dL — ABNORMAL LOW (ref 8.4–10.5)
GFR calc Af Amer: 19 mL/min — ABNORMAL LOW (ref >90)
GFR calc non Af Amer: 16 mL/min — ABNORMAL LOW (ref >90)
Glucose, Bld: 148 mg/dL — ABNORMAL HIGH (ref 70–99)
Phosphorus: 5.5 mg/dL — ABNORMAL HIGH (ref 2.3–4.6)
Potassium: 3.9 mEq/L (ref 3.7–5.3)
Sodium: 131 mEq/L — ABNORMAL LOW (ref 137–147)

## 2014-09-30 LAB — PRO B NATRIURETIC PEPTIDE: Pro B Natriuretic peptide (BNP): 8120 pg/mL — ABNORMAL HIGH (ref 0–125)

## 2014-09-30 LAB — GLUCOSE, CAPILLARY
GLUCOSE-CAPILLARY: 182 mg/dL — AB (ref 70–99)
GLUCOSE-CAPILLARY: 218 mg/dL — AB (ref 70–99)
Glucose-Capillary: 167 mg/dL — ABNORMAL HIGH (ref 70–99)
Glucose-Capillary: 182 mg/dL — ABNORMAL HIGH (ref 70–99)

## 2014-09-30 LAB — MAGNESIUM: Magnesium: 1.9 mg/dL (ref 1.5–2.5)

## 2014-09-30 LAB — PARATHYROID HORMONE, INTACT (NO CA): PTH: 133 pg/mL — AB (ref 14–64)

## 2014-09-30 MED ORDER — WARFARIN SODIUM 7.5 MG PO TABS
7.5000 mg | ORAL_TABLET | Freq: Once | ORAL | Status: AC
  Administered 2014-09-30: 7.5 mg via ORAL
  Filled 2014-09-30: qty 1

## 2014-09-30 NOTE — Progress Notes (Signed)
[facility] PROGRESS NOTE  [name] BPZ:[id number] DOB: August 29, 1950 DOA: 09/09/2014 PCP: Elby Showers, MD   Assessment/Plan:   Septic shock due to Left lower leg cellulitis:  - Initially on Vanc/CIpro/FLagyl from 9/11  - changed to IV cefazolin from 9/14  - blood culture 1/2 group B strep, completed 14 days of antibiotics, between Vanc and ancef  - Ancef stopped 9/24  - Wound care consult in place.  Currently recommending general surgery consult for surgical debridement.  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- EF 30-35% June 2014  -remains volume overload, anasarca, third spacing due to severe hypoalbuminemia and CKD3-4  -was being diuresed with IV lasix, changed to Po demadex and metolazone per Cards over weekend  -creatinine trending up, baseline 2, negative 18.3L, but weight still trending up despite being net negative everyday  -no ACE due to AKI  -baseline dry weight unclear around 330lbs -Renal on board amd now on High dose IV lasix per dr.Webb   Hypotension - Suspect blood pressure readings not accurate as patient has had reported systolic blood pressure is a 60 but has been able to carry a full conversation with staff. - Due to patient's body habitus I suspect patient's blood pressure recordings are inaccurate. - Cardiology and nephrology assisting with diuresis, patient refuses a line  RUE swelling  -improved  -dopplers 9/22 negative, due to third spacing   Type 2 diabetes mellitus:  - A1c 7.0  - lantus increased to 22 units.  - SSI, blood sugars relatively controlled on this regimen  . PAF (paroxysmal atrial fibrillation)  - Currently with a paced rhythm  - Coumadin dosed by pharmacy -  continue amiodarone   . Hypothyroidism, acquired, autoimmune: Recent TSH was 2.12 on 05/28/14.  - Continue levothyroxine   Gout: stable  -Continue Uloric   . AKI on CKD (chronic kidney disease) stage 4, GFR 15-29 ml/min: ccl slightly  higher from baseline 2.5-3.00 to 3.38 on admission  - Currently at baseline  -Followed by Dr.Dunham, Renal consulted given overloaded state with CKD, CHF and Hypoalbuminemia  -now on high dose of IV lasix and metolazone   . PACEMAKER, PERMANENT  . Obesity hypoventilation syndrome  - CPAP QHS   . OBESITY, MORBID  Tremors/shakes  -gabapentin stopped, monitor  -improving   Anemia:  -due to CKD and acute illness and iron deficiency -baseline around 9, transfused 2units PRBC 9/22  -Anemia panel c/w Chronic disease, transfuse if Hb <7   Code Status: full  Family Communication: patient  Disposition Plan: SNF when stable  Consultants:  PCCM  [facility] Nephrology   Procedures:  Central venous catheter insertion procedure on 09/10/2049   Code Status: full Family Communication: discussed with patient directly Disposition Plan: pending recommendations from specialist involved.   HPI/Subjective: Wound care nurse reported that patient most likely needed surgical debridement at lesions of her legs bilaterally.  Objective: Filed Vitals:   09/30/14 1405  BP: 95/47  Pulse: 75  Temp: 97.7 F (36.5 C)  Resp: 17    Intake/Output Summary (Last 24 hours) at 09/30/14 1500 Last data filed at 09/30/14 1405  Gross per 24 hour  Intake    620 ml  Output   1450 ml  Net   -830 ml   Filed Weights   09/28/14 0434 09/29/14 0609 09/30/14 0645  Weight: 160.483 kg (353 lb 12.8 oz) 160.982 kg (354 lb 14.4 oz) 188.696 kg (416 lb)    Exam:   General:  Patient in no  acute distress, alert and awake  Cardiovascular: S1 and S2 present  Respiratory: No audible wheezes, equal chest rise  Abdomen: Nontender, nondistended  Neuro: answers questions appropriately and moves extremities equally  Data Reviewed: Basic Metabolic Panel:  Recent Labs Lab 09/26/14 0523 09/27/14 0529 09/28/14 0533 09/29/14 0610 09/30/14 0520  NA 132* 130* 130* 131* 131*  K 4.3 4.2 4.0 3.8 3.9  CL 94* 93* 92*  92* 91*  CO2 25 25 24 25 24   GLUCOSE 154* 170* 200* 160* 148*  BUN 76* 86* 95* 95* 100*  CREATININE 2.63* 2.80* 2.95* 2.91* 2.88*  CALCIUM 7.9* 7.6* 7.5* 7.4* 7.3*  MG  --   --  1.9 1.9 1.9  PHOS  --   --  5.6* 5.5* 5.5*   Liver Function Tests:  Recent Labs Lab 09/28/14 0533 09/29/14 0610 09/30/14 0520  ALBUMIN 1.4* 1.4* 1.3*   No results found for this basename: LIPASE, AMYLASE,  in the last 168 hours No results found for this basename: AMMONIA,  in the last 168 hours CBC:  Recent Labs Lab 09/24/14 0645 09/27/14 0529 09/28/14 0533 09/29/14 0610 09/30/14 0520  WBC 5.6 6.8 7.2 6.6 7.4  HGB 8.5* 7.3* 7.5* 8.3* 8.2*  HCT 26.4* 22.1* 22.4* 25.3* 24.9*  MCV 97.4 96.9 95.7 99.6 98.4  PLT 266 199 192 189 192   Cardiac Enzymes: No results found for this basename: CKTOTAL, CKMB, CKMBINDEX, TROPONINI,  in the last 168 hours BNP (last 3 results)  Recent Labs  09/30/14 0520  PROBNP 8120.0*   CBG:  Recent Labs Lab 09/29/14 1155 09/29/14 1633 09/29/14 2138 09/30/14 0622 09/30/14 1105  GLUCAP 157* 166* 173* 167* 182*    No results found for this or any previous visit (from the past 240 hour(s)).   Studies: No results found.  Scheduled Meds: . amiodarone  400 mg Oral Daily  . calcitRIOL  0.25 mcg Oral BID  . calcium-vitamin D  1 tablet Oral BID  . collagenase   Topical Daily  . febuxostat  80 mg Oral Daily  . furosemide  160 mg Intravenous BID  . insulin aspart  0-15 Units Subcutaneous TID WC  . insulin aspart  0-5 Units Subcutaneous QHS  . insulin aspart  2 Units Subcutaneous TID WC  . insulin glargine  22 Units Subcutaneous QHS  . iron polysaccharides  150 mg Oral BID  . levothyroxine  125 mcg Oral QAC breakfast  . linagliptin  5 mg Oral Daily  . metolazone  2.5 mg Oral QODAY  . multivitamin with minerals  1 tablet Oral Daily  . potassium chloride  20 mEq Oral Daily  . saccharomyces boulardii  250 mg Oral BID  . sodium chloride  3 mL Intravenous Q12H   . warfarin  7.5 mg Oral ONCE-1800  . 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   Obesity hypoventilation syndrome-    Cardiomyopathy, nonischemic- EF 30-35% June 2014   Type 2 diabetes mellitus   Chronic combined systolic and diastolic CHF   Long term (current) use of anticoagulants   CKD (chronic kidney disease) stage 4, GFR 15-29 ml/min   PAF- recurrent despite Amiodarone and multiple cardioversions   Mitral insufficiency, moderate to severe   Chronic massive bilat LE lymphadema   Fall  at home- 3 falls this summer, sound orthostatic   Hypotension   Acute on chronic renal failure   Coarse tremors    Time spent: > 35 minutes    Velvet Bathe  [facility] Pager [phone] If 7PM-7AM, please contact night-coverage at [email or web address], password [facility] 09/30/2014, 3:00 PM  LOS: 21 days

## 2014-09-30 NOTE — Progress Notes (Signed)
Physical Therapy Wound Evaluation and Treatment Patient Details  Name: [name] MRN: [id number] Date of Birth: 03-May-1950  Today's Date: 09/30/2014 Time: 9144-4584 Time Calculation (min): 81 min  Subjective  Subjective: Pt reports her skin blisters with all dressings/adhesive except tegaderm (including pink foam) Patient and Family Stated Goals: decr pain in wounds and heal without surgery Date of Onset:  (multiple areas; exact dates unknown) Prior Treatments: mepitel foam, various types of tape  Pain Score: Pain Score: 9 despite pre-medication; limited treatment to pt tolerance, repositioned  Wound Assessment  Clinical Statement: Pt with multiple wounds complicated by increased moisture, maceration and denuding of surrounding skin. Pt develops blisters with any type of adhesive dressing other than transparent film. This topping however is creating incr moisture under dressing. Discussed with Dr. Janee Morn need to incr dressing changes to at least BID. Pt very painful despite premedication and tolerated limited debridement. Anticipate this will improve as enzymatic debridement and PLS soften eschar.  Pressure Ulcer 09/20/14 Deep Tissue Injury - Purple or maroon localized area of discolored intact skin or blood-filled blister due to damage of underlying soft tissue from pressure and/or shear. (Active)  Dressing Type Gauze (Comment);Moist to dry;Transparent dressing;Other (Comment) 09/29/2014  8:04 PM  Dressing Clean;Dry;Intact 09/29/2014  8:04 PM  State of Healing Non-healing 09/29/2014  8:04 PM  % Wound base Red or Granulating 90% 09/29/2014  8:04 PM  % Wound base Yellow 10% 09/29/2014  8:04 PM  Margins Unattached edges (unapproximated) 09/29/2014  8:04 PM  Drainage Amount Scant 09/29/2014  8:04 PM  Drainage Description Serosanguineous 09/29/2014  8:04 PM  Treatment Cleansed;Other (Comment) 09/29/2014  8:04 PM     Pressure Ulcer 09/20/14 Deep Tissue Injury - Purple or maroon localized area  of discolored intact skin or blood-filled blister due to damage of underlying soft tissue from pressure and/or shear. (Active)  Dressing Type Barrier Film (skin prep);Gauze (Comment);Transparent dressing 09/30/2014 10:24 AM  Dressing Changed;Clean;Dry;Intact 09/30/2014 10:24 AM  Dressing Change Frequency Daily 09/30/2014 10:24 AM  State of Healing Eschar 09/30/2014 10:24 AM  Site / Wound Assessment Granulation tissue;Yellow;Painful 09/30/2014 10:24 AM  % Wound base Red or Granulating 5% 09/30/2014 10:24 AM  % Wound base Yellow 95% 09/30/2014 10:24 AM  % Wound base Black 0% 09/30/2014 10:24 AM  % Wound base Other (Comment) 0% 09/30/2014 10:24 AM  Peri-wound Assessment Denuded;Maceration;Pink 09/30/2014 10:24 AM  Wound Length (cm) 3.8 cm 09/30/2014 10:24 AM  Wound Width (cm) 5.7 cm 09/30/2014 10:24 AM  Margins Unattached edges (unapproximated) 09/30/2014 10:24 AM  Drainage Amount Moderate 09/30/2014 10:24 AM  Drainage Description Serous 09/30/2014 10:24 AM  Treatment Debridement (Selective);Hydrotherapy (Pulse lavage) 09/30/2014 10:24 AM        Wound / Incision (Open or Dehisced) 09/10/14 Other (Comment) Thigh Medial;Left;Posterior;Proximal (Active)  Dressing Type Gauze (Comment);Barrier Film (skin prep);Transparent dressing 09/30/2014 10:24 AM  Dressing Changed Changed 09/30/2014 10:24 AM  Dressing Status Clean;Dry;Intact 09/30/2014 10:24 AM  Dressing Change Frequency Daily 09/30/2014 10:24 AM  Site / Wound Assessment Black;Granulation tissue;Painful;Pink;Yellow 09/30/2014 10:24 AM  % Wound base Red or Granulating 10% 09/30/2014 10:24 AM  % Wound base Yellow 75% 09/30/2014 10:24 AM  % Wound base Black 15% 09/30/2014 10:24 AM  Peri-wound Assessment Denuded;Edema;Maceration;Pink 09/30/2014 10:24 AM  Wound Length (cm) 9.1 cm 09/30/2014 10:24 AM  Wound Width (cm) 7.3 cm 09/30/2014 10:24 AM  Margins Unattached edges (unapproximated) 09/30/2014 10:24 AM  Closure None 09/30/2014 10:24 AM  Drainage Amount Copious  09/30/2014 10:24 AM  Drainage Description Serous  09/30/2014 10:24 AM  Non-staged Wound Description Full thickness 09/30/2014 10:24 AM  Treatment Debridement (Selective);Hydrotherapy (Pulse lavage) 09/30/2014 10:24 AM     Wound / Incision (Open or Dehisced) 09/18/14 Other (Comment) Hip Right Black eschar tissue surrounded by pink tissue (Active)  Dressing Type Gauze (Comment);Transparent dressing;Other (Comment);Barrier Film (skin prep) 09/30/2014 10:24 AM  Dressing Changed Changed 09/30/2014 10:24 AM  Dressing Status Clean;Dry;Intact 09/30/2014 10:24 AM  Dressing Change Frequency Daily 09/30/2014 10:24 AM  Site / Wound Assessment Black;Brown;Granulation tissue;Painful;Pink;Yellow 09/30/2014 10:24 AM  % Wound base Red or Granulating 0% 09/30/2014 10:24 AM  % Wound base Yellow 60% 09/30/2014 10:24 AM  % Wound base Black 40% 09/30/2014 10:24 AM  % Wound base Other (Comment) 0% 09/30/2014 10:24 AM  Peri-wound Assessment Denuded;Edema;Maceration;Pink 09/30/2014 10:24 AM  Wound Length (cm) 9 cm 09/30/2014 10:24 AM  Wound Width (cm) 8.8 cm 09/30/2014 10:24 AM  Margins Unattached edges (unapproximated) 09/30/2014 10:24 AM  Closure None 09/30/2014 10:24 AM  Drainage Amount Copious 09/30/2014 10:24 AM  Drainage Description Serous 09/30/2014 10:24 AM  Non-staged Wound Description Full thickness 09/30/2014 10:24 AM  Treatment Debridement (Selective);Hydrotherapy (Pulse lavage) 09/30/2014 10:24 AM     Incision 05/05/12 Perineum Other (Comment) (Active)   Hydrotherapy Pulsed lavage therapy - wound location: Rt lateral hip, Lt lateral thigh, Lt proximal-posteriomedial thigh Pulsed Lavage with Suction (psi): 4 psi Pulsed Lavage with Suction - Normal Saline Used: Other (comment) (1500 ml tolerated) Pulsed Lavage Tip: Tip with splash shield Selective Debridement Selective Debridement - Location: Rt lateral hip, Lt lateral thigh, Lt proximal-posteriomedial thigh Selective Debridement - Tools Used:  Forceps;Scalpel;Scissors Selective Debridement - Tissue Removed: yellow eschar; scoring eschar with scalpel   Wound Assessment and Plan  Wound Therapy - Assess/Plan/Recommendations Wound Therapy - Clinical Statement: Pt with multiple wounds complicated by increased moisture, maceration and denuding of surrounding skin. Pt develops blisters with any type of adhesive dressing other than transparent film. This topping however is creating incr moisture under dressing. Discussed with Dr. Grandville Silos need to incr dressing changes to at least BID. Pt very painful despite premedication and tolerated limited debridement. Anticipate this will improve as enzymatic debridement and PLS soften eschar. Wound Therapy - Functional Problem List: painful wounds limiting her mobility tolerance Factors Delaying/Impairing Wound Healing: Diabetes Mellitus;Infection - systemic/local;Immobility;Multiple medical problems;Vascular compromise Hydrotherapy Plan: Debridement;Dressing change;Patient/family education;Pulsatile lavage with suction Wound Therapy - Frequency: 6X / week Wound Therapy - Current Recommendations: WOC nurse Wound Therapy - Follow Up Recommendations: Skilled nursing facility Wound Plan: see above  Wound Therapy Goals- Improve the function of patient's integumentary system by progressing the wound(s) through the phases of wound healing (inflammation - proliferation - remodeling) by: Decrease Necrotic Tissue to: <75% Decrease Necrotic Tissue - Progress: Goal set today Increase Granulation Tissue to: >25% Increase Granulation Tissue - Progress: Goal set today Improve Drainage Characteristics: Min Improve Drainage Characteristics - Progress: Goal set today Goals/treatment plan/discharge plan were made with and agreed upon by patient/family: Yes Time For Goal Achievement: 7 days Wound Therapy - Potential for Goals: Good  Goals will be updated until maximal potential achieved or discharge criteria met.   Discharge criteria: when goals achieved, discharge from hospital, MD decision/surgical intervention, no progress towards goals, refusal/missing three consecutive treatments without notification or medical reason.  GP     Suezette Lafave 09/30/2014, 10:52 AM Pager [phone]

## 2014-09-30 NOTE — Progress Notes (Signed)
D/W PT. Plan change to BID dressing changes as skin gets macerated under tegaderm (the only adhesive she can have). Georganna Skeans, MD, MPH, FACS Trauma: [phone] General Surgery: [phone]

## 2014-09-30 NOTE — Progress Notes (Signed)
Patient Profile: [name] is an 64 y.o. 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   We were consulted for in regards to management of volume overload in the setting of chronic CHF and CKD Stage IV with hypoalbuminemia .  Subjective: No major complaints. Has occasional dizziness when she sits up but symptoms don't last long.   Objective: Vital signs in last 24 hours: Temp:  [97.7 F (36.5 C)-98.2 F (36.8 C)] 97.9 F (36.6 C) (10/01 0544) Pulse Rate:  [75-93] 93 (10/01 0544) Resp:  [17-18] 18 (10/01 0544) BP: (60-99)/(33-44) 85/44 mmHg (10/01 0544) SpO2:  [95 %-97 %] 95 % (10/01 0544) Weight:  [416 lb (188.696 kg)] 416 lb (188.696 kg) (10/01 0645) Last BM Date: 09/29/14  Intake/Output from previous day: 09/30 0701 - 10/01 0700 In: 740 [P.O.:720; I.V.:20] Out: 1751 [Urine:1750; Stool:1] Intake/Output this shift:    Medications Current Facility-Administered Medications  Medication Dose Route Frequency Provider Last Rate Last Dose  . albuterol (PROVENTIL) (2.5 MG/3ML) 0.083% nebulizer solution 2.5 mg  2.5 mg Nebulization Q6H PRN Ivor Costa, MD      . ALPRAZolam Duanne Moron) tablet 0.25 mg  0.25 mg Oral BID PRN Hosie Poisson, MD   0.25 mg at 09/29/14 1608  . amiodarone (PACERONE) tablet 400 mg  400 mg Oral Daily Leonie Man, MD   400 mg at 09/29/14 1006  . calcitRIOL (ROCALTROL) capsule 0.25 mcg  0.25 mcg Oral BID Ivor Costa, MD   0.25 mcg at 09/29/14 2247  . calcium carbonate (TUMS - dosed in mg elemental calcium) chewable tablet 200 mg of elemental calcium  1 tablet Oral BID PRN Geradine Girt, DO   200 mg of elemental calcium at 09/14/14 1603  . calcium-vitamin D  (OSCAL WITH D) 500-200 MG-UNIT per tablet 1 tablet  1 tablet Oral BID Ivor Costa, MD   1 tablet at 09/23/14 2314  . collagenase (SANTYL) ointment   Topical Daily Velvet Bathe, MD      . febuxostat (ULORIC) tablet 80 mg  80 mg Oral Daily Ivor Costa, MD   80 mg at 09/29/14 1007  . furosemide (LASIX) 160 mg in dextrose 5 % 50 mL IVPB  160 mg Intravenous BID Juluis Mire, MD   160 mg at 09/30/14 0824  . HYDROcodone-acetaminophen (NORCO) 10-325 MG per tablet 1 tablet  1 tablet Oral Q4H PRN Hosie Poisson, MD   1 tablet at 09/30/14 0833  . hydrOXYzine (ATARAX/VISTARIL) tablet 25 mg  25 mg Oral Q4H PRN Ivor Costa, MD   25 mg at 09/18/14 2219  . insulin aspart (novoLOG) injection 0-15 Units  0-15 Units Subcutaneous TID WC Geradine Girt, DO   3 Units at 09/30/14 [phone]  . insulin aspart (novoLOG) injection 0-5 Units  0-5 Units Subcutaneous QHS Geradine Girt, DO   2 Units at 09/28/14 2236  . insulin aspart (novoLOG) injection 2 Units  2 Units Subcutaneous TID WC Hosie Poisson, MD   2 Units at 09/30/14 0644  . insulin glargine (LANTUS) injection 22 Units  22 Units Subcutaneous QHS Hosie Poisson, MD   22 Units at 09/29/14 2248  . iron polysaccharides (NIFEREX) capsule 150 mg  150 mg Oral BID Ivor Costa, MD   150  mg at 09/29/14 2247  . levothyroxine (SYNTHROID, LEVOTHROID) tablet 125 mcg  125 mcg Oral QAC breakfast Ivor Costa, MD   125 mcg at 09/30/14 [phone]  . linagliptin (TRADJENTA) tablet 5 mg  5 mg Oral Daily Wilhelmina Mcardle, MD   5 mg at 09/29/14 1007  . metolazone (ZAROXOLYN) tablet 2.5 mg  2.5 mg Oral QODAY Leonie Man, MD   2.5 mg at 09/30/14 0825  . multivitamin with minerals tablet 1 tablet  1 tablet Oral Daily Ivor Costa, MD   1 tablet at 09/28/14 1136  . nitroGLYCERIN (NITROSTAT) SL tablet 0.4 mg  0.4 mg Sublingual Q5 min PRN Velvet Bathe, MD      . potassium chloride (K-DUR) CR tablet 20 mEq  20 mEq Oral Daily Rhonda G Barrett, PA-C   20 mEq at 09/29/14 1006  . saccharomyces boulardii (FLORASTOR) capsule  250 mg  250 mg Oral BID Ivor Costa, MD   250 mg at 09/29/14 2247  . sodium chloride 0.9 % bolus 1,000 mL  1,000 mL Intravenous PRN Erick Colace, NP      . sodium chloride 0.9 % bolus 1,000 mL  1,000 mL Intravenous PRN Erick Colace, NP      . sodium chloride 0.9 % injection 10-40 mL  10-40 mL Intracatheter PRN Raylene Miyamoto, MD   30 mL at 09/30/14 0522  . sodium chloride 0.9 % injection 3 mL  3 mL Intravenous Q12H Ivor Costa, MD   3 mL at 09/29/14 1018  . Warfarin - Pharmacist Dosing Inpatient   Does not apply q1800 Saundra Shelling, [facility]        PE: General appearance: alert, cooperative, no distress and morbidly obese Lungs: clear to auscultation bilaterally Heart: irregularly irregular rhythm Extremities: 3+ bilateral LEE Pulses: 2+ and symmetric Skin: warm and dry Neurologic: Grossly normal  Lab Results:   Recent Labs  09/28/14 0533 09/29/14 0610 09/30/14 0520  WBC 7.2 6.6 7.4  HGB 7.5* 8.3* 8.2*  HCT 22.4* 25.3* 24.9*  PLT 192 189 192   BMET  Recent Labs  09/28/14 0533 09/29/14 0610 09/30/14 0520  NA 130* 131* 131*  K 4.0 3.8 3.9  CL 92* 92* 91*  CO2 24 25 24   GLUCOSE 200* 160* 148*  BUN 95* 95* 100*  CREATININE 2.95* 2.91* 2.88*  CALCIUM 7.5* 7.4* 7.3*   PT/INR  Recent Labs  09/28/14 0533 09/29/14 0610 09/30/14 0520  LABPROT 18.7* 19.1* 20.0*  INR 1.56* 1.60* 1.70*   Assessment/Plan   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 Acute on Chronic Systolic HF: Nephrology assisting. Now on high dose IV Lasix, 160 mg BID and 2.5 mg of metolazone. She diuresed an additional 1.8 L in past 24 hrs. -19.9L total since admit. Continue diuresis. Low  sodium diet.   2. Persistent A-fib: rate is controlled. Continue amiodarone. Continue warfarin.   3. Anticoagulation: INR is subtherapeutic at 1.70. Being followed by pharmacy. She needs to be therapeutic not only for stroke prophylaxis but also for DVT prophylaxis as she has been very sedentary this admission.   4. CKD: nephrology following. SCr slightly improved since yesterday at 2.88, down from 2.95 and 2.91.  5. Hypotension: Patient had SBP readings in the 60s overnight. Im doubtful that readings have been accurate. Pt seems fairly asymptomatic with the exception of mild dizziness when she changes positions. Mild symptoms do not seem to correlate with degree of reported BP measurements. However, patient states that RN will hold lasix if BP reading is too low. ? A better way to accurately assess BP. She refuses an arterial line.    LOS: 21 days    Pecola Haxton M. Hargreaves, PA-C 09/30/2014 9:38 AM

## 2014-09-30 NOTE — Clinical Social Work Note (Signed)
SNF bed offers are still slow to come in- patient and sister [name] are aware and are still hopeful for some options-  CSW discussed concern that patient will become medically cleared and not have offers from her preferences- she has declined all current SNF offers- they are considering home if they have to..... Sister plans to visit a few SNF's to inquire about possible offers. Patient in goo spirits and motivated- she is hopeful and appreciative of care.  Eduard Clos, MSW, [place]

## 2014-09-30 NOTE — Progress Notes (Signed)
Patient ID: [name], female   DOB: March 23, 1950, 64 y.o.   MRN: [id number] Patient should be starting with hydrotherapy today.  We will evaluate the patient's wounds tomorrow before the weekend after starting treatment today.  [name] E 8:17 AM 09/30/2014

## 2014-09-30 NOTE — Progress Notes (Addendum)
Physical Therapy Treatment Patient Details Name: [name] MRN: [id number] DOB: [birth year] Today's Date: 09/30/2014    History of Present Illness 64 y.o. female  with history of fibromyalgia, hypothyroidism, solitary kidney, hypothyroidism, diabetes, nonischemic cardiomyopathy with ejection fraction of 35%, atrial fibrillation who presented to the emergency department with left-sided lower leg pain. She was admitted on 9/10 w/ recurrent Lower extremity cellulitis. PCCM asked to see in consult on 9/11 for persistent hypotension and worsening renal failure.  She had a previous admission for LE cellulitis in 07/2014 followed by [facility].  Pt had returned home with HHPT prior to this admission.     PT Comments    Pt admitted with above. Pt currently with functional limitations due to weakness and endurance deficits. Met no goals of 2 weeks due to medical complications.  Goals updated.   Pt will benefit from skilled PT to increase their independence and safety with mobility to allow discharge to the venue listed below.   Follow Up Recommendations  SNF;Supervision/Assistance - 24 hour     Equipment Recommendations  None recommended by PT    Recommendations for Other Services       Precautions / Restrictions Precautions Precautions: Fall Restrictions Weight Bearing Restrictions: No    Mobility  Bed Mobility Overal bed mobility: Needs Assistance;+2 for physical assistance Bed Mobility: Rolling Rolling: Mod assist;+2 for physical assistance;Max assist         General bed mobility comments: Pt able to pull herself up in bed with bed in trendelenberg.    Transfers                    Ambulation/Gait                 Stairs            Wheelchair Mobility    Modified Rankin (Stroke Patients Only)       Balance                                    Cognition Arousal/Alertness: Awake/alert Behavior During Therapy: Anxious Overall  Cognitive Status: Within Functional Limits for tasks assessed                      Exercises General Exercises - Upper Extremity Shoulder Flexion: AROM;Strengthening;Both;10 reps;Supine;Bar weights/barbell Bar Weights/Barbell (Shoulder Flexion): 1 lb Shoulder ABduction: AROM;Both;Strengthening;10 reps;Supine;Bar weights/barbell Bar Weights/Barbell (Shoulder Abduction): 1 lb Shoulder Horizontal ABduction: AROM;Strengthening;Both;10 reps;Supine;Bar weights/barbell Bar Weights/Barbell (Shoulder Horizontal Abduction): 1 lb Elbow Flexion: AROM;Both;10 reps;Supine;Bar weights/barbell Bar Weights/Barbell (Elbow Flexion): 1 lb Elbow Extension: AROM;Both;10 reps;Supine;Bar weights/barbell Bar Weights/Barbell (Elbow Extension): 1 lb General Exercises - Lower Extremity Ankle Circles/Pumps: AROM;Strengthening;Both;15 reps;Supine Quad Sets: AROM;Both;10 reps;Supine Gluteal Sets: AROM;Both;10 reps;Supine Heel Slides: Both;10 reps;Supine;AAROM Hip ABduction/ADduction: AAROM;Both;10 reps;Supine Straight Leg Raises: AAROM;Both;5 reps;Supine    General Comments General comments (skin integrity, edema, etc.): Bed in rotation position      Pertinent Vitals/Pain Pain Assessment: Faces Faces Pain Scale: Hurts even more Pain Location: bil LEs Pain Descriptors / Indicators: Aching;Pressure Pain Intervention(s): Limited activity within patient's tolerance;Monitored during session;Premedicated before session;Repositioned VSS    Home Living                      Prior Function            PT Goals (current goals can now be found in the care plan  section) Progress towards PT goals: Not Progressing toward goals.  Goals updated.    Frequency  Min 2X/week    PT Plan Current plan remains appropriate    Co-evaluation             End of Session   Activity Tolerance: Patient limited by fatigue;Patient limited by pain Patient left: in bed;with call bell/phone within reach;with  family/visitor present     Time: 1210-1236 PT Time Calculation (min): 26 min  Charges:  $Therapeutic Exercise: 23-37 mins                    G CodesDenice Paradise 10-14-14, 2:34 PM [facility] [facility] [phone] (pager)

## 2014-09-30 NOTE — Progress Notes (Signed)
ANTICOAGULATION CONSULT NOTE - Follow Up Consult  Pharmacy Consult:  Coumadin Indication: atrial fibrillation  Allergies  Allergen Reactions  . Ivp Dye [Iodinated Diagnostic Agents] Shortness Of Breath    Turn red, can't breathe  . Betadine [Povidone Iodine]   . Diphenhydramine Hcl Swelling    In hands and eyes  . Fish Allergy Nausea And Vomiting  . Iohexol      Desc: PT TURNS RED AND WHEEZING   . Penicillins Other (See Comments)    Resp arrest as child. Tolerates cephalosporins.  . Povidone-Iodine Other (See Comments)    Wheezing, turn red, can't breath, and blisters  . Promethazine Hcl Other (See Comments)    Low Blood Pressure  . Tape     Blisters, can use paper tape for short periods  . Clindamycin Hives and Rash    wheezing  . Iodine Rash  . Morphine Sulfate Nausea And Vomiting and Rash  . Primaxin [Imipenem] Rash    Patient Measurements: Height: 5' 6.93" (170 cm) Weight: 416 lb (188.696 kg) (Weight obtained on new bariatric air bed.) IBW/kg (Calculated) : 61.44  Vital Signs: Temp: 97.9 F (36.6 C) (10/01 0544) Temp src: Oral (10/01 0544) BP: 85/44 mmHg (10/01 0544) Pulse Rate: 93 (10/01 0544)  Labs:  Recent Labs  09/28/14 0533 09/29/14 0610 09/30/14 0520  HGB 7.5* 8.3* 8.2*  HCT 22.4* 25.3* 24.9*  PLT 192 189 192  LABPROT 18.7* 19.1* 20.0*  INR 1.56* 1.60* 1.70*  CREATININE 2.95* 2.91* 2.88*    Estimated Creatinine Clearance: 34.1 ml/min (by C-G formula based on Cr of 2.88).  Assessment: 50 YOF continues on Coumadin from PTA for history of Afib. Home regimen: 5 mg TTSS, 2.5 mg MWF.  INR supra-therapeutic (3.4) on admit, then peaked at 5 likely due to drug-drug interactions with Cipro and Flagyl.  She then received Vitamin K 1mg  IV x 1 on 09/12/14.   She required lower doses than her home regimen during this admission d/t high INRs. As a result of this, her INR dropped to below goal. Has now had Coumadin 5 mg daily x 2 days, then 7.5 mg x 2 days. INR  about the same at 1.70.  Has active order for Niferex BID, but has often been refusing doses, but has now taken for the last few days. Also refusing Oscal D and MVI.  Goal of Therapy:  INR 2-3 Monitor platelets by anticoagulation protocol: Yes  Plan:   Coumadin 7.5 mg x 1 again today.  Continue daily PT/INR.  FYI:  Patient reports that she can only take brand-name Coumadin, which is what we stock.  Hx erratic INRs and bleeding several years ago, when she tried generic warfarin.  Uvaldo Rising, BCPS  Clinical Pharmacist Pager [phone]  09/30/2014 10:41 AM

## 2014-09-30 NOTE — Progress Notes (Addendum)
Diuresis may have to be decreased with pressure recordings as they are. However renal function unchanged. Will follow and continue current course with diuresis directed by Nephrology.

## 2014-09-30 NOTE — Progress Notes (Signed)
Shift event: RN has paged several times with hypotension. Pt with anasarca and is generally very edematous and BP difficult to assess, even manually. Per pt, usually runs 90s at home. Last BP 60/42 manually. NP to see pt.  Pt says she feels fine except for being tired. No dizziness or feeling faint. No change in mental status. Has run low BP since admission and says this is pretty normal for her. Given, she is stable and feeling fine, will continue to monitor for now.  Clance Boll, NP [facility]

## 2014-09-30 NOTE — Progress Notes (Signed)
Pt refuses CPAP  RT to monitor and assess as needed.  

## 2014-10-01 DIAGNOSIS — I481 Persistent atrial fibrillation: Secondary | ICD-10-CM

## 2014-10-01 LAB — RENAL FUNCTION PANEL
ALBUMIN: 1.3 g/dL — AB (ref 3.5–5.2)
Anion gap: 14 (ref 5–15)
BUN: 102 mg/dL — ABNORMAL HIGH (ref 6–23)
CHLORIDE: 91 meq/L — AB (ref 96–112)
CO2: 25 mEq/L (ref 19–32)
Calcium: 7 mg/dL — ABNORMAL LOW (ref 8.4–10.5)
Creatinine, Ser: 2.77 mg/dL — ABNORMAL HIGH (ref 0.50–1.10)
GFR, EST AFRICAN AMERICAN: 19 mL/min — AB (ref >90)
GFR, EST NON AFRICAN AMERICAN: 17 mL/min — AB (ref >90)
Glucose, Bld: 165 mg/dL — ABNORMAL HIGH (ref 70–99)
PHOSPHORUS: 5.1 mg/dL — AB (ref 2.3–4.6)
POTASSIUM: 3.8 meq/L (ref 3.7–5.3)
SODIUM: 130 meq/L — AB (ref 137–147)

## 2014-10-01 LAB — GLUCOSE, CAPILLARY
GLUCOSE-CAPILLARY: 204 mg/dL — AB (ref 70–99)
Glucose-Capillary: 137 mg/dL — ABNORMAL HIGH (ref 70–99)
Glucose-Capillary: 170 mg/dL — ABNORMAL HIGH (ref 70–99)
Glucose-Capillary: 184 mg/dL — ABNORMAL HIGH (ref 70–99)

## 2014-10-01 LAB — PROTIME-INR
INR: 1.83 — AB (ref 0.00–1.49)
Prothrombin Time: 21.2 seconds — ABNORMAL HIGH (ref 11.6–15.2)

## 2014-10-01 LAB — MAGNESIUM: MAGNESIUM: 1.9 mg/dL (ref 1.5–2.5)

## 2014-10-01 MED ORDER — WARFARIN SODIUM 7.5 MG PO TABS
7.5000 mg | ORAL_TABLET | Freq: Once | ORAL | Status: AC
  Administered 2014-10-01: 7.5 mg via ORAL
  Filled 2014-10-01: qty 1

## 2014-10-01 NOTE — Progress Notes (Addendum)
NUTRITION CONSULT/FOLLOW UP  INTERVENTION:  Continue snacks TID  RD to follow for nutrition care plan  NUTRITION DIAGNOSIS: Increased nutrient needs related to wound healing as evidenced by estimated nutrition needs, ongoing  Goal: Pt to meet >/= 90% of their estimated nutrition needs, progressing  Monitor:  PO intake, weight, labs, I/O's  ASSESSMENT: 64 y.o. Female with history of fibromyalgia, hypothyroidism, solitary kidney, hypothyroidism, diabetes, nonischemic cardiomyopathy with ejection fraction of 35%, atrial fibrillation who presented to ED with left-sided lower leg pain.  Patient's reports her appetite is better.  PO intake now mostly 75-100% per flowsheet records.  Increased nutrient needs ongoing given wound presence.  RD ordered snacks TID for patient 9/25, however, she reports has not been receiving.  RD to follow up with kitchen/Nutritional Services.  CWOCN note reviewed 9/30.  Noted left thigh, left posterior thigh and right hip wounds have evolved into unstageable wounds.  Height: Ht Readings from Last 1 Encounters:  09/09/14 5' 6.93" (1.7 m)    Weight: Wt Readings from Last 1 Encounters:  10/01/14 301 lb (136.533 kg)    BMI:  Body mass index is 47.24 kg/(m^2).  Estimated Nutritional Needs: Kcal: 1700-1900 Protein: 100-110 gm Fluid: 1.7-1.9 L  Skin:  Left thigh unstageable wound Left posterior thigh unstageable wound Right hip unstageable wound Left outer calf with partial thickness stasis ulcers Stage II left buttock wound  Diet Order: Heart Healthy/Carbohydrate Modified    Intake/Output Summary (Last 24 hours) at 10/01/14 1035 Last data filed at 10/01/14 0557  Gross per 24 hour  Intake    840 ml  Output   1550 ml  Net   -710 ml   Labs:   Recent Labs Lab 09/29/14 0610 09/30/14 0520 10/01/14 0608  NA 131* 131* 130*  K 3.8 3.9 3.8  CL 92* 91* 91*  CO2 25 24 25   BUN 95* 100* 102*  CREATININE 2.91* 2.88* 2.77*  CALCIUM 7.4* 7.3*  7.0*  MG 1.9 1.9 1.9  PHOS 5.5* 5.5* 5.1*  GLUCOSE 160* 148* 165*    CBG (last 3)   Recent Labs  09/30/14 1641 09/30/14 2052 10/01/14 0616  GLUCAP 182* 218* 204*    Scheduled Meds: . amiodarone  400 mg Oral Daily  . calcitRIOL  0.25 mcg Oral BID  . calcium-vitamin D  1 tablet Oral BID  . collagenase   Topical Daily  . febuxostat  80 mg Oral Daily  . furosemide  160 mg Intravenous BID  . insulin aspart  0-15 Units Subcutaneous TID WC  . insulin aspart  0-5 Units Subcutaneous QHS  . insulin aspart  2 Units Subcutaneous TID WC  . insulin glargine  22 Units Subcutaneous QHS  . iron polysaccharides  150 mg Oral BID  . levothyroxine  125 mcg Oral QAC breakfast  . linagliptin  5 mg Oral Daily  . metolazone  2.5 mg Oral QODAY  . multivitamin with minerals  1 tablet Oral Daily  . potassium chloride  20 mEq Oral Daily  . saccharomyces boulardii  250 mg Oral BID  . sodium chloride  3 mL Intravenous Q12H  . warfarin  7.5 mg Oral ONCE-1800  . Warfarin - Pharmacist Dosing Inpatient   Does not apply q1800    Continuous Infusions:   Past Medical History  Diagnosis Date  . Personal history of other diseases of circulatory system   . 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 Chronic lymphocytic thyroiditis   . Morbid obesity   .  Other chronic pulmonary heart diseases   . Obstructive sleep apnea (adult) (pediatric)   . Other and unspecified hyperlipidemia   . Fibromyalgia   . Goiter   . Thyroiditis, autoimmune   . Hypothyroidism, acquired, autoimmune   . Solitary kidney, acquired   . Fatigue   . Hyperparathyroidism, secondary   . Vitamin D deficiency disease   . Hyperparathyroidism   . H/O varicose veins 03/27/12  . Type II or unspecified type diabetes mellitus without mention of complication, not stated as uncontrolled   . Diabetes mellitus type II   . Infections of kidney 03/27/12  . History of measles, mumps, or rubella 03/27/12  . Tubal pregnancy 12/23/76   . Unspecified essential hypertension     20 yrs ago  . Blood transfusion 1977    [facility]  . Complication of anesthesia 2010    hard to wake up after knee surgery  . Anemia   . Cardiomyopathy, nonischemic     EF 45 %  . Endometrial hyperplasia with atypia 05/05/12  . Diastolic HF (heart failure) 11/05/2012  . A-fib   . Echocardiogram abnormal 07/15/12    severe MR,mod to severe TR,mod to severe PA hypertension  . Pulmonary arterial hypertension   . PAF (paroxysmal atrial fibrillation), 1st episode since DCCV in Jan/14 03/19/2013  . Ulcers of both lower extremities, small, now with cellulitis 06/05/2013    Past Surgical History  Procedure Laterality Date  . Cholecystectomy    . Pacemaker placement  02/26/11    Medtronic Adapta  . Knee surgery    . Resection duplicate left kidney    . 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 Hernia repair  1999    ruptured ;  . D& c with hysteroscopy & cervical polypectomy  05/05/12  . Cardioversion N/A 02/06/2013    Successful  . Iud removal  01/30/13  . Ectopic pregnancy surgery  1978  . Cardioversion N/A 06/08/2013    Procedure: CARDIOVERSION;  Surgeon: Sanda Klein, MD;  Location: [place];  Service: Cardiovascular;  Laterality: N/A;  Room [phone]  . Cardioversion N/A 02/01/2014    Procedure: CARDIOVERSION;  Surgeon: Sanda Klein, MD;  Location: [place];  Service: Cardiovascular;  Laterality: N/A;  . Cardioversion N/A 06/02/2014    Procedure: CARDIOVERSION;  Surgeon: Sanda Klein, MD;  Location: [place];  Service: Cardiovascular;  Laterality: N/A;    Arthur Holms, RD, LDN Pager #: [phone] After-Hours Pager #: [phone]

## 2014-10-01 NOTE — Progress Notes (Signed)
[facility] PROGRESS NOTE  [name] TML:[id number] DOB: 06-15-1950 DOA: 09/09/2014 PCP: Elby Showers, MD   Assessment/Plan:   Septic shock due to Left lower leg cellulitis:  - Initially on Vanc/CIpro/FLagyl from 9/11  - changed to IV cefazolin from 9/14  - blood culture 1/2 group B strep, completed 14 days of antibiotics, between Vanc and ancef  - Ancef stopped 9/24  - Wound care consult placed.  General surgery on board and assisting with case.  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- EF 30-35% June 2014  -remains volume overload, anasarca, third spacing due to severe hypoalbuminemia and CKD 3-4  -was being diuresed with IV lasix, changed to Po demadex and metolazone per Cards over weekend  -creatinine trending up, baseline 2, negative 18.3L, but weight still trending up despite being net negative everyday  -no ACE due to AKI  -baseline dry weight unclear around 330lbs -Renal on board and now managing IV lasix dose   Hypotension - Suspect blood pressure readings not accurate as patient has had reported systolic blood pressure is a 60 but has been able to carry a full conversation with staff. - Due to patient's body habitus I suspect patient's blood pressure recordings are inaccurate. - Cardiology and nephrology assisting with diuresis, patient refuses a line  RUE swelling  -improved  -dopplers 9/22 negative, due to third spacing   Type 2 diabetes mellitus:  - A1c 7.0  - lantus increased to 22 units.  - SSI, blood sugars relatively controlled on this regimen  . PAF (paroxysmal atrial fibrillation)  - Currently with a paced rhythm  - Coumadin dosed by pharmacy -  continue amiodarone   . Hypothyroidism, acquired, autoimmune: Recent TSH was 2.12 on 05/28/14.  - Continue levothyroxine   Gout: stable  -Continue Uloric   . AKI on CKD (chronic kidney disease) stage 4, GFR 15-29 ml/min: ccl slightly higher from baseline 2.5-3.00 to 3.38  on admission  - Currently at baseline  -Followed by Dr.Dunham, Renal consulted given overloaded state with CKD, CHF and Hypoalbuminemia  -now on high dose of IV lasix and metolazone   . PACEMAKER, PERMANENT  . Obesity hypoventilation syndrome  - CPAP QHS   . OBESITY, MORBID  Tremors/shakes  -gabapentin stopped, monitor  -improving   Anemia:  -due to CKD and acute illness and iron deficiency -baseline around 9, transfused 2units PRBC 9/22  -Anemia panel c/w Chronic disease, transfuse if Hb <7   Code Status: full  Family Communication: patient  Disposition Plan: SNF when stable  Consultants:  PCCM  Cards Nephrology   Procedures:  Central venous catheter insertion procedure on 09/10/2049   Code Status: full Family Communication: discussed with patient directly Disposition Plan: pending recommendations from specialist involved.   HPI/Subjective: Wound care nurse reported that patient most likely needed surgical debridement at lesions of her legs bilaterally.  Objective: Filed Vitals:   10/01/14 0526  BP: 105/36  Pulse: 99  Temp: 98.4 F (36.9 C)  Resp: 18    Intake/Output Summary (Last 24 hours) at 10/01/14 1442 Last data filed at 10/01/14 1441  Gross per 24 hour  Intake   1080 ml  Output   1401 ml  Net   -321 ml   Filed Weights   09/29/14 0609 09/30/14 0645 10/01/14 0526  Weight: 160.982 kg (354 lb 14.4 oz) 188.696 kg (416 lb) 136.533 kg (301 lb)    Exam:   General:  Patient in no acute distress, alert and awake  Cardiovascular: S1 and S2 present  Respiratory: No audible wheezes, equal chest rise  Abdomen: Nontender, nondistended  Neuro: answers questions appropriately and moves extremities equally  Data Reviewed: Basic Metabolic Panel:  Recent Labs Lab 09/27/14 0529 09/28/14 0533 09/29/14 0610 09/30/14 0520 10/01/14 0608  NA 130* 130* 131* 131* 130*  K 4.2 4.0 3.8 3.9 3.8  CL 93* 92* 92* 91* 91*  CO2 25 24 25 24 25   GLUCOSE 170*  200* 160* 148* 165*  BUN 86* 95* 95* 100* 102*  CREATININE 2.80* 2.95* 2.91* 2.88* 2.77*  CALCIUM 7.6* 7.5* 7.4* 7.3* 7.0*  MG  --  1.9 1.9 1.9 1.9  PHOS  --  5.6* 5.5* 5.5* 5.1*   Liver Function Tests:  Recent Labs Lab 09/28/14 0533 09/29/14 0610 09/30/14 0520 10/01/14 0608  ALBUMIN 1.4* 1.4* 1.3* 1.3*   No results found for this basename: LIPASE, AMYLASE,  in the last 168 hours No results found for this basename: AMMONIA,  in the last 168 hours CBC:  Recent Labs Lab 09/27/14 0529 09/28/14 0533 09/29/14 0610 09/30/14 0520  WBC 6.8 7.2 6.6 7.4  HGB 7.3* 7.5* 8.3* 8.2*  HCT 22.1* 22.4* 25.3* 24.9*  MCV 96.9 95.7 99.6 98.4  PLT 199 192 189 192   Cardiac Enzymes: No results found for this basename: CKTOTAL, CKMB, CKMBINDEX, TROPONINI,  in the last 168 hours BNP (last 3 results)  Recent Labs  09/30/14 0520  PROBNP 8120.0*   CBG:  Recent Labs Lab 09/30/14 1105 09/30/14 1641 09/30/14 2052 10/01/14 0616 10/01/14 1142  GLUCAP 182* 182* 218* 204* 137*    No results found for this or any previous visit (from the past 240 hour(s)).   Studies: No results found.  Scheduled Meds: . amiodarone  400 mg Oral Daily  . calcitRIOL  0.25 mcg Oral BID  . calcium-vitamin D  1 tablet Oral BID  . collagenase   Topical Daily  . febuxostat  80 mg Oral Daily  . furosemide  160 mg Intravenous BID  . insulin aspart  0-15 Units Subcutaneous TID WC  . insulin aspart  0-5 Units Subcutaneous QHS  . insulin aspart  2 Units Subcutaneous TID WC  . insulin glargine  22 Units Subcutaneous QHS  . iron polysaccharides  150 mg Oral BID  . levothyroxine  125 mcg Oral QAC breakfast  . linagliptin  5 mg Oral Daily  . metolazone  2.5 mg Oral QODAY  . multivitamin with minerals  1 tablet Oral Daily  . potassium chloride  20 mEq Oral Daily  . saccharomyces boulardii  250 mg Oral BID  . sodium chloride  3 mL Intravenous Q12H  . warfarin  7.5 mg Oral ONCE-1800  . 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   Obesity hypoventilation syndrome-    Cardiomyopathy, nonischemic- EF 30-35% June 2014   Type 2 diabetes mellitus   Chronic combined systolic and diastolic CHF   Long term (current) use of anticoagulants   CKD (chronic kidney disease) stage 4, GFR 15-29 ml/min   PAF- recurrent despite Amiodarone and multiple cardioversions   Mitral insufficiency, moderate to severe   Chronic massive bilat LE lymphadema   Fall at home- 3 falls this summer, sound orthostatic   Hypotension   Acute  on chronic renal failure   Coarse tremors    Time spent: > 35 minutes    Velvet Bathe  [facility] Pager [phone] If 7PM-7AM, please contact night-coverage at [email or web address], password [facility] 10/01/2014, 2:42 PM  LOS: 22 days

## 2014-10-01 NOTE — Progress Notes (Signed)
Physical Therapy Treatment Patient Details Name: [name] MRN: [id number] DOB: Jul 27, 1950 Today's Date: 10/01/2014    History of Present Illness 64 y.o. female  with history of fibromyalgia, hypothyroidism, solitary kidney, hypothyroidism, diabetes, nonischemic cardiomyopathy with ejection fraction of 35%, atrial fibrillation who presented to the emergency department with left-sided lower leg pain. She was admitted on 9/10 w/ recurrent Lower extremity cellulitis. PCCM asked to see in consult on 9/11 for persistent hypotension and worsening renal failure.  She had a previous admission for LE cellulitis in 07/2014 followed by [place] [facility].  Pt had returned home with HHPT prior to this admission.     PT Comments    Pt admitted with above. Pt currently with functional limitations due to strength, balance and endurance deficits.  Feel that with pts' change in status and medical complexity, would be a good candidate for LTAC.    Pt will benefit from skilled PT to increase their independence and safety with mobility to allow discharge to the venue listed below.   Follow Up Recommendations  LTAC;Supervision/Assistance - 24 hour     Equipment Recommendations  None recommended by PT    Recommendations for Other Services       Precautions / Restrictions Precautions Precautions: Fall Restrictions Weight Bearing Restrictions: No    Mobility  Bed Mobility Overal bed mobility: Needs Assistance;+2 for physical assistance Bed Mobility: Rolling Rolling: Mod assist;Max assist;+2 for physical assistance   Supine to sit: +2 for physical assistance;Mod assist (assist x 3)     General bed mobility comments: raised HOB initially to assist with mobility.  Rolled to replace pads and for toilet hygiene  Transfers Overall transfer level: Needs assistance Equipment used: Rolling walker (2 wheeled) Transfers: Sit to/from Stand Sit to Stand: Mod assist (assist of 3)         General transfer  comment: pt stood partially and was able to maintain for 7-8 seconds with max encouragement.  Pt c/o pain and shaking in UEs. Unable to maintain pushing through arms  Ambulation/Gait                 Stairs            Wheelchair Mobility    Modified Rankin (Stroke Patients Only)       Balance Overall balance assessment: Needs assistance;History of Falls Sitting-balance support: Single extremity supported;Feet unsupported Sitting balance-Leahy Scale: Fair Sitting balance - Comments: pt able to wash and dry face seated at EOB.  Caution used as pt leans at times and monitoring for pt to not slide off bed.     Standing balance support: Bilateral upper extremity supported;During functional activity Standing balance-Leahy Scale: Poor Standing balance comment: unable to stand completely upright even though pt attempted                    Cognition Arousal/Alertness: Awake/alert Behavior During Therapy: Anxious Overall Cognitive Status: Within Functional Limits for tasks assessed                      Exercises      General Comments General comments (skin integrity, edema, etc.): bed in rotation postion      Pertinent Vitals/Pain Pain Assessment: Faces Faces Pain Scale: Hurts whole lot Pain Location: bil LEs Pain Descriptors / Indicators: Aching Pain Intervention(s): Limited activity within patient's tolerance;Monitored during session;Premedicated before session;Repositioned    Home Living  Prior Function            PT Goals (current goals can now be found in the care plan section) Progress towards PT goals: Not progressing toward goals - comment (shaky in UEs today limiting treatment)    Frequency  Min 2X/week    PT Plan Discharge plan needs to be updated    Co-evaluation PT/OT/SLP Co-Evaluation/Treatment: Yes Reason for Co-Treatment: For patient/therapist safety PT goals addressed during session:  Mobility/safety with mobility       End of Session Equipment Utilized During Treatment: Gait belt Activity Tolerance: Patient limited by fatigue;Patient limited by pain Patient left: in bed;with call bell/phone within reach     Time: 1100-1138 PT Time Calculation (min): 38 min  Charges:  $Therapeutic Activity: 23-37 mins                    G CodesDenice Paradise 08-Oct-2014, 12:20 PM Catrena Vari,PT Acute Rehabilitation [phone] (pager)

## 2014-10-01 NOTE — Progress Notes (Signed)
ANTICOAGULATION CONSULT NOTE - Follow Up Consult  Pharmacy Consult for coumadin Indication: atrial fibrillation  Allergies  Allergen Reactions  . Ivp Dye [Iodinated Diagnostic Agents] Shortness Of Breath    Turn red, can't breathe  . Betadine [Povidone Iodine]   . Diphenhydramine Hcl Swelling    In hands and eyes  . Fish Allergy Nausea And Vomiting  . Iohexol      Desc: PT TURNS RED AND WHEEZING   . Penicillins Other (See Comments)    Resp arrest as child. Tolerates cephalosporins.  . Povidone-Iodine Other (See Comments)    Wheezing, turn red, can't breath, and blisters  . Promethazine Hcl Other (See Comments)    Low Blood Pressure  . Tape     Blisters, can use paper tape for short periods  . Clindamycin Hives and Rash    wheezing  . Iodine Rash  . Morphine Sulfate Nausea And Vomiting and Rash  . Primaxin [Imipenem] Rash    Patient Measurements: Height: 5' 6.93" (170 cm) Weight: 301 lb (136.533 kg) IBW/kg (Calculated) : 61.44  Vital Signs: Temp: 98.4 F (36.9 C) (10/02 0526) Temp Source: Oral (10/02 0526) BP: 105/36 mmHg (10/02 0526) Pulse Rate: 99 (10/02 0526)  Labs:  Recent Labs  09/29/14 0610 09/30/14 0520 10/01/14 0608  HGB 8.3* 8.2*  --   HCT 25.3* 24.9*  --   PLT 189 192  --   LABPROT 19.1* 20.0* 21.2*  INR 1.60* 1.70* 1.83*  CREATININE 2.91* 2.88* 2.77*    Estimated Creatinine Clearance: 28.8 ml/min (by C-G formula based on Cr of 2.77).  Assessment: Patient is a 64 y.o F on coumadin for Afib.  INR is sub-therapeutic but increasing up nicely towards goal range.  No bleeding documented.  Goal of Therapy:  INR 2-3    Plan:  1) repeat coumadin 7.5mg  PO x1 today  Sundai Probert P 10/01/2014,9:16 AM

## 2014-10-01 NOTE — Progress Notes (Signed)
Agree Yuval Rubens, MD, MPH, FACS Trauma: [phone] General Surgery: [phone]  

## 2014-10-01 NOTE — Progress Notes (Signed)
Patient ID: [name], female   DOB: 1950/01/06, 64 y.o.   MRN: [id number]    Subjective: Pt without any new complaints.  Objective: Vital signs in last 24 hours: Temp:  [97.7 F (36.5 C)-98.4 F (36.9 C)] 98.4 F (36.9 C) (10/02 0526) Pulse Rate:  [75-99] 99 (10/02 0526) Resp:  [17-18] 18 (10/02 0526) BP: (95-105)/(36-49) 105/36 mmHg (10/02 0526) SpO2:  [93 %-98 %] 93 % (10/02 0526) Weight:  [301 lb (136.533 kg)] 301 lb (136.533 kg) (10/02 0526) Last BM Date: 09/29/14  Intake/Output from previous day: 10/01 0701 - 10/02 0700 In: 960 [P.O.:960] Out: 1550 [Urine:1550] Intake/Output this shift:    PE: Skin: wounds are stable, no change in description of wounds since 2 days ago.  Lab Results:   Recent Labs  09/29/14 0610 09/30/14 0520  WBC 6.6 7.4  HGB 8.3* 8.2*  HCT 25.3* 24.9*  PLT 189 192   BMET  Recent Labs  09/30/14 0520 10/01/14 0608  NA 131* 130*  K 3.9 3.8  CL 91* 91*  CO2 24 25  GLUCOSE 148* 165*  BUN 100* 102*  CREATININE 2.88* 2.77*  CALCIUM 7.3* 7.0*   PT/INR  Recent Labs  09/30/14 0520 10/01/14 0608  LABPROT 20.0* 21.2*  INR 1.70* 1.83*   CMP     Component Value Date/Time   NA 130* 10/01/2014 0608   K 3.8 10/01/2014 0608   CL 91* 10/01/2014 0608   CO2 25 10/01/2014 0608   GLUCOSE 165* 10/01/2014 0608   BUN 102* 10/01/2014 0608   CREATININE 2.77* 10/01/2014 0608   CREATININE 2.10* 05/28/2014 0001   CREATININE 1.28* 02/12/2011 1000   CALCIUM 7.0* 10/01/2014 0608   CALCIUM 9.5 03/24/2012 1212   PROT 6.4 09/22/2014 0500   ALBUMIN 1.3* 10/01/2014 0608   AST 22 09/22/2014 0500   ALT <5 09/22/2014 0500   ALKPHOS 194* 09/22/2014 0500   BILITOT 0.7 09/22/2014 0500   GFRNONAA 17* 10/01/2014 0608   GFRAA 19* 10/01/2014 0608   Lipase  No results found for this basename: lipase       Studies/Results: No results found.  Anti-infectives: Anti-infectives   Start     Dose/Rate Route Frequency Ordered Stop   09/19/14 2000  ceFAZolin (ANCEF)  IVPB 2 g/50 mL premix     2 g 100 mL/hr over 30 Minutes Intravenous Every 8 hours 09/19/14 1307 09/22/14 2032   09/13/14 1200  ceFAZolin (ANCEF) IVPB 2 g/50 mL premix  Status:  Discontinued     2 g 100 mL/hr over 30 Minutes Intravenous Every 12 hours 09/13/14 1141 09/19/14 1307   09/13/14 1000  levofloxacin (LEVAQUIN) IVPB 500 mg  Status:  Discontinued     500 mg 100 mL/hr over 60 Minutes Intravenous Every 48 hours 09/11/14 0958 09/12/14 1640   09/11/14 2300  vancomycin (VANCOCIN) 1,750 mg in sodium chloride 0.9 % 500 mL IVPB  Status:  Discontinued     1,750 mg 250 mL/hr over 120 Minutes Intravenous Every 48 hours 09/09/14 2158 09/13/14 1139   09/11/14 1800  ciprofloxacin (CIPRO) IVPB 400 mg  Status:  Discontinued     400 mg 200 mL/hr over 60 Minutes Intravenous Every 24 hours 09/10/14 1723 09/11/14 0913   09/11/14 1030  levofloxacin (LEVAQUIN) IVPB 500 mg     500 mg 100 mL/hr over 60 Minutes Intravenous  Once 09/11/14 0958 09/11/14 1141   09/10/14 1645  ciprofloxacin (CIPRO) IVPB 400 mg     400 mg 200 mL/hr over 60  Minutes Intravenous STAT 09/10/14 1640 09/10/14 1844   09/09/14 2300  metroNIDAZOLE (FLAGYL) IVPB 500 mg  Status:  Discontinued     500 mg 100 mL/hr over 60 Minutes Intravenous Every 8 hours 09/09/14 2124 09/11/14 0915   09/09/14 2200  vancomycin (VANCOCIN) 1,500 mg in sodium chloride 0.9 % 500 mL IVPB     1,500 mg 250 mL/hr over 120 Minutes Intravenous  Once 09/09/14 2158 09/10/14 0207   09/09/14 1800  vancomycin (VANCOCIN) IVPB 1000 mg/200 mL premix     1,000 mg 200 mL/hr over 60 Minutes Intravenous  Once 09/09/14 1754 09/09/14 2042       Assessment/Plan  1. 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 Continue BID dressing changes to these wounds.  Use santyl daily. 2. Cont PT hydrotherapy.  3. Will recheck wounds on Monday.  If patient is medically stable and needs further wound care, would recommend LTAC for more hydrotherapy and wound care  prior to dc home.   LOS: 22 days    Ina Scrivens E 10/01/2014, 8:01 AM Pager: [phone]

## 2014-10-01 NOTE — Progress Notes (Addendum)
Occupational Therapy Treatment Patient Details Name: [name] MRN: [id number] DOB: Oct 25, 1950 Today's Date: 10/01/2014    History of present illness 64 y.o. female  with history of fibromyalgia, hypothyroidism, solitary kidney, hypothyroidism, diabetes, nonischemic cardiomyopathy with ejection fraction of 35%, atrial fibrillation who presented to the emergency department with left-sided lower leg pain. She was admitted on 9/10 w/ recurrent Lower extremity cellulitis. PCCM asked to see in consult on 9/11 for persistent hypotension and worsening renal failure.  She had a previous admission for LE cellulitis in 07/2014 followed by [place] [facility].  Pt had returned home with HHPT prior to this admission.    OT comments  Pt is motivated and tries hard but is limited with mobility due to weakness and pain.  Bil UEs shaky today,but she was able to complete grooming tasks. She had difficulty maintaining UEs on walker  Follow Up Recommendations  SNF (would pt be an LTACH candidate?)    Equipment Recommendations  None recommended by OT    Recommendations for Other Services      Precautions / Restrictions Precautions Precautions: Fall       Mobility Bed Mobility   Bed Mobility: Rolling Rolling: Mod assist;Max assist;+2 for physical assistance   Supine to sit: +2 for physical assistance;Mod assist (assist x 3)     General bed mobility comments: raised HOB initially to assist with mobility.  Rolled to replace pads and for toilet hygiene  Transfers   Equipment used: Rolling walker (2 wheeled) Transfers: Sit to/from Stand Sit to Stand: Mod assist (assist of 3)         General transfer comment: pt stood partially and was able to maintain for 7-8 seconds with max encouragement.  Pt c/o pain and shaking in UEs. Unable to maintain pushing through arms    Balance   Sitting-balance support: Single extremity supported;Feet unsupported Sitting balance-Leahy Scale: Fair     Standing balance  support: Bilateral upper extremity supported Standing balance-Leahy Scale: Poor Standing balance comment: unable to stand completely                   ADL       Grooming: Wash/dry face;Brushing hair;Sitting                       Toileting- Clothing Manipulation and Hygiene: +2 for physical assistance;Total assistance;Bed level (assist x 3)         General ADL Comments: A x 3 for sit to stand x 4 trials.       Vision                     Perception     Praxis      Cognition   Behavior During Therapy: Anxious Overall Cognitive Status: Within Functional Limits for tasks assessed                       Extremity/Trunk Assessment               Exercises     Shoulder Instructions       General Comments      Pertinent Vitals/ Pain       Pain Assessment: Faces Faces Pain Scale: Hurts whole lot Pain Location: bil LEs Pain Descriptors / Indicators: Aching Pain Intervention(s): Limited activity within patient's tolerance;Monitored during session;Premedicated before session;Repositioned  Home Living  Prior Functioning/Environment              Frequency Min 2X/week     Progress Toward Goals  OT Goals(current goals can now be found in the care plan section)  Progress towards OT goals: Progressing toward goals (slowly)     Plan      Co-evaluation                 End of Session     Activity Tolerance Patient tolerated treatment well   Patient Left in bed;with call bell/phone within reach;with family/visitor present   Nurse Communication        Co Tx with PT for safety OT addressed ADLs  Time: 8832-5498 OT Time Calculation (min): 52 min  Charges: OT General Charges $OT Visit: 1 Procedure OT Treatments $Therapeutic Activity: 8-22 mins  Danae Oland 10/01/2014, 12:03 PM  Lesle Chris, OTR/L [phone] 10/01/2014

## 2014-10-01 NOTE — Progress Notes (Signed)
Physical Therapy Wound Treatment Patient Details  Name: BLIMI GODBY MRN: [id number] Date of Birth: [birth year]  Today's Date: 10/01/2014 Time: 3295-1884 Time Calculation (min): 78 min  Subjective  Subjective: Pt reports her skin blisters with all dressings/adhesive except tegaderm (including pink foam) Patient and Family Stated Goals: decr pain in wounds and heal without surgery Date of Onset:  (multiple areas; exact dates unknown) Prior Treatments: mepitel foam, various types of tape  Pain Score:   10/10 during treatment of Lt posterior thigh; medicated for pain, limited treatment to patient tolerance  Wound Assessment  Clinical Statement: Periwound for Rt lateral thigh and Lt posterior/inner thigh with incr erythema. Lt posterior thigh continues to be a difficult wound to access for treatment due to body habitus and positioning with air mattress. Removed most of dry top layer of eschar for all 3 wounds.  Pressure Ulcer 09/20/14 Deep Tissue Injury - Purple or maroon localized area of discolored intact skin or blood-filled blister due to damage of underlying soft tissue from pressure and/or shear. (Active)  Dressing Type Barrier Film (skin prep);Gauze (Comment);Transparent dressing;Moist to moist 10/01/2014 10:38 AM  Dressing Changed;Clean;Dry;Intact 10/01/2014 10:38 AM  Dressing Change Frequency Twice a day 10/01/2014 10:38 AM  State of Healing Eschar 10/01/2014 10:38 AM  Site / Wound Assessment Granulation tissue;Yellow;Painful 10/01/2014 10:38 AM  % Wound base Red or Granulating 5% 10/01/2014 10:38 AM  % Wound base Yellow 95% 10/01/2014 10:38 AM  % Wound base Black 0% 10/01/2014 10:38 AM  % Wound base Other (Comment) 0% 10/01/2014 10:38 AM  Peri-wound Assessment Pink 10/01/2014 10:38 AM  Wound Length (cm) 3.8 cm 09/30/2014 10:24 AM  Wound Width (cm) 5.7 cm 09/30/2014 10:24 AM  Margins Unattached edges (unapproximated) 10/01/2014 10:38 AM  Drainage Amount Moderate 10/01/2014 10:38 AM   Drainage Description Serous 10/01/2014 10:38 AM  Treatment Debridement (Selective);Hydrotherapy (Pulse lavage) 10/01/2014 10:38 AM     Wound / Incision (Open or Dehisced) 09/10/14 Other (Comment) Thigh Medial;Left;Posterior;Proximal (Active)  Dressing Type Gauze (Comment);Barrier Film (skin prep);Transparent dressing;Moist to moist 10/01/2014 10:38 AM  Dressing Changed Changed 10/01/2014 10:38 AM  Dressing Status Clean;Dry;Intact 10/01/2014 10:38 AM  Dressing Change Frequency Twice a day 10/01/2014 10:38 AM  Site / Wound Assessment Black;Granulation tissue;Painful;Pink;Yellow 10/01/2014 10:38 AM  % Wound base Red or Granulating 10% 10/01/2014 10:38 AM  % Wound base Yellow 75% 10/01/2014 10:38 AM  % Wound base Black 15% 10/01/2014 10:38 AM  Peri-wound Assessment Denuded;Edema;Maceration;Pink;Erythema (non-blanchable);Induration 10/01/2014 10:38 AM  Wound Length (cm) 9.1 cm 09/30/2014 10:24 AM  Wound Width (cm) 7.3 cm 09/30/2014 10:24 AM  Margins Unattached edges (unapproximated) 10/01/2014 10:38 AM  Closure None 10/01/2014 10:38 AM  Drainage Amount Copious 10/01/2014 10:38 AM  Drainage Description Serous 10/01/2014 10:38 AM  Non-staged Wound Description Full thickness 10/01/2014 10:38 AM  Treatment Debridement (Selective);Hydrotherapy (Pulse lavage) 10/01/2014 10:38 AM     Wound / Incision (Open or Dehisced) 09/18/14 Other (Comment) Hip Right Black eschar tissue surrounded by pink tissue (Active)  Dressing Type Gauze (Comment);Transparent dressing;Other (Comment);Barrier Film (skin prep);Moist to moist 10/01/2014 10:38 AM  Dressing Changed Changed 10/01/2014 10:38 AM  Dressing Status Clean;Dry;Intact 10/01/2014 10:38 AM  Dressing Change Frequency Twice a day 10/01/2014 10:38 AM  Site / Wound Assessment Black;Brown;Granulation tissue;Painful;Pink;Yellow 10/01/2014 10:38 AM  % Wound base Red or Granulating 5% 10/01/2014 10:38 AM  % Wound base Yellow 95% 10/01/2014 10:38 AM  % Wound base Black 0% 10/01/2014 10:38  AM  % Wound base Other (Comment) 0% 10/01/2014 10:38 AM  Peri-wound Assessment Denuded;Edema;Pink;Erythema (non-blanchable);Induration 10/01/2014 10:38 AM  Wound Length (cm) 9 cm 09/30/2014 10:24 AM  Wound Width (cm) 8.8 cm 09/30/2014 10:24 AM  Margins Unattached edges (unapproximated) 10/01/2014 10:38 AM  Closure None 10/01/2014 10:38 AM  Drainage Amount Copious 10/01/2014 10:38 AM  Drainage Description Serous 10/01/2014 10:38 AM  Non-staged Wound Description Full thickness 10/01/2014 10:38 AM  Treatment Debridement (Selective);Hydrotherapy (Pulse lavage) 10/01/2014 10:38 AM     Incision 05/05/12 Perineum Other (Comment) (Active)   Hydrotherapy Pulsed lavage therapy - wound location: Rt lateral hip, Lt lateral thigh, Lt proximal-posteriomedial thigh Pulsed Lavage with Suction (psi): 4 psi (to 8) Pulsed Lavage with Suction - Normal Saline Used: Other (comment) (1500 ml tolerated) Pulsed Lavage Tip: Tip with splash shield Selective Debridement Selective Debridement - Location: Rt lateral hip, Lt lateral thigh, Lt proximal-posteriomedial thigh Selective Debridement - Tools Used: Forceps;Scalpel Selective Debridement - Tissue Removed: yellow and black/brown eschar; scoring eschar with scalpel   Wound Assessment and Plan  Wound Therapy - Assess/Plan/Recommendations Wound Therapy - Clinical Statement: Periwound for Rt lateral thigh and Lt posterior/inner thigh with incr erythema. Lt posterior thigh continues to be a difficult wound to access for treatment due to body habitus and positioning with air mattress. Removed most of dry top layer of eschar for all 3 wounds. Wound Therapy - Functional Problem List: painful wounds limiting her mobility tolerance Factors Delaying/Impairing Wound Healing: Diabetes Mellitus;Infection - systemic/local;Immobility;Multiple medical problems;Vascular compromise Hydrotherapy Plan: Debridement;Dressing change;Patient/family education;Pulsatile lavage with suction Wound  Therapy - Frequency: 6X / week Wound Therapy - Current Recommendations: WOC nurse Wound Therapy - Follow Up Recommendations: Skilled nursing facility Wound Plan: see above  Wound Therapy Goals- Improve the function of patient's integumentary system by progressing the wound(s) through the phases of wound healing (inflammation - proliferation - remodeling) by: Decrease Necrotic Tissue to: <75% Decrease Necrotic Tissue - Progress: Progressing toward goal Increase Granulation Tissue to: >25% Increase Granulation Tissue - Progress: Progressing toward goal Improve Drainage Characteristics: Min Improve Drainage Characteristics - Progress: Progressing toward goal  Goals will be updated until maximal potential achieved or discharge criteria met.  Discharge criteria: when goals achieved, discharge from hospital, MD decision/surgical intervention, no progress towards goals, refusal/missing three consecutive treatments without notification or medical reason.  GP     Brentyn Seehafer 10/01/2014, 10:52 AM Pager [phone]

## 2014-10-01 NOTE — Progress Notes (Signed)
       Patient Name: [name] Date of Encounter: 10/01/2014    SUBJECTIVE:No major CV complaints. Amio started 9/10(high dose). Diuresis now being diurected by nephrology.  TELEMETRY:  AF with rate control. Filed Vitals:   09/30/14 0645 09/30/14 1405 09/30/14 2108 10/01/14 0526  BP:  95/47 95/49 105/36  Pulse:  75 87 99  Temp:  97.7 F (36.5 C) 98.4 F (36.9 C) 98.4 F (36.9 C)  TempSrc:  Oral Oral Oral  Resp:  17 18 18   Height:      Weight: 416 lb (188.696 kg)   301 lb (136.533 kg)  SpO2:  95% 98% 93%    Intake/Output Summary (Last 24 hours) at 10/01/14 1210 Last data filed at 10/01/14 0800  Gross per 24 hour  Intake   1200 ml  Output   1550 ml  Net   -350 ml   LABS: Basic Metabolic Panel:  Recent Labs  09/30/14 0520 10/01/14 0608  NA 131* 130*  K 3.9 3.8  CL 91* 91*  CO2 24 25  GLUCOSE 148* 165*  BUN 100* 102*  CREATININE 2.88* 2.77*  CALCIUM 7.3* 7.0*  MG 1.9 1.9  PHOS 5.5* 5.1*   CBC:  Recent Labs  09/29/14 0610 09/30/14 0520  WBC 6.6 7.4  HGB 8.3* 8.2*  HCT 25.3* 24.9*  MCV 99.6 98.4  PLT 189 192     Radiology/Studies:  No new data  Physical Exam: Blood pressure 105/36, pulse 99, temperature 98.4 F (36.9 C), temperature source Oral, resp. rate 18, height 5' 6.93" (1.7 m), weight 301 lb (136.533 kg), SpO2 93.00%. Weight change: -115 lb (-52.164 kg)  Wt Readings from Last 3 Encounters:  10/01/14 301 lb (136.533 kg)  09/02/14 304 lb (137.893 kg)  08/03/14 340 lb 9.8 oz (154.5 kg)    Morbid obesity  ASSESSMENT:  1. Atrial fib with controlled rate on amiodarone for 22 days. 2. Chronic systolic HF, stable, as acute component resolving with diuresis 3. A/C kidney disease stage 4   Plan:  1. Decrease amiodarone to 200 mg daily. 2. Diuresis per nephrology.  Demetrios Isaacs 10/01/2014, 12:10 PM

## 2014-10-02 DIAGNOSIS — I5042 Chronic combined systolic (congestive) and diastolic (congestive) heart failure: Secondary | ICD-10-CM

## 2014-10-02 LAB — RENAL FUNCTION PANEL
ALBUMIN: 1.3 g/dL — AB (ref 3.5–5.2)
Anion gap: 11 (ref 5–15)
BUN: 105 mg/dL — ABNORMAL HIGH (ref 6–23)
CALCIUM: 7 mg/dL — AB (ref 8.4–10.5)
CO2: 27 mEq/L (ref 19–32)
Chloride: 93 mEq/L — ABNORMAL LOW (ref 96–112)
Creatinine, Ser: 2.75 mg/dL — ABNORMAL HIGH (ref 0.50–1.10)
GFR calc non Af Amer: 17 mL/min — ABNORMAL LOW (ref >90)
GFR, EST AFRICAN AMERICAN: 20 mL/min — AB (ref >90)
GLUCOSE: 169 mg/dL — AB (ref 70–99)
PHOSPHORUS: 5 mg/dL — AB (ref 2.3–4.6)
POTASSIUM: 3.6 meq/L — AB (ref 3.7–5.3)
SODIUM: 131 meq/L — AB (ref 137–147)

## 2014-10-02 LAB — GLUCOSE, CAPILLARY
GLUCOSE-CAPILLARY: 201 mg/dL — AB (ref 70–99)
GLUCOSE-CAPILLARY: 170 mg/dL — AB (ref 70–99)
Glucose-Capillary: 242 mg/dL — ABNORMAL HIGH (ref 70–99)
Glucose-Capillary: 200 mg/dL — ABNORMAL HIGH (ref 70–99)

## 2014-10-02 LAB — MAGNESIUM: MAGNESIUM: 2 mg/dL (ref 1.5–2.5)

## 2014-10-02 LAB — PROTIME-INR
INR: 2.1 — AB (ref 0.00–1.49)
PROTHROMBIN TIME: 23.6 s — AB (ref 11.6–15.2)

## 2014-10-02 MED ORDER — WARFARIN SODIUM 5 MG PO TABS
5.0000 mg | ORAL_TABLET | Freq: Once | ORAL | Status: AC
  Administered 2014-10-02: 5 mg via ORAL
  Filled 2014-10-02: qty 1

## 2014-10-02 NOTE — Progress Notes (Signed)
Patient Profile: [name] is an 63 y.o. 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   We were consulted for in regards to management of volume overload in the setting of chronic CHF and CKD Stage IV with hypoalbuminemia. Currently diuresis per nephrology (per Dr. Tamala Julian note)  Subjective: No major complaints.    Objective: Vital signs in last 24 hours: Temp:  [98 F (36.7 C)-98.6 F (37 C)] 98 F (36.7 C) (10/03 0521) Pulse Rate:  [88-95] 95 (10/03 0521) Resp:  [17-18] 18 (10/03 0521) BP: (80-92)/(36-48) 85/36 mmHg (10/03 0521) SpO2:  [90 %-92 %] 90 % (10/03 0521) Last BM Date: 10/02/14  Intake/Output from previous day: 10/02 0701 - 10/03 0700 In: 480 [P.O.:480] Out: 1601 [Urine:1600; Stool:1] Intake/Output this shift:    Medications Current Facility-Administered Medications  Medication Dose Route Frequency Provider Last Rate Last Dose  . albuterol (PROVENTIL) (2.5 MG/3ML) 0.083% nebulizer solution 2.5 mg  2.5 mg Nebulization Q6H PRN Ivor Costa, MD      . ALPRAZolam Duanne Moron) tablet 0.25 mg  0.25 mg Oral BID PRN Hosie Poisson, MD   0.25 mg at 09/29/14 1608  . amiodarone (PACERONE) tablet 400 mg  400 mg Oral Daily Leonie Man, MD   400 mg at 10/02/14 1022  . calcitRIOL (ROCALTROL) capsule 0.25 mcg  0.25 mcg Oral BID Ivor Costa, MD   0.25 mcg at 10/02/14 1023  . calcium carbonate (TUMS - dosed in mg elemental calcium) chewable tablet 200 mg of elemental calcium  1 tablet Oral BID PRN Geradine Girt, DO   200 mg of elemental calcium at 09/14/14 1603  . calcium-vitamin D (OSCAL WITH D) 500-200 MG-UNIT per tablet 1 tablet  1 tablet Oral BID Ivor Costa, MD   1 tablet at  10/02/14 1022  . collagenase (SANTYL) ointment   Topical Daily Velvet Bathe, MD      . febuxostat (ULORIC) tablet 80 mg  80 mg Oral Daily Ivor Costa, MD   80 mg at 10/02/14 1023  . furosemide (LASIX) 160 mg in dextrose 5 % 50 mL IVPB  160 mg Intravenous BID Juluis Mire, MD   160 mg at 10/02/14 0814  . HYDROcodone-acetaminophen (NORCO) 10-325 MG per tablet 1 tablet  1 tablet Oral Q4H PRN Hosie Poisson, MD   1 tablet at 10/02/14 0912  . hydrOXYzine (ATARAX/VISTARIL) tablet 25 mg  25 mg Oral Q4H PRN Ivor Costa, MD   25 mg at 09/18/14 2219  . insulin aspart (novoLOG) injection 0-15 Units  0-15 Units Subcutaneous TID WC Geradine Girt, DO   5 Units at 10/02/14 0617  . insulin aspart (novoLOG) injection 0-5 Units  0-5 Units Subcutaneous QHS Geradine Girt, DO   2 Units at 09/30/14 2336  . insulin aspart (novoLOG) injection 2 Units  2 Units Subcutaneous TID WC Hosie Poisson, MD   2 Units at 10/02/14 0700  . insulin glargine (LANTUS) injection 22 Units  22 Units Subcutaneous QHS Hosie Poisson, MD   22 Units at 10/01/14 2210  . iron polysaccharides (NIFEREX) capsule 150 mg  150 mg Oral BID Ivor Costa, MD   150 mg at 10/02/14 1023  . levothyroxine (SYNTHROID, LEVOTHROID) tablet 125 mcg  125 mcg Oral QAC  breakfast Ivor Costa, MD   125 mcg at 10/02/14 0544  . linagliptin (TRADJENTA) tablet 5 mg  5 mg Oral Daily Wilhelmina Mcardle, MD   5 mg at 10/02/14 1025  . metolazone (ZAROXOLYN) tablet 2.5 mg  2.5 mg Oral Acquanetta Belling, MD   2.5 mg at 10/02/14 0813  . multivitamin with minerals tablet 1 tablet  1 tablet Oral Daily Ivor Costa, MD   1 tablet at 09/28/14 1136  . nitroGLYCERIN (NITROSTAT) SL tablet 0.4 mg  0.4 mg Sublingual Q5 min PRN Velvet Bathe, MD      . potassium chloride (K-DUR) CR tablet 20 mEq  20 mEq Oral Daily Rhonda G Barrett, PA-C   20 mEq at 10/02/14 1022  . saccharomyces boulardii (FLORASTOR) capsule 250 mg  250 mg Oral BID Ivor Costa, MD   250 mg at 10/02/14 1022  . sodium chloride 0.9 % bolus  1,000 mL  1,000 mL Intravenous PRN Erick Colace, NP      . sodium chloride 0.9 % bolus 1,000 mL  1,000 mL Intravenous PRN Erick Colace, NP      . sodium chloride 0.9 % injection 10-40 mL  10-40 mL Intracatheter PRN Raylene Miyamoto, MD   10 mL at 10/01/14 1733  . sodium chloride 0.9 % injection 3 mL  3 mL Intravenous Q12H Ivor Costa, MD   3 mL at 09/30/14 1007  . Warfarin - Pharmacist Dosing Inpatient   Does not apply q1800 Saundra Shelling, [facility]        PE: General appearance: alert, cooperative, no distress and morbidly obese Lungs: clear to auscultation bilaterally Heart: irregularly irregular rhythm Extremities: 3+ bilateral LEE Pulses: 2+ and symmetric Skin: warm and dry Neurologic: Grossly normal  Lab Results:   Recent Labs  09/30/14 0520  WBC 7.4  HGB 8.2*  HCT 24.9*  PLT 192   BMET  Recent Labs  09/30/14 0520 10/01/14 0608 10/02/14 0445  NA 131* 130* 131*  K 3.9 3.8 3.6*  CL 91* 91* 93*  CO2 24 25 27   GLUCOSE 148* 165* 169*  BUN 100* 102* 105*  CREATININE 2.88* 2.77* 2.75*  CALCIUM 7.3* 7.0* 7.0*   PT/INR  Recent Labs  09/30/14 0520 10/01/14 0608 10/02/14 0445  LABPROT 20.0* 21.2* 23.6*  INR 1.70* 1.83* 2.10*   Assessment/Plan   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 Acute on Chronic Systolic HF: Nephrology directing  diuresis. Weight 301 from 350. Continue current plan.   2. Persistent A-fib: rate is controlled. Continue amiodarone. Continue warfarin.   3. Anticoagulation: Being followed by pharmacy. She needs to be therapeutic not only for stroke prophylaxis but also for DVT prophylaxis as she has been very sedentary this admission.   4. CKD: nephrology following.  5. Hypotension:  She refuses an arterial line. Likely spurious at times.    LOS: 23 days    Candee Furbish, MD   10/02/2014 10:32 AM

## 2014-10-02 NOTE — Progress Notes (Signed)
Dressing to the right medial wound changed. The rest of the dressing changed by OT.

## 2014-10-02 NOTE — Progress Notes (Signed)
Patient's bed need to be zeroed to be weighed, but patient refused and does not want to be bothered. In coming nurse and charge nurse will be notified.

## 2014-10-02 NOTE — Progress Notes (Signed)
Physical Therapy Wound Treatment Patient Details  Name: [name] MRN: [id number] Date of Birth: 01-15-1950  Today's Date: 10/02/2014 Time: 0915-1025 Time Calculation (min): 70 min  Subjective  Subjective: Pt reports her skin blisters with all dressings/adhesive except tegaderm (including pink foam) Patient and Family Stated Goals: decr pain in wounds and heal without surgery Date of Onset:  (multiple areas exact date unknown) Prior Treatments: mepitel foam, various types of tape  Pain Score: Pain Score: 4   Wound Assessment  Pressure Ulcer 09/20/14 Deep Tissue Injury - Purple or maroon localized area of discolored intact skin or blood-filled blister due to damage of underlying soft tissue from pressure and/or shear. (Active)  Dressing Type Barrier Film (skin prep);Gauze (Comment);Transparent dressing 10/02/2014 10:34 AM  Dressing Changed 10/02/2014 10:34 AM  Dressing Change Frequency Twice a day 10/02/2014 10:34 AM  State of Healing Eschar 10/02/2014 10:34 AM  Site / Wound Assessment Yellow;Granulation tissue;Painful 10/02/2014 10:34 AM  % Wound base Red or Granulating 5% 10/02/2014 10:34 AM  % Wound base Yellow 95% 10/02/2014 10:34 AM  % Wound base Black 0% 10/02/2014 10:34 AM  % Wound base Other (Comment) 0% 10/02/2014 10:34 AM  Peri-wound Assessment Pink 10/02/2014 10:34 AM  Wound Length (cm) 3.8 cm 09/30/2014 10:24 AM  Wound Width (cm) 5.7 cm 09/30/2014 10:24 AM  Margins Unattached edges (unapproximated) 10/02/2014 10:34 AM  Drainage Amount Moderate 10/02/2014 10:34 AM  Drainage Description Serous 10/02/2014 10:34 AM  Treatment Debridement (Selective);Hydrotherapy (Pulse lavage) 10/02/2014 10:34 AM        Wound / Incision (Open or Dehisced) 09/10/14 Other (Comment) Thigh Medial;Left;Posterior;Proximal (Active)  Dressing Type Gauze (Comment);Barrier Film (skin prep);Transparent dressing 10/02/2014 10:34 AM  Dressing Changed Changed 10/02/2014 10:34 AM  Dressing Status Clean;Dry;Intact  10/02/2014 10:34 AM  Dressing Change Frequency Twice a day 10/02/2014 10:34 AM  Site / Wound Assessment Black;Yellow;Pink;Granulation tissue;Painful 10/02/2014 10:34 AM  % Wound base Red or Granulating 10% 10/02/2014 10:34 AM  % Wound base Yellow 75% 10/02/2014 10:34 AM  % Wound base Black 15% 10/02/2014 10:34 AM  Peri-wound Assessment Denuded;Edema;Erythema (non-blanchable);Induration;Maceration;Pink 10/02/2014 10:34 AM  Wound Length (cm) 9.1 cm 09/30/2014 10:24 AM  Wound Width (cm) 7.3 cm 09/30/2014 10:24 AM  Margins Unattached edges (unapproximated) 10/02/2014 10:34 AM  Closure None 10/02/2014 10:34 AM  Drainage Amount Copious 10/02/2014 10:34 AM  Drainage Description Serous 10/02/2014 10:34 AM  Non-staged Wound Description Full thickness 10/02/2014 10:34 AM  Treatment Debridement (Selective);Hydrotherapy (Pulse lavage) 10/02/2014 10:34 AM     Wound / Incision (Open or Dehisced) 09/18/14 Other (Comment) Hip Right Black eschar tissue surrounded by pink tissue (Active)  Dressing Type Gauze (Comment);Barrier Film (skin prep);Transparent dressing 10/02/2014 10:34 AM  Dressing Changed Changed 10/02/2014 10:34 AM  Dressing Status Clean;Dry;Intact 10/02/2014 10:34 AM  Dressing Change Frequency Twice a day 10/02/2014 10:34 AM  Site / Wound Assessment Granulation tissue;Yellow;Pink 10/02/2014 10:34 AM  % Wound base Red or Granulating 5% 10/02/2014 10:34 AM  % Wound base Yellow 95% 10/02/2014 10:34 AM  % Wound base Black 0% 10/02/2014 10:34 AM  % Wound base Other (Comment) 0% 10/02/2014 10:34 AM  Peri-wound Assessment Denuded;Edema;Pink;Erythema (non-blanchable);Induration 10/02/2014 10:34 AM  Wound Length (cm) 9 cm 09/30/2014 10:24 AM  Wound Width (cm) 8.8 cm 09/30/2014 10:24 AM  Margins Unattached edges (unapproximated) 10/02/2014 10:34 AM  Closure None 10/02/2014 10:34 AM  Drainage Amount Copious 10/02/2014 10:34 AM  Drainage Description Serous 10/02/2014 10:34 AM  Non-staged Wound Description Full thickness 10/02/2014  10:34 AM  Treatment Debridement (  Selective);Hydrotherapy (Pulse lavage) 10/02/2014 10:34 AM   Hydrotherapy Pulsed lavage therapy - wound location: Rt lateral hip, Lt lateral thigh, Lt proximal-posteriomedial thigh Pulsed Lavage with Suction (psi): 4 psi (to 8) Pulsed Lavage with Suction - Normal Saline Used: 2000 mL Pulsed Lavage Tip: Tip with splash shield Selective Debridement Selective Debridement - Location: Rt lateral hip, Lt lateral thigh, Lt proximal-posteriomedial thigh Selective Debridement - Tools Used: Forceps;Scalpel;Scissors Selective Debridement - Tissue Removed: yellow and black/brown eschar; scoring eschar with scalpel   Wound Assessment and Plan  Wound Therapy - Assess/Plan/Recommendations Wound Therapy - Clinical Statement: Pt continues to present with wounds largely covered by eschar/slough and with periwound areas being macerated. May benefit from interdry material that can help wick moisture. Wound Therapy - Functional Problem List: painful wounds limiting her mobility tolerance Factors Delaying/Impairing Wound Healing: Diabetes Mellitus;Infection - systemic/local;Immobility;Multiple medical problems;Vascular compromise Hydrotherapy Plan: Debridement;Dressing change;Patient/family education;Pulsatile lavage with suction Wound Therapy - Frequency: 6X / week Wound Therapy - Current Recommendations: WOC nurse Wound Therapy - Follow Up Recommendations: Skilled nursing facility Wound Plan: see above  Wound Therapy Goals- Improve the function of patient's integumentary system by progressing the wound(s) through the phases of wound healing (inflammation - proliferation - remodeling) by: Decrease Necrotic Tissue to: <75% Decrease Necrotic Tissue - Progress: Progressing toward goal Increase Granulation Tissue to: >25% Increase Granulation Tissue - Progress: Progressing toward goal Improve Drainage Characteristics: Min Improve Drainage Characteristics - Progress: Progressing  toward goal  Goals will be updated until maximal potential achieved or discharge criteria met.  Discharge criteria: when goals achieved, discharge from hospital, MD decision/surgical intervention, no progress towards goals, refusal/missing three consecutive treatments without notification or medical reason.  GP     Ayla Dunigan 10/02/2014, 10:50 AM  [facility] PT [phone]

## 2014-10-02 NOTE — Progress Notes (Signed)
[facility] PROGRESS NOTE  [name] MOQ:[id number] DOB: 08-30-1950 DOA: 09/09/2014 PCP: Elby Showers, MD   Assessment/Plan:   Septic shock due to Left lower leg cellulitis:  - Initially on Vanc/CIpro/FLagyl from 9/11  - changed to IV cefazolin from 9/14  - blood culture 1/2 group B strep, completed 14 days of antibiotics, between Vanc and ancef  - Ancef stopped 9/24  - Wound care consult placed.  General surgery on board and assisting with case.  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- EF 30-35% June 2014  -remains volume overload, anasarca, third spacing due to severe hypoalbuminemia and CKD 3-4  -was being diuresed with IV lasix, changed to Po demadex and metolazone per Cards over weekend  -creatinine trending up, baseline 2, negative 18.3L, but weight still trending up despite being net negative everyday  -no ACE due to AKI  -baseline dry weight unclear around 330lbs -Renal on board and now managing IV lasix dose   Hypotension - Suspect blood pressure readings not accurate as patient has had reported systolic blood pressure is a 60 but has been able to carry a full conversation with staff. - Due to patient's body habitus I suspect patient's blood pressure recordings are inaccurate. - Cardiology and nephrology assisting with diuresis, patient refuses A line. Discussed with patient further who does not wish for an A line after discussion.  RUE swelling  -dopplers 9/22 negative, due to third spacing   Type 2 diabetes mellitus:  - A1c 7.0  - lantus increased to 22 units.  - SSI, blood sugars relatively controlled on this regimen  . PAF (paroxysmal atrial fibrillation)  - Currently with a paced rhythm  - Coumadin dosed by pharmacy -  continue amiodarone   . Hypothyroidism, acquired, autoimmune: Recent TSH was 2.12 on 05/28/14.  - Continue levothyroxine   Gout: stable  -Continue Uloric   . AKI on CKD (chronic kidney disease) stage  4, GFR 15-29 ml/min: ccl slightly higher from baseline 2.5-3.00 to 3.38 on admission  - Currently at baseline  -Followed by Dr.Dunham, Renal consulted given overloaded state with CKD, CHF and Hypoalbuminemia  -now on high dose of IV lasix and metolazone   . PACEMAKER, PERMANENT  . Obesity hypoventilation syndrome  - CPAP QHS   . OBESITY, MORBID  Tremors/shakes  -gabapentin stopped, monitor  -improving   Anemia:  -due to CKD and acute illness and iron deficiency -baseline around 9, transfused 2units PRBC 9/22  -Anemia panel c/w Chronic disease, transfuse if Hb <7   Code Status: full  Family Communication: patient  Disposition Plan: SNF when stable  Consultants:  PCCM  Cards Nephrology   Procedures:  Central venous catheter insertion procedure on 09/10/2049   Code Status: full Family Communication: discussed with patient directly Disposition Plan: pending recommendations from specialist involved.   HPI/Subjective: NO new complaints. Blood pressures still recorded low but patient alert.  She does not wish for an A line despite recommendations for one for closer monitoring of her blood pressure.  Objective: Filed Vitals:   10/02/14 0521  BP: 85/36  Pulse: 95  Temp: 98 F (36.7 C)  Resp: 18    Intake/Output Summary (Last 24 hours) at 10/02/14 1007 Last data filed at 10/02/14 0511  Gross per 24 hour  Intake    120 ml  Output   1601 ml  Net  -1481 ml   Filed Weights   09/30/14 0645 10/01/14 0526  Weight: 188.696 kg (416 lb) 136.533 kg (301  lb)    Exam:   General:  Patient in no acute distress, alert and awake  Cardiovascular: S1 and S2 present  Respiratory: No audible wheezes, equal chest rise  Abdomen: Nontender, nondistended  Neuro: answers questions appropriately and moves extremities equally  Data Reviewed: Basic Metabolic Panel:  Recent Labs Lab 09/28/14 0533 09/29/14 0610 09/30/14 0520 10/01/14 0608 10/02/14 0445  NA 130* 131* 131*  130* 131*  K 4.0 3.8 3.9 3.8 3.6*  CL 92* 92* 91* 91* 93*  CO2 24 25 24 25 27   GLUCOSE 200* 160* 148* 165* 169*  BUN 95* 95* 100* 102* 105*  CREATININE 2.95* 2.91* 2.88* 2.77* 2.75*  CALCIUM 7.5* 7.4* 7.3* 7.0* 7.0*  MG 1.9 1.9 1.9 1.9 2.0  PHOS 5.6* 5.5* 5.5* 5.1* 5.0*   Liver Function Tests:  Recent Labs Lab 09/28/14 0533 09/29/14 0610 09/30/14 0520 10/01/14 0608 10/02/14 0445  ALBUMIN 1.4* 1.4* 1.3* 1.3* 1.3*   No results found for this basename: LIPASE, AMYLASE,  in the last 168 hours No results found for this basename: AMMONIA,  in the last 168 hours CBC:  Recent Labs Lab 09/27/14 0529 09/28/14 0533 09/29/14 0610 09/30/14 0520  WBC 6.8 7.2 6.6 7.4  HGB 7.3* 7.5* 8.3* 8.2*  HCT 22.1* 22.4* 25.3* 24.9*  MCV 96.9 95.7 99.6 98.4  PLT 199 192 189 192   Cardiac Enzymes: No results found for this basename: CKTOTAL, CKMB, CKMBINDEX, TROPONINI,  in the last 168 hours BNP (last 3 results)  Recent Labs  09/30/14 0520  PROBNP 8120.0*   CBG:  Recent Labs Lab 10/01/14 0616 10/01/14 1142 10/01/14 1752 10/01/14 2128 10/02/14 0604  GLUCAP 204* 137* 170* 184* 201*    No results found for this or any previous visit (from the past 240 hour(s)).   Studies: No results found.  Scheduled Meds: . amiodarone  400 mg Oral Daily  . calcitRIOL  0.25 mcg Oral BID  . calcium-vitamin D  1 tablet Oral BID  . collagenase   Topical Daily  . febuxostat  80 mg Oral Daily  . furosemide  160 mg Intravenous BID  . insulin aspart  0-15 Units Subcutaneous TID WC  . insulin aspart  0-5 Units Subcutaneous QHS  . insulin aspart  2 Units Subcutaneous TID WC  . insulin glargine  22 Units Subcutaneous QHS  . iron polysaccharides  150 mg Oral BID  . levothyroxine  125 mcg Oral QAC breakfast  . linagliptin  5 mg Oral Daily  . metolazone  2.5 mg Oral QODAY  . multivitamin with minerals  1 tablet Oral Daily  . potassium chloride  20 mEq Oral Daily  . saccharomyces boulardii  250 mg  Oral BID  . sodium chloride  3 mL Intravenous Q12H  . 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   Obesity hypoventilation syndrome-    Cardiomyopathy, nonischemic- EF 30-35% June 2014   Type 2 diabetes mellitus   Chronic combined systolic and diastolic CHF   Long term (current) use of anticoagulants   CKD (chronic kidney disease) stage 4, GFR 15-29 ml/min   PAF- recurrent despite Amiodarone and multiple cardioversions   Mitral insufficiency, moderate to severe   Chronic massive bilat LE lymphadema   Fall at home- 3 falls this  summer, sound orthostatic   Hypotension   Acute on chronic renal failure   Coarse tremors    Time spent: > 35 minutes    Velvet Bathe  [facility] Pager [phone] If 7PM-7AM, please contact night-coverage at [email or web address], password [facility] 10/02/2014, 10:07 AM  LOS: 23 days

## 2014-10-02 NOTE — Progress Notes (Signed)
ANTICOAGULATION CONSULT NOTE - Follow Up Consult  Pharmacy Consult for coumadin Indication: atrial fibrillation  Allergies  Allergen Reactions  . Ivp Dye [Iodinated Diagnostic Agents] Shortness Of Breath    Turn red, can't breathe  . Betadine [Povidone Iodine]   . Diphenhydramine Hcl Swelling    In hands and eyes  . Fish Allergy Nausea And Vomiting  . Iohexol      Desc: PT TURNS RED AND WHEEZING   . Penicillins Other (See Comments)    Resp arrest as child. Tolerates cephalosporins.  . Povidone-Iodine Other (See Comments)    Wheezing, turn red, can't breath, and blisters  . Promethazine Hcl Other (See Comments)    Low Blood Pressure  . Tape     Blisters, can use paper tape for short periods  . Clindamycin Hives and Rash    wheezing  . Iodine Rash  . Morphine Sulfate Nausea And Vomiting and Rash  . Primaxin [Imipenem] Rash    Patient Measurements: Height: 5' 6.93" (170 cm) Weight:  (bed not zero) IBW/kg (Calculated) : 61.44  Vital Signs: Temp: 98 F (36.7 C) (10/03 0521) Temp Source: Oral (10/03 0521) BP: 85/36 mmHg (10/03 0521) Pulse Rate: 95 (10/03 0521)  Labs:  Recent Labs  09/30/14 0520 10/01/14 0608 10/02/14 0445  HGB 8.2*  --   --   HCT 24.9*  --   --   PLT 192  --   --   LABPROT 20.0* 21.2* 23.6*  INR 1.70* 1.83* 2.10*  CREATININE 2.88* 2.77* 2.75*    Estimated Creatinine Clearance: 29 ml/min (by C-G formula based on Cr of 2.75).  Assessment: Patient is a 64 y.o F on coumadin for Afib. INR is therapeutic today at 2.10. No bleeding documented.  Goal of Therapy:  INR 2-3    Plan:  1) coumadin 5mg  PO x1 today  [name] P 10/02/2014,12:00 PM

## 2014-10-02 NOTE — Progress Notes (Signed)
Patient got upset because the tech and the nurse came in and wanted to check her sacrum to make sure she did not have bowel movement and to weigh her as ordered. It took a lot of talking and convincing before patient allowed staff to turn her. 'l don't want to be bothered ,l am tired, patient said'.Patient does not know when she has bowel movements. Nurse explained to patient that in order to control her skin we have to keep her skin from laying on stool without perineal care cleaning.

## 2014-10-03 LAB — GLUCOSE, CAPILLARY
GLUCOSE-CAPILLARY: 187 mg/dL — AB (ref 70–99)
GLUCOSE-CAPILLARY: 164 mg/dL — AB (ref 70–99)
Glucose-Capillary: 175 mg/dL — ABNORMAL HIGH (ref 70–99)
Glucose-Capillary: 193 mg/dL — ABNORMAL HIGH (ref 70–99)

## 2014-10-03 LAB — RENAL FUNCTION PANEL
ALBUMIN: 1.3 g/dL — AB (ref 3.5–5.2)
Anion gap: 11 (ref 5–15)
BUN: 104 mg/dL — ABNORMAL HIGH (ref 6–23)
CO2: 28 mEq/L (ref 19–32)
Calcium: 7.1 mg/dL — ABNORMAL LOW (ref 8.4–10.5)
Chloride: 96 mEq/L (ref 96–112)
Creatinine, Ser: 2.7 mg/dL — ABNORMAL HIGH (ref 0.50–1.10)
GFR calc Af Amer: 20 mL/min — ABNORMAL LOW (ref >90)
GFR calc non Af Amer: 17 mL/min — ABNORMAL LOW (ref >90)
GLUCOSE: 162 mg/dL — AB (ref 70–99)
PHOSPHORUS: 5 mg/dL — AB (ref 2.3–4.6)
Potassium: 3.7 mEq/L (ref 3.7–5.3)
SODIUM: 135 meq/L — AB (ref 137–147)

## 2014-10-03 LAB — PROTIME-INR
INR: 2.44 — ABNORMAL HIGH (ref 0.00–1.49)
Prothrombin Time: 26.5 seconds — ABNORMAL HIGH (ref 11.6–15.2)

## 2014-10-03 MED ORDER — WARFARIN SODIUM 5 MG PO TABS
5.0000 mg | ORAL_TABLET | Freq: Once | ORAL | Status: AC
  Administered 2014-10-03: 5 mg via ORAL
  Filled 2014-10-03: qty 1

## 2014-10-03 NOTE — Progress Notes (Signed)
ANTICOAGULATION CONSULT NOTE - Follow Up Consult  Pharmacy Consult for coumadin Indication: atrial fibrillation  Allergies  Allergen Reactions  . Ivp Dye [Iodinated Diagnostic Agents] Shortness Of Breath    Turn red, can't breathe  . Betadine [Povidone Iodine]   . Diphenhydramine Hcl Swelling    In hands and eyes  . Fish Allergy Nausea And Vomiting  . Iohexol      Desc: PT TURNS RED AND WHEEZING   . Penicillins Other (See Comments)    Resp arrest as child. Tolerates cephalosporins.  . Povidone-Iodine Other (See Comments)    Wheezing, turn red, can't breath, and blisters  . Promethazine Hcl Other (See Comments)    Low Blood Pressure  . Tape     Blisters, can use paper tape for short periods  . Clindamycin Hives and Rash    wheezing  . Iodine Rash  . Morphine Sulfate Nausea And Vomiting and Rash  . Primaxin [Imipenem] Rash    Patient Measurements: Height: 5' 6.93" (170 cm) Weight:  (bed not zero) IBW/kg (Calculated) : 61.44  Vital Signs: Temp: 98.7 F (37.1 C) (10/04 0549) Temp Source: Oral (10/04 0549) BP: 87/45 mmHg (10/04 0549) Pulse Rate: 97 (10/04 0549)  Labs:  Recent Labs  10/01/14 0608 10/02/14 0445 10/03/14 0530 10/03/14 0845  LABPROT 21.2* 23.6*  --  26.5*  INR 1.83* 2.10*  --  2.44*  CREATININE 2.77* 2.75* 2.70*  --     Estimated Creatinine Clearance: 29.6 ml/min (by C-G formula based on Cr of 2.7).  Assessment: Patient is a 64 y.o F on coumadin for Afib. INR remains therapeutic today.  No bleeding documented.  Goal of Therapy:  INR 2-3    Plan:  1) coumadin 5mg  PO x1 today (home dose)  Jerris Keltz P 10/03/2014,11:32 AM

## 2014-10-03 NOTE — Progress Notes (Signed)
Patient refused to have her qshift dressings done. Patient originally stated that they were just done. Then stated that they are only once a day. I told her they are for qshift and then she stated that she hurts too bad and does not want them done. I offered to do them again and informed her of the risk of refusing. Patient still refused.

## 2014-10-03 NOTE — Progress Notes (Signed)
[facility] PROGRESS NOTE  DIVINE IMBER NKN:[id number] DOB: 18-Mar-1950 DOA: 09/09/2014 PCP: Elby Showers, MD   Assessment/Plan:   Septic shock due to Left lower leg cellulitis:  - Initially on Vanc/CIpro/FLagyl from 9/11  - changed to IV cefazolin from 9/14  - blood culture 1/2 group B strep, completed 14 days of antibiotics, between Vanc and ancef  - Ancef stopped 9/24  - Wound care consult placed.  General surgery on board and assisting with case.  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- EF 30-35% June 2014  -remains volume overload, anasarca, third spacing due to severe hypoalbuminemia and CKD 3-4  -was being diuresed with IV lasix, changed to Po demadex and metolazone per Cards over weekend  -creatinine trending up, baseline 2, negative 18.3L, but weight still trending up despite being net negative everyday  -no ACE due to AKI  -baseline dry weight unclear around 330lbs -Renal on board and now managing IV lasix dose   Hypotension - Suspect blood pressure readings not accurate as patient has had reported systolic blood pressure is a 60 but has been able to carry a full conversation with staff. - Due to patient's body habitus I suspect patient's blood pressure recordings are inaccurate. - Cardiology and nephrology assisting with diuresis, patient refuses A line. Discussed A line again with patient who dose not wish to have this placed.  RUE swelling  -dopplers 9/22 negative, due to third spacing   Type 2 diabetes mellitus:  - A1c 7.0  - lantus increased to 22 units.  - SSI, blood sugars relatively controlled on this regimen  . PAF (paroxysmal atrial fibrillation)  - Currently with a paced rhythm  - Coumadin dosed by pharmacy -  continue amiodarone   . Hypothyroidism, acquired, autoimmune: Recent TSH was 2.12 on 05/28/14.  - Continue levothyroxine   Gout: stable  -Continue Uloric   . AKI on CKD (chronic kidney disease) stage 4, GFR  15-29 ml/min: ccl slightly higher from baseline 2.5-3.00 to 3.38 on admission  - Currently at baseline  -Followed by Dr.Dunham, Renal consulted given overloaded state with CKD, CHF and Hypoalbuminemia  -now on high dose of IV lasix and metolazone   . PACEMAKER, PERMANENT  . Obesity hypoventilation syndrome  - CPAP QHS   . OBESITY, MORBID  Tremors/shakes  -gabapentin stopped, monitor  -improving   Anemia:  -due to CKD and acute illness and iron deficiency -baseline around 9, transfused 2units PRBC 9/22  -Anemia panel c/w Chronic disease, transfuse if Hb <7   Code Status: full  Family Communication: patient  Disposition Plan: Will attempt to place in LTAC as recommended by PT and general surgical group  Consultants:  PCCM  Cards Nephrology   Procedures:  Central venous catheter insertion procedure on 09/10/2049    HPI/Subjective: NO new complaints. Pt reports that she is tired today  Objective: Filed Vitals:   10/03/14 0549  BP: 87/45  Pulse: 97  Temp: 98.7 F (37.1 C)  Resp: 18    Intake/Output Summary (Last 24 hours) at 10/03/14 1429 Last data filed at 10/03/14 0612  Gross per 24 hour  Intake      0 ml  Output   1675 ml  Net  -1675 ml   Filed Weights   09/30/14 0645 10/01/14 0526  Weight: 188.696 kg (416 lb) 136.533 kg (301 lb)    Exam:   General:  Patient in no acute distress, alert and awake  Cardiovascular: S1 and S2 present  Respiratory:  No audible wheezes, equal chest rise  Abdomen: Nontender, nondistended  Neuro: answers questions appropriately and moves extremities equally  Data Reviewed: Basic Metabolic Panel:  Recent Labs Lab 09/28/14 0533 09/29/14 0610 09/30/14 0520 10/01/14 0608 10/02/14 0445 10/03/14 0530  NA 130* 131* 131* 130* 131* 135*  K 4.0 3.8 3.9 3.8 3.6* 3.7  CL 92* 92* 91* 91* 93* 96  CO2 24 25 24 25 27 28   GLUCOSE 200* 160* 148* 165* 169* 162*  BUN 95* 95* 100* 102* 105* 104*  CREATININE 2.95* 2.91* 2.88*  2.77* 2.75* 2.70*  CALCIUM 7.5* 7.4* 7.3* 7.0* 7.0* 7.1*  MG 1.9 1.9 1.9 1.9 2.0  --   PHOS 5.6* 5.5* 5.5* 5.1* 5.0* 5.0*   Liver Function Tests:  Recent Labs Lab 09/29/14 0610 09/30/14 0520 10/01/14 0608 10/02/14 0445 10/03/14 0530  ALBUMIN 1.4* 1.3* 1.3* 1.3* 1.3*   No results found for this basename: LIPASE, AMYLASE,  in the last 168 hours No results found for this basename: AMMONIA,  in the last 168 hours CBC:  Recent Labs Lab 09/27/14 0529 09/28/14 0533 09/29/14 0610 09/30/14 0520  WBC 6.8 7.2 6.6 7.4  HGB 7.3* 7.5* 8.3* 8.2*  HCT 22.1* 22.4* 25.3* 24.9*  MCV 96.9 95.7 99.6 98.4  PLT 199 192 189 192   Cardiac Enzymes: No results found for this basename: CKTOTAL, CKMB, CKMBINDEX, TROPONINI,  in the last 168 hours BNP (last 3 results)  Recent Labs  09/30/14 0520  PROBNP 8120.0*   CBG:  Recent Labs Lab 10/02/14 1059 10/02/14 1621 10/02/14 2219 10/03/14 0553 10/03/14 1111  GLUCAP 170* 242* 200* 175* 187*    No results found for this or any previous visit (from the past 240 hour(s)).   Studies: No results found.  Scheduled Meds: . amiodarone  400 mg Oral Daily  . calcitRIOL  0.25 mcg Oral BID  . calcium-vitamin D  1 tablet Oral BID  . collagenase   Topical Daily  . febuxostat  80 mg Oral Daily  . furosemide  160 mg Intravenous BID  . insulin aspart  0-15 Units Subcutaneous TID WC  . insulin aspart  0-5 Units Subcutaneous QHS  . insulin aspart  2 Units Subcutaneous TID WC  . insulin glargine  22 Units Subcutaneous QHS  . iron polysaccharides  150 mg Oral BID  . levothyroxine  125 mcg Oral QAC breakfast  . linagliptin  5 mg Oral Daily  . metolazone  2.5 mg Oral QODAY  . multivitamin with minerals  1 tablet Oral Daily  . potassium chloride  20 mEq Oral Daily  . saccharomyces boulardii  250 mg Oral BID  . sodium chloride  3 mL Intravenous Q12H  . warfarin  5 mg Oral ONCE-1800  . 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   Obesity hypoventilation syndrome-    Cardiomyopathy, nonischemic- EF 30-35% June 2014   Type 2 diabetes mellitus   Chronic combined systolic and diastolic CHF   Long term (current) use of anticoagulants   CKD (chronic kidney disease) stage 4, GFR 15-29 ml/min   PAF- recurrent despite Amiodarone and multiple cardioversions   Mitral insufficiency, moderate to severe   Chronic massive bilat LE lymphadema   Fall at home- 3 falls this summer, sound orthostatic  Hypotension   Acute on chronic renal failure   Coarse tremors    Time spent: > 35 minutes    Velvet Bathe  [facility] Pager [phone] If 7PM-7AM, please contact night-coverage at [email or web address], password [facility] 10/03/2014, 2:29 PM  LOS: 24 days

## 2014-10-03 NOTE — Progress Notes (Addendum)
Patient Profile: [name] is an 64 y.o. female with past medical history of NICM with ICD, chronic systolic CHF (EF 52-84% on 09/12/14), solitary right kidney with CKD Stage IV, hypothyroidism on replacement, pulmonary hypertension, atrial PAF on coumadin, morbid obesity, fibromyalgia, insulin-dependent Type II DM, and OSA admitted and treated for septic shock due to left lower leg cellulitis.   We were consulted for in regards to management of volume overload in the setting of chronic CHF and CKD Stage IV with hypoalbuminemia.   Subjective: No major complaints.  Was refusing dressing change.  Objective: Vital signs in last 24 hours: Temp:  [98.3 F (36.8 C)-98.9 F (37.2 C)] 98.7 F (37.1 C) (10/04 0549) Pulse Rate:  [97-99] 97 (10/04 0549) Resp:  [18-20] 18 (10/04 0549) BP: (81-91)/(45-46) 87/45 mmHg (10/04 0549) SpO2:  [90 %-92 %] 90 % (10/04 0549) Last BM Date: 10/02/14  Intake/Output from previous day: 10/03 0701 - 10/04 0700 In: -  Out: 1675 [Urine:1675] Intake/Output this shift:    Medications Current Facility-Administered Medications  Medication Dose Route Frequency Provider Last Rate Last Dose  . albuterol (PROVENTIL) (2.5 MG/3ML) 0.083% nebulizer solution 2.5 mg  2.5 mg Nebulization Q6H PRN Ivor Costa, MD      . ALPRAZolam Duanne Moron) tablet 0.25 mg  0.25 mg Oral BID PRN Hosie Poisson, MD   0.25 mg at 09/29/14 1608  . amiodarone (PACERONE) tablet 400 mg  400 mg Oral Daily Leonie Man, MD   400 mg at 10/02/14 1022  . calcitRIOL (ROCALTROL) capsule 0.25 mcg  0.25 mcg Oral BID Ivor Costa, MD   0.25 mcg at 10/02/14 2230  . calcium carbonate (TUMS - dosed in mg elemental calcium) chewable tablet 200 mg of elemental calcium  1 tablet Oral BID PRN Geradine Girt, DO   200 mg of elemental calcium at 09/14/14 1603  . calcium-vitamin D (OSCAL WITH D) 500-200 MG-UNIT per tablet 1 tablet  1 tablet Oral BID Ivor Costa, MD   1 tablet at 10/02/14 1022  . collagenase (SANTYL)  ointment   Topical Daily Velvet Bathe, MD      . febuxostat (ULORIC) tablet 80 mg  80 mg Oral Daily Ivor Costa, MD   80 mg at 10/02/14 1023  . furosemide (LASIX) 160 mg in dextrose 5 % 50 mL IVPB  160 mg Intravenous BID Juluis Mire, MD   160 mg at 10/02/14 1734  . HYDROcodone-acetaminophen (NORCO) 10-325 MG per tablet 1 tablet  1 tablet Oral Q4H PRN Hosie Poisson, MD   1 tablet at 10/03/14 0514  . hydrOXYzine (ATARAX/VISTARIL) tablet 25 mg  25 mg Oral Q4H PRN Ivor Costa, MD   25 mg at 09/18/14 2219  . insulin aspart (novoLOG) injection 0-15 Units  0-15 Units Subcutaneous TID WC Geradine Girt, DO   5 Units at 10/02/14 1624  . insulin aspart (novoLOG) injection 0-5 Units  0-5 Units Subcutaneous QHS Geradine Girt, DO   2 Units at 09/30/14 2336  . insulin aspart (novoLOG) injection 2 Units  2 Units Subcutaneous TID WC Hosie Poisson, MD   2 Units at 10/02/14 1624  . insulin glargine (LANTUS) injection 22 Units  22 Units Subcutaneous QHS Hosie Poisson, MD   22 Units at 10/02/14 2231  . iron polysaccharides (NIFEREX) capsule 150 mg  150 mg Oral BID Ivor Costa, MD   150 mg at 10/02/14 2229  . levothyroxine (SYNTHROID, LEVOTHROID) tablet 125 mcg  125 mcg Oral QAC breakfast Ivor Costa, MD  125 mcg at 10/03/14 0759  . linagliptin (TRADJENTA) tablet 5 mg  5 mg Oral Daily Wilhelmina Mcardle, MD   5 mg at 10/02/14 1025  . metolazone (ZAROXOLYN) tablet 2.5 mg  2.5 mg Oral Acquanetta Belling, MD   2.5 mg at 10/02/14 0813  . multivitamin with minerals tablet 1 tablet  1 tablet Oral Daily Ivor Costa, MD   1 tablet at 09/28/14 1136  . nitroGLYCERIN (NITROSTAT) SL tablet 0.4 mg  0.4 mg Sublingual Q5 min PRN Velvet Bathe, MD      . potassium chloride (K-DUR) CR tablet 20 mEq  20 mEq Oral Daily Rhonda G Barrett, PA-C   20 mEq at 10/02/14 1022  . saccharomyces boulardii (FLORASTOR) capsule 250 mg  250 mg Oral BID Ivor Costa, MD   250 mg at 10/02/14 2231  . sodium chloride 0.9 % bolus 1,000 mL  1,000 mL Intravenous PRN Erick Colace, NP      . sodium chloride 0.9 % bolus 1,000 mL  1,000 mL Intravenous PRN Erick Colace, NP      . sodium chloride 0.9 % injection 10-40 mL  10-40 mL Intracatheter PRN Raylene Miyamoto, MD   10 mL at 10/01/14 1733  . sodium chloride 0.9 % injection 3 mL  3 mL Intravenous Q12H Ivor Costa, MD   3 mL at 09/30/14 1007  . Warfarin - Pharmacist Dosing Inpatient   Does not apply q1800 Saundra Shelling, [facility]        PE: General appearance: alert, cooperative, no distress and morbidly obese Lungs: clear to auscultation bilaterally Heart: irregularly irregular rhythm Extremities: 3+ bilateral LEE Pulses: 2+ and symmetric Skin: warm and dry Neurologic: Grossly normal  Lab Results:  No results found for this basename: WBC, HGB, HCT, PLT,  in the last 72 hours BMET  Recent Labs  10/01/14 0608 10/02/14 0445 10/03/14 0530  NA 130* 131* 135*  K 3.8 3.6* 3.7  CL 91* 93* 96  CO2 25 27 28   GLUCOSE 165* 169* 162*  BUN 102* 105* 104*  CREATININE 2.77* 2.75* 2.70*  CALCIUM 7.0* 7.0* 7.1*   PT/INR  Recent Labs  10/01/14 0608 10/02/14 0445 10/03/14 0845  LABPROT 21.2* 23.6* 26.5*  INR 1.83* 2.10* 2.44*   Assessment/Plan   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 Acute on Chronic Systolic HF: Nephrology directed diuresis. Currently on Lasix 160 mg IV  twice a day as well as metolazone 2.5 mg once a day. Weight 301 from 350. Continue current plan.   2. Persistent A-fib: rate is controlled. Continue amiodarone. Continue warfarin.   3. Anticoagulation: Being followed by pharmacy. She needs to be therapeutic not only for stroke prophylaxis but also for DVT prophylaxis as she has been very sedentary this admission.  4. CKD: nephrology following.  5. Hypotension: She refuses an arterial line. Likely spurious at times. Given her arm size, blood pressure cuff readings are difficult to determine. Obviously, she is mentating clearly and these are likely falsely low readings. I would continue with current management. She is frustrated.     LOS: 24 days    Candee Furbish, MD   10/03/2014 10:41 AM

## 2014-10-04 LAB — GLUCOSE, CAPILLARY
GLUCOSE-CAPILLARY: 178 mg/dL — AB (ref 70–99)
GLUCOSE-CAPILLARY: 211 mg/dL — AB (ref 70–99)
Glucose-Capillary: 173 mg/dL — ABNORMAL HIGH (ref 70–99)
Glucose-Capillary: 203 mg/dL — ABNORMAL HIGH (ref 70–99)

## 2014-10-04 LAB — PROTIME-INR
INR: 2.76 — ABNORMAL HIGH (ref 0.00–1.49)
PROTHROMBIN TIME: 29.2 s — AB (ref 11.6–15.2)

## 2014-10-04 LAB — RENAL FUNCTION PANEL
ALBUMIN: 1.3 g/dL — AB (ref 3.5–5.2)
ANION GAP: 11 (ref 5–15)
BUN: 100 mg/dL — ABNORMAL HIGH (ref 6–23)
CHLORIDE: 93 meq/L — AB (ref 96–112)
CO2: 28 mEq/L (ref 19–32)
Calcium: 7.2 mg/dL — ABNORMAL LOW (ref 8.4–10.5)
Creatinine, Ser: 2.55 mg/dL — ABNORMAL HIGH (ref 0.50–1.10)
GFR, EST AFRICAN AMERICAN: 21 mL/min — AB (ref >90)
GFR, EST NON AFRICAN AMERICAN: 19 mL/min — AB (ref >90)
Glucose, Bld: 180 mg/dL — ABNORMAL HIGH (ref 70–99)
PHOSPHORUS: 4.8 mg/dL — AB (ref 2.3–4.6)
POTASSIUM: 3.3 meq/L — AB (ref 3.7–5.3)
SODIUM: 132 meq/L — AB (ref 137–147)

## 2014-10-04 LAB — MAGNESIUM: MAGNESIUM: 2 mg/dL (ref 1.5–2.5)

## 2014-10-04 MED ORDER — POTASSIUM CHLORIDE CRYS ER 20 MEQ PO TBCR
40.0000 meq | EXTENDED_RELEASE_TABLET | Freq: Once | ORAL | Status: DC
  Filled 2014-10-04: qty 2

## 2014-10-04 MED ORDER — WARFARIN SODIUM 2.5 MG PO TABS
2.5000 mg | ORAL_TABLET | Freq: Once | ORAL | Status: AC
  Administered 2014-10-04: 2.5 mg via ORAL
  Filled 2014-10-04: qty 1

## 2014-10-04 MED ORDER — POTASSIUM CHLORIDE ER 10 MEQ PO TBCR
40.0000 meq | EXTENDED_RELEASE_TABLET | Freq: Once | ORAL | Status: AC
  Administered 2014-10-04: 40 meq via ORAL
  Filled 2014-10-04: qty 4

## 2014-10-04 NOTE — Progress Notes (Addendum)
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 But feeling better overall. No CP or SOB. Swelling better.   CURRENT MEDS . amiodarone  400 mg Oral Daily  . calcitRIOL  0.25 mcg Oral BID  . calcium-vitamin D  1 tablet Oral BID  . collagenase   Topical Daily  . febuxostat  80 mg Oral Daily  . furosemide  160 mg Intravenous BID  . insulin aspart  0-15 Units Subcutaneous TID WC  . insulin aspart  0-5 Units Subcutaneous QHS  . insulin aspart  2 Units Subcutaneous TID WC  . insulin glargine  22 Units Subcutaneous QHS  . iron polysaccharides  150 mg Oral BID  . levothyroxine  125 mcg Oral QAC breakfast  . linagliptin  5 mg Oral Daily  . metolazone  2.5 mg Oral QODAY  . multivitamin with minerals  1 tablet Oral Daily  . potassium chloride  20 mEq Oral Daily  . saccharomyces boulardii  250 mg Oral BID  . sodium chloride  3 mL Intravenous Q12H  . Warfarin - Pharmacist Dosing Inpatient   Does not apply q1800    OBJECTIVE  Filed Vitals:   10/03/14 0549 10/03/14 1502 10/03/14 2110 10/04/14 0625  BP: 87/45 94/58 89/50  103/49  Pulse: 97 103 95 79  Temp: 98.7 F (37.1 C) 98.1 F (36.7 C) 98.1 F (36.7 C) 97.6 F (36.4 C)  TempSrc:  Oral Oral Oral Oral  Resp: 18 17 18 18   Height:      Weight:      SpO2: 90% 92% 91% 96%    Intake/Output Summary (Last 24 hours) at 10/04/14 0647 Last data filed at 10/04/14 3086  Gross per 24 hour  Intake    480 ml  Output   1400 ml  Net   -920 ml   Filed Weights   10/01/14 0526  Weight: 301 lb (136.533 kg)    PHYSICAL EXAM  General appearance: alert, cooperative, no distress and morbidly obese  Lungs: clear to auscultation bilaterally  Heart: irregularly irregular rhythm  Extremities: 3+ bilateral LEE  Pulses: 2+ and symmetric  Skin: warm and dry  Neurologic: Grossly normal  Accessory Clinical Findings  CBC No results found for this basename: WBC, NEUTROABS, HGB, HCT, MCV, PLT,  in the last 72 hours Basic Metabolic Panel  Recent Labs  10/02/14 0445 10/03/14 0530 10/04/14 0400  NA 131* 135* 132*  K 3.6* 3.7 3.3*  CL 93* 96 93*  CO2 27 28 28   GLUCOSE 169* 162* 180*  BUN 105* 104* 100*  CREATININE 2.75* 2.70* 2.55*  CALCIUM 7.0* 7.1* 7.2*  MG 2.0  --  2.0  PHOS 5.0* 5.0* 4.8*   Liver Function Tests  Recent Labs  10/03/14 0530 10/04/14 0400  ALBUMIN 1.3* 1.3*    TELE  afib with paced beats. Well controlled  Radiology/Studies  Dg Chest Port 1 View  09/14/2014   CLINICAL DATA:  Airspace disease.  EXAM: PORTABLE CHEST - 1 VIEW  COMPARISON:  09/13/2014.  FINDINGS: Support apparatus: LEFT IJ central line unchanged with the tip in the upper to mid SVC. Pacemaker leads appear unchanged.  Cardiomediastinal Silhouette:  Enlarged, unchanged.  Lungs: Mild improvement in pulmonary edema. Pulmonary vascular congestion persists. Subsegmental atelectasis the LEFT mid lung. No focal consolidation. No pneumothorax.  Effusions:  None.  Other:  None.  IMPRESSION: 1. Stable support apparatus. 2. Unchanged cardiomegaly. 3. Improved central pulmonary edema with persistent pulmonary vascular congestion.   Electronically Signed   By: Dereck Ligas M.D.   On: 09/14/2014  08:10   Dg Chest Port 1 View  09/13/2014   CLINICAL DATA:  Assess edema  EXAM: PORTABLE CHEST - 1 VIEW  COMPARISON:  September 12, 2014  FINDINGS: The heart size and mediastinal contours are stable. The heart size is enlarged. Left central venous line is identified with distal tip in the superior vena cava. Cardiac pacemaker is unchanged. There is enlargement of central pulmonary vessels worse compared to prior exam. There is no focal pneumonia or pleural effusion. The visualized skeletal structures are stable.  IMPRESSION: Slight interval worsened central pulmonary vascular congestion. There is no focal pneumonia or pleural effusion. Cardiomegaly.   Electronically Signed   By: Abelardo Diesel M.D.   On: 09/13/2014 07:31   Dg Chest Port 1 View  09/12/2014   CLINICAL DATA:  Evaluate pulmonary edema.  EXAM: PORTABLE CHEST - 1 VIEW  COMPARISON:  Chest x-ray 09/10/2014.  FINDINGS: There is a left-sided internal jugular central venous catheter with tip terminating in the mid superior vena cava. Right-sided pacemaker device in position with lead tips projecting over the expected location of the right atrium and right ventricular apex. Lung volumes are normal. No consolidative airspace disease. No pleural effusions. Cephalization of the pulmonary vasculature, without frank pulmonary edema. Moderate cardiomegaly. The patient is rotated to the right on today's exam, resulting in distortion of the mediastinal contours and reduced diagnostic sensitivity and specificity for mediastinal pathology. Atherosclerosis in the thoracic aorta.  IMPRESSION: 1. Support apparatus, as above. 2. Cardiomegaly with pulmonary venous congestion, but no frank pulmonary edema. 3. Atherosclerosis.   Electronically Signed   By: Vinnie Langton M.D.   On: 09/12/2014 08:25   Dg Chest Port 1 View  09/10/2014   CLINICAL DATA:  Line placement  EXAM: PORTABLE CHEST - 1 VIEW  COMPARISON:  Portable exam 8416 hr compared to 06/03/2013  FINDINGS:  RIGHT subclavian pacemaker leads project over RIGHT atrium and RIGHT ventricle, unchanged.  New LEFT subclavian central venous catheter with tip projecting over mid to distal SVC.  Enlargement of cardiac silhouette with slight pulmonary vascular congestion.  No gross infiltrate, pleural effusion or pneumothorax.  Bones unremarkable.  IMPRESSION: No pneumothorax following central line placement.  Enlargement of cardiac silhouette with pulmonary vascular congestion.   Electronically Signed   By: Lavonia Dana M.D.   On: 09/10/2014 17:05   Dg Tibia/fibula Left Port  09/10/2014   CLINICAL DATA:  Edema and cellulitis  EXAM: PORTABLE LEFT TIBIA AND FIBULA - 2 VIEW  COMPARISON:  None.  FINDINGS: Frontal and lateral views were obtained. There is marked soft tissue swelling. No fracture or  dislocation. No erosive change or bony destruction. There is no evidence of soft tissue air.  IMPRESSION: Diffuse soft tissue edema/swelling. No soft tissue air is seen. No erosive change or bony destruction. No fracture or dislocation.  If there remains concern for potential soft tissue abscess for subtle air, CT would be the imaging study of choice to further assess.   Electronically Signed   By: Lowella Grip M.D.   On: 09/10/2014 17:07    ASSESSMENT AND PLAN PALOMA GRANGE is an 64 y.o. female with past medical history of NICM with ICD, chronic systolic CHF (EF 46-80% on 09/12/14), solitary right kidney with CKD Stage IV, hypothyroidism on replacement, pulmonary hypertension, atrial PAF on coumadin, morbid obesity, fibromyalgia, insulin-dependent Type II DM, and OSA admitted on 09/09/14 for septic shock due to left lower leg cellulitis. Cardiology was consulted for in regards to management of volume overload in the setting of chronic CHF and CKD Stage IV with hypoalbuminemia  Acute on Combined systolic and diastolic congestive heart failure- EF 30-35% June 2014  -- Remains volume overload, anasarca, third spacing due to  severe hypoalbuminemia and CKD 3-4  -- Nephrology directed diuresis. Currently on Lasix 160 mg IV twice a day as well as metolazone 2.5 mg once a day. -- Weight 301 from 350. Net neg 24L -- Creatinine around baseline. 2. 55 today. -- No ACE due to CKD -- Baseline dry weight unclear around 330lbs   AKI on CKD (chronic kidney disease) stage 4, GFR 15-29 ml/min: ccl slightly higher from baseline 2.5-3.00 to 3.38 on admission  - Currently at baseline  -Followed by Dr.Dunham, Renal consulted given overloaded state with CKD, CHF and Hypoalbuminemia  -now on high dose of IV lasix and metolazone   PAF (paroxysmal atrial fibrillation)  - Currently with a paced rhythm  - Coumadin dosed by pharmacy. INR 2.76 - continue amiodarone   Hypotension  -- Suspect blood pressure readings not accurate due to patient's body habitus. Obviously, she is mentating clearly and these are likely falsely low readings ( one reading with SBP in 60s). -- Patient refuses A line.   RUE swelling  -dopplers 9/22 negative, due to third spacing   Type 2 diabetes mellitus:  - A1c 7.0 per IM  Hypothyroidism Recent TSH was 2.12 on 05/28/14.  - Continue levothyroxine   PACEMAKER, PERMANENT   Obesity hypoventilation syndrome  - CPAP QHS   Hypokalemia- K 3.3. will give supplementation this AM with [address] PA-C  Pager [phone]  Patient seen, examined. Available data reviewed. Agree with findings, assessment, and plan as outlined by Lorretta Harp, PA-C. Exam reveals a pleasant, morbidly obese woman in no distress. Lung fields are clear. Heart is irregular without murmur. Extremities demonstrate marked anasarca. Agree with plan as outlined above with continued diuresis using a combination of metolazone and high-dose IV furosemide as directed by the nephrology team. Her INR is therapeutic for atrial fibrillation. Low blood pressures felt to be spurious, and I suspect this is the case. The  patient does not want an arterial line. As she is clinically improving, I agree with continuing her current management. Will continue to follow.  Sherren Mocha, M.D. 10/04/2014 10:51 AM

## 2014-10-04 NOTE — Progress Notes (Signed)
[facility] Progress Note     Subjective: Pt worn out.  Just had dressing changed.  Pain medicine working well, but she's worn out from PT.  Hydrotherapy pending around 1000.    Objective: Vital signs in last 24 hours: Temp:  [97.6 F (36.4 C)-98.1 F (36.7 C)] 97.6 F (36.4 C) (10/05 0625) Pulse Rate:  [79-103] 79 (10/05 0625) Resp:  [17-18] 18 (10/05 0625) BP: (89-103)/(49-58) 103/49 mmHg (10/05 0625) SpO2:  [91 %-96 %] 96 % (10/05 0625) Last BM Date: 10/03/14  Intake/Output from previous day: 10/04 0701 - 10/05 0700 In: 480 [P.O.:480] Out: 1400 [Urine:1400] Intake/Output this shift:    PE: Gen:  Alert, NAD, pleasant Skin:  Will evaluate during hydrotherapy  Lab Results:  No results found for this basename: WBC, HGB, HCT, PLT,  in the last 72 hours BMET  Recent Labs  10/03/14 0530 10/04/14 0400  NA 135* 132*  K 3.7 3.3*  CL 96 93*  CO2 28 28  GLUCOSE 162* 180*  BUN 104* 100*  CREATININE 2.70* 2.55*  CALCIUM 7.1* 7.2*   PT/INR  Recent Labs  10/03/14 0845 10/04/14 0400  LABPROT 26.5* 29.2*  INR 2.44* 2.76*   CMP     Component Value Date/Time   NA 132* 10/04/2014 0400   K 3.3* 10/04/2014 0400   CL 93* 10/04/2014 0400   CO2 28 10/04/2014 0400   GLUCOSE 180* 10/04/2014 0400   BUN 100* 10/04/2014 0400   CREATININE 2.55* 10/04/2014 0400   CREATININE 2.10* 05/28/2014 0001   CREATININE 1.28* 02/12/2011 1000   CALCIUM 7.2* 10/04/2014 0400   CALCIUM 9.5 03/24/2012 1212   PROT 6.4 09/22/2014 0500   ALBUMIN 1.3* 10/04/2014 0400   AST 22 09/22/2014 0500   ALT <5 09/22/2014 0500   ALKPHOS 194* 09/22/2014 0500   BILITOT 0.7 09/22/2014 0500   GFRNONAA 19* 10/04/2014 0400   GFRAA 21* 10/04/2014 0400   Lipase  No results found for this basename: lipase       Studies/Results: No results found.  Anti-infectives: Anti-infectives   Start     Dose/Rate Route Frequency Ordered Stop   09/19/14 2000  ceFAZolin (ANCEF) IVPB 2 g/50 mL premix     2 g 100  mL/hr over 30 Minutes Intravenous Every 8 hours 09/19/14 1307 09/22/14 2032   09/13/14 1200  ceFAZolin (ANCEF) IVPB 2 g/50 mL premix  Status:  Discontinued     2 g 100 mL/hr over 30 Minutes Intravenous Every 12 hours 09/13/14 1141 09/19/14 1307   09/13/14 1000  levofloxacin (LEVAQUIN) IVPB 500 mg  Status:  Discontinued     500 mg 100 mL/hr over 60 Minutes Intravenous Every 48 hours 09/11/14 0958 09/12/14 1640   09/11/14 2300  vancomycin (VANCOCIN) 1,750 mg in sodium chloride 0.9 % 500 mL IVPB  Status:  Discontinued     1,750 mg 250 mL/hr over 120 Minutes Intravenous Every 48 hours 09/09/14 2158 09/13/14 1139   09/11/14 1800  ciprofloxacin (CIPRO) IVPB 400 mg  Status:  Discontinued     400 mg 200 mL/hr over 60 Minutes Intravenous Every 24 hours 09/10/14 1723 09/11/14 0913   09/11/14 1030  levofloxacin (LEVAQUIN) IVPB 500 mg     500 mg 100 mL/hr over 60 Minutes Intravenous  Once 09/11/14 0958 09/11/14 1141   09/10/14 1645  ciprofloxacin (CIPRO) IVPB 400 mg     400 mg 200 mL/hr over 60 Minutes Intravenous STAT 09/10/14 1640 09/10/14 1844   09/09/14 2300  metroNIDAZOLE (FLAGYL)  IVPB 500 mg  Status:  Discontinued     500 mg 100 mL/hr over 60 Minutes Intravenous Every 8 hours 09/09/14 2124 09/11/14 0915   09/09/14 2200  vancomycin (VANCOCIN) 1,500 mg in sodium chloride 0.9 % 500 mL IVPB     1,500 mg 250 mL/hr over 120 Minutes Intravenous  Once 09/09/14 2158 09/10/14 0207   09/09/14 1800  vancomycin (VANCOCIN) IVPB 1000 mg/200 mL premix     1,000 mg 200 mL/hr over 60 Minutes Intravenous  Once 09/09/14 1754 09/09/14 2042       Assessment/Plan 1. 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 Continue BID dressing changes to these wounds. Use santyl daily.  2. Cont PT hydrotherapy.  3. Will recheck wounds when hydrotherapy comes today. If patient is medically stable and needs further wound care, would recommend LTAC for more hydrotherapy and wound care prior to dc  home.      LOS: 25 days    [name], [name] 10/04/2014, 8:00 AM Pager: [phone]

## 2014-10-04 NOTE — Progress Notes (Signed)
Pt refuses CPAP.  RT will continue to monitor.  °

## 2014-10-04 NOTE — Progress Notes (Signed)
Occupational Therapy Treatment Patient Details Name: [name] MRN: [id number] DOB: [birth year] Today's Date: 10/04/2014    History of present illness 64 y.o. female  with history of fibromyalgia, hypothyroidism, solitary kidney, hypothyroidism, diabetes, nonischemic cardiomyopathy with ejection fraction of 35%, atrial fibrillation who presented to the emergency department with left-sided lower leg pain. She was admitted on 9/10 w/ recurrent Lower extremity cellulitis. PCCM asked to see in consult on 9/11 for persistent hypotension and worsening renal failure.  She had a previous admission for LE cellulitis in 07/2014 followed by [facility].  Pt had returned home with HHPT prior to this admission.    OT comments  Pt. Initially reluctant to participation in skilled OT secondary to sister reporting that "after hydro it's just too much".  Reviewed therapeutic options at length, and both agreeable to proceed with "in bed" options.  Pt. Able to assist with components of bed mobility and completed B UE exercises for continued strengthening.  Does better with self guided movements, requires increased time for task completion but doing well.  Agree with LTACH recommendations.  Pt. Is motivated and will benefit from continued skilled therapies prior to d/c home when able.    Follow Up Recommendations  LTACH;SNF                Precautions / Restrictions Precautions Precautions: Fall       Mobility Bed Mobility               General bed mobility comments: pt. participated in pre-bed mobility components in preparation for increased independence with bed mobility.  able to use b ues and pull on hob with bed in trendelenburg to bring positioning up in bed.  also completed 10 reps of reaching across bed to each bed rail to promote and initiate rolling.  pt. allowed therapist to position bed in seated position for upright posture and to promote sitting  Transfers                                                        ADL Overall ADL's : Needs assistance/impaired             Lower Body Bathing: Maximal assistance;Sitting/lateral leans                         General ADL Comments: pt. able to assist with placing clothes between thighs, this task simulated peri care.  with pt. in seated position she would be able to reach front peri areas for washing                                                                                  Exercises General Exercises - Upper Extremity Shoulder Flexion: Both;10 reps;Supine;Seated;Other (comment) (pt. has 1lb. hand held weight from home) Bar Weights/Barbell (Shoulder Flexion): 1 lb Shoulder Extension: Both;10 reps;Supine;Seated Bar Weights/Barbell (Shoulder Abduction): 1 lb Elbow Flexion: Both;10 reps;Supine;Seated Bar Weights/Barbell (Elbow Flexion): 1 lb Elbow Extension: Both;10 reps;Supine;Seated Bar Weights/Barbell (Elbow  Extension): 1 lb          General Comments  pts. Sister present and reports she is "her sister's advocate".  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  Session proceeds best if she is also included and informed of each movement and plan for the pt.    Pertinent Vitals/ Pain       Pain Assessment: No/denies pain                                                          Frequency Min 2X/week     Progress Toward Goals  OT Goals(current goals can now be found in the care plan section)  Progress towards OT goals: Progressing toward goals     Plan Discharge plan remains appropriate                     End of Session     Activity Tolerance Patient tolerated treatment well   Patient Left in bed;with call bell/phone within reach;with family/visitor present   Nurse Communication  nursing student and instructor present during bed mobility and use of remote to assist  pt. With upright position        Time: 1230-1333 OT Time Calculation (min): 63 min  Charges: OT General Charges $OT Visit: 1 Procedure OT Treatments $Self Care/Home Management : 38-52 mins $Therapeutic Exercise: 8-22 mins  Janice Coffin, COTA/L 10/04/2014, 2:17 PM

## 2014-10-04 NOTE — Progress Notes (Signed)
ANTICOAGULATION CONSULT NOTE - Follow Up Consult  Pharmacy Consult for coumadin Indication: atrial fibrillation  Allergies  Allergen Reactions  . Ivp Dye [Iodinated Diagnostic Agents] Shortness Of Breath    Turn red, can't breathe  . Betadine [Povidone Iodine]   . Diphenhydramine Hcl Swelling    In hands and eyes  . Fish Allergy Nausea And Vomiting  . Iohexol      Desc: PT TURNS RED AND WHEEZING   . Penicillins Other (See Comments)    Resp arrest as child. Tolerates cephalosporins.  . Povidone-Iodine Other (See Comments)    Wheezing, turn red, can't breath, and blisters  . Promethazine Hcl Other (See Comments)    Low Blood Pressure  . Tape     Blisters, can use paper tape for short periods  . Clindamycin Hives and Rash    wheezing  . Iodine Rash  . Morphine Sulfate Nausea And Vomiting and Rash  . Primaxin [Imipenem] Rash    Patient Measurements: Height: 5' 6.93" (170 cm) Weight:  (Bed weight not correct) IBW/kg (Calculated) : 61.44  Vital Signs: Temp: 97.6 F (36.4 C) (10/05 0625) Temp Source: Oral (10/05 0625) BP: 103/49 mmHg (10/05 0625) Pulse Rate: 79 (10/05 0625)  Labs:  Recent Labs  10/02/14 0445 10/03/14 0530 10/03/14 0845 10/04/14 0400  LABPROT 23.6*  --  26.5* 29.2*  INR 2.10*  --  2.44* 2.76*  CREATININE 2.75* 2.70*  --  2.55*    Estimated Creatinine Clearance: 31.3 ml/min (by C-G formula based on Cr of 2.55).  Assessment: Patient is a 64 y.o F on coumadin for Afib. INR remains therapeutic today at 2.76. No bleeding documented. Amiodarone PTA, now dose decreased to 400 mg daily.  Home dose: 5 mg on Sunday, Tuesday, Thursday and Saturday and 2.5 mg all other days.  Goal of Therapy:  INR 2-3   Plan:  1) coumadin 2.5mg  PO x1 today (home dose)  Maryanna Shape, PharmD, BCPS  Clinical Pharmacist  Pager: [phone]   10/04/2014,10:18 AM

## 2014-10-04 NOTE — Progress Notes (Signed)
Physical Therapy Wound Treatment Patient Details  Name: [name] MRN: [id number] Date of Birth: 02/26/1950  Today's Date: 10/04/2014 Time: 0762-2633 Time Calculation (min): 74 min  Subjective  Subjective: Pt reports her skin blisters with all dressings/adhesive except tegaderm (including pink foam) Patient and Family Stated Goals: decr pain in wounds and heal without surgery Date of Onset:  (multiple areas exact date unknown) Prior Treatments: mepitel foam, various types of tape  Pain Score: Pain Score: 9/10 when debriding Lt posteriomedial thigh; pre-medicated for pain, repositioned  Wound Assessment  Clinical Statement: Wounds continue to have underlying yellow necrotic tissue as surface layer removed. Lt posterior thigh periwound especially more erythematous (?due to moisture vs infectious?). Continues to be difficult to access Lt posterior/medical thigh for debridement. [address] [place], [place] [phone] for surgery prior to initiating treatment with no return page. RN notified. Will attempt to coordinate with surgery 10/05/14).     Pressure Ulcer 09/20/14 Deep Tissue Injury - Purple or maroon localized area of discolored intact skin or blood-filled blister due to damage of underlying soft tissue from pressure and/or shear. (Active)  Dressing Type Barrier Film (skin prep);Gauze (Comment);Transparent dressing 10/04/2014 10:04 AM  Dressing Changed 10/04/2014 10:04 AM  Dressing Change Frequency Twice a day 10/04/2014 10:04 AM  State of Healing Eschar 10/04/2014 10:04 AM  Site / Wound Assessment Yellow;Granulation tissue;Painful 10/04/2014 10:04 AM  % Wound base Red or Granulating 5% 10/04/2014 10:04 AM  % Wound base Yellow 95% 10/04/2014 10:04 AM  % Wound base Black 0% 10/04/2014 10:04 AM  % Wound base Other (Comment) 0% 10/04/2014 10:04 AM  Peri-wound Assessment Pink 10/04/2014 10:04 AM  Wound Length (cm) 3.8 cm 09/30/2014 10:24 AM  Wound Width (cm) 5.7 cm 09/30/2014 10:24 AM  Margins  Unattached edges (unapproximated) 10/04/2014 10:04 AM  Drainage Amount Moderate 10/04/2014 10:04 AM  Drainage Description Serous 10/04/2014 10:04 AM  Treatment Debridement (Selective);Hydrotherapy (Pulse lavage) 10/04/2014 10:04 AM     Wound / Incision (Open or Dehisced) 09/10/14 Other (Comment) Thigh Medial;Left;Posterior;Proximal (Active)  Dressing Type Gauze (Comment);Barrier Film (skin prep);Transparent dressing 10/04/2014 10:04 AM  Dressing Changed Changed 10/04/2014 10:04 AM  Dressing Status Clean;Dry;Intact 10/04/2014 10:04 AM  Dressing Change Frequency Twice a day 10/04/2014 10:04 AM  Site / Wound Assessment Black;Yellow;Pink;Granulation tissue;Painful 10/04/2014 10:04 AM  % Wound base Red or Granulating 10% 10/04/2014 10:04 AM  % Wound base Yellow 65% 10/04/2014 10:04 AM  % Wound base Black 25% 10/04/2014 10:04 AM  Peri-wound Assessment Denuded;Edema;Erythema (non-blanchable);Induration;Maceration;Pink 10/04/2014 10:04 AM  Wound Length (cm) 9.1 cm 09/30/2014 10:24 AM  Wound Width (cm) 7.3 cm 09/30/2014 10:24 AM  Margins Unattached edges (unapproximated) 10/04/2014 10:04 AM  Closure None 10/04/2014 10:04 AM  Drainage Amount Copious 10/04/2014 10:04 AM  Drainage Description Serous 10/04/2014 10:04 AM  Non-staged Wound Description Full thickness 10/04/2014 10:04 AM  Treatment Debridement (Selective);Hydrotherapy (Pulse lavage) 10/04/2014 10:04 AM     Wound / Incision (Open or Dehisced) 09/18/14 Other (Comment) Hip Right Black eschar tissue surrounded by pink tissue (Active)  Dressing Type Gauze (Comment);Barrier Film (skin prep);Transparent dressing 10/04/2014 10:04 AM  Dressing Changed Changed 10/04/2014 10:04 AM  Dressing Status Clean;Dry;Intact 10/04/2014 10:04 AM  Dressing Change Frequency Twice a day 10/04/2014 10:04 AM  Site / Wound Assessment Granulation tissue;Yellow;Pink 10/04/2014 10:04 AM  % Wound base Red or Granulating 5% 10/04/2014 10:04 AM  % Wound base Yellow 95% 10/04/2014 10:04 AM  %  Wound base Black 0% 10/04/2014 10:04 AM  % Wound base Other (Comment)  0% 10/04/2014 10:04 AM  Peri-wound Assessment Denuded;Edema;Pink;Erythema (non-blanchable);Induration 10/04/2014 10:04 AM  Wound Length (cm) 9 cm 09/30/2014 10:24 AM  Wound Width (cm) 8.8 cm 09/30/2014 10:24 AM  Margins Unattached edges (unapproximated) 10/04/2014 10:04 AM  Closure None 10/04/2014 10:04 AM  Drainage Amount Moderate 10/04/2014 10:04 AM  Drainage Description Serous 10/04/2014 10:04 AM  Non-staged Wound Description Full thickness 10/04/2014 10:04 AM  Treatment Debridement (Selective);Hydrotherapy (Pulse lavage) 10/04/2014 10:04 AM     Incision 05/05/12 Perineum Other (Comment) (Active)   Hydrotherapy Pulsed lavage therapy - wound location: Rt lateral hip, Lt lateral thigh, Lt proximal-posteriomedial thigh Pulsed Lavage with Suction (psi): 4 psi (to 8) Pulsed Lavage with Suction - Normal Saline Used: 2000 mL Pulsed Lavage Tip: Tip with splash shield Selective Debridement Selective Debridement - Location: Rt lateral hip, Lt lateral thigh, Lt proximal-posteriomedial thigh Selective Debridement - Tools Used: Forceps;Scalpel Selective Debridement - Tissue Removed: yellow and black/brown eschar   Wound Assessment and Plan  Wound Therapy - Assess/Plan/Recommendations Wound Therapy - Clinical Statement: Wounds continue to have underlying yellow necrotic tissue as surface layer removed. Lt posterior thigh periwound especially more erythematous (?due to moisture vs infectious?). Continues to be difficult to access Lt posterior/medical thigh for debridement. Wound Therapy - Functional Problem List: painful wounds limiting her mobility tolerance Factors Delaying/Impairing Wound Healing: Diabetes Mellitus;Infection - systemic/local;Immobility;Multiple medical problems;Vascular compromise Hydrotherapy Plan: Debridement;Dressing change;Patient/family education;Pulsatile lavage with suction Wound Therapy - Frequency: 6X /  week Wound Therapy - Current Recommendations: WOC nurse Wound Therapy - Follow Up Recommendations: Skilled nursing facility Wound Plan: see above  Wound Therapy Goals- Improve the function of patient's integumentary system by progressing the wound(s) through the phases of wound healing (inflammation - proliferation - remodeling) by: Decrease Necrotic Tissue to: <75% Decrease Necrotic Tissue - Progress: Progressing toward goal Increase Granulation Tissue to: >25% Increase Granulation Tissue - Progress: Progressing toward goal Improve Drainage Characteristics: Min Improve Drainage Characteristics - Progress: Progressing toward goal  Goals will be updated until maximal potential achieved or discharge criteria met.  Discharge criteria: when goals achieved, discharge from hospital, MD decision/surgical intervention, no progress towards goals, refusal/missing three consecutive treatments without notification or medical reason.  GP     [name] 10/04/2014, 10:19 AM Pager [phone]

## 2014-10-04 NOTE — Progress Notes (Signed)
[facility] PROGRESS NOTE  STACI DACK [id number]:[id number] DOB: April 16, 1950 DOA: 09/09/2014 PCP: Elby Showers, MD   Assessment/Plan:   Septic shock due to Left lower leg cellulitis:  - Initially on Vanc/CIpro/FLagyl from 9/11  - changed to IV cefazolin from 9/14  - blood culture 1/2 group B strep, completed 14 days of antibiotics, between Vanc and ancef  - Ancef stopped 9/24  - Wound care consult placed.  General surgery on board and assisting with case.  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 Renal assisting with diuresis recommendations.  Hypotension - Suspect blood pressure readings not accurate as patient has had reported systolic blood pressure is a 60 but has been able to carry a full conversation with staff. - Due to patient's body habitus I suspect patient's blood pressure recordings are inaccurate. - Cardiology and nephrology assisting with diuresis, patient continues to refuses A line  RUE swelling  -dopplers 9/22 negative, due to third spacing   Type 2 diabetes mellitus:  - A1c 7.0  - lantus increased to 22 units.  - SSI, blood sugars relatively controlled on this regimen  . PAF (paroxysmal atrial fibrillation)  - Currently with a paced rhythm  - Coumadin dosed by pharmacy - continue amiodarone   . Hypothyroidism, acquired, autoimmune: Recent TSH was 2.12 on 05/28/14.  - Continue levothyroxine   Gout: stable  -Continue Uloric   . AKI on CKD (chronic kidney disease) stage 4, GFR 15-29 ml/min: ccl  slightly higher from baseline 2.5-3.00 to 3.38 on admission  - Currently at baseline  -Followed by Dr.Dunham, Renal consulted given overloaded state with CKD, CHF and Hypoalbuminemia  -now on high dose of IV lasix and metolazone   . PACEMAKER, PERMANENT  . Obesity hypoventilation syndrome  - CPAP QHS   . OBESITY, MORBID  Tremors/shakes  -gabapentin stopped, monitor  -improving   Anemia:  -due to CKD and acute illness and iron deficiency -baseline around 9, transfused 2units PRBC 9/22  -Anemia panel c/w Chronic disease, transfuse if Hb <7   Code Status: full  Family Communication: patient  Disposition Plan: Pt does not qualify for LTACH per my discussion with social workers, limited SNF options  Consultants:  PCCM  [facility] Nephrology   Procedures:  Central venous catheter insertion procedure on 09/10/2049    HPI/Subjective: NO new complaints. Pt feels better today.  Objective: Filed Vitals:   10/04/14 0625  BP: 103/49  Pulse: 79  Temp: 97.6 F (36.4 C)  Resp: 18    Intake/Output Summary (Last 24 hours) at 10/04/14 1107 Last data filed at 10/04/14 0800  Gross per 24 hour  Intake    480 ml  Output   1400 ml  Net   -920 ml   Filed Weights   10/01/14 0526  Weight: 136.533 kg (301 lb)    Exam:   General:  Patient in no acute distress, alert and awake  Cardiovascular: S1 and S2 present  Respiratory: No audible wheezes, equal chest rise  Abdomen: Nontender, nondistended  Neuro: answers questions appropriately and moves extremities equally  Data Reviewed: Basic Metabolic Panel:  Recent Labs Lab 09/29/14 0610 09/30/14 0520 10/01/14 0608 10/02/14 0445 10/03/14 0530 10/04/14 0400  NA 131* 131* 130* 131* 135* 132*  K 3.8 3.9 3.8 3.6* 3.7 3.3*  CL 92* 91* 91* 93* 96 93*  CO2 25 24 25 27 28 28   GLUCOSE 160* 148* 165* 169* 162* 180*  BUN 95* 100* 102* 105* 104* 100*  CREATININE 2.91* 2.88* 2.77* 2.75* 2.70* 2.55*  CALCIUM 7.4* 7.3* 7.0* 7.0*  7.1* 7.2*  MG 1.9 1.9 1.9 2.0  --  2.0  PHOS 5.5* 5.5* 5.1* 5.0* 5.0* 4.8*   Liver Function Tests:  Recent Labs Lab 09/30/14 0520 10/01/14 0608 10/02/14 0445 10/03/14 0530 10/04/14 0400  ALBUMIN 1.3* 1.3* 1.3* 1.3* 1.3*   No results found for this basename: LIPASE, AMYLASE,  in the last 168 hours No results found for this basename: AMMONIA,  in the last 168 hours CBC:  Recent Labs Lab 09/28/14 0533 09/29/14 0610 09/30/14 0520  WBC 7.2 6.6 7.4  HGB 7.5* 8.3* 8.2*  HCT 22.4* 25.3* 24.9*  MCV 95.7 99.6 98.4  PLT 192 189 192   Cardiac Enzymes: No results found for this basename: CKTOTAL, CKMB, CKMBINDEX, TROPONINI,  in the last 168 hours BNP (last 3 results)  Recent Labs  09/30/14 0520  PROBNP 8120.0*   CBG:  Recent Labs Lab 10/03/14 0553 10/03/14 1111 10/03/14 1611 10/03/14 2100 10/04/14 0620  GLUCAP 175* 187* 164* 193* 173*    No results found for this or any previous visit (from the past 240 hour(s)).   Studies: No results found.  Scheduled Meds: . amiodarone  400 mg Oral Daily  . calcitRIOL  0.25 mcg Oral BID  . calcium-vitamin D  1 tablet Oral BID  . collagenase   Topical Daily  . febuxostat  80 mg Oral Daily  . furosemide  160 mg Intravenous BID  . insulin aspart  0-15 Units Subcutaneous TID WC  . insulin aspart  0-5 Units Subcutaneous QHS  . insulin aspart  2 Units Subcutaneous TID WC  . insulin glargine  22 Units Subcutaneous QHS  . iron polysaccharides  150 mg Oral BID  . levothyroxine  125 mcg Oral QAC breakfast  . linagliptin  5 mg Oral Daily  . metolazone  2.5 mg Oral QODAY  . multivitamin with minerals  1 tablet Oral Daily  . potassium chloride  20 mEq Oral Daily  . potassium chloride  40 mEq Oral Once  . saccharomyces boulardii  250 mg Oral BID  . sodium chloride  3 mL Intravenous Q12H  . warfarin  2.5 mg Oral ONCE-1800  . 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   Obesity hypoventilation syndrome-    Cardiomyopathy, nonischemic- EF 30-35% June 2014   Type 2 diabetes mellitus   Chronic combined systolic and diastolic CHF   Long term (current) use of anticoagulants   CKD (chronic kidney disease) stage 4, GFR 15-29 ml/min   PAF- recurrent despite Amiodarone and multiple cardioversions   Mitral insufficiency, moderate to severe   Chronic massive bilat LE lymphadema   Fall at home- 3 falls this summer, sound orthostatic   Hypotension   Acute on chronic renal failure   Coarse tremors    Time  spent: > 35 minutes    Velvet Bathe  [facility] Pager [phone] If 7PM-7AM, please contact night-coverage at [email or web address], password [facility] 10/04/2014, 11:07 AM  LOS: 25 days

## 2014-10-05 ENCOUNTER — Telehealth: Payer: Self-pay

## 2014-10-05 LAB — GLUCOSE, CAPILLARY
GLUCOSE-CAPILLARY: 155 mg/dL — AB (ref 70–99)
Glucose-Capillary: 121 mg/dL — ABNORMAL HIGH (ref 70–99)
Glucose-Capillary: 120 mg/dL — ABNORMAL HIGH (ref 70–99)
Glucose-Capillary: 190 mg/dL — ABNORMAL HIGH (ref 70–99)

## 2014-10-05 LAB — RENAL FUNCTION PANEL
ANION GAP: 10 (ref 5–15)
Albumin: 1.4 g/dL — ABNORMAL LOW (ref 3.5–5.2)
BUN: 98 mg/dL — ABNORMAL HIGH (ref 6–23)
CALCIUM: 7.3 mg/dL — AB (ref 8.4–10.5)
CO2: 30 meq/L (ref 19–32)
Chloride: 95 mEq/L — ABNORMAL LOW (ref 96–112)
Creatinine, Ser: 2.52 mg/dL — ABNORMAL HIGH (ref 0.50–1.10)
GFR calc non Af Amer: 19 mL/min — ABNORMAL LOW (ref >90)
GFR, EST AFRICAN AMERICAN: 22 mL/min — AB (ref >90)
GLUCOSE: 125 mg/dL — AB (ref 70–99)
PHOSPHORUS: 4.6 mg/dL (ref 2.3–4.6)
Potassium: 3.5 mEq/L — ABNORMAL LOW (ref 3.7–5.3)
SODIUM: 135 meq/L — AB (ref 137–147)

## 2014-10-05 LAB — PROTIME-INR
INR: 3.4 — AB (ref 0.00–1.49)
Prothrombin Time: 34.3 seconds — ABNORMAL HIGH (ref 11.6–15.2)

## 2014-10-05 LAB — MAGNESIUM: MAGNESIUM: 2 mg/dL (ref 1.5–2.5)

## 2014-10-05 MED ORDER — FENTANYL CITRATE 0.05 MG/ML IJ SOLN: 50.0000 ug | Freq: Once | INTRAMUSCULAR | Status: DC | PRN

## 2014-10-05 MED ORDER — FENTANYL CITRATE 0.05 MG/ML IJ SOLN
50.0000 ug | Freq: Every day | INTRAMUSCULAR | Status: DC | PRN
  Administered 2014-10-05 – 2014-10-07 (×3): 50 ug via INTRAVENOUS
  Filled 2014-10-05 (×3): qty 2

## 2014-10-05 NOTE — Progress Notes (Signed)
[facility] PROGRESS NOTE  [name] EXB:[id number] DOB: 02-09-1950 DOA: 09/09/2014 PCP: Elby Showers, MD Brief Narrative:  64 y.o. female with history of Morbid obesity, CKD 4, fibromyalgia, hypothyroidism, solitary kidney, hypothyroidism, diabetes, nonischemic cardiomyopathy with ejection fraction of 35%, atrial fibrillation who was admitted on 9/10 w/ recurrent Lower extremity cellulitis, immediately went into Septic shock Was in ICU on pressors until 9/14.  Subsequently transferred to the Floor, Patient was on cefazolin from 9/14.  Clinically volume overloaded with anasarca and bring diuresed per Cards.  Since then creatinine trending up too, Renal following now and on high dose IV lasix   Assessment/Plan:   Septic shock due to Left lower leg cellulitis:  - Initially on Vanc/CIpro/FLagyl from 9/11  - changed to IV cefazolin from 9/14  - blood culture 1/2 group B strep, completed 14 days of antibiotics, between Vanc and ancef  - Ancef stopped 9/24  - Wound care consult placed.  General surgery on board and assisting with case.  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 Renal assisting with diuresis recommendations.  Hypotension - Suspect blood pressure readings not accurate as patient has had reported systolic blood pressure is a 60 but has been able to carry a full conversation with staff. - Due to patient's body habitus I suspect patient's blood pressure recordings are inaccurate. - Cardiology and nephrology  assisting with diuresis, patient continues to refuses A line  RUE swelling  -dopplers 9/22 negative, due to third spacing   Type 2 diabetes mellitus:  - A1c 7.0  - lantus increased to 22 units.  - SSI, blood sugars relatively controlled on this regimen  . PAF (paroxysmal atrial fibrillation)  - Currently with a paced rhythm  - Coumadin dosed by pharmacy - continue amiodarone   . Hypothyroidism, acquired, autoimmune: Recent TSH was 2.12 on 05/28/14.  - Continue levothyroxine   Gout: stable  -Continue Uloric   . AKI on CKD (chronic kidney disease) stage 4, GFR 15-29 ml/min: ccl slightly higher from baseline 2.5-3.00 to 3.38 on admission  - Currently at baseline  -Followed by Dr.Dunham, Renal consulted given overloaded state with CKD, CHF and Hypoalbuminemia  -now on high dose of IV lasix and metolazone   . PACEMAKER, PERMANENT  . Obesity hypoventilation syndrome  - CPAP QHS   . OBESITY, MORBID  Tremors/shakes  -gabapentin stopped, monitor  -improving   Anemia:  -due to CKD and acute illness and iron deficiency -baseline around 9, transfused 2units PRBC 9/22  -Anemia panel c/w Chronic disease, transfuse if Hb <7   Code Status: full  Family Communication: patient  Disposition Plan: Pt does not qualify for LTACH per my discussion with social workers, limited SNF options once ok with specialist involved.  Consultants:  PCCM  Cards Nephrology General surgery   Procedures:  Central venous catheter insertion procedure on 09/10/2049  HPI/Subjective: NO new complaints. No acute issues reported overnight.  Objective: Filed Vitals:   10/05/14 0609  BP: 108/55  Pulse: 95  Temp: 97.7 F (36.5 C)  Resp: 18    Intake/Output  Summary (Last 24 hours) at 10/05/14 1225 Last data filed at 10/05/14 0830  Gross per 24 hour  Intake   1080 ml  Output   1500 ml  Net   -420 ml   Filed Weights    Exam:   General:  Patient in no acute distress, alert and  awake  Cardiovascular: S1 and S2 present  Respiratory: No audible wheezes, equal chest rise  Abdomen: Nontender, nondistended  Neuro: answers questions appropriately and moves extremities equally  Data Reviewed: Basic Metabolic Panel:  Recent Labs Lab 09/30/14 0520 10/01/14 0608 10/02/14 0445 10/03/14 0530 10/04/14 0400 10/05/14 0440  NA 131* 130* 131* 135* 132* 135*  K 3.9 3.8 3.6* 3.7 3.3* 3.5*  CL 91* 91* 93* 96 93* 95*  CO2 24 25 27 28 28 30   GLUCOSE 148* 165* 169* 162* 180* 125*  BUN 100* 102* 105* 104* 100* 98*  CREATININE 2.88* 2.77* 2.75* 2.70* 2.55* 2.52*  CALCIUM 7.3* 7.0* 7.0* 7.1* 7.2* 7.3*  MG 1.9 1.9 2.0  --  2.0 2.0  PHOS 5.5* 5.1* 5.0* 5.0* 4.8* 4.6   Liver Function Tests:  Recent Labs Lab 10/01/14 0608 10/02/14 0445 10/03/14 0530 10/04/14 0400 10/05/14 0440  ALBUMIN 1.3* 1.3* 1.3* 1.3* 1.4*   No results found for this basename: LIPASE, AMYLASE,  in the last 168 hours No results found for this basename: AMMONIA,  in the last 168 hours CBC:  Recent Labs Lab 09/29/14 0610 09/30/14 0520  WBC 6.6 7.4  HGB 8.3* 8.2*  HCT 25.3* 24.9*  MCV 99.6 98.4  PLT 189 192   Cardiac Enzymes: No results found for this basename: CKTOTAL, CKMB, CKMBINDEX, TROPONINI,  in the last 168 hours BNP (last 3 results)  Recent Labs  09/30/14 0520  PROBNP 8120.0*   CBG:  Recent Labs Lab 10/04/14 1129 10/04/14 1717 10/04/14 2145 10/05/14 0623 10/05/14 1126  GLUCAP 178* 211* 203* 121* 120*    No results found for this or any previous visit (from the past 240 hour(s)).   Studies: No results found.  Scheduled Meds: . amiodarone  400 mg Oral Daily  . calcitRIOL  0.25 mcg Oral BID  . calcium-vitamin D  1 tablet Oral BID  . collagenase   Topical Daily  . febuxostat  80 mg Oral Daily  . furosemide  160 mg Intravenous BID  . insulin aspart  0-15 Units Subcutaneous TID WC  . insulin aspart  0-5 Units Subcutaneous QHS  . insulin aspart  2 Units  Subcutaneous TID WC  . insulin glargine  22 Units Subcutaneous QHS  . iron polysaccharides  150 mg Oral BID  . levothyroxine  125 mcg Oral QAC breakfast  . linagliptin  5 mg Oral Daily  . metolazone  2.5 mg Oral QODAY  . multivitamin with minerals  1 tablet Oral Daily  . potassium chloride  20 mEq Oral Daily  . saccharomyces boulardii  250 mg Oral BID  . sodium chloride  3 mL Intravenous Q12H  . 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   Obesity hypoventilation syndrome-    Cardiomyopathy, nonischemic- EF 30-35% June 2014   Type 2 diabetes mellitus   Chronic combined systolic and diastolic CHF   Long term (current) use of anticoagulants   CKD (  chronic kidney disease) stage 4, GFR 15-29 ml/min   PAF- recurrent despite Amiodarone and multiple cardioversions   Mitral insufficiency, moderate to severe   Chronic massive bilat LE lymphadema   Fall at home- 3 falls this summer, sound orthostatic   Hypotension   Acute on chronic renal failure   Coarse tremors    Time spent: > 35 minutes    Velvet Bathe  [facility] Pager [phone] If 7PM-7AM, please contact night-coverage at [email or web address], password [email or web address] 10/05/2014, 12:25 PM  LOS: 26 days

## 2014-10-05 NOTE — Progress Notes (Signed)
[facility] Progress Note     Subjective: Pt c/o a lot of pain with dressing changes.  She says only some of the dressings were changed BID yesterday. Hydrotherapy going well, but she says she gets worn out from all the moving.  She's requesting a small dose of IV pain medication before hydrotherapy to help with the pain of hydro and PT afterwards.  Objective: Vital signs in last 24 hours: Temp:  [97.4 F (36.3 C)-97.7 F (36.5 C)] 97.7 F (36.5 C) (10/06 0609) Pulse Rate:  [95-99] 95 (10/06 0609) Resp:  [18] 18 (10/06 0609) BP: (93-108)/(49-55) 108/55 mmHg (10/06 0609) SpO2:  [94 %] 94 % (10/06 0609) Last BM Date: 10/03/14  Intake/Output from previous day: 10/05 0701 - 10/06 0700 In: 1080 [P.O.:1080] Out: 1500 [Urine:1500] Intake/Output this shift:    PE: Gen:  Alert, NAD, pleasant Wounds as below:  Evidence of psuedomonas colonization as seen by a green/blue layer on the tegaderms.  Wounds are macerated.  Some hard cellulitic changes noted to the skin around the wounds.  The legs are very large and skin shows extensive anasarca with chronic skin changes.  She has large creases in the backs of her legs were some of the wounds are located.   Above is left hip/lateral thigh 10/05/14  Above is posterior-medial left thigh 10/05/14  Above is right lateral hip/thigh 10/05/14   Lab Results:  No results found for this basename: WBC, HGB, HCT, PLT,  in the last 72 hours BMET  Recent Labs  10/04/14 0400 10/05/14 0440  NA 132* 135*  K 3.3* 3.5*  CL 93* 95*  CO2 28 30  GLUCOSE 180* 125*  BUN 100* 98*  CREATININE 2.55* 2.52*  CALCIUM 7.2* 7.3*   PT/INR  Recent Labs  10/04/14 0400 10/05/14 0440  LABPROT 29.2* 34.3*  INR 2.76* 3.40*   CMP     Component Value Date/Time   NA 135* 10/05/2014 0440   K 3.5* 10/05/2014 0440   CL 95* 10/05/2014 0440   CO2 30 10/05/2014 0440   GLUCOSE 125* 10/05/2014 0440   BUN 98* 10/05/2014 0440   CREATININE 2.52* 10/05/2014  0440   CREATININE 2.10* 05/28/2014 0001   CREATININE 1.28* 02/12/2011 1000   CALCIUM 7.3* 10/05/2014 0440   CALCIUM 9.5 03/24/2012 1212   PROT 6.4 09/22/2014 0500   ALBUMIN 1.4* 10/05/2014 0440   AST 22 09/22/2014 0500   ALT <5 09/22/2014 0500   ALKPHOS 194* 09/22/2014 0500   BILITOT 0.7 09/22/2014 0500   GFRNONAA 19* 10/05/2014 0440   GFRAA 22* 10/05/2014 0440   Lipase  No results found for this basename: lipase       Studies/Results: No results found.  Anti-infectives: Anti-infectives   Start     Dose/Rate Route Frequency Ordered Stop   09/19/14 2000  ceFAZolin (ANCEF) IVPB 2 g/50 mL premix     2 g 100 mL/hr over 30 Minutes Intravenous Every 8 hours 09/19/14 1307 09/22/14 2032   09/13/14 1200  ceFAZolin (ANCEF) IVPB 2 g/50 mL premix  Status:  Discontinued     2 g 100 mL/hr over 30 Minutes Intravenous Every 12 hours 09/13/14 1141 09/19/14 1307   09/13/14 1000  levofloxacin (LEVAQUIN) IVPB 500 mg  Status:  Discontinued     500 mg 100 mL/hr over 60 Minutes Intravenous Every 48 hours 09/11/14 0958 09/12/14 1640   09/11/14 2300  vancomycin (VANCOCIN) 1,750 mg in sodium chloride 0.9 % 500 mL IVPB  Status:  Discontinued  1,750 mg 250 mL/hr over 120 Minutes Intravenous Every 48 hours 09/09/14 2158 09/13/14 1139   09/11/14 1800  ciprofloxacin (CIPRO) IVPB 400 mg  Status:  Discontinued     400 mg 200 mL/hr over 60 Minutes Intravenous Every 24 hours 09/10/14 1723 09/11/14 0913   09/11/14 1030  levofloxacin (LEVAQUIN) IVPB 500 mg     500 mg 100 mL/hr over 60 Minutes Intravenous  Once 09/11/14 0958 09/11/14 1141   09/10/14 1645  ciprofloxacin (CIPRO) IVPB 400 mg     400 mg 200 mL/hr over 60 Minutes Intravenous STAT 09/10/14 1640 09/10/14 1844   09/09/14 2300  metroNIDAZOLE (FLAGYL) IVPB 500 mg  Status:  Discontinued     500 mg 100 mL/hr over 60 Minutes Intravenous Every 8 hours 09/09/14 2124 09/11/14 0915   09/09/14 2200  vancomycin (VANCOCIN) 1,500 mg in sodium chloride 0.9 % 500 mL  IVPB     1,500 mg 250 mL/hr over 120 Minutes Intravenous  Once 09/09/14 2158 09/10/14 0207   09/09/14 1800  vancomycin (VANCOCIN) IVPB 1000 mg/200 mL premix     1,000 mg 200 mL/hr over 60 Minutes Intravenous  Once 09/09/14 1754 09/09/14 2042       Assessment/Plan 1. Multiple bilateral lower extremity wounds  Plan:  1. I have asked Melody from [facility] to come back to see the patient given all her allergies to adhesives.  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  Some of the dressings were not changed by nursing staff last night because they were the same dressings from the PT with hydrotherapy.  Will increase dressing changes to TID for now given pseudomonas colonization. Use santyl daily.  It takes 1 person to hold her leg up and 1-2 to do the dressing changes.   2. Cont PT hydrotherapy daily 3. Believe it is reasonable for her to get a small dose of IV pain medication prior to dressing changes 4.  Will recheck wounds when hydrotherapy comes today. If patient is medically stable and needs further wound care, would recommend LTAC for more hydrotherapy and wound care prior to d/c home.  5. She will need to be followed by plastics in their wound clinic likely long term for these wounds    LOS: 26 days    DORT, Ramanda Paules 10/05/2014, 7:17 AM Pager: [phone]

## 2014-10-05 NOTE — Progress Notes (Signed)
Physical Therapy Treatment Patient Details Name: [name] MRN: [id number] DOB: 26-Feb-1950 Today's Date: 10/05/2014    History of Present Illness 64 y.o. female  with history of fibromyalgia, hypothyroidism, solitary kidney, hypothyroidism, diabetes, nonischemic cardiomyopathy with ejection fraction of 35%, atrial fibrillation who presented to the emergency department with left-sided lower leg pain. She was admitted on 9/10 w/ recurrent Lower extremity cellulitis. PCCM asked to see in consult on 9/11 for persistent hypotension and worsening renal failure.  She had a previous admission for LE cellulitis in 07/2014 followed by [facility].  Pt had returned home with HHPT prior to this admission.     PT Comments    Pt admitted with above. Pt currently with functional limitations due to strength and endurance deficits.  Pt self limiting today only performing exercises in bed.  She did work really hard with exercises.  Sister and pt state they will continue to work on them throughout the day.  Pt will benefit from skilled PT to increase their independence and safety with mobility to allow discharge to the venue listed below.   Follow Up Recommendations  LTACH;SNF;Supervision/Assistance - 24 hour     Equipment Recommendations  None recommended by PT    Recommendations for Other Services       Precautions / Restrictions Precautions Precautions: Fall Restrictions Weight Bearing Restrictions: No    Mobility  Bed Mobility                  Transfers                    Ambulation/Gait                 Stairs            Wheelchair Mobility    Modified Rankin (Stroke Patients Only)       Balance                                    Cognition Arousal/Alertness: Awake/alert Behavior During Therapy: Anxious Overall Cognitive Status: Within Functional Limits for tasks assessed                      Exercises General Exercises -  Upper Extremity Shoulder Flexion: AROM;Both;15 reps;Seated;Strengthening Bar Weights/Barbell (Shoulder Flexion): 1 lb Shoulder ABduction: AROM;Both;Strengthening;10 reps;Supine;Bar weights/barbell Bar Weights/Barbell (Shoulder Abduction): 1 lb Shoulder Horizontal ABduction: AROM;Strengthening;Both;10 reps;Supine;Bar weights/barbell Bar Weights/Barbell (Shoulder Horizontal Abduction): 1 lb Shoulder Horizontal ADduction: AROM;Both;10 reps;Supine;Bar weights/barbell Bar Weights/Barbell (Shoulder Horizontal Adduction): 1 lb Elbow Flexion: Both;10 reps;Supine;Seated Bar Weights/Barbell (Elbow Flexion): 1 lb Elbow Extension: Both;10 reps;Supine;Seated Bar Weights/Barbell (Elbow Extension): 1 lb Wrist Flexion: AROM;Both;10 reps;Seated;Bar weights/barbell Bar Weights/Barbell (Wrist Flexion): 1 lb Wrist Extension: AROM;Strengthening;Both;10 reps;Seated;Bar weights/barbell Bar Weights/Barbell (Wrist Extension): 1 lb General Exercises - Lower Extremity Ankle Circles/Pumps: AROM;Strengthening;Both;Supine;20 reps Quad Sets: AROM;Both;Supine;15 reps Gluteal Sets: AROM;Both;Supine;15 reps Heel Slides: Both;10 reps;Supine;AAROM;Other (comment) (used leg lifter to assist) Hip ABduction/ADduction: AAROM;Both;10 reps;Supine Straight Leg Raises: AAROM;Both;Supine;10 reps;Other (comment) (used leg lifter to assist.)    General Comments General comments (skin integrity, edema, etc.): Pt refused to sit EOB.  Did exercises instead with HOB in 80 degree position.  Bed will achieve this by putting bed in reverse trendelenburg position with head of bed all the way up and feet up.        Pertinent Vitals/Pain Pain Assessment: Faces Faces Pain Scale: Hurts whole lot Pain  Location: bil LEs Pain Descriptors / Indicators: Aching;Constant;Tightness;Pressure;Numbness Pain Intervention(s): Limited activity within patient's tolerance;Monitored during session;Premedicated before session;Repositioned VSS    Home  Living                      Prior Function            PT Goals (current goals can now be found in the care plan section) Progress towards PT goals: Progressing toward goals    Frequency  Min 2X/week    PT Plan Current plan remains appropriate    Co-evaluation             End of Session   Activity Tolerance: Patient limited by fatigue;Patient limited by pain Patient left: in bed;with call bell/phone within reach;with family/visitor present     Time: 1100-1146 PT Time Calculation (min): 46 min  Charges:  $Therapeutic Exercise: 38-52 mins                    G CodesIrwin Brakeman F 2014-10-13, 11:57 AM [facility] [phone] (pager)

## 2014-10-05 NOTE — Consult Note (Addendum)
WOC re-consult requested by [facility] team; they are following for assessment and plan of care, to assess maceration around wounds and suggest other options.  Pt has refused most topical treatments and states she is allergic to foam dressings and tape.  Wounds continue to decline and pt has not been receptive to alternative dressing suggestions; refer to multiple previous consult notes during this admission. Physical therapy has already completed hydrotherapy today.  Will plan to assess and provide suggestions tomorrow AM when hydro performed since it is very difficult to convince patient to reposition. Julien Girt MSN, RN, [place], [place], [place]

## 2014-10-05 NOTE — Telephone Encounter (Signed)
Spoke with patient's husband, who stated that patient is currently in the hospital.

## 2014-10-05 NOTE — Clinical Social Work Note (Signed)
Patient discussed in [facility] and unit rounds- patient remains unstable for dc- CSW continues to follow for probable SNF placement- Will check in with patient and family tomorrow -  Eduard Clos, MSW, [place]

## 2014-10-05 NOTE — Progress Notes (Signed)
Physical Therapy Wound Treatment Patient Details  Name: [name] MRN: [id number] Date of Birth: [birth year]  Today's Date: 10/05/2014 Time: 3759-1979 Time Calculation (min): 88 min  Subjective  Subjective: Pt reports her skin blisters with all dressings/adhesive except tegaderm (including pink foam) Patient and Family Stated Goals: decr pain in wounds and heal without surgery Date of Onset:  (multiple areas exact date unknown) Prior Treatments: mepitel foam, various types of tape  Pain Score: Pain Score: 9/10 at wound sites during debridement; Aris Georgia PA present and ordered IV pain meds to be administered prior to hydrotherapy; RN in to give IV meds (in addition to oral pain meds she received prior to session)  Wound Assessment   Clinical Statement: Aris Georgia, PA for surgery present to inspect wounds. Wounds continue to have underlying yellow necrotic tissue as surface layer removed. Posterior thigh wound especially moist, macerated. Continues to be difficult to access Lt posterior/medical thigh for debridement. Pressure Ulcer 09/20/14 Deep Tissue Injury - Purple or maroon localized area of discolored intact skin or blood-filled blister due to damage of underlying soft tissue from pressure and/or shear. (Active)  Dressing Type Gauze (Comment) 10/05/2014  9:00 AM  Dressing Clean;Dry 10/05/2014  9:00 AM  Dressing Change Frequency Twice a day 10/01/2014  9:30 AM  State of Healing Non-healing 09/29/2014  8:04 PM  % Wound base Red or Granulating 90% 09/29/2014  8:04 PM  % Wound base Yellow 10% 09/29/2014  8:04 PM  Margins Unattached edges (unapproximated) 09/29/2014  8:04 PM  Drainage Amount Scant 09/29/2014  8:04 PM  Drainage Description Serosanguineous 09/29/2014  8:04 PM  Treatment Cleansed;Other (Comment) 09/29/2014  8:04 PM     Pressure Ulcer 09/20/14 Deep Tissue Injury - Purple or maroon localized area of discolored intact skin or blood-filled blister due to damage of underlying soft  tissue from pressure and/or shear. (Active)  Dressing Type Barrier Film (skin prep);Gauze (Comment);Transparent dressing 10/05/2014 11:31 AM  Dressing Changed 10/05/2014 11:31 AM  Dressing Change Frequency Other (Comment) 10/05/2014 11:31 AM  State of Healing Eschar 10/05/2014 11:31 AM  Site / Wound Assessment Yellow;Granulation tissue;Painful 10/05/2014 11:31 AM  % Wound base Red or Granulating 5% 10/05/2014 11:31 AM  % Wound base Yellow 95% 10/05/2014 11:31 AM  % Wound base Black 0% 10/05/2014 11:31 AM  % Wound base Other (Comment) 0% 10/05/2014 11:31 AM  Peri-wound Assessment Pink;Intact 10/05/2014 11:31 AM  Wound Length (cm) 3.8 cm 09/30/2014 10:24 AM  Wound Width (cm) 5.7 cm 09/30/2014 10:24 AM  Margins Unattached edges (unapproximated) 10/05/2014 11:31 AM  Drainage Amount Minimal 10/05/2014 11:31 AM  Drainage Description Serous 10/05/2014 11:31 AM  Treatment Debridement (Selective);Hydrotherapy (Pulse lavage) 10/05/2014 11:31 AM     Wound / Incision (Open or Dehisced) 09/10/14 Other (Comment) Thigh Medial;Left;Posterior;Proximal (Active)  Dressing Type Gauze (Comment);Barrier Film (skin prep);Transparent dressing 10/05/2014 11:31 AM  Dressing Changed Changed 10/05/2014 11:31 AM  Dressing Status Clean;Dry;Intact 10/05/2014 11:31 AM  Dressing Change Frequency Other (Comment) 10/05/2014 11:31 AM  Site / Wound Assessment Black;Yellow;Pink;Granulation tissue;Painful 10/05/2014 11:31 AM  % Wound base Red or Granulating 10% 10/05/2014 11:31 AM  % Wound base Yellow 65% 10/05/2014 11:31 AM  % Wound base Black 25% 10/05/2014 11:31 AM  Peri-wound Assessment Denuded;Edema;Erythema (non-blanchable);Maceration;Pink 10/05/2014 11:31 AM  Wound Length (cm) 9.1 cm 09/30/2014 10:24 AM  Wound Width (cm) 7.3 cm 09/30/2014 10:24 AM  Margins Unattached edges (unapproximated) 10/05/2014 11:31 AM  Closure None 10/05/2014 11:31 AM  Drainage Amount Copious 10/05/2014 11:31 AM  Drainage  Description Serous 10/05/2014 11:31 AM   Non-staged Wound Description Full thickness 10/05/2014 11:31 AM  Treatment Debridement (Selective);Hydrotherapy (Pulse lavage) 10/05/2014 11:31 AM     Wound / Incision (Open or Dehisced) 09/18/14 Other (Comment) Hip Right Black eschar tissue surrounded by pink tissue (Active)  Dressing Type Gauze (Comment);Barrier Film (skin prep);Transparent dressing 10/05/2014 11:31 AM  Dressing Changed Changed 10/05/2014 11:31 AM  Dressing Status Clean;Dry;Intact 10/05/2014 11:31 AM  Dressing Change Frequency Other (Comment) 10/05/2014 11:31 AM  Site / Wound Assessment Granulation tissue;Yellow;Pink 10/05/2014 11:31 AM  % Wound base Red or Granulating 5% 10/05/2014 11:31 AM  % Wound base Yellow 60% 10/05/2014 11:31 AM  % Wound base Black 35% 10/05/2014 11:31 AM  % Wound base Other (Comment) 0% 10/05/2014 11:31 AM  Peri-wound Assessment Denuded;Edema;Pink;Erythema (non-blanchable);Induration 10/05/2014 11:31 AM  Wound Length (cm) 9 cm 09/30/2014 10:24 AM  Wound Width (cm) 8.8 cm 09/30/2014 10:24 AM  Margins Unattached edges (unapproximated) 10/05/2014 11:31 AM  Closure None 10/05/2014 11:31 AM  Drainage Amount Moderate 10/05/2014 11:31 AM  Drainage Description Serous 10/05/2014 11:31 AM  Non-staged Wound Description Full thickness 10/05/2014 11:31 AM  Treatment Debridement (Selective);Hydrotherapy (Pulse lavage) 10/05/2014 11:31 AM     Incision 05/05/12 Perineum Other (Comment) (Active)   Hydrotherapy Pulsed lavage therapy - wound location: Rt lateral hip, Lt lateral thigh, Lt proximal-posteriomedial thigh Pulsed Lavage with Suction (psi): 4 psi (to 8) Pulsed Lavage with Suction - Normal Saline Used: 2000 mL Pulsed Lavage Tip: Tip with splash shield Selective Debridement Selective Debridement - Location: Rt lateral hip, Lt lateral thigh, Lt proximal-posteriomedial thigh Selective Debridement - Tools Used: Forceps;Scalpel Selective Debridement - Tissue Removed: yellow and black/brown eschar; yellow slough   Wound  Assessment and Plan  Wound Therapy - Assess/Plan/Recommendations Wound Therapy - Clinical Statement: Coralie Keens, PA for surgery present to inspect wounds. Wounds continue to have underlying yellow necrotic tissue as surface layer removed. Posterior thigh wound especially moist, macerated. Continues to be difficult to access Lt posterior/medical thigh for debridement. Wound Therapy - Functional Problem List: painful wounds limiting her mobility tolerance Factors Delaying/Impairing Wound Healing: Diabetes Mellitus;Infection - systemic/local;Immobility;Multiple medical problems;Vascular compromise Hydrotherapy Plan: Debridement;Dressing change;Patient/family education;Pulsatile lavage with suction Wound Therapy - Frequency: 6X / week Wound Therapy - Current Recommendations: WOC nurse Wound Therapy - Follow Up Recommendations: Skilled nursing facility Wound Plan: see above  Wound Therapy Goals- Improve the function of patient's integumentary system by progressing the wound(s) through the phases of wound healing (inflammation - proliferation - remodeling) by: Decrease Necrotic Tissue to: <75% Decrease Necrotic Tissue - Progress: Progressing toward goal Increase Granulation Tissue to: >25% Increase Granulation Tissue - Progress: Progressing toward goal Improve Drainage Characteristics: Min Improve Drainage Characteristics - Progress: Progressing toward goal  Goals will be updated until maximal potential achieved or discharge criteria met.  Discharge criteria: when goals achieved, discharge from hospital, MD decision/surgical intervention, no progress towards goals, refusal/missing three consecutive treatments without notification or medical reason.  GP     Lum Stillinger 10/05/2014, 11:41 AM Pager [phone]

## 2014-10-05 NOTE — Progress Notes (Signed)
ANTICOAGULATION CONSULT NOTE - Follow Up Consult  Pharmacy Consult for coumadin Indication: atrial fibrillation  Allergies  Allergen Reactions  . Ivp Dye [Iodinated Diagnostic Agents] Shortness Of Breath    Turn red, can't breathe  . Betadine [Povidone Iodine]   . Diphenhydramine Hcl Swelling    In hands and eyes  . Fish Allergy Nausea And Vomiting  . Iohexol      Desc: PT TURNS RED AND WHEEZING   . Penicillins Other (See Comments)    Resp arrest as child. Tolerates cephalosporins.  . Povidone-Iodine Other (See Comments)    Wheezing, turn red, can't breath, and blisters  . Promethazine Hcl Other (See Comments)    Low Blood Pressure  . Tape     Blisters, can use paper tape for short periods  . Clindamycin Hives and Rash    wheezing  . Iodine Rash  . Morphine Sulfate Nausea And Vomiting and Rash  . Primaxin [Imipenem] Rash    Patient Measurements: Height: 5' 6.93" (170 cm) Weight:  (bed weight incorrect, pt refuses to get in lift.) IBW/kg (Calculated) : 61.44  Vital Signs: Temp: 97.7 F (36.5 C) (10/06 0609) Temp Source: Oral (10/06 0609) BP: 108/55 mmHg (10/06 0609) Pulse Rate: 95 (10/06 0609)  Labs:  Recent Labs  10/03/14 0530 10/03/14 0845 10/04/14 0400 10/05/14 0440  LABPROT  --  26.5* 29.2* 34.3*  INR  --  2.44* 2.76* 3.40*  CREATININE 2.70*  --  2.55* 2.52*    Estimated Creatinine Clearance: 31.7 ml/min (by C-G formula based on Cr of 2.52).  Assessment: Patient is a 64 y.o F on coumadin for Afib. INR supratherapeutic today, increased from 2.76 to 3.4 despite lower dose coumadin yesterday. No bleeding documented. Amiodarone PTA, now dose decreased to 400 mg daily.  Home dose: 5 mg on Sunday, Tuesday, Thursday and Saturday and 2.5 mg all other days.  Goal of Therapy:  INR 2-3   Plan:  - Hold coumadin today - f/u PT/INR in AM  Maryanna Shape, PharmD, BCPS  Clinical Pharmacist  Pager: [phone]   10/05/2014,11:18 AM

## 2014-10-05 NOTE — Progress Notes (Signed)
SUBJECTIVE:  She denies any SOB.     PHYSICAL EXAM Filed Vitals:   10/03/14 2110 10/04/14 0625 10/04/14 2151 10/05/14 0609  BP: 89/50 103/49 93/49 108/55  Pulse: 95 79 99 95  Temp: 98.1 F (36.7 C) 97.6 F (36.4 C) 97.4 F (36.3 C) 97.7 F (36.5 C)  TempSrc: Oral Oral Oral Oral  Resp: 18 18 18 18   Height:      SpO2: 91% 96% 94% 94%   General:  No acute distress Lungs:  Clear, distant heart sounds Heart:  RRR Abdomen:  Obese Extremities:  Right greater than left leg erythema.    LABS:  Results for orders placed during the hospital encounter of 09/09/14 (from the past 24 hour(s))  GLUCOSE, CAPILLARY     Status: Abnormal   Collection Time    10/04/14 11:29 AM      Result Value Ref Range   Glucose-Capillary 178 (*) 70 - 99 mg/dL   Comment 1 Notify RN    GLUCOSE, CAPILLARY     Status: Abnormal   Collection Time    10/04/14  5:17 PM      Result Value Ref Range   Glucose-Capillary 211 (*) 70 - 99 mg/dL   Comment 1 Documented in Chart     Comment 2 Notify RN    GLUCOSE, CAPILLARY     Status: Abnormal   Collection Time    10/04/14  9:45 PM      Result Value Ref Range   Glucose-Capillary 203 (*) 70 - 99 mg/dL   Comment 1 Documented in Chart     Comment 2 Notify RN    MAGNESIUM     Status: None   Collection Time    10/05/14  4:40 AM      Result Value Ref Range   Magnesium 2.0  1.5 - 2.5 mg/dL  PROTIME-INR     Status: Abnormal   Collection Time    10/05/14  4:40 AM      Result Value Ref Range   Prothrombin Time 34.3 (*) 11.6 - 15.2 seconds   INR 3.40 (*) 0.00 - 1.49  RENAL FUNCTION PANEL     Status: Abnormal   Collection Time    10/05/14  4:40 AM      Result Value Ref Range   Sodium 135 (*) 137 - 147 mEq/L   Potassium 3.5 (*) 3.7 - 5.3 mEq/L   Chloride 95 (*) 96 - 112 mEq/L   CO2 30  19 - 32 mEq/L   Glucose, Bld 125 (*) 70 - 99 mg/dL   BUN 98 (*) 6 - 23 mg/dL   Creatinine, Ser 2.52 (*) 0.50 - 1.10 mg/dL   Calcium 7.3 (*) 8.4 - 10.5 mg/dL   Phosphorus  4.6  2.3 - 4.6 mg/dL   Albumin 1.4 (*) 3.5 - 5.2 g/dL   GFR calc non Af Amer 19 (*) >90 mL/min   GFR calc Af Amer 22 (*) >90 mL/min   Anion gap 10  5 - 15  GLUCOSE, CAPILLARY     Status: Abnormal   Collection Time    10/05/14  6:23 AM      Result Value Ref Range   Glucose-Capillary 121 (*) 70 - 99 mg/dL   Comment 1 Notify RN      Intake/Output Summary (Last 24 hours) at 10/05/14 0931 Last data filed at 10/05/14 0612  Gross per 24 hour  Intake    840 ml  Output   1500 ml  Net   -660 ml     ASSESSMENT AND PLAN:  ACUTE ON CHRONIC SYSTOLIC AND DIASTOLIC HF:    24 liters negative since admission.   Her weights are all over the place (416 yesterday 301 today.) I suspect the truth is somewhere in the middle.   Continue current diuresis.    HYPOTENSION:    BP OK.    ATRIAL FIB:   Warfarin per pharmacy.  INR is up.    CKD 4:    Creat is stable.    Jeneen Rinks [facility] 10/05/2014 9:31 AM

## 2014-10-06 DIAGNOSIS — I480 Paroxysmal atrial fibrillation: Secondary | ICD-10-CM

## 2014-10-06 DIAGNOSIS — L0390 Cellulitis, unspecified: Secondary | ICD-10-CM

## 2014-10-06 DIAGNOSIS — N179 Acute kidney failure, unspecified: Secondary | ICD-10-CM

## 2014-10-06 DIAGNOSIS — N189 Chronic kidney disease, unspecified: Secondary | ICD-10-CM

## 2014-10-06 DIAGNOSIS — L0291 Cutaneous abscess, unspecified: Secondary | ICD-10-CM

## 2014-10-06 LAB — RENAL FUNCTION PANEL
ANION GAP: 11 (ref 5–15)
Albumin: 1.4 g/dL — ABNORMAL LOW (ref 3.5–5.2)
BUN: 99 mg/dL — ABNORMAL HIGH (ref 6–23)
CHLORIDE: 92 meq/L — AB (ref 96–112)
CO2: 30 meq/L (ref 19–32)
CREATININE: 2.47 mg/dL — AB (ref 0.50–1.10)
Calcium: 7.4 mg/dL — ABNORMAL LOW (ref 8.4–10.5)
GFR, EST AFRICAN AMERICAN: 22 mL/min — AB (ref >90)
GFR, EST NON AFRICAN AMERICAN: 19 mL/min — AB (ref >90)
Glucose, Bld: 170 mg/dL — ABNORMAL HIGH (ref 70–99)
Phosphorus: 4.6 mg/dL (ref 2.3–4.6)
Potassium: 3.5 mEq/L — ABNORMAL LOW (ref 3.7–5.3)
Sodium: 133 mEq/L — ABNORMAL LOW (ref 137–147)

## 2014-10-06 LAB — PROTIME-INR
INR: 3.73 — ABNORMAL HIGH (ref 0.00–1.49)
PROTHROMBIN TIME: 36.9 s — AB (ref 11.6–15.2)

## 2014-10-06 LAB — GLUCOSE, CAPILLARY
GLUCOSE-CAPILLARY: 182 mg/dL — AB (ref 70–99)
GLUCOSE-CAPILLARY: 181 mg/dL — AB (ref 70–99)
Glucose-Capillary: 175 mg/dL — ABNORMAL HIGH (ref 70–99)
Glucose-Capillary: 177 mg/dL — ABNORMAL HIGH (ref 70–99)

## 2014-10-06 LAB — MAGNESIUM: Magnesium: 1.9 mg/dL (ref 1.5–2.5)

## 2014-10-06 MED ORDER — COLLAGENASE 250 UNIT/GM EX OINT
TOPICAL_OINTMENT | Freq: Three times a day (TID) | CUTANEOUS | Status: DC
  Administered 2014-10-06 – 2014-10-20 (×22): via TOPICAL
  Administered 2014-10-21 (×2): 1 via TOPICAL
  Administered 2014-10-21 – 2014-10-23 (×5): via TOPICAL
  Administered 2014-10-24: 1 via TOPICAL
  Administered 2014-10-25 – 2014-10-26 (×5): via TOPICAL
  Filled 2014-10-06 (×17): qty 30

## 2014-10-06 MED ORDER — TORSEMIDE 20 MG PO TABS
60.0000 mg | ORAL_TABLET | Freq: Two times a day (BID) | ORAL | Status: DC
  Administered 2014-10-06 – 2014-10-26 (×36): 60 mg via ORAL
  Filled 2014-10-06 (×43): qty 3

## 2014-10-06 MED ORDER — POTASSIUM CHLORIDE ER 10 MEQ PO TBCR
20.0000 meq | EXTENDED_RELEASE_TABLET | Freq: Two times a day (BID) | ORAL | Status: DC
Start: 2014-10-06 — End: 2014-10-14
  Administered 2014-10-06 – 2014-10-14 (×16): 20 meq via ORAL
  Filled 2014-10-06 (×18): qty 2

## 2014-10-06 MED ORDER — COLLAGENASE 250 UNIT/GM EX OINT
TOPICAL_OINTMENT | Freq: Three times a day (TID) | CUTANEOUS | Status: DC
  Filled 2014-10-06: qty 30

## 2014-10-06 MED ORDER — TORSEMIDE 20 MG PO TABS
80.0000 mg | ORAL_TABLET | Freq: Two times a day (BID) | ORAL | Status: DC
Start: 2014-10-06 — End: 2014-10-06

## 2014-10-06 NOTE — Progress Notes (Signed)
Physical Therapy Treatment Patient Details Name: [name] MRN: [id number] DOB: 1950-03-17 Today's Date: 10/06/2014    History of Present Illness 64 y.o. female  with history of fibromyalgia, hypothyroidism, solitary kidney, hypothyroidism, diabetes, nonischemic cardiomyopathy with ejection fraction of 35%, atrial fibrillation who presented to the emergency department with left-sided lower leg pain. She was admitted on 9/10 w/ recurrent Lower extremity cellulitis. PCCM asked to see in consult on 9/11 for persistent hypotension and worsening renal failure.  She had a previous admission for LE cellulitis in 07/2014 followed by [facility].  Pt had returned home with HHPT prior to this admission.     PT Comments    Pt admitted with above. Pt currently with functional limitations due to balance and endurance deficits.  Pt having a bad day.  Exercised minimally and adjusted in bed.  Pt will benefit from skilled PT to increase their independence and safety with mobility to allow discharge to the venue listed below.   Follow Up Recommendations  LTACH;SNF;Supervision/Assistance - 24 hour     Equipment Recommendations  None recommended by PT    Recommendations for Other Services       Precautions / Restrictions Precautions Precautions: Fall Restrictions Weight Bearing Restrictions: No    Mobility  Bed Mobility                  Transfers                    Ambulation/Gait                 Stairs            Wheelchair Mobility    Modified Rankin (Stroke Patients Only)       Balance                                    Cognition Arousal/Alertness: Awake/alert Behavior During Therapy: Anxious Overall Cognitive Status: Within Functional Limits for tasks assessed                      Exercises General Exercises - Upper Extremity Shoulder Flexion: AROM;Both;15 reps;Seated;Strengthening Bar Weights/Barbell (Shoulder Flexion): 1  lb Elbow Flexion: Both;10 reps;Supine;Seated Bar Weights/Barbell (Elbow Flexion): 1 lb General Exercises - Lower Extremity Ankle Circles/Pumps: AROM;Strengthening;Both;Supine;20 reps Quad Sets: AROM;Both;Supine;15 reps Gluteal Sets: AROM;Both;Supine;15 reps    General Comments General comments (skin integrity, edema, etc.): Pt refusing to get to EOB.  MD in agreement per sister.  Discussed that PT can alternate days but has been trying to have pt alternate between sitting EOB and exercising to gain more mobility.  Progressively inclined the bed to 60 degrees for [facility] with LEs elevated.        Pertinent Vitals/Pain Pain Assessment: Faces Faces Pain Scale: Hurts worst Pain Location: bil LEs Pain Descriptors / Indicators: Aching;Numbness;Pressure;Sore;Tightness Pain Intervention(s): Limited activity within patient's tolerance;Monitored during session;Repositioned    Home Living                      Prior Function            PT Goals (current goals can now be found in the care plan section) Progress towards PT goals: Not progressing toward goals - comment (due to pain in bil LEs)    Frequency  Min 2X/week    PT Plan Current plan remains appropriate  Co-evaluation             End of Session   Activity Tolerance: Patient limited by fatigue;Patient limited by pain Patient left: in bed;with call bell/phone within reach;with family/visitor present     Time: 1327-1350 PT Time Calculation (min): 23 min  Charges:  $Therapeutic Exercise: 8-22 mins                    G Codes:      Denice Paradise Oct 14, 2014, 2:26 PM  Cordarro Spinnato,PT Acute Rehabilitation [phone] (pager)

## 2014-10-06 NOTE — Progress Notes (Signed)
ANTICOAGULATION CONSULT NOTE - Follow Up Consult  Pharmacy Consult for coumadin Indication: atrial fibrillation  Allergies  Allergen Reactions  . Ivp Dye [Iodinated Diagnostic Agents] Shortness Of Breath    Turn red, can't breathe  . Betadine [Povidone Iodine]   . Diphenhydramine Hcl Swelling    In hands and eyes  . Fish Allergy Nausea And Vomiting  . Iohexol      Desc: PT TURNS RED AND WHEEZING   . Penicillins Other (See Comments)    Resp arrest as child. Tolerates cephalosporins.  . Povidone-Iodine Other (See Comments)    Wheezing, turn red, can't breath, and blisters  . Promethazine Hcl Other (See Comments)    Low Blood Pressure  . Tape     Blisters, can use paper tape for short periods  . Clindamycin Hives and Rash    wheezing  . Iodine Rash  . Morphine Sulfate Nausea And Vomiting and Rash  . Primaxin [Imipenem] Rash    Patient Measurements: Height: 5' 6.93" (170 cm) Weight:  (bed inaccurate, pt refuses lift) IBW/kg (Calculated) : 61.44  Vital Signs: Temp: 97.7 F (36.5 C) (10/07 0535) Temp Source: Oral (10/07 0535) BP: 89/61 mmHg (10/07 0535) Pulse Rate: 98 (10/07 0535)  Labs:  Recent Labs  10/04/14 0400 10/05/14 0440 10/06/14 0500  LABPROT 29.2* 34.3* 36.9*  INR 2.76* 3.40* 3.73*  CREATININE 2.55* 2.52* 2.47*    Estimated Creatinine Clearance: 32.3 ml/min (by C-G formula based on Cr of 2.47).  Assessment: Patient is a 64 y.o F on coumadin for Afib. INR (3.73) supratherapeutic today, continue trending up despite lower dose Monday and holding Tuesday. No bleeding documented. No drug interaction identified except for Amiodarone, she was on it PTA, and now dose has been decreased to 400 mg daily. Decent PO intake,   Home dose: 5 mg on Sunday, Tuesday, Thursday and Saturday and 2.5 mg all other days.  Goal of Therapy:  INR 2-3   Plan:  - Continue to hold coumadin today - f/u PT/INR in AM  Maryanna Shape, PharmD, BCPS  Clinical Pharmacist  Pager:  [phone]   10/06/2014,11:34 AM

## 2014-10-06 NOTE — Progress Notes (Signed)
[facility] Progress Note     Subjective: Patient is miserable, crying and emotional and argumentative.    Objective: Vital signs in last 24 hours: Temp:  [97.5 F (36.4 C)-98 F (36.7 C)] 97.7 F (36.5 C) (10/07 0535) Pulse Rate:  [84-100] 98 (10/07 0535) Resp:  [18] 18 (10/07 0535) BP: (89-101)/(47-65) 89/61 mmHg (10/07 0535) SpO2:  [91 %-96 %] 91 % (10/07 0535) Last BM Date: 10/05/14  Intake/Output from previous day: 10/06 0701 - 10/07 0700 In: 1200 [P.O.:1200] Out: 2250 [Urine:2250] Intake/Output this shift:    PE: Gen:  Alert, NAD, pleasant Wounds as below: No improvement today.  Evidence of psuedomonas colonization as seen by a green/blue layer on the tegaderms and smell. Wounds are macerated. Some hard cellulitic changes noted to the skin around the wounds. The legs are very large and skin shows extensive anasarca with chronic skin changes. She has large creases in the backs of her legs were some of the wounds are located.   Lab Results:  No results found for this basename: WBC, HGB, HCT, PLT,  in the last 72 hours BMET  Recent Labs  10/05/14 0440 10/06/14 0500  NA 135* 133*  K 3.5* 3.5*  CL 95* 92*  CO2 30 30  GLUCOSE 125* 170*  BUN 98* 99*  CREATININE 2.52* 2.47*  CALCIUM 7.3* 7.4*   PT/INR  Recent Labs  10/05/14 0440 10/06/14 0500  LABPROT 34.3* 36.9*  INR 3.40* 3.73*   CMP     Component Value Date/Time   NA 133* 10/06/2014 0500   K 3.5* 10/06/2014 0500   CL 92* 10/06/2014 0500   CO2 30 10/06/2014 0500   GLUCOSE 170* 10/06/2014 0500   BUN 99* 10/06/2014 0500   CREATININE 2.47* 10/06/2014 0500   CREATININE 2.10* 05/28/2014 0001   CREATININE 1.28* 02/12/2011 1000   CALCIUM 7.4* 10/06/2014 0500   CALCIUM 9.5 03/24/2012 1212   PROT 6.4 09/22/2014 0500   ALBUMIN 1.4* 10/06/2014 0500   AST 22 09/22/2014 0500   ALT <5 09/22/2014 0500   ALKPHOS 194* 09/22/2014 0500   BILITOT 0.7 09/22/2014 0500   GFRNONAA 19* 10/06/2014 0500   GFRAA 22*  10/06/2014 0500   Lipase  No results found for this basename: lipase       Studies/Results: No results found.  Anti-infectives: Anti-infectives   Start     Dose/Rate Route Frequency Ordered Stop   09/19/14 2000  ceFAZolin (ANCEF) IVPB 2 g/50 mL premix     2 g 100 mL/hr over 30 Minutes Intravenous Every 8 hours 09/19/14 1307 09/22/14 2032   09/13/14 1200  ceFAZolin (ANCEF) IVPB 2 g/50 mL premix  Status:  Discontinued     2 g 100 mL/hr over 30 Minutes Intravenous Every 12 hours 09/13/14 1141 09/19/14 1307   09/13/14 1000  levofloxacin (LEVAQUIN) IVPB 500 mg  Status:  Discontinued     500 mg 100 mL/hr over 60 Minutes Intravenous Every 48 hours 09/11/14 0958 09/12/14 1640   09/11/14 2300  vancomycin (VANCOCIN) 1,750 mg in sodium chloride 0.9 % 500 mL IVPB  Status:  Discontinued     1,750 mg 250 mL/hr over 120 Minutes Intravenous Every 48 hours 09/09/14 2158 09/13/14 1139   09/11/14 1800  ciprofloxacin (CIPRO) IVPB 400 mg  Status:  Discontinued     400 mg 200 mL/hr over 60 Minutes Intravenous Every 24 hours 09/10/14 1723 09/11/14 0913   09/11/14 1030  levofloxacin (LEVAQUIN) IVPB 500 mg     500 mg  100 mL/hr over 60 Minutes Intravenous  Once 09/11/14 0958 09/11/14 1141   09/10/14 1645  ciprofloxacin (CIPRO) IVPB 400 mg     400 mg 200 mL/hr over 60 Minutes Intravenous STAT 09/10/14 1640 09/10/14 1844   09/09/14 2300  metroNIDAZOLE (FLAGYL) IVPB 500 mg  Status:  Discontinued     500 mg 100 mL/hr over 60 Minutes Intravenous Every 8 hours 09/09/14 2124 09/11/14 0915   09/09/14 2200  vancomycin (VANCOCIN) 1,500 mg in sodium chloride 0.9 % 500 mL IVPB     1,500 mg 250 mL/hr over 120 Minutes Intravenous  Once 09/09/14 2158 09/10/14 0207   09/09/14 1800  vancomycin (VANCOCIN) IVPB 1000 mg/200 mL premix     1,000 mg 200 mL/hr over 60 Minutes Intravenous  Once 09/09/14 1754 09/09/14 2042       Assessment/Plan 1. Multiple bilateral lower extremity wounds   Plan:  1. Tegaderm  dressings are NOT working, they are making the wound macerated.  Dawn WOC RN has seen the patient and will place santyl then dry dressings over the wounds with mesh underwear on the legs to hold the dressing in place since she can't tolerate tape 2. Cont PT hydrotherapy daily  3. Believe it is reasonable for her to get a small dose of IV pain medication prior to hydrotherapy 4. If she doesn't qualify for LTAC (which I think she'd benefit from) then she'll have to go to SNF at discharge, SHE WILL NEED HYDROTHERAPY, SANTYL AND TID DRESSING CHANGES AT DISCHARGE 5. She will need to be followed by plastics in their wound clinic likely long term for these wounds 6. No general surgery follow up needed, will re-eval Friday if she's still here     LOS: 27 days    DORT, Melah Ebling 10/06/2014, 7:37 AM Pager: [phone]

## 2014-10-06 NOTE — Progress Notes (Signed)
Pt refuses CPAP at this time.  

## 2014-10-06 NOTE — Consult Note (Addendum)
WOC re-consult requested by CCS team; they are following for assessment and plan of care. Refer to multiple previous [facility] consult notes during this admission, and [facility] notes for wound descriptions and photos. Physical therapy at bedside to perform hydro and [facility] also present to discuss wounds and assess.  Pt is having a large amt drainage from all wounds and strong odor, maceration surrounding all sites in the shape of the Tegaderm dressings.  In the past, this is the only type of dressing the patient would allow; it is very difficult to convince patient to reposition or consider alternative dressings.   Plan: Continue air mattress and hydrotherapy when patient is discharged to SNF for optimal plan of care to promote healing. Continue Santyl ointment to chemically debride nonviable tissue.  Discontinue use of Tegaderm since it is trapping moisture in the wound bed and surrounding skin. Mesh panties can be used to hold ABD pads over the Santyl and moist gauze dressings; this will minimize discomfort instead of removing Tegaderm TID with dressing changes and should contain drainage in a more effective manner. Pt states she is in agreement with this plan of care.  Please re-consult if further assistance is needed.  Thank-you,  Julien Girt MSN, [place], [place], [place], [place]

## 2014-10-06 NOTE — Progress Notes (Signed)
Physical Therapy Wound Treatment Patient Details  Name: [name] MRN: [id number] Date of Birth: [birth year]  Today's Date: 10/06/2014 Time: 0827-0941 Time Calculation (min): 74 min  Subjective  Subjective: Pt reports her skin blisters with all dressings/adhesive except tegaderm (including pink foam) Patient and Family Stated Goals: decr pain in wounds and heal without surgery Date of Onset:  (multiple areas exact date unknown) Prior Treatments: mepitel foam, various types of tape  Pain Score:   9/10 and tearful despite pre-medication (oral and IV meds); treatment to pt's tolerance  Wound Assessment  Clinical Statement: [name], [place] and Dawn, WOC RN present to inspect wounds and problem-solve how to keep dressings in place without use of adhesive or tegaderm (as wounds are remaining too moist). Decided to try using mesh "tube" (made from mesh briefs) to hold bandages over her hip/thighs. Placed interdry material along inner thigh under the mesh to protect inner thighs. Wounds with incr odor this date. Pt more painful and tolerated less pressure with pulse lavage and less debridement.   Pressure Ulcer 09/20/14 Deep Tissue Injury - Purple or maroon localized area of discolored intact skin or blood-filled blister due to damage of underlying soft tissue from pressure and/or shear. (Active)  Dressing Type Gauze (Comment);ABD;Moist to moist;Mesh briefs;Other (Comment) 10/06/2014  9:57 AM  Dressing Changed 10/06/2014  9:57 AM  Dressing Change Frequency Other (Comment) 10/06/2014  9:57 AM  State of Healing Eschar 10/06/2014  9:57 AM  Site / Wound Assessment Yellow;Granulation tissue;Painful 10/06/2014  9:57 AM  % Wound base Red or Granulating 5% 10/06/2014  9:57 AM  % Wound base Yellow 95% 10/06/2014  9:57 AM  % Wound base Black 0% 10/06/2014  9:57 AM  % Wound base Other (Comment) 0% 10/06/2014  9:57 AM  Peri-wound Assessment Pink;Intact 10/06/2014  9:57 AM  Wound Length (cm) 3.8 cm 09/30/2014 10:24 AM   Wound Width (cm) 5.7 cm 09/30/2014 10:24 AM  Margins Unattached edges (unapproximated) 10/06/2014  9:57 AM  Drainage Amount Moderate 10/06/2014  9:57 AM  Drainage Description Serous;Odor 10/06/2014  9:57 AM  Treatment Debridement (Selective);Hydrotherapy (Pulse lavage) 10/06/2014  9:57 AM     Wound 06/02/13 Abrasion(s) Leg Left (Active)  Dressing Type Gauze (Comment);Moist to moist;ABD;Mesh briefs 10/06/2014  9:57 AM  Dressing Changed Changed 10/06/2014  9:57 AM  Dressing Status Clean;Dry;Intact 10/06/2014  9:57 AM  Dressing Change Frequency Daily 10/06/2014  9:57 AM  Site / Wound Assessment Red 10/06/2014  9:57 AM  % Wound base Red or Granulating 100% 10/06/2014  9:57 AM  % Wound base Yellow 0% 10/06/2014  9:57 AM  % Wound base Black 0% 10/06/2014  9:57 AM  Peri-wound Assessment Intact;Edema;Erythema (blanchable) 10/06/2014  9:57 AM  Margins Unattached edges (unapproximated) 10/06/2014  9:57 AM  Closure None 10/06/2014  9:57 AM  Drainage Amount Moderate 10/06/2014  9:57 AM  Drainage Description Serous 10/06/2014  9:57 AM  Non-staged Wound Description Full thickness 10/06/2014  9:57 AM  Treatment Cleansed 10/06/2014  9:57 AM        Wound / Incision (Open or Dehisced) 09/10/14 Other (Comment) Thigh Medial;Left;Posterior;Proximal (Active)  Dressing Type Gauze (Comment);ABD;Mesh briefs;Moist to moist;Other (Comment) 10/06/2014  9:57 AM  Dressing Changed Changed 10/06/2014  9:57 AM  Dressing Status Clean;Dry;Intact 10/06/2014  9:57 AM  Dressing Change Frequency Other (Comment) 10/06/2014  9:57 AM  Site / Wound Assessment Black;Yellow;Pink;Granulation tissue;Painful 10/06/2014  9:57 AM  % Wound base Red or Granulating 5% 10/06/2014  9:57 AM  % Wound base Yellow 45% 10/06/2014  9:57 AM  % Wound base Black 50% 10/06/2014  9:57 AM  Peri-wound Assessment Edema;Erythema (non-blanchable);Maceration;Pink 10/06/2014  9:57 AM  Wound Length (cm) 9.1 cm 09/30/2014 10:24 AM  Wound Width (cm) 7.3 cm 09/30/2014 10:24 AM   Margins Unattached edges (unapproximated) 10/06/2014  9:57 AM  Closure None 10/06/2014  9:57 AM  Drainage Amount Copious 10/06/2014  9:57 AM  Drainage Description Serous;Odor 10/06/2014  9:57 AM  Non-staged Wound Description Full thickness 10/06/2014  9:57 AM  Treatment Debridement (Selective);Hydrotherapy (Pulse lavage) 10/06/2014  9:57 AM     Wound / Incision (Open or Dehisced) 09/18/14 Other (Comment) Hip Right Black eschar tissue surrounded by pink tissue (Active)  Dressing Type Gauze (Comment);ABD;Moist to moist;Mesh briefs 10/06/2014  9:57 AM  Dressing Changed Changed 10/06/2014  9:57 AM  Dressing Status Clean;Dry;Intact 10/06/2014  9:57 AM  Dressing Change Frequency Other (Comment) 10/06/2014  9:57 AM  Site / Wound Assessment Granulation tissue;Yellow;Pink 10/06/2014  9:57 AM  % Wound base Red or Granulating 5% 10/06/2014  9:57 AM  % Wound base Yellow 60% 10/06/2014  9:57 AM  % Wound base Black 35% 10/06/2014  9:57 AM  % Wound base Other (Comment) 0% 10/06/2014  9:57 AM  Peri-wound Assessment Edema;Pink;Erythema (non-blanchable);Induration 10/06/2014  9:57 AM  Wound Length (cm) 9 cm 09/30/2014 10:24 AM  Wound Width (cm) 8.8 cm 09/30/2014 10:24 AM  Margins Unattached edges (unapproximated) 10/06/2014  9:57 AM  Closure None 10/06/2014  9:57 AM  Drainage Amount Moderate 10/06/2014  9:57 AM  Drainage Description Serous;Odor 10/06/2014  9:57 AM  Non-staged Wound Description Full thickness 10/06/2014  9:57 AM  Treatment Debridement (Selective);Hydrotherapy (Pulse lavage) 10/06/2014  9:57 AM     Incision 05/05/12 Perineum Other (Comment) (Active)   Hydrotherapy Pulsed lavage therapy - wound location: Rt lateral hip, Lt lateral thigh, Lt proximal-posteriomedial thigh Pulsed Lavage with Suction (psi): 4 psi (to 8) Pulsed Lavage with Suction - Normal Saline Used: 2000 mL Pulsed Lavage Tip: Tip with splash shield Selective Debridement Selective Debridement - Location: Rt lateral hip, Lt lateral thigh, Lt  proximal-posteriomedial thigh Selective Debridement - Tools Used: Forceps;Scalpel Selective Debridement - Tissue Removed: yellow and black/brown eschar; yellow slough   Wound Assessment and Plan  Wound Therapy - Assess/Plan/Recommendations Wound Therapy - Clinical Statement: Megan, PA and Dawn, WOC RN present to inspect wounds and problem-solve how to keep dressings in place without use of adhesive or tegaderm (as wounds are remaining too moist). Decided to try using mesh "tube" (made from mesh briefs) to hold bandages over her hip/thighs. Placed interdry material along inner thigh under the mesh to protect inner thighs. Wounds with incr odor this date. Pt more painful and tolerated less pressure with pulse lavage and less debridement.  Wound Therapy - Functional Problem List: painful wounds limiting her mobility tolerance Factors Delaying/Impairing Wound Healing: Diabetes Mellitus;Infection - systemic/local;Immobility;Multiple medical problems;Vascular compromise Hydrotherapy Plan: Debridement;Dressing change;Patient/family education;Pulsatile lavage with suction Wound Therapy - Frequency: 6X / week Wound Therapy - Current Recommendations: WOC nurse Wound Therapy - Follow Up Recommendations: Other (comment) (LTACH) Wound Plan: see above  Wound Therapy Goals- Improve the function of patient's integumentary system by progressing the wound(s) through the phases of wound healing (inflammation - proliferation - remodeling) by: Decrease Necrotic Tissue to: <75% Decrease Necrotic Tissue - Progress: Not progressing Increase Granulation Tissue to: >25% Increase Granulation Tissue - Progress: Mot progressing Improve Drainage Characteristics: Min Improve Drainage Characteristics - Progress: Not progressing  Goals will be updated until maximal potential achieved or discharge criteria  met.  Discharge criteria: when goals achieved, discharge from hospital, MD decision/surgical intervention, no progress  towards goals, refusal/missing three consecutive treatments without notification or medical reason.  GP     Ellisyn Icenhower 10/06/2014, 11:09 AM Pager [phone]

## 2014-10-06 NOTE — Progress Notes (Signed)
    Subjective:  Multiple complaints but nothing cardiac related. She and her sister tell me that she cannot continue with frequent physical therapy as it is too difficult for her. Lots of pain in her legs.  Objective:  Vital Signs in the last 24 hours: Temp:  [97.5 F (36.4 C)-98 F (36.7 C)] 97.7 F (36.5 C) (10/07 0535) Pulse Rate:  [84-100] 98 (10/07 0535) Resp:  [18] 18 (10/07 0535) BP: (89-101)/(47-65) 89/61 mmHg (10/07 0535) SpO2:  [91 %-96 %] 91 % (10/07 0535)  Intake/Output from previous day: 10/06 0701 - 10/07 0700 In: 1200 [P.O.:1200] Out: 2250 [Urine:2250]  Physical Exam: Pt is alert and oriented, morbidly obese woman in NAD HEENT: normal Neck: JVP - normal Lungs: CTA bilaterally CV: RRR without murmur or gallop Abd: soft, obese Ext: Massive edema/anasarca  Lab Results: No results found for this basename: WBC, HGB, PLT,  in the last 72 hours  Recent Labs  10/05/14 0440 10/06/14 0500  NA 135* 133*  K 3.5* 3.5*  CL 95* 92*  CO2 30 30  GLUCOSE 125* 170*  BUN 98* 99*  CREATININE 2.52* 2.47*   No results found for this basename: TROPONINI, CK, MB,  in the last 72 hours  Assessment/Plan:  1. Acute on chronic combined systolic and diastolic heart failure. Nearly impossible to assess her volume status, although she has had a large volume diuresis. Weights have been inaccurate and not recorded now for several days. Recommend that we change her back to oral diuretics with a combination of torsemide and metolazone. She has marked hypoalbuminemia with an albumin of 1.4 which is contributing to her anasarca.  2. Hypotension, improved. Blood pressure appears to be in range today.  3. Atrial fibrillation. The patient is maintained on warfarin. INR is 3.7. Management per pharmacy.  4. Stage IV chronic kidney disease. Overall renal function is stable with a creatinine of 2.47 mg/dL today.   Sherren Mocha, M.D. 10/06/2014, 12:49 PM

## 2014-10-06 NOTE — Progress Notes (Signed)
PROGRESS NOTE    [name]:[id number] DOB: 12-23-1950 DOA: 09/09/2014 PCP: Elby Showers, MD  HPI/Brief narrative 64 y.o. female with history of Morbid obesity, CKD 4, fibromyalgia, hypothyroidism, solitary kidney, hypothyroidism, diabetes, nonischemic cardiomyopathy with ejection fraction of 35%, atrial fibrillation who was admitted on 9/10 w/ recurrent Lower extremity cellulitis, immediately went into Septic shock- was in ICU on pressors until 9/14. Subsequently transferred to the Floor, Patient was on cefazolin from 9/14. Clinically volume overloaded with anasarca and bring diuresed per Cards.    Assessment/Plan:  1. Septic shock: Due to bilateral leg cellulitis. Required vasopressors in ICU. Resolved. 2. Multiple bilateral lower extremity wounds: Completed antibiotic course. Surgery & WOC followup appreciated. Continue hydrotherapy with PT. Apparently does not qualify for LTAC and hence will need SNF at DC. She will need OP follow up with plastic surgery. 3. Acute on chronic combined systolic and diastolic CHF/anasarca: Cardiology following. Difficult to assess volume status despite large volume diuresis. Weights have been inaccurate and not regularly recorded. Cardiology switching her to oral diuretics-torsemide and metolazone. Marked hypoalbuminemia also contributing to anasarca. 4. A. Fib/s/p PPM: Controlled ventricular rate. INR 3.7. Coumadin per pharmacy. 5. Hypotension: Continues to have intermittent soft blood pressures. 6. 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 Continue Lantus, NovoLog mealtime and SSI. 7. Hypothyroid: Continue levothyroxine. Recent TSH 2. On 05/29/1511 8. History of gout: Stable  9. History of OHS/morbid obesity: Nightly CPAP 10. Tremors: Gabapentin discontinued. Seems to have resolved.  11. Acute on stage IV chronic kidney disease: No baseline creatinine probably in the low 2 range. Creatinine continues to  improve. 12. RUE swelling: Dopplers negative for DVT. Likely from anasarca. 13. Anemia: Secondary to chronic kidney disease, acute illness and iron deficiency: Stable 14. Hypokalemia: Replace and follow   Code Status: Full Family Communication: None at bedside Disposition Plan: SNF when stable   Consultants:  General surgery   Cardiology  Critical care medicine-signed off  Nephrology-seem to have signed off  WOC  Procedures:  Central venous catheter insertion procedure on 09/10/2014  Hydrotherapy   Antibiotics:  Completed all antibiotics 9/23   Subjective: Complains of bilateral lower extremity pain and has noticed some redness of right ankle region. Denies chest pain or dyspnea.   Objective: Filed Vitals:   10/05/14 1430 10/05/14 2122 10/06/14 0535 10/06/14 1410  BP: 92/65 101/47 89/61 109/59  Pulse: 84 100 98 96  Temp: 97.5 F (36.4 C) 98 F (36.7 C) 97.7 F (36.5 C) 98.4 F (36.9 C)  TempSrc: Oral Oral Oral Oral  Resp: 18 18 18 18   Height:      SpO2: 96% 93% 91% 92%    Intake/Output Summary (Last 24 hours) at 10/06/14 1458 Last data filed at 10/06/14 1412  Gross per 24 hour  Intake   1200 ml  Output   1250 ml  Net    -50 ml   Filed Weights     Exam:  General exam: Moderately built and morbidly obese female lying comfortably supine in bed. Undergoing hydrotherapy by PT this morning.  Respiratory system: Clear. No increased work of breathing. Cardiovascular system: S1 & S2 heard, RRR. No JVD, murmurs, gallops, clicks. 2+ pitting bilateral leg edema/anasarca.telemetry: A. fib with controlled ventricular rate/on demand pacing.  Gastrointestinal system: Abdomen is nondistended, soft and nontender. Normal bowel sounds heard. Central nervous system: Alert and oriented. No focal neurological deficits. Extremities: Symmetric 5 x 5 power.Multiple bilateral lower extremity wounds-details as per PT note 10/7. Patient  does have an area of mild erythema and  increased warmth of right lower leg-monitor closely.   Data Reviewed: Basic Metabolic Panel:  Recent Labs Lab 10/01/14 0608 10/02/14 0445 10/03/14 0530 10/04/14 0400 10/05/14 0440 10/06/14 0500  NA 130* 131* 135* 132* 135* 133*  K 3.8 3.6* 3.7 3.3* 3.5* 3.5*  CL 91* 93* 96 93* 95* 92*  CO2 25 27 28 28 30 30   GLUCOSE 165* 169* 162* 180* 125* 170*  BUN 102* 105* 104* 100* 98* 99*  CREATININE 2.77* 2.75* 2.70* 2.55* 2.52* 2.47*  CALCIUM 7.0* 7.0* 7.1* 7.2* 7.3* 7.4*  MG 1.9 2.0  --  2.0 2.0 1.9  PHOS 5.1* 5.0* 5.0* 4.8* 4.6 4.6   Liver Function Tests:  Recent Labs Lab 10/02/14 0445 10/03/14 0530 10/04/14 0400 10/05/14 0440 10/06/14 0500  ALBUMIN 1.3* 1.3* 1.3* 1.4* 1.4*   No results found for this basename: LIPASE, AMYLASE,  in the last 168 hours No results found for this basename: AMMONIA,  in the last 168 hours CBC:  Recent Labs Lab 09/30/14 0520  WBC 7.4  HGB 8.2*  HCT 24.9*  MCV 98.4  PLT 192   Cardiac Enzymes: No results found for this basename: CKTOTAL, CKMB, CKMBINDEX, TROPONINI,  in the last 168 hours BNP (last 3 results)  Recent Labs  09/30/14 0520  PROBNP 8120.0*   CBG:  Recent Labs Lab 10/05/14 1126 10/05/14 1637 10/05/14 2108 10/06/14 0547 10/06/14 1109  GLUCAP 120* 155* 190* 182* 181*    No results found for this or any previous visit (from the past 240 hour(s)).      Studies: No results found.      Scheduled Meds: . amiodarone  400 mg Oral Daily  . calcitRIOL  0.25 mcg Oral BID  . calcium-vitamin D  1 tablet Oral BID  . collagenase   Topical TID  . febuxostat  80 mg Oral Daily  . insulin aspart  0-15 Units Subcutaneous TID WC  . insulin aspart  0-5 Units Subcutaneous QHS  . insulin aspart  2 Units Subcutaneous TID WC  . insulin glargine  22 Units Subcutaneous QHS  . iron polysaccharides  150 mg Oral BID  . levothyroxine  125 mcg Oral QAC breakfast  . linagliptin  5 mg Oral Daily  . metolazone  2.5 mg Oral QODAY    . multivitamin with minerals  1 tablet Oral Daily  . potassium chloride  20 mEq Oral BID  . saccharomyces boulardii  250 mg Oral BID  . sodium chloride  3 mL Intravenous Q12H  . torsemide  60 mg Oral BID  . 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  Vernell Leep, MD, FACP, FHM. [facility] Pager [phone]  If 7PM-7AM, please contact night-coverage [email or web address] Password [email or web address] 10/06/2014, 2:58 PM    LOS: 27 days

## 2014-10-07 ENCOUNTER — Telehealth: Payer: Self-pay | Admitting: "Endocrinology

## 2014-10-07 ENCOUNTER — Ambulatory Visit: Payer: Medicare Other | Admitting: "Endocrinology

## 2014-10-07 DIAGNOSIS — L97919 Non-pressure chronic ulcer of unspecified part of right lower leg with unspecified severity: Secondary | ICD-10-CM

## 2014-10-07 DIAGNOSIS — L97929 Non-pressure chronic ulcer of unspecified part of left lower leg with unspecified severity: Secondary | ICD-10-CM

## 2014-10-07 LAB — CBC
HCT: 27.5 % — ABNORMAL LOW (ref 36.0–46.0)
HEMOGLOBIN: 8.8 g/dL — AB (ref 12.0–15.0)
MCH: 31.4 pg (ref 26.0–34.0)
MCHC: 32 g/dL (ref 30.0–36.0)
MCV: 98.2 fL (ref 78.0–100.0)
Platelets: 300 10*3/uL (ref 150–400)
RBC: 2.8 MIL/uL — AB (ref 3.87–5.11)
RDW: 20 % — ABNORMAL HIGH (ref 11.5–15.5)
WBC: 8.1 10*3/uL (ref 4.0–10.5)

## 2014-10-07 LAB — RENAL FUNCTION PANEL
Albumin: 1.3 g/dL — ABNORMAL LOW (ref 3.5–5.2)
Anion gap: 13 (ref 5–15)
BUN: 101 mg/dL — AB (ref 6–23)
CO2: 28 mEq/L (ref 19–32)
CREATININE: 2.47 mg/dL — AB (ref 0.50–1.10)
Calcium: 7.4 mg/dL — ABNORMAL LOW (ref 8.4–10.5)
Chloride: 94 mEq/L — ABNORMAL LOW (ref 96–112)
GFR calc Af Amer: 22 mL/min — ABNORMAL LOW (ref >90)
GFR calc non Af Amer: 19 mL/min — ABNORMAL LOW (ref >90)
Glucose, Bld: 146 mg/dL — ABNORMAL HIGH (ref 70–99)
PHOSPHORUS: 4.6 mg/dL (ref 2.3–4.6)
POTASSIUM: 3.7 meq/L (ref 3.7–5.3)
Sodium: 135 mEq/L — ABNORMAL LOW (ref 137–147)

## 2014-10-07 LAB — PROTIME-INR
INR: 3.75 — ABNORMAL HIGH (ref 0.00–1.49)
Prothrombin Time: 37.1 seconds — ABNORMAL HIGH (ref 11.6–15.2)

## 2014-10-07 LAB — GLUCOSE, CAPILLARY
GLUCOSE-CAPILLARY: 217 mg/dL — AB (ref 70–99)
Glucose-Capillary: 162 mg/dL — ABNORMAL HIGH (ref 70–99)
Glucose-Capillary: 151 mg/dL — ABNORMAL HIGH (ref 70–99)
Glucose-Capillary: 154 mg/dL — ABNORMAL HIGH (ref 70–99)

## 2014-10-07 MED ORDER — HYDROMORPHONE HCL 1 MG/ML IJ SOLN
1.0000 mg | INTRAMUSCULAR | Status: DC | PRN
  Administered 2014-10-07 – 2014-10-08 (×3): 1 mg via INTRAVENOUS
  Filled 2014-10-07 (×4): qty 1

## 2014-10-07 NOTE — Progress Notes (Addendum)
Physical Therapy Treatment Patient Details Name: [name] MRN: [id number] DOB: 09-04-1950 Today's Date: 10/07/2014    History of Present Illness 64 y.o. female  with history of fibromyalgia, hypothyroidism, solitary kidney, hypothyroidism, diabetes, nonischemic cardiomyopathy with ejection fraction of 35%, atrial fibrillation who presented to the emergency department with left-sided lower leg pain. She was admitted on 9/10 w/ recurrent Lower extremity cellulitis. PCCM asked to see in consult on 9/11 for persistent hypotension and worsening renal failure.  She had a previous admission for LE cellulitis in 07/2014 followed by [facility].  Pt had returned home with HHPT prior to this admission.     PT Comments    Pt admitted with above. Pt currently with functional limitations due to strength, balance and endurance deficits. Pt will benefit from skilled PT to increase their independence and safety with mobility to allow discharge to the venue listed below.   Follow Up Recommendations  LTACH;SNF;Supervision/Assistance - 24 hour     Equipment Recommendations  None recommended by PT    Recommendations for Other Services       Precautions / Restrictions Precautions Precautions: Fall Restrictions Weight Bearing Restrictions: No Other Position/Activity Restrictions: Started OT session alone and was joined by PT 1/2 way through to attempt some bed mobility.    Mobility  Bed Mobility Overal bed mobility: Needs Assistance;+2 for physical assistance             General bed mobility comments: Pt refused EOB today b/c pain to high from recent wound debridement.  Talked to her at length about the need to get up and sit on EOB for pressure relief and strength.  Transfers                    Ambulation/Gait                 Stairs            Wheelchair Mobility    Modified Rankin (Stroke Patients Only)       Balance                                    Cognition Arousal/Alertness: Awake/alert Behavior During Therapy: Anxious Overall Cognitive Status: Within Functional Limits for tasks assessed                      Exercises General Exercises - Upper Extremity Shoulder Flexion: AROM;Strengthening;10 reps;Supine (manual resistance.) Shoulder ABduction: AROM;Both;Strengthening;10 reps;Supine (manual resistance given) Shoulder ADduction: AROM;Strengthening;Both;10 reps;Supine (manual resistance given) Shoulder Horizontal ABduction: AROM;Strengthening;Both;10 reps;Supine;Bar weights/barbell Shoulder Horizontal ADduction: AROM;Strengthening;Both;10 reps;Supine (manual resistance) Elbow Flexion: Strengthening;Both;10 reps;Supine Elbow Extension: AROM;Strengthening;Both;10 reps;Supine (manual resistance) General Exercises - Lower Extremity Ankle Circles/Pumps: AROM;Strengthening;Both;Supine;20 reps Quad Sets: AROM;Both;Supine;15 reps Gluteal Sets: AROM;Both;Supine;15 reps Heel Slides: Both;10 reps;Supine;AAROM;Other (comment) Hip ABduction/ADduction: AAROM;Both;10 reps;Supine Straight Leg Raises: AAROM;Both;Supine;10 reps;Other (comment) Other Exercises Other Exercises: Pt performed 15 reps pushing with feet against bed.  Pt also did 10 towel squeezes.      General Comments General comments (skin integrity, edema, etc.): PT/OT used bed to place in Reverse Trendelenberg to perform LE exercises pushing on bed.  Then placed bed in trendelenburg and pt pulled herself up in the bed.  Then left the pt in 75 degree chair position.      Pertinent Vitals/Pain Pain Assessment: 0-10 Pain Score: 5  Faces Pain Scale: Hurts even more Pain Location: B LES with  movement Pain Descriptors / Indicators: Aching;Sore Pain Intervention(s): Limited activity within patient's tolerance;Monitored during session;Premedicated before session;Repositioned VSS    Home Living                      Prior Function            PT Goals  (current goals can now be found in the care plan section) Acute Rehab PT Goals Patient Stated Goal: To get out of this bed Time For Goal Achievement: 10/14/14 Progress towards PT goals: Progressing toward goals    Frequency  Min 2X/week    PT Plan Current plan remains appropriate    Co-evaluation PT/OT/SLP Co-Evaluation/Treatment: Yes Reason for Co-Treatment: For patient/therapist safety PT goals addressed during session: Strengthening/ROM OT goals addressed during session: Strengthening/ROM     End of Session   Activity Tolerance: Patient limited by fatigue;Patient limited by pain Patient left: in bed;with call bell/phone within reach;with family/visitor present     Time: 6314-9702 PT Time Calculation (min): 36 min  Charges:  $Therapeutic Exercise: 8-22 mins                    G Codes:      WhiteArrie Aran F 10/21/14, 2:02 PM [employer] Acute Rehabilitation [phone] (pager)

## 2014-10-07 NOTE — Progress Notes (Signed)
Occupational Therapy Treatment Patient Details Name: [name] MRN: [id number] DOB: [birth year] Today's Date: 10/07/2014    History of present illness 64 y.o. female  with history of fibromyalgia, hypothyroidism, solitary kidney, hypothyroidism, diabetes, nonischemic cardiomyopathy with ejection fraction of 35%, atrial fibrillation who presented to the emergency department with left-sided lower leg pain. She was admitted on 9/10 w/ recurrent Lower extremity cellulitis. PCCM asked to see in consult on 9/11 for persistent hypotension and worsening renal failure.  She had a previous admission for LE cellulitis in 07/2014 followed by [facility].  Pt had returned home with HHPT prior to this admission.    OT comments  Pt progressing slowly with UE strengthening.  Pt very adamant about not getting up today following wound care.  Spoke with her at length about the need to get out of bed more often to get more pressure relief.  Pt has not been out of bed in almost a week.  Pt stated she would think about it.  Sister present for some of therapy and takes control of session when it comes to what pt should and should not be doing.  Follow Up Recommendations  LTACH;SNF    Equipment Recommendations  None recommended by OT    Recommendations for Other Services      Precautions / Restrictions Precautions Precautions: Fall Restrictions Weight Bearing Restrictions: No Other Position/Activity Restrictions: Started OT session alone and was joined by PT 1/2 way through to attempt some bed mobility.       Mobility Bed Mobility Overal bed mobility: Needs Assistance;+2 for physical assistance             General bed mobility comments: Pt refused EOB today b/c pain to high from recent wound debridement.  Talked to her at length about the need to get up and sit on EOB for pressure relief and strength.  Transfers                      Balance                                    ADL                                                Vision                     Perception     Praxis      Cognition                             Extremity/Trunk Assessment               Exercises General Exercises - Upper Extremity Shoulder Flexion: AROM;Strengthening;10 reps;Supine (manual resistance.) Shoulder ABduction: AROM;Both;Strengthening;10 reps;Supine (manual resistance given) Shoulder ADduction: AROM;Strengthening;Both;10 reps;Supine (manual resistance given) Shoulder Horizontal ABduction: AROM;Strengthening;Both;10 reps;Supine;Bar weights/barbell Shoulder Horizontal ADduction: AROM;Strengthening;Both;10 reps;Supine (manual resistance) Elbow Flexion: Strengthening;Both;10 reps;Supine Elbow Extension: AROM;Strengthening;Both;10 reps;Supine (manual resistance)   Shoulder Instructions       General Comments      Pertinent Vitals/ Pain       Pain Assessment: 0-10 Pain Score: 5  Faces Pain Scale: Hurts even more Pain Location: B LEs with movement Pain  Descriptors / Indicators: Aching;Sore Pain Intervention(s): Premedicated before session;Repositioned;Monitored during session;Limited activity within patient's tolerance  Home Living                                          Prior Functioning/Environment              Frequency Min 2X/week     Progress Toward Goals  OT Goals(current goals can now be found in the care plan section)  Progress towards OT goals: Progressing toward goals  Acute Rehab OT Goals Patient Stated Goal: To get out of this bed OT Goal Formulation: With patient Time For Goal Achievement: 10/13/14 Potential to Achieve Goals: Good ADL Goals Pt Will Perform Grooming: sitting;with set-up;with supervision Pt Will Perform Upper Body Bathing: with mod assist;sitting Pt Will Perform Lower Body Bathing: with mod assist;bed level Additional ADL Goal #1: Pt will move to EOB  sitting with min A in prep for BADL activity  Additional ADL Goal #2: Pt will move sit to stand with mod A +2 in prep for toilet transfers.   Plan Discharge plan remains appropriate    Co-evaluation    PT/OT/SLP Co-Evaluation/Treatment: Yes Reason for Co-Treatment: For patient/therapist safety PT goals addressed during session: Mobility/safety with mobility;Strengthening/ROM OT goals addressed during session: Strengthening/ROM      End of Session     Activity Tolerance Patient tolerated treatment well   Patient Left in bed;with call bell/phone within reach;with family/visitor present   Nurse Communication Mobility status        Time: 1255-1330 OT Time Calculation (min): 35 min  Charges: OT General Charges $OT Visit: 1 Procedure OT Treatments $Therapeutic Exercise: 23-37 mins  Glenford Peers 10/07/2014, 1:40 PM [phone]

## 2014-10-07 NOTE — Telephone Encounter (Signed)
Routed to provider

## 2014-10-07 NOTE — Progress Notes (Signed)
ANTICOAGULATION CONSULT NOTE - Follow Up Consult  Pharmacy Consult for coumadin Indication: atrial fibrillation  Allergies  Allergen Reactions  . Ivp Dye [Iodinated Diagnostic Agents] Shortness Of Breath    Turn red, can't breathe  . Betadine [Povidone Iodine]   . Diphenhydramine Hcl Swelling    In hands and eyes  . Fish Allergy Nausea And Vomiting  . Iohexol      Desc: PT TURNS RED AND WHEEZING   . Penicillins Other (See Comments)    Resp arrest as child. Tolerates cephalosporins.  . Povidone-Iodine Other (See Comments)    Wheezing, turn red, can't breath, and blisters  . Promethazine Hcl Other (See Comments)    Low Blood Pressure  . Tape     Blisters, can use paper tape for short periods  . Clindamycin Hives and Rash    wheezing  . Iodine Rash  . Morphine Sulfate Nausea And Vomiting and Rash  . Primaxin [Imipenem] Rash    Patient Measurements: Height: 5' 6.93" (170 cm) Weight: 306 lb (138.801 kg) (Via Bed Scale.  Accuracy uncertain.) IBW/kg (Calculated) : 61.44  Vital Signs: Temp: 97.9 F (36.6 C) (10/08 0538) Temp Source: Oral (10/08 0538) BP: 92/41 mmHg (10/08 0538) Pulse Rate: 101 (10/08 0538)  Labs:  Recent Labs  10/05/14 0440 10/06/14 0500 10/07/14 0615  HGB  --   --  8.8*  HCT  --   --  27.5*  PLT  --   --  300  LABPROT 34.3* 36.9* 37.1*  INR 3.40* 3.73* 3.75*  CREATININE 2.52* 2.47* 2.47*    Estimated Creatinine Clearance: 32.7 ml/min (by C-G formula based on Cr of 2.47).  Assessment: Patient is a 64 y.o F on coumadin for Afib. INR (3.75) is still supratherapeutic today, though coumadin has been on hold since Tuesday. No bleeding documented. No drug interaction identified except for Amiodarone, she was on it PTA, and now dose has been decreased to 400 mg daily.   Home dose: 5 mg on Sunday, Tuesday, Thursday and Saturday and 2.5 mg all other days.  Goal of Therapy:  INR 2-3   Plan:  - Continue to hold coumadin today - f/u PT/INR in  AM  Maryanna Shape, PharmD, BCPS  Clinical Pharmacist  Pager: [phone]   10/07/2014,9:09 AM

## 2014-10-07 NOTE — Progress Notes (Signed)
Pt refused CPAP at this time.

## 2014-10-07 NOTE — Progress Notes (Signed)
Physical Therapy Wound Treatment Patient Details  Name: [name] MRN: [id number] Date of Birth: 1950/01/18  Today's Date: 10/07/2014 Time: 8242-3536 Time Calculation (min): 78 min  Subjective  Subjective: Pt reports her skin blisters with all dressings/adhesive except tegaderm (including pink foam) Patient and Family Stated Goals: decr pain in wounds and heal without surgery Date of Onset:  (multiple areas exact date unknown) Prior Treatments: mepitel foam, various types of tape  Pain Score:    Wound Assessment  Pressure Ulcer 09/10/14 Stage III -  Full thickness tissue loss. Subcutaneous fat may be visible but bone, tendon or muscle are NOT exposed. 3.5 X 2 (Active)  Dressing Type Moisture barrier 10/07/2014  5:30 AM  Dressing Clean;Dry;Intact 10/07/2014 12:30 AM  Dressing Change Frequency Every 5 days 09/26/2014 11:00 AM  State of Healing Early/partial granulation 09/26/2014 11:00 AM  Site / Wound Assessment Bleeding 09/29/2014  8:00 PM  % Wound base Red or Granulating 100% 09/29/2014  8:00 PM  % Wound base Yellow 75% 09/26/2014 11:00 AM  % Wound base Black 0% 09/26/2014 11:00 AM  % Wound base Other (Comment) 0% 09/26/2014 11:00 AM  Peri-wound Assessment Intact;Pink 09/26/2014 11:00 AM  Wound Length (cm) 3.5 cm 09/11/2014  8:00 AM  Wound Width (cm) 2 cm 09/11/2014  8:00 AM  Margins Unattached edges (unapproximated) 09/29/2014  8:00 PM  Drainage Amount Copious 10/07/2014  5:30 AM  Drainage Description Sanguineous 10/07/2014  5:30 AM  Treatment Cleansed;Other (Comment) 10/01/2014  9:30 AM     Pressure Ulcer 09/20/14 Deep Tissue Injury - Purple or maroon localized area of discolored intact skin or blood-filled blister due to damage of underlying soft tissue from pressure and/or shear. (Active)  Dressing Type Moisture barrier 10/07/2014  5:30 AM  Dressing Other (Comment) 10/07/2014  5:30 AM  Dressing Change Frequency Twice a day 10/01/2014  9:30 AM  State of Healing Non-healing 09/29/2014   8:04 PM  % Wound base Red or Granulating 90% 09/29/2014  8:04 PM  % Wound base Yellow 10% 09/29/2014  8:04 PM  Margins Unattached edges (unapproximated) 09/29/2014  8:04 PM  Drainage Amount Scant 09/29/2014  8:04 PM  Drainage Description Serosanguineous 09/29/2014  8:04 PM  Treatment Cleansed;Other (Comment) 09/29/2014  8:04 PM     Pressure Ulcer 09/20/14 Deep Tissue Injury - Purple or maroon localized area of discolored intact skin or blood-filled blister due to damage of underlying soft tissue from pressure and/or shear. (Active)  Dressing Type Gauze (Comment);ABD;Moist to moist;Mesh briefs;Other (Comment) 10/07/2014 11:10 AM  Dressing Changed 10/07/2014 11:10 AM  Dressing Change Frequency Other (Comment) 10/07/2014 11:10 AM  State of Healing Eschar 10/07/2014 11:10 AM  Site / Wound Assessment Yellow;Granulation tissue;Painful 10/07/2014 11:10 AM  % Wound base Red or Granulating 5% 10/07/2014 11:10 AM  % Wound base Yellow 95% 10/07/2014 11:10 AM  % Wound base Black 0% 10/07/2014 11:10 AM  % Wound base Other (Comment) 0% 10/07/2014 11:10 AM  Peri-wound Assessment Pink;Intact 10/07/2014 11:10 AM  Wound Length (cm) 4.7 cm 10/07/2014 11:10 AM  Wound Width (cm) 6.5 cm 10/07/2014 11:10 AM  Wound Depth (cm) 0.4 cm 10/07/2014 11:10 AM  Margins Unattached edges (unapproximated) 10/07/2014 11:10 AM  Drainage Amount Moderate 10/07/2014 11:10 AM  Drainage Description Serous;Odor 10/07/2014 11:10 AM  Treatment Debridement (Selective);Hydrotherapy (Pulse lavage) 10/06/2014  9:57 AM     Wound 06/02/13 Abrasion(s) Leg Left (Active)  Dressing Type Gauze (Comment);Moist to moist;ABD;Mesh briefs 10/07/2014 11:10 AM  Dressing Changed Other (Comment) 10/07/2014  5:30 AM  Dressing Status Clean;Dry;Intact 10/07/2014 11:10 AM  Dressing Change Frequency Daily 10/07/2014 11:10 AM  Site / Wound Assessment Red 10/07/2014 11:10 AM  % Wound base Red or Granulating 100% 10/07/2014 11:10 AM  % Wound base Yellow 0% 10/07/2014 11:10 AM  %  Wound base Black 0% 10/07/2014 11:10 AM  Peri-wound Assessment Intact;Edema;Erythema (blanchable) 10/07/2014 11:10 AM  Margins Unattached edges (unapproximated) 10/07/2014 11:10 AM  Closure None 10/07/2014 11:10 AM  Drainage Amount Moderate 10/07/2014 11:10 AM  Drainage Description Serous 10/07/2014 11:10 AM  Non-staged Wound Description Full thickness 10/07/2014 11:10 AM  Treatment Cleansed 10/06/2014  9:57 AM     Wound / Incision (Open or Dehisced) 07/24/14 Incision - Open Leg Left (Active)     Wound / Incision (Open or Dehisced) 07/24/14 Incision - Open Thigh Right scabbed over circular area (Active)     Wound / Incision (Open or Dehisced) 07/29/14 Other (Comment) Thigh Left;Medial HYDRO (Active)     Wound / Incision (Open or Dehisced) 08/02/14 Other (Comment) Thigh Right;Medial Hydro, R medial thigh wound (Active)     Wound / Incision (Open or Dehisced) 09/10/14 Other (Comment) Thigh Medial;Left;Posterior;Proximal (Active)  Dressing Type Gauze (Comment);ABD;Mesh briefs;Moist to moist;Other (Comment) 10/07/2014 11:10 AM  Dressing Changed Changed 10/07/2014  5:30 AM  Dressing Status Clean;Dry;Intact 10/07/2014 11:10 AM  Dressing Change Frequency Other (Comment) 10/07/2014 11:10 AM  Site / Wound Assessment Black;Yellow;Pink;Granulation tissue;Painful 10/07/2014 11:10 AM  % Wound base Red or Granulating 5% 10/07/2014 11:10 AM  % Wound base Yellow 45% 10/07/2014 11:10 AM  % Wound base Black 50% 10/07/2014 11:10 AM  Peri-wound Assessment Edema;Erythema (non-blanchable);Maceration;Pink 10/07/2014 11:10 AM  Wound Length (cm) 7.9 cm 10/07/2014 11:10 AM  Wound Width (cm) 9 cm 10/07/2014 11:10 AM  Wound Depth (cm) 0.2 cm 10/07/2014 11:10 AM  Margins Unattached edges (unapproximated) 10/07/2014 11:10 AM  Closure None 10/07/2014 11:10 AM  Drainage Amount Copious 10/07/2014 11:10 AM  Drainage Description Serous;Odor 10/07/2014 11:10 AM  Non-staged Wound Description Full thickness 10/07/2014 11:10 AM  Treatment  Cleansed;Packing (Dry gauze) 10/07/2014  5:30 AM     Wound / Incision (Open or Dehisced) 09/18/14 Other (Comment) Hip Right Black eschar tissue surrounded by pink tissue (Active)  Dressing Type Gauze (Comment);ABD;Moist to moist;Mesh briefs 10/07/2014 11:10 AM  Dressing Changed Changed 10/07/2014  5:30 AM  Dressing Status Clean;Dry;Intact 10/07/2014 11:10 AM  Dressing Change Frequency Other (Comment) 10/07/2014 11:10 AM  Site / Wound Assessment Granulation tissue;Yellow;Pink 10/07/2014 11:10 AM  % Wound base Red or Granulating 5% 10/07/2014 11:10 AM  % Wound base Yellow 60% 10/07/2014 11:10 AM  % Wound base Black 35% 10/07/2014 11:10 AM  % Wound base Other (Comment) 0% 10/07/2014 11:10 AM  Peri-wound Assessment Edema;Pink;Erythema (non-blanchable);Induration 10/07/2014 11:10 AM  Wound Length (cm) 8.4 cm 10/07/2014 11:10 AM  Wound Width (cm) 6.8 cm 10/07/2014 11:10 AM  Wound Depth (cm) 0.2 cm 10/07/2014 11:10 AM  Margins Unattached edges (unapproximated) 10/07/2014 11:10 AM  Closure None 10/07/2014 11:10 AM  Drainage Amount Moderate 10/07/2014 11:10 AM  Drainage Description Serous;Odor 10/07/2014 11:10 AM  Non-staged Wound Description Full thickness 10/07/2014 11:10 AM  Treatment Other (Comment) 10/07/2014  5:30 AM     Incision 05/05/12 Perineum Other (Comment) (Active)   Hydrotherapy Pulsed lavage therapy - wound location: Rt lateral hip, Lt lateral thigh, Lt proximal-posteriomedial thigh Pulsed Lavage with Suction (psi): 4 psi (to 8) Pulsed Lavage with Suction - Normal Saline Used: 2000 mL Pulsed Lavage Tip: Tip with splash shield Selective Debridement Selective Debridement -  Location: Rt lateral hip, Lt lateral thigh, Lt proximal-posteriomedial thigh Selective Debridement - Tools Used: Forceps;Scalpel;Scissors Selective Debridement - Tissue Removed: yellow and black/brown eschar; yellow slough   Wound Assessment and Plan  Wound Therapy - Assess/Plan/Recommendations Wound Therapy - Clinical  Statement: Megan, PA and Dawn, [place] RN present to inspect wounds and problem-solve how to keep dressings in place without use of adhesive or tegaderm (as wounds are remaining too moist). Decided to try using mesh "tube" (made from mesh briefs) to hold bandages over her hip/thighs. Placed interdry material along inner thigh under the mesh to protect inner thighs. Wounds with incr odor this date. Pt more painful and tolerated less pressure with pulse lavage and less debridement.  Wound Therapy - Functional Problem List: painful wounds limiting her mobility tolerance Factors Delaying/Impairing Wound Healing: Diabetes Mellitus;Infection - systemic/local;Immobility;Multiple medical problems;Vascular compromise Hydrotherapy Plan: Debridement;Dressing change;Patient/family education;Pulsatile lavage with suction Wound Therapy - Frequency: 6X / week Wound Therapy - Current Recommendations: WOC nurse Wound Therapy - Follow Up Recommendations: Other (comment) (LTACH) Wound Plan: see above  Wound Therapy Goals- Improve the function of patient's integumentary system by progressing the wound(s) through the phases of wound healing (inflammation - proliferation - remodeling) by: Decrease Necrotic Tissue to: <75% Decrease Necrotic Tissue - Progress: Not progressing (though pt let more debridement occur today) Increase Granulation Tissue to: >25% Increase Granulation Tissue - Progress: Mot progressing Improve Drainage Characteristics: Min Improve Drainage Characteristics - Progress: Not progressing  Goals will be updated until maximal potential achieved or discharge criteria met.  Discharge criteria: when goals achieved, discharge from hospital, MD decision/surgical intervention, no progress towards goals, refusal/missing three consecutive treatments without notification or medical reason.  GP     Kyzer Blowe, Tessie Fass 10/07/2014, 11:21 AM 10/07/2014  Donnella Sham, PT [phone]   (pager)

## 2014-10-07 NOTE — Progress Notes (Signed)
NUTRITION FOLLOW UP  INTERVENTION:  Continue snacks TID  RD to follow for nutrition care plan  NUTRITION DIAGNOSIS: Increased nutrient needs related to wound healing as evidenced by estimated nutrition needs, ongoing  Goal: Pt to meet >/= 90% of their estimated nutrition needs, progressing  Monitor:  PO intake, weight, labs, I/O's  ASSESSMENT: 64 y.o. Female with history of fibromyalgia, hypothyroidism, solitary kidney, hypothyroidism, diabetes, nonischemic cardiomyopathy with ejection fraction of 35%, atrial fibrillation who presented to ED with left-sided lower leg pain.  Patient having PT treatment upon RD visit.  PO intake now variable at 30-100% per flowsheet records.  Increased nutrient needs ongoing given wound presence.  Snacks ordered TID.  Height: Ht Readings from Last 1 Encounters:  09/09/14 5' 6.93" (1.7 m)    Weight: Wt Readings from Last 1 Encounters:  10/06/14 306 lb (138.801 kg)    BMI:  Body mass index is 48.03 kg/(m^2).  Estimated Nutritional Needs: Kcal: 1700-1900 Protein: 100-110 gm Fluid: 1.7-1.9 L  Skin:  Left thigh unstageable wound Left posterior thigh unstageable wound Right hip unstageable wound Left outer calf with partial thickness stasis ulcers Stage II left buttock wound  Diet Order: Heart Healthy/Carbohydrate Modified    Intake/Output Summary (Last 24 hours) at 10/07/14 1010 Last data filed at 10/07/14 0539  Gross per 24 hour  Intake    240 ml  Output   1050 ml  Net   -810 ml   Labs:   Recent Labs Lab 10/04/14 0400 10/05/14 0440 10/06/14 0500 10/07/14 0615  NA 132* 135* 133* 135*  K 3.3* 3.5* 3.5* 3.7  CL 93* 95* 92* 94*  CO2 28 30 30 28   BUN 100* 98* 99* 101*  CREATININE 2.55* 2.52* 2.47* 2.47*  CALCIUM 7.2* 7.3* 7.4* 7.4*  MG 2.0 2.0 1.9  --   PHOS 4.8* 4.6 4.6 4.6  GLUCOSE 180* 125* 170* 146*    CBG (last 3)   Recent Labs  10/06/14 1610 10/06/14 2206 10/07/14 0510  GLUCAP 175* 177* 217*     Scheduled Meds: . amiodarone  400 mg Oral Daily  . calcitRIOL  0.25 mcg Oral BID  . calcium-vitamin D  1 tablet Oral BID  . collagenase   Topical TID  . febuxostat  80 mg Oral Daily  . insulin aspart  0-15 Units Subcutaneous TID WC  . insulin aspart  0-5 Units Subcutaneous QHS  . insulin aspart  2 Units Subcutaneous TID WC  . insulin glargine  22 Units Subcutaneous QHS  . iron polysaccharides  150 mg Oral BID  . levothyroxine  125 mcg Oral QAC breakfast  . linagliptin  5 mg Oral Daily  . metolazone  2.5 mg Oral QODAY  . multivitamin with minerals  1 tablet Oral Daily  . potassium chloride  20 mEq Oral BID  . saccharomyces boulardii  250 mg Oral BID  . sodium chloride  3 mL Intravenous Q12H  . torsemide  60 mg Oral BID  . Warfarin - Pharmacist Dosing Inpatient   Does not apply q1800    Continuous Infusions:   Past Medical History  Diagnosis Date  . Personal history of other diseases of circulatory system   . 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 Chronic lymphocytic thyroiditis   . Morbid obesity   . Other chronic pulmonary heart diseases   . Obstructive sleep apnea (adult) (pediatric)   . Other and unspecified hyperlipidemia   . Fibromyalgia   . Goiter   .  Thyroiditis, autoimmune   . Hypothyroidism, acquired, autoimmune   . Solitary kidney, acquired   . Fatigue   . Hyperparathyroidism, secondary   . Vitamin D deficiency disease   . Hyperparathyroidism   . H/O varicose veins 03/27/12  . Type II or unspecified type diabetes mellitus without mention of complication, not stated as uncontrolled   . Diabetes mellitus type II   . Infections of kidney 03/27/12  . History of measles, mumps, or rubella 03/27/12  . Tubal pregnancy 12/23/76  . Unspecified essential hypertension     20 yrs ago  . Blood transfusion 1977    [facility]  . Complication of anesthesia 2010    hard to wake up after knee surgery  . Anemia   . Cardiomyopathy, nonischemic     EF 45 %   . Endometrial hyperplasia with atypia 05/05/12  . Diastolic HF (heart failure) 11/05/2012  . A-fib   . Echocardiogram abnormal 07/15/12    severe MR,mod to severe TR,mod to severe PA hypertension  . Pulmonary arterial hypertension   . PAF (paroxysmal atrial fibrillation), 1st episode since DCCV in Jan/14 03/19/2013  . Ulcers of both lower extremities, small, now with cellulitis 06/05/2013    Past Surgical History  Procedure Laterality Date  . Cholecystectomy    . Pacemaker placement  02/26/11    Medtronic Adapta  . Knee surgery    . Resection duplicate left kidney    . 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 Hernia repair  1999    ruptured ;  . D& c with hysteroscopy & cervical polypectomy  05/05/12  . Cardioversion N/A 02/06/2013    Successful  . Iud removal  01/30/13  . Ectopic pregnancy surgery  1978  . Cardioversion N/A 06/08/2013    Procedure: CARDIOVERSION;  Surgeon: Sanda Klein, MD;  Location: [place];  Service: Cardiovascular;  Laterality: N/A;  Room [id number]  . Cardioversion N/A 02/01/2014    Procedure: CARDIOVERSION;  Surgeon: Sanda Klein, MD;  Location: [place];  Service: Cardiovascular;  Laterality: N/A;  . Cardioversion N/A 06/02/2014    Procedure: CARDIOVERSION;  Surgeon: Sanda Klein, MD;  Location: [place];  Service: Cardiovascular;  Laterality: N/A;    Arthur Holms, RD, LDN Pager #: [phone] After-Hours Pager #: [phone]

## 2014-10-07 NOTE — Progress Notes (Signed)
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 female with past medical history of NICM with ICD, chronic systolic CHF (EF 71-69% on 09/12/14), solitary right kidney with CKD Stage IV, hypothyroidism on replacement, pulmonary hypertension, PAF on coumadin, morbid obesity, fibromyalgia, insulin-dependent Type II DM, and OSA admitted and treated for septic shock due to left lower leg cellulitis.   SUBJECTIVE: Pt upset about pain with dressing changes, no acute breathing problems.  OBJECTIVE Filed Vitals:   10/06/14 0535 10/06/14 1410 10/06/14 2300 10/07/14 0538  BP: 89/61 109/59  92/41  Pulse: 98 96  101  Temp: 97.7 F (36.5 C) 98.4 F (36.9 C)  97.9 F (36.6 C)  TempSrc: Oral Oral  Oral  Resp: 18 18  18   Height:      Weight:   306 lb (138.801 kg)   SpO2: 91% 92%  94%    Intake/Output Summary (Last 24 hours) at 10/07/14 1056 Last data filed at 10/07/14 0539  Gross per 24 hour  Intake    240 ml  Output   1050 ml  Net   -810 ml   Filed Weights   10/06/14 2300  Weight: 306 lb (138.801 kg)    PHYSICAL EXAM General: Well developed, well nourished, female in no acute distress. Head: Normocephalic, atraumatic.  Neck: Supple without bruits, JVD  difficult to assess secondary to body habitus. Lungs:  Resp regular and unlabored, dense rales bases. Heart: Irreg R&R, S1, S2, no S3, S4, 2/6 murmur; no rub. Abdomen: Soft, non-tender, non-distended, BS + x 4.  Extremities: No clubbing, cyanosis, + edema. Erythema noted below the knees, thigh wounds dressed  Neuro: Alert and oriented X 3. Moves all extremities spontaneously. Psych: Upset due to pain with dressing changes  LABS: CBC: Recent Labs  10/07/14 0615  WBC 8.1  HGB 8.8*  HCT 27.5*  MCV 98.2  PLT 300   INR: Recent Labs  10/07/14 0615  INR 6.78*   Basic Metabolic Panel: Recent Labs  10/05/14 0440 10/06/14 0500 10/07/14 0615  NA 135* 133* 135*  K 3.5* 3.5* 3.7  CL 95* 92* 94*  CO2 30 30 28   GLUCOSE 125* 170* 146*  BUN 98* 99* 101*  CREATININE 2.52* 2.47* 2.47*  CALCIUM 7.3* 7.4* 7.4*  MG 2.0 1.9  --   PHOS 4.6 4.6 4.6   Liver Function Tests: Recent Labs  10/06/14 0500 10/07/14 0615  ALBUMIN 1.4* 1.3*   BNP: Pro B Natriuretic peptide (BNP)  Date/Time Value Ref Range Status  09/30/2014  5:20 AM 8120.0* 0 - 125 pg/mL Final  06/06/2013 12:35 PM 4452.0* 0 - 125 pg/mL Final    TELE:  Atrial fib, rate generally  well-controlled, rare paced beats, occasional fusion beats.   Current Medications:  . amiodarone  400 mg Oral Daily  . calcitRIOL  0.25 mcg Oral BID  . calcium-vitamin D  1 tablet Oral BID  . collagenase   Topical TID  . febuxostat  80 mg Oral Daily  . insulin aspart  0-15 Units Subcutaneous TID WC  . insulin aspart  0-5 Units Subcutaneous QHS  . insulin aspart  2 Units Subcutaneous TID WC  . insulin glargine  22 Units Subcutaneous QHS  . iron polysaccharides  150 mg Oral BID  . levothyroxine  125 mcg Oral QAC breakfast  . linagliptin  5 mg Oral Daily  . metolazone  2.5 mg Oral QODAY  . multivitamin with minerals  1 tablet Oral Daily  . potassium chloride  20 mEq Oral BID  . saccharomyces boulardii  250 mg Oral BID  . sodium chloride  3 mL  Intravenous Q12H  . torsemide  60 mg Oral BID  . Warfarin - Pharmacist Dosing Inpatient   Does not apply q1800      ASSESSMENT AND PLAN: 1. Acute on chronic combined systolic and diastolic heart failure. Nearly impossible to assess her volume status, although she has had a large volume diuresis. Weights as well as I/O have been inaccurate and not recorded consistently. 10/07, changed back to oral diuretics with a combination of torsemide BID and metolazone QOD. She has marked hypoalbuminemia with an albumin of 1.3 which is contributing to her anasarca.   2. Hypotension, improved. SBP high 80s-100s   3. Atrial fibrillation. The patient is maintained on warfarin per pharmacy. INR is 3.75 10/08.  4. Stage IV chronic kidney disease. BUN range 95-105 last 10 days, creatinine stable at 2.47 mg/dL today.  Otherwise, per IM. Pt may need stronger pain control during dressing changes. 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   Obesity hypoventilation syndrome-    Cardiomyopathy, nonischemic- EF 30-35% June 2014   Type 2 diabetes mellitus   Chronic combined systolic and diastolic CHF   Long term (current) use of anticoagulants   CKD (chronic kidney disease) stage 4, GFR 15-29 ml/min   PAF- recurrent despite Amiodarone and multiple cardioversions   Mitral insufficiency, moderate to severe   Chronic massive bilat LE lymphadema   Fall at home- 3 falls this summer, sound orthostatic   Hypotension   Acute on chronic renal failure   Coarse tremors   Signed, Rosaria Ferries , PA-C 10:56 AM 10/07/2014  Patient seen, examined. Available data reviewed. Agree with findings, assessment, and plan as outlined by Rosaria Ferries, PA-C. Continued good urine output now on oral Rx with metolazone and torsemide. Other issues per primary service. No plans to further intensify diuresis as much of her chronic volume  problem is related to obesity, hypoalbuminemia, etc.  Sherren Mocha, M.D. 10/07/2014 12:20 PM

## 2014-10-07 NOTE — Progress Notes (Signed)
Removed left IJ and applied petroleum gauze and 4x4.  Held pressure for 5 minutes. Pt has one small suture left in place which had a scab over it.  Will remove suture tomorrow after gauze has lubricated skin. Pt resting with call bell within reach.  Will continue to monitor. ,mec

## 2014-10-07 NOTE — Progress Notes (Signed)
PROGRESS NOTE    [name] ZES:[id number] DOB: 1950-03-04 DOA: 09/09/2014 PCP: Elby Showers, MD  HPI/Brief narrative 64 y.o. female with history of Morbid obesity, CKD 4, fibromyalgia, hypothyroidism, solitary kidney, hypothyroidism, diabetes, nonischemic cardiomyopathy with ejection fraction of 35%, atrial fibrillation who was admitted on 9/10 w/ recurrent Lower extremity cellulitis, immediately went into Septic shock- was in ICU on pressors until 9/14. Subsequently transferred to the Floor, Patient was on cefazolin from 9/14. Clinically volume overloaded with anasarca and bring diuresed per Cards.    Assessment/Plan:  1. Septic shock: Due to bilateral leg cellulitis. Required vasopressors in ICU. Resolved. 2. Multiple bilateral lower extremity wounds: Completed antibiotic course. Surgery & WOC followup appreciated. Continue hydrotherapy with PT. She will need OP follow up with plastic surgery. As per discussion with case management, if no SNF available to manage wound care, may be eligible for LTAC. Significant pain during wound care- not controlled with current meds> will change Fentanyl to Dilaudid. 3. Acute on chronic combined systolic and diastolic CHF/anasarca: Cardiology following. Difficult to assess volume status despite large volume diuresis. Weights have been inaccurate and not regularly recorded. Cardiology switched her to oral diuretics-torsemide and metolazone. Marked hypoalbuminemia also contributing to anasarca. 4. A. Fib/s/p PPM: Controlled ventricular rate. INR 3.75 10/8. Coumadin per pharmacy. 5. Hypotension: Continues to have intermittent soft blood pressures - but better. 6. 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 Continue Lantus, NovoLog mealtime and SSI. 7. Hypothyroid: Continue levothyroxine. Recent TSH 2 on 05/29/1511 8. History of gout: Stable  9. History of OHS/morbid obesity: Nightly CPAP 10. Tremors: Gabapentin  discontinued. Seems to have resolved.  11. Acute on stage IV chronic kidney disease: No baseline creatinine probably in the low 2 range. Creatinine has plateaued in the 2.4-2.5 range which may be her new baseline. Periodically follow BMP.  12. RUE swelling: Dopplers negative for DVT. Likely from anasarca. 13. Anemia: Secondary to chronic kidney disease, acute illness and iron deficiency: Stable 14. Hypokalemia: Replaced   Code Status: Full Family Communication: None at bedside Disposition Plan: LTAC vs SNF when bed available-approaching medical stability for discharge.   Consultants:  General surgery   Cardiology  Critical care medicine-signed off  Nephrology-seem to have signed off  WOC  Procedures:  Central venous catheter insertion procedure on 09/10/2014 >Will attempt to try for a peripheral IV line, then DC central line.  Hydrotherapy   Antibiotics:  Completed all antibiotics 9/23   Subjective: Complains of bilateral lower extremity pain and has noticed some redness of right ankle region-has not worsened. Denies chest pain or dyspnea. Significant pain during wound care-not controlled with current pain regimen.  Objective: Filed Vitals:   10/06/14 0535 10/06/14 1410 10/06/14 2300 10/07/14 0538  BP: 89/61 109/59  92/41  Pulse: 98 96  101  Temp: 97.7 F (36.5 C) 98.4 F (36.9 C)  97.9 F (36.6 C)  TempSrc: Oral Oral  Oral  Resp: 18 18  18   Height:      Weight:   138.801 kg (306 lb)   SpO2: 91% 92%  94%    Intake/Output Summary (Last 24 hours) at 10/07/14 1203 Last data filed at 10/07/14 0539  Gross per 24 hour  Intake    240 ml  Output   1050 ml  Net   -810 ml   Filed Weights   10/06/14 2300  Weight: 138.801 kg (306 lb)     Exam:  General exam: Moderately built and morbidly obese female lying  comfortably supine in bed.  Respiratory system: Clear. No increased work of breathing. Cardiovascular system: S1 & S2 heard, RRR. No JVD, murmurs,  gallops, clicks. 2+ pitting bilateral leg edema/anasarca.telemetry: A. fib with controlled ventricular rate/on demand AV pacing.  Gastrointestinal system: Abdomen is nondistended, soft and nontender. Normal bowel sounds heard. Central nervous system: Alert and oriented. No focal neurological deficits. Extremities: Symmetric 5 x 5 power.Multiple bilateral lower extremity wounds-details as per PT note 10/7. Patient does have an area of mild erythema and increased warmth of right lower leg- seems slightly better than 10/7.   Data Reviewed: Basic Metabolic Panel:  Recent Labs Lab 10/01/14 0608 10/02/14 0445 10/03/14 0530 10/04/14 0400 10/05/14 0440 10/06/14 0500 10/07/14 0615  NA 130* 131* 135* 132* 135* 133* 135*  K 3.8 3.6* 3.7 3.3* 3.5* 3.5* 3.7  CL 91* 93* 96 93* 95* 92* 94*  CO2 25 27 28 28 30 30 28   GLUCOSE 165* 169* 162* 180* 125* 170* 146*  BUN 102* 105* 104* 100* 98* 99* 101*  CREATININE 2.77* 2.75* 2.70* 2.55* 2.52* 2.47* 2.47*  CALCIUM 7.0* 7.0* 7.1* 7.2* 7.3* 7.4* 7.4*  MG 1.9 2.0  --  2.0 2.0 1.9  --   PHOS 5.1* 5.0* 5.0* 4.8* 4.6 4.6 4.6   Liver Function Tests:  Recent Labs Lab 10/03/14 0530 10/04/14 0400 10/05/14 0440 10/06/14 0500 10/07/14 0615  ALBUMIN 1.3* 1.3* 1.4* 1.4* 1.3*   No results found for this basename: LIPASE, AMYLASE,  in the last 168 hours No results found for this basename: AMMONIA,  in the last 168 hours CBC:  Recent Labs Lab 10/07/14 0615  WBC 8.1  HGB 8.8*  HCT 27.5*  MCV 98.2  PLT 300   Cardiac Enzymes: No results found for this basename: CKTOTAL, CKMB, CKMBINDEX, TROPONINI,  in the last 168 hours BNP (last 3 results)  Recent Labs  09/30/14 0520  PROBNP 8120.0*   CBG:  Recent Labs Lab 10/06/14 1109 10/06/14 1610 10/06/14 2206 10/07/14 0510 10/07/14 1115  GLUCAP 181* 175* 177* 217* 162*    No results found for this or any previous visit (from the past 240 hour(s)).      Studies: No results  found.      Scheduled Meds: . amiodarone  400 mg Oral Daily  . calcitRIOL  0.25 mcg Oral BID  . calcium-vitamin D  1 tablet Oral BID  . collagenase   Topical TID  . febuxostat  80 mg Oral Daily  . insulin aspart  0-15 Units Subcutaneous TID WC  . insulin aspart  0-5 Units Subcutaneous QHS  . insulin aspart  2 Units Subcutaneous TID WC  . insulin glargine  22 Units Subcutaneous QHS  . iron polysaccharides  150 mg Oral BID  . levothyroxine  125 mcg Oral QAC breakfast  . linagliptin  5 mg Oral Daily  . metolazone  2.5 mg Oral QODAY  . multivitamin with minerals  1 tablet Oral Daily  . potassium chloride  20 mEq Oral BID  . saccharomyces boulardii  250 mg Oral BID  . sodium chloride  3 mL Intravenous Q12H  . torsemide  60 mg Oral BID  . 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    Vernell Leep, MD, FACP, FHM. [facility] Pager [phone]  If 7PM-7AM, please contact night-coverage [email or web address] Password [email or web address] 10/07/2014, 12:03 PM    LOS: 28 days

## 2014-10-08 LAB — GLUCOSE, CAPILLARY
GLUCOSE-CAPILLARY: 153 mg/dL — AB (ref 70–99)
GLUCOSE-CAPILLARY: 190 mg/dL — AB (ref 70–99)
Glucose-Capillary: 106 mg/dL — ABNORMAL HIGH (ref 70–99)
Glucose-Capillary: 186 mg/dL — ABNORMAL HIGH (ref 70–99)

## 2014-10-08 MED ORDER — HYDROMORPHONE HCL 1 MG/ML IJ SOLN
2.0000 mg | Freq: Every day | INTRAMUSCULAR | Status: DC
  Administered 2014-10-08: 1 mg via INTRAVENOUS
  Administered 2014-10-09 – 2014-10-26 (×17): 2 mg via INTRAVENOUS
  Filled 2014-10-08 (×17): qty 2

## 2014-10-08 MED ORDER — DOCUSATE SODIUM 100 MG PO CAPS
100.0000 mg | ORAL_CAPSULE | Freq: Two times a day (BID) | ORAL | Status: DC | PRN
  Administered 2014-10-08 – 2014-10-11 (×4): 100 mg via ORAL
  Filled 2014-10-08 (×4): qty 1

## 2014-10-08 MED ORDER — HYDROMORPHONE HCL 1 MG/ML IJ SOLN
1.0000 mg | INTRAMUSCULAR | Status: DC | PRN
  Administered 2014-10-08 – 2014-10-26 (×42): 1 mg via INTRAVENOUS
  Filled 2014-10-08 (×43): qty 1

## 2014-10-08 MED ORDER — SODIUM CHLORIDE 0.9 % IJ SOLN
10.0000 mL | INTRAMUSCULAR | Status: DC | PRN
  Administered 2014-10-20 – 2014-10-21 (×3): 10 mL
  Administered 2014-10-26: 20 mL

## 2014-10-08 NOTE — Progress Notes (Signed)
PT Cancellation Note  Patient Details Name: NAVA SONG MRN: [id number] DOB: [birth year]   Cancelled Treatment:    Reason Eval/Treat Not Completed: Pain limiting ability to participate;Fatigue/lethargy limiting ability to participate (Pt refused)   Denice Paradise 10/08/2014, 12:39 PM Castor Gittleman,PT Acute Rehabilitation [phone] (pager)

## 2014-10-08 NOTE — Progress Notes (Signed)
ANTICOAGULATION CONSULT NOTE - Follow Up Consult  Pharmacy Consult for Coumadin Indication: atrial fibrillation  Allergies  Allergen Reactions  . Ivp Dye [Iodinated Diagnostic Agents] Shortness Of Breath    Turn red, can't breathe  . Betadine [Povidone Iodine]   . Diphenhydramine Hcl Swelling    In hands and eyes  . Fish Allergy Nausea And Vomiting  . Iohexol      Desc: PT TURNS RED AND WHEEZING   . Penicillins Other (See Comments)    Resp arrest as child. Tolerates cephalosporins.  . Povidone-Iodine Other (See Comments)    Wheezing, turn red, can't breath, and blisters  . Promethazine Hcl Other (See Comments)    Low Blood Pressure  . Tape     Blisters, can use paper tape for short periods  . Clindamycin Hives and Rash    wheezing  . Iodine Rash  . Morphine Sulfate Nausea And Vomiting and Rash  . Primaxin [Imipenem] Rash    Patient Measurements: Height: 5' 6.93" (170 cm) Weight: 301 lb (136.533 kg) IBW/kg (Calculated) : 61.44 Heparin Dosing Weight:    Vital Signs: Temp: 97.7 F (36.5 C) (10/09 0423) Temp Source: Oral (10/09 0423) BP: 104/64 mmHg (10/09 0423) Pulse Rate: 100 (10/09 0423)  Labs:  Recent Labs  10/06/14 0500 10/07/14 0615  HGB  --  8.8*  HCT  --  27.5*  PLT  --  300  LABPROT 36.9* 37.1*  INR 3.73* 3.75*  CREATININE 2.47* 2.47*    Estimated Creatinine Clearance: 32.3 ml/min (by C-G formula based on Cr of 2.47).  Assessment:  Anticoag: Coumadin PTA for hx AFib (5mg  TTSS and 2.5mg  on MWF PTA, INR 3.4 on admit then started Flagyl and INR peaked at 5), s/p Vit K 1mg  (9/13). S/p fall 9/15 but did not hit head. Amiodarone drug interaction possibly but she was on this PTA. INR 3.73>>3.75  ID: LLE cellulitis + new Strep agalactiae bactermia, Completed 14 days of tx. Do not think endocarditis as illness, "hyperacute" per Dr. Loletha Grayer. afb, WBC wnl. RN notes areas of excoriation. Unable to get OOB & doesn't tolerate q2h turning, Wound care on board.  Vanc  9/10 >> 9/14 LVQ 9/12 >> 9/13 Flagyl 9/10 >> 9/12 Cipro 9/11 >> 9/12 Ancef 9/14 >>9/23  9/16 BCx - neg 9/10 bcx - GBS In 1/2 (sens to PCN) 9/11 urine - 85K K pneumo (sens to cefazolin) 9/16: Blood NEG 9/21 Cdiff NEG  Cards: NICM (EF 35-40% on echo 9/13) / Afib / PPM / HLD / HF, Hx DCCV x 2 this yr, but doesn't hold NSR. VSS Meds: amio (on pta), torsemide + metolazone qod per renal for Anasarca; no ACEI d/t renal funct  Endo: on Synthroid (TSH nml in May). Gout -Uloric. DM2, CBGs 106-217, on SSI + linagliptin + Lantus 22/d   GI: Morbid obesity - LFTs ok 9/23, alb only 1.3, on MVI, Florastor  Neuro: fibromyalgia; sig wounds on legs and sacral area- Wound care seeing; c/o tremors->dc'd gaba->Seems to have resolved  Renal: solitary kidney / hyperparathyroidism. Stage IV CKD.Chronic massive bilat LE lymphadema. SCr BL 2.5-3.0, now trending down 2.47, K+ 3.7, on scheduled Kdur, Mag 2, phos 5, CoCa 9.2, Calcitriol BID, Oscal-D.  Pulm: OSA / PAH  Heme: s/p blood 10/8- Hgb 8.8 low stable, PLTC 300. Folate ok, B-12 up to 1180, Ferritin up to 632. On PO iron BID  PTA Med Issues: metop  Best Practices: warf  Goal of Therapy:  INR 2-3 Monitor platelets by anticoagulation protocol:  Yes   Plan:  - Hold coumadin today - f/u PT/INR, CBC in AM   Nicholle Falzon S. Alford Highland, PharmD, BCPS Clinical Staff Pharmacist Pager [phone]  Eilene Ghazi Stillinger 10/08/2014,11:35 AM

## 2014-10-08 NOTE — Progress Notes (Signed)
Attempted to see pt today and pt refused.  Talked at length to sister about the need to get OOB or at minimum to EOB and pt cont to state she will do it another day. Pt needs to get OOB for pressure relief.  Spoke to sister about doing exercises in bed over the weekend with her and trying to encourage her to get OOB come Monday. Jinger Neighbors, [place] [phone]

## 2014-10-08 NOTE — Progress Notes (Signed)
Physical Therapy Wound Treatment Patient Details  Name: [name] MRN: [id number] Date of Birth: 1950/08/15  Today's Date: 10/08/2014 Time: 5366-4403 Time Calculation (min): 78 min  Subjective  Subjective: I can not get up with the therapists later,  it will hurt too much on the edge of the bed. Patient and Family Stated Goals: decr pain in wounds and heal without surgery Date of Onset:  (multiple areas exact date unknown) Prior Treatments: mepitel foam, various types of tape  Pain Score: Pain Score: 9   Wound Assessment  Pressure Ulcer 09/10/14 Stage III -  Full thickness tissue loss. Subcutaneous fat may be visible but bone, tendon or muscle are NOT exposed. 3.5 X 2 (Active)  Dressing Type Gauze (Comment) 10/08/2014  3:30 AM  Dressing Changed 10/08/2014  3:30 AM  Dressing Change Frequency Every 5 days 09/26/2014 11:00 AM  State of Healing Early/partial granulation 09/26/2014 11:00 AM  Site / Wound Assessment Bleeding 09/29/2014  8:00 PM  % Wound base Red or Granulating 100% 09/29/2014  8:00 PM  % Wound base Yellow 75% 09/26/2014 11:00 AM  % Wound base Black 0% 09/26/2014 11:00 AM  % Wound base Other (Comment) 0% 09/26/2014 11:00 AM  Peri-wound Assessment Intact;Pink 09/26/2014 11:00 AM  Wound Length (cm) 3.5 cm 09/11/2014  8:00 AM  Wound Width (cm) 2 cm 09/11/2014  8:00 AM  Margins Unattached edges (unapproximated) 09/29/2014  8:00 PM  Drainage Amount Copious 10/07/2014  5:30 AM  Drainage Description Sanguineous 10/07/2014  5:30 AM  Treatment Cleansed;Other (Comment) 10/01/2014  9:30 AM     Pressure Ulcer 09/20/14 Deep Tissue Injury - Purple or maroon localized area of discolored intact skin or blood-filled blister due to damage of underlying soft tissue from pressure and/or shear. (Active)  Dressing Type Gauze (Comment) 10/08/2014  3:30 AM  Dressing Changed 10/08/2014  3:30 AM  Dressing Change Frequency Twice a day 10/01/2014  9:30 AM  State of Healing Non-healing 09/29/2014  8:04 PM  %  Wound base Red or Granulating 90% 09/29/2014  8:04 PM  % Wound base Yellow 10% 09/29/2014  8:04 PM  Margins Unattached edges (unapproximated) 09/29/2014  8:04 PM  Drainage Amount Scant 09/29/2014  8:04 PM  Drainage Description Serosanguineous 09/29/2014  8:04 PM  Treatment Cleansed;Other (Comment) 09/29/2014  8:04 PM     Pressure Ulcer 09/20/14 Deep Tissue Injury - Purple or maroon localized area of discolored intact skin or blood-filled blister due to damage of underlying soft tissue from pressure and/or shear. (Active)  Dressing Type Gauze (Comment);ABD;Moist to moist;Mesh briefs;Other (Comment) 10/08/2014 11:12 AM  Dressing Changed 10/08/2014 11:12 AM  Dressing Change Frequency Other (Comment) 10/08/2014 11:12 AM  State of Healing Eschar 10/08/2014 11:12 AM  Site / Wound Assessment Yellow;Granulation tissue;Painful 10/08/2014 11:12 AM  % Wound base Red or Granulating 5% 10/08/2014 11:12 AM  % Wound base Yellow 95% 10/08/2014 11:12 AM  % Wound base Black 0% 10/08/2014 11:12 AM  % Wound base Other (Comment) 0% 10/08/2014 11:12 AM  Peri-wound Assessment Pink;Intact 10/08/2014 11:12 AM  Wound Length (cm) 4.7 cm 10/07/2014 11:10 AM  Wound Width (cm) 6.5 cm 10/07/2014 11:10 AM  Wound Depth (cm) 0.4 cm 10/07/2014 11:10 AM  Margins Unattached edges (unapproximated) 10/08/2014 11:12 AM  Drainage Amount Moderate 10/08/2014 11:12 AM  Drainage Description Serous;Odor 10/08/2014 11:12 AM  Treatment Debridement (Selective);Hydrotherapy (Pulse lavage) 10/06/2014  9:57 AM     Wound 06/02/13 Abrasion(s) Leg Left (Active)  Dressing Type Gauze (Comment);Moist to moist;ABD;Mesh briefs 10/08/2014 11:12 AM  Dressing Changed Changed 10/08/2014  3:30 AM  Dressing Status Clean;Dry;Intact 10/08/2014 11:12 AM  Dressing Change Frequency Daily 10/08/2014 11:12 AM  Site / Wound Assessment Red 10/08/2014 11:12 AM  % Wound base Red or Granulating 100% 10/08/2014 11:12 AM  % Wound base Yellow 0% 10/08/2014 11:12 AM  % Wound base Black 0%  10/08/2014 11:12 AM  Peri-wound Assessment Intact;Edema;Erythema (blanchable) 10/08/2014 11:12 AM  Margins Unattached edges (unapproximated) 10/08/2014 11:12 AM  Closure None 10/08/2014 11:12 AM  Drainage Amount Moderate 10/08/2014 11:12 AM  Drainage Description Serous 10/08/2014 11:12 AM  Non-staged Wound Description Full thickness 10/08/2014 11:12 AM  Treatment Cleansed 10/06/2014  9:57 AM     Wound / Incision (Open or Dehisced) 07/24/14 Incision - Open Leg Left (Active)     Wound / Incision (Open or Dehisced) 07/24/14 Incision - Open Thigh Right scabbed over circular area (Active)     Wound / Incision (Open or Dehisced) 07/29/14 Other (Comment) Thigh Left;Medial HYDRO (Active)     Wound / Incision (Open or Dehisced) 08/02/14 Other (Comment) Thigh Right;Medial Hydro, R medial thigh wound (Active)     Wound / Incision (Open or Dehisced) 09/10/14 Other (Comment) Thigh Medial;Left;Posterior;Proximal (Active)  Dressing Type Gauze (Comment);ABD;Mesh briefs;Moist to moist;Other (Comment) 10/08/2014 11:12 AM  Dressing Changed Changed 10/08/2014  3:30 AM  Dressing Status Clean;Dry;Intact 10/08/2014 11:12 AM  Dressing Change Frequency Other (Comment) 10/08/2014 11:12 AM  Site / Wound Assessment Black;Yellow;Pink;Granulation tissue;Painful 10/08/2014 11:12 AM  % Wound base Red or Granulating 5% 10/08/2014 11:12 AM  % Wound base Yellow 90% 10/08/2014 11:12 AM  % Wound base Black 0% 10/08/2014 11:12 AM  Peri-wound Assessment Edema;Erythema (non-blanchable);Maceration;Pink 10/08/2014 11:12 AM  Wound Length (cm) 7.9 cm 10/07/2014 11:10 AM  Wound Width (cm) 9 cm 10/07/2014 11:10 AM  Wound Depth (cm) 0.2 cm 10/07/2014 11:10 AM  Margins Unattached edges (unapproximated) 10/08/2014 11:12 AM  Closure None 10/08/2014 11:12 AM  Drainage Amount Copious 10/08/2014 11:12 AM  Drainage Description Serous;Odor 10/08/2014 11:12 AM  Non-staged Wound Description Full thickness 10/08/2014 11:12 AM  Treatment Cleansed;Packing (Dry  gauze) 10/07/2014  5:30 AM     Wound / Incision (Open or Dehisced) 09/18/14 Other (Comment) Hip Right Black eschar tissue surrounded by pink tissue (Active)  Dressing Type Gauze (Comment);ABD;Moist to moist;Mesh briefs 10/08/2014 11:12 AM  Dressing Changed Changed 10/08/2014  3:30 AM  Dressing Status Clean;Dry;Intact 10/08/2014 11:12 AM  Dressing Change Frequency Other (Comment) 10/08/2014 11:12 AM  Site / Wound Assessment Granulation tissue;Yellow;Pink 10/08/2014 11:12 AM  % Wound base Red or Granulating 5% 10/08/2014 11:12 AM  % Wound base Yellow 60% 10/08/2014 11:12 AM  % Wound base Black 35% 10/08/2014 11:12 AM  % Wound base Other (Comment) 0% 10/08/2014 11:12 AM  Peri-wound Assessment Edema;Pink;Erythema (non-blanchable);Induration 10/08/2014 11:12 AM  Wound Length (cm) 8.4 cm 10/07/2014 11:10 AM  Wound Width (cm) 6.8 cm 10/07/2014 11:10 AM  Wound Depth (cm) 0.2 cm 10/07/2014 11:10 AM  Margins Unattached edges (unapproximated) 10/08/2014 11:12 AM  Closure None 10/08/2014 11:12 AM  Drainage Amount Moderate 10/08/2014 11:12 AM  Drainage Description Serous;Odor 10/08/2014 11:12 AM  Non-staged Wound Description Full thickness 10/08/2014 11:12 AM  Treatment Other (Comment) 10/07/2014  5:30 AM     Incision 05/05/12 Perineum Other (Comment) (Active)   Hydrotherapy Pulsed lavage therapy - wound location: Rt lateral hip, Lt lateral thigh, Lt proximal-posteriomedial thigh Pulsed Lavage with Suction (psi): 4 psi (to 8) Pulsed Lavage with Suction - Normal Saline Used: 2000 mL Pulsed Lavage Tip: Tip  with splash shield Selective Debridement Selective Debridement - Location: Rt lateral hip, Lt lateral thigh, Lt proximal-posteriomedial thigh Selective Debridement - Tools Used: Forceps;Scalpel;Scissors Selective Debridement - Tissue Removed: yellow and black/brown eschar; yellow slough   Wound Assessment and Plan  Wound Therapy - Assess/Plan/Recommendations Wound Therapy - Clinical Statement: The mesh briefs  seems to be working well.  Debridement progressing since  a change  from fentanyl to dilaudid, but dibridement still very slow Wound Therapy - Functional Problem List: painful wounds limiting her mobility tolerance Factors Delaying/Impairing Wound Healing: Diabetes Mellitus;Infection - systemic/local;Immobility;Multiple medical problems;Vascular compromise Hydrotherapy Plan: Debridement;Dressing change;Patient/family education;Pulsatile lavage with suction Wound Therapy - Frequency: 6X / week Wound Therapy - Current Recommendations: WOC nurse Wound Therapy - Follow Up Recommendations: Other (comment) (LTACH; sister working to change insurance to qualify) Wound Plan: see above  Wound Therapy Goals- Improve the function of patient's integumentary system by progressing the wound(s) through the phases of wound healing (inflammation - proliferation - remodeling) by: Decrease Necrotic Tissue to: <75% Decrease Necrotic Tissue - Progress: Progressing toward goal Increase Granulation Tissue to: >25% Increase Granulation Tissue - Progress: Progressing toward goal Improve Drainage Characteristics: Min Improve Drainage Characteristics - Progress: Not progressing Time For Goal Achievement: 7 days Wound Therapy - Potential for Goals: Good  Goals will be updated until maximal potential achieved or discharge criteria met.  Discharge criteria: when goals achieved, discharge from hospital, MD decision/surgical intervention, no progress towards goals, refusal/missing three consecutive treatments without notification or medical reason.  GP     Breigh Annett, Tessie Fass 10/08/2014, 11:23 AM 10/08/2014  Donnella Sham, PT [phone]  (pager)

## 2014-10-08 NOTE — Progress Notes (Signed)
Patient ID: [name], female   DOB: 1950/12/23, 64 y.o.   MRN: [id number]    Subjective: Pt c/o pain with dressing changes and hydrotherapy  Objective: Vital signs in last 24 hours: Temp:  [97.7 F (36.5 C)-98.5 F (36.9 C)] 97.7 F (36.5 C) (10/09 0423) Pulse Rate:  [95-100] 100 (10/09 0423) Resp:  [18] 18 (10/09 0423) BP: (101-108)/(49-64) 104/64 mmHg (10/09 0423) SpO2:  [92 %-95 %] 92 % (10/09 0423) Weight:  [301 lb (136.533 kg)] 301 lb (136.533 kg) (10/09 0423) Last BM Date: 10/06/14  Intake/Output from previous day: 10/08 0701 - 10/09 0700 In: 1200 [P.O.:1200] Out: 2951 [Urine:2950; Stool:1] Intake/Output this shift:    PE: Skin: multiple lower extremity wounds, all still mostly with necrotic tissue.  No evidence of acute infection or cellulitis  Lab Results:   Recent Labs  10/07/14 0615  WBC 8.1  HGB 8.8*  HCT 27.5*  PLT 300   BMET  Recent Labs  10/06/14 0500 10/07/14 0615  NA 133* 135*  K 3.5* 3.7  CL 92* 94*  CO2 30 28  GLUCOSE 170* 146*  BUN 99* 101*  CREATININE 2.47* 2.47*  CALCIUM 7.4* 7.4*   PT/INR  Recent Labs  10/06/14 0500 10/07/14 0615  LABPROT 36.9* 37.1*  INR 3.73* 3.75*   CMP     Component Value Date/Time   NA 135* 10/07/2014 0615   K 3.7 10/07/2014 0615   CL 94* 10/07/2014 0615   CO2 28 10/07/2014 0615   GLUCOSE 146* 10/07/2014 0615   BUN 101* 10/07/2014 0615   CREATININE 2.47* 10/07/2014 0615   CREATININE 2.10* 05/28/2014 0001   CREATININE 1.28* 02/12/2011 1000   CALCIUM 7.4* 10/07/2014 0615   CALCIUM 9.5 03/24/2012 1212   PROT 6.4 09/22/2014 0500   ALBUMIN 1.3* 10/07/2014 0615   AST 22 09/22/2014 0500   ALT <5 09/22/2014 0500   ALKPHOS 194* 09/22/2014 0500   BILITOT 0.7 09/22/2014 0500   GFRNONAA 19* 10/07/2014 0615   GFRAA 22* 10/07/2014 0615   Lipase  No results found for this basename: lipase       Studies/Results: No results found.  Anti-infectives: Anti-infectives   Start     Dose/Rate Route Frequency  Ordered Stop   09/19/14 2000  ceFAZolin (ANCEF) IVPB 2 g/50 mL premix     2 g 100 mL/hr over 30 Minutes Intravenous Every 8 hours 09/19/14 1307 09/22/14 2032   09/13/14 1200  ceFAZolin (ANCEF) IVPB 2 g/50 mL premix  Status:  Discontinued     2 g 100 mL/hr over 30 Minutes Intravenous Every 12 hours 09/13/14 1141 09/19/14 1307   09/13/14 1000  levofloxacin (LEVAQUIN) IVPB 500 mg  Status:  Discontinued     500 mg 100 mL/hr over 60 Minutes Intravenous Every 48 hours 09/11/14 0958 09/12/14 1640   09/11/14 2300  vancomycin (VANCOCIN) 1,750 mg in sodium chloride 0.9 % 500 mL IVPB  Status:  Discontinued     1,750 mg 250 mL/hr over 120 Minutes Intravenous Every 48 hours 09/09/14 2158 09/13/14 1139   09/11/14 1800  ciprofloxacin (CIPRO) IVPB 400 mg  Status:  Discontinued     400 mg 200 mL/hr over 60 Minutes Intravenous Every 24 hours 09/10/14 1723 09/11/14 0913   09/11/14 1030  levofloxacin (LEVAQUIN) IVPB 500 mg     500 mg 100 mL/hr over 60 Minutes Intravenous  Once 09/11/14 0958 09/11/14 1141   09/10/14 1645  ciprofloxacin (CIPRO) IVPB 400 mg     400 mg 200  mL/hr over 60 Minutes Intravenous STAT 09/10/14 1640 09/10/14 1844   09/09/14 2300  metroNIDAZOLE (FLAGYL) IVPB 500 mg  Status:  Discontinued     500 mg 100 mL/hr over 60 Minutes Intravenous Every 8 hours 09/09/14 2124 09/11/14 0915   09/09/14 2200  vancomycin (VANCOCIN) 1,500 mg in sodium chloride 0.9 % 500 mL IVPB     1,500 mg 250 mL/hr over 120 Minutes Intravenous  Once 09/09/14 2158 09/10/14 0207   09/09/14 1800  vancomycin (VANCOCIN) IVPB 1000 mg/200 mL premix     1,000 mg 200 mL/hr over 60 Minutes Intravenous  Once 09/09/14 1754 09/09/14 2042       Assessment/Plan  1. Multiple bilateral lower extremity wounds  Plan: 1. Cont current treatment with santyl and hydrotherapy. Unfortunately, due to the patient's body habitus and that she does not get up and mobilize much, she is at risk for delayed healing and further problems with  wounds. 2. She is stable from our standpoint for DC to [facility] when medicine is ok with her being discharged.  She should continue to get hydrotherapy done there and then she needs to follow up with the wound care clinic as an outpatient for further management.  She will be a prn for this weekend.  We will see her next week if she is still here.   LOS: 29 days    [name] E 10/08/2014, 1:13 PM Pager: [phone]

## 2014-10-08 NOTE — Progress Notes (Signed)
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 female with past medical history of NICM with ICD, chronic systolic CHF (EF 96-28% on 09/12/14), solitary right kidney with CKD Stage IV, hypothyroidism on replacement, pulmonary hypertension, PAF on coumadin, morbid obesity, fibromyalgia, insulin-dependent Type II DM, and OSA admitted and treated for septic shock due to left lower leg cellulitis.   SUBJECTIVE: No real complaints except for R leg soreness and redness.   OBJECTIVE Filed Vitals:   10/07/14 0538 10/07/14 1500 10/07/14 1928 10/08/14 0423  BP: 92/41 108/49 101/61 104/64  Pulse: 101 95 99 100  Temp: 97.9 F (36.6 C) 98 F (36.7 C) 98.5 F (36.9 C) 97.7 F (36.5 C)  TempSrc: Oral Oral Oral Oral  Resp: 18 18 18 18   Height:      Weight:    301 lb (136.533 kg)  SpO2: 94% 95% 92% 92%    Intake/Output Summary (Last 24 hours) at 10/08/14 0807 Last data filed at 10/08/14 0600  Gross per 24 hour  Intake    960 ml  Output   2951 ml  Net  -1991 ml   Filed Weights   10/06/14 2300 10/08/14 0423  Weight: 306 lb (138.801 kg) 301 lb (136.533 kg)    PHYSICAL EXAM General: Well developed, well nourished, female in no acute distress. Head:  Normocephalic, atraumatic.  Neck: Supple without bruits, JVD difficult to assess secondary to body habitus. Lungs:  Resp regular and unlabored, dense rales bases. Heart: Irreg R&R, S1, S2, no S3, S4, 2/6 murmur; no rub. Abdomen: Soft, non-tender, non-distended, BS + x 4.  Extremities: No clubbing, cyanosis, + edema. Erythema noted below the knees R>L, thigh wounds dressed.  Neuro: Alert and oriented X 3. Moves all extremities spontaneously. Psych: Upset due to pain with dressing changes  LABS: CBC:  Recent Labs  10/07/14 0615  WBC 8.1  HGB 8.8*  HCT 27.5*  MCV 98.2  PLT 300   INR:  Recent Labs  10/07/14 0615  INR 3.66*   Basic Metabolic Panel:  Recent Labs  10/06/14 0500 10/07/14 0615  NA 133* 135*  K 3.5* 3.7  CL 92* 94*  CO2 30 28  GLUCOSE 170* 146*  BUN 99* 101*  CREATININE 2.47* 2.47*  CALCIUM 7.4* 7.4*  MG 1.9  --   PHOS 4.6 4.6   Liver Function Tests:  Recent Labs  10/06/14 0500 10/07/14 0615  ALBUMIN 1.4* 1.3*   BNP: Pro B Natriuretic peptide (BNP)  Date/Time Value Ref Range Status  09/30/2014  5:20 AM 8120.0* 0 - 125 pg/mL Final  06/06/2013 12:35 PM 4452.0* 0 - 125 pg/mL Final    TELE:  Atrial fib, rate  generally well-controlled, rare paced beats, occasional fusion beats.   Current Medications:  . amiodarone  400 mg Oral Daily  . calcitRIOL  0.25 mcg Oral BID  . calcium-vitamin D  1 tablet Oral BID  . collagenase   Topical TID  . febuxostat  80 mg Oral Daily  . insulin aspart  0-15 Units Subcutaneous TID WC  . insulin aspart  0-5 Units Subcutaneous QHS  . insulin aspart  2 Units Subcutaneous TID WC  . insulin glargine  22 Units Subcutaneous QHS  . iron polysaccharides  150 mg Oral BID  . levothyroxine  125 mcg Oral QAC breakfast  . linagliptin  5 mg Oral Daily  . metolazone  2.5 mg Oral QODAY  . multivitamin with minerals  1 tablet Oral Daily  . potassium chloride  20 mEq Oral BID  . saccharomyces boulardii  250 mg Oral BID  . sodium  chloride  3 mL Intravenous Q12H  . torsemide  60 mg Oral BID  . 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 female with past medical history of NICM with ICD, chronic systolic CHF (EF 13-24% on 09/12/14), solitary right kidney with CKD Stage IV, hypothyroidism on replacement, pulmonary hypertension, PAF on coumadin, morbid obesity, fibromyalgia, insulin-dependent Type II DM, and OSA admitted and treated for septic shock due to left lower leg cellulitis.   Acute on chronic combined systolic and diastolic heart failure. Nearly impossible to assess her volume status, although she has had a large volume diuresis.  -- Weights as well as I/O have been inaccurate and not recorded consistently. Net neg 28L and neg 1.7 overnight -- 10/07, changed back to oral diuretics with a combination of torsemide BID and metolazone QOD. She has marked hypoalbuminemia with an albumin of 1.3 which is contributing to her anasarca.  -- No plans to further intensify diuresis as much of her chronic volume problem is related to obesity, hypoalbuminemia, etc.  Hypotension, improved. SBP high 80s-100s  -- Patient runs hypotensive  Atrial fibrillation. The patient is maintained on  warfarin per pharmacy. INR is 3.75 10/08.  Stage IV chronic kidney disease. No baseline creatinine probably in the low 2 range. Creatinine has plateaued in the 2.4-2.5 range which may be her new baseline. 2.47 yesterday  R Leg erythema- patient complaining of right leg soreness and redness concerning for cellulitis. No white count yesterday.  Per IM     Signed, Eileen Stanford , PA-C 8:07 AM 10/08/2014  Patient seen, examined. Available data reviewed. Agree with findings, assessment, and plan as outlined by Nell Range, PA-C. The patient was independently interviewed and examined. She is a pleasant, morbidly  obese woman in no distress. Lungs are clear. Heart is regular rate and rhythm with a grade 2/6 systolic murmur at the left sternal border extremities show diffuse edema.  Overall she continues to improve clinically. She is diuresing well on oral diuretics. Would continue the same. I would not recommend any changes in her cardiac regimen. She is maintained on warfarin for atrial fibrillation. Please call the [facility] office when she is ready for discharge and we will arrange a f/u appt with Dr Sallyanne Kuster or his PA/NP. Please call if cardiac issues arise. thx  Sherren Mocha, M.D. 10/08/2014 9:11 AM

## 2014-10-08 NOTE — Progress Notes (Signed)
PROGRESS NOTE    SENAYA DICENSO JME:[id number] DOB: Oct 14, 1950 DOA: 09/09/2014 PCP: Elby Showers, MD  HPI/Brief narrative 64 y.o. female with history of Morbid obesity, CKD 4, fibromyalgia, hypothyroidism, solitary kidney, hypothyroidism, diabetes, nonischemic cardiomyopathy with ejection fraction of 35%, atrial fibrillation who was admitted on 9/10 w/ recurrent Lower extremity cellulitis, immediately went into Septic shock- was in ICU on pressors until 9/14. Subsequently transferred to the Floor, Patient was on cefazolin from 9/14. Clinically volume overloaded with anasarca and bring diuresed per Cards.    Assessment/Plan:  1. Septic shock: Due to bilateral leg cellulitis. Required vasopressors in ICU. Resolved. 2. Multiple bilateral lower extremity wounds: Completed antibiotic course. Surgery & WOC followup appreciated. Continue hydrotherapy with PT. She will need OP follow up with plastic surgery. As per discussion with case management, if no SNF available to manage wound care, may be eligible for LTAC. Significant pain during wound care- not controlled with current meds> will change Fentanyl to Dilaudid. 3. Acute on chronic combined systolic and diastolic CHF/anasarca: Cardiology signed off. Difficult to assess volume status despite large volume diuresis. Weights have been inaccurate and not regularly recorded. Cardiology switched her to oral diuretics-torsemide and metolazone. Marked hypoalbuminemia also contributing to anasarca. Improving slowly 4. A. Fib/s/p PPM: Controlled ventricular rate. Coumadin per pharmacy. 5. Hypotension: better. 6. 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 Continue Lantus, NovoLog mealtime and SSI. 7. Hypothyroid: Continue levothyroxine. Recent TSH 2 on 05/29/1511 8. History of gout: Stable  9. History of OHS/morbid obesity: Nightly CPAP 10. Tremors: Gabapentin discontinued. Seems to have resolved.  11. Acute on stage IV  chronic kidney disease: No baseline creatinine probably in the low 2 range. Creatinine has plateaued in the 2.4-2.5 range which may be her new baseline. Periodically follow BMP.  12. RUE swelling: Dopplers negative for DVT. Likely from anasarca. 13. Anemia: Secondary to chronic kidney disease, acute illness and iron deficiency: Stable 14. Hypokalemia: Replaced   Code Status: Full Family Communication: None at bedside Disposition Plan: LTAC vs SNF when bed available-approaching medical stability for discharge. Discussed at length with CM at morning rounds. CM discussed with patient and sister and have agreed for Palliative Consult for [place].   Consultants:  General surgery   Cardiology  Critical care medicine-signed off  Nephrology-seem to have signed off  [place] - pending  Procedures:  Central venous catheter insertion procedure on 09/10/2014 >Will attempt to try for a peripheral IV line, then DC central line.  Hydrotherapy   Antibiotics:  Completed all antibiotics 9/23   Subjective: Pain this morning while she was undergoing hydrotherapy.  Objective: Filed Vitals:   10/07/14 1500 10/07/14 1928 10/08/14 0423 10/08/14 1300  BP: 108/49 101/61 104/64   Pulse: 95 99 100 103  Temp: 98 F (36.7 C) 98.5 F (36.9 C) 97.7 F (36.5 C) 98 F (36.7 C)  TempSrc: Oral Oral Oral Oral  Resp: 18 18 18 18   Height:      Weight:   136.533 kg (301 lb)   SpO2: 95% 92% 92% 95%    Intake/Output Summary (Last 24 hours) at 10/08/14 1633 Last data filed at 10/08/14 0600  Gross per 24 hour  Intake    720 ml  Output   1551 ml  Net   -831 ml   Filed Weights   10/06/14 2300 10/08/14 0423  Weight: 138.801 kg (306 lb) 136.533 kg (301 lb)     Exam:  General exam: Moderately built and morbidly  obese female lying comfortably supine in bed.  Respiratory system: Clear. No increased work of breathing. Cardiovascular system: S1 & S2 heard, RRR. No JVD, murmurs, gallops,  clicks. 2+ pitting bilateral leg edema/anasarca.telemetry: A. fib with controlled ventricular rate. Gastrointestinal system: Abdomen is nondistended, soft and nontender. Normal bowel sounds heard. Central nervous system: Alert and oriented. No focal neurological deficits. Extremities: Symmetric 5 x 5 power.Multiple bilateral lower extremity wounds-details as per PT note 10/7. Patient does have an area of mild erythema and increased warmth of right lower leg- improving.   Data Reviewed: Basic Metabolic Panel:  Recent Labs Lab 10/02/14 0445 10/03/14 0530 10/04/14 0400 10/05/14 0440 10/06/14 0500 10/07/14 0615  NA 131* 135* 132* 135* 133* 135*  K 3.6* 3.7 3.3* 3.5* 3.5* 3.7  CL 93* 96 93* 95* 92* 94*  CO2 27 28 28 30 30 28   GLUCOSE 169* 162* 180* 125* 170* 146*  BUN 105* 104* 100* 98* 99* 101*  CREATININE 2.75* 2.70* 2.55* 2.52* 2.47* 2.47*  CALCIUM 7.0* 7.1* 7.2* 7.3* 7.4* 7.4*  MG 2.0  --  2.0 2.0 1.9  --   PHOS 5.0* 5.0* 4.8* 4.6 4.6 4.6   Liver Function Tests:  Recent Labs Lab 10/03/14 0530 10/04/14 0400 10/05/14 0440 10/06/14 0500 10/07/14 0615  ALBUMIN 1.3* 1.3* 1.4* 1.4* 1.3*   No results found for this basename: LIPASE, AMYLASE,  in the last 168 hours No results found for this basename: AMMONIA,  in the last 168 hours CBC:  Recent Labs Lab 10/07/14 0615  WBC 8.1  HGB 8.8*  HCT 27.5*  MCV 98.2  PLT 300   Cardiac Enzymes: No results found for this basename: CKTOTAL, CKMB, CKMBINDEX, TROPONINI,  in the last 168 hours BNP (last 3 results)  Recent Labs  09/30/14 0520  PROBNP 8120.0*   CBG:  Recent Labs Lab 10/07/14 1115 10/07/14 1619 10/07/14 2212 10/08/14 0620 10/08/14 1120  GLUCAP 162* 151* 154* 106* 153*    No results found for this or any previous visit (from the past 240 hour(s)).      Studies: No results found.      Scheduled Meds: . amiodarone  400 mg Oral Daily  . calcitRIOL  0.25 mcg Oral BID  . calcium-vitamin D  1  tablet Oral BID  . collagenase   Topical TID  . febuxostat  80 mg Oral Daily  .  HYDROmorphone (DILAUDID) injection  2 mg Intravenous Daily  . insulin aspart  0-15 Units Subcutaneous TID WC  . insulin aspart  0-5 Units Subcutaneous QHS  . insulin aspart  2 Units Subcutaneous TID WC  . insulin glargine  22 Units Subcutaneous QHS  . iron polysaccharides  150 mg Oral BID  . levothyroxine  125 mcg Oral QAC breakfast  . linagliptin  5 mg Oral Daily  . metolazone  2.5 mg Oral QODAY  . multivitamin with minerals  1 tablet Oral Daily  . potassium chloride  20 mEq Oral BID  . saccharomyces boulardii  250 mg Oral BID  . sodium chloride  3 mL Intravenous Q12H  . torsemide  60 mg Oral BID  . 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    Vernell Leep, MD, FACP, FHM. [facility] Pager [phone]  If 7PM-7AM, please contact night-coverage [email or web address] Password [email or web address] 10/08/2014, 4:33 PM    LOS: 29 days

## 2014-10-08 NOTE — Clinical Social Work Note (Signed)
Clinical Education officer, museum met with CM, patient and patient family at bedside to offer continued support and discuss further needs at discharge.  Patient and patient family agreeable with Palliative Care Consult to outline patient goals and disposition planning needs.  MD has placed order.  Patient sister mentioned potential litigation regarding patient fall while on the unit, CSW provided suggestion to contact Office of Patient Experience to first express concerns.  Patient sister appreciative of information and plans to further pursue.   Clinical Social Worker offered patient and patient family bed offer from [facility] with the ability to complete hydrotherapy - patient is not yet medically stable due to breakthrough IV medications.  Patient to outline goals and disposition needs with Palliative Care.  Patient family to further explore the possibility of patient return home with private duty assistance and/or Hospice care.  CSW remains available for support and to assist with patient discharge planning needs.  Barbette Or, [place]

## 2014-10-09 LAB — GLUCOSE, CAPILLARY
GLUCOSE-CAPILLARY: 137 mg/dL — AB (ref 70–99)
GLUCOSE-CAPILLARY: 174 mg/dL — AB (ref 70–99)
Glucose-Capillary: 137 mg/dL — ABNORMAL HIGH (ref 70–99)
Glucose-Capillary: 190 mg/dL — ABNORMAL HIGH (ref 70–99)

## 2014-10-09 LAB — PROTIME-INR
INR: 4.4 — AB (ref 0.00–1.49)
Prothrombin Time: 42 seconds — ABNORMAL HIGH (ref 11.6–15.2)

## 2014-10-09 LAB — CBC
HEMATOCRIT: 27.4 % — AB (ref 36.0–46.0)
Hemoglobin: 8.7 g/dL — ABNORMAL LOW (ref 12.0–15.0)
MCH: 31.4 pg (ref 26.0–34.0)
MCHC: 31.8 g/dL (ref 30.0–36.0)
MCV: 98.9 fL (ref 78.0–100.0)
PLATELETS: 316 10*3/uL (ref 150–400)
RBC: 2.77 MIL/uL — ABNORMAL LOW (ref 3.87–5.11)
RDW: 19.7 % — AB (ref 11.5–15.5)
WBC: 8.2 10*3/uL (ref 4.0–10.5)

## 2014-10-09 LAB — BASIC METABOLIC PANEL
Anion gap: 12 (ref 5–15)
BUN: 97 mg/dL — ABNORMAL HIGH (ref 6–23)
CHLORIDE: 93 meq/L — AB (ref 96–112)
CO2: 30 mEq/L (ref 19–32)
Calcium: 7.4 mg/dL — ABNORMAL LOW (ref 8.4–10.5)
Creatinine, Ser: 2.26 mg/dL — ABNORMAL HIGH (ref 0.50–1.10)
GFR, EST AFRICAN AMERICAN: 25 mL/min — AB (ref >90)
GFR, EST NON AFRICAN AMERICAN: 21 mL/min — AB (ref >90)
Glucose, Bld: 136 mg/dL — ABNORMAL HIGH (ref 70–99)
POTASSIUM: 3.8 meq/L (ref 3.7–5.3)
SODIUM: 135 meq/L — AB (ref 137–147)

## 2014-10-09 NOTE — Progress Notes (Signed)
Physical Therapy Wound Treatment Patient Details  Name: [name] MRN: [id number] Date of Birth: 10/03/1950  Today's Date: 10/09/2014 Time: 0900-1008 Time Calculation (min): 68 min  Subjective  Subjective: Patient reports she would like to try PT first, then hydro.  She feels hydro hurts her so much, that might be better.  Pain Score: Pain Score: 5   Wound Assessment     Pressure Ulcer 09/20/14 Deep Tissue Injury - Purple or maroon localized area of discolored intact skin or blood-filled blister due to damage of underlying soft tissue from pressure and/or shear. (Active)  Dressing Type Gauze (Comment);ABD;Mesh briefs;Other (Comment) 10/09/2014 10:08 AM  Dressing Changed 10/09/2014 10:08 AM  Dressing Change Frequency Other (Comment) 10/09/2014 10:08 AM  State of Healing Eschar 10/09/2014 10:08 AM  Site / Wound Assessment Yellow;Painful;Granulation tissue 10/09/2014 10:08 AM  % Wound base Red or Granulating 5% 10/09/2014 10:08 AM  % Wound base Yellow 95% 10/09/2014 10:08 AM  % Wound base Black 0% 10/09/2014 10:08 AM  % Wound base Other (Comment) 0% 10/09/2014 10:08 AM  Peri-wound Assessment Pink;Intact 10/09/2014 10:08 AM  Wound Length (cm) 4.7 cm 10/07/2014 11:10 AM  Wound Width (cm) 6.5 cm 10/07/2014 11:10 AM  Wound Depth (cm) 0.4 cm 10/07/2014 11:10 AM  Margins Unattached edges (unapproximated) 10/09/2014 10:08 AM  Drainage Amount Moderate 10/09/2014 10:08 AM  Drainage Description Serous;Odor 10/09/2014 10:08 AM  Treatment Hydrotherapy (Pulse lavage) 10/09/2014 10:08 AM     Wound 06/02/13 Abrasion(s) Leg Left (Active)  Dressing Type Gauze (Comment);ABD;Mesh briefs 10/09/2014 10:08 AM  Dressing Changed Changed 10/09/2014 10:08 AM  Dressing Status Clean;Dry;Intact 10/09/2014 10:08 AM  Dressing Change Frequency Daily 10/09/2014 10:08 AM  Site / Wound Assessment Red 10/09/2014 10:08 AM  % Wound base Red or Granulating 100% 10/09/2014 10:08 AM  % Wound base Yellow 0%  10/09/2014 10:08 AM  % Wound base Black 0% 10/09/2014 10:08 AM  % Wound base Other (Comment) 0% 10/09/2014 10:08 AM  Peri-wound Assessment Intact;Edema;Erythema (blanchable) 10/09/2014 10:08 AM  Margins Unattached edges (unapproximated) 10/09/2014 10:08 AM  Closure None 10/09/2014 10:08 AM  Drainage Amount Moderate 10/09/2014 10:08 AM  Drainage Description Serous 10/09/2014 10:08 AM  Non-staged Wound Description Full thickness 10/08/2014 11:12 AM  Treatment Hydrotherapy (Pulse lavage) 10/09/2014 10:08 AM        Wound / Incision (Open or Dehisced) 09/10/14 Other (Comment) Thigh Medial;Left;Posterior;Proximal (Active)  Dressing Type Gauze (Comment);ABD;Mesh briefs;Other (Comment) 10/09/2014 10:08 AM  Dressing Changed Changed 10/09/2014 10:08 AM  Dressing Status Clean;Dry;Intact 10/09/2014 10:08 AM  Dressing Change Frequency Other (Comment) 10/09/2014 10:08 AM  Site / Wound Assessment Yellow;Granulation tissue;Painful;Pink 10/09/2014 10:08 AM  % Wound base Red or Granulating 10% 10/09/2014 10:08 AM  % Wound base Yellow 90% 10/09/2014 10:08 AM  % Wound base Black 0% 10/09/2014 10:08 AM  % Wound base Other (Comment) 0% 10/09/2014 10:08 AM  Peri-wound Assessment Edema;Erythema (non-blanchable);Maceration;Pink 10/09/2014 10:08 AM  Wound Length (cm) 7.9 cm 10/07/2014 11:10 AM  Wound Width (cm) 9 cm 10/07/2014 11:10 AM  Wound Depth (cm) 0.2 cm 10/07/2014 11:10 AM  Margins Unattached edges (unapproximated) 10/09/2014 10:08 AM  Closure None 10/09/2014 10:08 AM  Drainage Amount Moderate 10/09/2014 10:08 AM  Drainage Description Serous;Odor 10/09/2014 10:08 AM  Non-staged Wound Description Full thickness 10/09/2014 10:08 AM  Treatment Hydrotherapy (Pulse lavage) 10/09/2014 10:08 AM     Wound / Incision (Open or Dehisced) 09/18/14 Other (Comment) Hip Right Black eschar tissue surrounded by pink tissue (Active)  Dressing Type Gauze (Comment);ABD;Mesh briefs 10/09/2014  10:08 AM  Dressing Changed  Changed 10/09/2014 10:08 AM  Dressing Status Clean;Dry;Intact 10/09/2014 10:08 AM  Dressing Change Frequency Other (Comment) 10/09/2014 10:08 AM  Site / Wound Assessment Yellow;Black;Pink 10/09/2014 10:08 AM  % Wound base Red or Granulating 5% 10/09/2014 10:08 AM  % Wound base Yellow 60% 10/09/2014 10:08 AM  % Wound base Black 35% 10/09/2014 10:08 AM  % Wound base Other (Comment) 0% 10/09/2014 10:08 AM  Peri-wound Assessment Edema;Pink;Erythema (non-blanchable);Induration 10/09/2014 10:08 AM  Wound Length (cm) 8.4 cm 10/07/2014 11:10 AM  Wound Width (cm) 6.8 cm 10/07/2014 11:10 AM  Wound Depth (cm) 0.2 cm 10/07/2014 11:10 AM  Margins Unattached edges (unapproximated) 10/09/2014 10:08 AM  Closure None 10/09/2014 10:08 AM  Drainage Amount Moderate 10/09/2014 10:08 AM  Drainage Description Serous;Odor 10/09/2014 10:08 AM  Non-staged Wound Description Full thickness 10/09/2014 10:08 AM  Treatment Hydrotherapy (Pulse lavage) 10/09/2014 10:08 AM     Incision 05/05/12 Perineum Other (Comment) (Active)   Hydrotherapy Pulsed lavage therapy - wound location: Rt lateral hip, Lt lateral thigh, Lt proximal-posteriomedial thigh Pulsed Lavage with Suction (psi): 8 psi (4 on cleaner wounds) Pulsed Lavage with Suction - Normal Saline Used: 2000 mL Pulsed Lavage Tip: Tip with splash shield Selective Debridement Selective Debridement - Location: Rt lateral hip, Lt lateral thigh, Lt proximal-posteriomedial thigh Selective Debridement - Tools Used: Scissors;Forceps Selective Debridement - Tissue Removed: yellow/brown eschar, yellow slough, fatty tissue   Wound Assessment and Plan  Wound Therapy - Assess/Plan/Recommendations Wound Therapy - Clinical Statement: Patient's wounds still mostly eschar, debridement is slow secondary to involvement and pain.  Patient in good spirits today.  Patient will continue to benefit from hydrotherapy to cleanse wounds and promote healing and this will be a long, steady  process.   Wound Therapy - Functional Problem List: painful wounds limiting her mobility tolerance Factors Delaying/Impairing Wound Healing: Diabetes Mellitus;Infection - systemic/local;Immobility;Multiple medical problems;Vascular compromise Hydrotherapy Plan: Debridement;Dressing change;Patient/family education;Pulsatile lavage with suction Wound Therapy - Frequency: 6X / week Wound Therapy - Follow Up Recommendations: Other (comment) (LTACH) Wound Plan: see above  Wound Therapy Goals- Improve the function of patient's integumentary system by progressing the wound(s) through the phases of wound healing (inflammation - proliferation - remodeling) by: Decrease Necrotic Tissue to: <75% Decrease Necrotic Tissue - Progress: Progressing toward goal Increase Granulation Tissue to: >25% Increase Granulation Tissue - Progress: Progressing toward goal Improve Drainage Characteristics: Min Improve Drainage Characteristics - Progress: Progressing toward goal  Goals will be updated until maximal potential achieved or discharge criteria met.  Discharge criteria: when goals achieved, discharge from hospital, MD decision/surgical intervention, no progress towards goals, refusal/missing three consecutive treatments without notification or medical reason.  GP     Shanna Cisco, [place] 10/09/2014, 10:29 AM

## 2014-10-09 NOTE — Progress Notes (Addendum)
PROGRESS NOTE    [name] HEN:[id number] DOB: September 01, 1950 DOA: 09/09/2014 PCP: Elby Showers, MD  HPI/Brief narrative 64 y.o. female with history of Morbid obesity, CKD 4, fibromyalgia, hypothyroidism, solitary kidney, hypothyroidism, diabetes, nonischemic cardiomyopathy with ejection fraction of 35%, atrial fibrillation who was admitted on 9/10 w/ recurrent Lower extremity cellulitis, immediately went into Septic shock- was in ICU on pressors until 9/14. Subsequently transferred to the Floor, Patient was on cefazolin from 9/14. Clinically volume overloaded with anasarca and bring diuresed per Cards.    Assessment/Plan:  1. Septic shock: Due to bilateral leg cellulitis. Required vasopressors in ICU. Resolved. 2. Multiple bilateral lower extremity wounds: Completed antibiotic course. Surgery & WOC followup appreciated. Continue hydrotherapy with PT. She will need OP follow up with plastic surgery. As per discussion with case management, if no SNF available to manage wound care, may be eligible for LTAC. Significant pain during wound care- much better controlled with Dilaudid 2 mg IV x1 prior to dressing changes. 3. Acute on chronic combined systolic and diastolic CHF/anasarca: Cardiology signed off. Difficult to assess volume status despite large volume diuresis. Weights have been inaccurate and not regularly recorded. Cardiology switched her to oral diuretics-torsemide and metolazone. Marked hypoalbuminemia also contributing to anasarca. Improving slowly. Swelling of bilateral legs probably from chronic venous stasis. Cardiology signed off. Please call the [facility] office when she is ready for discharge and cardiology will arrange a f/u appt with Dr Sallyanne Kuster or his PA/NP. Please call if cardiac issues arise.  4. A. Fib/s/p PPM: Controlled ventricular rate. Coumadin per pharmacy. INR 4.4. 5. Hypotension: better. 6. 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 Continue Lantus, NovoLog mealtime and SSI. 7. Hypothyroid: Continue levothyroxine. Recent TSH 2 on 05/29/1511 8. History of gout: Stable  9. History of OHS/morbid obesity: Nightly CPAP 10. Tremors: Gabapentin discontinued. Seems to have resolved.  11. Acute on stage IV chronic kidney disease: No baseline creatinine probably in the low 2 range. Creatinine has plateaued in the 2.4-2.5 range which may be her new baseline. Periodically follow BMP-creatinine were 2.26..  12. RUE swelling: Dopplers negative for DVT. Likely from anasarca. 13. Anemia: Secondary to chronic kidney disease, acute illness and iron deficiency: Stable 14. Hypokalemia: Replaced   Code Status: Full Family Communication: None at bedside Disposition Plan: LTAC vs SNF when bed available-approaching medical stability for discharge. Discussed at length with patient's sister [name] at bedside.  Consultants:  General surgery   Cardiology  Critical care medicine-signed off  Nephrology-seem to have signed off  [place] - pending  Procedures:  Central venous catheter insertion procedure on 09/10/2014 >Will attempt to try for a peripheral IV line, then DC central line.  Hydrotherapy   Antibiotics:  Completed all antibiotics 9/23   Subjective: Continues to complain of redness now in both legs -according to patient's sister Kieth Brightly, she has chronic and recurrent issue with this when it resolves for a few days then reappears as multiple small swellings and redness.  Objective: Filed Vitals:   10/08/14 0423 10/08/14 1300 10/08/14 2212 10/09/14 0359  BP: 104/64  109/64 114/52  Pulse: 100 103 101 94  Temp: 97.7 F (36.5 C) 98 F (36.7 C) 98.1 F (36.7 C) 97.8 F (36.6 C)  TempSrc: Oral Oral Oral Oral  Resp: 18 18 18 18   Height:      Weight: 136.533 kg (301 lb)     SpO2: 92% 95% 90% 94%    Intake/Output Summary (Last 24  hours) at 10/09/14 1451 Last data filed at 10/09/14 1013  Gross  per 24 hour  Intake    363 ml  Output   1450 ml  Net  -1087 ml   Filed Weights   10/06/14 2300 10/08/14 0423  Weight: 138.801 kg (306 lb) 136.533 kg (301 lb)     Exam:  General exam: Moderately built and morbidly obese female lying comfortably supine in bed. Undergoing hydrotherapy. Respiratory system: Clear. No increased work of breathing. Cardiovascular system: S1 & S2 heard, RRR. No JVD, murmurs, gallops, clicks. 2+ pitting bilateral leg edema/anasarca.telemetry: A. fib with controlled ventricular rate-DC 10/10. Gastrointestinal system: Abdomen is nondistended, soft and nontender. Normal bowel sounds heard. Central nervous system: Alert and oriented. No focal neurological deficits. Extremities: Symmetric 5 x 5 power.Multiple bilateral lower extremity wounds-details as per PT note 10/7. Patient does have an area of mild erythema and increased warmth of right lower leg- improving.   Data Reviewed: Basic Metabolic Panel:  Recent Labs Lab 10/03/14 0530 10/04/14 0400 10/05/14 0440 10/06/14 0500 10/07/14 0615 10/09/14 0605  NA 135* 132* 135* 133* 135* 135*  K 3.7 3.3* 3.5* 3.5* 3.7 3.8  CL 96 93* 95* 92* 94* 93*  CO2 28 28 30 30 28 30   GLUCOSE 162* 180* 125* 170* 146* 136*  BUN 104* 100* 98* 99* 101* 97*  CREATININE 2.70* 2.55* 2.52* 2.47* 2.47* 2.26*  CALCIUM 7.1* 7.2* 7.3* 7.4* 7.4* 7.4*  MG  --  2.0 2.0 1.9  --   --   PHOS 5.0* 4.8* 4.6 4.6 4.6  --    Liver Function Tests:  Recent Labs Lab 10/03/14 0530 10/04/14 0400 10/05/14 0440 10/06/14 0500 10/07/14 0615  ALBUMIN 1.3* 1.3* 1.4* 1.4* 1.3*   No results found for this basename: LIPASE, AMYLASE,  in the last 168 hours No results found for this basename: AMMONIA,  in the last 168 hours CBC:  Recent Labs Lab 10/07/14 0615 10/09/14 0605  WBC 8.1 8.2  HGB 8.8* 8.7*  HCT 27.5* 27.4*  MCV 98.2 98.9  PLT 300 316   Cardiac Enzymes: No results found for this basename: CKTOTAL, CKMB, CKMBINDEX, TROPONINI,   in the last 168 hours BNP (last 3 results)  Recent Labs  09/30/14 0520  PROBNP 8120.0*   CBG:  Recent Labs Lab 10/08/14 1120 10/08/14 1618 10/08/14 2215 10/09/14 0603 10/09/14 1149  GLUCAP 153* 190* 186* 137* 137*    No results found for this or any previous visit (from the past 240 hour(s)).      Studies: No results found.      Scheduled Meds: . amiodarone  400 mg Oral Daily  . calcitRIOL  0.25 mcg Oral BID  . calcium-vitamin D  1 tablet Oral BID  . collagenase   Topical TID  . febuxostat  80 mg Oral Daily  .  HYDROmorphone (DILAUDID) injection  2 mg Intravenous Daily  . insulin aspart  0-15 Units Subcutaneous TID WC  . insulin aspart  0-5 Units Subcutaneous QHS  . insulin aspart  2 Units Subcutaneous TID WC  . insulin glargine  22 Units Subcutaneous QHS  . iron polysaccharides  150 mg Oral BID  . levothyroxine  125 mcg Oral QAC breakfast  . linagliptin  5 mg Oral Daily  . metolazone  2.5 mg Oral QODAY  . multivitamin with minerals  1 tablet Oral Daily  . potassium chloride  20 mEq Oral BID  . saccharomyces boulardii  250 mg Oral BID  . sodium chloride  3 mL Intravenous Q12H  . torsemide  60 mg Oral BID  . 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    Vernell Leep, MD, FACP, FHM. [facility] Pager [phone]  If 7PM-7AM, please contact night-coverage [email or web address] Password [email or web address] 10/09/2014, 2:51 PM    LOS: 30 days

## 2014-10-09 NOTE — Progress Notes (Signed)
Pt. refusing Cpap

## 2014-10-09 NOTE — Progress Notes (Signed)
ANTICOAGULATION CONSULT NOTE - Follow Up Consult  Pharmacy Consult for Coumadin Indication: atrial fibrillation  Allergies  Allergen Reactions  . Ivp Dye [Iodinated Diagnostic Agents] Shortness Of Breath    Turn red, can't breathe  . Betadine [Povidone Iodine]   . Diphenhydramine Hcl Swelling    In hands and eyes  . Fish Allergy Nausea And Vomiting  . Iohexol      Desc: PT TURNS RED AND WHEEZING   . Penicillins Other (See Comments)    Resp arrest as child. Tolerates cephalosporins.  . Povidone-Iodine Other (See Comments)    Wheezing, turn red, can't breath, and blisters  . Promethazine Hcl Other (See Comments)    Low Blood Pressure  . Tape     Blisters, can use paper tape for short periods  . Clindamycin Hives and Rash    wheezing  . Iodine Rash  . Morphine Sulfate Nausea And Vomiting and Rash  . Primaxin [Imipenem] Rash    Patient Measurements: Height: 5' 6.93" (170 cm) Weight: 301 lb (136.533 kg) IBW/kg (Calculated) : 61.44 Heparin Dosing Weight:    Vital Signs: Temp: 97.8 F (36.6 C) (10/10 0359) Temp Source: Oral (10/10 0359) BP: 114/52 mmHg (10/10 0359) Pulse Rate: 94 (10/10 0359)  Labs:  Recent Labs  10/07/14 0615 10/09/14 0605  HGB 8.8* 8.7*  HCT 27.5* 27.4*  PLT 300 316  LABPROT 37.1* 42.0*  INR 3.75* 4.40*  CREATININE 2.47* 2.26*    Estimated Creatinine Clearance: 35.3 ml/min (by C-G formula based on Cr of 2.26).  Assessment:  Anticoag: Coumadin PTA for hx AFib (5mg  TTSS and 2.5mg  on MWF PTA, INR 3.4 on admit then started Flagyl and INR peaked at 5), s/p Vit K 1mg  (9/13). S/p fall 9/15 but did not hit head. Amiodarone drug interaction possibe, but she was on this PTA. INR now up to 4.4?  Goal of Therapy:  INR 2-3 Monitor platelets by anticoagulation protocol: Yes   Plan:  - Continue to hold Coumadin - f/u PT/INR, CBC in AM   Whyatt Klinger S. Alford Highland, PharmD, [place] Clinical Staff Pharmacist Pager [phone]  [place], [place] 10/09/2014,12:42 PM

## 2014-10-09 NOTE — Care Management Note (Signed)
CSW and this Case Manager met with pt, sister, pt's husband, and pt's cousin to discuss dc planning.  Pt made aware that she does not meet eligibility for LTAC, and currently insurance will not cover.  Pt with continued reluctance to consider or discuss discharge to skilled nursing facility.  Patient and family agreeable to palliative care consult to help establish goals of care and assist with discharge planning.  Sister stated she may consider hiring private duty nurse to go home with patient, if SNF the only option.     Sister proceeded to mention possible litigation regarding fall on this unit.  Encouraged sister to discuss issue with Office of Patient Experience prior to proceeding.  Contact information given.    Will continue to follow this difficult case.  Lionel December, RN, BSN Phone [phone]

## 2014-10-10 DIAGNOSIS — K5900 Constipation, unspecified: Secondary | ICD-10-CM

## 2014-10-10 LAB — GLUCOSE, CAPILLARY
GLUCOSE-CAPILLARY: 174 mg/dL — AB (ref 70–99)
Glucose-Capillary: 182 mg/dL — ABNORMAL HIGH (ref 70–99)
Glucose-Capillary: 169 mg/dL — ABNORMAL HIGH (ref 70–99)
Glucose-Capillary: 180 mg/dL — ABNORMAL HIGH (ref 70–99)

## 2014-10-10 LAB — PROTIME-INR
INR: 4.07 — ABNORMAL HIGH (ref 0.00–1.49)
Prothrombin Time: 39.5 seconds — ABNORMAL HIGH (ref 11.6–15.2)

## 2014-10-10 MED ORDER — SENNA 8.6 MG PO TABS
2.0000 | ORAL_TABLET | Freq: Two times a day (BID) | ORAL | Status: DC
  Administered 2014-10-10 – 2014-10-13 (×6): 17.2 mg via ORAL
  Administered 2014-10-18: 8.6 mg via ORAL
  Administered 2014-10-19 – 2014-10-20 (×3): 17.2 mg via ORAL
  Administered 2014-10-20: 8.6 mg via ORAL
  Filled 2014-10-10 (×25): qty 2

## 2014-10-10 MED ORDER — BISACODYL 10 MG RE SUPP: 10.0000 mg | Freq: Every day | RECTAL | Status: DC | PRN

## 2014-10-10 NOTE — Progress Notes (Addendum)
PROGRESS NOTE    [name] XVQ:[id number] DOB: Mar 04, 1950 DOA: 09/09/2014 PCP: Elby Showers, MD  HPI/Brief narrative 64 y.o. female with history of Morbid obesity, CKD 4, fibromyalgia, hypothyroidism, solitary kidney, hypothyroidism, diabetes, nonischemic cardiomyopathy with ejection fraction of 35%, atrial fibrillation who was admitted on 9/10 w/ recurrent Lower extremity cellulitis, immediately went into Septic shock- was in ICU on pressors until 9/14. Subsequently transferred to the Floor, Patient was on cefazolin from 9/14. Clinically volume overloaded with anasarca and bring diuresed per Cards.    Assessment/Plan:  1. Septic shock: Due to bilateral leg cellulitis. Required vasopressors in ICU. Resolved. 2. Multiple bilateral lower extremity wounds: Completed antibiotic course. Surgery & WOC followup appreciated. Continue hydrotherapy with PT. She will need OP follow up with plastic surgery. As per discussion with case management, if no SNF available to manage wound care, may be eligible for LTAC. Significant pain during wound care- much better controlled with Dilaudid 2 mg IV x1 prior to dressing changes. 3. Acute on chronic combined systolic and diastolic CHF/anasarca: Cardiology signed off. Difficult to assess volume status despite large volume diuresis. Weights have been inaccurate and not regularly recorded. Cardiology switched her to oral diuretics-torsemide and metolazone. Marked hypoalbuminemia also contributing to anasarca. Improving slowly. Swelling of bilateral legs probably from chronic venous stasis. Cardiology signed off. Please call the [facility] office when she is ready for discharge and cardiology will arrange a f/u appt with Dr Sallyanne Kuster or his PA/NP. Please call if cardiac issues arise.  4. A. Fib/s/p PPM: Controlled ventricular rate. Coumadin per pharmacy. INR 4.07. 5. Hypotension: resolved 6. 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 Continue Lantus, NovoLog mealtime and SSI. 7. Hypothyroid: Continue levothyroxine. Recent TSH 2 on 05/29/1511 8. History of gout: Stable  9. History of OHS/morbid obesity: Nightly CPAP 10. Tremors: Gabapentin discontinued. Seems to have resolved.  11. Acute on stage IV chronic kidney disease: No baseline creatinine probably in the low 2 range. Creatinine has plateaued in the 2.4-2.5 range which may be her new baseline. Periodically follow BMP-creatinine 2.26 on 10/10 12. RUE swelling: Dopplers negative for DVT. Likely from anasarca. 13. Anemia: Secondary to chronic kidney disease, acute illness and iron deficiency: Stable 14. Hypokalemia: Replaced 15. Constipation: Laxatives   Code Status: Full Family Communication: None at bedside Disposition Plan: LTAC vs SNF when bed available-approaching medical stability for discharge. Discussed at length with patient's sister [name] at bedside on 10/10.  Consultants:  General surgery   Cardiology  Critical care medicine-signed off  Nephrology-seem to have signed off  [place] - pending  Procedures:  Central venous catheter insertion procedure on 09/10/2014 >Will attempt to try for a peripheral IV line, then DC central line.  Hydrotherapy   Antibiotics:  Completed all antibiotics 9/23   Subjective: Constipation  Objective: Filed Vitals:   10/09/14 0359 10/09/14 1300 10/09/14 2300 10/10/14 0418  BP: 114/52 111/63 116/63 106/61  Pulse: 94 93 104 107  Temp: 97.8 F (36.6 C) 97.6 F (36.4 C) 99 F (37.2 C) 98.4 F (36.9 C)  TempSrc: Oral Oral Oral Oral  Resp: 18 18 19 18   Height:      Weight:      SpO2: 94% 95% 90% 90%    Intake/Output Summary (Last 24 hours) at 10/10/14 1356 Last data filed at 10/10/14 0600  Gross per 24 hour  Intake    480 ml  Output   2702 ml  Net  -2222 ml  Filed Weights   10/06/14 2300 10/08/14 0423  Weight: 138.801 kg (306 lb) 136.533 kg (301 lb)      Exam:  General exam: Moderately built and morbidly obese female lying comfortably supine in bed. Respiratory system: Clear. No increased work of breathing. Cardiovascular system: S1 & S2 heard, RRR. No JVD, murmurs, gallops, clicks. 2+ pitting bilateral leg edema/anasarca. Telemetry: A. fib with controlled ventricular rate.  Gastrointestinal system: Abdomen is nondistended, soft and nontender. Normal bowel sounds heard. Central nervous system: Alert and oriented. No focal neurological deficits. Extremities: Symmetric 5 x 5 power.Multiple bilateral lower extremity wounds-details as per PT note 10/7. Patient does have an area of mild erythema and increased warmth of right lower leg- improving.   Data Reviewed: Basic Metabolic Panel:  Recent Labs Lab 10/04/14 0400 10/05/14 0440 10/06/14 0500 10/07/14 0615 10/09/14 0605  NA 132* 135* 133* 135* 135*  K 3.3* 3.5* 3.5* 3.7 3.8  CL 93* 95* 92* 94* 93*  CO2 28 30 30 28 30   GLUCOSE 180* 125* 170* 146* 136*  BUN 100* 98* 99* 101* 97*  CREATININE 2.55* 2.52* 2.47* 2.47* 2.26*  CALCIUM 7.2* 7.3* 7.4* 7.4* 7.4*  MG 2.0 2.0 1.9  --   --   PHOS 4.8* 4.6 4.6 4.6  --    Liver Function Tests:  Recent Labs Lab 10/04/14 0400 10/05/14 0440 10/06/14 0500 10/07/14 0615  ALBUMIN 1.3* 1.4* 1.4* 1.3*   No results found for this basename: LIPASE, AMYLASE,  in the last 168 hours No results found for this basename: AMMONIA,  in the last 168 hours CBC:  Recent Labs Lab 10/07/14 0615 10/09/14 0605  WBC 8.1 8.2  HGB 8.8* 8.7*  HCT 27.5* 27.4*  MCV 98.2 98.9  PLT 300 316   Cardiac Enzymes: No results found for this basename: CKTOTAL, CKMB, CKMBINDEX, TROPONINI,  in the last 168 hours BNP (last 3 results)  Recent Labs  09/30/14 0520  PROBNP 8120.0*   CBG:  Recent Labs Lab 10/09/14 1149 10/09/14 1645 10/09/14 2127 10/10/14 0652 10/10/14 1129  GLUCAP 137* 190* 174* 174* 182*    No results found for this or any previous  visit (from the past 240 hour(s)).      Studies: No results found.      Scheduled Meds: . amiodarone  400 mg Oral Daily  . calcitRIOL  0.25 mcg Oral BID  . calcium-vitamin D  1 tablet Oral BID  . collagenase   Topical TID  . febuxostat  80 mg Oral Daily  .  HYDROmorphone (DILAUDID) injection  2 mg Intravenous Daily  . insulin aspart  0-15 Units Subcutaneous TID WC  . insulin aspart  0-5 Units Subcutaneous QHS  . insulin aspart  2 Units Subcutaneous TID WC  . insulin glargine  22 Units Subcutaneous QHS  . iron polysaccharides  150 mg Oral BID  . levothyroxine  125 mcg Oral QAC breakfast  . linagliptin  5 mg Oral Daily  . metolazone  2.5 mg Oral QODAY  . multivitamin with minerals  1 tablet Oral Daily  . potassium chloride  20 mEq Oral BID  . saccharomyces boulardii  250 mg Oral BID  . senna  2 tablet Oral BID  . sodium chloride  3 mL Intravenous Q12H  . torsemide  60 mg Oral BID  . 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    Vernell Leep, MD, FACP, FHM. [facility] Pager [phone]  If 7PM-7AM, please contact night-coverage [email or web address] Password [email or web address] 10/10/2014, 1:56 PM    LOS: 31 days

## 2014-10-10 NOTE — Progress Notes (Signed)
ANTICOAGULATION CONSULT NOTE - Follow Up Consult  Pharmacy Consult for Coumadin Indication: atrial fibrillation  Allergies  Allergen Reactions  . Ivp Dye [Iodinated Diagnostic Agents] Shortness Of Breath    Turn red, can't breathe  . Betadine [Povidone Iodine]   . Diphenhydramine Hcl Swelling    In hands and eyes  . Fish Allergy Nausea And Vomiting  . Iohexol      Desc: PT TURNS RED AND WHEEZING   . Penicillins Other (See Comments)    Resp arrest as child. Tolerates cephalosporins.  . Povidone-Iodine Other (See Comments)    Wheezing, turn red, can't breath, and blisters  . Promethazine Hcl Other (See Comments)    Low Blood Pressure  . Tape     Blisters, can use paper tape for short periods  . Clindamycin Hives and Rash    wheezing  . Iodine Rash  . Morphine Sulfate Nausea And Vomiting and Rash  . Primaxin [Imipenem] Rash    Patient Measurements: Height: 5' 6.93" (170 cm) Weight: 301 lb (136.533 kg) IBW/kg (Calculated) : 61.44 Heparin Dosing Weight:    Vital Signs: Temp: 98.4 F (36.9 C) (10/11 0418) Temp Source: Oral (10/11 0418) BP: 106/61 mmHg (10/11 0418) Pulse Rate: 107 (10/11 0418)  Labs:  Recent Labs  10/09/14 0605 10/10/14 0335  HGB 8.7*  --   HCT 27.4*  --   PLT 316  --   LABPROT 42.0* 39.5*  INR 4.40* 4.07*  CREATININE 2.26*  --     Estimated Creatinine Clearance: 35.3 ml/min (by C-G formula based on Cr of 2.26).  Assessment:  Anticoag: Coumadin PTA for hx AFib (5mg  TTSS and 2.5mg  on MWF PTA, INR 3.4 on admit then started Flagyl and INR peaked at 5), s/p Vit K 1mg  (9/13). S/p fall 9/15 but did not hit head. Amiodarone drug interaction possibe, but she was on this PTA. INR starting to trend down currently at 4.0  Goal of Therapy:  INR 2-3 Monitor platelets by anticoagulation protocol: Yes   Plan:  - Continue to hold Coumadin - f/u PT/INR, CBC in AM  Erin Hearing PharmD., BCPS Clinical Pharmacist Pager [phone] 10/10/2014 1:48 PM

## 2014-10-11 ENCOUNTER — Inpatient Hospital Stay (HOSPITAL_COMMUNITY): Payer: Medicare Other

## 2014-10-11 LAB — PROTIME-INR
INR: 3.09 — AB (ref 0.00–1.49)
Prothrombin Time: 31.9 seconds — ABNORMAL HIGH (ref 11.6–15.2)

## 2014-10-11 LAB — GLUCOSE, CAPILLARY
GLUCOSE-CAPILLARY: 155 mg/dL — AB (ref 70–99)
Glucose-Capillary: 149 mg/dL — ABNORMAL HIGH (ref 70–99)
Glucose-Capillary: 125 mg/dL — ABNORMAL HIGH (ref 70–99)
Glucose-Capillary: 117 mg/dL — ABNORMAL HIGH (ref 70–99)

## 2014-10-11 MED ORDER — WARFARIN SODIUM 1 MG PO TABS
1.0000 mg | ORAL_TABLET | Freq: Once | ORAL | Status: AC
  Administered 2014-10-11: 1 mg via ORAL
  Filled 2014-10-11 (×2): qty 1

## 2014-10-11 MED ORDER — SILVER NITRATE-POT NITRATE 75-25 % EX MISC: 1.0000 "application " | Freq: Once | CUTANEOUS | Status: DC

## 2014-10-11 MED ORDER — SILVER NITRATE-POT NITRATE 75-25 % EX MISC
1.0000 "application " | Freq: Once | CUTANEOUS | Status: DC
  Filled 2014-10-11 (×3): qty 1

## 2014-10-11 MED ORDER — FLEET ENEMA 7-19 GM/118ML RE ENEM
1.0000 | ENEMA | Freq: Once | RECTAL | Status: AC
  Administered 2014-10-11: 1 via RECTAL
  Filled 2014-10-11: qty 1

## 2014-10-11 MED ORDER — MILK AND MOLASSES ENEMA
1.0000 | Freq: Once | RECTAL | Status: DC
  Filled 2014-10-11 (×2): qty 250

## 2014-10-11 MED ORDER — FLUCONAZOLE 100 MG PO TABS
100.0000 mg | ORAL_TABLET | Freq: Every day | ORAL | Status: AC
  Administered 2014-10-11 – 2014-10-14 (×3): 100 mg via ORAL
  Filled 2014-10-11 (×5): qty 1

## 2014-10-11 NOTE — Progress Notes (Signed)
Patient ID: [name], female   DOB: 1950-12-25, 64 y.o.   MRN: [id number] .    Subjective: Pt just look miserable.  She has so much pain in her legs.  She is supposed to meet with palliative care today  Objective: Vital signs in last 24 hours: Temp:  [98.4 F (36.9 C)] 98.4 F (36.9 C) (10/12 0500) Pulse Rate:  [98-108] 98 (10/12 0500) Resp:  [18-19] 19 (10/12 0500) BP: (104-111)/(57-60) 111/60 mmHg (10/12 0500) SpO2:  [88 %-91 %] 88 % (10/12 0500) Last BM Date: 10/10/14  Intake/Output from previous day: 10/11 0701 - 10/12 0700 In: -  Out: 1275 [Urine:1275] Intake/Output this shift:    PE: Skin: wounds on bilateral thighs, all still with fibrin and necrotic tissue.  The left lateral one is mostly just yellow fat, this is not necrotic fat.  Patient with significant pain doing anything with her wounds or her legs.  Lab Results:   Recent Labs  10/09/14 0605  WBC 8.2  HGB 8.7*  HCT 27.4*  PLT 316   BMET  Recent Labs  10/09/14 0605  NA 135*  K 3.8  CL 93*  CO2 30  GLUCOSE 136*  BUN 97*  CREATININE 2.26*  CALCIUM 7.4*   PT/INR  Recent Labs  10/10/14 0335 10/11/14 0636  LABPROT 39.5* 31.9*  INR 4.07* 3.09*   CMP     Component Value Date/Time   NA 135* 10/09/2014 0605   K 3.8 10/09/2014 0605   CL 93* 10/09/2014 0605   CO2 30 10/09/2014 0605   GLUCOSE 136* 10/09/2014 0605   BUN 97* 10/09/2014 0605   CREATININE 2.26* 10/09/2014 0605   CREATININE 2.10* 05/28/2014 0001   CREATININE 1.28* 02/12/2011 1000   CALCIUM 7.4* 10/09/2014 0605   CALCIUM 9.5 03/24/2012 1212   PROT 6.4 09/22/2014 0500   ALBUMIN 1.3* 10/07/2014 0615   AST 22 09/22/2014 0500   ALT <5 09/22/2014 0500   ALKPHOS 194* 09/22/2014 0500   BILITOT 0.7 09/22/2014 0500   GFRNONAA 21* 10/09/2014 0605   GFRAA 25* 10/09/2014 0605   Lipase  No results found for this basename: lipase       Studies/Results: No results found.  Anti-infectives: Anti-infectives   Start     Dose/Rate  Route Frequency Ordered Stop   09/19/14 2000  ceFAZolin (ANCEF) IVPB 2 g/50 mL premix     2 g 100 mL/hr over 30 Minutes Intravenous Every 8 hours 09/19/14 1307 09/22/14 2032   09/13/14 1200  ceFAZolin (ANCEF) IVPB 2 g/50 mL premix  Status:  Discontinued     2 g 100 mL/hr over 30 Minutes Intravenous Every 12 hours 09/13/14 1141 09/19/14 1307   09/13/14 1000  levofloxacin (LEVAQUIN) IVPB 500 mg  Status:  Discontinued     500 mg 100 mL/hr over 60 Minutes Intravenous Every 48 hours 09/11/14 0958 09/12/14 1640   09/11/14 2300  vancomycin (VANCOCIN) 1,750 mg in sodium chloride 0.9 % 500 mL IVPB  Status:  Discontinued     1,750 mg 250 mL/hr over 120 Minutes Intravenous Every 48 hours 09/09/14 2158 09/13/14 1139   09/11/14 1800  ciprofloxacin (CIPRO) IVPB 400 mg  Status:  Discontinued     400 mg 200 mL/hr over 60 Minutes Intravenous Every 24 hours 09/10/14 1723 09/11/14 0913   09/11/14 1030  levofloxacin (LEVAQUIN) IVPB 500 mg     500 mg 100 mL/hr over 60 Minutes Intravenous  Once 09/11/14 0958 09/11/14 1141   09/10/14 1645  ciprofloxacin (  CIPRO) IVPB 400 mg     400 mg 200 mL/hr over 60 Minutes Intravenous STAT 09/10/14 1640 09/10/14 1844   09/09/14 2300  metroNIDAZOLE (FLAGYL) IVPB 500 mg  Status:  Discontinued     500 mg 100 mL/hr over 60 Minutes Intravenous Every 8 hours 09/09/14 2124 09/11/14 0915   09/09/14 2200  vancomycin (VANCOCIN) 1,500 mg in sodium chloride 0.9 % 500 mL IVPB     1,500 mg 250 mL/hr over 120 Minutes Intravenous  Once 09/09/14 2158 09/10/14 0207   09/09/14 1800  vancomycin (VANCOCIN) IVPB 1000 mg/200 mL premix     1,000 mg 200 mL/hr over 60 Minutes Intravenous  Once 09/09/14 1754 09/09/14 2042       Assessment/Plan  1. Multiple bilateral thigh wounds 2,. Multiple other medical problems  Plan: 1. Patient unsure how aggressive she wants to be with her care at this point.  She really wants to avoid OR if possible.  Would continue wound care with hydrotherapy,  santyl, and dressing changes.    LOS: 32 days    Westlynn Fifer E 10/11/2014, 8:36 AM Pager: [phone]

## 2014-10-11 NOTE — Progress Notes (Addendum)
PROGRESS NOTE    [name] YOV:[id number] DOB: 29-Jun-1950 DOA: 09/09/2014 PCP: Elby Showers, MD  HPI/Brief narrative 64 y.o. female with history of Morbid obesity, CKD 4, fibromyalgia, hypothyroidism, solitary kidney, hypothyroidism, diabetes, nonischemic cardiomyopathy with ejection fraction of 35%, atrial fibrillation who was admitted on 9/10 w/ recurrent Lower extremity cellulitis, immediately went into Septic shock- was in ICU on pressors until 9/14. Subsequently transferred to the Floor, Patient was on cefazolin from 9/14. Clinically volume overloaded with anasarca and bring diuresed per Cards.    Assessment/Plan:  1. Septic shock: Due to bilateral leg cellulitis. Required vasopressors in ICU. Resolved. 2. Multiple bilateral lower extremity wounds: Completed antibiotic course. Surgery & WOC followup appreciated. Continue hydrotherapy with PT. She will need OP follow up with plastic surgery. As per discussion with case management, if no SNF available to manage wound care, may be eligible for LTAC. Significant pain during wound care- much better controlled with Dilaudid 2 mg IV x1 prior to dressing changes. Management per surgery. 3. Acute on chronic combined systolic and diastolic CHF/anasarca: Difficult to assess volume status despite large volume diuresis. Weights have been inaccurate and not regularly recorded. Cardiology switched her to oral diuretics-torsemide and metolazone. Marked hypoalbuminemia also contributing to anasarca. Swelling of bilateral legs probably from chronic venous stasis. Cardiology signed off. Patient continues to improve. Please call the [facility] office when she is ready for discharge and cardiology will arrange a f/u appt with Dr Sallyanne Kuster or his PA/NP. Please call if cardiac issues arise. Patient's sister Ms. Kieth Brightly was upset that cardiology had signed off and demanded that Cardiology be called back to participate in care-discussed Dr. Sallyanne Kuster who will see  10/13. 4. A. Fib/s/p PPM: Controlled ventricular rate. Coumadin per pharmacy. INR 3.09. 5. Hypotension: resolved 6. 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 Continue Lantus, NovoLog mealtime and SSI. 7. Hypothyroid: Continue levothyroxine. Recent TSH 2 on 05/29/1511 8. History of gout: Stable  9. History of OHS/morbid obesity: Nightly CPAP 10. Tremors: Gabapentin discontinued. Seems to have resolved.  11. Acute on stage IV chronic kidney disease: No baseline creatinine probably in the low 2 range. Creatinine has plateaued in the 2.4-2.5 range which may be her new baseline. Periodically follow BMP-creatinine 2.26 on 10/10 12. RUE swelling: Dopplers negative for DVT. Likely from anasarca. 13. Anemia: Secondary to chronic kidney disease, acute illness and iron deficiency: Stable 14. Hypokalemia: Replaced 15. Constipation: Laxatives. Patient continued to complain of urge. X-ray suggests rectal fecal impaction. Trial of an enema. 16. Fungal rash (of back and back of arms): Seems extensive and less likely to improve from topical nystatin. Brief course of by mouth fluconazole. Aware of QTC 560 ms-discussed with primary cardiologist who suggested that QT is much longer with a paced beat and recommended going ahead with fluconazole and following daily EKGs and be concerned if QTC greater than 600.  Code Status: Full Family Communication: Discussed with patient's sister [name] at bedside. Disposition Plan: LTAC vs SNF when bed available-approaching medical stability for discharge. Discussed at length with patient's sister [name] at bedside on 10/10.  Consultants:  General surgery   Cardiology  Critical care medicine-signed off  Nephrology-seem to have signed off  [place] - pending  Procedures:  Central venous catheter insertion procedure on 09/10/2014 >Will attempt to try for a peripheral IV line, then DC central  line.  Hydrotherapy   Antibiotics:  Completed all antibiotics 9/23   Subjective: Constipation persists with mild intermittent  stools. Itching of back.  Objective: Filed Vitals:   10/10/14 0418 10/10/14 2008 10/11/14 0500 10/11/14 1025  BP: 106/61 104/57 111/60 106/66  Pulse: 107 108 98 63  Temp: 98.4 F (36.9 C) 98.4 F (36.9 C) 98.4 F (36.9 C)   TempSrc: Oral Oral Oral   Resp: 18 18 19    Height:      Weight:      SpO2: 90% 91% 88%     Intake/Output Summary (Last 24 hours) at 10/11/14 1845 Last data filed at 10/11/14 1641  Gross per 24 hour  Intake    120 ml  Output   2125 ml  Net  -2005 ml   Filed Weights   10/06/14 2300 10/08/14 0423  Weight: 138.801 kg (306 lb) 136.533 kg (301 lb)     Exam:  General exam: Moderately built and morbidly obese female lying comfortably propped up in bed. Respiratory system: Clear. No increased work of breathing. Cardiovascular system: S1 & S2 heard, RRR. No JVD, murmurs, gallops, clicks. 2+ pitting bilateral leg edema/anasarca-leg edema appears chronic.  Gastrointestinal system: Abdomen is nondistended, soft and nontender. Normal bowel sounds heard. Central nervous system: Alert and oriented. No focal neurological deficits. Extremities: Symmetric 5 x 5 power.Multiple bilateral lower extremity wounds-details as per PT note 10/7. Chronic and patchy mild bilateral lower leg faint erythema-stable. Skin: Extensive patchy redness of back and back of bilateral arms suggestive of fungal infection.  Data Reviewed: Basic Metabolic Panel:  Recent Labs Lab 10/05/14 0440 10/06/14 0500 10/07/14 0615 10/09/14 0605  NA 135* 133* 135* 135*  K 3.5* 3.5* 3.7 3.8  CL 95* 92* 94* 93*  CO2 30 30 28 30   GLUCOSE 125* 170* 146* 136*  BUN 98* 99* 101* 97*  CREATININE 2.52* 2.47* 2.47* 2.26*  CALCIUM 7.3* 7.4* 7.4* 7.4*  MG 2.0 1.9  --   --   PHOS 4.6 4.6 4.6  --    Liver Function Tests:  Recent Labs Lab 10/05/14 0440 10/06/14 0500  10/07/14 0615  ALBUMIN 1.4* 1.4* 1.3*   No results found for this basename: LIPASE, AMYLASE,  in the last 168 hours No results found for this basename: AMMONIA,  in the last 168 hours CBC:  Recent Labs Lab 10/07/14 0615 10/09/14 0605  WBC 8.1 8.2  HGB 8.8* 8.7*  HCT 27.5* 27.4*  MCV 98.2 98.9  PLT 300 316   Cardiac Enzymes: No results found for this basename: CKTOTAL, CKMB, CKMBINDEX, TROPONINI,  in the last 168 hours BNP (last 3 results)  Recent Labs  09/30/14 0520  PROBNP 8120.0*   CBG:  Recent Labs Lab 10/10/14 1608 10/10/14 2146 10/11/14 0647 10/11/14 1101 10/11/14 1628  GLUCAP 169* 180* 149* 125* 117*    No results found for this or any previous visit (from the past 240 hour(s)).      Studies: Dg Abd Portable 1v  10/11/2014   CLINICAL DATA:  64 year old female with constipation. Initial encounter.  EXAM: PORTABLE ABDOMEN - 1 VIEW  COMPARISON:  CT Abdomen and Pelvis 07/28/2014.  FINDINGS: Portable supine AP views of the abdomen and pelvis. Non obstructed bowel gas pattern. Mild motion artifact. Retained stool in the colon, specially the rectum. Retained stool in the left colon appears decreased compared to July. Stable cholecystectomy clips. Stable visualized osseous structures. No definite pneumoperitoneum on these supine views.  IMPRESSION: Non obstructed bowel gas pattern but stool ball in the rectum raising the possibility of distal fecal impaction.   Electronically Signed   By: Truman Hayward  [place] Crane M.D.   On: 10/11/2014 11:30        Scheduled Meds: . amiodarone  400 mg Oral Daily  . calcitRIOL  0.25 mcg Oral BID  . calcium-vitamin D  1 tablet Oral BID  . collagenase   Topical TID  . febuxostat  80 mg Oral Daily  . fluconazole  100 mg Oral Daily  .  HYDROmorphone (DILAUDID) injection  2 mg Intravenous Daily  . insulin aspart  0-15 Units Subcutaneous TID WC  . insulin aspart  0-5 Units Subcutaneous QHS  . insulin aspart  2 Units Subcutaneous TID WC  .  insulin glargine  22 Units Subcutaneous QHS  . iron polysaccharides  150 mg Oral BID  . levothyroxine  125 mcg Oral QAC breakfast  . linagliptin  5 mg Oral Daily  . metolazone  2.5 mg Oral QODAY  . multivitamin with minerals  1 tablet Oral Daily  . potassium chloride  20 mEq Oral BID  . saccharomyces boulardii  250 mg Oral BID  . senna  2 tablet Oral BID  . silver nitrate applicators  1 application Topical Once  . sodium chloride  3 mL Intravenous Q12H  . torsemide  60 mg Oral BID  . 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    Vernell Leep, MD, FACP, FHM. [facility] Pager [phone]  If 7PM-7AM, please contact night-coverage [email or web address] Password [email or web address] 10/11/2014, 6:45 PM    LOS: 32 days

## 2014-10-11 NOTE — Progress Notes (Signed)
ANTICOAGULATION CONSULT NOTE - Follow Up Consult  Pharmacy Consult for Coumadin Indication: atrial fibrillation  Patient Measurements: Height: 5' 6.93" (170 cm) Weight: 301 lb (136.533 kg) IBW/kg (Calculated) : 61.44  Labs:  Recent Labs  10/09/14 0605 10/10/14 0335 10/11/14 0636  HGB 8.7*  --   --   HCT 27.4*  --   --   PLT 316  --   --   LABPROT 42.0* 39.5* 31.9*  INR 4.40* 4.07* 3.09*  CREATININE 2.26*  --   --     Estimated Creatinine Clearance: 35.3 ml/min (by C-G formula based on Cr of 2.26).  Assessment:   INR remains supratherapeutic, but down to just above goal (4.07->3.09).  Expect further drop in INR by am. Last Coumadin dose was on 10/5, then held x 6 days with supratherapeutic INRs.     Home Coumadin dose was 2.5 mg on MWF and 5 mg on TTSS.  INR 3.4 on admit 09/09/14, then peaked at 5 after receiving Flagyl. Got Vitamin K 1 mg on 9/13.  On amiodarone as prior to admission.  Goal of Therapy:  INR 2-3 Monitor platelets by anticoagulation protocol: Yes   Plan:   Couamdin 1 mg x 1 today.  Continue daily PT/INR.  Arty Baumgartner, [place] Pager: [phone] 10/11/2014,2:53 PM

## 2014-10-11 NOTE — Progress Notes (Signed)
Pt refused dinner stating she wanted Rotisserie chicken and the chicken that was brought up for her takes like rubber so she didn't eat. Pt sister went down to the cafe and got pt food of her choice. Pt educated on heart healthy diet which is ordered for her. Francis Gaines Ellorie Kindall RN.

## 2014-10-11 NOTE — Progress Notes (Signed)
Physical Therapy Treatment Patient Details Name: [name] MRN: [id number] DOB: Jun 22, 1950 Today's Date: 10/11/2014    History of Present Illness 64 y.o. female  with history of fibromyalgia, hypothyroidism, solitary kidney, hypothyroidism, diabetes, nonischemic cardiomyopathy with ejection fraction of 35%, atrial fibrillation who presented to the emergency department with left-sided lower leg pain. She was admitted on 9/10 w/ recurrent Lower extremity cellulitis. PCCM asked to see in consult on 9/11 for persistent hypotension and worsening renal failure.  She had a previous admission for LE cellulitis in 07/2014 followed by [facility].  Pt had returned home with HHPT prior to this admission.     PT Comments    Pt admitted with above. Pt currently with functional limitations due to balance and endurance deficits.  Pt will benefit from skilled PT to increase their independence and safety with mobility to allow discharge to the venue listed below.   Follow Up Recommendations  LTACH;SNF;Supervision/Assistance - 24 hour     Equipment Recommendations  None recommended by PT    Recommendations for Other Services       Precautions / Restrictions Precautions Precautions: Fall Restrictions Weight Bearing Restrictions: No    Mobility  Bed Mobility Overal bed mobility: Needs Assistance;+2 for physical assistance Bed Mobility: Rolling Rolling: Mod assist;Max assist;+2 for physical assistance   Supine to sit: +2 for physical assistance;Mod assist Sit to supine: +2 for physical assistance;Total assist (+3 assist)   General bed mobility comments: Pt needed assist for LEs and for elevation of trunk to sit up and for deceleration of trunk to lie down.  +3 assist given for lying down.    Transfers Overall transfer level: Needs assistance Equipment used: Rolling walker (2 wheeled) Transfers: Sit to/from Stand Sit to Stand: Mod assist (assist of 3)         General transfer comment: Pt  stood 3 x standing 20 seconds then 20 seconds then 10 seconds.  Pt needing knees blocked with pillows with 2 persons lifting with belt from sides and third person pulling at middle with belt.  4th person was cleaning pt from behind as she had a BM.  Pt needed cues to stand upright as she flexes at trunk as well as head and neck.  Pt gives effort but needs max encouragement and has to work through the pain.     Ambulation/Gait                 Stairs            Wheelchair Mobility    Modified Rankin (Stroke Patients Only)       Balance Overall balance assessment: Needs assistance;History of Falls Sitting-balance support: Single extremity supported;Feet supported Sitting balance-Leahy Scale: Fair Sitting balance - Comments: Pt brushed her hair and washed her face seated at EOB.  Also performed some exercises UE and LE at EOB.     Standing balance support: Bilateral upper extremity supported;During functional activity Standing balance-Leahy Scale: Poor Standing balance comment: Able to stand all the way up three times with +3 assist but could not maintain over 20 seconds.                      Cognition Arousal/Alertness: Awake/alert Behavior During Therapy: Anxious Overall Cognitive Status: Within Functional Limits for tasks assessed                      Exercises General Exercises - Upper Extremity Shoulder Flexion: AROM;Strengthening;10 reps;Supine General Exercises -  Lower Extremity Long Arc Quad: AAROM;Both;10 reps;Seated    General Comments        Pertinent Vitals/Pain Pain Assessment: 0-10 Pain Score: 10-Worst pain ever Pain Location: B LEs with movement Pain Descriptors / Indicators: Aching;Sore Pain Intervention(s): Limited activity within patient's tolerance;Monitored during session;Premedicated before session;Repositioned VSS    Home Living                      Prior Function            PT Goals (current goals can now  be found in the care plan section) Progress towards PT goals: Progressing toward goals    Frequency  Min 2X/week    PT Plan Current plan remains appropriate    Co-evaluation PT/OT/SLP Co-Evaluation/Treatment: Yes Reason for Co-Treatment: For patient/therapist safety PT goals addressed during session: Mobility/safety with mobility       End of Session Equipment Utilized During Treatment: Gait belt Activity Tolerance: Patient limited by fatigue;Patient limited by pain Patient left: in bed;with call bell/phone within reach;with family/visitor present     Time: 0912-1012 PT Time Calculation (min): 60 min  Charges:  $Therapeutic Activity: 23-37 mins                    G CodesIrwin Brakeman F 2014/11/05, 10:35 AM [facility] [phone] (pager)

## 2014-10-11 NOTE — Progress Notes (Signed)
General surgery attending:  Patient interviewed and examined. Agree with assessment above. No evidence of sepsis. No surgical intervention planned at this time Continue bedside care as outlined.  [name]. Dalbert Batman, M.D., [facility] General and Minimally invasive Surgery Breast and Colorectal Surgery

## 2014-10-11 NOTE — Progress Notes (Signed)
Physical Therapy Wound Treatment Patient Details  Name: [name] MRN: [id number] Date of Birth: 10-22-1950  Today's Date: 10/11/2014 Time: 7510-2585 Time Calculation (min): 78 min  Subjective  Subjective: I just hurt all over...don't know how much more of this I can take. Patient and Family Stated Goals: decr pain in wounds and heal without surgery  Pain Score: Pain Score: 3   Wound Assessment  Pressure Ulcer 09/10/14 Stage III -  Full thickness tissue loss. Subcutaneous fat may be visible but bone, tendon or muscle are NOT exposed. 3.5 X 2 (Active)  Dressing Type Gauze (Comment) 10/09/2014  3:30 AM  Dressing Changed 10/09/2014  3:30 AM  Dressing Change Frequency Every 5 days 09/26/2014 11:00 AM  State of Healing Early/partial granulation 09/26/2014 11:00 AM  Site / Wound Assessment Bleeding 09/29/2014  8:00 PM  % Wound base Red or Granulating 100% 09/29/2014  8:00 PM  % Wound base Yellow 75% 09/26/2014 11:00 AM  % Wound base Black 0% 09/26/2014 11:00 AM  % Wound base Other (Comment) 0% 09/26/2014 11:00 AM  Peri-wound Assessment Intact;Pink 09/26/2014 11:00 AM  Wound Length (cm) 3.5 cm 09/11/2014  8:00 AM  Wound Width (cm) 2 cm 09/11/2014  8:00 AM  Margins Unattached edges (unapproximated) 09/29/2014  8:00 PM  Drainage Amount Copious 10/07/2014  5:30 AM  Drainage Description Sanguineous 10/07/2014  5:30 AM  Treatment Cleansed;Other (Comment) 10/01/2014  9:30 AM     Pressure Ulcer 09/20/14 Deep Tissue Injury - Purple or maroon localized area of discolored intact skin or blood-filled blister due to damage of underlying soft tissue from pressure and/or shear. (Active)  Dressing Type Gauze (Comment) 10/10/2014  9:30 PM  Dressing Clean;Dry;Intact 10/10/2014  9:30 PM  Dressing Change Frequency Twice a day 10/01/2014  9:30 AM  State of Healing Non-healing 09/29/2014  8:04 PM  % Wound base Red or Granulating 90% 09/29/2014  8:04 PM  % Wound base Yellow 10% 09/29/2014  8:04 PM  Margins Unattached  edges (unapproximated) 09/29/2014  8:04 PM  Drainage Amount Scant 09/29/2014  8:04 PM  Drainage Description Serosanguineous 09/29/2014  8:04 PM  Treatment Cleansed;Other (Comment) 09/29/2014  8:04 PM     Pressure Ulcer 09/20/14 Deep Tissue Injury - Purple or maroon localized area of discolored intact skin or blood-filled blister due to damage of underlying soft tissue from pressure and/or shear. (Active)  Dressing Type Gauze (Comment);ABD;Mesh briefs;Other (Comment) 10/11/2014  6:00 PM  Dressing Clean;Dry;Intact 10/11/2014  6:00 PM  Dressing Change Frequency Other (Comment) 10/11/2014  6:00 PM  State of Healing Eschar 10/11/2014  6:00 PM  Site / Wound Assessment Yellow;Painful;Granulation tissue 10/11/2014  6:00 PM  % Wound base Red or Granulating 5% 10/11/2014  6:00 PM  % Wound base Yellow 95% 10/11/2014  6:00 PM  % Wound base Black 0% 10/11/2014  6:00 PM  % Wound base Other (Comment) 0% 10/11/2014  6:00 PM  Peri-wound Assessment Pink;Intact 10/11/2014  6:00 PM  Wound Length (cm) 4.7 cm 10/07/2014 11:10 AM  Wound Width (cm) 6.5 cm 10/07/2014 11:10 AM  Wound Depth (cm) 0.4 cm 10/07/2014 11:10 AM  Margins Unattached edges (unapproximated) 10/09/2014 10:08 AM  Drainage Amount Moderate 10/11/2014  6:00 PM  Drainage Description Serous 10/11/2014  6:00 PM  Treatment Cleansed;Debridement (Selective);Hydrotherapy (Pulse lavage);Packing (Dry gauze);Other (Comment) 10/11/2014  6:00 PM     Wound 06/02/13 Abrasion(s) Leg Left (Active)  Dressing Type Gauze (Comment);ABD;Mesh briefs 10/11/2014  6:00 PM  Dressing Changed Changed 10/11/2014  6:00 PM  Dressing Status Clean;Dry;Intact  10/11/2014  6:00 PM  Dressing Change Frequency Daily 10/11/2014  6:00 PM  Site / Wound Assessment Red 10/11/2014  6:00 PM  % Wound base Red or Granulating 100% 10/11/2014  6:00 PM  % Wound base Yellow 0% 10/11/2014  6:00 PM  % Wound base Black 0% 10/11/2014  6:00 PM  % Wound base Other (Comment) 0% 10/11/2014  6:00 PM   Peri-wound Assessment Intact;Edema;Erythema (blanchable) 10/11/2014  6:00 PM  Margins Unattached edges (unapproximated) 10/11/2014  6:00 PM  Closure None 10/11/2014  6:00 PM  Drainage Amount Moderate 10/11/2014  6:00 PM  Drainage Description Serous 10/11/2014  6:00 PM  Non-staged Wound Description Full thickness 10/11/2014  6:00 PM  Treatment Cleansed 10/11/2014  6:00 PM     Wound / Incision (Open or Dehisced) 07/24/14 Incision - Open Leg Left (Active)     Wound / Incision (Open or Dehisced) 07/24/14 Incision - Open Thigh Right scabbed over circular area (Active)     Wound / Incision (Open or Dehisced) 07/29/14 Other (Comment) Thigh Left;Medial HYDRO (Active)     Wound / Incision (Open or Dehisced) 08/02/14 Other (Comment) Thigh Right;Medial Hydro, R medial thigh wound (Active)     Wound / Incision (Open or Dehisced) 09/10/14 Other (Comment) Thigh Medial;Left;Posterior;Proximal (Active)  Dressing Type Gauze (Comment);ABD;Mesh briefs;Other (Comment) 10/11/2014  6:00 PM  Dressing Changed Changed 10/11/2014  6:00 PM  Dressing Status Clean;Dry;Intact 10/11/2014  6:00 PM  Dressing Change Frequency Other (Comment) 10/09/2014 10:08 AM  Site / Wound Assessment Yellow;Granulation tissue;Painful;Pink 10/11/2014  6:00 PM  % Wound base Red or Granulating 10% 10/11/2014  6:00 PM  % Wound base Yellow 0% 10/11/2014  6:00 PM  % Wound base Black 0% 10/11/2014  6:00 PM  % Wound base Other (Comment) 0% 10/09/2014 10:08 AM  Peri-wound Assessment Edema;Other (Comment) 10/11/2014  6:00 PM  Wound Length (cm) 7.9 cm 10/07/2014 11:10 AM  Wound Width (cm) 9 cm 10/07/2014 11:10 AM  Wound Depth (cm) 0.2 cm 10/07/2014 11:10 AM  Margins Unattached edges (unapproximated) 10/11/2014  6:00 PM  Closure None 10/11/2014  6:00 PM  Drainage Amount Moderate 10/11/2014  6:00 PM  Drainage Description Serous;Odor 10/11/2014  6:00 PM  Non-staged Wound Description Full thickness 10/11/2014  6:00 PM  Treatment  Cleansed;Debridement (Selective);Hydrotherapy (Pulse lavage);Packing (Dry gauze);Hydrotherapy (Ultrasonic mist) 10/11/2014  6:00 PM     Wound / Incision (Open or Dehisced) 09/18/14 Other (Comment) Hip Right Black eschar tissue surrounded by pink tissue (Active)  Dressing Type Gauze (Comment);ABD;Mesh briefs 10/11/2014  6:00 PM  Dressing Changed Changed 10/11/2014  6:00 PM  Dressing Status Clean;Dry;Intact 10/11/2014  6:00 PM  Dressing Change Frequency Other (Comment) 10/09/2014 10:08 AM  Site / Wound Assessment Yellow;Black;Pink 10/11/2014  6:00 PM  % Wound base Red or Granulating 5% 10/11/2014  6:00 PM  % Wound base Yellow 60% 10/11/2014  6:00 PM  % Wound base Black 35% 10/11/2014  6:00 PM  % Wound base Other (Comment) 0% 10/09/2014 10:08 AM  Peri-wound Assessment Edema;Pink;Erythema (non-blanchable);Induration 10/11/2014  6:00 PM  Wound Length (cm) 8.4 cm 10/07/2014 11:10 AM  Wound Width (cm) 6.8 cm 10/07/2014 11:10 AM  Wound Depth (cm) 0.2 cm 10/07/2014 11:10 AM  Margins Unattached edges (unapproximated) 10/11/2014  6:00 PM  Closure None 10/11/2014  6:00 PM  Drainage Amount Moderate 10/11/2014  6:00 PM  Drainage Description Serous;Odor 10/11/2014  6:00 PM  Non-staged Wound Description Full thickness 10/10/2014  4:00 AM  Treatment Debridement (Selective);Hydrotherapy (Pulse lavage);Packing (Dry gauze) 10/11/2014  6:00 PM  Incision 05/05/12 Perineum Other (Comment) (Active)   Hydrotherapy Pulsed lavage therapy - wound location: Rt lateral hip, Lt lateral thigh, Lt proximal-posteriomedial thigh Pulsed Lavage with Suction (psi): 8 psi Pulsed Lavage with Suction - Normal Saline Used: 2000 mL Pulsed Lavage Tip: Tip with splash shield Selective Debridement Selective Debridement - Location: Rt lateral hip, Lt lateral thigh, Lt proximal-posteriomedial thigh Selective Debridement - Tools Used: Scissors;Scalpel;Forceps Selective Debridement - Tissue Removed: yellow/brown eschar, yellow  slough, fatty tissue   Wound Assessment and Plan  Wound Therapy - Assess/Plan/Recommendations Wound Therapy - Clinical Statement: Patient's wounds still mostly eschar, debridement is slow secondary to involvement and pain.  Patient in good spirits today.  Patient will continue to benefit from hydrotherapy to cleanse wounds and promote healing and this will be a long, steady process.   Wound Therapy - Functional Problem List: painful wounds limiting her mobility tolerance Factors Delaying/Impairing Wound Healing: Diabetes Mellitus;Infection - systemic/local;Immobility;Multiple medical problems;Vascular compromise Hydrotherapy Plan: Debridement;Dressing change;Patient/family education;Pulsatile lavage with suction Wound Therapy - Frequency: 6X / week Wound Therapy - Current Recommendations: WOC nurse Wound Therapy - Follow Up Recommendations: Other (comment) Wound Plan: see above  Wound Therapy Goals- Improve the function of patient's integumentary system by progressing the wound(s) through the phases of wound healing (inflammation - proliferation - remodeling) by: Decrease Necrotic Tissue to: <75% Decrease Necrotic Tissue - Progress: Progressing toward goal Increase Granulation Tissue to: >25% Increase Granulation Tissue - Progress: Progressing toward goal Improve Drainage Characteristics: Min Improve Drainage Characteristics - Progress: Not progressing Wound Therapy - Potential for Goals: Good  Goals will be updated until maximal potential achieved or discharge criteria met.  Discharge criteria: when goals achieved, discharge from hospital, MD decision/surgical intervention, no progress towards goals, refusal/missing three consecutive treatments without notification or medical reason.  GP     Gatlyn Lipari, Tessie Fass 10/11/2014, 6:24 PM 10/11/2014  Donnella Sham, PT [phone]  (pager)

## 2014-10-11 NOTE — Progress Notes (Signed)
Occupational Therapy Treatment Patient Details Name: [name] MRN: [id number] DOB: [birth year] Today's Date: 10/11/2014    History of present illness 64 y.o. female  with history of fibromyalgia, hypothyroidism, solitary kidney, hypothyroidism, diabetes, nonischemic cardiomyopathy with ejection fraction of 35%, atrial fibrillation who presented to the emergency department with left-sided lower leg pain. She was admitted on 9/10 w/ recurrent Lower extremity cellulitis. PCCM asked to see in consult on 9/11 for persistent hypotension and worsening renal failure.  She had a previous admission for LE cellulitis in 07/2014 followed by [facility].  Pt had returned home with HHPT prior to this admission.    OT comments  Pt seen today for functional mobility and ADLs sitting EOB. Pt required max encouragement for OOB and became frustrated due to pain, however participated in session with PT/OT. Pt continues to be limited by weakness, pain, and body habitus for ADLs and functional mobility and would benefit from further rehab at d/c at venue below.    Follow Up Recommendations  LTACH;SNF    Equipment Recommendations  None recommended by OT    Recommendations for Other Services      Precautions / Restrictions Precautions Precautions: Fall Restrictions Weight Bearing Restrictions: No       Mobility Bed Mobility Overal bed mobility: Needs Assistance;+2 for physical assistance Bed Mobility: Rolling Rolling: Mod assist;Max assist;+2 for physical assistance   Supine to sit: +2 for physical assistance;Mod assist Sit to supine: +2 for physical assistance;Total assist (+3 assist)   General bed mobility comments: Pt needed assist for LEs and for elevation of trunk to sit up and for deceleration of trunk to lie down.  +3 assist given for lying down.    Transfers Overall transfer level: Needs assistance Equipment used: Rolling walker (2 wheeled) Transfers: Sit to/from Stand Sit to Stand: Mod  assist (assist of 3)         General transfer comment: Pt stood 3 x standing 20 seconds then 20 seconds then 10 seconds.  Pt needing knees blocked with pillows with 2 persons lifting with belt from sides and third person pulling at middle with belt.  4th person was cleaning pt from behind as she had a BM.  Pt needed cues to stand upright as she flexes at trunk as well as head and neck.  Pt gives effort but needs max encouragement and has to work through the pain.       Balance Overall balance assessment: Needs assistance Sitting-balance support: Single extremity supported;Feet supported Sitting balance-Leahy Scale: Fair Sitting balance - Comments: Pt brushed her hair and washed her face seated at EOB.  Also performed some exercises UE and LE at EOB.     Standing balance support: Bilateral upper extremity supported;During functional activity Standing balance-Leahy Scale: Poor Standing balance comment: Able to stand all the way up three times with +3 assist but could not maintain over 20 seconds.                    ADL Overall ADL's : Needs assistance/impaired     Grooming: Wash/dry face;Brushing hair;Set up;Supervision/safety;Sitting (EOB)                       Toileting- Clothing Manipulation and Hygiene: +2 for physical assistance;Total assistance;Sit to/from stand (+2 to stand; additional person to perform pericare) Toileting - Clothing Manipulation Details (indicate cue type and reason): Pt performed sit<>stand with +2 physical assist with additional person for safety and one person to  perform pericare. Pt performed sit<>stand x3 for full pericare and had bowel movement during transfers. RN notified.       General ADL Comments: Pt requires max encouragement for OOB activities, however does move surprisingly well given body habitus and time spent in bed.                 Cognition  Arousal/Alertness: Awake/Alert Behavior During Therapy: Anxious Overall  Cognitive Status: Within Functional Limits for tasks assessed                         Exercises General Exercises - Upper Extremity Shoulder Flexion: AROM;Strengthening;10 reps;Seated            Pertinent Vitals/ Pain       Pain Assessment: 0-10 Pain Score: 10-Worst pain ever Pain Location: B LEs with movement Pain Descriptors / Indicators: Aching;Sore Pain Intervention(s): Limited activity within patient's tolerance;Monitored during session;Premedicated before session;Repositioned         Frequency Min 2X/week     Progress Toward Goals  OT Goals(current goals can now be found in the care plan section)  Progress towards OT goals: Progressing toward goals  ADL Goals Pt Will Perform Grooming: sitting;with modified independence (EOB with trunk support) Pt Will Perform Upper Body Bathing: with mod assist;sitting Pt Will Perform Lower Body Bathing: with mod assist;bed level Additional ADL Goal #1: Pt will move to EOB sitting with min A in prep for BADL activity  Additional ADL Goal #2: Pt will move sit to stand with mod A +2 in prep for toilet transfers.   Plan Discharge plan remains appropriate    Co-evaluation    PT/OT/SLP Co-Evaluation/Treatment: Yes Reason for Co-Treatment: For patient/therapist safety PT goals addressed during session: Mobility/safety with mobility OT goals addressed during session: ADL's and self-care;Strengthening/ROM      End of Session Equipment Utilized During Treatment: Gait belt;Rolling walker   Activity Tolerance Patient limited by pain   Patient Left in bed;with call bell/phone within reach (in sitting position)   Nurse Communication Mobility status;Other (comment) (pt had BM)        Time: 0912-1012 OT Time Calculation (min): 60 min  Charges: OT General Charges $OT Visit: 1 Procedure OT Treatments $Self Care/Home Management : 8-22 mins $Therapeutic Activity: 8-22 mins  Juluis Rainier 10/11/2014, 1:49  PM   Cyndie Chime, OTR/L Occupational Therapist [phone] (pager)

## 2014-10-12 DIAGNOSIS — K5641 Fecal impaction: Secondary | ICD-10-CM

## 2014-10-12 LAB — PROTIME-INR
INR: 3.57 — ABNORMAL HIGH (ref 0.00–1.49)
Prothrombin Time: 35.7 seconds — ABNORMAL HIGH (ref 11.6–15.2)

## 2014-10-12 LAB — MAGNESIUM: MAGNESIUM: 1.8 mg/dL (ref 1.5–2.5)

## 2014-10-12 LAB — GLUCOSE, CAPILLARY
GLUCOSE-CAPILLARY: 105 mg/dL — AB (ref 70–99)
Glucose-Capillary: 107 mg/dL — ABNORMAL HIGH (ref 70–99)
Glucose-Capillary: 147 mg/dL — ABNORMAL HIGH (ref 70–99)
Glucose-Capillary: 141 mg/dL — ABNORMAL HIGH (ref 70–99)

## 2014-10-12 LAB — CBC
HEMATOCRIT: 28 % — AB (ref 36.0–46.0)
Hemoglobin: 8.9 g/dL — ABNORMAL LOW (ref 12.0–15.0)
MCH: 31.9 pg (ref 26.0–34.0)
MCHC: 31.8 g/dL (ref 30.0–36.0)
MCV: 100.4 fL — ABNORMAL HIGH (ref 78.0–100.0)
Platelets: 295 10*3/uL (ref 150–400)
RBC: 2.79 MIL/uL — ABNORMAL LOW (ref 3.87–5.11)
RDW: 19.6 % — AB (ref 11.5–15.5)
WBC: 10.8 10*3/uL — AB (ref 4.0–10.5)

## 2014-10-12 LAB — COMPREHENSIVE METABOLIC PANEL
ALBUMIN: 1.4 g/dL — AB (ref 3.5–5.2)
ALK PHOS: 149 U/L — AB (ref 39–117)
ALT: 17 U/L (ref 0–35)
AST: 32 U/L (ref 0–37)
Anion gap: 11 (ref 5–15)
BILIRUBIN TOTAL: 1.1 mg/dL (ref 0.3–1.2)
BUN: 90 mg/dL — ABNORMAL HIGH (ref 6–23)
CHLORIDE: 93 meq/L — AB (ref 96–112)
CO2: 35 mEq/L — ABNORMAL HIGH (ref 19–32)
Calcium: 8 mg/dL — ABNORMAL LOW (ref 8.4–10.5)
Creatinine, Ser: 2 mg/dL — ABNORMAL HIGH (ref 0.50–1.10)
GFR calc Af Amer: 29 mL/min — ABNORMAL LOW (ref >90)
GFR calc non Af Amer: 25 mL/min — ABNORMAL LOW (ref >90)
Glucose, Bld: 98 mg/dL (ref 70–99)
POTASSIUM: 3.5 meq/L — AB (ref 3.7–5.3)
SODIUM: 139 meq/L (ref 137–147)
Total Protein: 6.2 g/dL (ref 6.0–8.3)

## 2014-10-12 MED ORDER — POTASSIUM CHLORIDE CRYS ER 20 MEQ PO TBCR
20.0000 meq | EXTENDED_RELEASE_TABLET | Freq: Once | ORAL | Status: AC
  Administered 2014-10-12: 20 meq via ORAL

## 2014-10-12 NOTE — Progress Notes (Signed)
Pt is refusing to wear CPAP for a few nights now. RT will continue to monitor.

## 2014-10-12 NOTE — Progress Notes (Signed)
Thank you for consulting the Palliative Medicine Team at [facility] to meet your patient's and family's needs.   The reason that you asked [place] to see your patient is  For Clarification of GOC and options  We have scheduled your patient for a meeting: Tomorrow 10-13-14  At 1pm  Other family members that need to be present: Husband and sister  Wadie Lessen NP  Palliative Medicine Team Team Phone # [phone] Pager [phone]

## 2014-10-12 NOTE — Progress Notes (Signed)
ANTICOAGULATION CONSULT NOTE - Follow Up Consult  Pharmacy Consult for Coumadin Indication: atrial fibrillation  Patient Measurements: Height: 5' 6.93" (170 cm) Weight: 301 lb (136.533 kg) IBW/kg (Calculated) : 61.44  Labs:  Recent Labs  10/10/14 0335 10/11/14 0636 10/12/14 0510  HGB  --   --  8.9*  HCT  --   --  28.0*  PLT  --   --  295  LABPROT 39.5* 31.9* 35.7*  INR 4.07* 3.09* 3.57*  CREATININE  --   --  2.00*    Estimated Creatinine Clearance: 39.9 ml/min (by C-G formula based on Cr of 2).  Assessment:   INR remains supratherapeutic and has increased from 3.09 to 3.57 after Coumadin 1 mg on 10/11/14. Coumadin had been held x 6 days prior to that, with supratherapeutic INRs.     Home Coumadin dose was 2.5 mg on MWF and 5 mg on TTSS.  INR 3.4 on admit 09/09/14, then peaked at 5 after receiving Flagyl. Got Vitamin K 1 mg on 9/13.  On amiodarone as prior to admission.  Fluconazole added 10/12, but unlikely to have already contributed to increased INR today.  Goal of Therapy:  INR 2-3 Monitor platelets by anticoagulation protocol: Yes   Plan:   No Coumadin today.  Continue daily PT/INR.  Arty Baumgartner, Ellis Pager: [phone] 10/12/2014,1:37 PM

## 2014-10-12 NOTE — Progress Notes (Signed)
Physical Therapy Treatment Patient Details Name: [name] MRN: [id number] DOB: May 27, 1950 Today's Date: 10/12/2014    History of Present Illness 64 y.o. female  with history of fibromyalgia, hypothyroidism, solitary kidney, hypothyroidism, diabetes, nonischemic cardiomyopathy with ejection fraction of 35%, atrial fibrillation who presented to the emergency department with left-sided lower leg pain. She was admitted on 9/10 w/ recurrent Lower extremity cellulitis. PCCM asked to see in consult on 9/11 for persistent hypotension and worsening renal failure.  She had a previous admission for LE cellulitis in 07/2014 followed by [facility].  Pt had returned home with HHPT prior to this admission.     PT Comments    Pt admitted with above. Pt currently with functional limitations due to balance and endurance  deficits.   Pt will benefit from skilled PT to increase their independence and safety with mobility to allow discharge to the venue listed below.   Follow Up Recommendations  LTACH;SNF;Supervision/Assistance - 24 hour     Equipment Recommendations  None recommended by PT    Recommendations for Other Services       Precautions / Restrictions Precautions Precautions: Fall Restrictions Weight Bearing Restrictions: No    Mobility  Bed Mobility   Bed Mobility: Rolling Rolling: Mod assist            Transfers                    Ambulation/Gait                 Stairs            Wheelchair Mobility    Modified Rankin (Stroke Patients Only)       Balance                                    Cognition Arousal/Alertness: Awake/alert Behavior During Therapy: Anxious Overall Cognitive Status: Within Functional Limits for tasks assessed                      Exercises General Exercises - Upper Extremity Shoulder Flexion: AROM;Strengthening;Both;10 reps;Seated;Bar weights/barbell;Theraband Theraband Level (Shoulder  Flexion): Level 1 (Yellow);Level 2 (Red) Bar Weights/Barbell (Shoulder Flexion): 3 lbs Shoulder Extension: AROM;Strengthening;Both;10 reps;Seated;Bar weights/barbell;Theraband Theraband Level (Shoulder Extension): Level 1 (Yellow);Level 2 (Red) Bar Weights/Barbell (Shoulder Extension): 3 lbs Shoulder ABduction: AROM;Both;10 reps;Strengthening;Seated;Theraband Theraband Level (Shoulder Abduction): Level 2 (Red);Level 1 (Yellow) Shoulder Horizontal ABduction: AROM;Strengthening;Both;10 reps;Seated;Theraband Theraband Level (Shoulder Horizontal Abduction): Level 1 (Yellow);Level 2 (Red) Shoulder Horizontal ADduction: AROM;Strengthening;Both;10 reps;Seated;Theraband Theraband Level (Shoulder Horizontal Adduction): Level 1 (Yellow);Level 2 (Red) Elbow Flexion: AROM;Strengthening;Both;10 reps;Seated;Bar weights/barbell;Theraband Theraband Level (Elbow Flexion): Level 1 (Yellow);Level 2 (Red) Bar Weights/Barbell (Elbow Flexion): 3 lbs General Exercises - Lower Extremity Ankle Circles/Pumps: AROM;Strengthening;Both;Supine;20 reps Quad Sets: AROM;Both;Supine;15 reps Gluteal Sets: AROM;Both;Supine;15 reps Heel Slides: Both;10 reps;Supine;AAROM;Other (comment) Hip ABduction/ADduction: AAROM;Both;10 reps;Supine Other Exercises Other Exercises: Pt performed 15 reps of pec press with 3 lb barbell.    General Comments        Pertinent Vitals/Pain Pain Assessment: 0-10 Faces Pain Scale: Hurts little more Pain Location: B LES Pain Descriptors / Indicators: Aching;Sore Pain Intervention(s): Limited activity within patient's tolerance;Monitored during session;Repositioned VSS    Home Living                      Prior Function            PT Goals (current  goals can now be found in the care plan section) Progress towards PT goals: Progressing toward goals    Frequency  Min 2X/week    PT Plan Current plan remains appropriate    Co-evaluation             End of Session    Activity Tolerance: Patient limited by fatigue;Patient limited by pain Patient left: in bed;with call bell/phone within reach     Time: 0900-0932 PT Time Calculation (min): 32 min  Charges:  $Therapeutic Exercise: 23-37 mins                    G CodesIrwin Brakeman F 20-Oct-2014, 11:08 AM [facility] [phone] (pager)

## 2014-10-12 NOTE — Progress Notes (Signed)
Patients rectum was impacted with stool. Patient complaining of pressure. Assisted patient to bear down and I was able to pull 3 massive chunks of stool as it exited rectum. Bloody streaks due to size and consistency.  Each chunk was larger than 2 fists together.

## 2014-10-12 NOTE — Progress Notes (Signed)
Physical Therapy Wound Treatment Patient Details  Name: [name] MRN: [id number] Date of Birth: 1950/08/27  Today's Date: 10/12/2014 Time: 1410-1520 Time Calculation (min): 70 min  Subjective  Subjective: I just gave birth to 4 3 lb babies.Marland KitchenMarland KitchenConstipation is (no joke) Patient and Family Stated Goals: decr pain in wounds and heal without surgery  Pain Score:    Wound Assessment  Pressure Ulcer 09/10/14 Stage III -  Full thickness tissue loss. Subcutaneous fat may be visible but bone, tendon or muscle are NOT exposed. 3.5 X 2 (Active)  Dressing Type Gauze (Comment) 10/09/2014  3:30 AM  Dressing Changed 10/09/2014  3:30 AM  Dressing Change Frequency Every 5 days 09/26/2014 11:00 AM  State of Healing Early/partial granulation 09/26/2014 11:00 AM  Site / Wound Assessment Bleeding 09/29/2014  8:00 PM  % Wound base Red or Granulating 100% 09/29/2014  8:00 PM  % Wound base Yellow 75% 09/26/2014 11:00 AM  % Wound base Black 0% 09/26/2014 11:00 AM  % Wound base Other (Comment) 0% 09/26/2014 11:00 AM  Peri-wound Assessment Intact;Pink 09/26/2014 11:00 AM  Wound Length (cm) 3.5 cm 09/11/2014  8:00 AM  Wound Width (cm) 2 cm 09/11/2014  8:00 AM  Margins Unattached edges (unapproximated) 09/29/2014  8:00 PM  Drainage Amount Copious 10/07/2014  5:30 AM  Drainage Description Sanguineous 10/07/2014  5:30 AM  Treatment Cleansed;Other (Comment) 10/01/2014  9:30 AM     Pressure Ulcer 09/20/14 Deep Tissue Injury - Purple or maroon localized area of discolored intact skin or blood-filled blister due to damage of underlying soft tissue from pressure and/or shear. (Active)  Dressing Type Gauze (Comment) 10/12/2014  8:11 AM  Dressing Clean;Dry;Intact 10/12/2014  8:11 AM  Dressing Change Frequency Twice a day 10/01/2014  9:30 AM  State of Healing Non-healing 09/29/2014  8:04 PM  % Wound base Red or Granulating 90% 09/29/2014  8:04 PM  % Wound base Yellow 10% 09/29/2014  8:04 PM  Margins Unattached edges  (unapproximated) 09/29/2014  8:04 PM  Drainage Amount Scant 09/29/2014  8:04 PM  Drainage Description Serosanguineous 09/29/2014  8:04 PM  Treatment Cleansed;Other (Comment) 09/29/2014  8:04 PM     Pressure Ulcer 09/20/14 Deep Tissue Injury - Purple or maroon localized area of discolored intact skin or blood-filled blister due to damage of underlying soft tissue from pressure and/or shear. (Active)  Dressing Type Gauze (Comment);ABD;Mesh briefs;Other (Comment) 10/12/2014  3:31 PM  Dressing Clean;Dry;Intact 10/12/2014  3:31 PM  Dressing Change Frequency Other (Comment) 10/12/2014  3:31 PM  State of Healing Eschar 10/12/2014  3:31 PM  Site / Wound Assessment Yellow;Painful;Granulation tissue 10/12/2014  3:31 PM  % Wound base Red or Granulating 5% 10/12/2014  3:31 PM  % Wound base Yellow 95% 10/12/2014  3:31 PM  % Wound base Black 0% 10/12/2014  3:31 PM  % Wound base Other (Comment) 0% 10/12/2014  3:31 PM  Peri-wound Assessment Pink;Intact 10/12/2014  3:31 PM  Wound Length (cm) 4.7 cm 10/07/2014 11:10 AM  Wound Width (cm) 6.5 cm 10/07/2014 11:10 AM  Wound Depth (cm) 0.4 cm 10/07/2014 11:10 AM  Margins Unattached edges (unapproximated) 10/09/2014 10:08 AM  Drainage Amount Moderate 10/12/2014  3:31 PM  Drainage Description Serous 10/12/2014  3:31 PM  Treatment Cleansed;Debridement (Selective);Hydrotherapy (Pulse lavage);Packing (Dry gauze);Other (Comment) 10/11/2014  6:00 PM     Wound 06/02/13 Abrasion(s) Leg Left (Active)  Dressing Type Gauze (Comment);ABD;Mesh briefs 10/12/2014  3:31 PM  Dressing Changed Changed 10/11/2014  6:00 PM  Dressing Status Clean;Dry;Intact 10/12/2014  3:31 PM  Dressing Change Frequency Daily 10/12/2014  3:31 PM  Site / Wound Assessment Red 10/12/2014  3:31 PM  % Wound base Red or Granulating 100% 10/12/2014  3:31 PM  % Wound base Yellow 0% 10/12/2014  3:31 PM  % Wound base Black 0% 10/12/2014  3:31 PM  % Wound base Other (Comment) 0% 10/12/2014  3:31 PM  Peri-wound  Assessment Intact;Edema;Erythema (blanchable) 10/12/2014  3:31 PM  Margins Unattached edges (unapproximated) 10/12/2014  3:31 PM  Closure None 10/12/2014  3:31 PM  Drainage Amount Moderate 10/12/2014  3:31 PM  Drainage Description Serous 10/12/2014  3:31 PM  Non-staged Wound Description Full thickness 10/12/2014  3:31 PM  Treatment Cleansed 10/11/2014  6:00 PM     Wound / Incision (Open or Dehisced) 07/24/14 Incision - Open Leg Left (Active)     Wound / Incision (Open or Dehisced) 07/24/14 Incision - Open Thigh Right scabbed over circular area (Active)     Wound / Incision (Open or Dehisced) 07/29/14 Other (Comment) Thigh Left;Medial HYDRO (Active)     Wound / Incision (Open or Dehisced) 08/02/14 Other (Comment) Thigh Right;Medial Hydro, R medial thigh wound (Active)     Wound / Incision (Open or Dehisced) 09/10/14 Other (Comment) Thigh Medial;Left;Posterior;Proximal (Active)  Dressing Type Gauze (Comment);ABD;Mesh briefs;Other (Comment) 10/12/2014  3:31 PM  Dressing Changed Changed 10/11/2014  6:00 PM  Dressing Status Clean;Dry;Intact 10/12/2014  3:31 PM  Dressing Change Frequency Other (Comment) 10/09/2014 10:08 AM  Site / Wound Assessment Yellow;Granulation tissue;Painful;Pink 10/12/2014  3:31 PM  % Wound base Red or Granulating 0% 10/12/2014  3:31 PM  % Wound base Yellow 100% 10/12/2014  3:31 PM  % Wound base Black 0% 10/12/2014  3:31 PM  % Wound base Other (Comment) 0% 10/09/2014 10:08 AM  Peri-wound Assessment Edema;Other (Comment) 10/12/2014  3:31 PM  Wound Length (cm) 7.9 cm 10/07/2014 11:10 AM  Wound Width (cm) 9 cm 10/07/2014 11:10 AM  Wound Depth (cm) 0.2 cm 10/07/2014 11:10 AM  Margins Unattached edges (unapproximated) 10/12/2014  3:31 PM  Closure None 10/12/2014  3:31 PM  Drainage Amount Copious 10/12/2014  3:31 PM  Drainage Description Odor;Serosanguineous 10/12/2014  3:31 PM  Non-staged Wound Description Full thickness 10/12/2014  3:31 PM  Treatment  Cleansed;Hydrotherapy (Pulse lavage);Packing (Dry gauze);Debridement (Selective) 10/12/2014  3:31 PM     Wound / Incision (Open or Dehisced) 09/18/14 Other (Comment) Hip Right Black eschar tissue surrounded by pink tissue (Active)  Dressing Type Gauze (Comment);ABD;Mesh briefs 10/12/2014  3:31 PM  Dressing Changed Changed 10/11/2014  6:00 PM  Dressing Status Clean;Dry;Intact 10/12/2014  3:31 PM  Dressing Change Frequency Other (Comment) 10/09/2014 10:08 AM  Site / Wound Assessment Yellow;Black 10/12/2014  3:31 PM  % Wound base Red or Granulating 5% 10/12/2014  3:31 PM  % Wound base Yellow 60% 10/12/2014  3:31 PM  % Wound base Black 35% 10/12/2014  3:31 PM  % Wound base Other (Comment) 0% 10/09/2014 10:08 AM  Peri-wound Assessment Edema;Pink;Erythema (non-blanchable);Induration 10/12/2014  3:31 PM  Wound Length (cm) 8.4 cm 10/07/2014 11:10 AM  Wound Width (cm) 6.8 cm 10/07/2014 11:10 AM  Wound Depth (cm) 0.2 cm 10/07/2014 11:10 AM  Margins Unattached edges (unapproximated) 10/12/2014  3:31 PM  Closure None 10/12/2014  3:31 PM  Drainage Amount Moderate 10/12/2014  3:31 PM  Drainage Description Odor;Serosanguineous 10/12/2014  3:31 PM  Non-staged Wound Description Full thickness 10/10/2014  4:00 AM  Treatment Debridement (Selective);Hydrotherapy (Pulse lavage);Packing (Dry gauze) 10/11/2014  6:00 PM     Incision 05/05/12 Perineum Other (  Comment) (Active)   Hydrotherapy Pulsed lavage therapy - wound location: Rt lateral hip, Lt lateral thigh, Lt proximal-posteriomedial thigh Pulsed Lavage with Suction (psi): 8 psi Pulsed Lavage with Suction - Normal Saline Used: 2000 mL Pulsed Lavage Tip: Tip with splash shield Selective Debridement Selective Debridement - Location: Rt lateral hip, Lt lateral thigh, Lt proximal-posteriomedial thigh Selective Debridement - Tools Used: Scissors;Scalpel;Forceps Selective Debridement - Tissue Removed: yellow/brown eschar, yellow slough, fatty tissue   Wound  Assessment and Plan  Wound Therapy - Assess/Plan/Recommendations Wound Therapy - Clinical Statement: Patient's wounds still mostly eschar, debridement is slow secondary to involvement and pain.  Patient in good spirits today.  Patient will continue to benefit from hydrotherapy to cleanse wounds and promote healing and this will be a long, steady process.   Wound Therapy - Functional Problem List: painful wounds limiting her mobility tolerance Factors Delaying/Impairing Wound Healing: Diabetes Mellitus;Infection - systemic/local;Immobility;Multiple medical problems;Vascular compromise Hydrotherapy Plan: Debridement;Dressing change;Patient/family education;Pulsatile lavage with suction Wound Therapy - Frequency: 6X / week Wound Therapy - Current Recommendations: WOC nurse Wound Therapy - Follow Up Recommendations: Other (comment) Wound Plan: see above  Wound Therapy Goals- Improve the function of patient's integumentary system by progressing the wound(s) through the phases of wound healing (inflammation - proliferation - remodeling) by: Decrease Necrotic Tissue to: <75% Decrease Necrotic Tissue - Progress: Progressing toward goal Increase Granulation Tissue to: >25% Increase Granulation Tissue - Progress: Progressing toward goal Improve Drainage Characteristics: Min Improve Drainage Characteristics - Progress: Not progressing Time For Goal Achievement: 7 days Wound Therapy - Potential for Goals: Good  Goals will be updated until maximal potential achieved or discharge criteria met.  Discharge criteria: when goals achieved, discharge from hospital, MD decision/surgical intervention, no progress towards goals, refusal/missing three consecutive treatments without notification or medical reason.  GP     Auryn Paige, Tessie Fass 10/12/2014, 3:40 PM 10/12/2014  Donnella Sham, [place] [phone]  (pager)

## 2014-10-12 NOTE — Progress Notes (Addendum)
PROGRESS NOTE    [name] DZH:[id number] DOB: [birth year] DOA: 09/09/2014 PCP: Elby Showers, MD  HPI/Brief narrative 64 y.o. female with history of Morbid obesity, CKD 4, fibromyalgia, hypothyroidism, solitary kidney, hypothyroidism, diabetes, nonischemic cardiomyopathy with ejection fraction of 35%, atrial fibrillation who was admitted on 9/10 w/ recurrent Lower extremity cellulitis, immediately went into Septic shock- was in ICU on pressors until 9/14. Subsequently transferred to the Floor. Has completed antibiotics. Undergoing wound care/hydrotherapy per PT under [facility] supervision. Cardiology had signed off-family requested reconsult. Palliative care team consulted for goals of care. Discharged disposition pending-case management working on it.   Assessment/Plan:  1. Septic shock: Due to bilateral leg cellulitis. Required vasopressors in ICU. Resolved. 2. Multiple bilateral lower extremity wounds: Completed antibiotic course. Surgery & WOC followup appreciated. Continue hydrotherapy with PT. She will need OP follow up with plastic surgery. As per discussion with case management, if no SNF available to manage wound care, may be eligible for LTAC. Significant pain during wound care- much better controlled with Dilaudid 2 mg IV x1 prior to dressing changes. Management per surgery. 3. Acute on chronic combined systolic and diastolic CHF/anasarca: Difficult to assess volume status despite large volume diuresis. Weights have been inaccurate and not regularly recorded. Cardiology switched her to oral diuretics-torsemide and metolazone. Marked hypoalbuminemia also contributing to anasarca. Swelling of bilateral legs probably from chronic venous stasis. Cardiology signed off. Patient continues to improve. Please call the [facility] office when she is ready for discharge and cardiology will arrange a f/u appt with Dr Sallyanne Kuster or his PA/NP. Please call if cardiac issues arise. Patient's sister Ms. Kieth Brightly  was upset that cardiology had signed off and demanded that the Cardiologists come back to participate in care-discussed Dr. Sallyanne Kuster who will see 10/13. Stable. Placed back on telemetry on 10/12 due to concern for arrhythmias while on fluconazole. 4. A. Fib/s/p PPM: Controlled ventricular rate. Coumadin per pharmacy. INR 3.57. 5. Hypotension: resolved 6. 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 Continue Lantus, NovoLog mealtime and SSI. 7. Hypothyroid: Continue levothyroxine. Recent TSH 2 on 05/29/1511 8. History of gout: Stable  9. History of OHS/morbid obesity: Nightly CPAP 10. Tremors: Gabapentin discontinued. Seems to have resolved.  11. Acute on stage IV chronic kidney disease: No baseline creatinine probably in the low 2 range. Creatinine gradually improving. Follow BMP periodically. 12. RUE swelling: Dopplers negative for DVT. Likely from anasarca. 13. Anemia: Secondary to chronic kidney disease, acute illness and iron deficiency: Stable 14. Hypokalemia: Replaced 15. Fecal impaction/Constipation: Significant relief after digital disimpaction and enema 10/13. She wishes to continue Colace and senna but declines MiraLAX. Likely multifactorial from immobility, pain medications, poor diet and iron supplements. 16. Fungal rash (of back and back of arms): Seems extensive and less likely to improve from topical nystatin. Brief course of by mouth fluconazole-DC after 5 days. Aware of QTC 560 ms-discussed with primary cardiologist on 10/12 who suggested that QT is much longer with a paced beat and recommended going ahead with fluconazole and following daily EKGs and be concerned if QTC greater than 600. QTC 10/13 is 554-stable 17. Prolonged QTC: Please see discussion in #16. Try to keep potassium greater than >4 and magnesium >2  Code Status: Full Family Communication: Discussed with patient's sister [name] at bedside. Disposition Plan: LTAC vs SNF when bed  available-approaching medical stability for discharge. Discussed at length with patient's sister [name] at bedside on 10/13.  Consultants:  General surgery   Cardiology  Critical care medicine-signed off  Nephrology-signed off  [place] - pending-discussed with the palliative care team and will try to arrange meeting today.  Procedures:  Central venous catheter insertion procedure on 09/10/2014 >Will attempt to try for a peripheral IV line, then DC central line.  Hydrotherapy   Antibiotics:  Completed all antibiotics 9/23   Subjective: Feels much better. No further abdominal or rectal pressure after significant BM following enema & decreased itching of back.  Objective: Filed Vitals:   10/11/14 1025 10/11/14 2128 10/12/14 0407 10/12/14 1300  BP: 106/66 102/68 134/99 110/53  Pulse: 63 110 95 76  Temp:  98.3 F (36.8 C) 98.5 F (36.9 C) 98.3 F (36.8 C)  TempSrc:  Oral Oral Oral  Resp:  18 20 18   Height:      Weight:      SpO2:  94% 90% 90%    Intake/Output Summary (Last 24 hours) at 10/12/14 1508 Last data filed at 10/12/14 1422  Gross per 24 hour  Intake    243 ml  Output   3151 ml  Net  -2908 ml   Filed Weights   10/06/14 2300 10/08/14 0423  Weight: 138.801 kg (306 lb) 136.533 kg (301 lb)     Exam:  General exam: Moderately built and morbidly obese female lying comfortably propped up in bed. Appears pleasant and cheerful this morning. Respiratory system: Clear. No increased work of breathing. Cardiovascular system: S1 & S2 heard, RRR. No JVD, murmurs, gallops, clicks. 2+ pitting bilateral leg edema/anasarca-leg edema appears chronic. Telemetry: A. fib with ventricular rate in the 90s. Gastrointestinal system: Abdomen is nondistended, soft and nontender. Normal bowel sounds heard. Central nervous system: Alert and oriented. No focal neurological deficits. Extremities: Symmetric 5 x 5 power.Multiple bilateral lower extremity  wounds-details as per daily PT note. Chronic and patchy mild bilateral lower leg faint erythema-stable. Skin: Extensive patchy redness of back and back of bilateral arms suggestive of fungal infection- seem much better.  Data Reviewed: Basic Metabolic Panel:  Recent Labs Lab 10/06/14 0500 10/07/14 0615 10/09/14 0605 10/12/14 0510  NA 133* 135* 135* 139  K 3.5* 3.7 3.8 3.5*  CL 92* 94* 93* 93*  CO2 30 28 30  35*  GLUCOSE 170* 146* 136* 98  BUN 99* 101* 97* 90*  CREATININE 2.47* 2.47* 2.26* 2.00*  CALCIUM 7.4* 7.4* 7.4* 8.0*  MG 1.9  --   --   --   PHOS 4.6 4.6  --   --    Liver Function Tests:  Recent Labs Lab 10/06/14 0500 10/07/14 0615 10/12/14 0510  AST  --   --  32  ALT  --   --  17  ALKPHOS  --   --  149*  BILITOT  --   --  1.1  PROT  --   --  6.2  ALBUMIN 1.4* 1.3* 1.4*   No results found for this basename: LIPASE, AMYLASE,  in the last 168 hours No results found for this basename: AMMONIA,  in the last 168 hours CBC:  Recent Labs Lab 10/07/14 0615 10/09/14 0605 10/12/14 0510  WBC 8.1 8.2 10.8*  HGB 8.8* 8.7* 8.9*  HCT 27.5* 27.4* 28.0*  MCV 98.2 98.9 100.4*  PLT 300 316 295   Cardiac Enzymes: No results found for this basename: CKTOTAL, CKMB, CKMBINDEX, TROPONINI,  in the last 168 hours BNP (last 3 results)  Recent Labs  09/30/14 0520  PROBNP 8120.0*   CBG:  Recent Labs Lab 10/11/14 1101  10/11/14 1628 10/11/14 2124 10/12/14 0613 10/12/14 1112  GLUCAP 125* 117* 155* 107* 105*    No results found for this or any previous visit (from the past 240 hour(s)).      Studies: Dg Abd Portable 1v  10/11/2014   CLINICAL DATA:  64 year old female with constipation. Initial encounter.  EXAM: PORTABLE ABDOMEN - 1 VIEW  COMPARISON:  CT Abdomen and Pelvis 07/28/2014.  FINDINGS: Portable supine AP views of the abdomen and pelvis. Non obstructed bowel gas pattern. Mild motion artifact. Retained stool in the colon, specially the rectum. Retained stool  in the left colon appears decreased compared to July. Stable cholecystectomy clips. Stable visualized osseous structures. No definite pneumoperitoneum on these supine views.  IMPRESSION: Non obstructed bowel gas pattern but stool ball in the rectum raising the possibility of distal fecal impaction.   Electronically Signed   By: Lars Pinks M.D.   On: 10/11/2014 11:30        Scheduled Meds: . amiodarone  400 mg Oral Daily  . calcitRIOL  0.25 mcg Oral BID  . calcium-vitamin D  1 tablet Oral BID  . collagenase   Topical TID  . febuxostat  80 mg Oral Daily  . fluconazole  100 mg Oral Daily  .  HYDROmorphone (DILAUDID) injection  2 mg Intravenous Daily  . insulin aspart  0-15 Units Subcutaneous TID WC  . insulin aspart  0-5 Units Subcutaneous QHS  . insulin aspart  2 Units Subcutaneous TID WC  . insulin glargine  22 Units Subcutaneous QHS  . iron polysaccharides  150 mg Oral BID  . levothyroxine  125 mcg Oral QAC breakfast  . linagliptin  5 mg Oral Daily  . metolazone  2.5 mg Oral QODAY  . milk and molasses  1 enema Rectal Once  . multivitamin with minerals  1 tablet Oral Daily  . potassium chloride  20 mEq Oral BID  . saccharomyces boulardii  250 mg Oral BID  . senna  2 tablet Oral BID  . silver nitrate applicators  1 application Topical Once  . sodium chloride  3 mL Intravenous Q12H  . torsemide  60 mg Oral BID  . 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    Vernell Leep, MD, FACP, FHM. [facility] Pager [phone]  If 7PM-7AM, please contact night-coverage [email or web address] Password [email or web address] 10/12/2014, 3:08 PM    LOS: 33 days

## 2014-10-12 NOTE — Progress Notes (Signed)
Patient refused dressing change. I informed patient that dressing changes were ordered 3 times daily. Patient stated that she gets hydrotherapy and she will not have any additional dressing changes [name] 10/11/12

## 2014-10-12 NOTE — Progress Notes (Signed)
Pt does not want dressing change until after hydrotherapy, not TID. Also refused nurse stick for new iv access, will only allow IV team to try. Sherrie Mustache 2:47 PM

## 2014-10-13 DIAGNOSIS — Z515 Encounter for palliative care: Secondary | ICD-10-CM

## 2014-10-13 DIAGNOSIS — R531 Weakness: Secondary | ICD-10-CM

## 2014-10-13 DIAGNOSIS — Z7189 Other specified counseling: Secondary | ICD-10-CM

## 2014-10-13 LAB — URINALYSIS, ROUTINE W REFLEX MICROSCOPIC
Bilirubin Urine: NEGATIVE
GLUCOSE, UA: NEGATIVE mg/dL
Ketones, ur: NEGATIVE mg/dL
Nitrite: POSITIVE — AB
PH: 5 (ref 5.0–8.0)
Protein, ur: NEGATIVE mg/dL
SPECIFIC GRAVITY, URINE: 1.011 (ref 1.005–1.030)
Urobilinogen, UA: 1 mg/dL (ref 0.0–1.0)

## 2014-10-13 LAB — BASIC METABOLIC PANEL
Anion gap: 10 (ref 5–15)
BUN: 92 mg/dL — AB (ref 6–23)
CO2: 34 mEq/L — ABNORMAL HIGH (ref 19–32)
CREATININE: 2.09 mg/dL — AB (ref 0.50–1.10)
Calcium: 7.9 mg/dL — ABNORMAL LOW (ref 8.4–10.5)
Chloride: 92 mEq/L — ABNORMAL LOW (ref 96–112)
GFR calc Af Amer: 27 mL/min — ABNORMAL LOW (ref >90)
GFR, EST NON AFRICAN AMERICAN: 24 mL/min — AB (ref >90)
GLUCOSE: 137 mg/dL — AB (ref 70–99)
Potassium: 3.7 mEq/L (ref 3.7–5.3)
Sodium: 136 mEq/L — ABNORMAL LOW (ref 137–147)

## 2014-10-13 LAB — GLUCOSE, CAPILLARY
GLUCOSE-CAPILLARY: 201 mg/dL — AB (ref 70–99)
Glucose-Capillary: 140 mg/dL — ABNORMAL HIGH (ref 70–99)
Glucose-Capillary: 153 mg/dL — ABNORMAL HIGH (ref 70–99)
Glucose-Capillary: 148 mg/dL — ABNORMAL HIGH (ref 70–99)

## 2014-10-13 LAB — URINE MICROSCOPIC-ADD ON

## 2014-10-13 LAB — PROTIME-INR
INR: 3.11 — ABNORMAL HIGH (ref 0.00–1.49)
Prothrombin Time: 32 seconds — ABNORMAL HIGH (ref 11.6–15.2)

## 2014-10-13 MED ORDER — DEXTROSE 5 % IV SOLN
1.0000 g | INTRAVENOUS | Status: AC
  Administered 2014-10-13 – 2014-10-19 (×7): 1 g via INTRAVENOUS
  Filled 2014-10-13 (×7): qty 10

## 2014-10-13 NOTE — Progress Notes (Signed)
Pt blood pressure 89/53 and pt sleepy but with no complaints. Alert and oriented. K.Schorr from [facility] paged and made aware of blood pressure and to please check resulted UA. Order for IV antibiotic q24 per K.Schorr. Will continue to assess and monitor.

## 2014-10-13 NOTE — Progress Notes (Signed)
Seen and agree  

## 2014-10-13 NOTE — Progress Notes (Signed)
PROGRESS NOTE    [name] TGG:[id number] DOB: January 17, 1950 DOA: 09/09/2014 PCP: Elby Showers, MD  HPI/Brief narrative 64 y.o. female with history of Morbid obesity, CKD 4, fibromyalgia, hypothyroidism, solitary kidney, hypothyroidism, diabetes, nonischemic cardiomyopathy with ejection fraction of 35%, atrial fibrillation who was admitted on 9/10 w/ recurrent Lower extremity cellulitis, immediately went into Septic shock- was in ICU on pressors until 9/14. Subsequently transferred to the Floor. Has completed antibiotics. Undergoing wound care/hydrotherapy per PT under [facility] supervision. Cardiology had signed off-family requested reconsult. Palliative care team consulted for goals of care. Discharged disposition pending-case management working on it.   Assessment/Plan:  1. Septic shock: Due to bilateral leg cellulitis. Required vasopressors in ICU. Resolved. 2. Multiple bilateral lower extremity wounds: Completed antibiotic course. Surgery & WOC followup appreciated. Continue hydrotherapy with PT. She will need OP follow up with plastic surgery. As per discussion with case management, if no SNF available to manage wound care, may be eligible for LTAC. Significant pain during wound care- much better controlled with Dilaudid 2 mg IV x1 prior to dressing changes. Management per surgery. 3. Acute on chronic combined systolic and diastolic CHF/anasarca: Difficult to assess volume status despite large volume diuresis. Weights have been inaccurate and not regularly recorded. Cardiology switched her to oral diuretics-torsemide and metolazone. Marked hypoalbuminemia also contributing to anasarca. Swelling of bilateral legs probably from chronic venous stasis. Cardiology signed off. Patient continues to improve. Please call the [facility] office when she is ready for discharge and cardiology will arrange a f/u appt with Dr Sallyanne Kuster or his PA/NP. Please call if cardiac issues arise.Stable. Placed back on  telemetry on 10/12 due to concern for arrhythmias while on fluconazole. 4. A. Fib/s/p PPM: Controlled ventricular rate. Coumadin per pharmacy. INR 3.57. 5. Hypotension: resolved 6. 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 Continue Lantus, NovoLog mealtime and SSI. 7. Hypothyroid: Continue levothyroxine. Recent TSH 2 on 05/29/1511 8. History of gout: Stable  9. History of OHS/morbid obesity: Nightly CPAP 10. Tremors: Gabapentin discontinued. Seems to have resolved.  11. Acute on stage IV chronic kidney disease: No baseline creatinine probably in the low 2 range. Creatinine gradually improving. Follow BMP periodically. 12. RUE swelling: Dopplers negative for DVT. Likely from anasarca. 13. Anemia: Secondary to chronic kidney disease, acute illness and iron deficiency: Stable 14. Hypokalemia: Replaced 15. Fecal impaction/Constipation: Significant relief after digital disimpaction and enema 10/13. She wishes to continue Colace and senna but declines MiraLAX. Likely multifactorial from immobility, pain medications, poor diet and iron supplements. 16. Fungal rash (of back and back of arms): Seems extensive and less likely to improve from topical nystatin. Brief course of by mouth fluconazole-DC after 5 days. Aware of QTC 560 ms-discussed with primary cardiologist on 10/12 who suggested that QT is much longer with a paced beat and recommended going ahead with fluconazole and following daily EKGs and be concerned if QTC greater than 600. QTC 10/13 is 554-stable 17.  UTI: urine cultures ordered and started on rocephin.  18. Prolonged QTC: Please see discussion in #16. Try to keep potassium greater than >4 and magnesium >2  Code Status: Full Family Communication: Discussed with patient's sister [name] at bedside. Disposition Plan: LTAC vs SNF when bed available-approaching medical stability for discharge. Discussed at length with patient's sister [name] at bedside  on 10/13.  Consultants:  General surgery   Cardiology  Critical care medicine-signed off  Nephrology-signed off  [place] - pending-discussed with the palliative care  team and will try to arrange meeting today.  Procedures:  Central venous catheter insertion procedure on 09/10/2014 >Will attempt to try for a peripheral IV line, then DC central line.  Hydrotherapy   Antibiotics:  Completed all antibiotics 9/23   Subjective: Feels much better. No further abdominal or rectal pressure after significant BM following enema & decreased itching of back.  Objective: Filed Vitals:   10/12/14 1300 10/12/14 2143 10/13/14 0629 10/13/14 1049  BP: 110/53 92/50 105/57   Pulse: 76 109 101   Temp: 98.3 F (36.8 C) 98.1 F (36.7 C) 98.3 F (36.8 C) 98.4 F (36.9 C)  TempSrc: Oral Oral Oral Oral  Resp: 18 19 18    Height:      Weight:      SpO2: 90% 90% 92%     Intake/Output Summary (Last 24 hours) at 10/13/14 1255 Last data filed at 10/13/14 0634  Gross per 24 hour  Intake      0 ml  Output   3000 ml  Net  -3000 ml   Filed Weights   10/06/14 2300 10/08/14 0423  Weight: 138.801 kg (306 lb) 136.533 kg (301 lb)     Exam:  General exam: Moderately built and morbidly obese female lying comfortably propped up in bed. Appears pleasant and cheerful this morning. Respiratory system: Clear. No increased work of breathing. Cardiovascular system: S1 & S2 heard, RRR. No JVD, murmurs, gallops, clicks. 2+ pitting bilateral leg edema/anasarca-leg edema appears chronic. Telemetry: A. fib with ventricular rate in the 90s. Gastrointestinal system: Abdomen is nondistended, soft and nontender. Normal bowel sounds heard. Central nervous system: Alert and oriented. No focal neurological deficits. Extremities: Symmetric 5 x 5 power.Multiple bilateral lower extremity wounds-details as per daily PT note. Chronic and patchy mild bilateral lower leg faint erythema-stable. Skin:  Extensive patchy redness of back and back of bilateral arms suggestive of fungal infection- seem much better.  Data Reviewed: Basic Metabolic Panel:  Recent Labs Lab 10/07/14 0615 10/09/14 0605 10/12/14 0510 10/12/14 1640 10/13/14 0500  NA 135* 135* 139  --  136*  K 3.7 3.8 3.5*  --  3.7  CL 94* 93* 93*  --  92*  CO2 28 30 35*  --  34*  GLUCOSE 146* 136* 98  --  137*  BUN 101* 97* 90*  --  92*  CREATININE 2.47* 2.26* 2.00*  --  2.09*  CALCIUM 7.4* 7.4* 8.0*  --  7.9*  MG  --   --   --  1.8  --   PHOS 4.6  --   --   --   --    Liver Function Tests:  Recent Labs Lab 10/07/14 0615 10/12/14 0510  AST  --  32  ALT  --  17  ALKPHOS  --  149*  BILITOT  --  1.1  PROT  --  6.2  ALBUMIN 1.3* 1.4*   No results found for this basename: LIPASE, AMYLASE,  in the last 168 hours No results found for this basename: AMMONIA,  in the last 168 hours CBC:  Recent Labs Lab 10/07/14 0615 10/09/14 0605 10/12/14 0510  WBC 8.1 8.2 10.8*  HGB 8.8* 8.7* 8.9*  HCT 27.5* 27.4* 28.0*  MCV 98.2 98.9 100.4*  PLT 300 316 295   Cardiac Enzymes: No results found for this basename: CKTOTAL, CKMB, CKMBINDEX, TROPONINI,  in the last 168 hours BNP (last 3 results)  Recent Labs  09/30/14 0520  PROBNP 8120.0*   CBG:  Recent Labs  Lab 10/12/14 1112 10/12/14 1633 10/12/14 2141 10/13/14 0627 10/13/14 1244  GLUCAP 105* 147* 141* 140* 153*    No results found for this or any previous visit (from the past 240 hour(s)).      Studies: No results found.      Scheduled Meds: . amiodarone  400 mg Oral Daily  . calcitRIOL  0.25 mcg Oral BID  . calcium-vitamin D  1 tablet Oral BID  . collagenase   Topical TID  . febuxostat  80 mg Oral Daily  . fluconazole  100 mg Oral Daily  .  HYDROmorphone (DILAUDID) injection  2 mg Intravenous Daily  . insulin aspart  0-15 Units Subcutaneous TID WC  . insulin aspart  0-5 Units Subcutaneous QHS  . insulin aspart  2 Units Subcutaneous TID WC    . insulin glargine  22 Units Subcutaneous QHS  . iron polysaccharides  150 mg Oral BID  . levothyroxine  125 mcg Oral QAC breakfast  . linagliptin  5 mg Oral Daily  . metolazone  2.5 mg Oral QODAY  . multivitamin with minerals  1 tablet Oral Daily  . potassium chloride  20 mEq Oral BID  . saccharomyces boulardii  250 mg Oral BID  . senna  2 tablet Oral BID  . silver nitrate applicators  1 application Topical Once  . sodium chloride  3 mL Intravenous Q12H  . torsemide  60 mg Oral BID  . 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    Hosie Poisson, MD, [facility] Pager [phone]  If 7PM-7AM, please contact night-coverage [email or web address] Password [email or web address] 10/13/2014, 12:55 PM    LOS: 34 days

## 2014-10-13 NOTE — Progress Notes (Signed)
ANTIBIOTIC CONSULT NOTE - INITIAL  Pharmacy Consult for rocephin Indication: UTI  Allergies  Allergen Reactions  . Ivp Dye [Iodinated Diagnostic Agents] Shortness Of Breath    Turn red, can't breathe  . Betadine [Povidone Iodine]   . Diphenhydramine Hcl Swelling    In hands and eyes  . Fish Allergy Nausea And Vomiting  . Iohexol      Desc: PT TURNS RED AND WHEEZING   . Penicillins Other (See Comments)    Resp arrest as child. Tolerates cephalosporins.  . Povidone-Iodine Other (See Comments)    Wheezing, turn red, can't breath, and blisters  . Promethazine Hcl Other (See Comments)    Low Blood Pressure  . Tape     Blisters, can use paper tape for short periods  . Clindamycin Hives and Rash    wheezing  . Iodine Rash  . Morphine Sulfate Nausea And Vomiting and Rash  . Primaxin [Imipenem] Rash    Patient Measurements: Height: 5' 6.93" (170 cm) Weight:  (unable to weigh pt d/t bed not zeroed) IBW/kg (Calculated) : 61.44  Vital Signs: Temp: 98.4 F (36.9 C) (10/14 1049) Temp Source: Oral (10/14 1049) BP: 105/57 mmHg (10/14 0629) Pulse Rate: 101 (10/14 0629) Intake/Output from previous day: 10/13 0701 - 10/14 0700 In: -  Out: 3000 [Urine:3000] Intake/Output from this shift:    Labs:  Recent Labs  10/12/14 0510 10/13/14 0500  WBC 10.8*  --   HGB 8.9*  --   PLT 295  --   CREATININE 2.00* 2.09*   Estimated Creatinine Clearance: 38.2 ml/min (by C-G formula based on Cr of 2.09). No results found for this basename: VANCOTROUGH, VANCOPEAK, VANCORANDOM, Ross, GENTPEAK, GENTRANDOM, TOBRATROUGH, TOBRAPEAK, TOBRARND, AMIKACINPEAK, AMIKACINTROU, AMIKACIN,  in the last 72 hours   Microbiology: Recent Results (from the past 720 hour(s))  CULTURE, BLOOD (ROUTINE X 2)     Status: None   Collection Time    09/15/14  1:21 PM      Result Value Ref Range Status   Specimen Description BLOOD RIGHT ANTECUBITAL   Final   Special Requests BOTTLES DRAWN AEROBIC ONLY 5CC    Final   Culture  Setup Time     Final   Value: 09/15/2014 18:00     Performed at [facility]   Culture     Final   Value: NO GROWTH 5 DAYS     Performed at [facility]   Report Status 09/21/2014 FINAL   Final  CULTURE, BLOOD (ROUTINE X 2)     Status: None   Collection Time    09/15/14  1:35 PM      Result Value Ref Range Status   Specimen Description BLOOD LEFT ANTECUBITAL   Final   Special Requests BOTTLES DRAWN AEROBIC AND ANAEROBIC 10CC   Final   Culture  Setup Time     Final   Value: 09/15/2014 18:02     Performed at [facility]   Culture     Final   Value: NO GROWTH 5 DAYS     Performed at [facility]   Report Status 09/21/2014 FINAL   Final  CLOSTRIDIUM DIFFICILE BY PCR     Status: None   Collection Time    09/20/14  9:04 AM      Result Value Ref Range Status   C difficile by pcr NEGATIVE  NEGATIVE Final    Medical History: Past Medical History  Diagnosis Date  . Personal history of other diseases  of circulatory system   . 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 Chronic lymphocytic thyroiditis   . Morbid obesity   . Other chronic pulmonary heart diseases   . Obstructive sleep apnea (adult) (pediatric)   . Other and unspecified hyperlipidemia   . Fibromyalgia   . Goiter   . Thyroiditis, autoimmune   . Hypothyroidism, acquired, autoimmune   . Solitary kidney, acquired   . Fatigue   . Hyperparathyroidism, secondary   . Vitamin D deficiency disease   . Hyperparathyroidism   . H/O varicose veins 03/27/12  . Type II or unspecified type diabetes mellitus without mention of complication, not stated as uncontrolled   . Diabetes mellitus type II   . Infections of kidney 03/27/12  . History of measles, mumps, or rubella 03/27/12  . Tubal pregnancy 12/23/76  . Unspecified essential hypertension     20 yrs ago  . Blood transfusion 1977    [facility]  . Complication of anesthesia 2010    hard to wake up after knee surgery   . Anemia   . Cardiomyopathy, nonischemic     EF 45 %  . Endometrial hyperplasia with atypia 05/05/12  . Diastolic HF (heart failure) 11/05/2012  . A-fib   . Echocardiogram abnormal 07/15/12    severe MR,mod to severe TR,mod to severe PA hypertension  . Pulmonary arterial hypertension   . PAF (paroxysmal atrial fibrillation), 1st episode since DCCV in Jan/14 03/19/2013  . Ulcers of both lower extremities, small, now with cellulitis 06/05/2013   Assessment: Pharmacy consulted to dose Rocephin for UTI.  10/11 urine with 85K Klebsiella pneumo sensitive to Rocephin.  Afebrile.  WBC 8.2>10.8.   Vanc 9/10 >> 9/14 LVQ 9/12 >> 9/13 Flagyl 9/10 >> 9/12 Cipro 9/11 >> 9/12 Ancef 9/14 >>9/23 Flucon 10/12>>  9/16 BCx - neg 9/10 bcx - GBS In 1/2 (sens to PCN) 9/11 urine - 85K K pneumo (sens to cefazolin) 9/16: Blood NEG 9/21 Cdiff NEG    Goal of Therapy:  Eradicate infection  Plan:  Rocephin 1 gm IV q24 No dosage adjustment needed Pharmacy will sign off.   Eudelia Bunch, Pharm.D. [phone] 10/13/2014 5:28 PM

## 2014-10-13 NOTE — Progress Notes (Signed)
Physical Therapy Wound Treatment Patient Details  Name: [name] MRN: [id number] Date of Birth: 06-23-1950  Today's Date: 10/13/2014 Time: 3818-2993 Time Calculation (min): 78 min  Subjective  Subjective: I've had a bad day.Marland KitchenMarland KitchenMarland KitchenI hate being so whiney Patient and Family Stated Goals: decr pain in wounds and heal without surgery  Pain Score: Pain Score: 9   Wound Assessment  Pressure Ulcer 09/10/14 Stage III -  Full thickness tissue loss. Subcutaneous fat may be visible but bone, tendon or muscle are NOT exposed. 3.5 X 2 (Active)  Dressing Type Gauze (Comment) 10/09/2014  3:30 AM  Dressing Changed 10/09/2014  3:30 AM  Dressing Change Frequency Every 5 days 09/26/2014 11:00 AM  State of Healing Early/partial granulation 09/26/2014 11:00 AM  Site / Wound Assessment Bleeding 09/29/2014  8:00 PM  % Wound base Red or Granulating 100% 09/29/2014  8:00 PM  % Wound base Yellow 75% 09/26/2014 11:00 AM  % Wound base Black 0% 09/26/2014 11:00 AM  % Wound base Other (Comment) 0% 09/26/2014 11:00 AM  Peri-wound Assessment Intact;Pink 09/26/2014 11:00 AM  Wound Length (cm) 3.5 cm 09/11/2014  8:00 AM  Wound Width (cm) 2 cm 09/11/2014  8:00 AM  Margins Unattached edges (unapproximated) 09/29/2014  8:00 PM  Drainage Amount Copious 10/07/2014  5:30 AM  Drainage Description Sanguineous 10/07/2014  5:30 AM  Treatment Cleansed;Other (Comment) 10/01/2014  9:30 AM     Pressure Ulcer 09/20/14 Deep Tissue Injury - Purple or maroon localized area of discolored intact skin or blood-filled blister due to damage of underlying soft tissue from pressure and/or shear. (Active)  Dressing Type Gauze (Comment) 10/13/2014 10:50 AM  Dressing Clean;Dry;Intact 10/13/2014 10:50 AM  Dressing Change Frequency Twice a day 10/01/2014  9:30 AM  State of Healing Non-healing 09/29/2014  8:04 PM  % Wound base Red or Granulating 90% 09/29/2014  8:04 PM  % Wound base Yellow 10% 09/29/2014  8:04 PM  Margins Unattached edges (unapproximated)  09/29/2014  8:04 PM  Drainage Amount Scant 09/29/2014  8:04 PM  Drainage Description Serosanguineous 09/29/2014  8:04 PM  Treatment Cleansed;Other (Comment) 09/29/2014  8:04 PM     Pressure Ulcer 09/20/14 Deep Tissue Injury - Purple or maroon localized area of discolored intact skin or blood-filled blister due to damage of underlying soft tissue from pressure and/or shear. (Active)  Dressing Type Gauze (Comment);ABD;Mesh briefs;Other (Comment) 10/13/2014  5:07 PM  Dressing Clean;Dry;Intact 10/13/2014  5:07 PM  Dressing Change Frequency Other (Comment) 10/13/2014  5:07 PM  State of Healing Eschar 10/13/2014  5:07 PM  Site / Wound Assessment Yellow;Painful;Granulation tissue 10/13/2014  5:07 PM  % Wound base Red or Granulating 5% 10/13/2014  5:07 PM  % Wound base Yellow 95% 10/13/2014  5:07 PM  % Wound base Black 0% 10/13/2014  5:07 PM  % Wound base Other (Comment) 0% 10/13/2014  5:07 PM  Peri-wound Assessment Pink;Intact 10/13/2014  5:07 PM  Wound Length (cm) 4.7 cm 10/07/2014 11:10 AM  Wound Width (cm) 6.5 cm 10/07/2014 11:10 AM  Wound Depth (cm) 0.4 cm 10/07/2014 11:10 AM  Margins Unattached edges (unapproximated) 10/13/2014  5:07 PM  Drainage Amount Moderate 10/13/2014  5:07 PM  Drainage Description Serosanguineous 10/13/2014  5:07 PM  Treatment Cleansed;Debridement (Selective);Hydrotherapy (Pulse lavage);Other (Comment);Packing (Dry gauze) 10/13/2014  5:07 PM     Wound 06/02/13 Abrasion(s) Leg Left (Active)  Dressing Type Gauze (Comment);ABD;Mesh briefs 10/13/2014  5:07 PM  Dressing Changed Changed 10/11/2014  6:00 PM  Dressing Status Clean;Dry;Intact 10/13/2014  5:07 PM  Dressing  Change Frequency Daily 10/13/2014  5:07 PM  Site / Wound Assessment Red 10/13/2014  5:07 PM  % Wound base Red or Granulating 100% 10/13/2014  5:07 PM  % Wound base Yellow 0% 10/13/2014  5:07 PM  % Wound base Black 0% 10/13/2014  5:07 PM  % Wound base Other (Comment) 0% 10/13/2014  5:07 PM  Peri-wound  Assessment Intact;Edema;Erythema (blanchable) 10/13/2014  5:07 PM  Margins Unattached edges (unapproximated) 10/13/2014  5:07 PM  Closure None 10/13/2014  5:07 PM  Drainage Amount Moderate 10/13/2014  5:07 PM  Drainage Description Serous 10/13/2014  5:07 PM  Non-staged Wound Description Full thickness 10/13/2014  5:07 PM  Treatment Cleansed 10/11/2014  6:00 PM     Wound / Incision (Open or Dehisced) 07/24/14 Incision - Open Leg Left (Active)     Wound / Incision (Open or Dehisced) 07/24/14 Incision - Open Thigh Right scabbed over circular area (Active)     Wound / Incision (Open or Dehisced) 07/29/14 Other (Comment) Thigh Left;Medial HYDRO (Active)     Wound / Incision (Open or Dehisced) 08/02/14 Other (Comment) Thigh Right;Medial Hydro, R medial thigh wound (Active)     Wound / Incision (Open or Dehisced) 09/10/14 Other (Comment) Thigh Medial;Left;Posterior;Proximal (Active)  Dressing Type Gauze (Comment);ABD;Mesh briefs;Other (Comment) 10/13/2014  5:07 PM  Dressing Changed Changed 10/11/2014  6:00 PM  Dressing Status Clean;Dry;Intact 10/13/2014  5:07 PM  Dressing Change Frequency Other (Comment) 10/09/2014 10:08 AM  Site / Wound Assessment Yellow;Painful;Pink;Bleeding 10/13/2014  5:07 PM  % Wound base Red or Granulating 0% 10/13/2014  5:07 PM  % Wound base Yellow 100% 10/13/2014  5:07 PM  % Wound base Black 0% 10/13/2014  5:07 PM  % Wound base Other (Comment) 0% 10/13/2014  5:07 PM  Peri-wound Assessment Edema;Other (Comment);Induration 10/13/2014  5:07 PM  Wound Length (cm) 7.9 cm 10/07/2014 11:10 AM  Wound Width (cm) 9 cm 10/07/2014 11:10 AM  Wound Depth (cm) 0.2 cm 10/07/2014 11:10 AM  Margins Unattached edges (unapproximated) 10/13/2014  5:07 PM  Closure None 10/13/2014  5:07 PM  Drainage Amount Copious 10/13/2014  5:07 PM  Drainage Description Odor;Serosanguineous 10/13/2014  5:07 PM  Non-staged Wound Description Full thickness 10/13/2014  5:07 PM  Treatment  Cleansed;Debridement (Selective);Hydrotherapy (Pulse lavage);Packing (Dry gauze);Other (Comment) 10/13/2014  5:07 PM     Wound / Incision (Open or Dehisced) 09/18/14 Other (Comment) Hip Right Black eschar tissue surrounded by pink tissue (Active)  Dressing Type Gauze (Comment);ABD;Mesh briefs 10/13/2014  5:07 PM  Dressing Changed Changed 10/11/2014  6:00 PM  Dressing Status Clean;Dry;Intact 10/13/2014  5:07 PM  Dressing Change Frequency Other (Comment) 10/09/2014 10:08 AM  Site / Wound Assessment Yellow;Black;Pink 10/13/2014  5:07 PM  % Wound base Red or Granulating 5% 10/13/2014  5:07 PM  % Wound base Yellow 95% 10/13/2014  5:07 PM  % Wound base Black 0% 10/13/2014  5:07 PM  % Wound base Other (Comment) 0% 10/09/2014 10:08 AM  Peri-wound Assessment Edema;Pink;Erythema (non-blanchable);Induration 10/13/2014  5:07 PM  Wound Length (cm) 8.4 cm 10/07/2014 11:10 AM  Wound Width (cm) 6.8 cm 10/07/2014 11:10 AM  Wound Depth (cm) 0.2 cm 10/07/2014 11:10 AM  Margins Unattached edges (unapproximated) 10/13/2014  5:07 PM  Closure None 10/13/2014  5:07 PM  Drainage Amount Moderate 10/13/2014  5:07 PM  Drainage Description Odor;Serosanguineous 10/13/2014  5:07 PM  Non-staged Wound Description Full thickness 10/13/2014  5:07 PM  Treatment Cleansed;Debridement (Selective);Hydrotherapy (Pulse lavage);Packing (Dry gauze);Other (Comment) 10/13/2014  5:07 PM     Incision 05/05/12 Perineum  Other (Comment) (Active)   Hydrotherapy Pulsed lavage therapy - wound location: Rt lateral hip, Lt lateral thigh, Lt proximal-posteriomedial thigh Pulsed Lavage with Suction (psi): 8 psi Pulsed Lavage with Suction - Normal Saline Used: 2000 mL Pulsed Lavage Tip: Tip with splash shield Selective Debridement Selective Debridement - Location: Rt lateral hip, Lt lateral thigh, Lt proximal-posteriomedial thigh Selective Debridement - Tools Used: Scissors;Scalpel;Forceps Selective Debridement - Tissue Removed: yellow/brown  eschar, yellow slough, fatty tissue   Wound Assessment and Plan  Wound Therapy - Assess/Plan/Recommendations Wound Therapy - Clinical Statement: Patient's wounds still mostly eschar, debridement is slow, due to low tolerance, but improving with increased dosages of dilaudid  Patient will continue to benefit from hydrotherapy to cleanse wounds and promote healing and this will be a long, steady process.   Wound Therapy - Functional Problem List: painful wounds limiting her mobility tolerance Factors Delaying/Impairing Wound Healing: Diabetes Mellitus;Infection - systemic/local;Immobility;Multiple medical problems;Vascular compromise Hydrotherapy Plan: Debridement;Dressing change;Patient/family education;Pulsatile lavage with suction Wound Therapy - Frequency: 6X / week Wound Therapy - Current Recommendations: WOC nurse Wound Therapy - Follow Up Recommendations: Other (comment) Wound Plan: see above  Wound Therapy Goals- Improve the function of patient's integumentary system by progressing the wound(s) through the phases of wound healing (inflammation - proliferation - remodeling) by: Decrease Necrotic Tissue to: <75% Decrease Necrotic Tissue - Progress: Progressing toward goal Increase Granulation Tissue to: >25% Increase Granulation Tissue - Progress: Progressing toward goal Improve Drainage Characteristics: Min Improve Drainage Characteristics - Progress: Not progressing Goals/treatment plan/discharge plan were made with and agreed upon by patient/family: Yes Time For Goal Achievement: 7 days Wound Therapy - Potential for Goals: Good  Goals will be updated until maximal potential achieved or discharge criteria met.  Discharge criteria: when goals achieved, discharge from hospital, MD decision/surgical intervention, no progress towards goals, refusal/missing three consecutive treatments without notification or medical reason.  GP     Klyn Kroening, Tessie Fass 10/13/2014, 5:17  PM 10/13/2014  Donnella Sham, [place] [phone]  (pager)

## 2014-10-13 NOTE — Progress Notes (Signed)
During shift report, I was told pt was to have her dsg changes 8 hrs after her hydrotherapy session, which would be 2300, and that the pt knows the schedule. When I went to the pt's room about 2200 to ask if she would be ready, pt refused.

## 2014-10-13 NOTE — Clinical Social Work Note (Signed)
CSW continues to follow for support and dc planning as appropriate- plans still in being determined.  Eduard Clos, MSW, [place]

## 2014-10-13 NOTE — Progress Notes (Signed)
10/13/14 1300  Clinical Encounter Type  Visited With Patient and family together;Health care provider  Visit Type Initial;Other (Comment) (Palliative Care GOC)  Spiritual Encounters  Spiritual Needs Emotional  Stress Factors  Patient Stress Factors Health changes;Major life changes  Family Stress Factors Health changes;Major life changes   Chaplain attended the patient's goals of care meeting with a member of the palliative care team. Patient's husband and sister were both present for the meeting. Patient's husband and sister are both really supportive and uplifted her choices throughout the meeting. Patient did express some fear and uneasiness in making choices related to end of life. Patient also expressed some sadness, fear, and anxiety related to her past experience at a local nursing/rehab facility. Patient and patient's family are hopeful for the future. Chaplain is going to bring patient and patient's family an advanced directive to fill out when able. Kenslei Hearty, Claudius Sis, Chaplain 1:59 PM

## 2014-10-13 NOTE — Progress Notes (Signed)
Pt continues to have nausea, no appetite, chills, and her urine is cloudy. Above discussed with Dr. Karleen Hampshire. Order received to change foley tubing and bag and obtain UA.

## 2014-10-13 NOTE — Progress Notes (Signed)
NUTRITION FOLLOW UP  INTERVENTION:  Continue snacks TID  RD to follow for nutrition care plan  NUTRITION DIAGNOSIS: Increased nutrient needs related to wound healing as evidenced by estimated nutrition needs, ongoing  Goal: Pt to meet >/= 90% of their estimated nutrition needs, progressing  Monitor:  PO intake, weight, labs, I/O's  ASSESSMENT: 64 y.o. Female with history of fibromyalgia, hypothyroidism, solitary kidney, hypothyroidism, diabetes, nonischemic cardiomyopathy with ejection fraction of 35%, atrial fibrillation who presented to ED with left-sided lower leg pain.  Patient not feeling well upon RD visit with blanket over head.  PO intake continues to be variable at 0-100% per flowsheet records.  Increased nutrient needs ongoing given wound presence.  Snacks ordered TID.  Goals of care meeting with Palliative Care Team scheduled for today.  Height: Ht Readings from Last 1 Encounters:  09/09/14 5' 6.93" (1.7 m)    Weight: Wt Readings from Last 1 Encounters:  10/08/14 301 lb (136.533 kg)    BMI:  Body mass index is 47.24 kg/(m^2).  Estimated Nutritional Needs: Kcal: 1700-1900 Protein: 100-110 gm Fluid: 1.7-1.9 L  Skin:  Left thigh unstageable wound Left posterior thigh unstageable wound Right hip unstageable wound Left outer calf with partial thickness stasis ulcers Stage II left buttock wound  Diet Order: Heart Healthy/Carbohydrate Modified    Intake/Output Summary (Last 24 hours) at 10/13/14 1154 Last data filed at 10/13/14 0634  Gross per 24 hour  Intake      0 ml  Output   3000 ml  Net  -3000 ml   Labs:   Recent Labs Lab 10/07/14 0615 10/09/14 0605 10/12/14 0510 10/12/14 1640 10/13/14 0500  NA 135* 135* 139  --  136*  K 3.7 3.8 3.5*  --  3.7  CL 94* 93* 93*  --  92*  CO2 28 30 35*  --  34*  BUN 101* 97* 90*  --  92*  CREATININE 2.47* 2.26* 2.00*  --  2.09*  CALCIUM 7.4* 7.4* 8.0*  --  7.9*  MG  --   --   --  1.8  --   PHOS 4.6  --    --   --   --   GLUCOSE 146* 136* 98  --  137*    CBG (last 3)   Recent Labs  10/12/14 1633 10/12/14 2141 10/13/14 0627  GLUCAP 147* 141* 140*    Scheduled Meds: . amiodarone  400 mg Oral Daily  . calcitRIOL  0.25 mcg Oral BID  . calcium-vitamin D  1 tablet Oral BID  . collagenase   Topical TID  . febuxostat  80 mg Oral Daily  . fluconazole  100 mg Oral Daily  .  HYDROmorphone (DILAUDID) injection  2 mg Intravenous Daily  . insulin aspart  0-15 Units Subcutaneous TID WC  . insulin aspart  0-5 Units Subcutaneous QHS  . insulin aspart  2 Units Subcutaneous TID WC  . insulin glargine  22 Units Subcutaneous QHS  . iron polysaccharides  150 mg Oral BID  . levothyroxine  125 mcg Oral QAC breakfast  . linagliptin  5 mg Oral Daily  . metolazone  2.5 mg Oral QODAY  . multivitamin with minerals  1 tablet Oral Daily  . potassium chloride  20 mEq Oral BID  . saccharomyces boulardii  250 mg Oral BID  . senna  2 tablet Oral BID  . silver nitrate applicators  1 application Topical Once  . sodium chloride  3 mL Intravenous Q12H  .  torsemide  60 mg Oral BID  . Warfarin - Pharmacist Dosing Inpatient   Does not apply q1800    Continuous Infusions:   Past Medical History  Diagnosis Date  . Personal history of other diseases of circulatory system   . 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 Chronic lymphocytic thyroiditis   . Morbid obesity   . Other chronic pulmonary heart diseases   . Obstructive sleep apnea (adult) (pediatric)   . Other and unspecified hyperlipidemia   . Fibromyalgia   . Goiter   . Thyroiditis, autoimmune   . Hypothyroidism, acquired, autoimmune   . Solitary kidney, acquired   . Fatigue   . Hyperparathyroidism, secondary   . Vitamin D deficiency disease   . Hyperparathyroidism   . H/O varicose veins 03/27/12  . Type II or unspecified type diabetes mellitus without mention of complication, not stated as uncontrolled   . Diabetes mellitus type  II   . Infections of kidney 03/27/12  . History of measles, mumps, or rubella 03/27/12  . Tubal pregnancy 12/23/76  . Unspecified essential hypertension     20 yrs ago  . Blood transfusion 1977    [facility]  . Complication of anesthesia 2010    hard to wake up after knee surgery  . Anemia   . Cardiomyopathy, nonischemic     EF 45 %  . Endometrial hyperplasia with atypia 05/05/12  . Diastolic HF (heart failure) 11/05/2012  . A-fib   . Echocardiogram abnormal 07/15/12    severe MR,mod to severe TR,mod to severe PA hypertension  . Pulmonary arterial hypertension   . PAF (paroxysmal atrial fibrillation), 1st episode since DCCV in Jan/14 03/19/2013  . Ulcers of both lower extremities, small, now with cellulitis 06/05/2013    Past Surgical History  Procedure Laterality Date  . Cholecystectomy    . Pacemaker placement  02/26/11    Medtronic Adapta  . Knee surgery    . Resection duplicate left kidney    . 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 Hernia repair  1999    ruptured ;  . D& c with hysteroscopy & cervical polypectomy  05/05/12  . Cardioversion N/A 02/06/2013    Successful  . Iud removal  01/30/13  . Ectopic pregnancy surgery  1978  . Cardioversion N/A 06/08/2013    Procedure: CARDIOVERSION;  Surgeon: Sanda Klein, MD;  Location: [place];  Service: Cardiovascular;  Laterality: N/A;  Room [phone]  . Cardioversion N/A 02/01/2014    Procedure: CARDIOVERSION;  Surgeon: Sanda Klein, MD;  Location: [place];  Service: Cardiovascular;  Laterality: N/A;  . Cardioversion N/A 06/02/2014    Procedure: CARDIOVERSION;  Surgeon: Sanda Klein, MD;  Location: [place];  Service: Cardiovascular;  Laterality: N/A;    Arthur Holms, RD, LDN Pager #: [phone] After-Hours Pager #: [phone]

## 2014-10-13 NOTE — Progress Notes (Signed)
Pt is not feeling well this morning. She feels nauseated, but can not pinpoint any other specific complaint. She is tremorous with intention and feels cold. T- 98.4. She has not eaten and feels that if she takes her meds she will vomit. MD aware.

## 2014-10-13 NOTE — Progress Notes (Signed)
ANTICOAGULATION CONSULT NOTE - Follow Up Consult  Pharmacy Consult for Coumadin Indication: atrial fibrillation  Patient Measurements: Height: 5' 6.93" (170 cm) Weight:  (unable to weigh pt d/t bed not zeroed) IBW/kg (Calculated) : 61.44  Labs:  Recent Labs  10/11/14 0636 10/12/14 0510 10/13/14 0500  HGB  --  8.9*  --   HCT  --  28.0*  --   PLT  --  295  --   LABPROT 31.9* 35.7* 32.0*  INR 3.09* 3.57* 3.11*  CREATININE  --  2.00* 2.09*    Estimated Creatinine Clearance: 38.2 ml/min (by C-G formula based on Cr of 2.09).  Assessment:   INR remains supratherapeutic.  Increased from 3.09->3.57 after 1 mg dose on 10/11/14, so held on 10/13. INR down to 3.11 today. Coumadin had been held x 6 days prior to that, with supratherapeutic INRs.     Home Coumadin dose was 2.5 mg on MWF and 5 mg on TTSS.  INR 3.4 on admit 09/09/14, then peaked at 5 after receiving Flagyl. Got Vitamin K 1 mg on 9/13.  On amiodarone as prior to admission.  Fluconazole added 10/12, so may effect INR over the next few days.  Goal of Therapy:  INR 2-3 Monitor platelets by anticoagulation protocol: Yes   Plan:   No Coumadin again today, given sensitivity to small dose on 10/11/14, and day # 3 Fluconazole.  Hope to be able to resume Coumadin tomorrow.  Continue daily PT/INR.  Arty Baumgartner, [place] Pager: [phone] 10/13/2014,11:13 AM

## 2014-10-13 NOTE — Progress Notes (Addendum)
Physical Therapy Treatment Patient Details Name: [name] MRN: [id number] DOB: 06-16-1950 Today's Date: 10/13/2014    History of Present Illness 64 y.o. female  with history of fibromyalgia, hypothyroidism, solitary kidney, hypothyroidism, diabetes, nonischemic cardiomyopathy with ejection fraction of 35%, atrial fibrillation who presented to the emergency department with left-sided lower leg pain. She was admitted on 9/10 w/ recurrent Lower extremity cellulitis. PCCM asked to see in consult on 9/11 for persistent hypotension and worsening renal failure.  She had a previous admission for LE cellulitis in 07/2014 followed by [facility].  Pt had returned home with HHPT prior to this admission.     PT Comments    Pt admitted with above. Pt currently with functional limitations due to strength and endurance deficits.  Pt too painful to perform EOB therefore performed UE exercises only. Pt will benefit from skilled PT to increase their independence and safety with mobility to allow discharge to the venue listed below.    Follow Up Recommendations  LTACH;SNF;Supervision/Assistance - 24 hour     Equipment Recommendations  None recommended by PT    Recommendations for Other Services       Precautions / Restrictions Precautions Precautions: Fall Restrictions Weight Bearing Restrictions: No    Mobility  Bed Mobility   Bed Mobility: Rolling Rolling: Min guard         General bed mobility comments: Used rail to roll upper body and able to perform by herself.   Transfers                    Ambulation/Gait                 Stairs            Wheelchair Mobility    Modified Rankin (Stroke Patients Only)       Balance                                    Cognition Arousal/Alertness: Awake/alert Behavior During Therapy: Anxious Overall Cognitive Status: Within Functional Limits for tasks assessed                      Exercises  General Exercises - Upper Extremity Shoulder Flexion: AROM;Strengthening;Both;10 reps;Seated;Bar weights/barbell;Theraband Theraband Level (Shoulder Flexion): Level 1 (Yellow);Level 2 (Red) Bar Weights/Barbell (Shoulder Flexion): 3 lbs Shoulder ABduction: AROM;Both;10 reps;Strengthening;Seated;Theraband Theraband Level (Shoulder Abduction): Level 2 (Red);Level 1 (Yellow) Bar Weights/Barbell (Shoulder Abduction): 1 lb Shoulder Horizontal ABduction: AROM;Strengthening;Both;10 reps;Seated;Theraband Theraband Level (Shoulder Horizontal Abduction): Level 1 (Yellow);Level 2 (Red) Bar Weights/Barbell (Shoulder Horizontal Abduction): 3 lbs Elbow Flexion: AROM;Strengthening;Both;10 reps;Seated;Bar weights/barbell;Theraband Theraband Level (Elbow Flexion): Level 1 (Yellow);Level 2 (Red) Bar Weights/Barbell (Elbow Flexion): 3 lbs    General Comments        Pertinent Vitals/Pain Pain Assessment: 0-10 Pain Score: 10-Worst pain ever Pain Location: B LEs Pain Descriptors / Indicators: Aching;Sore Pain Intervention(s): Limited activity within patient's tolerance;Monitored during session;Repositioned VSS    Home Living                      Prior Function            PT Goals (current goals can now be found in the care plan section) Progress towards PT goals: Goals revised.  1/4 met.   New goals set to be met 10/27/14.    Frequency  Min 2X/week    PT  Plan Current plan remains appropriate    Co-evaluation             End of Session Equipment Utilized During Treatment: Gait belt Activity Tolerance: Patient limited by fatigue;Patient limited by pain Patient left: in bed;with call bell/phone within reach     Time: 1135-1200 PT Time Calculation (min): 25 min  Charges:  $Therapeutic Exercise: 23-37 mins                    G CodesDenice Paradise 2014-11-08, 2:09 PM Peggie Hornak,PT Acute Rehabilitation [phone] (pager)

## 2014-10-13 NOTE — Progress Notes (Signed)
Patient refuses to wear CPAP.

## 2014-10-14 DIAGNOSIS — E876 Hypokalemia: Secondary | ICD-10-CM

## 2014-10-14 DIAGNOSIS — R531 Weakness: Secondary | ICD-10-CM

## 2014-10-14 DIAGNOSIS — Z7189 Other specified counseling: Secondary | ICD-10-CM

## 2014-10-14 DIAGNOSIS — R600 Localized edema: Secondary | ICD-10-CM

## 2014-10-14 DIAGNOSIS — Z515 Encounter for palliative care: Secondary | ICD-10-CM

## 2014-10-14 LAB — BASIC METABOLIC PANEL
Anion gap: 12 (ref 5–15)
BUN: 93 mg/dL — AB (ref 6–23)
CO2: 33 mEq/L — ABNORMAL HIGH (ref 19–32)
CREATININE: 2.27 mg/dL — AB (ref 0.50–1.10)
Calcium: 7.8 mg/dL — ABNORMAL LOW (ref 8.4–10.5)
Chloride: 90 mEq/L — ABNORMAL LOW (ref 96–112)
GFR calc non Af Amer: 21 mL/min — ABNORMAL LOW (ref >90)
GFR, EST AFRICAN AMERICAN: 25 mL/min — AB (ref >90)
Glucose, Bld: 138 mg/dL — ABNORMAL HIGH (ref 70–99)
POTASSIUM: 3.6 meq/L — AB (ref 3.7–5.3)
Sodium: 135 mEq/L — ABNORMAL LOW (ref 137–147)

## 2014-10-14 LAB — GLUCOSE, CAPILLARY
GLUCOSE-CAPILLARY: 160 mg/dL — AB (ref 70–99)
GLUCOSE-CAPILLARY: 128 mg/dL — AB (ref 70–99)
Glucose-Capillary: 175 mg/dL — ABNORMAL HIGH (ref 70–99)
Glucose-Capillary: 121 mg/dL — ABNORMAL HIGH (ref 70–99)

## 2014-10-14 LAB — MAGNESIUM: Magnesium: 1.8 mg/dL (ref 1.5–2.5)

## 2014-10-14 LAB — PROTIME-INR
INR: 2.62 — ABNORMAL HIGH (ref 0.00–1.49)
Prothrombin Time: 28.2 seconds — ABNORMAL HIGH (ref 11.6–15.2)

## 2014-10-14 MED ORDER — POTASSIUM CHLORIDE CRYS ER 20 MEQ PO TBCR
40.0000 meq | EXTENDED_RELEASE_TABLET | Freq: Two times a day (BID) | ORAL | Status: AC
  Administered 2014-10-14 (×2): 40 meq via ORAL
  Filled 2014-10-14: qty 2

## 2014-10-14 MED ORDER — WARFARIN SODIUM 1 MG PO TABS
1.0000 mg | ORAL_TABLET | Freq: Once | ORAL | Status: AC
  Administered 2014-10-14: 1 mg via ORAL
  Filled 2014-10-14: qty 1

## 2014-10-14 MED ORDER — MAGNESIUM SULFATE IN D5W 10-5 MG/ML-% IV SOLN
1.0000 g | Freq: Once | INTRAVENOUS | Status: AC
  Administered 2014-10-14: 1 g via INTRAVENOUS
  Filled 2014-10-14 (×2): qty 100

## 2014-10-14 NOTE — Consult Note (Signed)
Patient WI:[id number] [name]      DOB: July 22, 1950      DJM:[id number]     Consult Note from the Palliative Medicine Team at [facility] Requested by: Dr Karleen Hampshire     PCP: Elby Showers, MD Reason for Consultation:[place] [facility] [place] and options     Phone [phone]  Assessment of patients Current state:  64 y.o. female with history of Morbid obesity, CKD 4, fibromyalgia, hypothyroidism, solitary kidney, hypothyroidism, diabetes, nonischemic cardiomyopathy with ejection fraction of 35%, atrial fibrillation who was admitted on 9/10 w/ recurrent Lower extremity cellulitis, immediately went into Septic shock- was in ICU on pressors until 9/14. Subsequently transferred to the Floor. Has completed antibiotics. Continues with wound care/hydrotherapy per PT  Slow progression  Overall improvement for long term prognsis for  retrun to baseline tenuous  Faced with advanced directive decisions and anticipatory care needs  Consult is for review of medical treatment options, clarification of goals of care and end of life issues, disposition and options, and symptom recommendation.  This NP Wadie Lessen reviewed medical records, received report from team, assessed the patient and then meet at the patient's bedside along with her sister Kieth Brightly and her husband Chrissie Noa  to discuss diagnosis prognosis, [place], EOL wishes disposition and options.  A detailed discussion was had today regarding advanced directives.  Concepts specific to code status, artifical feeding and hydration, continued IV antibiotics and rehospitalization was had.  The difference between a aggressive medical intervention path  and a palliative comfort care path for this patient at this time was had.  Values and goals of care important to patient and family were attempted to be elicited.  Concept of Hospice and Palliative Care were discussed.  Importance of continued conversation between patient and family to enhance patient  centered care.  Questions and concerns addressed.  Hard Choices booklet left for review. Family encouraged to call with questions or concerns.  PMT will continue to support holistically.   Goals of Care: 1.  Code Status: Partial  -desires chest compression and defibrillation only   2. Scope of Treatment:  Patient is open to all available and offered medical interventions to prolong life in hopes of goal of retrun to baseline. 1. Vital Signs: per unit 2. Respiratory/Oxygen:as needed to treat the treatable  3. Nutritional Support/Tube Feeds: no artificial feeding now or in the future 4. Antibiotics: yes 5. Blood Products:yes 6. IVF:yes 7. Labs:yes   3. Disposition:  Hopeful for LTAC, patient and family remain hopeful for improvement    4. Symptom Management:  -Weakness: continue PT/OT, discharge to SNF for rehabilitation, educated on initiation of self exercise program with AROM exercises demonstrated    5. Psychosocial:  Emotional support offered to patient and her family.  All express appreciation for opportunity to express thoughts and feelings associated with current difficult situation.  6. Spiritual:  Chaplin consulted, to assist with HPOA documentation   Patient Documents Completed or Given: Document Given Completed  Advanced Directives Pkt    MOST X   DNR    Gone from My Sight    Hard Choices X     Discussed importance of documentation of desired HPOA and advanced directives inorder to support patient centered care.   Brief HPI:  64 y.o. female with history of Morbid obesity, CKD 4, fibromyalgia, hypothyroidism, solitary kidney, hypothyroidism, diabetes, nonischemic cardiomyopathy with ejection fraction of 35%, atrial fibrillation who was admitted on 9/10 w/ recurrent Lower extremity cellulitis, immediately went into Septic  shock- was in ICU on pressors until 9/14. Subsequently transferred to the Floor. Has completed antibiotics. Undergoing wound  care/hydrotherapy per PT under CCS supervision.  Hopeful for discharge to LTAC   ROS: weakness, fatigue, poor appetite   PMH:  Past Medical History  Diagnosis Date  . Personal history of other diseases of circulatory system   . 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 Chronic lymphocytic thyroiditis   . Morbid obesity   . Other chronic pulmonary heart diseases   . Obstructive sleep apnea (adult) (pediatric)   . Other and unspecified hyperlipidemia   . Fibromyalgia   . Goiter   . Thyroiditis, autoimmune   . Hypothyroidism, acquired, autoimmune   . Solitary kidney, acquired   . Fatigue   . Hyperparathyroidism, secondary   . Vitamin D deficiency disease   . Hyperparathyroidism   . H/O varicose veins 03/27/12  . Type II or unspecified type diabetes mellitus without mention of complication, not stated as uncontrolled   . Diabetes mellitus type II   . Infections of kidney 03/27/12  . History of measles, mumps, or rubella 03/27/12  . Tubal pregnancy 12/23/76  . Unspecified essential hypertension     20 yrs ago  . Blood transfusion 1977    [facility]  . Complication of anesthesia 2010    hard to wake up after knee surgery  . Anemia   . Cardiomyopathy, nonischemic     EF 45 %  . Endometrial hyperplasia with atypia 05/05/12  . Diastolic HF (heart failure) 11/05/2012  . A-fib   . Echocardiogram abnormal 07/15/12    severe MR,mod to severe TR,mod to severe PA hypertension  . Pulmonary arterial hypertension   . PAF (paroxysmal atrial fibrillation), 1st episode since DCCV in Jan/14 03/19/2013  . Ulcers of both lower extremities, small, now with cellulitis 06/05/2013     PSH: Past Surgical History  Procedure Laterality Date  . Cholecystectomy    . Pacemaker placement  02/26/11    Medtronic Adapta  . Knee surgery    . Resection duplicate left kidney    . 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 Hernia repair  1999    ruptured ;  . D& c with hysteroscopy &  cervical polypectomy  05/05/12  . Cardioversion N/A 02/06/2013    Successful  . Iud removal  01/30/13  . Ectopic pregnancy surgery  1978  . Cardioversion N/A 06/08/2013    Procedure: CARDIOVERSION;  Surgeon: Sanda Klein, MD;  Location: [place];  Service: Cardiovascular;  Laterality: N/A;  Room [id number]  . Cardioversion N/A 02/01/2014    Procedure: CARDIOVERSION;  Surgeon: Sanda Klein, MD;  Location: [place];  Service: Cardiovascular;  Laterality: N/A;  . Cardioversion N/A 06/02/2014    Procedure: CARDIOVERSION;  Surgeon: Sanda Klein, MD;  Location: [facility];  Service: Cardiovascular;  Laterality: N/A;   I have reviewed the [place] and SH and  If appropriate update it with new information. Allergies  Allergen Reactions  . Ivp Dye [Iodinated Diagnostic Agents] Shortness Of Breath    Turn red, can't breathe  . Betadine [Povidone Iodine]   . Diphenhydramine Hcl Swelling    In hands and eyes  . Fish Allergy Nausea And Vomiting  . Iohexol      Desc: PT TURNS RED AND WHEEZING   . Penicillins Other (See Comments)    Resp arrest as child. Tolerates cephalosporins.  . Povidone-Iodine Other (See Comments)    Wheezing, turn  red, can't breath, and blisters  . Promethazine Hcl Other (See Comments)    Low Blood Pressure  . Tape     Blisters, can use paper tape for short periods  . Clindamycin Hives and Rash    wheezing  . Iodine Rash  . Morphine Sulfate Nausea And Vomiting and Rash  . Primaxin [Imipenem] Rash   Scheduled Meds: . amiodarone  400 mg Oral Daily  . calcitRIOL  0.25 mcg Oral BID  . calcium-vitamin D  1 tablet Oral BID  . cefTRIAXone (ROCEPHIN)  IV  1 g Intravenous Q24H  . collagenase   Topical TID  . febuxostat  80 mg Oral Daily  . fluconazole  100 mg Oral Daily  .  HYDROmorphone (DILAUDID) injection  2 mg Intravenous Daily  . insulin aspart  0-15 Units Subcutaneous TID WC  . insulin aspart  0-5 Units Subcutaneous QHS  . insulin aspart  2 Units Subcutaneous TID WC  .  insulin glargine  22 Units Subcutaneous QHS  . iron polysaccharides  150 mg Oral BID  . levothyroxine  125 mcg Oral QAC breakfast  . linagliptin  5 mg Oral Daily  . metolazone  2.5 mg Oral QODAY  . multivitamin with minerals  1 tablet Oral Daily  . potassium chloride  20 mEq Oral BID  . saccharomyces boulardii  250 mg Oral BID  . senna  2 tablet Oral BID  . silver nitrate applicators  1 application Topical Once  . sodium chloride  3 mL Intravenous Q12H  . torsemide  60 mg Oral BID  . Warfarin - Pharmacist Dosing Inpatient   Does not apply q1800   Continuous Infusions:  PRN Meds:.albuterol, ALPRAZolam, bisacodyl, calcium carbonate, docusate sodium, HYDROcodone-acetaminophen, HYDROmorphone (DILAUDID) injection, hydrOXYzine, nitroGLYCERIN, sodium chloride, sodium chloride    BP 91/53  Pulse 92  Temp(Src) 97.9 F (36.6 C) (Oral)  Resp 18  Ht 5' 6.93" (1.7 m)  Wt 136.533 kg (301 lb)  BMI 47.24 kg/m2  SpO2 91%   PPS:30 % at best   Intake/Output Summary (Last 24 hours) at 10/14/14 0828 Last data filed at 10/14/14 0430  Gross per 24 hour  Intake      0 ml  Output    301 ml  Net   -301 ml    Physical Exam:  General: chronically ill appearing, NAD HEENT:  moist buccal membranes, poor dentation CVS:   Tachycardic, rate 108 Chest; distant breath sounds, decreased in bases  Abdomen: obese, soft  Distant +BS Ext: BLEs with erythema and skin thickening Neuro: alert and oriented X3, engaged in today's conversation with family  Labs: CBC    Component Value Date/Time   WBC 10.8* 10/12/2014 0510   WBC 5.8 07/21/2009 1141   RBC 2.79* 10/12/2014 0510   RBC 2.34* 09/27/2014 0925   RBC 3.92 07/21/2009 1141   HGB 8.9* 10/12/2014 0510   HGB 11.9 07/21/2009 1141   HCT 28.0* 10/12/2014 0510   HCT 36.2 07/21/2009 1141   PLT 295 10/12/2014 0510   PLT 153 07/21/2009 1141   MCV 100.4* 10/12/2014 0510   MCV 92.4 07/21/2009 1141   MCH 31.9 10/12/2014 0510   MCH 30.3 07/21/2009 1141   MCHC  31.8 10/12/2014 0510   MCHC 32.8 07/21/2009 1141   RDW 19.6* 10/12/2014 0510   RDW 16.5* 07/21/2009 1141   LYMPHSABS 0.3* 09/13/2014 0342   LYMPHSABS 0.9 07/21/2009 1141   MONOABS 0.4 09/13/2014 0342   MONOABS 0.3 07/21/2009 1141   EOSABS 0.0  09/13/2014 0342   EOSABS 0.1 07/21/2009 1141   BASOSABS 0.0 09/13/2014 0342   BASOSABS 0.0 07/21/2009 1141    BMET    Component Value Date/Time   NA 136* 10/13/2014 0500   K 3.7 10/13/2014 0500   CL 92* 10/13/2014 0500   CO2 34* 10/13/2014 0500   GLUCOSE 137* 10/13/2014 0500   BUN 92* 10/13/2014 0500   CREATININE 2.09* 10/13/2014 0500   CREATININE 2.10* 05/28/2014 0001   CREATININE 1.28* 02/12/2011 1000   CALCIUM 7.9* 10/13/2014 0500   CALCIUM 9.5 03/24/2012 1212   GFRNONAA 24* 10/13/2014 0500   GFRAA 27* 10/13/2014 0500    CMP     Component Value Date/Time   NA 136* 10/13/2014 0500   K 3.7 10/13/2014 0500   CL 92* 10/13/2014 0500   CO2 34* 10/13/2014 0500   GLUCOSE 137* 10/13/2014 0500   BUN 92* 10/13/2014 0500   CREATININE 2.09* 10/13/2014 0500   CREATININE 2.10* 05/28/2014 0001   CREATININE 1.28* 02/12/2011 1000   CALCIUM 7.9* 10/13/2014 0500   CALCIUM 9.5 03/24/2012 1212   PROT 6.2 10/12/2014 0510   ALBUMIN 1.4* 10/12/2014 0510   AST 32 10/12/2014 0510   ALT 17 10/12/2014 0510   ALKPHOS 149* 10/12/2014 0510   BILITOT 1.1 10/12/2014 0510   GFRNONAA 24* 10/13/2014 0500   GFRAA 27* 10/13/2014 0500     Time In Time Out Total Time Spent with Patient Total Overall Time  1300 1430 80 min 90 min    Greater than 50%  of this time was spent counseling and coordinating care related to the above assessment and plan.   Wadie Lessen NP  Palliative Medicine Team Team Phone # [phone] Pager [phone]  Discussed with Dr Karleen Hampshire

## 2014-10-14 NOTE — Progress Notes (Signed)
Agree with above.   no indication for operative debridement. Continue bedside wound care.  Adin Hector

## 2014-10-14 NOTE — Progress Notes (Signed)
PROGRESS NOTE    [name] TKP:[id number] DOB: [birth year] DOA: 09/09/2014 PCP: Elby Showers, MD  HPI/Brief narrative 64 y.o. female with history of Morbid obesity, CKD 4, fibromyalgia, hypothyroidism, solitary kidney, hypothyroidism, diabetes, nonischemic cardiomyopathy with ejection fraction of 35%, atrial fibrillation who was admitted on 9/10 w/ recurrent Lower extremity cellulitis, immediately went into Septic shock- was in ICU on pressors until 9/14. Subsequently transferred to the Floor. Has completed antibiotics. Undergoing wound care/hydrotherapy per PT under CCS supervision. Cardiology had signed off-family requested reconsult. Palliative care team consulted for goals of care. Discharged disposition pending-case management working on it.   Assessment/Plan:  1. Septic shock: Due to bilateral leg cellulitis. Required vasopressors in ICU. Resolved. 2. Multiple bilateral lower extremity wounds: Completed antibiotic course. Surgery & WOC followup appreciated. Continue hydrotherapy with PT. She will need OP follow up with plastic surgery. As per discussion with case management, if no SNF available to manage wound care, may be eligible for LTAC. Significant pain during wound care- much better controlled with Dilaudid 2 mg IV x1 prior to dressing changes. Management per surgery. 3. Acute on chronic combined systolic and diastolic CHF/anasarca: Difficult to assess volume status despite large volume diuresis. Weights have been inaccurate and not regularly recorded. Cardiology switched her to oral diuretics-torsemide and metolazone. Marked hypoalbuminemia also contributing to anasarca. Swelling of bilateral legs probably from chronic venous stasis. Cardiology signed off. Patient continues to improve. Please call the [facility] office when she is ready for discharge and cardiology will arrange a f/u appt with Dr Sallyanne Kuster or his PA/NP. Please call if cardiac issues arise.Stable. Placed back on  telemetry on 10/12 due to concern for arrhythmias while on fluconazole. 4. A. Fib/s/p PPM: Controlled ventricular rate. Coumadin per pharmacy.  5. Hypotension: resolved 6. 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 Continue Lantus, NovoLog mealtime and SSI. 7. Hypothyroid: Continue levothyroxine. Recent TSH 2 on 05/29/1511 8. History of gout: Stable  9. History of OHS/morbid obesity: Nightly CPAP 10. Tremors: Gabapentin discontinued. Seems to have resolved.  11. Acute on stage IV chronic kidney disease: No baseline creatinine probably in the low 2 range. Creatinine gradually improving. Follow BMP periodically. 12. RUE swelling: Dopplers negative for DVT. Likely from anasarca. 13. Anemia: Secondary to chronic kidney disease, acute illness and iron deficiency: Stable 14. Hypokalemia: Replaced 15. Fecal impaction/Constipation: Significant relief after digital disimpaction and enema 10/13. She wishes to continue Colace and senna but declines MiraLAX. Likely multifactorial from immobility, pain medications, poor diet and iron supplements. 16. Fungal rash (of back and back of arms): Seems extensive and less likely to improve from topical nystatin. Brief course of by mouth fluconazole-DC after 5 days. Aware of QTC 560 ms-discussed with primary cardiologist on 10/12 who suggested that QT is much longer with a paced beat and recommended going ahead with fluconazole and following daily EKGs  17.  UTI: urine cultures ordered and started on rocephin.  18. Prolonged QTC: Please see discussion in #16. Try to keep potassium greater than >4 and magnesium >2  Code Status: Full Family Communication: Discussed with patient's sister [name] at bedside. Disposition Plan: LTAC vs SNF when bed available-approaching medical stability for discharge. Discussed at length with patient's sister [name] at bedside on 10/13.  Consultants:  General surgery   Cardiology  Critical  care medicine-signed off  Nephrology-signed off  [place] - pending-discussed with the palliative care team and will try to arrange meeting today.  Procedures:  Central  venous catheter insertion procedure on 09/10/2014 >Will attempt to try for a peripheral IV line, then DC central line.  Hydrotherapy   Antibiotics:  Completed all antibiotics 9/23   Subjective: FEEL LITTLE BETTER THAN YESTERDAY. Still frustrated about not being able to switch the insurances.   Objective: Filed Vitals:   10/13/14 2345 10/14/14 0527 10/14/14 0726 10/14/14 0736  BP: 97/63 94/61 97/48  91/53  Pulse:  107 76 92  Temp:  97.9 F (36.6 C)    TempSrc:  Oral    Resp:  18    Height:      Weight:      SpO2:  91%      Intake/Output Summary (Last 24 hours) at 10/14/14 1343 Last data filed at 10/14/14 0900  Gross per 24 hour  Intake    240 ml  Output    301 ml  Net    -61 ml   Filed Weights   10/06/14 2300 10/08/14 0423  Weight: 138.801 kg (306 lb) 136.533 kg (301 lb)     Exam:  General exam: Moderately built and morbidly obese female lying comfortably propped up in bed. Appears pleasant and cheerful this morning. Respiratory system: Clear. No increased work of breathing. Cardiovascular system: S1 & S2 heard, RRR. No JVD, murmurs, gallops, clicks. 2+ pitting bilateral leg edema/anasarca-leg edema appears chronic. Telemetry: A. fib with ventricular rate in the 90s. Gastrointestinal system: Abdomen is nondistended, soft and nontender. Normal bowel sounds heard. Central nervous system: Alert and oriented. No focal neurological deficits. Extremities: Symmetric 5 x 5 power.Multiple bilateral lower extremity wounds-details as per daily PT note. Chronic and patchy mild bilateral lower leg faint erythema-stable. Skin: Extensive patchy redness of back and back of bilateral arms suggestive of fungal infection- seem much better.  Data Reviewed: Basic Metabolic Panel:  Recent Labs Lab  10/09/14 0605 10/12/14 0510 10/12/14 1640 10/13/14 0500 10/14/14 1155  NA 135* 139  --  136* 135*  K 3.8 3.5*  --  3.7 3.6*  CL 93* 93*  --  92* 90*  CO2 30 35*  --  34* 33*  GLUCOSE 136* 98  --  137* 138*  BUN 97* 90*  --  92* 93*  CREATININE 2.26* 2.00*  --  2.09* 2.27*  CALCIUM 7.4* 8.0*  --  7.9* 7.8*  MG  --   --  1.8  --  1.8   Liver Function Tests:  Recent Labs Lab 10/12/14 0510  AST 32  ALT 17  ALKPHOS 149*  BILITOT 1.1  PROT 6.2  ALBUMIN 1.4*   No results found for this basename: LIPASE, AMYLASE,  in the last 168 hours No results found for this basename: AMMONIA,  in the last 168 hours CBC:  Recent Labs Lab 10/09/14 0605 10/12/14 0510  WBC 8.2 10.8*  HGB 8.7* 8.9*  HCT 27.4* 28.0*  MCV 98.9 100.4*  PLT 316 295   Cardiac Enzymes: No results found for this basename: CKTOTAL, CKMB, CKMBINDEX, TROPONINI,  in the last 168 hours BNP (last 3 results)  Recent Labs  09/30/14 0520  PROBNP 8120.0*   CBG:  Recent Labs Lab 10/13/14 1244 10/13/14 1707 10/13/14 2118 10/14/14 0602 10/14/14 1117  GLUCAP 153* 148* 201* 175* 160*    No results found for this or any previous visit (from the past 240 hour(s)).      Studies: No results found.      Scheduled Meds: . amiodarone  400 mg Oral Daily  . calcitRIOL  0.25 mcg Oral  BID  . calcium-vitamin D  1 tablet Oral BID  . cefTRIAXone (ROCEPHIN)  IV  1 g Intravenous Q24H  . collagenase   Topical TID  . febuxostat  80 mg Oral Daily  . fluconazole  100 mg Oral Daily  .  HYDROmorphone (DILAUDID) injection  2 mg Intravenous Daily  . insulin aspart  0-15 Units Subcutaneous TID WC  . insulin aspart  0-5 Units Subcutaneous QHS  . insulin aspart  2 Units Subcutaneous TID WC  . insulin glargine  22 Units Subcutaneous QHS  . iron polysaccharides  150 mg Oral BID  . levothyroxine  125 mcg Oral QAC breakfast  . linagliptin  5 mg Oral Daily  . metolazone  2.5 mg Oral QODAY  . multivitamin with minerals  1  tablet Oral Daily  . potassium chloride  20 mEq Oral BID  . saccharomyces boulardii  250 mg Oral BID  . senna  2 tablet Oral BID  . silver nitrate applicators  1 application Topical Once  . sodium chloride  3 mL Intravenous Q12H  . torsemide  60 mg Oral BID  . warfarin  1 mg Oral ONCE-1800  . 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    Hosie Poisson, MD, [facility] Pager [phone]  If 7PM-7AM, please contact night-coverage [email or web address] Password [email or web address] 10/14/2014, 1:43 PM    LOS: 35 days

## 2014-10-14 NOTE — Progress Notes (Signed)
Subjective: No real change.  Her 2 thigh wounds are still having hydrotherapy, and look about the same. Still has some odor.  She has marked lower leg edema with some cellulitis, she has a UTI also.  She is working to adjust her insurance to obtain treatment.    Objective: Vital signs in last 24 hours: Temp:  [97.6 F (36.4 C)-98.4 F (36.9 C)] 97.9 F (36.6 C) (10/15 0527) Pulse Rate:  [76-107] 92 (10/15 0736) Resp:  [18-19] 18 (10/15 0527) BP: (89-97)/(48-63) 91/53 mmHg (10/15 0736) SpO2:  [91 %-92 %] 91 % (10/15 0527) Last BM Date: 10/13/14  Intake/Output from previous day: 10/14 0701 - 10/15 0700 In: 0  Out: 301 [Urine:300; Stool:1] Intake/Output this shift:    General appearance: alert, cooperative, no distress and frustrated Extremities: edema both lower legs +3. She also has some lower extremity cellulitis.  Her thighs both have ulcers with necrotic tissue being debrided daily by Hydrotherapy with santyl.   Both have some odor.  Lab Results:   Recent Labs  10/12/14 0510  WBC 10.8*  HGB 8.9*  HCT 28.0*  PLT 295    BMET  Recent Labs  10/12/14 0510 10/13/14 0500  NA 139 136*  K 3.5* 3.7  CL 93* 92*  CO2 35* 34*  GLUCOSE 98 137*  BUN 90* 92*  CREATININE 2.00* 2.09*  CALCIUM 8.0* 7.9*   PT/INR  Recent Labs  10/13/14 0500 10/14/14 0415  LABPROT 32.0* 28.2*  INR 3.11* 2.62*     Recent Labs Lab 10/12/14 0510  AST 32  ALT 17  ALKPHOS 149*  BILITOT 1.1  PROT 6.2  ALBUMIN 1.4*     Lipase  No results found for this basename: lipase     Studies/Results: No results found.  Medications: . amiodarone  400 mg Oral Daily  . calcitRIOL  0.25 mcg Oral BID  . calcium-vitamin D  1 tablet Oral BID  . cefTRIAXone (ROCEPHIN)  IV  1 g Intravenous Q24H  . collagenase   Topical TID  . febuxostat  80 mg Oral Daily  . fluconazole  100 mg Oral Daily  .  HYDROmorphone (DILAUDID) injection  2 mg Intravenous Daily  . insulin aspart  0-15 Units  Subcutaneous TID WC  . insulin aspart  0-5 Units Subcutaneous QHS  . insulin aspart  2 Units Subcutaneous TID WC  . insulin glargine  22 Units Subcutaneous QHS  . iron polysaccharides  150 mg Oral BID  . levothyroxine  125 mcg Oral QAC breakfast  . linagliptin  5 mg Oral Daily  . metolazone  2.5 mg Oral QODAY  . multivitamin with minerals  1 tablet Oral Daily  . potassium chloride  20 mEq Oral BID  . saccharomyces boulardii  250 mg Oral BID  . senna  2 tablet Oral BID  . silver nitrate applicators  1 application Topical Once  . sodium chloride  3 mL Intravenous Q12H  . torsemide  60 mg Oral BID  . Warfarin - Pharmacist Dosing Inpatient   Does not apply q1800    Assessment/Plan 1. Multiple bilateral thigh and buttock/hip wounds  2. Multiple other medical problems 3.  Septic shock 4.  Acute on chronic systolic/diastolic CHF 5.  Anasarca 6.  Atrial fibrillation on coumadin  7.  Acute/chronic renal failure 8.  Prolonged QTC 9.  Body mass index is 47.24 kg/(m^2).  Plan:  Nothing new to add at this point.  Continue local care.     LOS: 35 days  Rayquon Uselman 10/14/2014

## 2014-10-14 NOTE — Progress Notes (Signed)
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 Subcutaneous fat may be visible but bone, tendon or muscle are NOT exposed. 3.5 X 2 (Active)  Dressing Type Gauze (Comment) 10/09/2014  3:30 AM  Dressing Changed 10/09/2014  3:30 AM  Dressing Change Frequency Every 5 days 09/26/2014 11:00 AM  State of Healing Early/partial granulation 09/26/2014 11:00 AM  Site / Wound Assessment Bleeding 09/29/2014  8:00 PM  % Wound base Red or Granulating 100% 09/29/2014  8:00 PM  % Wound base Yellow 75% 09/26/2014 11:00 AM  % Wound base Black 0% 09/26/2014 11:00 AM  % Wound base Other (Comment) 0% 09/26/2014 11:00 AM  Peri-wound Assessment Intact;Pink 09/26/2014 11:00 AM  Wound Length (cm) 3.5 cm 09/11/2014  8:00 AM  Wound Width (cm) 2 cm 09/11/2014  8:00 AM  Margins Unattached edges (unapproximated) 09/29/2014  8:00 PM  Drainage Amount Copious 10/07/2014  5:30 AM  Drainage Description Sanguineous 10/07/2014  5:30 AM  Treatment Cleansed;Other (Comment) 10/01/2014  9:30 AM     Pressure Ulcer 09/20/14 Deep Tissue Injury - Purple or maroon localized area of discolored intact skin or blood-filled blister due to damage of underlying soft tissue from pressure and/or shear. (Active)  Dressing Type Gauze (Comment) 10/14/2014  7:36 AM  Dressing Clean;Dry;Intact 10/14/2014  7:36 AM  Dressing Change Frequency Twice a day 10/01/2014  9:30 AM  State of Healing Non-healing 09/29/2014  8:04 PM  % Wound base Red or Granulating 90% 09/29/2014  8:04 PM  % Wound base Yellow 10% 09/29/2014  8:04 PM  Margins Unattached edges (unapproximated) 09/29/2014  8:04 PM  Drainage Amount Scant  09/29/2014  8:04 PM  Drainage Description Serosanguineous 09/29/2014  8:04 PM  Treatment Cleansed;Other (Comment) 09/29/2014  8:04 PM     Pressure Ulcer 09/20/14 Deep Tissue Injury - Purple or maroon localized area of discolored intact skin or blood-filled blister due to damage of underlying soft tissue from pressure and/or shear. (Active)  Dressing Type Gauze (Comment);ABD;Mesh briefs;Other (Comment) 10/14/2014  4:06 PM  Dressing Clean;Dry;Intact 10/14/2014  4:06 PM  Dressing Change Frequency Other (Comment) 10/14/2014  4:06 PM  State of Healing Eschar 10/14/2014  4:06 PM  Site / Wound Assessment Yellow;Painful;Granulation tissue 10/14/2014  4:06 PM  % Wound base Red or Granulating 5% 10/14/2014  4:06 PM  % Wound base Yellow 95% 10/14/2014  4:06 PM  % Wound base Black 0% 10/14/2014  4:06 PM  % Wound base Other (Comment) 0% 10/14/2014  4:06 PM  Peri-wound Assessment Pink;Intact 10/14/2014  4:06 PM  Wound Length (cm) 4.5 cm 10/14/2014  4:06 PM  Wound Width (cm) 5.6 cm 10/14/2014  4:06 PM  Wound Depth (cm) 2 cm 10/14/2014  4:06 PM  Margins Unattached edges (unapproximated) 10/14/2014  4:06 PM  Drainage Amount Moderate 10/14/2014  4:06 PM  Drainage Description Serosanguineous 10/14/2014  4:06 PM  Treatment Cleansed;Debridement (Selective);Hydrotherapy (Pulse lavage);Packing (Dry gauze);Other (Comment) 10/14/2014  4:06 PM     Wound 06/02/13 Abrasion(s) Leg Left (Active)  Dressing Type Gauze (Comment);ABD;Mesh briefs 10/14/2014  4:06 PM  Dressing Changed Changed 10/13/2014 11:45 PM  Dressing Status Clean;Dry;Intact 10/14/2014  4:06 PM  Dressing Change Frequency Daily 10/14/2014  4:06 PM  Site / Wound Assessment Red 10/14/2014  4:06 PM  % Wound base Red or Granulating 100% 10/14/2014  4:06 PM  % Wound base Yellow 0% 10/14/2014  4:06 PM  % Wound base Black 0% 10/14/2014  4:06 PM  % Wound base Other (Comment) 0% 10/14/2014  4:06 PM  Peri-wound Assessment Intact;Edema;Erythema (blanchable)  10/14/2014  4:06 PM  Margins Unattached edges (unapproximated) 10/14/2014  4:06 PM  Closure None 10/14/2014  4:06 PM  Drainage Amount Moderate 10/14/2014  4:06 PM  Drainage Description Serous 10/14/2014  4:06 PM  Non-staged Wound Description Full thickness 10/14/2014  4:06 PM  Treatment Hydrotherapy (Pulse lavage) 10/14/2014  4:06 PM     Wound / Incision (Open or Dehisced) 07/24/14 Incision - Open Leg Left (Active)     Wound / Incision (Open or Dehisced) 07/24/14 Incision - Open Thigh Right scabbed over circular area (Active)     Wound / Incision (Open or Dehisced) 07/29/14 Other (Comment) Thigh Left;Medial HYDRO (Active)     Wound / Incision (Open or Dehisced) 08/02/14 Other (Comment) Thigh Right;Medial Hydro, R medial thigh wound (Active)     Wound / Incision (Open or Dehisced) 09/10/14 Other (Comment) Thigh Medial;Left;Posterior;Proximal (Active)  Dressing Type Gauze (Comment);ABD;Mesh briefs;Other (Comment) 10/14/2014  4:06 PM  Dressing Changed Changed 10/13/2014 11:45 PM  Dressing Status Clean;Dry;Intact 10/14/2014  4:06 PM  Dressing Change Frequency Other (Comment) 10/13/2014 11:45 PM  Site / Wound Assessment Yellow;Painful;Pink;Bleeding 10/14/2014  4:06 PM  % Wound base Red or Granulating 0% 10/14/2014  4:06 PM  % Wound base Yellow 100% 10/14/2014  4:06 PM  % Wound base Black 0% 10/14/2014  4:06 PM  % Wound base Other (Comment) 0% 10/14/2014  4:06 PM  Peri-wound Assessment Edema;Other (Comment);Induration 10/14/2014  4:06 PM  Wound Length (cm) 8 cm 10/14/2014  4:06 PM  Wound Width (cm) 8.5 cm 10/14/2014  4:06 PM  Wound Depth (cm) 4.5 cm 10/14/2014  4:06 PM  Margins Unattached edges (unapproximated) 10/14/2014  4:06 PM  Closure None 10/14/2014  4:06 PM  Drainage Amount Copious 10/14/2014  4:06 PM  Drainage Description Odor;Serosanguineous 10/14/2014  4:06 PM  Non-staged Wound Description Full thickness 10/14/2014  4:06 PM  Treatment Cleansed;Debridement  (Selective);Hydrotherapy (Pulse lavage);Packing (Dry gauze);Other (Comment) 10/14/2014  4:06 PM     Wound / Incision (Open or Dehisced) 09/18/14 Other (Comment) Hip Right Black eschar tissue surrounded by pink tissue (Active)  Dressing Type Gauze (Comment);ABD;Mesh briefs 10/14/2014  4:06 PM  Dressing Changed Changed 10/14/2014  4:06 PM  Dressing Status Clean;Dry;Intact 10/14/2014  4:06 PM  Dressing Change Frequency Other (Comment) 10/13/2014 11:45 PM  Site / Wound Assessment Yellow;Pink;Bleeding;Pale 10/14/2014  4:06 PM  % Wound base Red or Granulating 5% 10/14/2014  4:06 PM  % Wound base Yellow 95% 10/14/2014  4:06 PM  % Wound base Black 0% 10/14/2014  4:06 PM  % Wound base Other (Comment) 0% 10/09/2014 10:08 AM  Peri-wound Assessment Edema;Pink;Erythema (non-blanchable);Induration 10/14/2014  4:06 PM  Wound Length (cm) 9 cm 10/14/2014  4:06 PM  Wound Width (cm) 8.5 cm 10/14/2014  4:06 PM  Wound Depth (cm) 1.5 cm 10/14/2014  4:06 PM  Margins Unattached edges (unapproximated) 10/14/2014  4:06 PM  Closure None 10/14/2014  4:06 PM  Drainage Amount Moderate 10/14/2014  4:06 PM  Drainage Description Odor;Serosanguineous 10/14/2014  4:06 PM  Non-staged Wound Description Full thickness 10/14/2014  4:06 PM  Treatment Cleansed;Debridement (Selective);Hydrotherapy (Pulse lavage);Packing (Dry gauze);Other (Comment) 10/14/2014  4:06 PM     Incision  05/05/12 Perineum Other (Comment) (Active)   Hydrotherapy Pulsed lavage therapy - wound location: Rt lateral hip, Lt lateral thigh, Lt proximal-posteriomedial thigh Pulsed Lavage with Suction (psi): 8 psi Pulsed Lavage with Suction - Normal Saline Used: 2000 mL Pulsed Lavage Tip: Tip with splash shield Selective Debridement Selective Debridement - Location: Rt lateral hip, Lt lateral thigh, Lt proximal-posteriomedial thigh Selective Debridement - Tools Used: Scissors;Scalpel;Forceps Selective Debridement - Tissue Removed: yellow/brown eschar, yellow  slough, fatty tissue   Wound Assessment and Plan  Wound Therapy - Assess/Plan/Recommendations Wound Therapy - Clinical Statement: Patient's wounds still mostly eschar, debridement is slow, due to low tolerance, but improving with increased dosages of dilaudid  Patient will continue to benefit from hydrotherapy to cleanse wounds and promote healing and this will be a long, steady process.   Wound Therapy - Functional Problem List: painful wounds limiting her mobility tolerance Factors Delaying/Impairing Wound Healing: Diabetes Mellitus;Infection - systemic/local;Immobility;Multiple medical problems;Vascular compromise Hydrotherapy Plan: Debridement;Dressing change;Patient/family education;Pulsatile lavage with suction Wound Therapy - Frequency: 6X / week Wound Therapy - Current Recommendations: WOC nurse Wound Therapy - Follow Up Recommendations: Other (comment) Wound Plan: see above  Wound Therapy Goals- Improve the function of patient's integumentary system by progressing the wound(s) through the phases of wound healing (inflammation - proliferation - remodeling) by: Decrease Necrotic Tissue to: 80 Decrease Necrotic Tissue - Progress: Goal set today Increase Granulation Tissue to: 20 Increase Granulation Tissue - Progress: Goal set today Improve Drainage Characteristics: Min Improve Drainage Characteristics - Progress: Goal set today Goals/treatment plan/discharge plan were made with and agreed upon by patient/family: Yes Time For Goal Achievement: 7 days Wound Therapy - Potential for Goals: Good  Goals will be updated until maximal potential achieved or discharge criteria met.  Discharge criteria: when goals achieved, discharge from hospital, MD decision/surgical intervention, no progress towards goals, refusal/missing three consecutive treatments without notification or medical reason.  GP     Oliviagrace Crisanti, Tessie Fass 10/14/2014, 4:21 PM 10/14/2014  Donnella Sham,  [place] [phone]  (pager)

## 2014-10-14 NOTE — Progress Notes (Signed)
OT Cancellation Note  Patient Details Name: [name] MRN: [id number] DOB: [birth year]   Cancelled Treatment:    Reason Eval/Treat Not Completed:  Pt with visitors upon initial attempt.  Receiving wound care with second attempt.  Will continue to follow.  Malka So 10/14/2014, 2:49 PM [phone]

## 2014-10-14 NOTE — Progress Notes (Signed)
ANTICOAGULATION CONSULT NOTE - Follow Up Consult  Pharmacy Consult for Coumadin Indication: atrial fibrillation  Patient Measurements: Height: 5' 6.93" (170 cm) Weight:  (unable to weigh pt d/t bed not zeroed) IBW/kg (Calculated) : 61.44  Labs:  Recent Labs  10/12/14 0510 10/13/14 0500 10/14/14 0415  HGB 8.9*  --   --   HCT 28.0*  --   --   PLT 295  --   --   LABPROT 35.7* 32.0* 28.2*  INR 3.57* 3.11* 2.62*  CREATININE 2.00* 2.09*  --     Estimated Creatinine Clearance: 38.2 ml/min (by C-G formula based on Cr of 2.09).  Assessment: INR 3.5> 2.6 after holding warfarin.   Coumadin had been held x 6 days prior to that with supratherapeutic INRs while on flagyl.  Pt still on interacting medications - fluconazole started 10/12 with plan to stop 10/16.   On amiodarone as prior to admission. Last CBC stable Home Coumadin dose was 2.5 mg on MWF and 5 mg on TTSS.  INR 3.4 on admit 09/09/14  Goal of Therapy:  INR 2-3 Monitor platelets by anticoagulation protocol: Yes   Plan:  Warfarin 1mg  x1 today Daily INR   Bonnita Nasuti Pharm.D. CPP, BCPS Clinical Pharmacist [phone] 10/14/2014 12:11 PM

## 2014-10-15 DIAGNOSIS — N390 Urinary tract infection, site not specified: Secondary | ICD-10-CM | POA: Diagnosis not present

## 2014-10-15 LAB — GLUCOSE, CAPILLARY
GLUCOSE-CAPILLARY: 87 mg/dL (ref 70–99)
GLUCOSE-CAPILLARY: 104 mg/dL — AB (ref 70–99)
Glucose-Capillary: 125 mg/dL — ABNORMAL HIGH (ref 70–99)
Glucose-Capillary: 98 mg/dL (ref 70–99)

## 2014-10-15 LAB — BASIC METABOLIC PANEL
Anion gap: 10 (ref 5–15)
BUN: 96 mg/dL — ABNORMAL HIGH (ref 6–23)
CHLORIDE: 91 meq/L — AB (ref 96–112)
CO2: 34 meq/L — AB (ref 19–32)
Calcium: 7.8 mg/dL — ABNORMAL LOW (ref 8.4–10.5)
Creatinine, Ser: 2.39 mg/dL — ABNORMAL HIGH (ref 0.50–1.10)
GFR calc Af Amer: 23 mL/min — ABNORMAL LOW (ref >90)
GFR calc non Af Amer: 20 mL/min — ABNORMAL LOW (ref >90)
GLUCOSE: 118 mg/dL — AB (ref 70–99)
POTASSIUM: 4.4 meq/L (ref 3.7–5.3)
SODIUM: 135 meq/L — AB (ref 137–147)

## 2014-10-15 LAB — PROTIME-INR
INR: 2.26 — ABNORMAL HIGH (ref 0.00–1.49)
Prothrombin Time: 25.1 seconds — ABNORMAL HIGH (ref 11.6–15.2)

## 2014-10-15 MED ORDER — WARFARIN SODIUM 2.5 MG PO TABS
2.5000 mg | ORAL_TABLET | Freq: Once | ORAL | Status: AC
  Administered 2014-10-15: 2.5 mg via ORAL
  Filled 2014-10-15: qty 1

## 2014-10-15 NOTE — Progress Notes (Signed)
Pt refused foley change, MD notified Rickard Rhymes, RN

## 2014-10-15 NOTE — Progress Notes (Signed)
Physical Therapy Treatment Patient Details Name: [name] MRN: [id number] DOB: Jul 04, 1950 Today's Date: 10/15/2014    History of Present Illness 64 y.o. female  with history of fibromyalgia, hypothyroidism, solitary kidney, hypothyroidism, diabetes, nonischemic cardiomyopathy with ejection fraction of 35%, atrial fibrillation who presented to the emergency department with left-sided lower leg pain. She was admitted on 9/10 w/ recurrent Lower extremity cellulitis. PCCM asked to see in consult on 9/11 for persistent hypotension and worsening renal failure.  She had a previous admission for LE cellulitis in 07/2014 followed by [facility].  Pt had returned home with HHPT prior to this admission.     PT Comments    Still needs encouragement, but is generally more willing to push herself through the pain.  Progress will be slow in coming.  Follow Up Recommendations  LTACH     Equipment Recommendations  None recommended by PT    Recommendations for Other Services       Precautions / Restrictions Precautions Precautions: Fall    Mobility  Bed Mobility Overal bed mobility: Needs Assistance;+2 for physical assistance   Rolling: Min assist   Supine to sit: Max assist;+2 for physical assistance Sit to supine: Max assist;+2 for physical assistance   General bed mobility comments: Rail use for rolling over onto R elbow and then truncal assist to come up.  LE are symmetrically assisted to EOB in a "helicopter-like " maneuver.  Transfers Overall transfer level: Needs assistance Equipment used: Rolling walker (2 wheeled) Transfers: Sit to/from Stand Sit to Stand: +2 physical assistance;Max assist         General transfer comment: pt stood x3 ranging from 5 secs to 15 secs.  Ambulation/Gait             General Gait Details: not Doctor, general practice Rankin (Stroke Patients Only)       Balance Overall balance assessment:  Needs assistance Sitting-balance support: Feet supported;No upper extremity supported;Single extremity supported Sitting balance-Leahy Scale: Fair Sitting balance - Comments: sat EOB while doing a few sitting exercises     Standing balance-Leahy Scale: Poor Standing balance comment: 3 trials of standing limited due to pt unable to bend R knee enough                    Cognition Arousal/Alertness: Awake/alert Behavior During Therapy: Anxious Overall Cognitive Status: Within Functional Limits for tasks assessed                      Exercises General Exercises - Lower Extremity Long Arc Quad: AAROM;Strengthening;Both;10 reps;Seated Straight Leg Raises: AROM;Strengthening;Both;10 reps;Supine Other Exercises Other Exercises: bicep and tricep presses not isolated to get shd work x10 resisted.    General Comments        Pertinent Vitals/Pain Pain Assessment: Faces Faces Pain Scale: Hurts whole lot Pain Location: back of legs Pain Descriptors / Indicators: Burning;Constant;Jabbing Pain Intervention(s): Premedicated before session    Home Living                      Prior Function            PT Goals (current goals can now be found in the care plan section) Acute Rehab PT Goals PT Goal Formulation: With patient Time For Goal Achievement: 10/27/14 Potential to Achieve Goals: Fair Progress towards PT goals: Progressing toward goals  Frequency  Min 2X/week    PT Plan Current plan remains appropriate    Co-evaluation             End of Session Equipment Utilized During Treatment: Gait belt   Patient left: in bed;with call bell/phone within reach     Time: 0942-1025 PT Time Calculation (min): 43 min  Charges:  $Therapeutic Activity: 38-52 mins                    G Codes:      Lajuane Leatham, Tessie Fass 10/15/2014, 10:56 AM 10/15/2014  Donnella Sham, PT [phone]  (pager)

## 2014-10-15 NOTE — Consult Note (Signed)
I have reviewed and discussed case with Nurse Practitioner And agree with documentation and plan as noted above   Renate Danh J. Hadyn Blanck D.O.  Palliative Medicine Team at [place]  Team Phone: [phone]    

## 2014-10-15 NOTE — Progress Notes (Signed)
Pt refuses cpap for the night 

## 2014-10-15 NOTE — Progress Notes (Signed)
Progress Note from the Palliative Medicine Team at [place]:   -patient is alert and oriented, tells me she "feels better"  -focused on her limited options for discharge and the "unfairness of it all"  -continued conversation regarding importance of continued clarification of her GOC   Objective: Allergies  Allergen Reactions  . Ivp Dye [Iodinated Diagnostic Agents] Shortness Of Breath    Turn red, can't breathe  . Betadine [Povidone Iodine]   . Diphenhydramine Hcl Swelling    In hands and eyes  . Fish Allergy Nausea And Vomiting  . Iohexol      Desc: PT TURNS RED AND WHEEZING   . Penicillins Other (See Comments)    Resp arrest as child. Tolerates cephalosporins.  . Povidone-Iodine Other (See Comments)    Wheezing, turn red, can't breath, and blisters  . Promethazine Hcl Other (See Comments)    Low Blood Pressure  . Tape     Blisters, can use paper tape for short periods  . Clindamycin Hives and Rash    wheezing  . Iodine Rash  . Morphine Sulfate Nausea And Vomiting and Rash  . Primaxin [Imipenem] Rash   Scheduled Meds: . amiodarone  400 mg Oral Daily  . calcitRIOL  0.25 mcg Oral BID  . calcium-vitamin D  1 tablet Oral BID  . cefTRIAXone (ROCEPHIN)  IV  1 g Intravenous Q24H  . collagenase   Topical TID  . febuxostat  80 mg Oral Daily  . fluconazole  100 mg Oral Daily  .  HYDROmorphone (DILAUDID) injection  2 mg Intravenous Daily  . insulin aspart  0-15 Units Subcutaneous TID WC  . insulin aspart  0-5 Units Subcutaneous QHS  . insulin aspart  2 Units Subcutaneous TID WC  . insulin glargine  22 Units Subcutaneous QHS  . iron polysaccharides  150 mg Oral BID  . levothyroxine  125 mcg Oral QAC breakfast  . linagliptin  5 mg Oral Daily  . metolazone  2.5 mg Oral QODAY  . multivitamin with minerals  1 tablet Oral Daily  . saccharomyces boulardii  250 mg Oral BID  . senna  2 tablet Oral BID  . silver nitrate applicators  1 application Topical Once  .  sodium chloride  3 mL Intravenous Q12H  . torsemide  60 mg Oral BID  . warfarin  2.5 mg Oral ONCE-1800  . Warfarin - Pharmacist Dosing Inpatient   Does not apply q1800   Continuous Infusions:  PRN Meds:.albuterol, ALPRAZolam, bisacodyl, calcium carbonate, docusate sodium, HYDROcodone-acetaminophen, HYDROmorphone (DILAUDID) injection, hydrOXYzine, nitroGLYCERIN, sodium chloride, sodium chloride  BP 88/49  Pulse 94  Temp(Src) 98.5 F (36.9 C) (Oral)  Resp 18  Ht 5' 6.93" (1.7 m)  Wt 136.533 kg (301 lb)  BMI 47.24 kg/m2  SpO2 94%   PPS:30 % at best    Intake/Output Summary (Last 24 hours) at 10/15/14 1316 Last data filed at 10/15/14 0423  Gross per 24 hour  Intake      0 ml  Output   1050 ml  Net  -1050 ml       Physical Exam:   General: chronically ill appearing, NAD  HEENT: moist buccal membranes, poor dentation  CVS: Tachycardic, rate 108  Chest; distant breath sounds, decreased in bases  Abdomen: obese, soft Distant +BS  Ext: BLEs with erythema and skin thickening  Neuro: alert and oriented X3, engaged in today's conversation with family   Labs: CBC    Component Value Date/Time  WBC 10.8* 10/12/2014 0510   WBC 5.8 07/21/2009 1141   RBC 2.79* 10/12/2014 0510   RBC 2.34* 09/27/2014 0925   RBC 3.92 07/21/2009 1141   HGB 8.9* 10/12/2014 0510   HGB 11.9 07/21/2009 1141   HCT 28.0* 10/12/2014 0510   HCT 36.2 07/21/2009 1141   PLT 295 10/12/2014 0510   PLT 153 07/21/2009 1141   MCV 100.4* 10/12/2014 0510   MCV 92.4 07/21/2009 1141   MCH 31.9 10/12/2014 0510   MCH 30.3 07/21/2009 1141   MCHC 31.8 10/12/2014 0510   MCHC 32.8 07/21/2009 1141   RDW 19.6* 10/12/2014 0510   RDW 16.5* 07/21/2009 1141   LYMPHSABS 0.3* 09/13/2014 0342   LYMPHSABS 0.9 07/21/2009 1141   MONOABS 0.4 09/13/2014 0342   MONOABS 0.3 07/21/2009 1141   EOSABS 0.0 09/13/2014 0342   EOSABS 0.1 07/21/2009 1141   BASOSABS 0.0 09/13/2014 0342   BASOSABS 0.0 07/21/2009 1141    BMET    Component Value  Date/Time   NA 135* 10/15/2014 0405   K 4.4 10/15/2014 0405   CL 91* 10/15/2014 0405   CO2 34* 10/15/2014 0405   GLUCOSE 118* 10/15/2014 0405   BUN 96* 10/15/2014 0405   CREATININE 2.39* 10/15/2014 0405   CREATININE 2.10* 05/28/2014 0001   CREATININE 1.28* 02/12/2011 1000   CALCIUM 7.8* 10/15/2014 0405   CALCIUM 9.5 03/24/2012 1212   GFRNONAA 20* 10/15/2014 0405   GFRAA 23* 10/15/2014 0405    CMP     Component Value Date/Time   NA 135* 10/15/2014 0405   K 4.4 10/15/2014 0405   CL 91* 10/15/2014 0405   CO2 34* 10/15/2014 0405   GLUCOSE 118* 10/15/2014 0405   BUN 96* 10/15/2014 0405   CREATININE 2.39* 10/15/2014 0405   CREATININE 2.10* 05/28/2014 0001   CREATININE 1.28* 02/12/2011 1000   CALCIUM 7.8* 10/15/2014 0405   CALCIUM 9.5 03/24/2012 1212   PROT 6.2 10/12/2014 0510   ALBUMIN 1.4* 10/12/2014 0510   AST 32 10/12/2014 0510   ALT 17 10/12/2014 0510   ALKPHOS 149* 10/12/2014 0510   BILITOT 1.1 10/12/2014 0510   GFRNONAA 20* 10/15/2014 0405   GFRAA 23* 10/15/2014 0405     Assessment and Plan: 1. Code Status: Partial code 2. Symptom Control: -Weakness: continue PT/OT, discharge to SNF for rehabilitation, educated on initiation of self exercise program with AROM exercises demonstrated  3.Psycho/Social:  Emotional support offered to patirnt 4. Spiritual  Chaplin involved 5. Disposition:  Pending, patient remains hopeful for LTAC  Patient Documents Completed or Given: Document Given Completed  Advanced Directives Pkt    MOST X   DNR    Gone from My Sight    Hard Choices X     Time In Time Out Total Time Spent with Patient Total Overall Time  0800 0825 25 min 94min    Greater than 50%  of this time was spent counseling and coordinating care related to the above assessment and plan.  Wadie Lessen NP  Palliative Medicine Team Team Phone # [phone] Pager [phone]   1

## 2014-10-15 NOTE — Progress Notes (Signed)
NUTRITION FOLLOW UP  INTERVENTION:  Continue snacks TID  Continue MVI  NUTRITION DIAGNOSIS: Increased nutrient needs related to wound healing as evidenced by estimated nutrition needs, ongoing  Goal: Pt to meet >/= 90% of their estimated nutrition needs, progressing  Monitor:  PO intake, weight, labs, I/O's  ASSESSMENT: 64 y.o. Female with history of fibromyalgia, hypothyroidism, solitary kidney, hypothyroidism, diabetes, nonischemic cardiomyopathy with ejection fraction of 35%, atrial fibrillation who presented to ED with left-sided lower leg pain.  Snacks ordered TID.  Goals of care meeting with Palliative Care Team 10/14. Pt is a limited code, she does not desire TF. Wounds receiving hydrotherapy.  Pt currently receiving hydrotherapy. Pt's intake is variable, she either refuses meals or eats 75-100%. Nutritional services providing snacks.   Labs: Sodium low BUN/Cr elevated CBG's: 87-125  Height: Ht Readings from Last 1 Encounters:  09/09/14 5' 6.93" (1.7 m)    Weight: Wt Readings from Last 1 Encounters:  09/02/14 304 lb (137.893 kg)    BMI:  Body mass index is 47.24 kg/(m^2).  Estimated Nutritional Needs: Kcal: 1700-1900 Protein: 100-110 gm Fluid: 1.7-1.9 L  Skin:  Left thigh unstageable wound Left posterior thigh unstageable wound Right hip unstageable wound Left outer calf with partial thickness stasis ulcers Stage II left buttock wound  Diet Order: Heart Healthy/Carbohydrate Modified  Meal Completion: 0-75%   Intake/Output Summary (Last 24 hours) at 10/15/14 1401 Last data filed at 10/15/14 0423  Gross per 24 hour  Intake      0 ml  Output   1050 ml  Net  -1050 ml   Last BM: 10/15 Digitally disimpacted 10/13. Pt constipated, now on colace and senna.   Labs:   Recent Labs Lab 10/12/14 1640 10/13/14 0500 10/14/14 1155 10/15/14 0405  NA  --  136* 135* 135*  K  --  3.7 3.6* 4.4  CL  --  92* 90* 91*  CO2  --  34* 33* 34*  BUN  --  92*  93* 96*  CREATININE  --  2.09* 2.27* 2.39*  CALCIUM  --  7.9* 7.8* 7.8*  MG 1.8  --  1.8  --   GLUCOSE  --  137* 138* 118*    CBG (last 3)   Recent Labs  10/14/14 2104 10/15/14 0523 10/15/14 1120  GLUCAP 128* 125* 87    Scheduled Meds: . amiodarone  400 mg Oral Daily  . calcitRIOL  0.25 mcg Oral BID  . calcium-vitamin D  1 tablet Oral BID  . cefTRIAXone (ROCEPHIN)  IV  1 g Intravenous Q24H  . collagenase   Topical TID  . febuxostat  80 mg Oral Daily  . fluconazole  100 mg Oral Daily  .  HYDROmorphone (DILAUDID) injection  2 mg Intravenous Daily  . insulin aspart  0-15 Units Subcutaneous TID WC  . insulin aspart  0-5 Units Subcutaneous QHS  . insulin aspart  2 Units Subcutaneous TID WC  . insulin glargine  22 Units Subcutaneous QHS  . iron polysaccharides  150 mg Oral BID  . levothyroxine  125 mcg Oral QAC breakfast  . linagliptin  5 mg Oral Daily  . metolazone  2.5 mg Oral QODAY  . multivitamin with minerals  1 tablet Oral Daily  . saccharomyces boulardii  250 mg Oral BID  . senna  2 tablet Oral BID  . silver nitrate applicators  1 application Topical Once  . sodium chloride  3 mL Intravenous Q12H  . torsemide  60 mg Oral BID  .  warfarin  2.5 mg Oral ONCE-1800  . Warfarin - Pharmacist Dosing Inpatient   Does not apply q1800    Continuous Infusions:   [place], Celeste, [place] Pager [phone] After Hours Pager

## 2014-10-15 NOTE — Progress Notes (Signed)
Physical Therapy Wound Treatment Patient Details  Name: [name] MRN: [id number] Date of Birth: 1950-01-03  Today's Date: 10/15/2014 Time: 6381-7711 Time Calculation (min): 71 min  Subjective  Patient and Family Stated Goals: decr pain in wounds and heal without surgery  Pain Score: Pain Score: 6   Wound Assessment  Pressure Ulcer 09/10/14 Stage III -  Full thickness tissue loss. Subcutaneous fat may be visible but bone, tendon or muscle are NOT exposed. 3.5 X 2 (Active)  Dressing Type Gauze (Comment) 10/09/2014  3:30 AM  Dressing Changed 10/09/2014  3:30 AM  Dressing Change Frequency Every 5 days 09/26/2014 11:00 AM  State of Healing Early/partial granulation 09/26/2014 11:00 AM  Site / Wound Assessment Bleeding 09/29/2014  8:00 PM  % Wound base Red or Granulating 100% 09/29/2014  8:00 PM  % Wound base Yellow 75% 09/26/2014 11:00 AM  % Wound base Black 0% 09/26/2014 11:00 AM  % Wound base Other (Comment) 0% 09/26/2014 11:00 AM  Peri-wound Assessment Intact;Pink 09/26/2014 11:00 AM  Wound Length (cm) 3.5 cm 09/11/2014  8:00 AM  Wound Width (cm) 2 cm 09/11/2014  8:00 AM  Margins Unattached edges (unapproximated) 09/29/2014  8:00 PM  Drainage Amount Copious 10/07/2014  5:30 AM  Drainage Description Sanguineous 10/07/2014  5:30 AM  Treatment Cleansed;Other (Comment) 10/01/2014  9:30 AM     Pressure Ulcer 09/20/14 Deep Tissue Injury - Purple or maroon localized area of discolored intact skin or blood-filled blister due to damage of underlying soft tissue from pressure and/or shear. (Active)  Dressing Type Gauze (Comment) 10/14/2014  7:36 AM  Dressing Clean;Dry;Intact 10/14/2014  7:36 AM  Dressing Change Frequency Twice a day 10/01/2014  9:30 AM  State of Healing Non-healing 09/29/2014  8:04 PM  % Wound base Red or Granulating 90% 09/29/2014  8:04 PM  % Wound base Yellow 10% 09/29/2014  8:04 PM  Margins Unattached edges (unapproximated) 09/29/2014  8:04 PM  Drainage Amount Scant 09/29/2014   8:04 PM  Drainage Description Serosanguineous 09/29/2014  8:04 PM  Treatment Cleansed;Other (Comment) 09/29/2014  8:04 PM     Pressure Ulcer 09/20/14 Deep Tissue Injury - Purple or maroon localized area of discolored intact skin or blood-filled blister due to damage of underlying soft tissue from pressure and/or shear. (Active)  Dressing Type Gauze (Comment);ABD;Mesh briefs;Other (Comment) 10/15/2014  3:28 PM  Dressing Clean;Dry;Intact 10/15/2014  3:28 PM  Dressing Change Frequency Other (Comment) 10/15/2014  3:28 PM  State of Healing Eschar 10/15/2014  3:28 PM  Site / Wound Assessment Yellow;Painful;Granulation tissue 10/15/2014  3:28 PM  % Wound base Red or Granulating 5% 10/15/2014  3:28 PM  % Wound base Yellow 95% 10/15/2014  3:28 PM  % Wound base Black 0% 10/15/2014  3:28 PM  % Wound base Other (Comment) 0% 10/15/2014  3:28 PM  Peri-wound Assessment Pink;Intact 10/15/2014  3:28 PM  Wound Length (cm) 4.5 cm 10/14/2014  4:06 PM  Wound Width (cm) 5.6 cm 10/14/2014  4:06 PM  Wound Depth (cm) 2 cm 10/14/2014  4:06 PM  Margins Unattached edges (unapproximated) 10/15/2014  3:28 PM  Drainage Amount Moderate 10/15/2014  3:28 PM  Drainage Description Serosanguineous 10/15/2014  3:28 PM  Treatment Cleansed;Debridement (Selective);Hydrotherapy (Pulse lavage);Packing (Dry gauze);Other (Comment) 10/15/2014  3:28 PM     Wound 06/02/13 Abrasion(s) Leg Left (Active)  Dressing Type Gauze (Comment);ABD;Mesh briefs 10/15/2014  3:28 PM  Dressing Changed Changed 10/13/2014 11:45 PM  Dressing Status Clean;Dry;Intact 10/15/2014  3:28 PM  Dressing Change Frequency Daily 10/15/2014  3:28  PM  Site / Wound Assessment Red 10/15/2014  3:28 PM  % Wound base Red or Granulating 100% 10/15/2014  3:28 PM  % Wound base Yellow 0% 10/15/2014  3:28 PM  % Wound base Black 0% 10/15/2014  3:28 PM  % Wound base Other (Comment) 0% 10/15/2014  3:28 PM  Peri-wound Assessment Intact;Edema;Erythema (blanchable) 10/15/2014  3:28  PM  Margins Unattached edges (unapproximated) 10/15/2014  3:28 PM  Closure None 10/15/2014  3:28 PM  Drainage Amount Moderate 10/15/2014  3:28 PM  Drainage Description Serous 10/15/2014  3:28 PM  Non-staged Wound Description Full thickness 10/15/2014  3:28 PM  Treatment Hydrotherapy (Pulse lavage) 10/15/2014  3:28 PM     Wound / Incision (Open or Dehisced) 07/24/14 Incision - Open Leg Left (Active)     Wound / Incision (Open or Dehisced) 07/24/14 Incision - Open Thigh Right scabbed over circular area (Active)     Wound / Incision (Open or Dehisced) 07/29/14 Other (Comment) Thigh Left;Medial HYDRO (Active)     Wound / Incision (Open or Dehisced) 08/02/14 Other (Comment) Thigh Right;Medial Hydro, R medial thigh wound (Active)     Wound / Incision (Open or Dehisced) 09/10/14 Other (Comment) Thigh Medial;Left;Posterior;Proximal (Active)  Dressing Type Gauze (Comment);ABD;Mesh briefs;Other (Comment) 10/15/2014  3:28 PM  Dressing Changed Changed 10/15/2014  3:28 PM  Dressing Status Clean;Dry;Intact 10/15/2014  3:28 PM  Dressing Change Frequency Other (Comment) 10/15/2014  3:28 PM  Site / Wound Assessment Yellow;Painful;Bleeding 10/15/2014  3:28 PM  % Wound base Red or Granulating 0% 10/15/2014  3:28 PM  % Wound base Yellow 100% 10/15/2014  3:28 PM  % Wound base Black 0% 10/15/2014  3:28 PM  % Wound base Other (Comment) 0% 10/15/2014  3:28 PM  Peri-wound Assessment Edema;Other (Comment);Induration 10/15/2014  3:28 PM  Wound Length (cm) 8 cm 10/14/2014  4:06 PM  Wound Width (cm) 8.5 cm 10/14/2014  4:06 PM  Wound Depth (cm) 4.5 cm 10/14/2014  4:06 PM  Margins Unattached edges (unapproximated) 10/15/2014  3:28 PM  Closure None 10/15/2014  3:28 PM  Drainage Amount Copious 10/15/2014  3:28 PM  Drainage Description Odor;Serosanguineous 10/15/2014  3:28 PM  Non-staged Wound Description Full thickness 10/15/2014  3:28 PM  Treatment Cleansed;Debridement (Selective);Hydrotherapy (Pulse  lavage);Packing (Dry gauze);Other (Comment) 10/15/2014  3:28 PM     Wound / Incision (Open or Dehisced) 09/18/14 Other (Comment) Hip Right Black eschar tissue surrounded by pink tissue (Active)  Dressing Type Gauze (Comment);ABD;Mesh briefs 10/15/2014  3:28 PM  Dressing Changed Changed 10/15/2014  3:28 PM  Dressing Status Clean;Dry;Intact 10/15/2014  3:28 PM  Dressing Change Frequency Other (Comment) 10/13/2014 11:45 PM  Site / Wound Assessment Yellow;Bleeding;Pale;Red 10/15/2014  3:28 PM  % Wound base Red or Granulating 5% 10/15/2014  3:28 PM  % Wound base Yellow 95% 10/15/2014  3:28 PM  % Wound base Black 0% 10/15/2014  3:28 PM  % Wound base Other (Comment) 0% 10/15/2014  3:28 PM  Peri-wound Assessment Edema;Pink;Erythema (non-blanchable);Induration 10/15/2014  3:28 PM  Wound Length (cm) 9.4 cm 10/15/2014  3:28 PM  Wound Width (cm) 8 cm 10/15/2014  3:28 PM  Wound Depth (cm) 1.5 cm 10/15/2014  3:28 PM  Margins Unattached edges (unapproximated) 10/15/2014  3:28 PM  Closure None 10/15/2014  3:28 PM  Drainage Amount Moderate 10/15/2014  3:28 PM  Drainage Description Odor;Serosanguineous 10/15/2014  3:28 PM  Non-staged Wound Description Full thickness 10/15/2014  3:28 PM  Treatment Cleansed;Debridement (Selective);Hydrotherapy (Pulse lavage);Packing (Dry gauze);Other (Comment) 10/15/2014  3:28 PM  Incision 05/05/12 Perineum Other (Comment) (Active)   Hydrotherapy Pulsed lavage therapy - wound location: Rt lateral hip, Lt lateral thigh, Lt proximal-posteriomedial thigh Pulsed Lavage with Suction (psi): 8 psi Pulsed Lavage with Suction - Normal Saline Used: 2000 mL Pulsed Lavage Tip: Tip with splash shield Selective Debridement Selective Debridement - Location: Rt lateral hip, Lt lateral thigh, Lt proximal-posteriomedial thigh Selective Debridement - Tools Used: Scissors;Scalpel;Forceps Selective Debridement - Tissue Removed: yellow/brown eschar, yellow slough, fatty tissue   Wound  Assessment and Plan  Wound Therapy - Assess/Plan/Recommendations Wound Therapy - Clinical Statement: Patient's wounds still mostly eschar, debridement is slow, due to low tolerance, but improving with increased dosages of dilaudid  Patient will continue to benefit from hydrotherapy to cleanse wounds and promote healing and this will be a long, steady process.   Wound Therapy - Functional Problem List: painful wounds limiting her mobility tolerance Factors Delaying/Impairing Wound Healing: Diabetes Mellitus;Infection - systemic/local;Immobility;Multiple medical problems;Vascular compromise Hydrotherapy Plan: Debridement;Dressing change;Patient/family education;Pulsatile lavage with suction Wound Therapy - Frequency: 6X / week Wound Therapy - Current Recommendations: WOC nurse Wound Therapy - Follow Up Recommendations: Other (comment) Wound Plan: see above  Wound Therapy Goals- Improve the function of patient's integumentary system by progressing the wound(s) through the phases of wound healing (inflammation - proliferation - remodeling) by: Decrease Necrotic Tissue to: <75% Decrease Necrotic Tissue - Progress: Progressing toward goal Increase Granulation Tissue to: >25% Increase Granulation Tissue - Progress: Progressing toward goal Improve Drainage Characteristics: Min Improve Drainage Characteristics - Progress: Not progressing Goals/treatment plan/discharge plan were made with and agreed upon by patient/family: Yes Time For Goal Achievement: 7 days Wound Therapy - Potential for Goals: Good  Goals will be updated until maximal potential achieved or discharge criteria met.  Discharge criteria: when goals achieved, discharge from hospital, MD decision/surgical intervention, no progress towards goals, refusal/missing three consecutive treatments without notification or medical reason.  GP     Kessler Kopinski, Tessie Fass 10/15/2014, 3:40 PM 10/15/2014  Donnella Sham,  [place] [phone]  (pager)

## 2014-10-15 NOTE — Progress Notes (Addendum)
CARE MANAGEMENT NOTE 10/15/2014  Patient:  [name],[name]   Account Number:  [id number]  Date Initiated:  09/14/2014  Documentation initiated by:  AMERSON,JULIE  Subjective/Objective Assessment:   Pt adm on 09/09/14 with cellulitis, sepsis.  PTA, pt resided at home with spouse.  Recent fall at home.     Action/Plan:   PT/OT recommending SNF at dc.  CSW following to facilitate dc to SNF when medically stable.   Anticipated DC Date:  10/08/2014   Anticipated DC Plan:  SKILLED NURSING FACILITY  In-house referral  Clinical Social Worker      DC Planning Services  CM consult      Choice offered to / List presented to:             Status of service:  In process, will continue to follow Medicare Important Message given?  YES (If response is "NO", the following Medicare IM given date fields will be blank) Date Medicare IM given:  09/14/2014 Medicare IM given by:  AMERSON,JULIE Date Additional Medicare IM given:  09/27/2014 Additional Medicare IM given by:  JULIE AMERSON  Discharge Disposition:    Per UR Regulation:  Reviewed for med. necessity/level of care/duration of stay  If discussed at [place] of Stay Meetings, dates discussed:   09/16/2014  09/21/2014  09/28/2014  09/30/2014    Comments:  10/15/2014 1600 NCM spoke to pt and states her Medicare A&B will not start until Dec 31, 2014. Will have possible revisit SNF placement if Medicare does not begin until Jan 1. NCM spoke to sister, Kieth Brightly # [phone] and states traditional Medicare A/B will not start Dec 31, 2014. Waiting for final recommendation for dc.  Jonnie Finner RN CCM Case Mgmt phone [phone]  10/15/2014 1100 Additional Medicare IM message given. Jonnie Finner RN CCM Case Mgmt phone [phone]   10/01/14 Ellan Lambert, RN, BSN [phone] 1:30pm Met with pt and her sister, Kieth Brightly, to discuss discharge plans.  Sister and pt discussed pt's history and recent events of pt's medical problems.  Pt had a somewhat  terrible experience at a local nursing facility, and is understandably hesitant to go to another skilled nursing facility.  Pt/sister desire for her to go to [facility], and sister is aware that if pt had traditional Medicare, LTAC coverage would not be a problem. Sister plans to call insurance company to see if managed care plan can be changed to a supplement. Pt and sister are somewhat upset, as they feel pt is about to be "kicked out" of the hospital.  Assured pt that pt is not medically stable for dc at this point, and would remain in hospital until that time. Sister plans to notify me if she is able to change managed care to regular medicare + supplement.  They are appreciative of my visit.  09/21/14 Ellan Lambert, RN, BSN [phone] CSW cont to follow for SNF placement, as recommended by PT/OT.  Cont aggressive diuresis with IV lasix.  Will follow progress.

## 2014-10-15 NOTE — [facility] (Signed)
PROGRESS NOTE    [name] SWF:[id number] DOB: 02/09/1950 DOA: 09/09/2014 PCP: Elby Showers, MD  HPI/Brief narrative 64 y.o. female with history of Morbid obesity, CKD 4, fibromyalgia, hypothyroidism, solitary kidney, hypothyroidism, diabetes, nonischemic cardiomyopathy with ejection fraction of 35%, atrial fibrillation who was admitted on 9/10 w/ recurrent Lower extremity cellulitis, immediately went into Septic shock- was in ICU on pressors until 9/14. Subsequently transferred to the Floor. Has completed antibiotics. Undergoing wound care/hydrotherapy per PT under CCS supervision. Cardiology had signed off-family requested reconsult. Palliative care team consulted for goals of care. Discharged disposition pending-case management working on it.   Assessment/Plan:  1. Septic shock: Due to bilateral leg cellulitis. Required vasopressors in ICU. Resolved.  2. Multiple bilateral lower extremity wounds: Completed antibiotic course. Surgery & WOC followup appreciated. Continue hydrotherapy with PT. She will need OP follow up with plastic surgery. As per discussion with case management, if no SNF available to manage wound care, may be eligible for LTAC. Significant pain during wound care- much better controlled with Dilaudid 2 mg IV x1 prior to dressing changes. Management per surgery. 3. Acute on chronic combined systolic and diastolic CHF/anasarca: Difficult to assess volume status despite large volume diuresis. Weights have been inaccurate and not regularly recorded. Cardiology switched her to oral diuretics-torsemide and metolazone. Marked hypoalbuminemia also contributing to anasarca. Swelling of bilateral legs probably from chronic venous stasis. Cardiology signed off. Patient continues to improve. Please call the [facility] office when she is ready for discharge and cardiology will arrange a f/u appt with Dr Sallyanne Kuster or his PA/NP. Please call if cardiac issues arise.Stable. Placed back on  telemetry on 10/12 due to concern for arrhythmias while on fluconazole. 4. A. Fib/s/p PPM: Controlled ventricular rate. Coumadin per pharmacy.  5. Hypotension: resolved 6. 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 Continue Lantus, NovoLog mealtime and SSI. 7. Hypothyroid: Continue levothyroxine. Recent TSH 2 on 05/29/1511 8. History of gout: Stable  9. History of OHS/morbid obesity: Nightly CPAP 10. Tremors: Gabapentin discontinued. Seems to have resolved.  11. Acute on stage IV chronic kidney disease: No baseline creatinine probably in the low 2 range. Creatinine gradually improving. Follow BMP periodically. 12. RUE swelling: Dopplers negative for DVT. Likely from anasarca. 13. Anemia: Secondary to chronic kidney disease, acute illness and iron deficiency: Stable 14. Hypokalemia: Replaced 15. Fecal impaction/Constipation: Significant relief after digital disimpaction and enema 10/13. She wishes to continue Colace and senna but declines MiraLAX. Likely multifactorial from immobility, pain medications, poor diet and iron supplements. 16. Fungal rash (of back and back of arms): Seems extensive and less likely to improve from topical nystatin. Brief course of by mouth fluconazole-DC after 5 days. Aware of QTC 560 ms-discussed with primary cardiologist on 10/12 who suggested that QT is much longer with a paced beat and recommended going ahead with fluconazole and following daily EKGs  17.  UTI: urine cultures ordered and started on rocephin.  18. Prolonged QTC: Please see discussion in #16. Try to keep potassium greater than >4 and magnesium >2. Her QTC is improving.   Code Status: Full Family Communication: Discussed with patient's sister [name] at bedside. Disposition Plan: LTAC vs SNF when bed available-approaching medical stability for discharge. Discussed at length with patient's sister [name] at bedside on10/15  Consultants:  General surgery    Cardiology  Critical care medicine-signed off  Nephrology-signed off  [place] - pending-discussed with the palliative care team and will try to arrange meeting  today.  Procedures:  Central venous catheter insertion procedure on 09/10/2014 >Will attempt to try for a peripheral IV line, then DC central line.  Hydrotherapy   Antibiotics:  Completed all antibiotics 9/23   Subjective: FEEL LITTLE BETTER THAN YESTERDAY. Still frustrated about not being able to switch the insurances. Refused to allow [place] to change the foley catheter. Requested to do it tomorrow.   Objective: Filed Vitals:   10/14/14 1618 10/14/14 2047 10/15/14 0422 10/15/14 0734  BP: 102/37 87/55 99/57  88/49  Pulse: 101 92 94   Temp:  98 F (36.7 C) 98.5 F (36.9 C)   TempSrc:  Oral Oral   Resp:  18 18   Height:      Weight:      SpO2:  99% 94%     Intake/Output Summary (Last 24 hours) at 10/15/14 1110 Last data filed at 10/15/14 0423  Gross per 24 hour  Intake      0 ml  Output   1050 ml  Net  -1050 ml   Filed Weights   10/06/14 2300 10/08/14 0423  Weight: 138.801 kg (306 lb) 136.533 kg (301 lb)     Exam:  General exam: Moderately built and morbidly obese female lying comfortably propped up in bed. Appears pleasant and cheerful this morning. Respiratory system: Clear. No increased work of breathing. Cardiovascular system: S1 & S2 heard, RRR. No JVD, murmurs, gallops, clicks. 2+ pitting bilateral leg edema/anasarca-leg edema appears chronic. Telemetry: A. fib with ventricular rate in the 90s. Gastrointestinal system: Abdomen is nondistended, soft and nontender. Normal bowel sounds heard. Central nervous system: Alert and oriented. No focal neurological deficits. Extremities: Symmetric 5 x 5 power.Multiple bilateral lower extremity wounds-details as per daily PT note. Chronic and patchy mild bilateral lower leg faint erythema-stable. Skin: Extensive patchy redness of back and back  of bilateral arms suggestive of fungal infection- seem much better.  Data Reviewed: Basic Metabolic Panel:  Recent Labs Lab 10/09/14 0605 10/12/14 0510 10/12/14 1640 10/13/14 0500 10/14/14 1155 10/15/14 0405  NA 135* 139  --  136* 135* 135*  K 3.8 3.5*  --  3.7 3.6* 4.4  CL 93* 93*  --  92* 90* 91*  CO2 30 35*  --  34* 33* 34*  GLUCOSE 136* 98  --  137* 138* 118*  BUN 97* 90*  --  92* 93* 96*  CREATININE 2.26* 2.00*  --  2.09* 2.27* 2.39*  CALCIUM 7.4* 8.0*  --  7.9* 7.8* 7.8*  MG  --   --  1.8  --  1.8  --    Liver Function Tests:  Recent Labs Lab 10/12/14 0510  AST 32  ALT 17  ALKPHOS 149*  BILITOT 1.1  PROT 6.2  ALBUMIN 1.4*   No results found for this basename: LIPASE, AMYLASE,  in the last 168 hours No results found for this basename: AMMONIA,  in the last 168 hours CBC:  Recent Labs Lab 10/09/14 0605 10/12/14 0510  WBC 8.2 10.8*  HGB 8.7* 8.9*  HCT 27.4* 28.0*  MCV 98.9 100.4*  PLT 316 295   Cardiac Enzymes: No results found for this basename: CKTOTAL, CKMB, CKMBINDEX, TROPONINI,  in the last 168 hours BNP (last 3 results)  Recent Labs  09/30/14 0520  PROBNP 8120.0*   CBG:  Recent Labs Lab 10/14/14 0602 10/14/14 1117 10/14/14 1619 10/14/14 2104 10/15/14 0523  GLUCAP 175* 160* 121* 128* 125*    Recent Results (from the past 240 hour(s))  URINE CULTURE  Status: None   Collection Time    10/13/14  2:52 PM      Result Value Ref Range Status   Specimen Description URINE, RANDOM   Final   Special Requests ADDED [id number]   Final   Culture  Setup Time     Final   Value: 10/13/2014 22:08     Performed at [facility] PENDING   Incomplete   Culture     Final   Value: Culture reincubated for better growth     Performed at [facility]   Report Status PENDING   Incomplete        Studies: No results found.      Scheduled Meds: . amiodarone  400 mg Oral Daily  . calcitRIOL  0.25 mcg Oral  BID  . calcium-vitamin D  1 tablet Oral BID  . cefTRIAXone (ROCEPHIN)  IV  1 g Intravenous Q24H  . collagenase   Topical TID  . febuxostat  80 mg Oral Daily  . fluconazole  100 mg Oral Daily  .  HYDROmorphone (DILAUDID) injection  2 mg Intravenous Daily  . insulin aspart  0-15 Units Subcutaneous TID WC  . insulin aspart  0-5 Units Subcutaneous QHS  . insulin aspart  2 Units Subcutaneous TID WC  . insulin glargine  22 Units Subcutaneous QHS  . iron polysaccharides  150 mg Oral BID  . levothyroxine  125 mcg Oral QAC breakfast  . linagliptin  5 mg Oral Daily  . metolazone  2.5 mg Oral QODAY  . multivitamin with minerals  1 tablet Oral Daily  . saccharomyces boulardii  250 mg Oral BID  . senna  2 tablet Oral BID  . silver nitrate applicators  1 application Topical Once  . sodium chloride  3 mL Intravenous Q12H  . torsemide  60 mg Oral BID  . 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   Obesity hypoventilation syndrome-    Cardiomyopathy, nonischemic- EF 30-35% June 2014   Type 2 diabetes mellitus   Chronic combined systolic and diastolic CHF   Long term (current) use of anticoagulants   CKD (chronic kidney disease) stage 4, GFR 15-29 ml/min   PAF- recurrent despite Amiodarone and multiple cardioversions   Mitral insufficiency, moderate to severe   Chronic massive bilat LE lymphadema   Fall at home- 3 falls this summer, sound orthostatic   Hypotension   Acute on chronic renal failure   Coarse tremors   DNR (do not resuscitate) discussion   Palliative care encounter   Weakness generalized    Time spent: 20 minutes.    Hosie Poisson, MD, [facility] Pager [phone]  If 7PM-7AM, please contact night-coverage [email or web address] Password [email or web address] 10/15/2014, 11:10 AM    LOS: 36 days

## 2014-10-15 NOTE — [facility] Notes (Signed)
Occupational Therapy Treatment Patient Details Name: [name] MRN: [id number] DOB: 1950/09/14 Today's Date: 10/15/2014    History of present illness 64 y.o. female  with history of fibromyalgia, hypothyroidism, solitary kidney, hypothyroidism, diabetes, nonischemic cardiomyopathy with ejection fraction of 35%, atrial fibrillation who presented to the emergency department with left-sided lower leg pain. She was admitted on 9/10 w/ recurrent Lower extremity cellulitis. PCCM asked to see in consult on 9/11 for persistent hypotension and worsening renal failure.  She had a previous admission for LE cellulitis in 07/2014 followed by [facility].  Pt had returned home with HHPT prior to this admission.    OT comments  Focus of session on B UE exercises with Level 1 and 2 theraband and bed level grooming.  Pt tolerating well.  Follow Up Recommendations  LTACH;SNF    Equipment Recommendations  None recommended by OT    Recommendations for Other Services      Precautions / Restrictions Precautions Precautions: Fall       Mobility   Balance    ADL Overall ADL's : Needs assistance/impaired     Grooming: Wash/dry hands;Wash/dry face;Oral care;Brushing hair;Bed level;Set up                                        Vision                     Perception     Praxis      Cognition   Behavior During Therapy: Anxious Overall Cognitive Status: Within Functional Limits for tasks assessed                       Extremity/Trunk Assessment               Exercises General Exercises - Upper Extremity Shoulder Flexion: Strengthening;Both;10 reps;Supine;Theraband Theraband Level (Shoulder Flexion): Level 1 (Yellow);Level 2 (Red) (level 1 on R, level 2 on L) Shoulder Extension: Strengthening;Both;10 reps;Supine;Theraband Theraband Level (Shoulder Extension): Level 2 (Red) Shoulder Horizontal ABduction: Strengthening;Both;10  reps;Supine;Theraband Theraband Level (Shoulder Horizontal Abduction): Level 1 (Yellow) Elbow Flexion: Strengthening;Both;10 reps;Supine;Theraband Theraband Level (Elbow Flexion): Level 2 (Red) Elbow Extension: Strengthening;Both;10 reps;Supine;Theraband Theraband Level (Elbow Extension): Level 2 (Red)    Shoulder Instructions       General Comments      Pertinent Vitals/ Pain       Pain Assessment: No/denies pain Faces Pain Scale: Hurts whole lot Pain Location: back of legs Pain Descriptors / Indicators: Burning;Constant;Jabbing Pain Intervention(s): Premedicated before session  Home Living                                          Prior Functioning/Environment              Frequency Min 2X/week     Progress Toward Goals  OT Goals(current goals can now be found in the care plan section)  Progress towards OT goals: Progressing toward goals  Acute Rehab OT Goals Time For Goal Achievement: 10/29/14  Plan Discharge plan remains appropriate    Co-evaluation                 End of Session     Activity Tolerance Patient tolerated treatment well   Patient Left in bed;with call bell/phone within reach  Nurse Communication          Time: 8453-6468 OT Time Calculation (min): 35 min  Charges: OT General Charges $OT Visit: 1 Procedure OT Treatments $Self Care/Home Management : 8-22 mins $Therapeutic Exercise: 8-22 mins  Malka So 10/15/2014, 11:51 AM [phone]

## 2014-10-16 LAB — BASIC METABOLIC PANEL
Anion gap: 11 (ref 5–15)
BUN: 94 mg/dL — ABNORMAL HIGH (ref 6–23)
CHLORIDE: 90 meq/L — AB (ref 96–112)
CO2: 32 mEq/L (ref 19–32)
Calcium: 7.8 mg/dL — ABNORMAL LOW (ref 8.4–10.5)
Creatinine, Ser: 2.56 mg/dL — ABNORMAL HIGH (ref 0.50–1.10)
GFR calc non Af Amer: 18 mL/min — ABNORMAL LOW (ref >90)
GFR, EST AFRICAN AMERICAN: 21 mL/min — AB (ref >90)
GLUCOSE: 98 mg/dL (ref 70–99)
POTASSIUM: 4.3 meq/L (ref 3.7–5.3)
Sodium: 133 mEq/L — ABNORMAL LOW (ref 137–147)

## 2014-10-16 LAB — URINE CULTURE: Colony Count: >=100000

## 2014-10-16 LAB — GLUCOSE, CAPILLARY
GLUCOSE-CAPILLARY: 87 mg/dL (ref 70–99)
Glucose-Capillary: 126 mg/dL — ABNORMAL HIGH (ref 70–99)
Glucose-Capillary: 146 mg/dL — ABNORMAL HIGH (ref 70–99)
Glucose-Capillary: 175 mg/dL — ABNORMAL HIGH (ref 70–99)

## 2014-10-16 LAB — PROTIME-INR
INR: 2.14 — ABNORMAL HIGH (ref 0.00–1.49)
Prothrombin Time: 24.1 seconds — ABNORMAL HIGH (ref 11.6–15.2)

## 2014-10-16 MED ORDER — WARFARIN SODIUM 4 MG PO TABS
4.0000 mg | ORAL_TABLET | Freq: Once | ORAL | Status: AC
  Administered 2014-10-16: 4 mg via ORAL
  Filled 2014-10-16: qty 1

## 2014-10-16 MED ORDER — SODIUM CHLORIDE 0.9 % IV BOLUS (SEPSIS)
250.0000 mL | Freq: Once | INTRAVENOUS | Status: AC
  Administered 2014-10-16: 250 mL via INTRAVENOUS

## 2014-10-16 MED ORDER — WARFARIN SODIUM 5 MG PO TABS
5.0000 mg | ORAL_TABLET | Freq: Once | ORAL | Status: DC
  Filled 2014-10-16: qty 1

## 2014-10-16 NOTE — [facility] Notes (Signed)
ANTICOAGULATION CONSULT NOTE - Follow Up Consult  Pharmacy Consult for Coumadin Indication: atrial fibrillation  Allergies  Allergen Reactions  . Ivp Dye [Iodinated Diagnostic Agents] Shortness Of Breath    Turn red, can't breathe  . Betadine [Povidone Iodine]   . Diphenhydramine Hcl Swelling    In hands and eyes  . Fish Allergy Nausea And Vomiting  . Iohexol      Desc: PT TURNS RED AND WHEEZING   . Penicillins Other (See Comments)    Resp arrest as child. Tolerates cephalosporins.  . Povidone-Iodine Other (See Comments)    Wheezing, turn red, can't breath, and blisters  . Promethazine Hcl Other (See Comments)    Low Blood Pressure  . Tape     Blisters, can use paper tape for short periods  . Clindamycin Hives and Rash    wheezing  . Iodine Rash  . Morphine Sulfate Nausea And Vomiting and Rash  . Primaxin [Imipenem] Rash    Patient Measurements: Height: 5' 6.93" (170 cm) Weight:  (unable to weigh) IBW/kg (Calculated) : 61.44 Heparin Dosing Weight:   Vital Signs: Temp: 97.9 F (36.6 C) (10/17 0539) Temp Source: Oral (10/17 0539) BP: 80/46 mmHg (10/17 0539) Pulse Rate: 115 (10/17 0539)  Labs:  Recent Labs  10/14/14 0415 10/14/14 1155 10/15/14 0405 10/16/14 0405  LABPROT 28.2*  --  25.1* 24.1*  INR 2.62*  --  2.26* 2.14*  CREATININE  --  2.27* 2.39* 2.56*    Estimated Creatinine Clearance: 31.2 ml/min (by C-G formula based on Cr of 2.56).   Medications:  Scheduled:  . amiodarone  400 mg Oral Daily  . calcitRIOL  0.25 mcg Oral BID  . calcium-vitamin D  1 tablet Oral BID  . cefTRIAXone (ROCEPHIN)  IV  1 g Intravenous Q24H  . collagenase   Topical TID  . febuxostat  80 mg Oral Daily  .  HYDROmorphone (DILAUDID) injection  2 mg Intravenous Daily  . insulin aspart  0-15 Units Subcutaneous TID WC  . insulin aspart  0-5 Units Subcutaneous QHS  . insulin aspart  2 Units Subcutaneous TID WC  . insulin glargine  22 Units Subcutaneous QHS  . iron  polysaccharides  150 mg Oral BID  . levothyroxine  125 mcg Oral QAC breakfast  . linagliptin  5 mg Oral Daily  . metolazone  2.5 mg Oral QODAY  . multivitamin with minerals  1 tablet Oral Daily  . saccharomyces boulardii  250 mg Oral BID  . senna  2 tablet Oral BID  . silver nitrate applicators  1 application Topical Once  . sodium chloride  3 mL Intravenous Q12H  . torsemide  60 mg Oral BID  . warfarin  5 mg Oral ONCE-1800  . Warfarin - Pharmacist Dosing Inpatient   Does not apply q1800    Assessessment: 38 yr of female taking coumadin for a.fib. INR 2.14. Home dose  Goal of Therapy:  INR 2-3    Plan:  Pt with INR 2.14 today. Has been very sensitive to smaller doses while in the hospital. Will try 4 mg today and f/u INR in the am.  Minta Balsam 10/16/2014,11:06 AM

## 2014-10-16 NOTE — [facility] Notes (Signed)
Physical Therapy Wound Treatment Patient Details  Name: [name] MRN: [id number] Date of Birth: [birth year]  Today's Date: 10/16/2014 Time: 6812-7517 Time Calculation (min): 68 min  Subjective  Subjective: "I hope you aren't heavy handed." Patient and Family Stated Goals: decr pain in wounds and heal without surgery Date of Onset:  (multiple areas exact date unknown) Prior Treatments: mepitel foam, various types of tape  Pain Score: Pain Score:  Pt premedicated  Wound Assessment  Pressure Ulcer 09/20/14 Deep Tissue Injury - Purple or maroon localized area of discolored intact skin or blood-filled blister due to damage of underlying soft tissue from pressure and/or shear. (Active)  Dressing Type Gauze (Comment);ABD;Mesh briefs;Other (Comment) 10/16/2014 10:57 AM  Dressing Changed 10/16/2014 10:57 AM  Dressing Change Frequency Other (Comment) 10/16/2014 10:57 AM  State of Healing Eschar 10/16/2014 10:57 AM  Site / Wound Assessment Yellow;Painful;Granulation tissue 10/16/2014 10:57 AM  % Wound base Red or Granulating 5% 10/16/2014 10:57 AM  % Wound base Yellow 95% 10/16/2014 10:57 AM  % Wound base Black 0% 10/16/2014 10:57 AM  % Wound base Other (Comment) 0% 10/16/2014 10:57 AM  Peri-wound Assessment Pink;Intact 10/16/2014 10:57 AM  Wound Length (cm) 4.5 cm 10/14/2014  4:06 PM  Wound Width (cm) 5.6 cm 10/14/2014  4:06 PM  Wound Depth (cm) 2 cm 10/14/2014  4:06 PM  Margins Unattached edges (unapproximated) 10/16/2014 10:57 AM  Drainage Amount Moderate 10/16/2014 10:57 AM  Drainage Description Serosanguineous 10/16/2014 10:57 AM  Treatment Debridement (Selective);Hydrotherapy (Pulse lavage) 10/16/2014 10:57 AM     Wound 06/02/13 Abrasion(s) Leg Left (Active)  Dressing Type ABD;Gauze (Comment);Mesh briefs 10/16/2014  7:50 AM  Dressing Changed Changed 10/13/2014 11:45 PM  Dressing Status Clean;Dry;Intact 10/15/2014  3:28 PM  Dressing Change Frequency Daily 10/15/2014  3:28 PM   Site / Wound Assessment Red 10/15/2014  3:28 PM  % Wound base Red or Granulating 100% 10/15/2014  3:28 PM  % Wound base Yellow 0% 10/15/2014  3:28 PM  % Wound base Black 0% 10/15/2014  3:28 PM  % Wound base Other (Comment) 0% 10/15/2014  3:28 PM  Peri-wound Assessment Intact;Edema;Erythema (blanchable) 10/15/2014  3:28 PM  Margins Unattached edges (unapproximated) 10/15/2014  3:28 PM  Closure None 10/15/2014  3:28 PM  Drainage Amount Moderate 10/15/2014  3:28 PM  Drainage Description Serous 10/15/2014  3:28 PM  Non-staged Wound Description Full thickness 10/15/2014  3:28 PM  Treatment Hydrotherapy (Pulse lavage) 10/15/2014  3:28 PM     Wound / Incision (Open or Dehisced) 09/18/14 Other (Comment) Hip Right Black eschar tissue surrounded by pink tissue (Active)  Dressing Type Gauze (Comment);ABD;Mesh briefs 10/16/2014 10:57 AM  Dressing Changed Changed 10/16/2014 10:57 AM  Dressing Status Clean;Dry;Intact 10/16/2014 10:57 AM  Dressing Change Frequency Other (Comment) 10/16/2014 10:57 AM  Site / Wound Assessment Yellow;Red;Painful 10/16/2014 10:57 AM  % Wound base Red or Granulating 5% 10/16/2014 10:57 AM  % Wound base Yellow 95% 10/16/2014 10:57 AM  % Wound base Black 0% 10/16/2014 10:57 AM  % Wound base Other (Comment) 0% 10/16/2014 10:57 AM  Peri-wound Assessment Edema;Pink;Erythema (non-blanchable);Induration 10/16/2014 10:57 AM  Wound Length (cm) 9.4 cm 10/15/2014  3:28 PM  Wound Width (cm) 8 cm 10/15/2014  3:28 PM  Wound Depth (cm) 1.5 cm 10/15/2014  3:28 PM  Margins Unattached edges (unapproximated) 10/16/2014 10:57 AM  Closure None 10/16/2014 10:57 AM  Drainage Amount Moderate 10/16/2014 10:57 AM  Drainage Description Serosanguineous;Odor 10/16/2014 10:57 AM  Non-staged Wound Description Full thickness 10/16/2014 10:57 AM  Treatment Debridement (  Selective);Hydrotherapy (Pulse lavage) 10/16/2014 10:57 AM   Hydrotherapy Pulsed lavage therapy - wound location: Rt lateral hip,  Lt lateral thigh, Lt proximal-posteriomedial thigh Pulsed Lavage with Suction (psi): 8 psi Pulsed Lavage with Suction - Normal Saline Used: 2000 mL Pulsed Lavage Tip: Tip with splash shield Selective Debridement Selective Debridement - Location: Rt lateral hip, Lt lateral thigh, Lt proximal-posteriomedial thigh Selective Debridement - Tools Used: Scissors;Scalpel;Forceps Selective Debridement - Tissue Removed: yellow/brown eschar, yellow slough, fatty tissue   Wound Assessment and Plan  Wound Therapy - Assess/Plan/Recommendations Wound Therapy - Clinical Statement: Pt making very slow progress with hydrotherapy. Wound Therapy - Functional Problem List: painful wounds limiting her mobility tolerance Factors Delaying/Impairing Wound Healing: Diabetes Mellitus;Infection - systemic/local;Immobility;Multiple medical problems;Vascular compromise Hydrotherapy Plan: Debridement;Dressing change;Patient/family education;Pulsatile lavage with suction Wound Therapy - Frequency: 6X / week Wound Therapy - Follow Up Recommendations: Skilled nursing facility Wound Plan: see above  Wound Therapy Goals- Improve the function of patient's integumentary system by progressing the wound(s) through the phases of wound healing (inflammation - proliferation - remodeling) by: Decrease Necrotic Tissue to: <75% Decrease Necrotic Tissue - Progress: Progressing toward goal Increase Granulation Tissue to: >25% Increase Granulation Tissue - Progress: Progressing toward goal Improve Drainage Characteristics: Min Improve Drainage Characteristics - Progress: Progressing toward goal  Goals will be updated until maximal potential achieved or discharge criteria met.  Discharge criteria: when goals achieved, discharge from hospital, MD decision/surgical intervention, no progress towards goals, refusal/missing three consecutive treatments without notification or medical reason.  GP     Nejla Reasor 10/16/2014, 11:07  AM  Suanne Marker PT [phone]

## 2014-10-16 NOTE — Progress Notes (Signed)
PROGRESS NOTE    [name] ZLD:[id number] DOB: 1950/04/12 DOA: 09/09/2014 PCP: Elby Showers, MD  HPI/Brief narrative 64 y.o. female with history of Morbid obesity, CKD 4, fibromyalgia, hypothyroidism, solitary kidney, hypothyroidism, diabetes, nonischemic cardiomyopathy with ejection fraction of 35%, atrial fibrillation who was admitted on 9/10 w/ recurrent Lower extremity cellulitis, immediately went into Septic shock- was in ICU on pressors until 9/14. Subsequently transferred to the Floor. Has completed antibiotics. Undergoing wound care/hydrotherapy per PT under [facility] supervision. Cardiology had signed off-family requested reconsult. Palliative care team consulted for goals of care. Discharged disposition pending-case management working on it.   Assessment/Plan:  1. Septic shock: Due to bilateral leg cellulitis. Required vasopressors in ICU. Resolved.  2. Multiple bilateral lower extremity wounds: Completed antibiotic course. Surgery & WOC followup appreciated. Continue hydrotherapy with PT. She will need OP follow up with plastic surgery. As per discussion with case management, if no SNF available to manage wound care, may be eligible for LTAC. Significant pain during wound care- much better controlled with Dilaudid 2 mg IV x1 prior to dressing changes. Management per surgery. 3. Acute on chronic combined systolic and diastolic CHF/anasarca: Difficult to assess volume status despite large volume diuresis. Weights have been inaccurate and not regularly recorded. Cardiology switched her to oral diuretics-torsemide and metolazone. Marked hypoalbuminemia also contributing to anasarca. Swelling of bilateral legs probably from chronic venous stasis. Cardiology signed off. Patient continues to improve. Please call the [facility] office when she is ready for discharge and cardiology will arrange a f/u appt with Dr Sallyanne Kuster or his PA/NP. Please call if cardiac issues arise.Stable. Placed back on  telemetry on 10/12 due to concern for arrhythmias while on fluconazole. 4. A. Fib/s/p PPM: Controlled ventricular rate. Coumadin per pharmacy.  5. Hypotension: resolved 6. 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 Continue Lantus, NovoLog mealtime and SSI. 7. Hypothyroid: Continue levothyroxine. Recent TSH 2 on 05/29/1511 8. History of gout: Stable  9. History of OHS/morbid obesity: Nightly CPAP 10. Tremors: Gabapentin discontinued. Seems to have resolved.  11. Acute on stage IV chronic kidney disease: No baseline creatinine probably in the low 2 range. Creatinine gradually improving. Follow BMP periodically. 12. RUE swelling: Dopplers negative for DVT. Likely from anasarca. 13. Anemia: Secondary to chronic kidney disease, acute illness and iron deficiency: Stable 14. Hypokalemia: Replaced 15. Fecal impaction/Constipation: Significant relief after digital disimpaction and enema 10/13. She wishes to continue Colace and senna but declines MiraLAX. Likely multifactorial from immobility, pain medications, poor diet and iron supplements. 16. Fungal rash (of back and back of arms): Seems extensive and less likely to improve from topical nystatin. Brief course of by mouth fluconazole-DC ed after 5 days. 17.  UTI: urine cultures ordered and started on rocephin.  18. Prolonged QTC: Please see discussion in #16. Try to keep potassium greater than >4 and magnesium >2. Her QTC is improving.   Code Status: Full Family Communication: Discussed with patient's sister [name] at bedside. Disposition Plan: LTAC vs SNF when bed available-approaching medical stability for discharge. Discussed at length with patient's sister [name] at bedside on10/17  Consultants:  General surgery   Cardiology  Critical care medicine-signed off  Nephrology-signed off  [place] - pending-discussed with the palliative care team and will try to arrange meeting  today.  Procedures:  Central venous catheter insertion procedure on 09/10/2014 >Will attempt to try for a peripheral IV line, then DC central line.  Hydrotherapy   Antibiotics:  Completed  all antibiotics 9/23   Subjective:  Still frustrated about not being able to switch the insurances. Refused to allow [place] to change the foley catheter. Requested to do it on Monday.   Objective: Filed Vitals:   10/15/14 0734 10/15/14 2123 10/16/14 0539 10/16/14 1536  BP: 88/49 92/48 80/46  95/48  Pulse:  99 115 100  Temp:  98.2 F (36.8 C) 97.9 F (36.6 C) 98.2 F (36.8 C)  TempSrc:  Oral Oral Oral  Resp:  18 18 18   Height:      Weight:      SpO2:  92% 92% 93%    Intake/Output Summary (Last 24 hours) at 10/16/14 1820 Last data filed at 10/16/14 1219  Gross per 24 hour  Intake    220 ml  Output    575 ml  Net   -355 ml   Filed Weights   10/08/14 0423  Weight: 136.533 kg (301 lb)     Exam:  General exam: Moderately built and morbidly obese female lying comfortably propped up in bed. Appears pleasant and cheerful this morning. Respiratory system: Clear. No increased work of breathing. Cardiovascular system: S1 & S2 heard, RRR. No JVD, murmurs, gallops, clicks. 2+ pitting bilateral leg edema/anasarca-leg edema appears chronic. Telemetry: A. fib with ventricular rate in the 90s. Gastrointestinal system: Abdomen is nondistended, soft and nontender. Normal bowel sounds heard. Central nervous system: Alert and oriented. No focal neurological deficits. Extremities: Symmetric 5 x 5 power.Multiple bilateral lower extremity wounds-details as per daily PT note. Chronic and patchy mild bilateral lower leg faint erythema-stable. Skin: Extensive patchy redness of back and back of bilateral arms suggestive of fungal infection- seem much better.  Data Reviewed: Basic Metabolic Panel:  Recent Labs Lab 10/12/14 0510 10/12/14 1640 10/13/14 0500 10/14/14 1155 10/15/14 0405 10/16/14 0405  NA  139  --  136* 135* 135* 133*  K 3.5*  --  3.7 3.6* 4.4 4.3  CL 93*  --  92* 90* 91* 90*  CO2 35*  --  34* 33* 34* 32  GLUCOSE 98  --  137* 138* 118* 98  BUN 90*  --  92* 93* 96* 94*  CREATININE 2.00*  --  2.09* 2.27* 2.39* 2.56*  CALCIUM 8.0*  --  7.9* 7.8* 7.8* 7.8*  MG  --  1.8  --  1.8  --   --    Liver Function Tests:  Recent Labs Lab 10/12/14 0510  AST 32  ALT 17  ALKPHOS 149*  BILITOT 1.1  PROT 6.2  ALBUMIN 1.4*   No results found for this basename: LIPASE, AMYLASE,  in the last 168 hours No results found for this basename: AMMONIA,  in the last 168 hours CBC:  Recent Labs Lab 10/12/14 0510  WBC 10.8*  HGB 8.9*  HCT 28.0*  MCV 100.4*  PLT 295   Cardiac Enzymes: No results found for this basename: CKTOTAL, CKMB, CKMBINDEX, TROPONINI,  in the last 168 hours BNP (last 3 results)  Recent Labs  09/30/14 0520  PROBNP 8120.0*   CBG:  Recent Labs Lab 10/15/14 1630 10/15/14 2121 10/16/14 0614 10/16/14 1114 10/16/14 1712  GLUCAP 98 104* 126* 87 146*    Recent Results (from the past 240 hour(s))  URINE CULTURE     Status: None   Collection Time    10/13/14  2:52 PM      Result Value Ref Range Status   Specimen Description URINE, RANDOM   Final   Special Requests ADDED [id number]  Final   Culture  Setup Time     Final   Value: 10/13/2014 22:08     Performed at [facility]     Final   Value: >=100,000 COLONIES/ML     Performed at [facility]   Culture     Final   Value: KLEBSIELLA PNEUMONIAE     Performed at [facility]   Report Status 10/16/2014 FINAL   Final   Organism ID, Bacteria KLEBSIELLA PNEUMONIAE   Final        Studies: No results found.      Scheduled Meds: . amiodarone  400 mg Oral Daily  . calcitRIOL  0.25 mcg Oral BID  . calcium-vitamin D  1 tablet Oral BID  . cefTRIAXone (ROCEPHIN)  IV  1 g Intravenous Q24H  . collagenase   Topical TID  . febuxostat  80 mg Oral Daily  .   HYDROmorphone (DILAUDID) injection  2 mg Intravenous Daily  . insulin aspart  0-15 Units Subcutaneous TID WC  . insulin aspart  0-5 Units Subcutaneous QHS  . insulin aspart  2 Units Subcutaneous TID WC  . insulin glargine  22 Units Subcutaneous QHS  . iron polysaccharides  150 mg Oral BID  . levothyroxine  125 mcg Oral QAC breakfast  . linagliptin  5 mg Oral Daily  . metolazone  2.5 mg Oral QODAY  . multivitamin with minerals  1 tablet Oral Daily  . saccharomyces boulardii  250 mg Oral BID  . senna  2 tablet Oral BID  . silver nitrate applicators  1 application Topical Once  . sodium chloride  3 mL Intravenous Q12H  . torsemide  60 mg Oral BID  . 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   Obesity hypoventilation syndrome-    Cardiomyopathy, nonischemic- EF 30-35% June 2014   Type 2 diabetes mellitus   Chronic combined systolic and diastolic CHF   Long term (current) use of anticoagulants   CKD (chronic kidney disease) stage 4, GFR 15-29 ml/min   PAF- recurrent despite Amiodarone and multiple cardioversions   Mitral insufficiency, moderate to severe   Chronic massive bilat LE lymphadema   Fall at home- 3 falls this summer, sound orthostatic   Hypotension   Acute on chronic renal failure   Coarse tremors   DNR (do not resuscitate) discussion   Palliative care encounter   Weakness generalized   UTI (urinary tract infection)    Time spent: 20 minutes.    Hosie Poisson, MD, [facility] Pager [phone]  If 7PM-7AM, please contact night-coverage [email or web address] Password [email or web address] 10/16/2014, 6:20 PM    LOS: 37 days

## 2014-10-17 LAB — BASIC METABOLIC PANEL
Anion gap: 12 (ref 5–15)
BUN: 96 mg/dL — AB (ref 6–23)
CHLORIDE: 89 meq/L — AB (ref 96–112)
CO2: 32 mEq/L (ref 19–32)
CREATININE: 2.72 mg/dL — AB (ref 0.50–1.10)
Calcium: 7.8 mg/dL — ABNORMAL LOW (ref 8.4–10.5)
GFR calc non Af Amer: 17 mL/min — ABNORMAL LOW (ref >90)
GFR, EST AFRICAN AMERICAN: 20 mL/min — AB (ref >90)
GLUCOSE: 150 mg/dL — AB (ref 70–99)
Potassium: 4 mEq/L (ref 3.7–5.3)
Sodium: 133 mEq/L — ABNORMAL LOW (ref 137–147)

## 2014-10-17 LAB — GLUCOSE, CAPILLARY
GLUCOSE-CAPILLARY: 149 mg/dL — AB (ref 70–99)
Glucose-Capillary: 150 mg/dL — ABNORMAL HIGH (ref 70–99)

## 2014-10-17 LAB — PROTIME-INR
INR: 2.03 — ABNORMAL HIGH (ref 0.00–1.49)
PROTHROMBIN TIME: 23.2 s — AB (ref 11.6–15.2)

## 2014-10-17 MED ORDER — WARFARIN SODIUM 5 MG PO TABS
5.0000 mg | ORAL_TABLET | Freq: Once | ORAL | Status: AC
  Administered 2014-10-17: 5 mg via ORAL
  Filled 2014-10-17: qty 1

## 2014-10-17 NOTE — Progress Notes (Signed)
RN changed pt dressings. Pt refused to turn and have pads changed that were soiled. RN educated pt on the importance of changing pads. Pt verbalized understanding but pt continued to refused. Pt currently resting in bed with call bell within reach. Will continue to monitor and attempt to change pads in an hour.   Brennen Gardiner R

## 2014-10-17 NOTE — Progress Notes (Signed)
ANTICOAGULATION CONSULT NOTE - Follow Up Consult  Pharmacy Consult for Warfarin Indication: atrial fibrillation  Allergies  Allergen Reactions  . Ivp Dye [Iodinated Diagnostic Agents] Shortness Of Breath    Turn red, can't breathe  . Betadine [Povidone Iodine]   . Diphenhydramine Hcl Swelling    In hands and eyes  . Fish Allergy Nausea And Vomiting  . Iohexol      Desc: PT TURNS RED AND WHEEZING   . Penicillins Other (See Comments)    Resp arrest as child. Tolerates cephalosporins.  . Povidone-Iodine Other (See Comments)    Wheezing, turn red, can't breath, and blisters  . Promethazine Hcl Other (See Comments)    Low Blood Pressure  . Tape     Blisters, can use paper tape for short periods  . Clindamycin Hives and Rash    wheezing  . Iodine Rash  . Morphine Sulfate Nausea And Vomiting and Rash  . Primaxin [Imipenem] Rash    Patient Measurements: Height: 5' 6.93" (170 cm) Weight:  (unable to obtain) IBW/kg (Calculated) : 61.44  Vital Signs: Temp: 98.2 F (36.8 C) (10/18 0840) Temp Source: Oral (10/18 0840) BP: 105/57 mmHg (10/18 0840) Pulse Rate: 88 (10/18 0840)  Labs:  Recent Labs  10/15/14 0405 10/16/14 0405 10/17/14 0415  LABPROT 25.1* 24.1* 23.2*  INR 2.26* 2.14* 2.03*  CREATININE 2.39* 2.56* 2.72*    Estimated Creatinine Clearance: 29.4 ml/min (by C-G formula based on Cr of 2.72).   Assessment: [name] who continues on warfarin for hx Afib with a therapeutic INR this morning (INR 2.03 << 2.14, goal of 2-3). No CBC since 10/13 - stable at that time.   Goal of Therapy:  INR 2-3  Plan:  1. Warfarin 5 mg x 1 dose today 2. Will obtain CBC on 10/19 AM to evaluate for bleeding 3. Will continue to monitor for any signs/symptoms of bleeding and will follow up with PT/INR in the a.m.   Alycia Rossetti, PharmD, BCPS Clinical Pharmacist Pager: [phone] 10/17/2014 6:52 PM

## 2014-10-17 NOTE — Progress Notes (Signed)
Pt requested that her dressing changes and bed not be changed during the night. Pt states "I want to rest, I have a busy day tomorrow". Will continue to monitor.   [name], Yisroel Ramming, RN

## 2014-10-17 NOTE — Progress Notes (Signed)
PROGRESS NOTE    [name] WPY:[id number] DOB: 03/04/1950 DOA: 09/09/2014 PCP: Elby Showers, MD  HPI/Brief narrative 64 y.o. female with history of Morbid obesity, CKD 4, fibromyalgia, hypothyroidism, solitary kidney, hypothyroidism, diabetes, nonischemic cardiomyopathy with ejection fraction of 35%, atrial fibrillation who was admitted on 9/10 w/ recurrent Lower extremity cellulitis, immediately went into Septic shock- was in ICU on pressors until 9/14. Subsequently transferred to the Floor. Has completed antibiotics. Undergoing wound care/hydrotherapy per PT under [facility] supervision. Cardiology had signed off-family requested reconsult. Palliative care team consulted for goals of care.patient is trying to change her insurance so she will be eligible for LTAC. Discharged disposition pending-case management working on it.   Assessment/Plan:  1. Septic shock: Due to bilateral leg cellulitis. Required vasopressors in ICU. Resolved.  2. Multiple bilateral lower extremity wounds: Completed antibiotic course. Surgery & WOC followup appreciated. Continue hydrotherapy with PT. She will need OP follow up with plastic surgery. As per discussion with case management, if no SNF available to manage wound care, may be eligible for LTAC. Significant pain during wound care- much better controlled with Dilaudid 2 mg IV x1 prior to dressing changes. Management per surgery. 3. Acute on chronic combined systolic and diastolic CHF/anasarca: Difficult to assess volume status despite large volume diuresis. Weights have been inaccurate and not regularly recorded. Cardiology switched her to oral diuretics-torsemide and metolazone. We have held metolazone for increasing creatinine. Marked hypoalbuminemia also contributing to anasarca. Swelling of bilateral legs probably from chronic venous stasis. Cardiology signed off. Patient continues to improve. Please call the [facility] office when she is ready for discharge and  cardiology will arrange a f/u appt with Dr Sallyanne Kuster or his PA/NP. Please call if cardiac issues arise.Stable. Placed back on telemetry on 10/12 due to concern for arrhythmias while on fluconazole.  4. A. Fib/s/p PPM: Controlled ventricular rate. Coumadin per pharmacy.  5. Hypotension: resolved 6. 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 Continue Lantus, NovoLog mealtime and SSI. 7. Hypothyroid: Continue levothyroxine. Recent TSH 2 on 05/29/1511 8. History of gout: Stable  9. History of OHS/morbid obesity: Nightly CPAP 10. Tremors: Gabapentin discontinued. Seems to have resolved.  11. Acute on stage IV chronic kidney disease: No baseline creatinine probably in the low 2 range. Creatinine gradually improving. Follow BMP periodically. 12. RUE swelling: Dopplers negative for DVT. Likely from anasarca. 13. Anemia: Secondary to chronic kidney disease, acute illness and iron deficiency: Stable 14. Hypokalemia: Replaced. 15. Fecal impaction/Constipation: Significant relief after digital disimpaction and enema 10/13. She wishes to continue Colace and senna but declines MiraLAX. Likely multifactorial from immobility, pain medications, poor diet and iron supplements. 16. Fungal rash (of back and back of arms): Seems extensive and less likely to improve from topical nystatin. Brief course of by mouth fluconazole-DC ed after 5 days. 17.  UTI: urine cultures ordered and started on rocephin. Urine cultures show klebsiella sensitive for rocephin. 18. Prolonged QTC: Please see discussion in #16. Try to keep potassium greater than >4 and magnesium >2. Her QTC is improving.   Code Status: Full Family Communication: Discussed with patient's sister [name] at bedside. Disposition Plan: LTAC vs SNF when bed available-approaching medical stability for discharge. Discussed at length with patient's sister [name] at bedside on10/18  Consultants:  General surgery    Cardiology  Critical care medicine-signed off  Nephrology-signed off  [place] - pending-discussed with the palliative care team and will try to arrange meeting today.  Procedures:  Central venous catheter insertion procedure on 09/10/2014 >Will attempt to try for a peripheral IV line, then DC central line.  Hydrotherapy   Antibiotics:  Completed all antibiotics 9/23   Subjective:  Still frustrated about not being able to switch the insurances. Refused to allow [place] to change the foley catheter. Requested to do it on Monday.   Objective: Filed Vitals:   10/16/14 1536 10/16/14 2105 10/17/14 0522 10/17/14 0840  BP: 95/48 100/55 86/47 105/57  Pulse: 100 103 86 88  Temp: 98.2 F (36.8 C) 98.1 F (36.7 C) 97.9 F (36.6 C) 98.2 F (36.8 C)  TempSrc: Oral Oral Oral Oral  Resp: 18 18 18 18   Height:      Weight:      SpO2: 93% 94% 95% 92%    Intake/Output Summary (Last 24 hours) at 10/17/14 1749 Last data filed at 10/17/14 0800  Gross per 24 hour  Intake      0 ml  Output   2225 ml  Net  -2225 ml   Filed Weights   10/08/14 0423  Weight: 136.533 kg (301 lb)     Exam:  General exam: Moderately built and morbidly obese female lying comfortably propped up in bed. Appears pleasant and cheerful this morning. Respiratory system: Clear. No increased work of breathing. Cardiovascular system: S1 & S2 heard, RRR. No JVD, murmurs, gallops, clicks. 2+ pitting bilateral leg edema/anasarca-leg edema appears chronic. Telemetry: A. fib with ventricular rate in the 90s. Gastrointestinal system: Abdomen is nondistended, soft and nontender. Normal bowel sounds heard. Central nervous system: Alert and oriented. No focal neurological deficits. Extremities: Symmetric 5 x 5 power.Multiple bilateral lower extremity wounds-details as per daily PT note. Chronic and patchy mild bilateral lower leg faint erythema-stable. Skin: Extensive patchy redness of back and back of  bilateral arms suggestive of fungal infection- seem much better.  Data Reviewed: Basic Metabolic Panel:  Recent Labs Lab 10/12/14 1640 10/13/14 0500 10/14/14 1155 10/15/14 0405 10/16/14 0405 10/17/14 0415  NA  --  136* 135* 135* 133* 133*  K  --  3.7 3.6* 4.4 4.3 4.0  CL  --  92* 90* 91* 90* 89*  CO2  --  34* 33* 34* 32 32  GLUCOSE  --  137* 138* 118* 98 150*  BUN  --  92* 93* 96* 94* 96*  CREATININE  --  2.09* 2.27* 2.39* 2.56* 2.72*  CALCIUM  --  7.9* 7.8* 7.8* 7.8* 7.8*  MG 1.8  --  1.8  --   --   --    Liver Function Tests:  Recent Labs Lab 10/12/14 0510  AST 32  ALT 17  ALKPHOS 149*  BILITOT 1.1  PROT 6.2  ALBUMIN 1.4*   No results found for this basename: LIPASE, AMYLASE,  in the last 168 hours No results found for this basename: AMMONIA,  in the last 168 hours CBC:  Recent Labs Lab 10/12/14 0510  WBC 10.8*  HGB 8.9*  HCT 28.0*  MCV 100.4*  PLT 295   Cardiac Enzymes: No results found for this basename: CKTOTAL, CKMB, CKMBINDEX, TROPONINI,  in the last 168 hours BNP (last 3 results)  Recent Labs  09/30/14 0520  PROBNP 8120.0*   CBG:  Recent Labs Lab 10/16/14 1114 10/16/14 1712 10/16/14 2101 10/17/14 0650 10/17/14 1134  GLUCAP 87 146* 175* 150* 149*    Recent Results (from the past 240 hour(s))  URINE CULTURE     Status: None   Collection Time    10/13/14  2:52 PM      Result Value Ref Range Status   Specimen Description URINE, RANDOM   Final   Special Requests ADDED [id number]   Final   Culture  Setup Time     Final   Value: 10/13/2014 22:08     Performed at [place]     Final   Value: >=100,000 COLONIES/ML     Performed at [facility]   Culture     Final   Value: KLEBSIELLA PNEUMONIAE     Performed at [facility]   Report Status 10/16/2014 FINAL   Final   Organism ID, Bacteria KLEBSIELLA PNEUMONIAE   Final        Studies: No results found.      Scheduled Meds: .  amiodarone  400 mg Oral Daily  . calcitRIOL  0.25 mcg Oral BID  . calcium-vitamin D  1 tablet Oral BID  . cefTRIAXone (ROCEPHIN)  IV  1 g Intravenous Q24H  . collagenase   Topical TID  . febuxostat  80 mg Oral Daily  .  HYDROmorphone (DILAUDID) injection  2 mg Intravenous Daily  . insulin aspart  0-15 Units Subcutaneous TID WC  . insulin aspart  0-5 Units Subcutaneous QHS  . insulin aspart  2 Units Subcutaneous TID WC  . insulin glargine  22 Units Subcutaneous QHS  . iron polysaccharides  150 mg Oral BID  . levothyroxine  125 mcg Oral QAC breakfast  . linagliptin  5 mg Oral Daily  . multivitamin with minerals  1 tablet Oral Daily  . saccharomyces boulardii  250 mg Oral BID  . senna  2 tablet Oral BID  . silver nitrate applicators  1 application Topical Once  . sodium chloride  3 mL Intravenous Q12H  . torsemide  60 mg Oral BID  . 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   Obesity hypoventilation syndrome-    Cardiomyopathy, nonischemic- EF 30-35% June 2014   Type 2 diabetes mellitus   Chronic combined systolic and diastolic CHF   Long term (current) use of anticoagulants   CKD (chronic kidney disease) stage 4, GFR 15-29 ml/min   PAF- recurrent despite Amiodarone and multiple cardioversions   Mitral insufficiency, moderate to severe   Chronic massive bilat LE lymphadema   Fall at home- 3 falls this summer, sound orthostatic   Hypotension   Acute on chronic renal failure   Coarse tremors   DNR (do not resuscitate) discussion   Palliative care encounter   Weakness generalized   UTI (urinary tract infection)    Time spent: 20 minutes.    Hosie Poisson, MD, [facility] Pager [phone]  If 7PM-7AM, please contact night-coverage [email or web address] Password [email or web address] 10/17/2014, 5:49 PM     LOS: 38 days

## 2014-10-18 DIAGNOSIS — Z87898 Personal history of other specified conditions: Secondary | ICD-10-CM

## 2014-10-18 LAB — PROTIME-INR
INR: 2.06 — ABNORMAL HIGH (ref 0.00–1.49)
Prothrombin Time: 23.3 seconds — ABNORMAL HIGH (ref 11.6–15.2)

## 2014-10-18 LAB — BASIC METABOLIC PANEL
Anion gap: 12 (ref 5–15)
BUN: 98 mg/dL — ABNORMAL HIGH (ref 6–23)
CHLORIDE: 88 meq/L — AB (ref 96–112)
CO2: 34 mEq/L — ABNORMAL HIGH (ref 19–32)
Calcium: 7.7 mg/dL — ABNORMAL LOW (ref 8.4–10.5)
Creatinine, Ser: 2.63 mg/dL — ABNORMAL HIGH (ref 0.50–1.10)
GFR calc Af Amer: 21 mL/min — ABNORMAL LOW (ref >90)
GFR, EST NON AFRICAN AMERICAN: 18 mL/min — AB (ref >90)
GLUCOSE: 125 mg/dL — AB (ref 70–99)
Potassium: 3.3 mEq/L — ABNORMAL LOW (ref 3.7–5.3)
Sodium: 134 mEq/L — ABNORMAL LOW (ref 137–147)

## 2014-10-18 LAB — CBC
HEMATOCRIT: 26 % — AB (ref 36.0–46.0)
HEMOGLOBIN: 8.4 g/dL — AB (ref 12.0–15.0)
MCH: 31.6 pg (ref 26.0–34.0)
MCHC: 32.3 g/dL (ref 30.0–36.0)
MCV: 97.7 fL (ref 78.0–100.0)
Platelets: 278 10*3/uL (ref 150–400)
RBC: 2.66 MIL/uL — AB (ref 3.87–5.11)
RDW: 18.7 % — ABNORMAL HIGH (ref 11.5–15.5)
WBC: 11.9 10*3/uL — ABNORMAL HIGH (ref 4.0–10.5)

## 2014-10-18 LAB — GLUCOSE, CAPILLARY
GLUCOSE-CAPILLARY: 108 mg/dL — AB (ref 70–99)
GLUCOSE-CAPILLARY: 144 mg/dL — AB (ref 70–99)
Glucose-Capillary: 196 mg/dL — ABNORMAL HIGH (ref 70–99)
Glucose-Capillary: 136 mg/dL — ABNORMAL HIGH (ref 70–99)

## 2014-10-18 LAB — MRSA PCR SCREENING: MRSA by PCR: NEGATIVE

## 2014-10-18 MED ORDER — WARFARIN SODIUM 2.5 MG PO TABS
2.5000 mg | ORAL_TABLET | Freq: Once | ORAL | Status: AC
Start: 2014-10-18 — End: 2014-10-18
  Administered 2014-10-18: 2.5 mg via ORAL
  Filled 2014-10-18: qty 1

## 2014-10-18 MED ORDER — POTASSIUM CHLORIDE CRYS ER 20 MEQ PO TBCR
40.0000 meq | EXTENDED_RELEASE_TABLET | Freq: Once | ORAL | Status: AC
  Administered 2014-10-18: 40 meq via ORAL

## 2014-10-18 MED ORDER — WARFARIN SODIUM 5 MG PO TABS
5.0000 mg | ORAL_TABLET | Freq: Once | ORAL | Status: DC
  Filled 2014-10-18: qty 1

## 2014-10-18 NOTE — Progress Notes (Signed)
Spoke with pt about her CPAP, and she still states that she doesn't feel like wearing it. Explained to let me know if she changes her mind.

## 2014-10-18 NOTE — Progress Notes (Addendum)
ANTICOAGULATION CONSULT NOTE - Follow Up Consult  Pharmacy Consult for Warfarin Indication: atrial fibrillation  Allergies  Allergen Reactions  . Ivp Dye [Iodinated Diagnostic Agents] Shortness Of Breath    Turn red, can't breathe  . Betadine [Povidone Iodine]   . Diphenhydramine Hcl Swelling    In hands and eyes  . Fish Allergy Nausea And Vomiting  . Iohexol      Desc: PT TURNS RED AND WHEEZING   . Penicillins Other (See Comments)    Resp arrest as child. Tolerates cephalosporins.  . Povidone-Iodine Other (See Comments)    Wheezing, turn red, can't breath, and blisters  . Promethazine Hcl Other (See Comments)    Low Blood Pressure  . Tape     Blisters, can use paper tape for short periods  . Clindamycin Hives and Rash    wheezing  . Iodine Rash  . Morphine Sulfate Nausea And Vomiting and Rash  . Primaxin [Imipenem] Rash    Patient Measurements: Height: 5' 6.93" (170 cm) Weight:  (unable to obtain) IBW/kg (Calculated) : 61.44  Vital Signs: Temp: 97.7 F (36.5 C) (10/19 0647) Temp Source: Oral (10/19 0647) BP: 96/57 mmHg (10/19 0647) Pulse Rate: 100 (10/19 0647)  Labs:  Recent Labs  10/16/14 0405 10/17/14 0415 10/18/14 0354  HGB  --   --  8.4*  HCT  --   --  26.0*  PLT  --   --  278  LABPROT 24.1* 23.2* 23.3*  INR 2.14* 2.03* 2.06*  CREATININE 2.56* 2.72* 2.63*    Estimated Creatinine Clearance: 30.4 ml/min (by C-G formula based on Cr of 2.63).   Assessment: 71 YOF who continues on warfarin for hx Afib with a therapeutic INR this morning (INR 2.06 with goal of 2-3). hgb 8.4, plt 278, stable. She is off fluconazole, continued on amiodarone (dose is lower than PTA)   PTA dose 5 mg on TTSS, 2.5 mg on MWF.  Goal of Therapy:  INR 2-3  Plan:  1. Warfarin 2.5 mg x 1 dose today 2. Will continue to monitor for any signs/symptoms of bleeding and will follow up with PT/INR in the a.m.   Maryanna Shape, PharmD, BCPS  Clinical Pharmacist  Pager:  [phone]  10/18/2014 8:37 AM

## 2014-10-18 NOTE — Progress Notes (Signed)
Patient ID: [name], female   DOB: 07/09/1950, 64 y.o.   MRN: [id number]    Subjective: Pt still with c/o pain in her legs and everywhere.    Objective: Vital signs in last 24 hours: Temp:  [97.7 F (36.5 C)-97.9 F (36.6 C)] 97.7 F (36.5 C) (10/19 0647) Pulse Rate:  [96-100] 100 (10/19 0647) Resp:  [18] 18 (10/19 0647) BP: (96-102)/(53-57) 96/57 mmHg (10/19 0647) SpO2:  [92 %-94 %] 94 % (10/19 0647) Last BM Date: 10/17/14  Intake/Output from previous day: 10/18 0701 - 10/19 0700 In: -  Out: 3025 [Urine:3025] Intake/Output this shift:    PE: Skin: bilateral thigh wounds still with necrotic tissue present.  The medial left thigh wound was not able to be visualized secondary to pain issues today.  Lab Results:   Recent Labs  10/18/14 0354  WBC 11.9*  HGB 8.4*  HCT 26.0*  PLT 278   BMET  Recent Labs  10/17/14 0415 10/18/14 0354  NA 133* 134*  K 4.0 3.3*  CL 89* 88*  CO2 32 34*  GLUCOSE 150* 125*  BUN 96* 98*  CREATININE 2.72* 2.63*  CALCIUM 7.8* 7.7*   PT/INR  Recent Labs  10/17/14 0415 10/18/14 0354  LABPROT 23.2* 23.3*  INR 2.03* 2.06*   CMP     Component Value Date/Time   NA 134* 10/18/2014 0354   K 3.3* 10/18/2014 0354   CL 88* 10/18/2014 0354   CO2 34* 10/18/2014 0354   GLUCOSE 125* 10/18/2014 0354   BUN 98* 10/18/2014 0354   CREATININE 2.63* 10/18/2014 0354   CREATININE 2.10* 05/28/2014 0001   CREATININE 1.28* 02/12/2011 1000   CALCIUM 7.7* 10/18/2014 0354   CALCIUM 9.5 03/24/2012 1212   PROT 6.2 10/12/2014 0510   ALBUMIN 1.4* 10/12/2014 0510   AST 32 10/12/2014 0510   ALT 17 10/12/2014 0510   ALKPHOS 149* 10/12/2014 0510   BILITOT 1.1 10/12/2014 0510   GFRNONAA 18* 10/18/2014 0354   GFRAA 21* 10/18/2014 0354   Lipase  No results found for this basename: lipase       Studies/Results: No results found.  Anti-infectives: Anti-infectives   Start     Dose/Rate Route Frequency Ordered Stop   10/13/14 2100  cefTRIAXone  (ROCEPHIN) 1 g in dextrose 5 % 50 mL IVPB     1 g 100 mL/hr over 30 Minutes Intravenous Every 24 hours 10/13/14 2034     10/11/14 1800  fluconazole (DIFLUCAN) tablet 100 mg     100 mg Oral Daily 10/11/14 1702 10/15/14 2359   09/19/14 2000  ceFAZolin (ANCEF) IVPB 2 g/50 mL premix     2 g 100 mL/hr over 30 Minutes Intravenous Every 8 hours 09/19/14 1307 09/22/14 2032   09/13/14 1200  ceFAZolin (ANCEF) IVPB 2 g/50 mL premix  Status:  Discontinued     2 g 100 mL/hr over 30 Minutes Intravenous Every 12 hours 09/13/14 1141 09/19/14 1307   09/13/14 1000  levofloxacin (LEVAQUIN) IVPB 500 mg  Status:  Discontinued     500 mg 100 mL/hr over 60 Minutes Intravenous Every 48 hours 09/11/14 0958 09/12/14 1640   09/11/14 2300  vancomycin (VANCOCIN) 1,750 mg in sodium chloride 0.9 % 500 mL IVPB  Status:  Discontinued     1,750 mg 250 mL/hr over 120 Minutes Intravenous Every 48 hours 09/09/14 2158 09/13/14 1139   09/11/14 1800  ciprofloxacin (CIPRO) IVPB 400 mg  Status:  Discontinued     400 mg 200 mL/hr over  60 Minutes Intravenous Every 24 hours 09/10/14 1723 09/11/14 0913   09/11/14 1030  levofloxacin (LEVAQUIN) IVPB 500 mg     500 mg 100 mL/hr over 60 Minutes Intravenous  Once 09/11/14 0958 09/11/14 1141   09/10/14 1645  ciprofloxacin (CIPRO) IVPB 400 mg     400 mg 200 mL/hr over 60 Minutes Intravenous STAT 09/10/14 1640 09/10/14 1844   09/09/14 2300  metroNIDAZOLE (FLAGYL) IVPB 500 mg  Status:  Discontinued     500 mg 100 mL/hr over 60 Minutes Intravenous Every 8 hours 09/09/14 2124 09/11/14 0915   09/09/14 2200  vancomycin (VANCOCIN) 1,500 mg in sodium chloride 0.9 % 500 mL IVPB     1,500 mg 250 mL/hr over 120 Minutes Intravenous  Once 09/09/14 2158 09/10/14 0207   09/09/14 1800  vancomycin (VANCOCIN) IVPB 1000 mg/200 mL premix     1,000 mg 200 mL/hr over 60 Minutes Intravenous  Once 09/09/14 1754 09/09/14 2042       Assessment/Plan  1. Necrotic bilateral thigh wounds 2. Multiple chronic  medical problems  Plan: 1. Not much to offer at this point.  Would continue hydrotherapy and santyl for these wounds.  Awaiting placement at [facility].   LOS: 39 days    Patriciann Becht E 10/18/2014, 10:04 AM Pager: [phone]

## 2014-10-18 NOTE — Progress Notes (Signed)
Physical Therapy Treatment Patient Details Name: [name] MRN: [id number] DOB: September 29, 1950 Today's Date: 10/18/2014    History of Present Illness 64 y.o. female  with history of fibromyalgia, hypothyroidism, solitary kidney, hypothyroidism, diabetes, nonischemic cardiomyopathy with ejection fraction of 35%, atrial fibrillation who presented to the emergency department with left-sided lower leg pain. She was admitted on 9/10 w/ recurrent Lower extremity cellulitis. PCCM asked to see in consult on 9/11 for persistent hypotension and worsening renal failure.  She had a previous admission for LE cellulitis in 07/2014 followed by [facility].  Pt had returned home with HHPT prior to this admission.     PT Comments    Pt admitted with above. Pt currently with functional limitations due to the deficits listed below (see PT Problem List).  Pt upset today but still pushed through and participated what she could.  Wounds smelled foul.  Nursing aware.  Pt will benefit from skilled PT to increase their independence and safety with mobility to allow discharge to the venue listed below.    Follow Up Recommendations  LTACH     Equipment Recommendations  None recommended by PT    Recommendations for Other Services       Precautions / Restrictions Precautions Precautions: Fall Restrictions Weight Bearing Restrictions: No    Mobility  Bed Mobility Overal bed mobility: Needs Assistance;+2 for physical assistance Bed Mobility: Rolling Rolling: Mod assist;+2 for physical assistance   Supine to sit: +2 for physical assistance;Max assist Sit to supine: Max assist;+2 for physical assistance   General bed mobility comments: Rail use for rolling over onto R elbow and then truncal assist to come up.  LE are symmetrically assisted to EOB in a "helicopter-like " maneuver.Changed all pts pads once back in bed as they were soiled with drainage.    Transfers                    Ambulation/Gait                  Stairs            Wheelchair Mobility    Modified Rankin (Stroke Patients Only)       Balance Overall balance assessment: Needs assistance;History of Falls Sitting-balance support: Feet supported;No upper extremity supported Sitting balance-Leahy Scale: Fair Sitting balance - Comments: sat EOB 15 min while doing a few sitting exercises with UE and LEs.                              Cognition Arousal/Alertness: Awake/alert Behavior During Therapy: Anxious Overall Cognitive Status: Within Functional Limits for tasks assessed                      Exercises General Exercises - Upper Extremity Shoulder Flexion: Strengthening;Both;10 reps;Theraband;Seated Theraband Level (Shoulder Flexion): Level 1 (Yellow) Shoulder Horizontal ABduction: Strengthening;Both;10 reps;Theraband;Seated Theraband Level (Shoulder Horizontal Abduction): Level 1 (Yellow) Shoulder Horizontal ADduction: AROM;Strengthening;Both;10 reps;Seated;Theraband Theraband Level (Shoulder Horizontal Adduction): Level 1 (Yellow) Elbow Flexion: Strengthening;Both;10 reps;Theraband;Seated Theraband Level (Elbow Flexion): Level 1 (Yellow) General Exercises - Lower Extremity Ankle Circles/Pumps: AROM;Strengthening;Both;20 reps;Seated Long Arc Quad: AROM;Both;10 reps;Seated    General Comments General comments (skin integrity, edema, etc.): When changing pts linens (they had drainage from wounds on them, noted that her bottom had several areas of skin breakdown that this PT had not noted in previous sessions.  notified Zella Ball, nurse regarding these wounds.  Pertinent Vitals/Pain Pain Assessment: 0-10 Faces Pain Scale: Hurts worst Pain Location: legs and buttocks Pain Descriptors / Indicators: Burning;Constant;Stabbing;Throbbing Pain Intervention(s): Limited activity within patient's tolerance;Monitored during session;Premedicated before session;Repositioned VSS     Home Living                      Prior Function            PT Goals (current goals can now be found in the care plan section) Progress towards PT goals: Progressing toward goals    Frequency  Min 2X/week    PT Plan Current plan remains appropriate    Co-evaluation             End of Session   Activity Tolerance: Patient limited by fatigue;Patient limited by pain Patient left: in bed;with call bell/phone within reach     Time: 2956-2130 PT Time Calculation (min): 34 min  Charges:  $Therapeutic Exercise: 8-22 mins $Therapeutic Activity: 8-22 mins                    G CodesIrwin Brakeman F 11/15/2014, 11:21 AM [facility] [phone] (pager)

## 2014-10-18 NOTE — Progress Notes (Signed)
PROGRESS NOTE    [name] NKN:[id number] DOB: 1950/10/22 DOA: 09/09/2014 PCP: Elby Showers, MD  HPI/Brief narrative 64 y.o. female with history of Morbid obesity, CKD 4, fibromyalgia, hypothyroidism, solitary kidney, hypothyroidism, diabetes, nonischemic cardiomyopathy with ejection fraction of 35%, atrial fibrillation who was admitted on 9/10 w/ recurrent Lower extremity cellulitis, immediately went into Septic shock- was in ICU on pressors until 9/14. Subsequently transferred to the Floor. Has completed antibiotics. Undergoing wound care/hydrotherapy per PT under [facility] supervision. Cardiology had signed off-family requested reconsult. Palliative care team consulted for goals of care.patient is trying to change her insurance so she will be eligible for LTAC. As per the patient , her insurance will not be changed tillt he January of next year. Her sister is trying to see if she can go to [facility] on private pay. Discharged disposition pending-case management working on it.   Assessment/Plan:  1. Septic shock: Due to bilateral leg cellulitis. Required vasopressors in ICU. Resolved.  2. Multiple bilateral lower extremity necrotic wounds: Completed antibiotic course. Surgery & WOC followup appreciated. Continue hydrotherapy with PT. She will need OP follow up with plastic surgery. As per discussion with case management, if no SNF available to manage wound care, may be eligible for LTAC, depending on the insurance.  Significant pain during wound care- much better controlled with Dilaudid 2 mg IV x1 prior to dressing changes. Management per surgery. 3. Acute on chronic combined systolic and diastolic CHF/anasarca: Difficult to assess volume status despite large volume diuresis. Weights have been inaccurate and not regularly recorded. Cardiology switched her to oral diuretics-torsemide and metolazone. We have held metolazone for increasing creatinine. We will restart in am. Marked hypoalbuminemia also  contributing to anasarca. Swelling of bilateral legs probably from chronic venous stasis. Cardiology signed off. Patient continues to improve. Please call the [place] office when she is ready for discharge and cardiology will arrange a f/u appt with Dr Sallyanne Kuster or his PA/NP. Please call if cardiac issues arise.   4. A. Fib/s/p PPM: Controlled ventricular rate. Coumadin per pharmacy.  5. Hypotension: resolved 6. 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 Continue Lantus, NovoLog mealtime and SSI.  CBG (last 3)   Recent Labs  10/18/14 0613 10/18/14 1109 10/18/14 1610  GLUCAP 136* 108* 144*     7. Hypothyroid: Continue levothyroxine. Recent TSH 2 on 05/29/1511 8. History of gout: Stable  9. History of OHS/morbid obesity: Nightly CPAP 10. Tremors: Gabapentin discontinued. Seems to have resolved.  11. Acute on stage IV chronic kidney disease: No baseline creatinine probably in the low 2 range. Creatinine gradually improving. Follow BMP periodically. 12. RUE swelling: Dopplers negative for DVT. Likely from anasarca. 13. Anemia: Secondary to chronic kidney disease, acute illness and iron deficiency: Stable 14. Hypokalemia:  Will be Replaced. 15. Fecal impaction/Constipation: Significant relief after digital disimpaction and enema 10/13. She wishes to continue Colace  but declines MiraLAX. Likely multifactorial from immobility, pain medications, poor diet and iron supplements. 16. Fungal rash (of back and back of arms): Seems extensive and less likely to improve from topical nystatin. Brief course of by mouth fluconazole-DC ed after 5 days. 17.  UTI: urine cultures ordered and started on rocephin. Urine cultures show klebsiella sensitive for rocephin. 18. Prolonged QTC: Please see discussion in #16. Try to keep potassium greater than >4 and magnesium >2. Her QTC is improving.   Code Status: Full Family Communication: Discussed with patient's sister Ms.  Penny at bedside. Disposition Plan: LTAC vs SNF  when bed available-approaching medical stability for discharge. Discussed at length with patient's sister [name] at bedside on10/19  Consultants:  General surgery   Cardiology  Critical care medicine-signed off  Nephrology-signed off  [place] - pending-discussed with the palliative care team and will try to arrange meeting today.  Procedures:  Central venous catheter insertion procedure on 09/10/2014 >Will attempt to try for a peripheral IV line, then DC central line.  Hydrotherapy   Antibiotics:  Completed all antibiotics 9/23   Subjective:  Still frustrated about not being able to switch the insurances. Her IV appears to have infiltrated. We have requested IR to put a central line and she has poor venous access.  Objective: Filed Vitals:   10/17/14 0522 10/17/14 0840 10/17/14 1931 10/18/14 0647  BP: 86/47 105/57 102/53 96/57  Pulse: 86 88 96 100  Temp: 97.9 F (36.6 C) 98.2 F (36.8 C) 97.9 F (36.6 C) 97.7 F (36.5 C)  TempSrc: Oral Oral Oral Oral  Resp: 18 18 18 18   Height:      Weight:      SpO2: 95% 92% 92% 94%    Intake/Output Summary (Last 24 hours) at 10/18/14 1809 Last data filed at 10/18/14 1600  Gross per 24 hour  Intake    240 ml  Output   4250 ml  Net  -4010 ml   Filed Weights   10/08/14 0423  Weight: 136.533 kg (301 lb)     Exam:  General exam: Moderately built and morbidly obese female lying comfortably propped up in bed. Appears pleasant and cheerful this morning. Respiratory system: Clear. No increased work of breathing. Cardiovascular system: S1 & S2 heard, RRR. No JVD, murmurs, gallops, clicks. 2+ pitting bilateral leg edema/anasarca-leg edema appears chronic. Telemetry: A. fib with ventricular rate in the 90s. Gastrointestinal system: Abdomen is nondistended, soft and nontender. Normal bowel sounds heard. Central nervous system: Alert and oriented. No focal  neurological deficits. Extremities: Symmetric 5 x 5 power.Multiple bilateral lower extremity wounds-details as per daily PT note. Chronic and patchy mild bilateral lower leg faint erythema-stable. Skin: Extensive patchy redness of back and back of bilateral arms suggestive of fungal infection- seem much better.  Data Reviewed: Basic Metabolic Panel:  Recent Labs Lab 10/12/14 1640  10/14/14 1155 10/15/14 0405 10/16/14 0405 10/17/14 0415 10/18/14 0354  NA  --   < > 135* 135* 133* 133* 134*  K  --   < > 3.6* 4.4 4.3 4.0 3.3*  CL  --   < > 90* 91* 90* 89* 88*  CO2  --   < > 33* 34* 32 32 34*  GLUCOSE  --   < > 138* 118* 98 150* 125*  BUN  --   < > 93* 96* 94* 96* 98*  CREATININE  --   < > 2.27* 2.39* 2.56* 2.72* 2.63*  CALCIUM  --   < > 7.8* 7.8* 7.8* 7.8* 7.7*  MG 1.8  --  1.8  --   --   --   --   < > = values in this interval not displayed. Liver Function Tests:  Recent Labs Lab 10/12/14 0510  AST 32  ALT 17  ALKPHOS 149*  BILITOT 1.1  PROT 6.2  ALBUMIN 1.4*   No results found for this basename: LIPASE, AMYLASE,  in the last 168 hours No results found for this basename: AMMONIA,  in the last 168 hours CBC:  Recent Labs Lab 10/12/14 0510 10/18/14 0354  WBC  10.8* 11.9*  HGB 8.9* 8.4*  HCT 28.0* 26.0*  MCV 100.4* 97.7  PLT 295 278   Cardiac Enzymes: No results found for this basename: CKTOTAL, CKMB, CKMBINDEX, TROPONINI,  in the last 168 hours BNP (last 3 results)  Recent Labs  09/30/14 0520  PROBNP 8120.0*   CBG:  Recent Labs Lab 10/17/14 1134 10/17/14 2131 10/18/14 0613 10/18/14 1109 10/18/14 1610  GLUCAP 149* 196* 136* 108* 144*    Recent Results (from the past 240 hour(s))  URINE CULTURE     Status: None   Collection Time    10/13/14  2:52 PM      Result Value Ref Range Status   Specimen Description URINE, RANDOM   Final   Special Requests ADDED [id number]   Final   Culture  Setup Time     Final   Value: 10/13/2014 22:08     Performed  at [facility]     Final   Value: >=100,000 COLONIES/ML     Performed at [facility]   Culture     Final   Value: KLEBSIELLA PNEUMONIAE     Performed at [facility]   Report Status 10/16/2014 FINAL   Final   Organism ID, Bacteria KLEBSIELLA PNEUMONIAE   Final  MRSA PCR SCREENING     Status: None   Collection Time    10/18/14  9:12 AM      Result Value Ref Range Status   MRSA by PCR NEGATIVE  NEGATIVE Final   Comment:            The GeneXpert MRSA Assay (FDA     approved for NASAL specimens     only), is one component of a     comprehensive MRSA colonization     surveillance program. It is not     intended to diagnose MRSA     infection nor to guide or     monitor treatment for     MRSA infections.        Studies: No results found.      Scheduled Meds: . amiodarone  400 mg Oral Daily  . calcitRIOL  0.25 mcg Oral BID  . calcium-vitamin D  1 tablet Oral BID  . cefTRIAXone (ROCEPHIN)  IV  1 g Intravenous Q24H  . collagenase   Topical TID  . febuxostat  80 mg Oral Daily  .  HYDROmorphone (DILAUDID) injection  2 mg Intravenous Daily  . insulin aspart  0-15 Units Subcutaneous TID WC  . insulin aspart  0-5 Units Subcutaneous QHS  . insulin aspart  2 Units Subcutaneous TID WC  . insulin glargine  22 Units Subcutaneous QHS  . iron polysaccharides  150 mg Oral BID  . levothyroxine  125 mcg Oral QAC breakfast  . linagliptin  5 mg Oral Daily  . multivitamin with minerals  1 tablet Oral Daily  . saccharomyces boulardii  250 mg Oral BID  . senna  2 tablet Oral BID  . silver nitrate applicators  1 application Topical Once  . sodium chloride  3 mL Intravenous Q12H  . torsemide  60 mg Oral BID  . 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   Obesity  hypoventilation syndrome-  Cardiomyopathy, nonischemic- EF 30-35% June 2014   Type 2 diabetes mellitus   Chronic combined systolic and diastolic CHF   Long term (current) use of anticoagulants   CKD (chronic kidney disease) stage 4, GFR 15-29 ml/min   PAF- recurrent despite Amiodarone and multiple cardioversions   Mitral insufficiency, moderate to severe   Chronic massive bilat LE lymphadema   Fall at home- 3 falls this summer, sound orthostatic   Hypotension   Acute on chronic renal failure   Coarse tremors   DNR (do not resuscitate) discussion   Palliative care encounter   Weakness generalized   UTI (urinary tract infection)    Time spent: 40 minutes.    Hosie Poisson, MD, [facility] Pager [phone]  If 7PM-7AM, please contact night-coverage [email or web address] Password [email or web address] 10/18/2014, 6:09 PM    LOS: 39 days

## 2014-10-18 NOTE — Progress Notes (Signed)
Chaplain responded to call for completion of advanced directive. Pt was clear and coherent. After completion chaplain spoke with pt about hospitalization. Pt teary as she spoke about length of stay and pain. Chaplain recommends follow up by unit chaplain.   10/18/14 1400  Clinical Encounter Type  Visited With Patient and family together  Visit Type Follow-up;Spiritual support;Other (Comment) (Advanced Directive)  Referral From Palliative care team  Spiritual Encounters  Spiritual Needs Emotional  Stress Factors  Patient Stress Factors Health changes;Exhausted  [place] Mccuen, Barbette Hair, Chaplain 10/18/2014 2:53 PM

## 2014-10-18 NOTE — Progress Notes (Signed)
Physical Therapy Wound Treatment Patient Details  Name: [name] MRN: [id number] Date of Birth: 04-12-1950  Today's Date: 10/18/2014 Time: 5631-4970 Time Calculation (min): 48 min  Subjective  Subjective: Pt reporting she has had a difficult day.  Pain Score: Pain Score: Pt with severe pain. Premedicated.  Wound Assessment  Pressure Ulcer 09/20/14 Deep Tissue Injury - Purple or maroon localized area of discolored intact skin or blood-filled blister due to damage of underlying soft tissue from pressure and/or shear. (Active)  Dressing Type Gauze (Comment);ABD;Mesh briefs;Other (Comment) 10/18/2014  3:46 PM  Dressing Changed 10/18/2014  3:46 PM  Dressing Change Frequency Other (Comment) 10/18/2014  3:46 PM  State of Healing Eschar 10/18/2014  3:46 PM  Site / Wound Assessment Yellow;Pink;Painful 10/18/2014  3:46 PM  % Wound base Red or Granulating 5% 10/18/2014  3:46 PM  % Wound base Yellow 95% 10/18/2014  3:46 PM  % Wound base Black 0% 10/18/2014  3:46 PM  % Wound base Other (Comment) 0% 10/18/2014  3:46 PM  Peri-wound Assessment Induration;Erythema (blanchable) 10/18/2014  3:46 PM  Wound Length (cm) 4.5 cm 10/14/2014  4:06 PM  Wound Width (cm) 5.6 cm 10/14/2014  4:06 PM  Wound Depth (cm) 2 cm 10/14/2014  4:06 PM  Margins Unattached edges (unapproximated) 10/18/2014  3:46 PM  Drainage Amount Moderate 10/18/2014  3:46 PM  Drainage Description Serosanguineous;Odor 10/18/2014  3:46 PM  Treatment Debridement (Selective);Hydrotherapy (Pulse lavage) 10/18/2014  3:46 PM     Wound / Incision (Open or Dehisced) 09/10/14 Other (Comment) Thigh Medial;Left;Posterior;Proximal (Active)  Dressing Type Gauze (Comment);ABD;Mesh briefs 10/18/2014  3:46 PM  Dressing Changed Changed 10/18/2014  3:46 PM  Dressing Status Clean;Dry;Intact 10/18/2014  3:46 PM  Dressing Change Frequency Other (Comment) 10/18/2014  3:46 PM  Site / Wound Assessment Yellow;Black;Painful 10/18/2014  3:46 PM  % Wound  base Red or Granulating 0% 10/16/2014 10:57 AM  % Wound base Yellow 90% 10/16/2014 10:57 AM  % Wound base Black 10% 10/16/2014 10:57 AM  % Wound base Other (Comment) 0% 10/16/2014 10:57 AM  Peri-wound Assessment Induration;Edema 10/18/2014  3:46 PM  Wound Length (cm) 8 cm 10/14/2014  4:06 PM  Wound Width (cm) 8.5 cm 10/14/2014  4:06 PM  Wound Depth (cm) 4.5 cm 10/14/2014  4:06 PM  Margins Unattached edges (unapproximated) 10/18/2014  3:46 PM  Closure None 10/18/2014  3:46 PM  Drainage Amount Moderate 10/18/2014  3:46 PM  Drainage Description Serosanguineous;Odor 10/18/2014  3:46 PM  Non-staged Wound Description Full thickness 10/18/2014  3:46 PM  Treatment Debridement (Selective);Hydrotherapy (Pulse lavage) 10/18/2014  3:46 PM     Wound / Incision (Open or Dehisced) 09/18/14 Other (Comment) Hip Right Black eschar tissue surrounded by pink tissue (Active)  Dressing Type Gauze (Comment);ABD;Mesh briefs 10/18/2014  3:46 PM  Dressing Changed Changed 10/18/2014  3:46 PM  Dressing Status Clean;Dry;Intact 10/18/2014  3:46 PM  Dressing Change Frequency Other (Comment) 10/18/2014  8:50 AM  Site / Wound Assessment Yellow;Red;Painful 10/18/2014  3:46 PM  % Wound base Red or Granulating 5% 10/18/2014  3:46 PM  % Wound base Yellow 95% 10/18/2014  3:46 PM  % Wound base Black 0% 10/18/2014  3:46 PM  % Wound base Other (Comment) 0% 10/18/2014  3:46 PM  Peri-wound Assessment Edema;Induration 10/18/2014  3:46 PM  Wound Length (cm) 9.4 cm 10/15/2014  3:28 PM  Wound Width (cm) 8 cm 10/15/2014  3:28 PM  Wound Depth (cm) 1.5 cm 10/15/2014  3:28 PM  Margins Unattached edges (unapproximated) 10/16/2014 10:57 AM  Closure None 10/16/2014 10:57 AM  Drainage Amount Moderate 10/18/2014  3:46 PM  Drainage Description Serosanguineous;Odor 10/18/2014  3:46 PM  Non-staged Wound Description Full thickness 10/18/2014  3:46 PM  Treatment Debridement (Selective);Hydrotherapy (Pulse lavage) 10/18/2014  3:46 PM    Hydrotherapy Pulsed lavage therapy - wound location: Rt lateral hip, Lt lateral thigh, Lt proximal-posteriomedial thigh Pulsed Lavage with Suction (psi): 8 psi Pulsed Lavage with Suction - Normal Saline Used: 2000 mL Pulsed Lavage Tip: Tip with splash shield Selective Debridement Selective Debridement - Location: Rt lateral hip, Lt lateral thigh, Lt proximal-posteriomedial thigh Selective Debridement - Tools Used: Scissors;Forceps Selective Debridement - Tissue Removed: yellow/brown eschar, yellow slough, fatty tissue   Wound Assessment and Plan  Wound Therapy - Assess/Plan/Recommendations Wound Therapy - Clinical Statement: Pt making very slow progress with hydrotherapy. Wound Therapy - Functional Problem List: painful wounds limiting her mobility tolerance Factors Delaying/Impairing Wound Healing: Diabetes Mellitus;Infection - systemic/local;Immobility;Multiple medical problems;Vascular compromise Hydrotherapy Plan: Debridement;Dressing change;Patient/family education;Pulsatile lavage with suction Wound Therapy - Frequency: 6X / week Wound Therapy - Follow Up Recommendations: Skilled nursing facility Wound Plan: see above  Wound Therapy Goals- Improve the function of patient's integumentary system by progressing the wound(s) through the phases of wound healing (inflammation - proliferation - remodeling) by: Decrease Necrotic Tissue to: <75% Decrease Necrotic Tissue - Progress: Not progressing Increase Granulation Tissue to: >25% Increase Granulation Tissue - Progress: Mot progressing Improve Drainage Characteristics: Min Improve Drainage Characteristics - Progress: Progressing toward goal  Goals will be updated until maximal potential achieved or discharge criteria met.  Discharge criteria: when goals achieved, discharge from hospital, MD decision/surgical intervention, no progress towards goals, refusal/missing three consecutive treatments without notification or medical  reason.  GP     Darriel Sinquefield 10/18/2014, 4:04 PM  [place]

## 2014-10-18 NOTE — Progress Notes (Signed)
I have seen and examined the patient and agree with the assessment and plans.  Elveria Lauderbaugh A. Samiyyah [name]  MD, FACS  

## 2014-10-18 NOTE — Progress Notes (Signed)
Patient placed on contact(orange) precautions this am for MDRO in her urine. She has been educated on that.

## 2014-10-19 ENCOUNTER — Inpatient Hospital Stay (HOSPITAL_COMMUNITY): Payer: Medicare Other

## 2014-10-19 ENCOUNTER — Other Ambulatory Visit (HOSPITAL_COMMUNITY): Payer: Medicare Other

## 2014-10-19 DIAGNOSIS — L97909 Non-pressure chronic ulcer of unspecified part of unspecified lower leg with unspecified severity: Secondary | ICD-10-CM

## 2014-10-19 DIAGNOSIS — L089 Local infection of the skin and subcutaneous tissue, unspecified: Secondary | ICD-10-CM

## 2014-10-19 LAB — GLUCOSE, CAPILLARY
GLUCOSE-CAPILLARY: 179 mg/dL — AB (ref 70–99)
GLUCOSE-CAPILLARY: 149 mg/dL — AB (ref 70–99)
Glucose-Capillary: 176 mg/dL — ABNORMAL HIGH (ref 70–99)
Glucose-Capillary: 144 mg/dL — ABNORMAL HIGH (ref 70–99)
Glucose-Capillary: 149 mg/dL — ABNORMAL HIGH (ref 70–99)
Glucose-Capillary: 121 mg/dL — ABNORMAL HIGH (ref 70–99)
Glucose-Capillary: 141 mg/dL — ABNORMAL HIGH (ref 70–99)
Glucose-Capillary: 168 mg/dL — ABNORMAL HIGH (ref 70–99)

## 2014-10-19 LAB — BASIC METABOLIC PANEL
Anion gap: 13 (ref 5–15)
BUN: 87 mg/dL — ABNORMAL HIGH (ref 6–23)
CALCIUM: 7.7 mg/dL — AB (ref 8.4–10.5)
CO2: 34 mEq/L — ABNORMAL HIGH (ref 19–32)
Chloride: 89 mEq/L — ABNORMAL LOW (ref 96–112)
Creatinine, Ser: 2.24 mg/dL — ABNORMAL HIGH (ref 0.50–1.10)
GFR calc Af Amer: 25 mL/min — ABNORMAL LOW (ref >90)
GFR calc non Af Amer: 22 mL/min — ABNORMAL LOW (ref >90)
GLUCOSE: 151 mg/dL — AB (ref 70–99)
Potassium: 2.8 mEq/L — CL (ref 3.7–5.3)
SODIUM: 136 meq/L — AB (ref 137–147)

## 2014-10-19 LAB — PROTIME-INR
INR: 2.39 — AB (ref 0.00–1.49)
PROTHROMBIN TIME: 26.2 s — AB (ref 11.6–15.2)

## 2014-10-19 LAB — MAGNESIUM: MAGNESIUM: 1.7 mg/dL (ref 1.5–2.5)

## 2014-10-19 MED ORDER — POTASSIUM CHLORIDE CRYS ER 20 MEQ PO TBCR
40.0000 meq | EXTENDED_RELEASE_TABLET | Freq: Two times a day (BID) | ORAL | Status: AC
  Administered 2014-10-19 (×2): 40 meq via ORAL
  Filled 2014-10-19 (×2): qty 2

## 2014-10-19 MED ORDER — POTASSIUM CHLORIDE 10 MEQ/100ML IV SOLN
10.0000 meq | INTRAVENOUS | Status: AC
  Filled 2014-10-19 (×2): qty 100

## 2014-10-19 MED ORDER — WARFARIN SODIUM 2.5 MG PO TABS
2.5000 mg | ORAL_TABLET | Freq: Once | ORAL | Status: AC
  Administered 2014-10-19: 2.5 mg via ORAL
  Filled 2014-10-19: qty 1

## 2014-10-19 MED ORDER — HYDROMORPHONE HCL 1 MG/ML IJ SOLN
INTRAMUSCULAR | Status: AC
  Administered 2014-10-19: 1 mg via INTRAVENOUS
  Filled 2014-10-19: qty 1

## 2014-10-19 MED ORDER — LIDOCAINE HCL 1 % IJ SOLN
INTRAMUSCULAR | Status: AC
  Filled 2014-10-19: qty 20

## 2014-10-19 MED ORDER — METOLAZONE 2.5 MG PO TABS
2.5000 mg | ORAL_TABLET | ORAL | Status: DC
  Administered 2014-10-19 – 2014-10-25 (×4): 2.5 mg via ORAL
  Filled 2014-10-19 (×5): qty 1

## 2014-10-19 MED ORDER — POTASSIUM CHLORIDE 10 MEQ/100ML IV SOLN
10.0000 meq | INTRAVENOUS | Status: AC
  Administered 2014-10-19 (×2): 10 meq via INTRAVENOUS
  Filled 2014-10-19 (×2): qty 100

## 2014-10-19 NOTE — Progress Notes (Signed)
Physical Therapy Wound Treatment Patient Details  Name: [name] MRN: [id number] Date of Birth: [birth year]  Today's Date: 10/19/2014 Time: 1130-1247 Time Calculation (min): 77 min  Subjective  Subjective: pt reports going to get a central line because iv not working well. 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 Subcutaneous fat may be visible but bone, tendon or muscle are NOT exposed. 3.5 X 2 (Active)  Dressing Type ABD 10/17/2014  7:38 PM  Dressing Changed 10/09/2014  3:30 AM  Dressing Change Frequency Other (Comment) 10/17/2014  7:38 PM  State of Healing Early/partial granulation 09/26/2014 11:00 AM  Site / Wound Assessment Bleeding 09/29/2014  8:00 PM  % Wound base Red or Granulating 100% 09/29/2014  8:00 PM  % Wound base Yellow 75% 09/26/2014 11:00 AM  % Wound base Black 0% 09/26/2014 11:00 AM  % Wound base Other (Comment) 0% 09/26/2014 11:00 AM  Peri-wound Assessment Intact;Pink 09/26/2014 11:00 AM  Wound Length (cm) 3.5 cm 09/11/2014  8:00 AM  Wound Width (cm) 2 cm 09/11/2014  8:00 AM  Margins Unattached edges (unapproximated) 10/17/2014  7:38 PM  Drainage Amount Copious 10/07/2014  5:30 AM  Drainage Description Sanguineous 10/07/2014  5:30 AM  Treatment Cleansed;Other (Comment) 10/01/2014  9:30 AM     Pressure Ulcer 09/20/14 Deep Tissue Injury - Purple or maroon localized area of discolored intact skin or blood-filled blister due to damage of underlying soft tissue from pressure and/or shear. (Active)  Dressing Type ABD 10/17/2014  7:38 PM  Dressing Clean;Dry;Intact 10/17/2014  7:38 PM  Dressing Change Frequency Other (Comment) 10/17/2014  7:38 PM  State of Healing Non-healing 09/29/2014  8:04 PM  % Wound base Red or Granulating 90% 09/29/2014  8:04 PM  % Wound base Yellow 10% 09/29/2014  8:04 PM  Margins Unattached edges (unapproximated)  09/29/2014  8:04 PM  Drainage Amount Scant 09/29/2014  8:04 PM  Drainage Description Serosanguineous 09/29/2014  8:04 PM  Treatment Cleansed;Other (Comment) 09/29/2014  8:04 PM     Pressure Ulcer 09/20/14 Deep Tissue Injury - Purple or maroon localized area of discolored intact skin or blood-filled blister due to damage of underlying soft tissue from pressure and/or shear. (Active)  Dressing Type Gauze (Comment);ABD;Mesh briefs;Other (Comment) 10/19/2014 12:58 PM  Dressing Changed 10/19/2014 12:58 PM  Dressing Change Frequency Other (Comment) 10/19/2014 12:58 PM  State of Healing Eschar 10/19/2014 12:58 PM  Site / Wound Assessment Yellow;Pink;Painful 10/19/2014 12:58 PM  % Wound base Red or Granulating 5% 10/19/2014 12:58 PM  % Wound base Yellow 75% 10/19/2014 12:58 PM  % Wound base Black 20% 10/19/2014 12:58 PM  % Wound base Other (Comment) 0% 10/19/2014 12:58 PM  Peri-wound Assessment Induration;Erythema (blanchable) 10/19/2014 12:58 PM  Wound Length (cm) 4.5 cm 10/14/2014  4:06 PM  Wound Width (cm) 5.6 cm 10/14/2014  4:06 PM  Wound Depth (cm) 2 cm 10/14/2014  4:06 PM  Margins Unattached edges (unapproximated) 10/19/2014 12:58 PM  Drainage Amount Moderate 10/19/2014 12:58 PM  Drainage Description Serosanguineous;Odor 10/19/2014 12:58 PM  Treatment Cleansed;Debridement (Selective);Hydrotherapy (Pulse lavage);Packing (Dry gauze) 10/19/2014 12:58 PM     Wound 06/02/13 Abrasion(s) Leg Left (Active)  Dressing Type ABD;Gauze (Comment);Mesh briefs 10/18/2014  8:00 PM  Dressing Changed Changed 10/13/2014 11:45 PM  Dressing Status Clean;Dry;Intact 10/15/2014  3:28 PM  Dressing Change Frequency Daily 10/15/2014  3:28 PM  Site / Wound Assessment  Red 10/15/2014  3:28 PM  % Wound base Red or Granulating 100% 10/15/2014  3:28 PM  % Wound base Yellow 0% 10/15/2014  3:28 PM  % Wound base Black 0% 10/15/2014  3:28 PM  % Wound base Other (Comment) 0% 10/15/2014  3:28 PM  Peri-wound Assessment  Intact;Edema;Erythema (blanchable) 10/15/2014  3:28 PM  Margins Unattached edges (unapproximated) 10/15/2014  3:28 PM  Closure None 10/15/2014  3:28 PM  Drainage Amount Moderate 10/15/2014  3:28 PM  Drainage Description Serous 10/15/2014  3:28 PM  Non-staged Wound Description Full thickness 10/15/2014  3:28 PM  Treatment Hydrotherapy (Pulse lavage) 10/15/2014  3:28 PM     Wound / Incision (Open or Dehisced) 07/24/14 Incision - Open Leg Left (Active)     Wound / Incision (Open or Dehisced) 07/24/14 Incision - Open Thigh Right scabbed over circular area (Active)     Wound / Incision (Open or Dehisced) 07/29/14 Other (Comment) Thigh Left;Medial HYDRO (Active)     Wound / Incision (Open or Dehisced) 08/02/14 Other (Comment) Thigh Right;Medial Hydro, R medial thigh wound (Active)     Wound / Incision (Open or Dehisced) 09/10/14 Other (Comment) Thigh Medial;Left;Posterior;Proximal (Active)  Dressing Type Gauze (Comment);ABD;Mesh briefs 10/19/2014 12:58 PM  Dressing Changed Changed 10/19/2014 12:58 PM  Dressing Status Clean;Dry;Intact 10/19/2014 12:58 PM  Dressing Change Frequency Other (Comment) 10/19/2014 12:58 PM  Site / Wound Assessment Yellow;Black;Painful 10/19/2014 12:58 PM  % Wound base Red or Granulating 0% 10/19/2014 12:58 PM  % Wound base Yellow 90% 10/19/2014 12:58 PM  % Wound base Black 10% 10/19/2014 12:58 PM  % Wound base Other (Comment) 0% 10/19/2014 12:58 PM  Peri-wound Assessment Induration;Edema 10/19/2014 12:58 PM  Wound Length (cm) 8 cm 10/14/2014  4:06 PM  Wound Width (cm) 8.5 cm 10/14/2014  4:06 PM  Wound Depth (cm) 4.5 cm 10/14/2014  4:06 PM  Margins Unattached edges (unapproximated) 10/19/2014 12:58 PM  Closure None 10/19/2014 12:58 PM  Drainage Amount Moderate 10/19/2014 12:58 PM  Drainage Description Serosanguineous;Odor 10/19/2014 12:58 PM  Non-staged Wound Description Full thickness 10/19/2014 12:58 PM  Treatment Cleansed;Debridement (Selective);Hydrotherapy  (Pulse lavage);Packing (Dry gauze) 10/19/2014 12:58 PM     Wound / Incision (Open or Dehisced) 09/18/14 Other (Comment) Hip Right Black eschar tissue surrounded by pink tissue (Active)  Dressing Type Gauze (Comment);ABD;Mesh briefs 10/19/2014 12:58 PM  Dressing Changed Changed 10/18/2014  3:46 PM  Dressing Status Clean;Dry;Intact 10/19/2014 12:58 PM  Dressing Change Frequency Other (Comment) 10/18/2014  8:00 PM  Site / Wound Assessment Yellow;Red;Painful 10/19/2014 12:58 PM  % Wound base Red or Granulating 5% 10/19/2014 12:58 PM  % Wound base Yellow 75% 10/19/2014 12:58 PM  % Wound base Black 20% 10/19/2014 12:58 PM  % Wound base Other (Comment) 0% 10/19/2014 12:58 PM  Peri-wound Assessment Edema;Induration 10/19/2014 12:58 PM  Wound Length (cm) 9.4 cm 10/15/2014  3:28 PM  Wound Width (cm) 8 cm 10/15/2014  3:28 PM  Wound Depth (cm) 1.5 cm 10/15/2014  3:28 PM  Margins Unattached edges (unapproximated) 10/16/2014 10:57 AM  Closure None 10/16/2014 10:57 AM  Drainage Amount Moderate 10/19/2014 12:58 PM  Drainage Description Serosanguineous;Odor 10/19/2014 12:58 PM  Non-staged Wound Description Full thickness 10/19/2014 12:58 PM  Treatment Cleansed;Debridement (Selective);Hydrotherapy (Pulse lavage);Packing (Dry gauze) 10/19/2014 12:58 PM     Incision 05/05/12 Perineum Other (Comment) (Active)   Hydrotherapy Pulsed lavage therapy - wound location: Rt lateral hip, Lt lateral thigh, Lt proximal-posteriomedial thigh Pulsed Lavage with Suction (psi): 8 psi Pulsed Lavage with Suction - Normal Saline Used:  2000 mL Pulsed Lavage Tip: Tip with splash shield Selective Debridement Selective Debridement - Location: Rt lateral hip, Lt lateral thigh, Lt proximal-posteriomedial thigh Selective Debridement - Tools Used: Forceps;Scalpel Selective Debridement - Tissue Removed: yellow/brown eschar, yellow slough, fatty tissue   Wound Assessment and Plan  Wound Therapy -  Assess/Plan/Recommendations Wound Therapy - Clinical Statement: Pt making very slow progress with hydrotherapy due to not able to give her enough meds to handle the pain of debridement. Wound Therapy - Functional Problem List: painful wounds limiting her mobility tolerance Factors Delaying/Impairing Wound Healing: Diabetes Mellitus;Infection - systemic/local;Immobility;Multiple medical problems;Vascular compromise Hydrotherapy Plan: Debridement;Dressing change;Patient/family education;Pulsatile lavage with suction Wound Therapy - Frequency: 6X / week Wound Therapy - Follow Up Recommendations: Skilled nursing facility Wound Plan: see above  Wound Therapy Goals- Improve the function of patient's integumentary system by progressing the wound(s) through the phases of wound healing (inflammation - proliferation - remodeling) by: Decrease Necrotic Tissue to: <75% Decrease Necrotic Tissue - Progress: Not progressing (pt unable to handle the pain today.) Increase Granulation Tissue to: >25% Increase Granulation Tissue - Progress: Mot progressing Improve Drainage Characteristics: Min Improve Drainage Characteristics - Progress: Not progressing Goals/treatment plan/discharge plan were made with and agreed upon by patient/family: Yes Time For Goal Achievement: 7 days Wound Therapy - Potential for Goals: Good  Goals will be updated until maximal potential achieved or discharge criteria met.  Discharge criteria: when goals achieved, discharge from hospital, MD decision/surgical intervention, no progress towards goals, refusal/missing three consecutive treatments without notification or medical reason.  GP     Semone Orlov, Tessie Fass 10/19/2014, 1:09 PM  10/19/2014  Donnella Sham, [place] [phone]  (pager)

## 2014-10-19 NOTE — Progress Notes (Signed)
PROGRESS NOTE    [name] YFV:[id number] DOB: 1950-10-24 DOA: 09/09/2014 PCP: Elby Showers, MD  HPI/Brief narrative 64 y.o. female with history of Morbid obesity, CKD 4, fibromyalgia, hypothyroidism, solitary kidney, hypothyroidism, diabetes, nonischemic cardiomyopathy with ejection fraction of 35%, atrial fibrillation who was admitted on 9/10 w/ recurrent Lower extremity cellulitis, immediately went into Septic shock- was in ICU on pressors until 9/14. Subsequently transferred to the Floor. Has completed antibiotics. Undergoing wound care/hydrotherapy per PT under [facility] supervision. Cardiology had signed off-family requested reconsult. Palliative care team consulted for goals of care.patient is trying to change her insurance so she will be eligible for LTAC. As per the patient , her insurance will not be changed tillt he January of next year. Her sister is trying to see if she can go to [facility] on private pay. Discharged disposition pending-case management working on it.   Assessment/Plan:  1. Septic shock: Due to bilateral leg cellulitis. Required vasopressors in ICU. Resolved.  2. Multiple bilateral lower extremity necrotic wounds: Completed antibiotic course. Surgery & WOC followup appreciated. Continue hydrotherapy with PT. She will need OP follow up with plastic surgery. As per discussion with case management, if no SNF available to manage wound care, may be eligible for LTAC, depending on the insurance.  Significant pain during wound care- much better controlled with Dilaudid 2 mg IV x1 prior to dressing changes. Management per surgery. 3. Acute on chronic combined systolic and diastolic CHF/anasarca: Difficult to assess volume status despite large volume diuresis. Weights have been inaccurate and not regularly recorded. Cardiology switched her to oral diuretics-torsemide and metolazone. We have held metolazone for increasing creatinine. We will restarted the metolazone back on 10/20.  Marked hypoalbuminemia also contributing to anasarca. Swelling of bilateral legs probably from chronic venous stasis. Cardiology signed off. Patient continues to improve. Please call the [place] office when she is ready for discharge and cardiology will arrange a f/u appt with Dr Sallyanne Kuster or his PA/NP. Please call if cardiac issues arise.   4. A. Fib/s/p PPM: Controlled ventricular rate. Coumadin per pharmacy.  5. Hypotension: resolved 6. 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 Continue Lantus, NovoLog mealtime and SSI.  CBG (last 3)   Recent Labs  10/19/14 0745 10/19/14 1105 10/19/14 1309  GLUCAP 144* 149* 149*     7. Hypothyroid: Continue levothyroxine. Recent TSH 2 on 05/29/1511 8. History of gout: Stable  9. History of OHS/morbid obesity: Nightly CPAP 10. Tremors: Gabapentin discontinued. Seems to have resolved.  11. Acute on stage IV chronic kidney disease: No baseline creatinine probably in the low 2 range. Creatinine gradually improving. Follow BMP periodically. 12. RUE swelling: Dopplers negative for DVT. Likely from anasarca. 13. Anemia: Secondary to chronic kidney disease, acute illness and iron deficiency: Stable 14. Hypokalemia:  Will be Replaced. Repeat level in am.  15. Fecal impaction/Constipation: Significant relief after digital disimpaction and enema 10/13. She wishes to continue Colace  but declines MiraLAX. Likely multifactorial from immobility, pain medications, poor diet and iron supplements. 16. Fungal rash (of back and back of arms): Seems extensive and less likely to improve from topical nystatin. Brief course of by mouth fluconazole-DC ed after 5 days. 17.  UTI: urine cultures ordered and started on rocephin. Urine cultures show klebsiella sensitive for rocephin. She will have completed 7 days of antibiotics by 10/20 18. Prolonged QTC: Please see discussion in #16. Try to keep potassium greater than >4 and magnesium >2. Her  QTC is improving.  Code Status: Full Family Communication: Discussed with patient's sister [name] at bedside. Disposition Plan: LTAC vs SNF when bed available-approaching medical stability for discharge. Discussed at length with patient's sister [name] at bedside on10/19  Consultants:  General surgery   Cardiology  Critical care medicine-signed off  Nephrology-signed off  [place] - pending-discussed with the palliative care team and will try to arrange meeting today.  Procedures:  Central venous catheter insertion procedure on 09/10/2014 >Will attempt to try for a peripheral IV line, then DC central line.  Hydrotherapy   Antibiotics:  Completed all antibiotics 9/23   Subjective:  Still frustrated about not being able to switch the insurances. Her IV appears to have infiltrated. We have requested IR to put a central line and she has poor venous access.  Objective: Filed Vitals:   10/18/14 0647 10/18/14 1940 10/19/14 0500 10/19/14 1337  BP: 96/57 102/52 101/47 104/55  Pulse: 100 80 81 84  Temp: 97.7 F (36.5 C) 97.6 F (36.4 C) 97.8 F (36.6 C) 98.2 F (36.8 C)  TempSrc: Oral Oral Oral Oral  Resp: 18 17 18 18   Height:      Weight:      SpO2: 94% 90% 95% 100%    Intake/Output Summary (Last 24 hours) at 10/19/14 1352 Last data filed at 10/19/14 1300  Gross per 24 hour  Intake    240 ml  Output   1550 ml  Net  -1310 ml   Filed Weights   10/08/14 0423  Weight: 136.533 kg (301 lb)     Exam:  General exam: Moderately built and morbidly obese female lying comfortably propped up in bed. Appears pleasant and cheerful this morning. Respiratory system: Clear. No increased work of breathing. Cardiovascular system: S1 & S2 heard, RRR. No JVD, murmurs, gallops, clicks. 2+ pitting bilateral leg edema/anasarca-leg edema appears chronic. Telemetry: A. fib with ventricular rate in the 90s. Gastrointestinal system: Abdomen is nondistended, soft and  nontender. Normal bowel sounds heard. Central nervous system: Alert and oriented. No focal neurological deficits. Extremities: Symmetric 5 x 5 power.Multiple bilateral lower extremity wounds-details as per daily PT note. Chronic and patchy mild bilateral lower leg faint erythema-stable. Skin: Extensive patchy redness of back and back of bilateral arms suggestive of fungal infection- seem much better.  Data Reviewed: Basic Metabolic Panel:  Recent Labs Lab 10/12/14 1640  10/14/14 1155 10/15/14 0405 10/16/14 0405 10/17/14 0415 10/18/14 0354  NA  --   < > 135* 135* 133* 133* 134*  K  --   < > 3.6* 4.4 4.3 4.0 3.3*  CL  --   < > 90* 91* 90* 89* 88*  CO2  --   < > 33* 34* 32 32 34*  GLUCOSE  --   < > 138* 118* 98 150* 125*  BUN  --   < > 93* 96* 94* 96* 98*  CREATININE  --   < > 2.27* 2.39* 2.56* 2.72* 2.63*  CALCIUM  --   < > 7.8* 7.8* 7.8* 7.8* 7.7*  MG 1.8  --  1.8  --   --   --   --   < > = values in this interval not displayed. Liver Function Tests: No results found for this basename: AST, ALT, ALKPHOS, BILITOT, PROT, ALBUMIN,  in the last 168 hours No results found for this basename: LIPASE, AMYLASE,  in the last 168 hours No results found for this basename: AMMONIA,  in the last 168 hours CBC:  Recent Labs Lab 10/18/14 0354  WBC 11.9*  HGB 8.4*  HCT 26.0*  MCV 97.7  PLT 278   Cardiac Enzymes: No results found for this basename: CKTOTAL, CKMB, CKMBINDEX, TROPONINI,  in the last 168 hours BNP (last 3 results)  Recent Labs  09/30/14 0520  PROBNP 8120.0*   CBG:  Recent Labs Lab 10/18/14 2121 10/19/14 0554 10/19/14 0745 10/19/14 1105 10/19/14 1309  GLUCAP 179* 176* 144* 149* 149*    Recent Results (from the past 240 hour(s))  URINE CULTURE     Status: None   Collection Time    10/13/14  2:52 PM      Result Value Ref Range Status   Specimen Description URINE, RANDOM   Final   Special Requests ADDED [id number]   Final   Culture  Setup Time     Final    Value: 10/13/2014 22:08     Performed at [facility]     Final   Value: >=100,000 COLONIES/ML     Performed at [facility]   Culture     Final   Value: KLEBSIELLA PNEUMONIAE     Performed at [facility]   Report Status 10/16/2014 FINAL   Final   Organism ID, Bacteria KLEBSIELLA PNEUMONIAE   Final  MRSA PCR SCREENING     Status: None   Collection Time    10/18/14  9:12 AM      Result Value Ref Range Status   MRSA by PCR NEGATIVE  NEGATIVE Final   Comment:            The GeneXpert MRSA Assay (FDA     approved for NASAL specimens     only), is one component of a     comprehensive MRSA colonization     surveillance program. It is not     intended to diagnose MRSA     infection nor to guide or     monitor treatment for     MRSA infections.        Studies: No results found.      Scheduled Meds: . amiodarone  400 mg Oral Daily  . calcitRIOL  0.25 mcg Oral BID  . calcium-vitamin D  1 tablet Oral BID  . cefTRIAXone (ROCEPHIN)  IV  1 g Intravenous Q24H  . collagenase   Topical TID  . febuxostat  80 mg Oral Daily  .  HYDROmorphone (DILAUDID) injection  2 mg Intravenous Daily  . insulin aspart  0-15 Units Subcutaneous TID WC  . insulin aspart  0-5 Units Subcutaneous QHS  . insulin aspart  2 Units Subcutaneous TID WC  . insulin glargine  22 Units Subcutaneous QHS  . iron polysaccharides  150 mg Oral BID  . levothyroxine  125 mcg Oral QAC breakfast  . linagliptin  5 mg Oral Daily  . metolazone  2.5 mg Oral QODAY  . multivitamin with minerals  1 tablet Oral Daily  . saccharomyces boulardii  250 mg Oral BID  . senna  2 tablet Oral BID  . silver nitrate applicators  1 application Topical Once  . sodium chloride  3 mL Intravenous Q12H  . torsemide  60 mg Oral BID  . warfarin  2.5 mg Oral ONCE-1800  . 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   Obesity hypoventilation syndrome-    Cardiomyopathy, nonischemic- EF 30-35% June 2014   Type 2 diabetes mellitus   Chronic combined systolic and diastolic CHF   Long term (current) use of anticoagulants   CKD (chronic kidney disease) stage 4, GFR 15-29 ml/min   PAF- recurrent despite Amiodarone and multiple cardioversions   Mitral insufficiency, moderate to severe   Chronic massive bilat LE lymphadema   Fall at home- 3 falls this summer, sound orthostatic   Hypotension   Acute on chronic renal failure   Coarse tremors   DNR (do not resuscitate) discussion   Palliative care encounter   Weakness generalized   UTI (urinary tract infection)    Time spent: 40 minutes.    Hosie Poisson, MD, [facility] Pager [phone]  If 7PM-7AM, please contact night-coverage [email or web address] Password [email or web address] 10/19/2014, 1:52 PM    LOS: 40 days

## 2014-10-19 NOTE — Progress Notes (Signed)
CRITICAL VALUE ALERT  Critical value received:  K 2.8  Date of notification:  10/19/2014  Time of notification:  1062  Critical value read back:Yes.    Nurse who received alert:  Berenice Primas, RN  MD notified (1st page):  Dr. Karleen Hampshire  Time of first page:  1430  MD notified (2nd page):  Time of second page:  Responding MD:  Dr. Karleen Hampshire  Time MD responded:  1430

## 2014-10-19 NOTE — Progress Notes (Signed)
ANTICOAGULATION CONSULT NOTE - Follow Up Consult  Pharmacy Consult for Warfarin Indication: atrial fibrillation  Allergies  Allergen Reactions  . Ivp Dye [Iodinated Diagnostic Agents] Shortness Of Breath    Turn red, can't breathe  . Betadine [Povidone Iodine]   . Diphenhydramine Hcl Swelling    In hands and eyes  . Fish Allergy Nausea And Vomiting  . Iohexol      Desc: PT TURNS RED AND WHEEZING   . Penicillins Other (See Comments)    Resp arrest as child. Tolerates cephalosporins.  . Povidone-Iodine Other (See Comments)    Wheezing, turn red, can't breath, and blisters  . Promethazine Hcl Other (See Comments)    Low Blood Pressure  . Tape     Blisters, can use paper tape for short periods  . Clindamycin Hives and Rash    wheezing  . Iodine Rash  . Morphine Sulfate Nausea And Vomiting and Rash  . Primaxin [Imipenem] Rash    Patient Measurements: Height: 5' 6.93" (170 cm) Weight:  (unable to obtain) IBW/kg (Calculated) : 61.44  Vital Signs: Temp: 97.8 F (36.6 C) (10/20 0500) Temp Source: Oral (10/20 0500) BP: 101/47 mmHg (10/20 0500) Pulse Rate: 81 (10/20 0500)  Labs:  Recent Labs  10/17/14 0415 10/18/14 0354 10/19/14 0454  HGB  --  8.4*  --   HCT  --  26.0*  --   PLT  --  278  --   LABPROT 23.2* 23.3* 26.2*  INR 2.03* 2.06* 2.39*  CREATININE 2.72* 2.63*  --     Estimated Creatinine Clearance: 30.4 ml/min (by C-G formula based on Cr of 2.63).   Assessment: 48 YOF who continues on warfarin for hx Afib with a therapeutic INR 2.39. She is off fluconazole, continued on amiodarone (dose is lower than PTA)   PTA dose 5 mg on TTSS, 2.5 mg on MWF.  Goal of Therapy:  INR 2-3  Plan:  1. Warfarin 2.5 mg x 1 dose today 2. Will continue to monitor for any signs/symptoms of bleeding and will follow up with PT/INR in the a.m.   Maryanna Shape, PharmD, BCPS  Clinical Pharmacist  Pager: [phone]  10/19/2014 9:03 AM

## 2014-10-19 NOTE — Progress Notes (Signed)
Patient is complaining of burning and pain at IV site.  No signs of infiltration at this time.  Dr. Karleen Hampshire notified and aware that IV is painful to patient.  Dr. Karleen Hampshire advises to start the IV potassium after the central line is placed.  Will continue to monitor.

## 2014-10-19 NOTE — Procedures (Signed)
Successful placement of left IJ approach tunneled 25 cm dual lumen PICC line with tip at the superior caval-atrial junction.  The PICC line is ready for immediate use.

## 2014-10-19 NOTE — Progress Notes (Signed)
Patient complaining of severe pain in legs and requests an extra dose of dilaudid.  Per patient report, IR attempted to give her dilaudid at 74 but the old IV site was leaking so she did not receive the full dose of pain medication.  Dr. Karleen Hampshire advises to give an extra one time dose of 1 mg of dialudid now.  Will continue to monitor.

## 2014-10-19 NOTE — Progress Notes (Signed)
Patient has santyl ordered for dressing change.  Patient refusing dressing change at this time and states hydrotherapy will be seeing her today.  Explained the order for dressing changes TID and patient continues to refuse.  Will continue to monitor.

## 2014-10-19 NOTE — Progress Notes (Signed)
Patient's sister requested to speak to the charge nurse so I went to the room to see her. Patient's sister requested that we not do any dressing changes tonight and that we not move patient's settings on her bed tonight. She said patient had therapy on her wounds that did not end until 4 pm and patient is very tired and would like to get some sleep tonight. I advised her I would let the patient's nurse know of their requests. I advised we would still need to give patient her medications and do vitals as ordered. She stated she understood.

## 2014-10-20 DIAGNOSIS — L03116 Cellulitis of left lower limb: Secondary | ICD-10-CM

## 2014-10-20 DIAGNOSIS — I10 Essential (primary) hypertension: Secondary | ICD-10-CM

## 2014-10-20 LAB — PROTIME-INR
INR: 2.5 — AB (ref 0.00–1.49)
Prothrombin Time: 27.2 seconds — ABNORMAL HIGH (ref 11.6–15.2)

## 2014-10-20 LAB — BASIC METABOLIC PANEL
ANION GAP: 14 (ref 5–15)
BUN: 85 mg/dL — ABNORMAL HIGH (ref 6–23)
CHLORIDE: 91 meq/L — AB (ref 96–112)
CO2: 33 meq/L — AB (ref 19–32)
CREATININE: 2.16 mg/dL — AB (ref 0.50–1.10)
Calcium: 7.9 mg/dL — ABNORMAL LOW (ref 8.4–10.5)
GFR calc Af Amer: 26 mL/min — ABNORMAL LOW (ref >90)
GFR calc non Af Amer: 23 mL/min — ABNORMAL LOW (ref >90)
Glucose, Bld: 138 mg/dL — ABNORMAL HIGH (ref 70–99)
Potassium: 3.6 mEq/L — ABNORMAL LOW (ref 3.7–5.3)
Sodium: 138 mEq/L (ref 137–147)

## 2014-10-20 LAB — GLUCOSE, CAPILLARY
GLUCOSE-CAPILLARY: 160 mg/dL — AB (ref 70–99)
GLUCOSE-CAPILLARY: 142 mg/dL — AB (ref 70–99)
Glucose-Capillary: 112 mg/dL — ABNORMAL HIGH (ref 70–99)
Glucose-Capillary: 179 mg/dL — ABNORMAL HIGH (ref 70–99)

## 2014-10-20 MED ORDER — HYDROMORPHONE HCL 2 MG PO TABS
1.0000 mg | ORAL_TABLET | ORAL | Status: DC | PRN
  Administered 2014-10-20 – 2014-10-26 (×8): 2 mg via ORAL
  Filled 2014-10-20 (×9): qty 1

## 2014-10-20 MED ORDER — WARFARIN SODIUM 2.5 MG PO TABS
2.5000 mg | ORAL_TABLET | Freq: Once | ORAL | Status: AC
  Administered 2014-10-20: 2.5 mg via ORAL
  Filled 2014-10-20: qty 1

## 2014-10-20 MED ORDER — POTASSIUM CHLORIDE CRYS ER 20 MEQ PO TBCR
40.0000 meq | EXTENDED_RELEASE_TABLET | Freq: Every day | ORAL | Status: DC
  Administered 2014-10-20 – 2014-10-26 (×7): 40 meq via ORAL
  Filled 2014-10-20 (×7): qty 2

## 2014-10-20 MED ORDER — HYDROMORPHONE HCL 1 MG/ML IJ SOLN: 1.0000 mg | INTRAMUSCULAR | Status: DC | PRN

## 2014-10-20 MED ORDER — OXYCODONE HCL ER 10 MG PO T12A
10.0000 mg | EXTENDED_RELEASE_TABLET | Freq: Two times a day (BID) | ORAL | Status: DC
  Administered 2014-10-20 – 2014-10-22 (×5): 10 mg via ORAL
  Filled 2014-10-20 (×5): qty 1

## 2014-10-20 NOTE — Progress Notes (Signed)
ANTICOAGULATION CONSULT NOTE - Follow Up Consult  Pharmacy Consult for Warfarin Indication: atrial fibrillation  Allergies  Allergen Reactions  . Ivp Dye [Iodinated Diagnostic Agents] Shortness Of Breath    Turn red, can't breathe  . Betadine [Povidone Iodine]   . Diphenhydramine Hcl Swelling    In hands and eyes  . Fish Allergy Nausea And Vomiting  . Iohexol      Desc: PT TURNS RED AND WHEEZING   . Penicillins Other (See Comments)    Resp arrest as child. Tolerates cephalosporins.  . Povidone-Iodine Other (See Comments)    Wheezing, turn red, can't breath, and blisters  . Promethazine Hcl Other (See Comments)    Low Blood Pressure  . Tape     Blisters, can use paper tape for short periods  . Clindamycin Hives and Rash    wheezing  . Iodine Rash  . Morphine Sulfate Nausea And Vomiting and Rash  . Primaxin [Imipenem] Rash    Patient Measurements: Height: 5' 6.93" (170 cm) Weight:  (unable to obtain) IBW/kg (Calculated) : 61.44  Vital Signs: Temp: 97.7 F (36.5 C) (10/21 0449) Temp Source: Oral (10/21 0449) BP: 116/61 mmHg (10/21 0449) Pulse Rate: 105 (10/21 0449)  Labs:  Recent Labs  10/18/14 0354 10/19/14 0454 10/19/14 1336 10/20/14 0700  HGB 8.4*  --   --   --   HCT 26.0*  --   --   --   PLT 278  --   --   --   LABPROT 23.3* 26.2*  --  27.2*  INR 2.06* 2.39*  --  2.50*  CREATININE 2.63*  --  2.24* 2.16*    Estimated Creatinine Clearance: 37 ml/min (by C-G formula based on Cr of 2.16).   Assessment: 64 yo female who continues on warfarin for hx Afib with a therapeutic INR 2.5. Hgb low, otherwise cbc stable. Will continue home regimen.   PTA dose 5 mg on TTSS, 2.5 mg on MWF.  Goal of Therapy:  INR 2-3  Plan:  1. Warfarin 2.5 mg x 1  2. Daily PT/INR   Hughes Better, PharmD, BCPS Clinical Pharmacist Pager: [phone] 10/20/2014 9:08 AM

## 2014-10-20 NOTE — Progress Notes (Signed)
Physical Therapy Treatment Patient Details Name: [name] MRN: [id number] DOB: 09/11/1950 Today's Date: 10/20/2014    History of Present Illness 64 y.o. female  with history of fibromyalgia, hypothyroidism, solitary kidney, hypothyroidism, diabetes, nonischemic cardiomyopathy with ejection fraction of 35%, atrial fibrillation who presented to the emergency department with left-sided lower leg pain. She was admitted on 9/10 w/ recurrent Lower extremity cellulitis. PCCM asked to see in consult on 9/11 for persistent hypotension and worsening renal failure.  She had a previous admission for LE cellulitis in 07/2014 followed by [facility].  Pt had returned home with HHPT prior to this admission.     PT Comments    Too painful to work on EOB activity, so settled for bed exercise.  Follow Up Recommendations  LTACH     Equipment Recommendations  None recommended by PT    Recommendations for Other Services       Precautions / Restrictions Precautions Precautions: Fall    Mobility  Bed Mobility                  Transfers                    Ambulation/Gait                 Stairs            Wheelchair Mobility    Modified Rankin (Stroke Patients Only)       Balance                                    Cognition Arousal/Alertness: Awake/alert Behavior During Therapy: Anxious Overall Cognitive Status: Within Functional Limits for tasks assessed                      Exercises General Exercises - Lower Extremity Hip ABduction/ADduction: AAROM;Strengthening;Both;10 reps;Supine Straight Leg Raises: AAROM;Strengthening;Both;10 reps (eccentric lowering of her legs.) Other Exercises Other Exercises: bicep and tricep presses not isolated to get shd work x10 resisted. Other Exercises: shd press to the ceiling with eccentric lowering under resistance x10 reps    General Comments        Pertinent Vitals/Pain Pain  Assessment: Faces Faces Pain Scale: Hurts even more Pain Location: bil legs and L neck and shd. Pain Descriptors / Indicators: Constant;Sharp;Aching Pain Intervention(s): Limited activity within patient's tolerance;Premedicated before session    Home Living                      Prior Function            PT Goals (current goals can now be found in the care plan section) Acute Rehab PT Goals Patient Stated Goal: To get out of this bed PT Goal Formulation: With patient Time For Goal Achievement: 10/27/14 Potential to Achieve Goals: Fair Progress towards PT goals: Progressing toward goals (so slowly)    Frequency  Min 2X/week    PT Plan Current plan remains appropriate    Co-evaluation             End of Session   Activity Tolerance: Patient limited by pain;Patient tolerated treatment well Patient left: in bed;with call bell/phone within reach     Time: 0950-1025 PT Time Calculation (min): 35 min  Charges:  $Therapeutic Exercise: 23-37 mins  G Codes:      [name], [name] 10/20/2014, 10:49 AM 10/20/2014  [name], [name] [phone]  (pager)

## 2014-10-20 NOTE — Progress Notes (Signed)
Pt refuses CPAP. RT available if needed.

## 2014-10-20 NOTE — Progress Notes (Signed)
Physical Therapy Wound Treatment Patient Details  Name: [name] MRN: [id number] Date of Birth: January 12, 1950  Today's Date: 10/20/2014 Time: 8938-1017 Time Calculation (min): 84 min  Subjective  Subjective: pt reports going to get a central line because iv not working well. Patient and Family Stated Goals: decr pain in wounds and heal without surgery  Pain Score: Pain Score: 3  (pre hydrotherapy)  Wound Assessment  Pressure Ulcer 09/10/14 Stage III -  Full thickness tissue loss. Subcutaneous fat may be visible but bone, tendon or muscle are NOT exposed. 3.5 X 2 (Active)  Dressing Type ABD 10/20/2014  8:07 AM  Dressing Changed 10/09/2014  3:30 AM  Dressing Change Frequency Other (Comment) 10/20/2014  8:07 AM  State of Healing Early/partial granulation 09/26/2014 11:00 AM  Site / Wound Assessment Bleeding 09/29/2014  8:00 PM  % Wound base Red or Granulating 100% 09/29/2014  8:00 PM  % Wound base Yellow 75% 09/26/2014 11:00 AM  % Wound base Black 0% 09/26/2014 11:00 AM  % Wound base Other (Comment) 0% 09/26/2014 11:00 AM  Peri-wound Assessment Intact;Pink 09/26/2014 11:00 AM  Wound Length (cm) 3.5 cm 09/11/2014  8:00 AM  Wound Width (cm) 2 cm 09/11/2014  8:00 AM  Margins Unattached edges (unapproximated) 10/20/2014  8:07 AM  Drainage Amount Copious 10/07/2014  5:30 AM  Drainage Description Sanguineous 10/07/2014  5:30 AM  Treatment Cleansed;Other (Comment) 10/01/2014  9:30 AM     Pressure Ulcer 09/20/14 Deep Tissue Injury - Purple or maroon localized area of discolored intact skin or blood-filled blister due to damage of underlying soft tissue from pressure and/or shear. (Active)  Dressing Type ABD 10/20/2014  8:07 AM  Dressing Clean;Dry;Intact 10/20/2014  8:07 AM  Dressing Change Frequency Other (Comment) 10/20/2014  8:07 AM  State of Healing Non-healing 09/29/2014  8:04 PM  % Wound base Red or Granulating 90% 09/29/2014  8:04 PM  % Wound base Yellow 10% 09/29/2014  8:04 PM  Margins  Unattached edges (unapproximated) 09/29/2014  8:04 PM  Drainage Amount Scant 09/29/2014  8:04 PM  Drainage Description Serosanguineous 09/29/2014  8:04 PM  Treatment Cleansed;Other (Comment) 09/29/2014  8:04 PM     Pressure Ulcer 09/20/14 Deep Tissue Injury - Purple or maroon localized area of discolored intact skin or blood-filled blister due to damage of underlying soft tissue from pressure and/or shear. (Active)  Dressing Type Gauze (Comment);ABD;Mesh briefs;Other (Comment) 10/20/2014  3:13 PM  Dressing Changed 10/20/2014  3:13 PM  Dressing Change Frequency Other (Comment) 10/20/2014  3:13 PM  State of Healing Eschar 10/20/2014  3:13 PM  Site / Wound Assessment Yellow;Pink;Painful 10/20/2014  3:13 PM  % Wound base Red or Granulating 10% 10/20/2014  3:13 PM  % Wound base Yellow 85% 10/20/2014  3:13 PM  % Wound base Black 5% 10/20/2014  3:13 PM  % Wound base Other (Comment) 0% 10/20/2014  3:13 PM  Peri-wound Assessment Induration;Erythema (blanchable) 10/20/2014  3:13 PM  Wound Length (cm) 4.5 cm 10/14/2014  4:06 PM  Wound Width (cm) 5.6 cm 10/14/2014  4:06 PM  Wound Depth (cm) 2 cm 10/14/2014  4:06 PM  Margins Unattached edges (unapproximated) 10/20/2014  3:13 PM  Drainage Amount Moderate 10/20/2014  3:13 PM  Drainage Description Serosanguineous;Odor 10/20/2014  3:13 PM  Treatment Cleansed;Debridement (Selective);Hydrotherapy (Pulse lavage);Packing (Dry gauze) 10/20/2014  3:13 PM     Wound 06/02/13 Abrasion(s) Leg Left (Active)  Dressing Type ABD;Gauze (Comment);Mesh briefs 10/20/2014  8:07 AM  Dressing Changed Changed 10/13/2014 11:45 PM  Dressing Status Clean;Dry;Intact  10/15/2014  3:28 PM  Dressing Change Frequency Daily 10/15/2014  3:28 PM  Site / Wound Assessment Red 10/15/2014  3:28 PM  % Wound base Red or Granulating 100% 10/15/2014  3:28 PM  % Wound base Yellow 0% 10/15/2014  3:28 PM  % Wound base Black 0% 10/15/2014  3:28 PM  % Wound base Other (Comment) 0% 10/15/2014  3:28 PM   Peri-wound Assessment Intact;Edema;Erythema (blanchable) 10/15/2014  3:28 PM  Margins Unattached edges (unapproximated) 10/15/2014  3:28 PM  Closure None 10/15/2014  3:28 PM  Drainage Amount Moderate 10/15/2014  3:28 PM  Drainage Description Serous 10/15/2014  3:28 PM  Non-staged Wound Description Full thickness 10/15/2014  3:28 PM  Treatment Hydrotherapy (Pulse lavage) 10/15/2014  3:28 PM     Wound / Incision (Open or Dehisced) 07/24/14 Incision - Open Leg Left (Active)     Wound / Incision (Open or Dehisced) 07/24/14 Incision - Open Thigh Right scabbed over circular area (Active)     Wound / Incision (Open or Dehisced) 07/29/14 Other (Comment) Thigh Left;Medial HYDRO (Active)     Wound / Incision (Open or Dehisced) 08/02/14 Other (Comment) Thigh Right;Medial Hydro, R medial thigh wound (Active)     Wound / Incision (Open or Dehisced) 09/10/14 Other (Comment) Thigh Medial;Left;Posterior;Proximal (Active)  Dressing Type Gauze (Comment);ABD;Mesh briefs 10/20/2014  3:13 PM  Dressing Changed Changed 10/19/2014 12:58 PM  Dressing Status Clean;Dry;Intact 10/20/2014  3:13 PM  Dressing Change Frequency Other (Comment) 10/20/2014  3:13 PM  Site / Wound Assessment Yellow;Black;Painful;Pink 10/20/2014  3:13 PM  % Wound base Red or Granulating 10% 10/20/2014  3:13 PM  % Wound base Yellow 90% 10/20/2014  3:13 PM  % Wound base Black 0% 10/20/2014  3:13 PM  % Wound base Other (Comment) 0% 10/20/2014  3:13 PM  Peri-wound Assessment Induration;Edema 10/20/2014  3:13 PM  Wound Length (cm) 8 cm 10/14/2014  4:06 PM  Wound Width (cm) 8.5 cm 10/14/2014  4:06 PM  Wound Depth (cm) 4.5 cm 10/14/2014  4:06 PM  Margins Unattached edges (unapproximated) 10/20/2014  3:13 PM  Closure None 10/20/2014  3:13 PM  Drainage Amount Moderate 10/20/2014  3:13 PM  Drainage Description Serosanguineous;Odor 10/20/2014  3:13 PM  Non-staged Wound Description Full thickness 10/20/2014  3:13 PM  Treatment  Cleansed;Debridement (Selective);Hydrotherapy (Pulse lavage);Packing (Dry gauze) 10/20/2014  3:13 PM     Wound / Incision (Open or Dehisced) 09/18/14 Other (Comment) Hip Right Black eschar tissue surrounded by pink tissue (Active)  Dressing Type Gauze (Comment);ABD;Mesh briefs 10/20/2014  3:13 PM  Dressing Changed Changed 10/20/2014  3:13 PM  Dressing Status Clean;Dry;Intact 10/20/2014  3:13 PM  Dressing Change Frequency Other (Comment) 10/20/2014  8:07 AM  Site / Wound Assessment Yellow;Red;Painful;Black 10/20/2014  3:13 PM  % Wound base Red or Granulating 10% 10/20/2014  3:13 PM  % Wound base Yellow 70% 10/20/2014  3:13 PM  % Wound base Black 20% 10/20/2014  3:13 PM  % Wound base Other (Comment) 0% 10/20/2014  3:13 PM  Peri-wound Assessment Edema;Induration 10/20/2014  3:13 PM  Wound Length (cm) 9.4 cm 10/15/2014  3:28 PM  Wound Width (cm) 8 cm 10/15/2014  3:28 PM  Wound Depth (cm) 1.5 cm 10/15/2014  3:28 PM  Margins Unattached edges (unapproximated) 10/16/2014 10:57 AM  Closure None 10/16/2014 10:57 AM  Drainage Amount Copious 10/20/2014  3:13 PM  Drainage Description Serosanguineous;Odor 10/20/2014  3:13 PM  Non-staged Wound Description Full thickness 10/20/2014  3:13 PM  Treatment Cleansed;Debridement (Selective);Hydrotherapy (Pulse lavage);Packing (Dry gauze) 10/20/2014  3:13 PM     Incision 05/05/12 Perineum Other (Comment) (Active)   Hydrotherapy Pulsed lavage therapy - wound location: Rt lateral hip, Lt lateral thigh, Lt proximal-posteriomedial thigh Pulsed Lavage with Suction (psi): 8 psi Pulsed Lavage with Suction - Normal Saline Used: 2000 mL Pulsed Lavage Tip: Tip with splash shield Selective Debridement Selective Debridement - Location: Rt lateral hip, Lt lateral thigh, Lt proximal-posteriomedial thigh Selective Debridement - Tools Used: Forceps;Scalpel;Scissors Selective Debridement - Tissue Removed: yellow/brown eschar, yellow slough, fatty tissue   Wound Assessment  and Plan  Wound Therapy - Assess/Plan/Recommendations Wound Therapy - Clinical Statement: Pt making very slow progress with hydrotherapy due to not able to give her enough meds to handle the pain of debridement. Wound Therapy - Functional Problem List: painful wounds limiting her mobility tolerance Factors Delaying/Impairing Wound Healing: Diabetes Mellitus;Infection - systemic/local;Immobility;Multiple medical problems;Vascular compromise Hydrotherapy Plan: Debridement;Dressing change;Patient/family education;Pulsatile lavage with suction Wound Therapy - Frequency: 6X / week Wound Therapy - Follow Up Recommendations: Skilled nursing facility Wound Plan: see above  Wound Therapy Goals- Improve the function of patient's integumentary system by progressing the wound(s) through the phases of wound healing (inflammation - proliferation - remodeling) by: Decrease Necrotic Tissue to: <75% Decrease Necrotic Tissue - Progress: Progressing toward goal Increase Granulation Tissue to: >25% Increase Granulation Tissue - Progress: Progressing toward goal Improve Drainage Characteristics: Min Improve Drainage Characteristics - Progress: Not progressing Goals/treatment plan/discharge plan were made with and agreed upon by patient/family: Yes Time For Goal Achievement: 7 days Wound Therapy - Potential for Goals: Good  Goals will be updated until maximal potential achieved or discharge criteria met.  Discharge criteria: when goals achieved, discharge from hospital, MD decision/surgical intervention, no progress towards goals, refusal/missing three consecutive treatments without notification or medical reason.  GP     Chanice Brenton, Tessie Fass 10/20/2014, 3:19 PM  10/20/2014  Donnella Sham, PT [phone]  (pager)

## 2014-10-20 NOTE — Progress Notes (Signed)
PROGRESS NOTE    [name] OVZ:[id number] DOB: April 13, 1950 DOA: 09/09/2014  PCP: Elby Showers, MD  HPI/Brief narrative 64 y.o. female with history of Morbid obesity, CKD 4, fibromyalgia, hypothyroidism, solitary kidney, hypothyroidism, diabetes, nonischemic cardiomyopathy with ejection fraction of 35%, atrial fibrillation who was admitted on 9/10 w/ recurrent Lower extremity cellulitis, immediately went into Septic shock- was in ICU on pressors until 9/14. Subsequently transferred to the Floor. Has completed antibiotics. Undergoing wound care/hydrotherapy per PT under [facility] supervision. Cardiology was following as well. Palliative care team consulted for goals of care. Patient is trying to change her insurance so she will be eligible for LTAC. Discharge disposition remains unclear.   Assessment/Plan:  1. Septic shock: Due to bilateral leg cellulitis. Required vasopressors in ICU. Resolved.  2. Multiple bilateral lower extremity necrotic wounds: Completed antibiotic course. Surgery & WOC followup appreciated. Continue hydrotherapy with PT. She will need OP follow up with plastic surgery. Significant pain during wound care- much better controlled with Dilaudid 2 mg IV x1 prior to dressing changes. Management per surgery. Patient has ATC pain. She might benefit from being on long acting pain medications. Will initiate Oxycontin. Oral Dilaudid for breakthrough.  3. Acute on chronic combined systolic and diastolic CHF/anasarca: Difficult to assess volume status despite large volume diuresis. Weights have been inaccurate and not regularly recorded. Cardiology switched her to oral diuretics-torsemide and metolazone. We have held metolazone for increasing creatinine. We restarted the metolazone back on 10/20. Marked hypoalbuminemia also contributing to anasarca. Swelling of bilateral legs probably from chronic venous stasis. Cardiology signed off. Patient continues to improve. Please call the [facility]  office when she is ready for discharge and cardiology will arrange a f/u appt with Dr Sallyanne Kuster or his PA/NP.    4. A. Fib/s/p PPM: Controlled ventricular rate. Coumadin per pharmacy.  5. Hypotension: resolved 6. 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 Continue Lantus, NovoLog mealtime and SSI. 7. Hypothyroid: Continue levothyroxine. Recent TSH 2 on 05/29/1511 8. History of gout: Stable  9. History of OHS/morbid obesity: Nightly CPAP 10. Tremors: Gabapentin discontinued. Seems to have resolved.  11. Acute on stage IV chronic kidney disease: Baseline creatinine probably in the low 2 range. Creatinine gradually improving. Follow BMP periodically. 12. RUE swelling: Dopplers negative for DVT. Likely from anasarca. 13. Anemia: Secondary to chronic kidney disease, acute illness and iron deficiency: Stable 14. Hypokalemia:  Will be Replaced. Repeat level in am.  15. Fecal impaction/Constipation: Significant relief after digital disimpaction and enema 10/13. She wishes to continue Colace but declines MiraLAX. Likely multifactorial from immobility, pain medications, poor diet and iron supplements. 16. Fungal rash (of back and back of arms): Seems extensive and less likely to improve from topical nystatin. Brief course of by mouth fluconazole-DC ed after 5 days. 17.  UTI: urine cultures ordered and started on rocephin. Urine cultures show klebsiella sensitive for rocephin. She will have completed 7 days of antibiotics on 10/20 18. Prolonged QTC: Try to keep potassium greater than >4 and magnesium >2. Her QTC was improving.   DVT Prophylaxis: On warfarin Code Status: Full Family Communication: Discussed with patient. No family at bedside today.  Disposition Plan: Unclear disposition. Medically she is stable. CM and CSW working with patient and family.   Consultants:  General surgery   Cardiology  Critical care medicine-signed off  Nephrology-signed  off  [place] - pending-discussed with the palliative care team and will try to arrange meeting today.  Procedures:  Central venous catheter insertion procedure on 09/10/2014   Hydrotherapy  IJ PICC line 10/20   Antibiotics:  Completed all antibiotics 9/23   Subjective: Has continuous pain in her lower extremities. 5/10 at baseline and increases to 10/10 when being moved.   Objective: Filed Vitals:   10/19/14 1815 10/19/14 1845 10/19/14 2008 10/20/14 0449  BP: 117/66 112/72 115/62 116/61  Pulse: 80 81 83 105  Temp: 98 F (36.7 C)  97.5 F (36.4 C) 97.7 F (36.5 C)  TempSrc: Oral  Oral Oral  Resp:   18 18  Height:      Weight:      SpO2: 100% 100% 100% 96%    Intake/Output Summary (Last 24 hours) at 10/20/14 1020 Last data filed at 10/20/14 0854  Gross per 24 hour  Intake    120 ml  Output   3300 ml  Net  -3180 ml   Filed Weights   10/08/14 0423  Weight: 136.533 kg (301 lb)     Exam:  General exam: morbidly obese female lying comfortably propped up in bed.  Respiratory system: Clear to auscultation. No increased work of breathing. Cardiovascular system: S1 & S2 heard, RRR. No JVD, murmurs, gallops, clicks. 2+ pitting bilateral leg edema/anasarca-leg edema appears chronic. Telemetry: A. fib with ventricular rate in the 90s. Gastrointestinal system: Abdomen is nondistended, soft and nontender. Normal bowel sounds heard. Central nervous system: Alert and oriented. No focal neurological deficits. Extremities: Symmetric 5 x 5 power. Multiple bilateral lower extremity wounds-details as per daily PT note. Chronic and patchy mild bilateral lower leg faint erythema-stable. Skin: Extensive patchy redness of back and back of bilateral arms suggestive of fungal infection- seem much better.  Data Reviewed: Basic Metabolic Panel:  Recent Labs Lab 10/14/14 1155  10/16/14 0405 10/17/14 0415 10/18/14 0354 10/19/14 1336 10/20/14 0700  NA 135*  < > 133*  133* 134* 136* 138  K 3.6*  < > 4.3 4.0 3.3* 2.8* 3.6*  CL 90*  < > 90* 89* 88* 89* 91*  CO2 33*  < > 32 32 34* 34* 33*  GLUCOSE 138*  < > 98 150* 125* 151* 138*  BUN 93*  < > 94* 96* 98* 87* 85*  CREATININE 2.27*  < > 2.56* 2.72* 2.63* 2.24* 2.16*  CALCIUM 7.8*  < > 7.8* 7.8* 7.7* 7.7* 7.9*  MG 1.8  --   --   --   --  1.7  --   < > = values in this interval not displayed.  CBC:  Recent Labs Lab 10/18/14 0354  WBC 11.9*  HGB 8.4*  HCT 26.0*  MCV 97.7  PLT 278   BNP (last 3 results)  Recent Labs  09/30/14 0520  PROBNP 8120.0*   CBG:  Recent Labs Lab 10/19/14 1309 10/19/14 1606 10/19/14 1807 10/19/14 2113 10/20/14 0618  GLUCAP 149* 121* 141* 168* 160*    Recent Results (from the past 240 hour(s))  URINE CULTURE     Status: None   Collection Time    10/13/14  2:52 PM      Result Value Ref Range Status   Specimen Description URINE, RANDOM   Final   Special Requests ADDED [id number]   Final   Culture  Setup Time     Final   Value: 10/13/2014 22:08     Performed at [facility]     Final   Value: >=100,000 COLONIES/ML     Performed at [facility]   Culture     Final   Value: KLEBSIELLA PNEUMONIAE     Performed at [facility]   Report Status 10/16/2014 FINAL   Final   Organism ID, Bacteria KLEBSIELLA PNEUMONIAE   Final  MRSA PCR SCREENING     Status: None   Collection Time    10/18/14  9:12 AM      Result Value Ref Range Status   MRSA by PCR NEGATIVE  NEGATIVE Final   Comment:            The GeneXpert MRSA Assay (FDA     approved for NASAL specimens     only), is one component of a     comprehensive MRSA colonization     surveillance program. It is not     intended to diagnose MRSA     infection nor to guide or     monitor treatment for     MRSA infections.        Studies: Ir Fluoro Guide Cv Line Right  10/19/2014   INDICATION: Poor venous access. In need of intravenous access for medication  administration and blood draws.  EXAM: TUNNELED PICC PLACEMENT WITH ULTRASOUND AND FLUOROSCOPIC GUIDANCE  MEDICATIONS: The patient is currently admitted to the hospital and receiving intravenous antibiotics. The IV antibiotic was given in an appropriate time interval prior to skin puncture.  CONTRAST:  None  ANESTHESIA/SEDATION: None  FLUOROSCOPY TIME:  54 seconds.  COMPLICATIONS: None immediate  PROCEDURE: Informed written consent was obtained from the patient after a discussion of the risks, benefits, and alternatives to treatment. Questions regarding the procedure were encouraged and answered. 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 The left neck and chest were prepped with chlorhexidine in a sterile fashion, and a sterile drape was applied covering the operative field. Maximum barrier sterile technique with sterile gowns and gloves were used for the procedure. A timeout was performed prior to the initiation of the procedure.  After creating a small venotomy incision, a micropuncture kit was utilized to access the left internal jugular vein under direct, real-time ultrasound guidance after the overlying soft tissues were anesthetized with 1% lidocaine with epinephrine. Ultrasound image documentation was performed. The microwire was kinked to measure appropriate catheter length. A 25 cm tunneled double lumen PICC line was tunneled in a retrograde fashion from the anterior chest wall to the venotomy incision.  The catheter was then placed through the peel-away sheath with tips ultimately positioned within the superior cavoatrial junction. Final catheter positioning was confirmed and documented with a spot radiographic image. The catheter aspirates and flushes normally.  The catheter exit site was secured with a 2-0 Prolene retention suture. The venotomy incision was closed with Dermabond and  Steri-strips. Dressings were applied. The patient tolerated the procedure well without immediate post procedural complication.  IMPRESSION: Successful placement of 25 cm tip to cuff tunneled PICC via the left internal jugular vein with tips terminating within the superior cavoatrial junction. The catheter is ready for immediate use.   Electronically Signed   By: Sandi Mariscal M.D.   On: 10/19/2014 17:51   Ir US Guide Vasc Access Left  10/19/2014   INDICATION: Poor venous access. In need of intravenous access for medication administration and blood draws.  EXAM: TUNNELED PICC PLACEMENT WITH ULTRASOUND AND FLUOROSCOPIC GUIDANCE  MEDICATIONS: The patient is currently admitted to the hospital and  receiving intravenous antibiotics. The IV antibiotic was given in an appropriate time interval prior to skin puncture.  CONTRAST:  None  ANESTHESIA/SEDATION: None  FLUOROSCOPY TIME:  54 seconds.  COMPLICATIONS: None immediate  PROCEDURE: Informed written consent was obtained from the patient after a discussion of the risks, benefits, and alternatives to treatment. Questions regarding the procedure were encouraged and answered. 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 The left neck and chest were prepped with chlorhexidine in a sterile fashion, and a sterile drape was applied covering the operative field. Maximum barrier sterile technique with sterile gowns and gloves were used for the procedure. A timeout was performed prior to the initiation of the procedure.  After creating a small venotomy incision, a micropuncture kit was utilized to access the left internal jugular vein under direct, real-time ultrasound guidance after the overlying soft tissues were anesthetized with 1% lidocaine with epinephrine. Ultrasound image documentation was performed. The microwire was kinked to measure appropriate catheter length.  A 25 cm tunneled double lumen PICC line was tunneled in a retrograde fashion from the anterior chest wall to the venotomy incision.  The catheter was then placed through the peel-away sheath with tips ultimately positioned within the superior cavoatrial junction. Final catheter positioning was confirmed and documented with a spot radiographic image. The catheter aspirates and flushes normally.  The catheter exit site was secured with a 2-0 Prolene retention suture. The venotomy incision was closed with Dermabond and Steri-strips. Dressings were applied. The patient tolerated the procedure well without immediate post procedural complication.  IMPRESSION: Successful placement of 25 cm tip to cuff tunneled PICC via the left internal jugular vein with tips terminating within the superior cavoatrial junction. The catheter is ready for immediate use.   Electronically Signed   By: Sandi Mariscal M.D.   On: 10/19/2014 17:51     Scheduled Meds: . amiodarone  400 mg Oral Daily  . calcitRIOL  0.25 mcg Oral BID  . calcium-vitamin D  1 tablet Oral BID  . collagenase   Topical TID  . febuxostat  80 mg Oral Daily  .  HYDROmorphone (DILAUDID) injection  2 mg Intravenous Daily  . insulin aspart  0-15 Units Subcutaneous TID WC  . insulin aspart  0-5 Units Subcutaneous QHS  . insulin aspart  2 Units Subcutaneous TID WC  . insulin glargine  22 Units Subcutaneous QHS  . iron polysaccharides  150 mg Oral BID  . levothyroxine  125 mcg Oral QAC breakfast  . linagliptin  5 mg Oral Daily  . metolazone  2.5 mg Oral QODAY  . multivitamin with minerals  1 tablet Oral Daily  . OxyCODONE  10 mg Oral Q12H  . potassium chloride  40 mEq Oral Daily  . saccharomyces boulardii  250 mg Oral BID  . senna  2 tablet Oral BID  . silver nitrate applicators  1 application Topical Once  . sodium chloride  3 mL Intravenous Q12H  . torsemide  60 mg Oral BID  . warfarin  2.5 mg Oral ONCE-1800  . 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   Obesity hypoventilation syndrome-    Cardiomyopathy, nonischemic- EF  30-35% June 2014   Type 2 diabetes mellitus   Chronic combined systolic and diastolic CHF   Long term (current) use of anticoagulants   CKD (chronic kidney disease) stage 4, GFR 15-29 ml/min   PAF- recurrent despite Amiodarone and multiple cardioversions   Mitral insufficiency, moderate to severe   Chronic massive bilat LE lymphadema   Fall at home- 3 falls this summer, sound orthostatic   Hypotension   Acute on chronic renal failure   Coarse tremors   DNR (do not resuscitate) discussion   Palliative care encounter   Weakness generalized   UTI (urinary tract infection)    Time spent: 40 minutes.    Bonnielee Haff, MD, [facility] Pager [phone]  If 7PM-7AM, please contact night-coverage [email or web address] Password [email or web address]  10/20/2014, 10:20 AM    LOS: 41 days

## 2014-10-21 DIAGNOSIS — L03119 Cellulitis of unspecified part of limb: Secondary | ICD-10-CM

## 2014-10-21 LAB — PROTIME-INR
INR: 2.79 — AB (ref 0.00–1.49)
Prothrombin Time: 29.7 seconds — ABNORMAL HIGH (ref 11.6–15.2)

## 2014-10-21 LAB — GLUCOSE, CAPILLARY
GLUCOSE-CAPILLARY: 158 mg/dL — AB (ref 70–99)
Glucose-Capillary: 162 mg/dL — ABNORMAL HIGH (ref 70–99)
Glucose-Capillary: 209 mg/dL — ABNORMAL HIGH (ref 70–99)
Glucose-Capillary: 161 mg/dL — ABNORMAL HIGH (ref 70–99)

## 2014-10-21 LAB — BASIC METABOLIC PANEL
ANION GAP: 10 (ref 5–15)
BUN: 81 mg/dL — ABNORMAL HIGH (ref 6–23)
CHLORIDE: 90 meq/L — AB (ref 96–112)
CO2: 35 mEq/L — ABNORMAL HIGH (ref 19–32)
CREATININE: 2.07 mg/dL — AB (ref 0.50–1.10)
Calcium: 7.8 mg/dL — ABNORMAL LOW (ref 8.4–10.5)
GFR calc Af Amer: 28 mL/min — ABNORMAL LOW (ref >90)
GFR calc non Af Amer: 24 mL/min — ABNORMAL LOW (ref >90)
GLUCOSE: 148 mg/dL — AB (ref 70–99)
Potassium: 3.2 mEq/L — ABNORMAL LOW (ref 3.7–5.3)
Sodium: 135 mEq/L — ABNORMAL LOW (ref 137–147)

## 2014-10-21 MED ORDER — WARFARIN SODIUM 1 MG PO TABS
1.0000 mg | ORAL_TABLET | Freq: Once | ORAL | Status: AC
  Administered 2014-10-21: 1 mg via ORAL
  Filled 2014-10-21: qty 1

## 2014-10-21 MED ORDER — DOCUSATE SODIUM 100 MG PO CAPS
100.0000 mg | ORAL_CAPSULE | Freq: Two times a day (BID) | ORAL | Status: DC
  Administered 2014-10-21 – 2014-10-25 (×9): 100 mg via ORAL
  Filled 2014-10-21 (×12): qty 1

## 2014-10-21 MED ORDER — HYDROMORPHONE HCL 1 MG/ML IJ SOLN
1.0000 mg | Freq: Once | INTRAMUSCULAR | Status: AC
Start: 2014-10-21 — End: 2014-10-22
  Administered 2014-10-22: 1 mg via INTRAVENOUS
  Filled 2014-10-21: qty 1

## 2014-10-21 MED ORDER — SENNA 8.6 MG PO TABS
2.0000 | ORAL_TABLET | Freq: Every day | ORAL | Status: DC | PRN
Start: 2014-10-21 — End: 2014-10-26
  Administered 2014-10-21: 17.2 mg via ORAL
  Administered 2014-10-23: 8.6 mg via ORAL
  Administered 2014-10-23 – 2014-10-24 (×2): 17.2 mg via ORAL
  Administered 2014-10-25: 8.6 mg via ORAL
  Filled 2014-10-21 (×3): qty 2

## 2014-10-21 MED ORDER — POLYETHYLENE GLYCOL 3350 17 G PO PACK
17.0000 g | PACK | Freq: Every day | ORAL | Status: DC | PRN
  Administered 2014-10-25: 17 g via ORAL
  Filled 2014-10-21: qty 1

## 2014-10-21 NOTE — Progress Notes (Signed)
Dressings on Right and Left lateral thighs changed this am.  Patient declined to be turned to have her pads changed or the dressings changed on inner thighs and Left Posterior thigh. Patient informed that the pads were wet with serous drainage and strongly advised to allow the nursing staff to change these pads.  She requested that a dry pad be placed over the wet one which was done.  Patient stated: " I just want to turn one good time for my bath and dressings this morning."

## 2014-10-21 NOTE — Progress Notes (Addendum)
NUTRITION FOLLOW UP  INTERVENTION:  Continue snacks TID  Continue MVI daily RD to follow for nutrition care plan  NUTRITION DIAGNOSIS: Increased nutrient needs related to wound healing as evidenced by estimated nutrition needs, ongoing  Goal: Pt to meet >/= 90% of their estimated nutrition needs, progressing  Monitor:  PO intake, weight, labs, I/O's  ASSESSMENT: 64 y.o. Female with history of fibromyalgia, hypothyroidism, solitary kidney, hypothyroidism, diabetes, nonischemic cardiomyopathy with ejection fraction of 35%, atrial fibrillation who presented to ED with left-sided lower leg pain.  Goals of care meeting with Palliative Care Team 10/14.  Pt is a limited code, she does not desire TF.    Wounds receiving hydrotherapy.  No plans for surgical debridement.  Pt's PO intake remains variable at 20-50% per flowsheet records.  Receiving MVI daily and snacks TID.  Height: Ht Readings from Last 1 Encounters:  09/09/14 5' 6.93" (1.7 m)    Weight: Wt Readings from Last 1 Encounters:  09/02/14 304 lb (137.893 kg)    BMI:  Body mass index is 47.24 kg/(m^2).  Estimated Nutritional Needs: Kcal: 1700-1900 Protein: 100-110 gm Fluid: 1.7-1.9 L  Skin:  Left thigh unstageable wound Left posterior thigh unstageable wound Right hip unstageable wound Left outer calf with partial thickness stasis ulcers Stage II left buttock wound  Diet Order: Heart Healthy/Carbohydrate Modified    Intake/Output Summary (Last 24 hours) at 10/21/14 1451 Last data filed at 10/21/14 0900  Gross per 24 hour  Intake    360 ml  Output   1600 ml  Net  -1240 ml     Labs:   Recent Labs Lab 10/19/14 1336 10/20/14 0700 10/21/14 0525  NA 136* 138 135*  K 2.8* 3.6* 3.2*  CL 89* 91* 90*  CO2 34* 33* 35*  BUN 87* 85* 81*  CREATININE 2.24* 2.16* 2.07*  CALCIUM 7.7* 7.9* 7.8*  MG 1.7  --   --   GLUCOSE 151* 138* 148*    CBG (last 3)   Recent Labs  10/20/14 2156 10/21/14 0638  10/21/14 1111  GLUCAP 179* 162* 158*    Scheduled Meds: . amiodarone  400 mg Oral Daily  . calcitRIOL  0.25 mcg Oral BID  . calcium-vitamin D  1 tablet Oral BID  . collagenase   Topical TID  . docusate sodium  100 mg Oral BID  . febuxostat  80 mg Oral Daily  .  HYDROmorphone (DILAUDID) injection  2 mg Intravenous Daily  . insulin aspart  0-15 Units Subcutaneous TID WC  . insulin aspart  0-5 Units Subcutaneous QHS  . insulin aspart  2 Units Subcutaneous TID WC  . insulin glargine  22 Units Subcutaneous QHS  . iron polysaccharides  150 mg Oral BID  . levothyroxine  125 mcg Oral QAC breakfast  . linagliptin  5 mg Oral Daily  . metolazone  2.5 mg Oral QODAY  . multivitamin with minerals  1 tablet Oral Daily  . OxyCODONE  10 mg Oral Q12H  . potassium chloride  40 mEq Oral Daily  . saccharomyces boulardii  250 mg Oral BID  . silver nitrate applicators  1 application Topical Once  . sodium chloride  3 mL Intravenous Q12H  . torsemide  60 mg Oral BID  . warfarin  1 mg Oral ONCE-1800  . Warfarin - Pharmacist Dosing Inpatient   Does not apply q1800    Continuous Infusions:   Arthur Holms, RD, LDN Pager #: [phone] After-Hours Pager #: [phone]

## 2014-10-21 NOTE — Progress Notes (Signed)
Patient ID: [name], female   DOB: 15-Dec-1950, 64 y.o.   MRN: [id number]    Subjective: Getting a bath  Objective: Vital signs in last 24 hours: Temp:  [98 F (36.7 C)-98.7 F (37.1 C)] 98.7 F (37.1 C) (10/22 0430) Pulse Rate:  [67-106] 106 (10/22 0430) Resp:  [17-19] 17 (10/22 0430) BP: (102)/(48-52) 102/52 mmHg (10/22 0430) SpO2:  [92 %-97 %] 97 % (10/22 0430) Last BM Date: 10/18/14 (Patient report)  Intake/Output from previous day: 10/21 0701 - 10/22 0700 In: 360 [P.O.:360] Out: 2100 [Urine:2100] Intake/Output this shift: Total I/O In: 240 [P.O.:240] Out: -   PE: Skin: bilateral outer thigh wounds are still not clean, but are making some improvements.  Some pink tissue is slightly visible which is the first time in weeks.  Lab Results:  No results found for this basename: WBC, HGB, HCT, PLT,  in the last 72 hours BMET  Recent Labs  10/20/14 0700 10/21/14 0525  NA 138 135*  K 3.6* 3.2*  CL 91* 90*  CO2 33* 35*  GLUCOSE 138* 148*  BUN 85* 81*  CREATININE 2.16* 2.07*  CALCIUM 7.9* 7.8*   PT/INR  Recent Labs  10/20/14 0700 10/21/14 0525  LABPROT 27.2* 29.7*  INR 2.50* 2.79*   CMP     Component Value Date/Time   NA 135* 10/21/2014 0525   K 3.2* 10/21/2014 0525   CL 90* 10/21/2014 0525   CO2 35* 10/21/2014 0525   GLUCOSE 148* 10/21/2014 0525   BUN 81* 10/21/2014 0525   CREATININE 2.07* 10/21/2014 0525   CREATININE 2.10* 05/28/2014 0001   CREATININE 1.28* 02/12/2011 1000   CALCIUM 7.8* 10/21/2014 0525   CALCIUM 9.5 03/24/2012 1212   PROT 6.2 10/12/2014 0510   ALBUMIN 1.4* 10/12/2014 0510   AST 32 10/12/2014 0510   ALT 17 10/12/2014 0510   ALKPHOS 149* 10/12/2014 0510   BILITOT 1.1 10/12/2014 0510   GFRNONAA 24* 10/21/2014 0525   GFRAA 28* 10/21/2014 0525   Lipase  No results found for this basename: lipase       Studies/Results: Ir Fluoro Guide Cv Line Right  10/19/2014   INDICATION: Poor venous access. In need of intravenous  access for medication administration and blood draws.  EXAM: TUNNELED PICC PLACEMENT WITH ULTRASOUND AND FLUOROSCOPIC GUIDANCE  MEDICATIONS: The patient is currently admitted to the hospital and receiving intravenous antibiotics. The IV antibiotic was given in an appropriate time interval prior to skin puncture.  CONTRAST:  None  ANESTHESIA/SEDATION: None  FLUOROSCOPY TIME:  54 seconds.  COMPLICATIONS: None immediate  PROCEDURE: Informed written consent was obtained from the patient after a discussion of the risks, benefits, and alternatives to treatment. Questions regarding the procedure were encouraged and answered. 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 The left neck and chest were prepped with chlorhexidine in a sterile fashion, and a sterile drape was applied covering the operative field. Maximum barrier sterile technique with sterile gowns and gloves were used for the procedure. A timeout was performed prior to the initiation of the procedure.  After creating a small venotomy incision, a micropuncture kit was utilized to access the left internal jugular vein under direct, real-time ultrasound guidance after the overlying soft tissues were anesthetized with 1% lidocaine with epinephrine. Ultrasound image documentation was performed. The microwire was kinked to measure appropriate catheter length. A 25 cm tunneled double lumen PICC line  was tunneled in a retrograde fashion from the anterior chest wall to the venotomy incision.  The catheter was then placed through the peel-away sheath with tips ultimately positioned within the superior cavoatrial junction. Final catheter positioning was confirmed and documented with a spot radiographic image. The catheter aspirates and flushes normally.  The catheter exit site was secured with a 2-0 Prolene retention suture. The venotomy incision was closed with  Dermabond and Steri-strips. Dressings were applied. The patient tolerated the procedure well without immediate post procedural complication.  IMPRESSION: Successful placement of 25 cm tip to cuff tunneled PICC via the left internal jugular vein with tips terminating within the superior cavoatrial junction. The catheter is ready for immediate use.   Electronically Signed   By: Sandi Mariscal M.D.   On: 10/19/2014 17:51   Ir US Guide Vasc Access Left  10/19/2014   INDICATION: Poor venous access. In need of intravenous access for medication administration and blood draws.  EXAM: TUNNELED PICC PLACEMENT WITH ULTRASOUND AND FLUOROSCOPIC GUIDANCE  MEDICATIONS: The patient is currently admitted to the hospital and receiving intravenous antibiotics. The IV antibiotic was given in an appropriate time interval prior to skin puncture.  CONTRAST:  None  ANESTHESIA/SEDATION: None  FLUOROSCOPY TIME:  54 seconds.  COMPLICATIONS: None immediate  PROCEDURE: Informed written consent was obtained from the patient after a discussion of the risks, benefits, and alternatives to treatment. Questions regarding the procedure were encouraged and answered. 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 The left neck and chest were prepped with chlorhexidine in a sterile fashion, and a sterile drape was applied covering the operative field. Maximum barrier sterile technique with sterile gowns and gloves were used for the procedure. A timeout was performed prior to the initiation of the procedure.  After creating a small venotomy incision, a micropuncture kit was utilized to access the left internal jugular vein under direct, real-time ultrasound guidance after the overlying soft tissues were anesthetized with 1% lidocaine with epinephrine. Ultrasound image documentation was performed. The microwire was kinked to measure appropriate  catheter length. A 25 cm tunneled double lumen PICC line was tunneled in a retrograde fashion from the anterior chest wall to the venotomy incision.  The catheter was then placed through the peel-away sheath with tips ultimately positioned within the superior cavoatrial junction. Final catheter positioning was confirmed and documented with a spot radiographic image. The catheter aspirates and flushes normally.  The catheter exit site was secured with a 2-0 Prolene retention suture. The venotomy incision was closed with Dermabond and Steri-strips. Dressings were applied. The patient tolerated the procedure well without immediate post procedural complication.  IMPRESSION: Successful placement of 25 cm tip to cuff tunneled PICC via the left internal jugular vein with tips terminating within the superior cavoatrial junction. The catheter is ready for immediate use.   Electronically Signed   By: Sandi Mariscal M.D.   On: 10/19/2014 17:51    Anti-infectives: Anti-infectives   Start     Dose/Rate Route Frequency Ordered Stop   10/13/14 2100  cefTRIAXone (ROCEPHIN) 1 g in dextrose 5 % 50 mL IVPB     1 g 100 mL/hr over 30 Minutes Intravenous Every 24 hours 10/13/14 2034 10/19/14 2117   10/11/14 1800  fluconazole (DIFLUCAN) tablet 100 mg     100 mg Oral Daily 10/11/14 1702 10/15/14 2359   09/19/14 2000  ceFAZolin (ANCEF) IVPB  2 g/50 mL premix     2 g 100 mL/hr over 30 Minutes Intravenous Every 8 hours 09/19/14 1307 09/22/14 2032   09/13/14 1200  ceFAZolin (ANCEF) IVPB 2 g/50 mL premix  Status:  Discontinued     2 g 100 mL/hr over 30 Minutes Intravenous Every 12 hours 09/13/14 1141 09/19/14 1307   09/13/14 1000  levofloxacin (LEVAQUIN) IVPB 500 mg  Status:  Discontinued     500 mg 100 mL/hr over 60 Minutes Intravenous Every 48 hours 09/11/14 0958 09/12/14 1640   09/11/14 2300  vancomycin (VANCOCIN) 1,750 mg in sodium chloride 0.9 % 500 mL IVPB  Status:  Discontinued     1,750 mg 250 mL/hr over 120 Minutes  Intravenous Every 48 hours 09/09/14 2158 09/13/14 1139   09/11/14 1800  ciprofloxacin (CIPRO) IVPB 400 mg  Status:  Discontinued     400 mg 200 mL/hr over 60 Minutes Intravenous Every 24 hours 09/10/14 1723 09/11/14 0913   09/11/14 1030  levofloxacin (LEVAQUIN) IVPB 500 mg     500 mg 100 mL/hr over 60 Minutes Intravenous  Once 09/11/14 0958 09/11/14 1141   09/10/14 1645  ciprofloxacin (CIPRO) IVPB 400 mg     400 mg 200 mL/hr over 60 Minutes Intravenous STAT 09/10/14 1640 09/10/14 1844   09/09/14 2300  metroNIDAZOLE (FLAGYL) IVPB 500 mg  Status:  Discontinued     500 mg 100 mL/hr over 60 Minutes Intravenous Every 8 hours 09/09/14 2124 09/11/14 0915   09/09/14 2200  vancomycin (VANCOCIN) 1,500 mg in sodium chloride 0.9 % 500 mL IVPB     1,500 mg 250 mL/hr over 120 Minutes Intravenous  Once 09/09/14 2158 09/10/14 0207   09/09/14 1800  vancomycin (VANCOCIN) IVPB 1000 mg/200 mL premix     1,000 mg 200 mL/hr over 60 Minutes Intravenous  Once 09/09/14 1754 09/09/14 2042       Assessment/Plan  1. Multiple bilateral lower extremity wounds  Plan: 1. Cont santyl daily and hydrotherapy for further wound care.  No plans for surgical debridement.  We will sign off.  I have d/w Dr. [place] Pink.   LOS: 42 days    Maziyah Vessel E 10/21/2014, 9:55 AM Pager: [phone]

## 2014-10-21 NOTE — Progress Notes (Signed)
ANTICOAGULATION CONSULT NOTE - Follow Up Consult  Pharmacy Consult for Warfarin Indication: atrial fibrillation  Allergies  Allergen Reactions  . Ivp Dye [Iodinated Diagnostic Agents] Shortness Of Breath    Turn red, can't breathe  . Betadine [Povidone Iodine]   . Diphenhydramine Hcl Swelling    In hands and eyes  . Fish Allergy Nausea And Vomiting  . Iohexol      Desc: PT TURNS RED AND WHEEZING   . Penicillins Other (See Comments)    Resp arrest as child. Tolerates cephalosporins.  . Povidone-Iodine Other (See Comments)    Wheezing, turn red, can't breath, and blisters  . Promethazine Hcl Other (See Comments)    Low Blood Pressure  . Tape     Blisters, can use paper tape for short periods  . Clindamycin Hives and Rash    wheezing  . Iodine Rash  . Morphine Sulfate Nausea And Vomiting and Rash  . Primaxin [Imipenem] Rash    Patient Measurements: Height: 5' 6.93" (170 cm) Weight:  (unable to obtain) IBW/kg (Calculated) : 61.44  Vital Signs: Temp: 98.7 F (37.1 C) (10/22 0430) Temp Source: Oral (10/22 0430) BP: 102/52 mmHg (10/22 0430) Pulse Rate: 106 (10/22 0430)  Labs:  Recent Labs  10/19/14 0454 10/19/14 1336 10/20/14 0700 10/21/14 0525  LABPROT 26.2*  --  27.2* 29.7*  INR 2.39*  --  2.50* 2.79*  CREATININE  --  2.24* 2.16* 2.07*    Estimated Creatinine Clearance: 38.6 ml/min (by C-G formula based on Cr of 2.07).   Assessment: 64 yo female who continues on warfarin for hx Afib with a therapeutic INR 2.79.  INR is however trending up.  PTA dose 5 mg on TTSS, 2.5 mg on MWF.  Goal of Therapy:  INR 2-3  Plan:  1. Warfarin 1 mg x 1  2. Daily PT/INR  Thanks for allowing pharmacy to be a part of this patient's care.  Excell Seltzer, PharmD Clinical Pharmacist, [phone] 10/21/2014 9:12 AM

## 2014-10-21 NOTE — Progress Notes (Signed)
PROGRESS NOTE    [name]:[id number] DOB: 09/19/1950 DOA: 09/09/2014  PCP: Elby Showers, MD  HPI/Brief narrative 64 y.o. female with history of Morbid obesity, CKD 4, fibromyalgia, hypothyroidism, solitary kidney, hypothyroidism, diabetes, nonischemic cardiomyopathy with ejection fraction of 35%, atrial fibrillation who was admitted on 9/10 w/ recurrent Lower extremity cellulitis, immediately went into Septic shock- was in ICU on pressors until 9/14. Subsequently transferred to the Floor. Has completed antibiotics. Undergoing wound care/hydrotherapy per PT under CCS supervision. Cardiology was following as well. Palliative care team consulted for goals of care. Discharge disposition remains unclear.   Assessment/Plan:  1. Septic shock: Due to bilateral leg cellulitis. Required vasopressors in ICU. Resolved.  2. Multiple bilateral lower extremity necrotic wounds: Completed antibiotic course. Surgery & WOC followup appreciated. Continue hydrotherapy with PT. She will need OP follow up with plastic surgery. Significant pain during wound care- much better controlled with Dilaudid 2 mg IV x1 prior to dressing changes. Management per surgery. Patient has ATC pain. She was started on LA Oxycontin as it was felt she might benefit from being on long acting pain medications. Seems to be helping though it is too early to say. Oral Dilaudid for breakthrough.  3. Acute on chronic combined systolic and diastolic CHF/anasarca: Difficult to assess volume status despite large volume diuresis. Weights have been inaccurate and not regularly recorded. Cardiology switched her to oral diuretics-torsemide and metolazone. We initially held metolazone for increasing creatinine. Restarted the metolazone back on 10/20. Marked hypoalbuminemia also contributing to anasarca. Swelling of bilateral legs probably from chronic venous stasis. Cardiology signed off. Patient continues to improve. Please call the [facility]  office when she is ready for discharge and cardiology will arrange a f/u appt with Dr Sallyanne Kuster or his PA/NP.    4. A. Fib/s/p PPM: Controlled ventricular rate. Coumadin per pharmacy.  5. Hypotension: resolved 6. 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 Continue Lantus, NovoLog mealtime and SSI. 7. Hypothyroid: Continue levothyroxine. Recent TSH 2 on 05/28/14 8. History of gout: Stable  9. History of OHS/morbid obesity: Nightly CPAP 10. Tremors: Gabapentin discontinued. Seems to have resolved.  11. Acute on stage IV chronic kidney disease: Seems to be close to baseline now. Baseline creatinine probably in the low 2 range. Follow BMP periodically. 12. RUE swelling: Dopplers negative for DVT. Likely from anasarca. 13. Anemia: Secondary to chronic kidney disease, acute illness and iron deficiency: Stable 14. Hypokalemia:  On daily KCL.  15. Fecal impaction/Constipation: Significant relief after digital disimpaction and enema 10/13. She wishes to continue Colace but declines MiraLAX. Likely multifactorial from immobility, pain medications, poor diet and iron supplements. 16. Fungal rash (of back and back of arms): Was extensive. Brief course of by mouth fluconazole-DC ed after 5 days. Improved. 17. UTI: Urine cultures show klebsiella sensitive for rocephin. She completed 7 days of antibiotics on 10/20. 18. Prolonged QTC: Try to keep potassium greater than >4 and magnesium >2. Her QTC was improving.   DVT Prophylaxis: On warfarin Code Status: Full Family Communication: Discussed with patient and her sister Kieth Brightly at bedside.  Disposition Plan: Unclear disposition. Medically she is stable. CM and CSW working with patient and family.   Consultants:  General surgery   Cardiology  Critical care medicine-signed off  Nephrology-signed off  WOC  Palliative Care   Procedures:  Central venous catheter insertion procedure on 09/10/2014   Hydrotherapy by  PT  IJ PICC line 10/20    Subjective: Pain is better today. No  other complaints.   Objective: Filed Vitals:   10/19/14 2008 10/20/14 0449 10/20/14 2227 10/21/14 0430  BP: 115/62 116/61 102/48 102/52  Pulse: 83 105 67 106  Temp: 97.5 F (36.4 C) 97.7 F (36.5 C) 98 F (36.7 C) 98.7 F (37.1 C)  TempSrc: Oral Oral Oral Oral  Resp: _0 Height:      Weight:      SpO2: 100% 96% 92% 97%    Intake/Output Summary (Last 24 hours) at 10/21/14 1244 Last data filed at 10/21/14 0900  Gross per 24 hour  Intake    480 ml  Output   1600 ml  Net  -1120 ml   Filed Weights   10/08/14 0423  Weight: 136.533 kg (301 lb)     Exam:  General exam: morbidly obese female lying comfortably propped up in bed.  Respiratory system: Decreased air entry at bases but clear to auscultation. No increased work of breathing. Cardiovascular system: S1 & S2 heard, RRR. No JVD, murmurs, gallops, clicks. 2+ pitting bilateral leg edema/anasarca-leg edema appears chronic.  Gastrointestinal system: Abdomen is nondistended, soft and nontender. Normal bowel sounds heard. Central nervous system: Alert and oriented. No focal neurological deficits. Extremities: Symmetric 5 x 5 power. Multiple bilateral lower extremity wounds-details as per daily PT note. Chronic and patchy mild bilateral lower leg faint erythema-stable. Skin: Extensive patchy redness of back and back of bilateral arms suggestive of fungal infection- seem much better.  Data Reviewed: Basic Metabolic Panel:  Recent Labs Lab 10/17/14 0415 10/18/14 0354 10/19/14 1336 10/20/14 0700 10/21/14 0525  NA 133* 134* 136* 138 135*  K 4.0 3.3* 2.8* 3.6* 3.2*  CL 89* 88* 89* 91* 90*  CO2 32 34* 34* 33* 35*  GLUCOSE 150* 125* 151* 138* 148*  BUN 96* 98* 87* 85* 81*  CREATININE 2.72* 2.63* 2.24* 2.16* 2.07*  CALCIUM 7.8* 7.7* 7.7* 7.9* 7.8*  MG  --   --  1.7  --   --     CBC:  Recent Labs Lab 10/18/14 0354  WBC 11.9*  HGB 8.4*   HCT 26.0*  MCV 97.7  PLT 278   BNP (last 3 results)  Recent Labs  09/30/14 0520  PROBNP 8120.0*   CBG:  Recent Labs Lab 10/20/14 1102 10/20/14 1638 10/20/14 2156 10/21/14 0638 10/21/14 1111  GLUCAP 142* 112* 179* 162* 158*    Recent Results (from the past 240 hour(s))  URINE CULTURE     Status: None   Collection Time    10/13/14  2:52 PM      Result Value Ref Range Status   Specimen Description URINE, RANDOM   Final   Special Requests ADDED [id number]   Final   Culture  Setup Time     Final   Value: 10/13/2014 22:08     Performed at [place] Count     Final   Value: >=100,000 COLONIES/ML     Performed at [facility]   Culture     Final   Value: KLEBSIELLA PNEUMONIAE     Performed at [facility]   Report Status 10/16/2014 FINAL   Final   Organism ID, Bacteria KLEBSIELLA PNEUMONIAE   Final  MRSA PCR SCREENING     Status: None   Collection Time    10/18/14  9:12 AM      Result Value Ref Range Status   MRSA by PCR NEGATIVE  NEGATIVE Final   Comment:  The GeneXpert MRSA Assay (FDA     approved for NASAL specimens     only), is one component of a     comprehensive MRSA colonization     surveillance program. It is not     intended to diagnose MRSA     infection nor to guide or     monitor treatment for     MRSA infections.        Studies: Ir Fluoro Guide Cv Line Right  10/19/2014   INDICATION: Poor venous access. In need of intravenous access for medication administration and blood draws.  EXAM: TUNNELED PICC PLACEMENT WITH ULTRASOUND AND FLUOROSCOPIC GUIDANCE  MEDICATIONS: The patient is currently admitted to the hospital and receiving intravenous antibiotics. The IV antibiotic was given in an appropriate time interval prior to skin puncture.  CONTRAST:  None  ANESTHESIA/SEDATION: None  FLUOROSCOPY TIME:  54 seconds.  COMPLICATIONS: None immediate  PROCEDURE: Informed written consent was obtained from the  patient after a discussion of the risks, benefits, and alternatives to treatment. Questions regarding the procedure were encouraged and answered. 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 The left neck and chest were prepped with chlorhexidine in a sterile fashion, and a sterile drape was applied covering the operative field. Maximum barrier sterile technique with sterile gowns and gloves were used for the procedure. A timeout was performed prior to the initiation of the procedure.  After creating a small venotomy incision, a micropuncture kit was utilized to access the left internal jugular vein under direct, real-time ultrasound guidance after the overlying soft tissues were anesthetized with 1% lidocaine with epinephrine. Ultrasound image documentation was performed. The microwire was kinked to measure appropriate catheter length. A 25 cm tunneled double lumen PICC line was tunneled in a retrograde fashion from the anterior chest wall to the venotomy incision.  The catheter was then placed through the peel-away sheath with tips ultimately positioned within the superior cavoatrial junction. Final catheter positioning was confirmed and documented with a spot radiographic image. The catheter aspirates and flushes normally.  The catheter exit site was secured with a 2-0 Prolene retention suture. The venotomy incision was closed with Dermabond and Steri-strips. Dressings were applied. The patient tolerated the procedure well without immediate post procedural complication.  IMPRESSION: Successful placement of 25 cm tip to cuff tunneled PICC via the left internal jugular vein with tips terminating within the superior cavoatrial junction. The catheter is ready for immediate use.   Electronically Signed   By: Sandi Mariscal M.D.   On: 10/19/2014 17:51   Ir US Guide Vasc Access Left  10/19/2014   INDICATION:  Poor venous access. In need of intravenous access for medication administration and blood draws.  EXAM: TUNNELED PICC PLACEMENT WITH ULTRASOUND AND FLUOROSCOPIC GUIDANCE  MEDICATIONS: The patient is currently admitted to the hospital and receiving intravenous antibiotics. The IV antibiotic was given in an appropriate time interval prior to skin puncture.  CONTRAST:  None  ANESTHESIA/SEDATION: None  FLUOROSCOPY TIME:  54 seconds.  COMPLICATIONS: None immediate  PROCEDURE: Informed written consent was obtained from the patient after a discussion of the risks, benefits, and alternatives to treatment. Questions regarding the procedure were encouraged and answered. 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 The left neck and chest were prepped with  chlorhexidine in a sterile fashion, and a sterile drape was applied covering the operative field. Maximum barrier sterile technique with sterile gowns and gloves were used for the procedure. A timeout was performed prior to the initiation of the procedure.  After creating a small venotomy incision, a micropuncture kit was utilized to access the left internal jugular vein under direct, real-time ultrasound guidance after the overlying soft tissues were anesthetized with 1% lidocaine with epinephrine. Ultrasound image documentation was performed. The microwire was kinked to measure appropriate catheter length. A 25 cm tunneled double lumen PICC line was tunneled in a retrograde fashion from the anterior chest wall to the venotomy incision.  The catheter was then placed through the peel-away sheath with tips ultimately positioned within the superior cavoatrial junction. Final catheter positioning was confirmed and documented with a spot radiographic image. The catheter aspirates and flushes normally.  The catheter exit site was secured with a 2-0 Prolene retention  suture. The venotomy incision was closed with Dermabond and Steri-strips. Dressings were applied. The patient tolerated the procedure well without immediate post procedural complication.  IMPRESSION: Successful placement of 25 cm tip to cuff tunneled PICC via the left internal jugular vein with tips terminating within the superior cavoatrial junction. The catheter is ready for immediate use.   Electronically Signed   By: Sandi Mariscal M.D.   On: 10/19/2014 17:51     Scheduled Meds: . amiodarone  400 mg Oral Daily  . calcitRIOL  0.25 mcg Oral BID  . calcium-vitamin D  1 tablet Oral BID  . collagenase   Topical TID  . febuxostat  80 mg Oral Daily  .  HYDROmorphone (DILAUDID) injection  2 mg Intravenous Daily  . insulin aspart  0-15 Units Subcutaneous TID WC  . insulin aspart  0-5 Units Subcutaneous QHS  . insulin aspart  2 Units Subcutaneous TID WC  . insulin glargine  22 Units Subcutaneous QHS  . iron polysaccharides  150 mg Oral BID  . levothyroxine  125 mcg Oral QAC breakfast  . linagliptin  5 mg Oral Daily  . metolazone  2.5 mg Oral QODAY  . multivitamin with minerals  1 tablet Oral Daily  . OxyCODONE  10 mg Oral Q12H  . potassium chloride  40 mEq Oral Daily  . saccharomyces boulardii  250 mg Oral BID  . senna  2 tablet Oral BID  . silver nitrate applicators  1 application Topical Once  . sodium chloride  3 mL Intravenous Q12H  . torsemide  60 mg Oral BID  . warfarin  1 mg Oral ONCE-1800  . 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   Obesity hypoventilation syndrome-    Cardiomyopathy, nonischemic- EF 30-35% June 2014   Type 2 diabetes mellitus   Chronic combined systolic and diastolic CHF   Long term (current) use of anticoagulants   CKD (chronic kidney disease) stage 4, GFR 15-29 ml/min    PAF- recurrent despite Amiodarone and multiple cardioversions   Mitral insufficiency, moderate to severe   Chronic massive bilat LE lymphadema   Fall at home- 3 falls this summer, sound orthostatic   Hypotension   Acute on chronic renal failure   Coarse tremors   DNR (do not resuscitate) discussion   Palliative care encounter  Weakness generalized   UTI (urinary tract infection)    Time spent: 20 minutes.    Bonnielee Haff, MD, [facility] Pager [phone]  If 7PM-7AM, please contact night-coverage [email or web address] Password [email or web address]  10/21/2014, 12:44 PM    LOS: 42 days

## 2014-10-21 NOTE — Progress Notes (Signed)
I have seen and examined the patient and agree with the assessment and plans. Unfortunate situation but little options.  Scotti Motter A. Ninfa Linden  MD, FACS

## 2014-10-21 NOTE — Progress Notes (Signed)
Physical Therapy Wound Treatment Patient Details  Name: [name] MRN: [id number] Date of Birth: Oct 07, 1950  Today's Date: 10/21/2014 Time: 1315-1430 Time Calculation (min): 75 min  Subjective  Patient and Family Stated Goals: decr pain in wounds and heal without surgery  Pain Score: Pain Score: 9   Wound Assessment  Pressure Ulcer 09/10/14 Stage III -  Full thickness tissue loss. Subcutaneous fat may be visible but bone, tendon or muscle are NOT exposed. 3.5 X 2 (Active)  Dressing Type ABD 10/20/2014  8:22 PM  Dressing Changed 10/09/2014  3:30 AM  Dressing Change Frequency Other (Comment) 10/20/2014  8:22 PM  State of Healing Early/partial granulation 09/26/2014 11:00 AM  Site / Wound Assessment Bleeding 09/29/2014  8:00 PM  % Wound base Red or Granulating 100% 09/29/2014  8:00 PM  % Wound base Yellow 75% 09/26/2014 11:00 AM  % Wound base Black 0% 09/26/2014 11:00 AM  % Wound base Other (Comment) 0% 09/26/2014 11:00 AM  Peri-wound Assessment Intact;Pink 09/26/2014 11:00 AM  Wound Length (cm) 3.5 cm 09/11/2014  8:00 AM  Wound Width (cm) 2 cm 09/11/2014  8:00 AM  Margins Unattached edges (unapproximated) 10/20/2014  8:22 PM  Drainage Amount Copious 10/07/2014  5:30 AM  Drainage Description Sanguineous 10/07/2014  5:30 AM  Treatment Cleansed;Other (Comment) 10/01/2014  9:30 AM     Pressure Ulcer 09/20/14 Deep Tissue Injury - Purple or maroon localized area of discolored intact skin or blood-filled blister due to damage of underlying soft tissue from pressure and/or shear. (Active)  Dressing Type ABD 10/20/2014  8:22 PM  Dressing Clean;Dry;Intact 10/20/2014  8:22 PM  Dressing Change Frequency Other (Comment) 10/20/2014  8:22 PM  State of Healing Non-healing 09/29/2014  8:04 PM  % Wound base Red or Granulating 90% 09/29/2014  8:04 PM  % Wound base Yellow 10% 09/29/2014  8:04 PM  Margins Unattached edges (unapproximated) 09/29/2014  8:04 PM  Drainage Amount Scant 09/29/2014  8:04 PM   Drainage Description Serosanguineous 09/29/2014  8:04 PM  Treatment Cleansed;Other (Comment) 09/29/2014  8:04 PM     Pressure Ulcer 09/20/14 Deep Tissue Injury - Purple or maroon localized area of discolored intact skin or blood-filled blister due to damage of underlying soft tissue from pressure and/or shear. (Active)  Dressing Type Gauze (Comment);ABD;Mesh briefs;Other (Comment) 10/21/2014  4:22 PM  Dressing Changed 10/21/2014  4:22 PM  Dressing Change Frequency Other (Comment) 10/21/2014  4:22 PM  State of Healing Eschar 10/21/2014  4:22 PM  Site / Wound Assessment Yellow;Pink;Painful 10/21/2014  4:22 PM  % Wound base Red or Granulating 15% 10/21/2014  4:22 PM  % Wound base Yellow 80% 10/21/2014  4:22 PM  % Wound base Black 5% 10/21/2014  4:22 PM  % Wound base Other (Comment) 0% 10/21/2014  4:22 PM  Peri-wound Assessment Induration;Erythema (blanchable) 10/21/2014  4:22 PM  Wound Length (cm) 4.5 cm 10/14/2014  4:06 PM  Wound Width (cm) 5.6 cm 10/14/2014  4:06 PM  Wound Depth (cm) 2 cm 10/14/2014  4:06 PM  Margins Unattached edges (unapproximated) 10/21/2014  4:22 PM  Drainage Amount Copious 10/21/2014  4:22 PM  Drainage Description Serosanguineous;Odor 10/21/2014  4:22 PM  Treatment Cleansed;Debridement (Selective);Hydrotherapy (Pulse lavage);Packing (Dry gauze) 10/21/2014  4:22 PM     Wound 06/02/13 Abrasion(s) Leg Left (Active)  Dressing Type ABD;Gauze (Comment);Mesh briefs 10/20/2014  8:22 PM  Dressing Changed Changed 10/13/2014 11:45 PM  Dressing Status Clean;Dry;Intact 10/15/2014  3:28 PM  Dressing Change Frequency Daily 10/15/2014  3:28 PM  Site /  Wound Assessment Red 10/15/2014  3:28 PM  % Wound base Red or Granulating 100% 10/15/2014  3:28 PM  % Wound base Yellow 0% 10/15/2014  3:28 PM  % Wound base Black 0% 10/15/2014  3:28 PM  % Wound base Other (Comment) 0% 10/15/2014  3:28 PM  Peri-wound Assessment Intact;Edema;Erythema (blanchable) 10/15/2014  3:28 PM  Margins  Unattached edges (unapproximated) 10/15/2014  3:28 PM  Closure None 10/15/2014  3:28 PM  Drainage Amount Moderate 10/15/2014  3:28 PM  Drainage Description Serous 10/15/2014  3:28 PM  Non-staged Wound Description Full thickness 10/15/2014  3:28 PM  Treatment Hydrotherapy (Pulse lavage) 10/15/2014  3:28 PM     Wound / Incision (Open or Dehisced) 07/24/14 Incision - Open Leg Left (Active)     Wound / Incision (Open or Dehisced) 07/24/14 Incision - Open Thigh Right scabbed over circular area (Active)     Wound / Incision (Open or Dehisced) 07/29/14 Other (Comment) Thigh Left;Medial HYDRO (Active)     Wound / Incision (Open or Dehisced) 08/02/14 Other (Comment) Thigh Right;Medial Hydro, R medial thigh wound (Active)     Wound / Incision (Open or Dehisced) 09/10/14 Other (Comment) Thigh Medial;Left;Posterior;Proximal (Active)  Dressing Type Gauze (Comment);ABD;Mesh briefs 10/21/2014  4:22 PM  Dressing Changed Changed 10/21/2014  4:22 PM  Dressing Status Clean;Dry;Intact 10/21/2014  4:22 PM  Dressing Change Frequency Other (Comment) 10/21/2014  4:22 PM  Site / Wound Assessment Yellow;Black;Painful;Pink 10/21/2014  4:22 PM  % Wound base Red or Granulating 15% 10/21/2014  4:22 PM  % Wound base Yellow 85% 10/21/2014  4:22 PM  % Wound base Black 0% 10/21/2014  4:22 PM  % Wound base Other (Comment) 0% 10/21/2014  4:22 PM  Peri-wound Assessment Induration;Edema 10/21/2014  4:22 PM  Wound Length (cm) 8 cm 10/14/2014  4:06 PM  Wound Width (cm) 8.5 cm 10/14/2014  4:06 PM  Wound Depth (cm) 4.5 cm 10/14/2014  4:06 PM  Margins Unattached edges (unapproximated) 10/21/2014  4:22 PM  Closure None 10/21/2014  4:22 PM  Drainage Amount Copious 10/21/2014  4:22 PM  Drainage Description Serosanguineous;Odor 10/21/2014  4:22 PM  Non-staged Wound Description Full thickness 10/21/2014  4:22 PM  Treatment Cleansed;Debridement (Selective);Hydrotherapy (Pulse lavage);Packing (Dry gauze) 10/21/2014  4:22 PM      Wound / Incision (Open or Dehisced) 09/18/14 Other (Comment) Hip Right Black eschar tissue surrounded by pink tissue (Active)  Dressing Type Gauze (Comment);ABD;Mesh briefs 10/21/2014  4:22 PM  Dressing Changed Changed 10/21/2014  4:22 PM  Dressing Status Clean;Dry;Intact 10/21/2014  4:22 PM  Dressing Change Frequency Other (Comment) 10/21/2014  4:22 PM  Site / Wound Assessment Yellow;Red;Painful;Black 10/21/2014  4:22 PM  % Wound base Red or Granulating 10% 10/21/2014  4:22 PM  % Wound base Yellow 80% 10/21/2014  4:22 PM  % Wound base Black 10% 10/21/2014  4:22 PM  % Wound base Other (Comment) 0% 10/21/2014  4:22 PM  Peri-wound Assessment Edema;Induration 10/21/2014  4:22 PM  Wound Length (cm) 9.4 cm 10/15/2014  3:28 PM  Wound Width (cm) 8 cm 10/15/2014  3:28 PM  Wound Depth (cm) 1.5 cm 10/15/2014  3:28 PM  Margins Unattached edges (unapproximated) 10/16/2014 10:57 AM  Closure None 10/16/2014 10:57 AM  Drainage Amount Copious 10/21/2014  4:22 PM  Drainage Description Serosanguineous;Odor 10/21/2014  4:22 PM  Non-staged Wound Description Full thickness 10/21/2014  4:22 PM  Treatment Cleansed;Debridement (Selective);Hydrotherapy (Pulse lavage);Packing (Dry gauze) 10/20/2014  3:13 PM     Incision 05/05/12 Perineum Other (Comment) (Active)   Hydrotherapy  Pulsed lavage therapy - wound location: Rt lateral hip, Lt lateral thigh, Lt proximal-posteriomedial thigh Pulsed Lavage with Suction (psi): 8 psi Pulsed Lavage with Suction - Normal Saline Used: 2000 mL Pulsed Lavage Tip: Tip with splash shield Selective Debridement Selective Debridement - Location: Rt lateral hip, Lt lateral thigh, Lt proximal-posteriomedial thigh Selective Debridement - Tools Used: Forceps;Scalpel;Scissors Selective Debridement - Tissue Removed: yellow/brown eschar, yellow slough, fatty tissue   Wound Assessment and Plan  Wound Therapy - Assess/Plan/Recommendations Wound Therapy - Clinical Statement: Pt making  very slow progress with hydrotherapy due to not able to give her enough meds to handle the pain of debridement. Wound Therapy - Functional Problem List: painful wounds limiting her mobility tolerance Factors Delaying/Impairing Wound Healing: Diabetes Mellitus;Infection - systemic/local;Immobility;Multiple medical problems;Vascular compromise Hydrotherapy Plan: Debridement;Dressing change;Patient/family education;Pulsatile lavage with suction Wound Therapy - Frequency: 6X / week Wound Therapy - Follow Up Recommendations: Skilled nursing facility Wound Plan: see above  Wound Therapy Goals- Improve the function of patient's integumentary system by progressing the wound(s) through the phases of wound healing (inflammation - proliferation - remodeling) by: Decrease Necrotic Tissue to: <75% Decrease Necrotic Tissue - Progress: Progressing toward goal Increase Granulation Tissue to: >25% Increase Granulation Tissue - Progress: Progressing toward goal Improve Drainage Characteristics: Min Improve Drainage Characteristics - Progress: Not progressing Goals/treatment plan/discharge plan were made with and agreed upon by patient/family: Yes Time For Goal Achievement: 7 days Wound Therapy - Potential for Goals: Good  Goals will be updated until maximal potential achieved or discharge criteria met.  Discharge criteria: when goals achieved, discharge from hospital, MD decision/surgical intervention, no progress towards goals, refusal/missing three consecutive treatments without notification or medical reason.  GP     Jaryah Aracena, Tessie Fass 10/21/2014, 4:27 PM  10/21/2014  Donnella Sham, PT [phone]  (pager)

## 2014-10-22 LAB — BASIC METABOLIC PANEL
Anion gap: 8 (ref 5–15)
BUN: 82 mg/dL — AB (ref 6–23)
CALCIUM: 7.8 mg/dL — AB (ref 8.4–10.5)
CO2: 36 mEq/L — ABNORMAL HIGH (ref 19–32)
Chloride: 88 mEq/L — ABNORMAL LOW (ref 96–112)
Creatinine, Ser: 1.98 mg/dL — ABNORMAL HIGH (ref 0.50–1.10)
GFR, EST AFRICAN AMERICAN: 29 mL/min — AB (ref >90)
GFR, EST NON AFRICAN AMERICAN: 25 mL/min — AB (ref >90)
GLUCOSE: 143 mg/dL — AB (ref 70–99)
Potassium: 3.4 mEq/L — ABNORMAL LOW (ref 3.7–5.3)
Sodium: 132 mEq/L — ABNORMAL LOW (ref 137–147)

## 2014-10-22 LAB — GLUCOSE, CAPILLARY
GLUCOSE-CAPILLARY: 241 mg/dL — AB (ref 70–99)
Glucose-Capillary: 142 mg/dL — ABNORMAL HIGH (ref 70–99)
Glucose-Capillary: 112 mg/dL — ABNORMAL HIGH (ref 70–99)
Glucose-Capillary: 170 mg/dL — ABNORMAL HIGH (ref 70–99)

## 2014-10-22 LAB — PROTIME-INR
INR: 2.76 — ABNORMAL HIGH (ref 0.00–1.49)
PROTHROMBIN TIME: 29.4 s — AB (ref 11.6–15.2)

## 2014-10-22 MED ORDER — OXYCODONE HCL ER 15 MG PO T12A
15.0000 mg | EXTENDED_RELEASE_TABLET | Freq: Two times a day (BID) | ORAL | Status: DC
  Administered 2014-10-22 – 2014-10-26 (×8): 15 mg via ORAL
  Filled 2014-10-22 (×8): qty 1

## 2014-10-22 MED ORDER — WARFARIN SODIUM 2.5 MG PO TABS
2.5000 mg | ORAL_TABLET | Freq: Once | ORAL | Status: AC
  Administered 2014-10-22: 2.5 mg via ORAL
  Filled 2014-10-22: qty 1

## 2014-10-22 NOTE — Progress Notes (Signed)
Physical Therapy Wound Treatment Patient Details  Name: [name] MRN: [id number] Date of Birth: [birth year]  Today's Date: 10/22/2014 Time: 1420-1540 Time Calculation (min): 80 min  Subjective  Patient and Family Stated Goals: decr pain in wounds and heal without surgery  Pain Score: Pain Score: 0-No pain  SUMMARY as of this date:   There are 3 major wounds that we are addressing in hydrotherapy.  L LE has 1 on the lateral thigh below the area of the hip.  This wound is 5cm x 8cm x 3.5cm  (HxWxD), depth has increased significantly due to selective debridement.  15% is granulating and 85% is necrotic tissue. We are getting to a point where significantly more viable tissue will be uncovered soon.  The L LE also has a 2nd wound on the posterior medial thigh with dimensions 7.5cm x 7 cm x 4 cm (HxWxD).  This wound is improving despite being extremely painful during selective debridement.  15% is granulating, 85% is necrotic or sickly adipose tissue.  It too will start to show significantly more viable tissue soon.  The R LE has the 3rd major wound at the lateral thigh below the area of the hip.  The dimensions of this wound are 10 cm x 8 cm x 2 cm  (HxWxD).  Again about 15% of this wound in granulating, 80% in necrotic and 5% is black around the edges. There are 2 small wounds that have been healing ?before admission on the distal medial thigh each leg.  We are changing the gauze only on these 2 small wounds.  The last wound of note is on the posterior medial R thigh.  This wound has progressed from heavy bruising to eschar now.  We have not started addressing this wound formally, but have covered it with telfa and abd.  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 Subcutaneous fat may be visible but bone, tendon or muscle are NOT exposed. 3.5 X 2 (Active)  Dressing Type ABD 10/22/2014  8:50 AM  Dressing Clean;Dry;Intact 10/21/2014  8:05 PM  Dressing Change  Frequency Other (Comment) 10/22/2014  8:50 AM  State of Healing Early/partial granulation 09/26/2014 11:00 AM  Site / Wound Assessment Dressing in place / Unable to assess 10/21/2014  8:05 PM  % Wound base Red or Granulating 100% 09/29/2014  8:00 PM  % Wound base Yellow 75% 09/26/2014 11:00 AM  % Wound base Black 0% 09/26/2014 11:00 AM  % Wound base Other (Comment) 0% 09/26/2014 11:00 AM  Peri-wound Assessment Intact;Pink 09/26/2014 11:00 AM  Wound Length (cm) 3.5 cm 09/11/2014  8:00 AM  Wound Width (cm) 2 cm 09/11/2014  8:00 AM  Margins Unattached edges (unapproximated) 10/22/2014  8:50 AM  Drainage Amount Copious 10/07/2014  5:30 AM  Drainage Description Sanguineous 10/07/2014  5:30 AM  Treatment Cleansed;Other (Comment) 10/01/2014  9:30 AM     Pressure Ulcer 09/20/14 Deep Tissue Injury - Purple or maroon localized area of discolored intact skin or blood-filled blister due to damage of underlying soft tissue from pressure and/or shear. (Active)  Dressing Type ABD 10/22/2014  8:50 AM  Dressing Clean;Dry;Intact 10/22/2014  8:50 AM  Dressing Change Frequency Other (Comment) 10/22/2014  8:50 AM  State of Healing Non-healing 09/29/2014  8:04 PM  Site / Wound Assessment Dressing in place / Unable to assess 10/21/2014  8:05 PM  % Wound base Red or Granulating 90% 09/29/2014  8:04 PM  % Wound base Yellow 10% 09/29/2014  8:04 PM  Margins Unattached edges (unapproximated) 09/29/2014  8:04 PM  Drainage Amount Scant 09/29/2014  8:04 PM  Drainage Description Serosanguineous 09/29/2014  8:04 PM  Treatment Cleansed;Other (Comment) 09/29/2014  8:04 PM     Pressure Ulcer 09/20/14 Deep Tissue Injury - Purple or maroon localized area of discolored intact skin or blood-filled blister due to damage of underlying soft tissue from pressure and/or shear. (Active)  Dressing Type Gauze (Comment);ABD;Mesh briefs;Other (Comment) 10/22/2014  3:56 PM  Dressing Changed 10/22/2014  3:56 PM  Dressing Change Frequency Other (Comment)  10/22/2014  3:56 PM  State of Healing Eschar 10/22/2014  3:56 PM  Site / Wound Assessment Yellow;Pink;Painful 10/22/2014  3:56 PM  % Wound base Red or Granulating 15% 10/22/2014  3:56 PM  % Wound base Yellow 80% 10/22/2014  3:56 PM  % Wound base Black 5% 10/22/2014  3:56 PM  % Wound base Other (Comment) 0% 10/22/2014  3:56 PM  Peri-wound Assessment Induration;Erythema (blanchable) 10/22/2014  3:56 PM  Wound Length (cm) 5 cm 10/22/2014  3:56 PM  Wound Width (cm) 8 cm 10/22/2014  3:56 PM  Wound Depth (cm) 3.5 cm 10/22/2014  3:56 PM  Margins Unattached edges (unapproximated) 10/22/2014  3:56 PM  Drainage Amount Copious 10/22/2014  3:56 PM  Drainage Description Serosanguineous 10/22/2014  3:56 PM  Treatment Cleansed;Debridement (Selective);Hydrotherapy (Pulse lavage);Packing (Dry gauze) 10/22/2014  3:56 PM     Wound 06/02/13 Abrasion(s) Leg Left (Active)  Dressing Type ABD;Gauze (Comment);Mesh briefs 10/22/2014  8:50 AM  Dressing Changed Changed 10/13/2014 11:45 PM  Dressing Status Clean;Dry;Intact 10/21/2014  8:05 PM  Dressing Change Frequency Daily 10/15/2014  3:28 PM  Site / Wound Assessment Dressing in place / Unable to assess 10/21/2014  8:05 PM  % Wound base Red or Granulating 100% 10/15/2014  3:28 PM  % Wound base Yellow 0% 10/15/2014  3:28 PM  % Wound base Black 0% 10/15/2014  3:28 PM  % Wound base Other (Comment) 0% 10/15/2014  3:28 PM  Peri-wound Assessment Intact;Edema;Erythema (blanchable) 10/15/2014  3:28 PM  Margins Unattached edges (unapproximated) 10/15/2014  3:28 PM  Closure None 10/15/2014  3:28 PM  Drainage Amount Moderate 10/15/2014  3:28 PM  Drainage Description Serous 10/15/2014  3:28 PM  Non-staged Wound Description Full thickness 10/15/2014  3:28 PM  Treatment Hydrotherapy (Pulse lavage) 10/15/2014  3:28 PM     Wound / Incision (Open or Dehisced) 07/24/14 Incision - Open Leg Left (Active)     Wound / Incision (Open or Dehisced) 07/24/14 Incision - Open Thigh  Right scabbed over circular area (Active)     Wound / Incision (Open or Dehisced) 07/29/14 Other (Comment) Thigh Left;Medial HYDRO (Active)     Wound / Incision (Open or Dehisced) 08/02/14 Other (Comment) Thigh Right;Medial Hydro, R medial thigh wound (Active)     Wound / Incision (Open or Dehisced) 09/10/14 Other (Comment) Thigh Medial;Left;Posterior;Proximal (Active)  Dressing Type Gauze (Comment);ABD;Mesh briefs 10/22/2014  3:56 PM  Dressing Changed Changed 10/21/2014  4:22 PM  Dressing Status Clean;Dry;Intact 10/22/2014  3:56 PM  Dressing Change Frequency Other (Comment) 10/22/2014  3:56 PM  Site / Wound Assessment Yellow;Black;Painful;Pink 10/22/2014  3:56 PM  % Wound base Red or Granulating 15% 10/22/2014  3:56 PM  % Wound base Yellow 85% 10/22/2014  3:56 PM  % Wound base Black 0% 10/22/2014  3:56 PM  % Wound base Other (Comment) 0% 10/22/2014  3:56 PM  Peri-wound Assessment Induration;Edema 10/22/2014  3:56 PM  Wound Length (cm) 7.5 cm 10/22/2014  3:56 PM  Wound Width (cm) 7 cm 10/22/2014  3:56 PM  Wound Depth (cm) 4 cm 10/22/2014  3:56 PM  Margins Unattached edges (unapproximated) 10/22/2014  3:56 PM  Closure None 10/22/2014  3:56 PM  Drainage Amount Copious 10/22/2014  3:56 PM  Drainage Description Serosanguineous;Odor 10/22/2014  3:56 PM  Non-staged Wound Description Full thickness 10/22/2014  3:56 PM  Treatment Cleansed;Debridement (Selective);Hydrotherapy (Pulse lavage);Packing (Dry gauze) 10/22/2014  3:56 PM     Wound / Incision (Open or Dehisced) 09/18/14 Other (Comment) Hip Right Black eschar tissue surrounded by pink tissue (Active)  Dressing Type Gauze (Comment);ABD;Mesh briefs 10/22/2014  3:56 PM  Dressing Changed Changed 10/22/2014  3:56 PM  Dressing Status Clean;Dry;Intact 10/22/2014  3:56 PM  Dressing Change Frequency Other (Comment) 10/22/2014  3:56 PM  Site / Wound Assessment Yellow;Red;Painful;Black 10/22/2014  3:56 PM  % Wound base Red or Granulating 15%  10/22/2014  3:56 PM  % Wound base Yellow 80% 10/22/2014  3:56 PM  % Wound base Black 5% 10/22/2014  3:56 PM  % Wound base Other (Comment) 0% 10/22/2014  3:56 PM  Peri-wound Assessment Edema;Induration 10/22/2014  3:56 PM  Wound Length (cm) 10 cm 10/22/2014  3:56 PM  Wound Width (cm) 8 cm 10/22/2014  3:56 PM  Wound Depth (cm) 2 cm 10/22/2014  3:56 PM  Undermining (cm) 1.5 10/22/2014  3:56 PM  Margins Unattached edges (unapproximated) 10/22/2014  3:56 PM  Closure None 10/16/2014 10:57 AM  Drainage Amount Copious 10/22/2014  3:56 PM  Drainage Description Serosanguineous;Odor 10/22/2014  3:56 PM  Non-staged Wound Description Full thickness 10/22/2014  3:56 PM  Treatment Cleansed;Debridement (Selective);Hydrotherapy (Pulse lavage);Packing (Dry gauze) 10/22/2014  3:56 PM     Incision 05/05/12 Perineum Other (Comment) (Active)   Hydrotherapy Pulsed lavage therapy - wound location: Rt lateral hip, Lt lateral thigh, Lt proximal-posteriomedial thigh Pulsed Lavage with Suction (psi): 8 psi Pulsed Lavage with Suction - Normal Saline Used: 2000 mL Pulsed Lavage Tip: Tip with splash shield Selective Debridement Selective Debridement - Location: Rt lateral hip, Lt lateral thigh, Lt proximal-posteriomedial thigh Selective Debridement - Tools Used: Forceps;Scalpel;Scissors Selective Debridement - Tissue Removed: yellow/brown eschar, yellow slough, fatty tissue   Wound Assessment and Plan  Wound Therapy - Assess/Plan/Recommendations Wound Therapy - Clinical Statement: Pt making very slow progress with hydrotherapy due to not able to give her enough meds to handle the pain of debridement. Wound Therapy - Functional Problem List: painful wounds limiting her mobility tolerance Factors Delaying/Impairing Wound Healing: Diabetes Mellitus;Infection - systemic/local;Immobility;Multiple medical problems;Vascular compromise Hydrotherapy Plan: Debridement;Dressing change;Patient/family education;Pulsatile  lavage with suction Wound Therapy - Frequency: 6X / week Wound Therapy - Follow Up Recommendations: Skilled nursing facility Wound Plan: see above  Wound Therapy Goals- Improve the function of patient's integumentary system by progressing the wound(s) through the phases of wound healing (inflammation - proliferation - remodeling) by: Decrease Necrotic Tissue to: <75% Increase Granulation Tissue to: >25% Improve Drainage Characteristics: Min Goals/treatment plan/discharge plan were made with and agreed upon by patient/family: Yes Time For Goal Achievement: 7 days Wound Therapy - Potential for Goals: Good  Goals will be updated until maximal potential achieved or discharge criteria met.  Discharge criteria: when goals achieved, discharge from hospital, MD decision/surgical intervention, no progress towards goals, refusal/missing three consecutive treatments without notification or medical reason.  GP     Alexzandrea Normington, Tessie Fass 10/22/2014, 4:12 PM

## 2014-10-22 NOTE — Progress Notes (Signed)
Pt refuses CPAP at this time. RT will continue to monitor

## 2014-10-22 NOTE — Progress Notes (Signed)
Physical Therapy Treatment Patient Details Name: [name] MRN: [id number] DOB: [birth year] Today's Date: 10/22/2014    History of Present Illness 64 y.o. female  with history of fibromyalgia, hypothyroidism, solitary kidney, hypothyroidism, diabetes, nonischemic cardiomyopathy with ejection fraction of 35%, atrial fibrillation who presented to the emergency department with left-sided lower leg pain. She was admitted on 9/10 w/ recurrent Lower extremity cellulitis. PCCM asked to see in consult on 9/11 for persistent hypotension and worsening renal failure.  She had a previous admission for LE cellulitis in 07/2014 followed by [facility].  Pt had returned home with HHPT prior to this admission.     PT Comments    Pt admitted with above. Pt currently with functional limitations due to balance and endurance deficits as well as strength deficits.  Pt in the best spirits today that I have seen her in awhile.  Willing and tried very hard to stand.  Pt will benefit from skilled PT to increase their independence and safety with mobility to allow discharge to the venue listed below.    Follow Up Recommendations  LTACH     Equipment Recommendations  None recommended by PT    Recommendations for Other Services       Precautions / Restrictions Precautions Precautions: Fall Restrictions Weight Bearing Restrictions: No    Mobility  Bed Mobility Overal bed mobility: Needs Assistance;+2 for physical assistance Bed Mobility: Rolling Rolling: Mod assist;+2 for physical assistance   Supine to sit: +2 for physical assistance;Max assist Sit to supine: Max assist;+2 for physical assistance   General bed mobility comments: Rail use for rolling over onto R elbow and then truncal assist to come up.  LE are symmetrically assisted to EOB in a "helicopter-like " maneuver.  Transfers Overall transfer level: Needs assistance Equipment used: Rolling walker (2 wheeled) Transfers: Sit to/from Stand Sit  to Stand: +2 physical assistance;Max assist         General transfer comment: pt stood x 2 ranging from 3 secs to 10 secs.  Stood enough to clear bottom off bed and replace pads but was not using UEs as much as last attempt with this PT.  Her trunk was much more flexed and pt was leaning on RW thus making it harder to stand up.    Ambulation/Gait                 Stairs            Wheelchair Mobility    Modified Rankin (Stroke Patients Only)       Balance Overall balance assessment: Needs assistance;History of Falls Sitting-balance support: No upper extremity supported;Feet supported Sitting balance-Leahy Scale: Fair Sitting balance - Comments: sat EOB 15 min while doing a few sitting exercises with UE and LEs.     Standing balance support: Bilateral upper extremity supported;During functional activity Standing balance-Leahy Scale: Poor Standing balance comment: Needs Bil UE support and cannot achieve full upright stance.                     Cognition Arousal/Alertness: Awake/alert Behavior During Therapy: WFL for tasks assessed/performed Overall Cognitive Status: Within Functional Limits for tasks assessed                      Exercises General Exercises - Upper Extremity Elbow Flexion: Strengthening;Both;10 reps;Theraband;Supine Theraband Level (Elbow Flexion): Level 2 (Red) Elbow Extension: Strengthening;Both;10 reps;Supine;Theraband Theraband Level (Elbow Extension): Level 2 (Red) General Exercises - Lower Extremity Ankle Circles/Pumps:  AROM;Strengthening;Both;20 reps;Seated Other Exercises Other Exercises: Instructed pt to not perform right UE exercises with theraband to take a break as her right shoulder has been sore.  Placed heat on shoulder and told pt to do that several times this weekend.     General Comments General comments (skin integrity, edema, etc.): Skin looked better overall today and edema lessened.        Pertinent  Vitals/Pain Faces Pain Scale: Hurts little more Pain Location: bil LES with movement and right shoulder Pain Descriptors / Indicators: Aching Pain Intervention(s): Limited activity within patient's tolerance;Monitored during session;Premedicated before session;Repositioned;Heat applied VSS    Home Living                      Prior Function            PT Goals (current goals can now be found in the care plan section) Progress towards PT goals: Progressing toward goals    Frequency  Min 2X/week    PT Plan Current plan remains appropriate    Co-evaluation             End of Session Equipment Utilized During Treatment: Gait belt Activity Tolerance: Patient limited by pain;Patient tolerated treatment well Patient left: in bed;with call bell/phone within reach     Time: 1139-1224 PT Time Calculation (min): 45 min  Charges:  $Gait Training: 8-22 mins $Therapeutic Exercise: 8-22 mins $Therapeutic Activity: 8-22 mins                    G Codes:      Denice Paradise 11-03-2014, 2:36 PM [facility] [facility] [phone] (pager)

## 2014-10-22 NOTE — Progress Notes (Signed)
PROGRESS NOTE    [name] EYC:[id number] DOB: 1950/06/28 DOA: 09/09/2014  PCP: Elby Showers, MD  HPI/Brief narrative 64 y.o. female with history of Morbid obesity, CKD 4, fibromyalgia, hypothyroidism, solitary kidney, hypothyroidism, diabetes, nonischemic cardiomyopathy with ejection fraction of 35%, atrial fibrillation who was admitted on 9/10 w/ recurrent Lower extremity cellulitis, immediately went into Septic shock- was in ICU on pressors until 9/14. Subsequently transferred to the Floor. Has completed antibiotics. Undergoing wound care/hydrotherapy per PT under CCS supervision. Cardiology was following as well. Palliative care team consulted for goals of care. Discharge disposition remains unclear.   Assessment/Plan:  1. Multiple bilateral lower extremity necrotic wounds: Completed antibiotic course. Surgery & WOC followup appreciated. Continue hydrotherapy with PT. She will need OP follow up with plastic surgery. Significant pain during wound care- much better controlled with Dilaudid 2 mg IV x1 prior to dressing changes. Management per surgery. Patient has ATC pain. She was started on LA Oxycontin as it was felt she might benefit from being on long acting pain medications. Seems to be helping though it is too early to say. Will increase dose slightly. Oral Dilaudid for breakthrough. IV dilaudid only if oral doesn't help. 2. Acute on chronic combined systolic and diastolic CHF/anasarca: Difficult to assess volume status despite large volume diuresis. Weights have been inaccurate and not regularly recorded. But she seems reasonably well compensated currently. Cardiology switched her to oral diuretics-torsemide and metolazone. We initially held metolazone for increasing creatinine. Restarted the metolazone back on 10/20. Marked hypoalbuminemia also contributing to anasarca. Swelling of bilateral legs probably from chronic venous stasis. Cardiology signed off. Patient continues to  improve. Please call the [facility] office when she is ready for discharge and cardiology will arrange a f/u appt with Dr Sallyanne Kuster or his PA/NP.    3. A. Fib/s/p PPM: Controlled ventricular rate. Coumadin per pharmacy.  4. Acute on stage IV chronic kidney disease: Seems to be close to baseline now. Baseline creatinine probably in the low 2 range. Follow BMP periodically. 5. 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 Continue Lantus, NovoLog mealtime and SSI. 6. Hypothyroidism: Continue levothyroxine. Recent TSH 2 on 05/28/14 7. History of gout: Stable  8. History of OHS/morbid obesity: Nightly CPAP 9. Tremors: Gabapentin discontinued. Seems to have resolved.  10. RUE swelling: Dopplers negative for DVT. Likely from anasarca. 11. Anemia: Secondary to chronic kidney disease, acute illness and iron deficiency: Stable 12. Hypokalemia:  On daily KCL.  13. Fecal impaction/Constipation: Significant relief after digital disimpaction and enema 10/13. She wishes to continue Colace but declines MiraLAX. Likely multifactorial from immobility, pain medications, poor diet and iron supplements. 14. Fungal rash (of back and back of arms): Was extensive. Brief course of by mouth fluconazole-DC ed after 5 days. Improved. 15. UTI: Urine cultures show klebsiella sensitive for rocephin. She completed 7 days of antibiotics on 10/20. 16. Prolonged QTC: Try to keep potassium greater than >4 and magnesium >2. Her QTC was improving.  17. Septic shock: Due to bilateral leg cellulitis. Required vasopressors in ICU. Resolved.   DVT Prophylaxis: On warfarin Code Status: Full Family Communication: Discussed with patient and her sister Kieth Brightly at bedside.  Disposition Plan: Unclear disposition. Medically she is stable. CM and CSW working with patient and family.   Consultants:  General surgery   Cardiology  Critical care medicine-signed off  Nephrology-signed off  WOC  Palliative  Care   Procedures:  Central venous catheter insertion procedure on 09/10/2014   Hydrotherapy by PT  IJ PICC line 10/20   Subjective: Pain is better today but persists. No other complaints.   Objective: Filed Vitals:   10/21/14 0430 10/21/14 1759 10/21/14 2213 10/22/14 0700  BP: 102/52 102/56 96/64 101/59  Pulse: 106 104 105 104  Temp: 98.7 F (37.1 C) 98.5 F (36.9 C) 98 F (36.7 C) 98.2 F (36.8 C)  TempSrc: Oral Oral Oral Oral  Resp: 17 18 18 18   Height:      Weight:      SpO2: 97% 95% 94% 95%    Intake/Output Summary (Last 24 hours) at 10/22/14 1054 Last data filed at 10/22/14 0820  Gross per 24 hour  Intake    240 ml  Output   2950 ml  Net  -2710 ml   Filed Weights   10/08/14 0423  Weight: 136.533 kg (301 lb)     Exam:  General exam: morbidly obese female lying comfortably propped up in bed.  Respiratory system: Decreased air entry at bases but clear to auscultation. No increased work of breathing. Cardiovascular system: S1 & S2 heard, RRR. No JVD, murmurs, gallops, clicks. 2+ pitting bilateral leg edema/anasarca-leg edema appears chronic.  Gastrointestinal system: Abdomen is nondistended, soft and nontender. Normal bowel sounds heard. Central nervous system: Alert and oriented. No focal neurological deficits. Extremities: Symmetric 5 x 5 power. Multiple bilateral lower extremity wounds-details as per daily PT note.    Data Reviewed: Basic Metabolic Panel:  Recent Labs Lab 10/18/14 0354 10/19/14 1336 10/20/14 0700 10/21/14 0525 10/22/14 0500  NA 134* 136* 138 135* 132*  K 3.3* 2.8* 3.6* 3.2* 3.4*  CL 88* 89* 91* 90* 88*  CO2 34* 34* 33* 35* 36*  GLUCOSE 125* 151* 138* 148* 143*  BUN 98* 87* 85* 81* 82*  CREATININE 2.63* 2.24* 2.16* 2.07* 1.98*  CALCIUM 7.7* 7.7* 7.9* 7.8* 7.8*  MG  --  1.7  --   --   --     CBC:  Recent Labs Lab 10/18/14 0354  WBC 11.9*  HGB 8.4*  HCT 26.0*  MCV 97.7  PLT 278   BNP (last 3 results)  Recent  Labs  09/30/14 0520  PROBNP 8120.0*   CBG:  Recent Labs Lab 10/21/14 0638 10/21/14 1111 10/21/14 1621 10/21/14 2211 10/22/14 0704  GLUCAP 162* 158* 209* 161* 142*    Recent Results (from the past 240 hour(s))  URINE CULTURE     Status: None   Collection Time    10/13/14  2:52 PM      Result Value Ref Range Status   Specimen Description URINE, RANDOM   Final   Special Requests ADDED [id number]   Final   Culture  Setup Time     Final   Value: 10/13/2014 22:08     Performed at [place] Count     Final   Value: >=100,000 COLONIES/ML     Performed at [facility]   Culture     Final   Value: KLEBSIELLA PNEUMONIAE     Performed at [facility]   Report Status 10/16/2014 FINAL   Final   Organism ID, Bacteria KLEBSIELLA PNEUMONIAE   Final  MRSA PCR SCREENING     Status: None   Collection Time    10/18/14  9:12 AM      Result Value Ref Range Status   MRSA by PCR NEGATIVE  NEGATIVE Final   Comment:            The GeneXpert  MRSA Assay (FDA     approved for NASAL specimens     only), is one component of a     comprehensive MRSA colonization     surveillance program. It is not     intended to diagnose MRSA     infection nor to guide or     monitor treatment for     MRSA infections.        Studies: No results found.   Scheduled Meds: . amiodarone  400 mg Oral Daily  . calcitRIOL  0.25 mcg Oral BID  . calcium-vitamin D  1 tablet Oral BID  . collagenase   Topical TID  . docusate sodium  100 mg Oral BID  . febuxostat  80 mg Oral Daily  .  HYDROmorphone (DILAUDID) injection  1 mg Intravenous Once  .  HYDROmorphone (DILAUDID) injection  2 mg Intravenous Daily  . insulin aspart  0-15 Units Subcutaneous TID WC  . insulin aspart  0-5 Units Subcutaneous QHS  . insulin aspart  2 Units Subcutaneous TID WC  . insulin glargine  22 Units Subcutaneous QHS  . iron polysaccharides  150 mg Oral BID  . levothyroxine  125 mcg Oral QAC  breakfast  . linagliptin  5 mg Oral Daily  . metolazone  2.5 mg Oral QODAY  . multivitamin with minerals  1 tablet Oral Daily  . OxyCODONE  15 mg Oral Q12H  . potassium chloride  40 mEq Oral Daily  . saccharomyces boulardii  250 mg Oral BID  . silver nitrate applicators  1 application Topical Once  . sodium chloride  3 mL Intravenous Q12H  . torsemide  60 mg Oral BID  . warfarin  2.5 mg Oral ONCE-1800  . 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   Obesity hypoventilation syndrome-    Cardiomyopathy, nonischemic- EF 30-35% June 2014   Type 2 diabetes mellitus   Chronic combined systolic and diastolic CHF   Long term (current) use of anticoagulants   CKD (chronic kidney disease) stage 4, GFR 15-29 ml/min   PAF- recurrent despite Amiodarone and multiple cardioversions   Mitral insufficiency, moderate to severe   Chronic massive bilat LE lymphadema   Fall at home- 3 falls this summer, sound orthostatic   Hypotension   Acute on chronic renal failure   Coarse tremors   DNR (do not resuscitate) discussion   Palliative care encounter   Weakness generalized   UTI (urinary tract infection)    Time spent: 20 minutes.    Bonnielee Haff, MD, [facility] Pager [phone]  If 7PM-7AM, please contact night-coverage [email or web address] Password [email or web address]  10/22/2014, 10:54 AM    LOS: 43 days

## 2014-10-22 NOTE — Progress Notes (Signed)
ANTICOAGULATION CONSULT NOTE - Follow Up Consult  Pharmacy Consult for Warfarin Indication: atrial fibrillation  Recent Labs  10/20/14 0700 10/21/14 0525 10/22/14 0500  LABPROT 27.2* 29.7* 29.4*  INR 2.50* 2.79* 2.76*  CREATININE 2.16* 2.07* 1.98*    Estimated Creatinine Clearance: 40.3 ml/min (by C-G formula based on Cr of 1.98).   Assessment: 64 yo female who continues on warfarin for hx Afib with a therapeutic INR 2.76.  No bleeding noted  PTA dose 5 mg on TTSS, 2.5 mg on MWF.  Goal of Therapy:  INR 2-3  Plan:  1. Warfarin 2.5 mg x 1  2. Daily PT/INR  Thanks for allowing pharmacy to be a part of this patient's care.  Excell Seltzer, PharmD Clinical Pharmacist, [phone] 10/22/2014 8:45 AM

## 2014-10-22 NOTE — Progress Notes (Signed)
Occupational Therapy Treatment Patient Details Name: [name] MRN: [id number] DOB: [birth year] Today's Date: 10/22/2014    History of present illness 64 y.o. female  with history of fibromyalgia, hypothyroidism, solitary kidney, hypothyroidism, diabetes, nonischemic cardiomyopathy with ejection fraction of 35%, atrial fibrillation who presented to the emergency department with left-sided lower leg pain. She was admitted on 9/10 w/ recurrent Lower extremity cellulitis. PCCM asked to see in consult on 9/11 for persistent hypotension and worsening renal failure.  She had a previous admission for LE cellulitis in 07/2014 followed by [facility].  Pt had returned home with HHPT prior to this admission.    OT comments  Continuing to work on UE strength and endurance and grooming activities at bed level.    Follow Up Recommendations  LTACH;SNF    Equipment Recommendations  None recommended by OT    Recommendations for Other Services      Precautions / Restrictions Precautions Precautions: Fall       Mobility Bed Mobility                  Transfers                      Balance                                   ADL Overall ADL's : Needs assistance/impaired     Grooming: Wash/dry hands;Wash/dry face;Oral care;Brushing hair;Bed level;Set up           Upper Body Dressing : Set up;Bed level                            Vision                     Perception     Praxis      Cognition   Behavior During Therapy: [facility] for tasks assessed/performed Overall Cognitive Status: Within Functional Limits for tasks assessed                       Extremity/Trunk Assessment               Exercises General Exercises - Upper Extremity Shoulder Flexion: Strengthening;Both;10 reps;Theraband;Seated Theraband Level (Shoulder Flexion): Level 1 (Yellow);Level 2 (Red) Shoulder Extension: Strengthening;Both;10  reps;Supine;Theraband Shoulder ABduction: AROM;Both;10 reps;Strengthening;Theraband;Supine Theraband Level (Shoulder Abduction): Level 2 (Red) Shoulder Horizontal ABduction: Strengthening;Both;10 reps;Theraband;Supine Theraband Level (Shoulder Horizontal Abduction): Level 2 (Red) Elbow Flexion: Strengthening;Both;10 reps;Theraband;Supine Theraband Level (Elbow Flexion): Level 2 (Red) Elbow Extension: Strengthening;Both;10 reps;Supine;Theraband Theraband Level (Elbow Extension): Level 2 (Red)   Shoulder Instructions       General Comments      Pertinent Vitals/ Pain       Pain Assessment: No/denies pain  Home Living                                          Prior Functioning/Environment              Frequency Min 2X/week     Progress Toward Goals  OT Goals(current goals can now be found in the care plan section)  Progress towards OT goals: Progressing toward goals  Acute Rehab OT Goals Patient Stated Goal: To get out of this  bed Time For Goal Achievement: 10/29/14 Potential to Achieve Goals: Good  Plan Discharge plan remains appropriate    Co-evaluation                 End of Session     Activity Tolerance Patient tolerated treatment well   Patient Left in bed;with call bell/phone within reach   Nurse Communication          Time: 0093-8182 OT Time Calculation (min): 33 min  Charges: OT General Charges $OT Visit: 1 Procedure OT Treatments $Self Care/Home Management : 8-22 mins $Therapeutic Exercise: 8-22 mins  Malka So 10/22/2014, 9:24 AM [phone]

## 2014-10-23 DIAGNOSIS — E871 Hypo-osmolality and hyponatremia: Secondary | ICD-10-CM

## 2014-10-23 LAB — BASIC METABOLIC PANEL
ANION GAP: 9 (ref 5–15)
BUN: 81 mg/dL — ABNORMAL HIGH (ref 6–23)
CHLORIDE: 87 meq/L — AB (ref 96–112)
CO2: 35 mEq/L — ABNORMAL HIGH (ref 19–32)
Calcium: 7.9 mg/dL — ABNORMAL LOW (ref 8.4–10.5)
Creatinine, Ser: 2.02 mg/dL — ABNORMAL HIGH (ref 0.50–1.10)
GFR calc Af Amer: 28 mL/min — ABNORMAL LOW (ref >90)
GFR calc non Af Amer: 25 mL/min — ABNORMAL LOW (ref >90)
Glucose, Bld: 129 mg/dL — ABNORMAL HIGH (ref 70–99)
POTASSIUM: 3.3 meq/L — AB (ref 3.7–5.3)
Sodium: 131 mEq/L — ABNORMAL LOW (ref 137–147)

## 2014-10-23 LAB — GLUCOSE, CAPILLARY
GLUCOSE-CAPILLARY: 153 mg/dL — AB (ref 70–99)
Glucose-Capillary: 133 mg/dL — ABNORMAL HIGH (ref 70–99)
Glucose-Capillary: 106 mg/dL — ABNORMAL HIGH (ref 70–99)
Glucose-Capillary: 195 mg/dL — ABNORMAL HIGH (ref 70–99)

## 2014-10-23 LAB — PROTIME-INR
INR: 3.14 — AB (ref 0.00–1.49)
PROTHROMBIN TIME: 32.5 s — AB (ref 11.6–15.2)

## 2014-10-23 NOTE — Progress Notes (Signed)
Pt. Continues to refuse CPAP at this time. Pt. Was made aware to let RT or RN know anytime during the night if she changed her mind & decided to wear CPAP. 

## 2014-10-23 NOTE — Progress Notes (Signed)
PROGRESS NOTE    TANNER VIGNA YPP:[id number] DOB: 12-18-1950 DOA: 09/09/2014  PCP: Elby Showers, MD  HPI/Brief narrative 64 y.o. female with history of Morbid obesity, CKD 4, fibromyalgia, hypothyroidism, solitary kidney, hypothyroidism, diabetes, nonischemic cardiomyopathy with ejection fraction of 35%, atrial fibrillation who was admitted on 9/10 w/ recurrent Lower extremity cellulitis, immediately went into Septic shock- was in ICU on pressors until 9/14. Subsequently transferred to the Floor. Has completed antibiotics. Undergoing wound care/hydrotherapy per PT under CCS supervision. Cardiology was following as well. Palliative care team was consulted as well. Discharge disposition remains unclear.   Assessment/Plan:  1. Multiple bilateral lower extremity necrotic wounds: Completed antibiotic course. Surgery & WOC followup appreciated. Continue hydrotherapy with PT. She will need OP follow up with plastic surgery. Significant pain during wound care- much better controlled with Dilaudid 2 mg IV x1 prior to dressing changes. General surgery has been following but doesn't have anything more to offer. Patient has ATC pain. She was started on LA Oxycontin as it was felt she might benefit from being on long acting pain medications. Seems to be helping though it is too early to say. Dose was increased slightly 10/23. Oral Dilaudid for breakthrough. IV dilaudid only if oral doesn't help. 2. Acute on chronic combined systolic and diastolic CHF/anasarca: Seems to be improved. Difficult to assess volume status despite large volume diuresis. Weights have been inaccurate and not regularly recorded. But she seems to have lost about 20kg during her current stay. She seems reasonably well compensated currently. Cardiology switched her to oral diuretics-torsemide and metolazone. We initially held metolazone for increasing creatinine. Restarted the metolazone back on 10/20. Marked hypoalbuminemia also  contributing to anasarca. Swelling of bilateral legs probably from chronic venous stasis. Cardiology signed off. Patient continues to improve. Please call the [facility] office when she is ready for discharge and cardiology will arrange a f/u appt with Dr Sallyanne Kuster or his PA/NP.    3. A. Fib/s/p PPM: Controlled ventricular rate. Coumadin per pharmacy.  4. Acute on stage IV chronic kidney disease: Seems to be close to baseline now. Baseline creatinine probably in the low 2 range. Follow BMP periodically. 5. 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 Continue Lantus, NovoLog mealtime and SSI. 6. Hypothyroidism: Continue levothyroxine. Recent TSH 2 on 05/28/14 7. History of gout: Stable  8. History of OHS/morbid obesity: Nightly CPAP 9. Tremors: Gabapentin discontinued. Seems to have resolved.  10. RUE swelling: Dopplers negative for DVT. Likely from anasarca. 11. Anemia: Secondary to chronic kidney disease, acute illness and iron deficiency: Stable 12. Hypokalemia/Hyponatremia:  On daily KCL. Sodium levels lower. Monitor for now. May have to cut back on diuretics if it drops further. 13. Fecal impaction/Constipation: Significant relief after digital disimpaction and enema 10/13. She wishes to continue Colace but declines MiraLAX. Likely multifactorial from immobility, pain medications, poor diet and iron supplements. 14. Fungal rash (of back and back of arms): Was extensive. Brief course of by mouth fluconazole-DC ed after 5 days. Improved. 15. UTI: Urine cultures show klebsiella sensitive for rocephin. She completed 7 days of antibiotics on 10/20. 16. Prolonged QTC: Try to keep potassium greater than >4 and magnesium >2. Her QTC was improving.  17. Septic shock: Resolved. Due to bilateral leg cellulitis. Required vasopressors in ICU.  DVT Prophylaxis: On warfarin Code Status: Full Family Communication: Discussed with patient and her sister Kieth Brightly at bedside.    Disposition Plan: Unclear disposition. Medically she is stable. CM and CSW working with patient  and family.   Consultants:  General surgery   Cardiology  Critical care medicine-signed off  Nephrology-signed off  WOC  Palliative Care   Procedures:  Central venous catheter insertion procedure on 09/10/2014   Hydrotherapy by PT  IJ PICC line 10/20   Subjective: Pain is improved. No other complaints. Hopeful for some resolution to her placement issue.  Objective: Filed Vitals:   10/22/14 0700 10/22/14 1402 10/22/14 2206 10/23/14 0422  BP: 101/59 98/58 95/54  109/90  Pulse: 104 114 100 110  Temp: 98.2 F (36.8 C) 97.6 F (36.4 C) 98.8 F (37.1 C) 98 F (36.7 C)  TempSrc: Oral Oral Oral Oral  Resp: 18 18 18 18   Height:      Weight:      SpO2: 95% 96% 93% 95%    Intake/Output Summary (Last 24 hours) at 10/23/14 1005 Last data filed at 10/23/14 0424  Gross per 24 hour  Intake    720 ml  Output   1250 ml  Net   -530 ml   Filed Weights   10/08/14 0423  Weight: 136.533 kg (301 lb)     Exam:  General exam: morbidly obese female lying comfortably in bed.  Respiratory system: Decreased air entry at bases but clear to auscultation. No increased work of breathing. Cardiovascular system: S1 & S2 heard, RRR. No JVD, murmurs, gallops, clicks. 2+ pitting bilateral leg edema/anasarca-leg edema appears chronic.  Gastrointestinal system: Abdomen is nondistended, soft and nontender. Normal bowel sounds heard. Central nervous system: Alert and oriented. No focal neurological deficits. Extremities: Symmetric 5 x 5 power. Multiple bilateral lower extremity wounds-details as per daily PT note.    Data Reviewed: Basic Metabolic Panel:  Recent Labs Lab 10/19/14 1336 10/20/14 0700 10/21/14 0525 10/22/14 0500 10/23/14 0545  NA 136* 138 135* 132* 131*  K 2.8* 3.6* 3.2* 3.4* 3.3*  CL 89* 91* 90* 88* 87*  CO2 34* 33* 35* 36* 35*  GLUCOSE 151* 138* 148* 143* 129*  BUN  87* 85* 81* 82* 81*  CREATININE 2.24* 2.16* 2.07* 1.98* 2.02*  CALCIUM 7.7* 7.9* 7.8* 7.8* 7.9*  MG 1.7  --   --   --   --     CBC:  Recent Labs Lab 10/18/14 0354  WBC 11.9*  HGB 8.4*  HCT 26.0*  MCV 97.7  PLT 278   BNP (last 3 results)  Recent Labs  09/30/14 0520  PROBNP 8120.0*   CBG:  Recent Labs Lab 10/22/14 0704 10/22/14 1058 10/22/14 1611 10/22/14 2202 10/23/14 0621  GLUCAP 142* 112* 170* 241* 133*    Recent Results (from the past 240 hour(s))  URINE CULTURE     Status: None   Collection Time    10/13/14  2:52 PM      Result Value Ref Range Status   Specimen Description URINE, RANDOM   Final   Special Requests ADDED [id number]   Final   Culture  Setup Time     Final   Value: 10/13/2014 22:08     Performed at [place] Count     Final   Value: >=100,000 COLONIES/ML     Performed at [facility]   Culture     Final   Value: KLEBSIELLA PNEUMONIAE     Performed at [facility]   Report Status 10/16/2014 FINAL   Final   Organism ID, Bacteria KLEBSIELLA PNEUMONIAE   Final  MRSA PCR SCREENING     Status: None  Collection Time    10/18/14  9:12 AM      Result Value Ref Range Status   MRSA by PCR NEGATIVE  NEGATIVE Final   Comment:            The GeneXpert MRSA Assay (FDA     approved for NASAL specimens     only), is one component of a     comprehensive MRSA colonization     surveillance program. It is not     intended to diagnose MRSA     infection nor to guide or     monitor treatment for     MRSA infections.        Studies: No results found.   Scheduled Meds: . amiodarone  400 mg Oral Daily  . calcitRIOL  0.25 mcg Oral BID  . calcium-vitamin D  1 tablet Oral BID  . collagenase   Topical TID  . docusate sodium  100 mg Oral BID  . febuxostat  80 mg Oral Daily  .  HYDROmorphone (DILAUDID) injection  2 mg Intravenous Daily  . insulin aspart  0-15 Units Subcutaneous TID WC  . insulin aspart  0-5  Units Subcutaneous QHS  . insulin aspart  2 Units Subcutaneous TID WC  . insulin glargine  22 Units Subcutaneous QHS  . iron polysaccharides  150 mg Oral BID  . levothyroxine  125 mcg Oral QAC breakfast  . linagliptin  5 mg Oral Daily  . metolazone  2.5 mg Oral QODAY  . multivitamin with minerals  1 tablet Oral Daily  . OxyCODONE  15 mg Oral Q12H  . potassium chloride  40 mEq Oral Daily  . saccharomyces boulardii  250 mg Oral BID  . silver nitrate applicators  1 application Topical Once  . sodium chloride  3 mL Intravenous Q12H  . torsemide  60 mg Oral BID  . 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   Obesity hypoventilation syndrome-    Cardiomyopathy, nonischemic- EF 30-35% June 2014   Type 2 diabetes mellitus   Chronic combined systolic and diastolic CHF   Long term (current) use of anticoagulants   CKD (chronic kidney disease) stage 4, GFR 15-29 ml/min   PAF- recurrent despite Amiodarone and multiple cardioversions   Mitral insufficiency, moderate to severe   Chronic massive bilat LE lymphadema   Fall at home- 3 falls this summer, sound orthostatic   Hypotension   Acute on chronic renal failure   Coarse tremors   DNR (do not resuscitate) discussion   Palliative care encounter   Weakness generalized   UTI (urinary tract infection)    Time spent: 20 minutes.    Bonnielee Haff, MD, [facility] Pager [phone]  If 7PM-7AM, please contact night-coverage [email or web address] Password [email or web address]  10/23/2014, 10:05 AM    LOS: 44 days

## 2014-10-23 NOTE — Progress Notes (Signed)
ANTICOAGULATION CONSULT NOTE - Follow Up Consult  Pharmacy Consult for Warfarin Indication: atrial fibrillation  Recent Labs  10/21/14 0525 10/22/14 0500 10/23/14 0545  LABPROT 29.7* 29.4* 32.5*  INR 2.79* 2.76* 3.14*  CREATININE 2.07* 1.98* 2.02*    Estimated Creatinine Clearance: 39.5 ml/min (by C-G formula based on Cr of 2.02).   Assessment: 64 yo female who continues on warfarin for hx Afib with a supra- therapeutic INR .  No bleeding noted  PTA dose 5 mg on TTSS, 2.5 mg on MWF.  Goal of Therapy:  INR 2-3  Plan:  1. No coumadin today 2. Daily PT/INR  Thanks for allowing pharmacy to be a part of this patient's care.  Excell Seltzer, PharmD Clinical Pharmacist, [phone] 10/23/2014 11:11 AM

## 2014-10-23 NOTE — Progress Notes (Signed)
Physical Therapy Wound Treatment Patient Details  Name: [name] MRN: [id number] Date of Birth: 02/28/1950  Today's Date: 10/23/2014 Time: 8144-8185 Time Calculation (min): 79 min  Subjective  Subjective: How do you get them to not try to see me for dressing changes 3 times a day. Patient and Family Stated Goals: decr pain in wounds and heal without surgery  Pain Score: Pain Score: 8   Wound Assessment  Pressure Ulcer 09/10/14 Stage III -  Full thickness tissue loss. Subcutaneous fat may be visible but bone, tendon or muscle are NOT exposed. 3.5 X 2 (Active)  Dressing Type ABD 10/23/2014  8:17 AM  Dressing Changed 10/23/2014  3:00 AM  Dressing Change Frequency Other (Comment) 10/22/2014  8:50 AM  State of Healing Early/partial granulation 09/26/2014 11:00 AM  Site / Wound Assessment Dressing in place / Unable to assess 10/21/2014  8:05 PM  % Wound base Red or Granulating 100% 09/29/2014  8:00 PM  % Wound base Yellow 75% 09/26/2014 11:00 AM  % Wound base Black 0% 09/26/2014 11:00 AM  % Wound base Other (Comment) 0% 09/26/2014 11:00 AM  Peri-wound Assessment Intact;Pink 09/26/2014 11:00 AM  Wound Length (cm) 3.5 cm 09/11/2014  8:00 AM  Wound Width (cm) 2 cm 09/11/2014  8:00 AM  Margins Unattached edges (unapproximated) 10/22/2014  8:50 AM  Drainage Amount Copious 10/23/2014  8:17 AM  Drainage Description Sanguineous 10/23/2014  8:17 AM  Treatment Cleansed;Other (Comment) 10/01/2014  9:30 AM     Pressure Ulcer 09/20/14 Deep Tissue Injury - Purple or maroon localized area of discolored intact skin or blood-filled blister due to damage of underlying soft tissue from pressure and/or shear. (Active)  Dressing Type ABD 10/23/2014  8:17 AM  Dressing Changed 10/23/2014  3:00 AM  Dressing Change Frequency Other (Comment) 10/22/2014  8:50 AM  State of Healing Non-healing 10/23/2014  8:17 AM  Site / Wound Assessment Dressing in place / Unable to assess 10/23/2014  8:17 AM  % Wound base Red or  Granulating 90% 09/29/2014  8:04 PM  % Wound base Yellow 10% 09/29/2014  8:04 PM  Margins Unattached edges (unapproximated) 09/29/2014  8:04 PM  Drainage Amount Scant 10/23/2014  8:17 AM  Drainage Description Serosanguineous 10/23/2014  8:17 AM  Treatment Cleansed;Other (Comment) 09/29/2014  8:04 PM     Pressure Ulcer 09/20/14 Deep Tissue Injury - Purple or maroon localized area of discolored intact skin or blood-filled blister due to damage of underlying soft tissue from pressure and/or shear. (Active)  Dressing Type Gauze (Comment);ABD;Mesh briefs;Other (Comment) 10/23/2014  3:21 PM  Dressing Changed 10/23/2014  3:21 PM  Dressing Change Frequency Other (Comment) 10/23/2014  3:21 PM  State of Healing Eschar 10/23/2014  3:21 PM  Site / Wound Assessment Yellow;Pink;Painful 10/23/2014  3:21 PM  % Wound base Red or Granulating 15% 10/23/2014  3:21 PM  % Wound base Yellow 80% 10/23/2014  3:21 PM  % Wound base Black 5% 10/23/2014  3:21 PM  % Wound base Other (Comment) 0% 10/23/2014  3:21 PM  Peri-wound Assessment Induration;Erythema (blanchable);Edema 10/23/2014  3:21 PM  Wound Length (cm) 5 cm 10/22/2014  3:56 PM  Wound Width (cm) 8 cm 10/22/2014  3:56 PM  Wound Depth (cm) 3.5 cm 10/22/2014  3:56 PM  Margins Unattached edges (unapproximated) 10/23/2014  3:21 PM  Drainage Amount Copious 10/23/2014  3:21 PM  Drainage Description Serosanguineous 10/23/2014  3:21 PM  Treatment Cleansed;Debridement (Selective);Hydrotherapy (Pulse lavage);Packing (Dry gauze) 10/23/2014  3:21 PM     Wound 06/02/13  Abrasion(s) Leg Left (Active)  Dressing Type ABD;Gauze (Comment);Mesh briefs 10/22/2014  8:50 AM  Dressing Changed Changed 10/13/2014 11:45 PM  Dressing Status Clean;Dry;Intact 10/21/2014  8:05 PM  Dressing Change Frequency Daily 10/15/2014  3:28 PM  Site / Wound Assessment Dressing in place / Unable to assess 10/21/2014  8:05 PM  % Wound base Red or Granulating 100% 10/15/2014  3:28 PM  % Wound base  Yellow 0% 10/15/2014  3:28 PM  % Wound base Black 0% 10/15/2014  3:28 PM  % Wound base Other (Comment) 0% 10/15/2014  3:28 PM  Peri-wound Assessment Intact;Edema;Erythema (blanchable) 10/15/2014  3:28 PM  Margins Unattached edges (unapproximated) 10/15/2014  3:28 PM  Closure None 10/15/2014  3:28 PM  Drainage Amount Moderate 10/15/2014  3:28 PM  Drainage Description Serous 10/15/2014  3:28 PM  Non-staged Wound Description Full thickness 10/15/2014  3:28 PM  Treatment Hydrotherapy (Pulse lavage) 10/15/2014  3:28 PM     Wound / Incision (Open or Dehisced) 07/24/14 Incision - Open Leg Left (Active)     Wound / Incision (Open or Dehisced) 07/24/14 Incision - Open Thigh Right scabbed over circular area (Active)     Wound / Incision (Open or Dehisced) 07/29/14 Other (Comment) Thigh Left;Medial HYDRO (Active)     Wound / Incision (Open or Dehisced) 08/02/14 Other (Comment) Thigh Right;Medial Hydro, R medial thigh wound (Active)     Wound / Incision (Open or Dehisced) 09/10/14 Other (Comment) Thigh Medial;Left;Posterior;Proximal (Active)  Dressing Type Gauze (Comment);ABD;Mesh briefs 10/23/2014  3:21 PM  Dressing Changed Changed 10/23/2014  3:21 PM  Dressing Status Clean;Dry;Intact 10/23/2014  3:21 PM  Dressing Change Frequency Other (Comment) 10/23/2014  3:21 PM  Site / Wound Assessment Yellow;Painful;Pink;Bleeding 10/23/2014  3:21 PM  % Wound base Red or Granulating 15% 10/23/2014  3:21 PM  % Wound base Yellow 85% 10/23/2014  3:21 PM  % Wound base Black 0% 10/23/2014  3:21 PM  % Wound base Other (Comment) 0% 10/23/2014  3:21 PM  Peri-wound Assessment Induration;Edema;Excoriated 10/23/2014  3:21 PM  Wound Length (cm) 7.5 cm 10/22/2014  3:56 PM  Wound Width (cm) 7 cm 10/22/2014  3:56 PM  Wound Depth (cm) 4 cm 10/22/2014  3:56 PM  Margins Unattached edges (unapproximated) 10/23/2014  3:21 PM  Closure None 10/23/2014  3:21 PM  Drainage Amount Copious 10/23/2014  3:21 PM  Drainage  Description Serosanguineous;Odor 10/23/2014  3:21 PM  Non-staged Wound Description Full thickness 10/23/2014  3:21 PM  Treatment Cleansed;Debridement (Selective);Hydrotherapy (Pulse lavage);Packing (Dry gauze) 10/23/2014  3:21 PM     Wound / Incision (Open or Dehisced) 09/18/14 Other (Comment) Hip Right Black eschar tissue surrounded by pink tissue (Active)  Dressing Type Gauze (Comment);ABD;Mesh briefs 10/23/2014  3:21 PM  Dressing Changed Changed 10/23/2014  3:21 PM  Dressing Status Clean;Dry;Intact 10/23/2014  3:21 PM  Dressing Change Frequency Other (Comment) 10/23/2014  3:21 PM  Site / Wound Assessment Yellow;Painful;Black;Pink 10/23/2014  3:21 PM  % Wound base Red or Granulating 20% 10/23/2014  3:21 PM  % Wound base Yellow 75% 10/23/2014  3:21 PM  % Wound base Black 5% 10/23/2014  3:21 PM  % Wound base Other (Comment) 0% 10/23/2014  3:21 PM  Peri-wound Assessment Edema;Induration 10/23/2014  3:21 PM  Wound Length (cm) 10 cm 10/22/2014  3:56 PM  Wound Width (cm) 8 cm 10/22/2014  3:56 PM  Wound Depth (cm) 2 cm 10/22/2014  3:56 PM  Undermining (cm) 1.5 10/22/2014  3:56 PM  Margins Unattached edges (unapproximated) 10/23/2014  3:21 PM  Closure None 10/23/2014  3:21 PM  Drainage Amount Copious 10/23/2014  3:21 PM  Drainage Description Serosanguineous 10/23/2014  3:21 PM  Non-staged Wound Description Full thickness 10/23/2014  3:21 PM  Treatment Cleansed;Debridement (Selective);Hydrotherapy (Pulse lavage);Packing (Dry gauze) 10/23/2014  3:21 PM     Incision 05/05/12 Perineum Other (Comment) (Active)   Hydrotherapy Pulsed lavage therapy - wound location: Rt lateral hip, Lt lateral thigh, Lt proximal-posteriomedial thigh Pulsed Lavage with Suction (psi): 8 psi Pulsed Lavage with Suction - Normal Saline Used: 2000 mL Pulsed Lavage Tip: Tip with splash shield Selective Debridement Selective Debridement - Location: Rt lateral hip, Lt lateral thigh, Lt proximal-posteriomedial  thigh Selective Debridement - Tools Used: Forceps;Scalpel;Scissors Selective Debridement - Tissue Removed: yellow/brown eschar, yellow slough, fatty tissue   Wound Assessment and Plan  Wound Therapy - Assess/Plan/Recommendations Wound Therapy - Clinical Statement: Pt making very slow progress with hydrotherapy due to not able to give her enough meds to handle the pain of debridement. Wound Therapy - Functional Problem List: painful wounds limiting her mobility tolerance Factors Delaying/Impairing Wound Healing: Diabetes Mellitus;Infection - systemic/local;Immobility;Multiple medical problems;Vascular compromise Hydrotherapy Plan: Debridement;Dressing change;Patient/family education;Pulsatile lavage with suction Wound Therapy - Frequency: 6X / week Wound Therapy - Follow Up Recommendations: Skilled nursing facility Wound Plan: see above  Wound Therapy Goals- Improve the function of patient's integumentary system by progressing the wound(s) through the phases of wound healing (inflammation - proliferation - remodeling) by: Decrease Necrotic Tissue to: <75% Decrease Necrotic Tissue - Progress: Progressing toward goal Increase Granulation Tissue to: >25% Increase Granulation Tissue - Progress: Progressing toward goal Improve Drainage Characteristics: Min Improve Drainage Characteristics - Progress: Not progressing Goals/treatment plan/discharge plan were made with and agreed upon by patient/family: Yes Time For Goal Achievement: 7 days Wound Therapy - Potential for Goals: Good  Goals will be updated until maximal potential achieved or discharge criteria met.  Discharge criteria: when goals achieved, discharge from hospital, MD decision/surgical intervention, no progress towards goals, refusal/missing three consecutive treatments without notification or medical reason.  GP     Jillisa Harris, Tessie Fass 10/23/2014, 3:27 PM 10/23/2014  Donnella Sham, [place] [phone]   (pager)

## 2014-10-24 LAB — BASIC METABOLIC PANEL
Anion gap: 12 (ref 5–15)
BUN: 80 mg/dL — AB (ref 6–23)
CO2: 33 mEq/L — ABNORMAL HIGH (ref 19–32)
Calcium: 7.6 mg/dL — ABNORMAL LOW (ref 8.4–10.5)
Chloride: 88 mEq/L — ABNORMAL LOW (ref 96–112)
Creatinine, Ser: 2.13 mg/dL — ABNORMAL HIGH (ref 0.50–1.10)
GFR, EST AFRICAN AMERICAN: 27 mL/min — AB (ref >90)
GFR, EST NON AFRICAN AMERICAN: 23 mL/min — AB (ref >90)
Glucose, Bld: 232 mg/dL — ABNORMAL HIGH (ref 70–99)
Potassium: 3.7 mEq/L (ref 3.7–5.3)
Sodium: 133 mEq/L — ABNORMAL LOW (ref 137–147)

## 2014-10-24 LAB — GLUCOSE, CAPILLARY
Glucose-Capillary: 215 mg/dL — ABNORMAL HIGH (ref 70–99)
Glucose-Capillary: 174 mg/dL — ABNORMAL HIGH (ref 70–99)
Glucose-Capillary: 171 mg/dL — ABNORMAL HIGH (ref 70–99)

## 2014-10-24 LAB — PROTIME-INR
INR: 3.29 — ABNORMAL HIGH (ref 0.00–1.49)
PROTHROMBIN TIME: 33.7 s — AB (ref 11.6–15.2)

## 2014-10-24 NOTE — Progress Notes (Addendum)
Patient refused morning dressing change again this morning. Requested to be changed after lunch. Patient also refused to be turned. She stated that the bed is on a special setting that she can feel. Sister verified that she had someone come in and teach her how to use the bed. Will continue to monitor. Glade Nurse, RN

## 2014-10-24 NOTE — Progress Notes (Signed)
PROGRESS NOTE    MALCOLM HETZ JME:[id number] DOB: 1950/02/22 DOA: 09/09/2014  PCP: Elby Showers, MD  Brief narrative 64 y.o. female with history of Morbid obesity, CKD 4, fibromyalgia, hypothyroidism, solitary kidney, hypothyroidism, diabetes, nonischemic cardiomyopathy with ejection fraction of 35%, atrial fibrillation who was admitted on 9/10 w/ recurrent Lower extremity cellulitis, immediately went into Septic shock- was in ICU on pressors until 9/14. Subsequently transferred to the Floor. Has completed antibiotics. Undergoing wound care/hydrotherapy per PT under CCS supervision. Cardiology was following as well. Palliative care team was consulted as well. Discharge disposition remains unclear.   Assessment/Plan:  1. Multiple bilateral lower extremity necrotic wounds: Completed antibiotic course. Surgery & WOC followup appreciated. Continue hydrotherapy with PT. She will need OP follow up with plastic surgery. Significant pain during wound care- much better controlled with Dilaudid 2 mg IV x1 prior to dressing changes. General surgery has been following but doesn't have anything more to offer surgically. Patient has ATC pain. She was started on LA Oxycontin as it was felt she might benefit from being on long acting pain medications. Seems to be helping though it is too early to say. Dose was increased slightly 10/23. Oral Dilaudid for breakthrough. IV dilaudid only if oral doesn't help. 2. Acute on chronic combined systolic and diastolic CHF/anasarca: Seems to be improved. Difficult to assess volume status despite large volume diuresis. Weights have been inaccurate and not regularly recorded. But she seems to have lost about 20kg during her current stay. She seems reasonably well compensated currently. Cardiology switched her to oral diuretics-torsemide and metolazone. We initially held metolazone for increasing creatinine. Restarted the metolazone back on 10/20. Marked hypoalbuminemia also  contributing to anasarca. Swelling of bilateral legs probably from chronic venous stasis. Cardiology signed off. Patient continues to improve. Please call the [facility] office when she is ready for discharge and cardiology will arrange a f/u appt with Dr Sallyanne Kuster or his PA/NP.    3. A. Fib/s/p PPM: Controlled ventricular rate. Coumadin per pharmacy.  4. Acute on stage IV chronic kidney disease: Seems to be close to baseline now. Baseline creatinine probably in the low 2 range. Follow BMP periodically. 5. 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 Continue Lantus, NovoLog mealtime and SSI. 6. Hypothyroidism: Continue levothyroxine. Recent TSH 2 on 05/28/14 7. History of gout: Stable  8. History of OHS/morbid obesity: Nightly CPAP 9. Tremors: Gabapentin discontinued. Seems to have resolved.  10. RUE swelling: Dopplers negative for DVT. Likely from anasarca. 11. Anemia: Secondary to chronic kidney disease, acute illness and iron deficiency: Stable 12. Hypokalemia/Hyponatremia:  On daily KCL. Sodium levels lower but stable. Monitor for now.  13. Fecal impaction/Constipation: Significant relief after digital disimpaction and enema 10/13. She wishes to continue Colace but declines MiraLAX. Likely multifactorial from immobility, pain medications, poor diet and iron supplements. 14. Fungal rash (of back and back of arms): Was extensive. Brief course of by mouth fluconazole-DC ed after 5 days. Improved. 15. UTI: Urine cultures show klebsiella sensitive for rocephin. She completed 7 days of antibiotics on 10/20. 16. Prolonged QTC: Try to keep potassium greater than >4 and magnesium >2. Her QTC was improving.  17. Septic shock: Resolved. Due to bilateral leg cellulitis. Required vasopressors in ICU.  DVT Prophylaxis: On warfarin Code Status: Full Family Communication: Discussed with patient.  Disposition Plan: Unclear disposition. Medically she is stable. CM and CSW  working with patient and family.   Consultants:  General surgery   Cardiology  Critical care  medicine-signed off  Nephrology-signed off  [place]  Palliative Care   Procedures:  Central venous catheter insertion procedure on 09/10/2014   Hydrotherapy by PT  IJ PICC line 10/20   Subjective: Pain is improving. Denies other complaints. Hopeful for some resolution to her placement issue.  Objective: Filed Vitals:   10/22/14 2206 10/23/14 0422 10/23/14 2052 10/24/14 0443  BP: 95/54 109/90 95/52 90/49   Pulse: 100 110 105 100  Temp: 98.8 F (37.1 C) 98 F (36.7 C) 98 F (36.7 C) 98.2 F (36.8 C)  TempSrc: Oral Oral Oral Oral  Resp: 18 18  20   Height:      Weight:      SpO2: 93% 95% 94% 93%    Intake/Output Summary (Last 24 hours) at 10/24/14 0907 Last data filed at 10/24/14 0447  Gross per 24 hour  Intake      0 ml  Output   1300 ml  Net  -1300 ml   Filed Weights   10/08/14 0423  Weight: 136.533 kg (301 lb)     Exam:  General exam: morbidly obese female lying comfortably in bed.  Respiratory system: Decreased air entry at bases but clear to auscultation. Cardiovascular system: S1 & S2 heard, RRR. No JVD, murmurs, gallops, clicks. 2+ pitting bilateral leg edema/anasarca-leg edema appears chronic.  Gastrointestinal system: Abdomen is nondistended, soft and nontender. Normal bowel sounds heard. Central nervous system: Alert and oriented. No focal neurological deficits. Extremities: Multiple bilateral lower extremity wounds-details as per daily PT note.    Data Reviewed: Basic Metabolic Panel:  Recent Labs Lab 10/19/14 1336 10/20/14 0700 10/21/14 0525 10/22/14 0500 10/23/14 0545 10/24/14 0500  NA 136* 138 135* 132* 131* 133*  K 2.8* 3.6* 3.2* 3.4* 3.3* 3.7  CL 89* 91* 90* 88* 87* 88*  CO2 34* 33* 35* 36* 35* 33*  GLUCOSE 151* 138* 148* 143* 129* 232*  BUN 87* 85* 81* 82* 81* 80*  CREATININE 2.24* 2.16* 2.07* 1.98* 2.02* 2.13*  CALCIUM 7.7* 7.9*  7.8* 7.8* 7.9* 7.6*  MG 1.7  --   --   --   --   --     CBC:  Recent Labs Lab 10/18/14 0354  WBC 11.9*  HGB 8.4*  HCT 26.0*  MCV 97.7  PLT 278   BNP (last 3 results)  Recent Labs  09/30/14 0520  PROBNP 8120.0*   CBG:  Recent Labs Lab 10/22/14 2202 10/23/14 0621 10/23/14 1119 10/23/14 1633 10/23/14 2207  GLUCAP 241* 133* 106* 153* 195*    Recent Results (from the past 240 hour(s))  MRSA PCR SCREENING     Status: None   Collection Time    10/18/14  9:12 AM      Result Value Ref Range Status   MRSA by PCR NEGATIVE  NEGATIVE Final   Comment:            The GeneXpert MRSA Assay (FDA     approved for NASAL specimens     only), is one component of a     comprehensive MRSA colonization     surveillance program. It is not     intended to diagnose MRSA     infection nor to guide or     monitor treatment for     MRSA infections.        Studies: No results found.   Scheduled Meds: . amiodarone  400 mg Oral Daily  . calcitRIOL  0.25 mcg Oral BID  . calcium-vitamin D  1 tablet  Oral BID  . collagenase   Topical TID  . docusate sodium  100 mg Oral BID  . febuxostat  80 mg Oral Daily  .  HYDROmorphone (DILAUDID) injection  2 mg Intravenous Daily  . insulin aspart  0-15 Units Subcutaneous TID WC  . insulin aspart  0-5 Units Subcutaneous QHS  . insulin aspart  2 Units Subcutaneous TID WC  . insulin glargine  22 Units Subcutaneous QHS  . iron polysaccharides  150 mg Oral BID  . levothyroxine  125 mcg Oral QAC breakfast  . linagliptin  5 mg Oral Daily  . metolazone  2.5 mg Oral QODAY  . multivitamin with minerals  1 tablet Oral Daily  . OxyCODONE  15 mg Oral Q12H  . potassium chloride  40 mEq Oral Daily  . saccharomyces boulardii  250 mg Oral BID  . silver nitrate applicators  1 application Topical Once  . sodium chloride  3 mL Intravenous Q12H  . torsemide  60 mg Oral BID  . 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   Obesity hypoventilation syndrome-    Cardiomyopathy, nonischemic- EF 30-35% June 2014   Type 2 diabetes mellitus   Chronic combined systolic and diastolic CHF   Long term (current) use of anticoagulants   CKD (chronic kidney disease) stage 4, GFR 15-29 ml/min   PAF- recurrent despite Amiodarone and multiple cardioversions   Mitral insufficiency, moderate to severe   Chronic massive bilat LE lymphadema   Fall at home- 3 falls this summer, sound orthostatic   Hypotension   Acute on chronic renal failure   Coarse tremors   DNR (do not resuscitate) discussion   Palliative care encounter   Weakness generalized   UTI (urinary tract infection)   Hyponatremia    Time spent: 20 minutes.    Bonnielee Haff, MD, [facility] Pager [phone]  If 7PM-7AM, please contact night-coverage [email or web address] Password [email or web address]  10/24/2014, 9:07 AM    LOS: 45 days

## 2014-10-24 NOTE — Progress Notes (Signed)
ANTICOAGULATION CONSULT NOTE - Follow Up Consult  Pharmacy Consult for Warfarin Indication: atrial fibrillation  Recent Labs  10/22/14 0500 10/23/14 0545 10/24/14 0500  LABPROT 29.4* 32.5* 33.7*  INR 2.76* 3.14* 3.29*  CREATININE 1.98* 2.02* 2.13*    Estimated Creatinine Clearance: 37.5 ml/min (by C-G formula based on Cr of 2.13).   Assessment: 64 yo female who continues on warfarin for hx Afib with a supra- therapeutic INR .  No bleeding noted  PTA dose 5 mg on TTSS, 2.5 mg on MWF.  Goal of Therapy:  INR 2-3  Plan:  1. No coumadin today 2. Daily PT/INR  Thanks for allowing pharmacy to be a part of this patient's care.  Excell Seltzer, PharmD Clinical Pharmacist, [phone] 10/24/2014 11:41 AM

## 2014-10-25 LAB — GLUCOSE, CAPILLARY
GLUCOSE-CAPILLARY: 134 mg/dL — AB (ref 70–99)
GLUCOSE-CAPILLARY: 158 mg/dL — AB (ref 70–99)
Glucose-Capillary: 127 mg/dL — ABNORMAL HIGH (ref 70–99)
Glucose-Capillary: 137 mg/dL — ABNORMAL HIGH (ref 70–99)
Glucose-Capillary: 184 mg/dL — ABNORMAL HIGH (ref 70–99)

## 2014-10-25 LAB — PROTIME-INR
INR: 3.01 — ABNORMAL HIGH (ref 0.00–1.49)
PROTHROMBIN TIME: 31.4 s — AB (ref 11.6–15.2)

## 2014-10-25 MED ORDER — WARFARIN SODIUM 1 MG PO TABS
1.0000 mg | ORAL_TABLET | Freq: Once | ORAL | Status: AC
  Administered 2014-10-25: 1 mg via ORAL
  Filled 2014-10-25: qty 1

## 2014-10-25 NOTE — Progress Notes (Signed)
Pt was pre-medicated for dressing changes, but she became anxious and felt hot and claustrophobic. She stated that she was unable to lay back and turn in order to allow for dressing change. Will try again later.

## 2014-10-25 NOTE — Progress Notes (Signed)
Physical Therapy Wound Treatment Patient Details  Name: [name] MRN: [id number] Date of Birth: 01-Mar-1950  Today's Date: 10/25/2014 Time: 1343-1500 Time Calculation (min): 77 min  Subjective  Patient and Family Stated Goals: decr pain in wounds and heal without surgery  Pain Score: Pain Score: 9   Wound Assessment  Pressure Ulcer 09/10/14 Stage III -  Full thickness tissue loss. Subcutaneous fat may be visible but bone, tendon or muscle are NOT exposed. 3.5 X 2 (Active)  Dressing Type ABD 10/25/2014  8:58 AM  Dressing Clean;Dry;Intact 10/25/2014  8:58 AM  Dressing Change Frequency Other (Comment) 10/22/2014  8:50 AM  State of Healing Early/partial granulation 10/24/2014  3:45 AM  Site / Wound Assessment Dressing in place / Unable to assess 10/25/2014  8:58 AM  % Wound base Red or Granulating 100% 09/29/2014  8:00 PM  % Wound base Yellow 75% 09/26/2014 11:00 AM  % Wound base Black 0% 09/26/2014 11:00 AM  % Wound base Other (Comment) 0% 09/26/2014 11:00 AM  Peri-wound Assessment Intact;Pink 09/26/2014 11:00 AM  Wound Length (cm) 3.5 cm 09/11/2014  8:00 AM  Wound Width (cm) 2 cm 09/11/2014  8:00 AM  Margins Unattached edges (unapproximated) 10/22/2014  8:50 AM  Drainage Amount Copious 10/25/2014  5:30 AM  Drainage Description Serosanguineous 10/25/2014  5:30 AM  Treatment Cleansed 10/25/2014  5:30 AM     Pressure Ulcer 09/20/14 Deep Tissue Injury - Purple or maroon localized area of discolored intact skin or blood-filled blister due to damage of underlying soft tissue from pressure and/or shear. (Active)  Dressing Type ABD;Gauze (Comment) 10/25/2014  8:58 AM  Dressing Clean;Dry;Intact 10/25/2014  8:58 AM  Dressing Change Frequency Other (Comment) 10/22/2014  8:50 AM  State of Healing Non-healing 10/24/2014  9:00 PM  Site / Wound Assessment Dressing in place / Unable to assess 10/25/2014  8:58 AM  % Wound base Red or Granulating 90% 09/29/2014  8:04 PM  % Wound base Yellow 10%  09/29/2014  8:04 PM  Margins Unattached edges (unapproximated) 09/29/2014  8:04 PM  Drainage Amount Scant 10/24/2014  3:45 AM  Drainage Description Serosanguineous 10/24/2014  3:45 AM  Treatment Cleansed;Other (Comment) 09/29/2014  8:04 PM     Pressure Ulcer 09/20/14 Deep Tissue Injury - Purple or maroon localized area of discolored intact skin or blood-filled blister due to damage of underlying soft tissue from pressure and/or shear. (Active)  Dressing Type Gauze (Comment);ABD;Mesh briefs;Other (Comment) 10/25/2014  3:29 PM  Dressing Changed 10/25/2014  3:29 PM  Dressing Change Frequency Other (Comment) 10/25/2014  3:29 PM  State of Healing Eschar 10/25/2014  3:29 PM  Site / Wound Assessment Yellow;Pink;Painful 10/25/2014  3:29 PM  % Wound base Red or Granulating 15% 10/25/2014  3:29 PM  % Wound base Yellow 80% 10/25/2014  3:29 PM  % Wound base Black 5% 10/25/2014  3:29 PM  % Wound base Other (Comment) 0% 10/25/2014  3:29 PM  Peri-wound Assessment Induration;Erythema (blanchable);Edema 10/25/2014  3:29 PM  Wound Length (cm) 5 cm 10/22/2014  3:56 PM  Wound Width (cm) 8 cm 10/22/2014  3:56 PM  Wound Depth (cm) 3.5 cm 10/22/2014  3:56 PM  Margins Unattached edges (unapproximated) 10/25/2014  3:29 PM  Drainage Amount Copious 10/25/2014  3:29 PM  Drainage Description Serosanguineous 10/25/2014  3:29 PM  Treatment Cleansed;Debridement (Selective);Hydrotherapy (Pulse lavage);Packing (Dry gauze) 10/25/2014  3:29 PM     Wound 06/02/13 Abrasion(s) Leg Left (Active)  Dressing Type ABD;Gauze (Comment);Mesh briefs 10/22/2014  8:50 AM  Dressing Changed Changed  10/13/2014 11:45 PM  Dressing Status Clean;Dry;Intact 10/21/2014  8:05 PM  Dressing Change Frequency Daily 10/15/2014  3:28 PM  Site / Wound Assessment Dressing in place / Unable to assess 10/21/2014  8:05 PM  % Wound base Red or Granulating 100% 10/15/2014  3:28 PM  % Wound base Yellow 0% 10/15/2014  3:28 PM  % Wound base Black 0% 10/15/2014   3:28 PM  % Wound base Other (Comment) 0% 10/15/2014  3:28 PM  Peri-wound Assessment Intact;Edema;Erythema (blanchable) 10/15/2014  3:28 PM  Margins Unattached edges (unapproximated) 10/15/2014  3:28 PM  Closure None 10/15/2014  3:28 PM  Drainage Amount Moderate 10/15/2014  3:28 PM  Drainage Description Serous 10/15/2014  3:28 PM  Non-staged Wound Description Full thickness 10/15/2014  3:28 PM  Treatment Hydrotherapy (Pulse lavage) 10/15/2014  3:28 PM     Wound / Incision (Open or Dehisced) 07/24/14 Incision - Open Leg Left (Active)     Wound / Incision (Open or Dehisced) 07/24/14 Incision - Open Thigh Right scabbed over circular area (Active)     Wound / Incision (Open or Dehisced) 07/29/14 Other (Comment) Thigh Left;Medial HYDRO (Active)     Wound / Incision (Open or Dehisced) 08/02/14 Other (Comment) Thigh Right;Medial Hydro, R medial thigh wound (Active)     Wound / Incision (Open or Dehisced) 09/10/14 Other (Comment) Thigh Medial;Left;Posterior;Proximal (Active)  Dressing Type Gauze (Comment);ABD;Mesh briefs 10/25/2014  3:29 PM  Dressing Changed Changed 10/25/2014  3:29 PM  Dressing Status Clean;Dry;Intact 10/25/2014  3:29 PM  Dressing Change Frequency Other (Comment) 10/25/2014  3:29 PM  Site / Wound Assessment Yellow;Painful;Pink;Bleeding 10/25/2014  3:29 PM  % Wound base Red or Granulating 20% 10/25/2014  3:29 PM  % Wound base Yellow 80% 10/25/2014  3:29 PM  % Wound base Black 0% 10/25/2014  3:29 PM  % Wound base Other (Comment) 0% 10/25/2014  3:29 PM  Peri-wound Assessment Induration;Edema;Excoriated 10/25/2014  3:29 PM  Wound Length (cm) 7.5 cm 10/22/2014  3:56 PM  Wound Width (cm) 7 cm 10/22/2014  3:56 PM  Wound Depth (cm) 4 cm 10/22/2014  3:56 PM  Margins Unattached edges (unapproximated) 10/25/2014  3:29 PM  Closure None 10/25/2014  3:29 PM  Drainage Amount Copious 10/25/2014  3:29 PM  Drainage Description Serosanguineous;Odor 10/25/2014  3:29 PM  Non-staged Wound  Description Full thickness 10/25/2014  3:29 PM  Treatment Cleansed;Debridement (Selective);Hydrotherapy (Pulse lavage);Packing (Dry gauze) 10/25/2014  3:29 PM     Wound / Incision (Open or Dehisced) 09/18/14 Other (Comment) Hip Right Black eschar tissue surrounded by pink tissue (Active)  Dressing Type Gauze (Comment);ABD;Mesh briefs 10/25/2014  3:29 PM  Dressing Changed Changed 10/25/2014  5:30 AM  Dressing Status Clean;Dry;Intact 10/25/2014  3:29 PM  Dressing Change Frequency Other (Comment) 10/25/2014  3:29 PM  Site / Wound Assessment Yellow;Painful;Black;Pink 10/25/2014  3:29 PM  % Wound base Red or Granulating 20% 10/25/2014  3:29 PM  % Wound base Yellow 75% 10/25/2014  3:29 PM  % Wound base Black 5% 10/25/2014  3:29 PM  % Wound base Other (Comment) 0% 10/25/2014  3:29 PM  Peri-wound Assessment Edema;Induration 10/25/2014  3:29 PM  Wound Length (cm) 10 cm 10/22/2014  3:56 PM  Wound Width (cm) 8 cm 10/22/2014  3:56 PM  Wound Depth (cm) 2 cm 10/22/2014  3:56 PM  Undermining (cm) 1.5 10/22/2014  3:56 PM  Margins Unattached edges (unapproximated) 10/25/2014  3:29 PM  Closure None 10/25/2014  3:29 PM  Drainage Amount Copious 10/25/2014  3:29 PM  Drainage Description Serosanguineous  10/25/2014  3:29 PM  Non-staged Wound Description Full thickness 10/25/2014  3:29 PM  Treatment Cleansed;Debridement (Selective);Hydrotherapy (Pulse lavage);Packing (Dry gauze) 10/25/2014  3:29 PM     Incision 05/05/12 Perineum Other (Comment) (Active)   Hydrotherapy Pulsed lavage therapy - wound location: Rt lateral hip, Lt lateral thigh, Lt proximal-posteriomedial thigh Pulsed Lavage with Suction (psi): 8 psi Pulsed Lavage with Suction - Normal Saline Used: 2000 mL Pulsed Lavage Tip: Tip with splash shield Selective Debridement Selective Debridement - Location: Rt lateral hip, Lt lateral thigh, Lt proximal-posteriomedial thigh Selective Debridement - Tools Used: Forceps;Scalpel;Scissors Selective  Debridement - Tissue Removed: yellow/brown eschar, yellow slough, fatty tissue   Wound Assessment and Plan  Wound Therapy - Assess/Plan/Recommendations Wound Therapy - Clinical Statement: Pt making very slow progress with hydrotherapy due to not able to give her enough meds to handle the pain of debridement. Wound Therapy - Functional Problem List: painful wounds limiting her mobility tolerance Factors Delaying/Impairing Wound Healing: Diabetes Mellitus;Infection - systemic/local;Immobility;Multiple medical problems;Vascular compromise Hydrotherapy Plan: Debridement;Dressing change;Patient/family education;Pulsatile lavage with suction Wound Therapy - Frequency: 6X / week Wound Therapy - Follow Up Recommendations: Skilled nursing facility Wound Plan: see above  Wound Therapy Goals- Improve the function of patient's integumentary system by progressing the wound(s) through the phases of wound healing (inflammation - proliferation - remodeling) by: Decrease Necrotic Tissue to: <75% Decrease Necrotic Tissue - Progress: Progressing toward goal Increase Granulation Tissue to: >25% Increase Granulation Tissue - Progress: Progressing toward goal Improve Drainage Characteristics: Min Improve Drainage Characteristics - Progress: Progressing toward goal Goals/treatment plan/discharge plan were made with and agreed upon by patient/family: Yes Time For Goal Achievement: 7 days Wound Therapy - Potential for Goals: Good  Goals will be updated until maximal potential achieved or discharge criteria met.  Discharge criteria: when goals achieved, discharge from hospital, MD decision/surgical intervention, no progress towards goals, refusal/missing three consecutive treatments without notification or medical reason.  GP     Jasan Doughtie, Tessie Fass 10/25/2014, 3:31 PM 10/25/2014  Donnella Sham, [place] [phone]  (pager)

## 2014-10-25 NOTE — Progress Notes (Signed)
PROGRESS NOTE    [name] QJJ:[id number] DOB: 10-Aug-1950 DOA: 09/09/2014  PCP: Elby Showers, MD  Brief narrative 64 y.o. female with history of Morbid obesity, CKD 4, fibromyalgia, hypothyroidism, solitary kidney, hypothyroidism, diabetes, nonischemic cardiomyopathy with ejection fraction of 35%, atrial fibrillation who was admitted on 9/10 w/ recurrent Lower extremity cellulitis, immediately went into Septic shock- was in ICU on pressors until 9/14. Subsequently transferred to the Floor. Has completed antibiotics. Undergoing wound care/hydrotherapy per PT under CCS supervision. Cardiology was following as well. Palliative care team was consulted as well. Discharge disposition remains unclear.   Assessment/Plan:  1. Multiple bilateral lower extremity necrotic wounds: Completed antibiotic course. Surgery & WOC followup appreciated. Continue hydrotherapy with PT. She will need OP follow up with plastic surgery. Significant pain during wound care- much better controlled with Dilaudid 2 mg IV x1 prior to dressing changes. General surgery has been following but doesn't have anything more to offer surgically. She was started on LA Oxycontin as it was felt she might benefit from being on long acting pain medications. Seems to be helping. Dose was increased slightly 10/23. Oral Dilaudid for breakthrough. IV dilaudid only if oral doesn't help. 2. Acute on chronic combined systolic and diastolic CHF/anasarca: Seems to be improved. Difficult to assess volume status despite large volume diuresis. Weights have been inaccurate and not regularly recorded. But she seems to have lost about 20kg during her current stay. She seems reasonably well compensated currently. Cardiology switched her to oral diuretics-torsemide and metolazone. We initially held metolazone for increasing creatinine. Restarted the metolazone back on 10/20. Marked hypoalbuminemia also contributing to anasarca. Swelling of bilateral legs  probably from chronic venous stasis. Cardiology signed off. Patient continues to improve. Please call the [facility] office when she is ready for discharge and cardiology will arrange a f/u appt with Dr Sallyanne Kuster or his PA/NP.    3. A. Fib/s/p PPM: Controlled ventricular rate. Coumadin per pharmacy.  4. Acute on stage IV chronic kidney disease: Seems to be close to baseline now. Baseline creatinine probably in the low 2 range. Follow BMP periodically. 5. 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 Continue Lantus, NovoLog mealtime and SSI. 6. Hypothyroidism: Continue levothyroxine. Recent TSH 2 on 05/28/14 7. History of gout: Stable  8. History of OHS/morbid obesity: Nightly CPAP 9. Tremors: Gabapentin discontinued. Seems to have resolved.  10. RUE swelling: Dopplers negative for DVT. Likely from anasarca. 11. Anemia: Secondary to chronic kidney disease, acute illness and iron deficiency: Stable 12. Hypokalemia/Hyponatremia:  On daily KCL. Sodium levels lower but stable. Monitor for now.  13. Fecal impaction/Constipation: Significant relief after digital disimpaction and enema 10/13. She wishes to continue Colace but declines MiraLAX. Likely multifactorial from immobility, pain medications, poor diet and iron supplements. 14. Fungal rash (of back and back of arms): Was extensive. Brief course of by mouth fluconazole-DC ed after 5 days. Improved. 15. UTI: Urine cultures show klebsiella sensitive for rocephin. She completed 7 days of antibiotics on 10/20. 16. Prolonged QTC: Try to keep potassium greater than >4 and magnesium >2. Her QTC was improving.  17. Septic shock: Resolved. Due to bilateral leg cellulitis. Required vasopressors in ICU.  DVT Prophylaxis: On warfarin Code Status: Full Family Communication: Discussed with patient.  Disposition Plan: Unclear disposition. Medically she is stable. CM and CSW working with patient and family.    Consultants:  General surgery   Cardiology  Critical care medicine  Nephrology  WOC  Palliative Care   Procedures:  Central venous catheter insertion procedure on 09/10/2014   Hydrotherapy by PT  IJ PICC line 10/20   Subjective: Pain is better. Denies other complaints.   Objective: Filed Vitals:   10/23/14 2052 10/24/14 0443 10/24/14 2046 10/25/14 0506  BP: 95/52 90/49 88/66  116/58  Pulse: 105 100 109 86  Temp: 98 F (36.7 C) 98.2 F (36.8 C) 97.9 F (36.6 C) 97.5 F (36.4 C)  TempSrc: Oral Oral Oral Oral  Resp:  20 16 18   Height:      Weight:      SpO2: 94% 93% 93% 100%    Intake/Output Summary (Last 24 hours) at 10/25/14 1016 Last data filed at 10/25/14 0600  Gross per 24 hour  Intake    400 ml  Output    250 ml  Net    150 ml   Filed Weights   10/08/14 0423  Weight: 136.533 kg (301 lb)     Exam:  General exam: morbidly obese female lying comfortably in bed.  Respiratory system: Decreased air entry at bases but clear to auscultation. Cardiovascular system: S1 & S2 heard, RRR. 2+ pitting bilateral leg edema/anasarca-leg edema appears chronic.  Gastrointestinal system: Abdomen is nondistended, soft and nontender. Normal bowel sounds heard. Central nervous system: Alert and oriented. No focal neurological deficits. Extremities: Multiple bilateral lower extremity wounds-details as per daily PT note.    Data Reviewed: Basic Metabolic Panel:  Recent Labs Lab 10/19/14 1336 10/20/14 0700 10/21/14 0525 10/22/14 0500 10/23/14 0545 10/24/14 0500  NA 136* 138 135* 132* 131* 133*  K 2.8* 3.6* 3.2* 3.4* 3.3* 3.7  CL 89* 91* 90* 88* 87* 88*  CO2 34* 33* 35* 36* 35* 33*  GLUCOSE 151* 138* 148* 143* 129* 232*  BUN 87* 85* 81* 82* 81* 80*  CREATININE 2.24* 2.16* 2.07* 1.98* 2.02* 2.13*  CALCIUM 7.7* 7.9* 7.8* 7.8* 7.9* 7.6*  MG 1.7  --   --   --   --   --     CBC: No results found for this basename: WBC, NEUTROABS, HGB, HCT, MCV, PLT,  in  the last 168 hours BNP (last 3 results)  Recent Labs  09/30/14 0520  PROBNP 8120.0*   CBG:  Recent Labs Lab 10/24/14 0621 10/24/14 1133 10/24/14 1704 10/24/14 2306 10/25/14 0610  GLUCAP 215* 174* 171* 134* 127*    Recent Results (from the past 240 hour(s))  MRSA PCR SCREENING     Status: None   Collection Time    10/18/14  9:12 AM      Result Value Ref Range Status   MRSA by PCR NEGATIVE  NEGATIVE Final   Comment:            The GeneXpert MRSA Assay (FDA     approved for NASAL specimens     only), is one component of a     comprehensive MRSA colonization     surveillance program. It is not     intended to diagnose MRSA     infection nor to guide or     monitor treatment for     MRSA infections.        Studies: No results found.   Scheduled Meds: . amiodarone  400 mg Oral Daily  . calcitRIOL  0.25 mcg Oral BID  . calcium-vitamin D  1 tablet Oral BID  . collagenase   Topical TID  . docusate sodium  100 mg Oral BID  . febuxostat  80 mg Oral Daily  .  HYDROmorphone (  DILAUDID) injection  2 mg Intravenous Daily  . insulin aspart  0-15 Units Subcutaneous TID WC  . insulin aspart  0-5 Units Subcutaneous QHS  . insulin aspart  2 Units Subcutaneous TID WC  . insulin glargine  22 Units Subcutaneous QHS  . iron polysaccharides  150 mg Oral BID  . levothyroxine  125 mcg Oral QAC breakfast  . linagliptin  5 mg Oral Daily  . metolazone  2.5 mg Oral QODAY  . multivitamin with minerals  1 tablet Oral Daily  . OxyCODONE  15 mg Oral Q12H  . potassium chloride  40 mEq Oral Daily  . saccharomyces boulardii  250 mg Oral BID  . silver nitrate applicators  1 application Topical Once  . sodium chloride  3 mL Intravenous Q12H  . torsemide  60 mg Oral BID  . 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   Obesity hypoventilation syndrome-    Cardiomyopathy, nonischemic- EF 30-35% June 2014   Type 2 diabetes mellitus   Chronic combined systolic and diastolic CHF   Long term (current) use of anticoagulants   CKD (chronic kidney disease) stage 4, GFR 15-29 ml/min   PAF- recurrent despite Amiodarone and multiple cardioversions   Mitral insufficiency, moderate to severe   Chronic massive bilat LE lymphadema   Fall at home- 3 falls this summer, sound orthostatic   Hypotension   Acute on chronic renal failure   Coarse tremors   DNR (do not resuscitate) discussion   Palliative care encounter   Weakness generalized   UTI (urinary tract infection)   Hyponatremia    Time spent: 20 minutes.    Bonnielee Haff, MD, [facility] Pager [phone]  If 7PM-7AM, please contact night-coverage [email or web address] Password [email or web address]  10/25/2014, 10:16 AM    LOS: 46 days

## 2014-10-25 NOTE — Progress Notes (Signed)
[name] for Warfarin Indication: atrial fibrillation  Recent Labs  10/23/14 0545 10/24/14 0500 10/25/14 0456  LABPROT 32.5* 33.7* 31.4*  INR 3.14* 3.29* 3.01*  CREATININE 2.02* 2.13*  --     Estimated Creatinine Clearance: 37.5 ml/min (by C-G formula based on Cr of 2.13).   Assessment: 64 yo female who continues on warfarin for hx Afib, current INR 3, no s/sx bleeding.  PTA dose 5 mg on TTSS, 2.5 mg on MWF  Goal of Therapy:  INR 2-3  Plan:  1. Warfarin 1 mg po x1 2. Daily PT/INR    Hughes Better, PharmD, BCPS Clinical Pharmacist Pager: [phone] 10/25/2014 11:31 AM

## 2014-10-26 ENCOUNTER — Inpatient Hospital Stay
Admission: AD | Admit: 2014-10-26 | Discharge: 2014-11-03 | Disposition: A | Discharge status: Hospice | Payer: Self-pay | Source: Ambulatory Visit | Attending: Internal Medicine | Admitting: Internal Medicine

## 2014-10-26 ENCOUNTER — Other Ambulatory Visit (HOSPITAL_COMMUNITY): Payer: Self-pay

## 2014-10-26 DIAGNOSIS — Z789 Other specified health status: Secondary | ICD-10-CM

## 2014-10-26 DIAGNOSIS — R52 Pain, unspecified: Secondary | ICD-10-CM

## 2014-10-26 DIAGNOSIS — I509 Heart failure, unspecified: Secondary | ICD-10-CM

## 2014-10-26 LAB — CBC
HCT: 24.3 % — ABNORMAL LOW (ref 36.0–46.0)
Hemoglobin: 7.8 g/dL — ABNORMAL LOW (ref 12.0–15.0)
MCH: 31.8 pg (ref 26.0–34.0)
MCHC: 32.1 g/dL (ref 30.0–36.0)
MCV: 99.2 fL (ref 78.0–100.0)
PLATELETS: 239 10*3/uL (ref 150–400)
RBC: 2.45 MIL/uL — AB (ref 3.87–5.11)
RDW: 18.9 % — ABNORMAL HIGH (ref 11.5–15.5)
WBC: 8.6 10*3/uL (ref 4.0–10.5)

## 2014-10-26 LAB — BASIC METABOLIC PANEL
ANION GAP: 10 (ref 5–15)
BUN: 82 mg/dL — ABNORMAL HIGH (ref 6–23)
CALCIUM: 8 mg/dL — AB (ref 8.4–10.5)
CO2: 35 mEq/L — ABNORMAL HIGH (ref 19–32)
CREATININE: 2.2 mg/dL — AB (ref 0.50–1.10)
Chloride: 92 mEq/L — ABNORMAL LOW (ref 96–112)
GFR calc Af Amer: 26 mL/min — ABNORMAL LOW (ref >90)
GFR calc non Af Amer: 22 mL/min — ABNORMAL LOW (ref >90)
Glucose, Bld: 127 mg/dL — ABNORMAL HIGH (ref 70–99)
Potassium: 5.1 mEq/L (ref 3.7–5.3)
Sodium: 137 mEq/L (ref 137–147)

## 2014-10-26 LAB — PROTIME-INR
INR: 2.82 — ABNORMAL HIGH (ref 0.00–1.49)
PROTHROMBIN TIME: 29.9 s — AB (ref 11.6–15.2)

## 2014-10-26 LAB — GLUCOSE, CAPILLARY
GLUCOSE-CAPILLARY: 129 mg/dL — AB (ref 70–99)
Glucose-Capillary: 148 mg/dL — ABNORMAL HIGH (ref 70–99)

## 2014-10-26 MED ORDER — WARFARIN SODIUM 1 MG PO TABS
1.0000 mg | ORAL_TABLET | Freq: Once | ORAL | Status: DC
  Filled 2014-10-26: qty 1

## 2014-10-26 NOTE — Progress Notes (Signed)
Pt transferred to [facility] via bed with RN and unit staff. Report called by [place] RN at [phone].

## 2014-10-26 NOTE — Progress Notes (Signed)
PT Cancellation Note  Patient Details Name: [name] MRN: [id number] DOB: 11/20/1950   Cancelled Treatment:    Reason Eval/Treat Not Completed: Pain limiting ability to participate.     Irwin Brakeman F 10/26/2014, 12:49 PM [facility] [phone] (pager)

## 2014-10-26 NOTE — Clinical Social Work Note (Signed)
Late entry-Patient was discussed at length of stay meeting, patient may be going to [facility] once medically ready.  Awaiting confirmation from [facility] team.  Jones Broom. [place], MSW, [place] 10/26/2014 8:57 AM

## 2014-10-26 NOTE — Progress Notes (Signed)
Report called to [facility] RN.

## 2014-10-26 NOTE — Progress Notes (Signed)
Physical Therapy Wound Treatment Patient Details  Name: [name] MRN: [id number] Date of Birth: Jun 03, 1950  Today's Date: 10/26/2014 Time: 1430-1600 Time Calculation (min): 90 min  Subjective  Subjective: I'm a little scared, but ready to go to  [facility]) Patient and Family Stated Goals: decr pain in wounds and heal without surgery  Pain Score:   SUMMARY as of this date: There are 3 major wounds that we are addressing in hydrotherapy. L LE has 1 on the lateral thigh below the area of the hip. This wound is 5.5cm x 6cm x 4.5cm (HxWxD), depth has increased significantly due to selective debridement. 20% is granulating and 80% is necrotic tissue. We are getting to a point where significantly more viable tissue will be uncovered soon. The L LE also has a 2nd wound on the posterior medial thigh with dimensions 7.5cm x 7 cm x 4 cm (HxWxD). This wound is improving despite being extremely painful during selective debridement and is arguably the furthest along-- 30% is granulating, 70% is necrotic. It too will start to show significantly more viable tissue soon. The R LE has the 3rd major wound at the lateral thigh below the area of the hip. The dimensions of this wound are 10 cm x 10 cm x 2.5 cm (HxWxD). About 25% of this wound in granulating, 70% in necrotic and 5% is black around the edges.  There are 2 small wounds that have been healing ?before admission on the distal medial thigh each leg. We have been changing the gauze only on these 2 small wounds, but the R leg wound needs a little Santyl at this point. The last wound of note is on the posterior medial R thigh. This wound has progressed from heavy bruising to eschar now. We have not started addressing this wound formally, but have covered it with telfa and abd.  It now needs to be addressed by hydro and wound management.  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 Subcutaneous fat may be visible but bone,  tendon or muscle are NOT exposed. 3.5 X 2 (Active)  Dressing Type ABD 10/25/2014  8:00 PM  Dressing Clean;Dry;Intact 10/25/2014  8:00 PM  Dressing Change Frequency Other (Comment) 10/22/2014  8:50 AM  State of Healing Early/partial granulation 10/24/2014  3:45 AM  Site / Wound Assessment Dressing in place / Unable to assess 10/25/2014  8:00 PM  % Wound base Red or Granulating 100% 09/29/2014  8:00 PM  % Wound base Yellow 75% 09/26/2014 11:00 AM  % Wound base Black 0% 09/26/2014 11:00 AM  % Wound base Other (Comment) 0% 09/26/2014 11:00 AM  Peri-wound Assessment Intact;Pink 09/26/2014 11:00 AM  Wound Length (cm) 3.5 cm 09/11/2014  8:00 AM  Wound Width (cm) 2 cm 09/11/2014  8:00 AM  Margins Unattached edges (unapproximated) 10/22/2014  8:50 AM  Drainage Amount Copious 10/25/2014  5:30 AM  Drainage Description Serosanguineous 10/25/2014  5:30 AM  Treatment Cleansed 10/25/2014  5:30 AM     Pressure Ulcer 09/20/14 Deep Tissue Injury - Purple or maroon localized area of discolored intact skin or blood-filled blister due to damage of underlying soft tissue from pressure and/or shear. (Active)  Dressing Type ABD;Gauze (Comment) 10/25/2014  8:00 PM  Dressing Clean;Dry;Intact 10/25/2014  8:00 PM  Dressing Change Frequency Other (Comment) 10/22/2014  8:50 AM  State of Healing Non-healing 10/24/2014  9:00 PM  Site / Wound Assessment Dressing in place / Unable to assess 10/25/2014  8:00 PM  %  Wound base Red or Granulating 90% 09/29/2014  8:04 PM  % Wound base Yellow 10% 09/29/2014  8:04 PM  Margins Unattached edges (unapproximated) 09/29/2014  8:04 PM  Drainage Amount Scant 10/24/2014  3:45 AM  Drainage Description Serosanguineous 10/24/2014  3:45 AM  Treatment Cleansed;Other (Comment) 09/29/2014  8:04 PM     Pressure Ulcer 09/20/14 Deep Tissue Injury - Purple or maroon localized area of discolored intact skin or blood-filled blister due to damage of underlying soft tissue from pressure and/or shear. (Active)   Dressing Type Gauze (Comment);ABD;Mesh briefs;Other (Comment) 10/26/2014  4:12 PM  Dressing Changed 10/26/2014  4:12 PM  Dressing Change Frequency Twice a day 10/26/2014  4:12 PM  State of Healing Eschar 10/26/2014  4:12 PM  Site / Wound Assessment Yellow;Pink;Painful 10/26/2014  4:12 PM  % Wound base Red or Granulating 20% 10/26/2014  4:12 PM  % Wound base Yellow 80% 10/26/2014  4:12 PM  % Wound base Black 0% 10/26/2014  4:12 PM  % Wound base Other (Comment) 0% 10/26/2014  4:12 PM  Peri-wound Assessment Induration;Erythema (blanchable);Edema 10/26/2014  4:12 PM  Wound Length (cm) 5.5 cm 10/26/2014  4:12 PM  Wound Width (cm) 6 cm 10/26/2014  4:12 PM  Wound Depth (cm) 4.5 cm 10/26/2014  4:12 PM  Margins Unattached edges (unapproximated) 10/26/2014  4:12 PM  Drainage Amount Copious 10/26/2014  4:12 PM  Drainage Description Serosanguineous 10/26/2014  4:12 PM  Treatment Cleansed;Debridement (Selective);Hydrotherapy (Pulse lavage);Packing (Dry gauze) 10/26/2014  4:12 PM     Wound 06/02/13 Abrasion(s) Leg Left (Active)  Dressing Type ABD;Gauze (Comment);Mesh briefs 10/22/2014  8:50 AM  Dressing Changed Changed 10/13/2014 11:45 PM  Dressing Status Clean;Dry;Intact 10/21/2014  8:05 PM  Dressing Change Frequency Daily 10/15/2014  3:28 PM  Site / Wound Assessment Dressing in place / Unable to assess 10/21/2014  8:05 PM  % Wound base Red or Granulating 100% 10/15/2014  3:28 PM  % Wound base Yellow 0% 10/15/2014  3:28 PM  % Wound base Black 0% 10/15/2014  3:28 PM  % Wound base Other (Comment) 0% 10/15/2014  3:28 PM  Peri-wound Assessment Intact;Edema;Erythema (blanchable) 10/15/2014  3:28 PM  Margins Unattached edges (unapproximated) 10/15/2014  3:28 PM  Closure None 10/15/2014  3:28 PM  Drainage Amount Moderate 10/15/2014  3:28 PM  Drainage Description Serous 10/15/2014  3:28 PM  Non-staged Wound Description Full thickness 10/15/2014  3:28 PM  Treatment Hydrotherapy (Pulse lavage)  10/15/2014  3:28 PM     Wound / Incision (Open or Dehisced) 07/24/14 Incision - Open Leg Left (Active)     Wound / Incision (Open or Dehisced) 07/24/14 Incision - Open Thigh Right scabbed over circular area (Active)     Wound / Incision (Open or Dehisced) 07/29/14 Other (Comment) Thigh Left;Medial HYDRO (Active)     Wound / Incision (Open or Dehisced) 08/02/14 Other (Comment) Thigh Right;Medial Hydro, R medial thigh wound (Active)     Wound / Incision (Open or Dehisced) 09/10/14 Other (Comment) Thigh Medial;Left;Posterior;Proximal (Active)  Dressing Type Gauze (Comment);ABD;Mesh briefs 10/26/2014  4:12 PM  Dressing Changed Changed 10/26/2014  4:12 PM  Dressing Status Clean;Dry;Intact 10/26/2014  4:12 PM  Dressing Change Frequency Twice a day 10/26/2014  4:12 PM  Site / Wound Assessment Yellow;Painful;Pink;Bleeding 10/26/2014  4:12 PM  % Wound base Red or Granulating 30% 10/26/2014  4:12 PM  % Wound base Yellow 70% 10/26/2014  4:12 PM  % Wound base Black 0% 10/26/2014  4:12 PM  % Wound base Other (Comment) 0% 10/26/2014  4:12 PM  Peri-wound Assessment Induration;Edema;Excoriated 10/26/2014  4:12 PM  Wound Length (cm) 7.5 cm 10/26/2014  4:12 PM  Wound Width (cm) 7 cm 10/26/2014  4:12 PM  Wound Depth (cm) 4 cm 10/26/2014  4:12 PM  Margins Unattached edges (unapproximated) 10/26/2014  4:12 PM  Closure None 10/26/2014  4:12 PM  Drainage Amount Copious 10/26/2014  4:12 PM  Drainage Description Serosanguineous;Odor 10/26/2014  4:12 PM  Non-staged Wound Description Full thickness 10/26/2014  4:12 PM  Treatment Cleansed;Debridement (Selective);Hydrotherapy (Pulse lavage);Packing (Saline gauze) 10/26/2014  4:12 PM     Wound / Incision (Open or Dehisced) 09/18/14 Other (Comment) Hip Right Black eschar tissue surrounded by pink tissue (Active)  Dressing Type Gauze (Comment);ABD;Mesh briefs 10/26/2014  4:12 PM  Dressing Changed Changed 10/26/2014  4:12 PM  Dressing Status Clean;Dry;Intact  10/26/2014  4:12 PM  Dressing Change Frequency Twice a day 10/26/2014  4:12 PM  Site / Wound Assessment Yellow;Painful;Black;Pink 10/26/2014  4:12 PM  % Wound base Red or Granulating 25% 10/26/2014  4:12 PM  % Wound base Yellow 70% 10/26/2014  4:12 PM  % Wound base Black 5% 10/26/2014  4:12 PM  % Wound base Other (Comment) 0% 10/26/2014  4:12 PM  Peri-wound Assessment Edema;Induration 10/26/2014  4:12 PM  Wound Length (cm) 10 cm 10/26/2014  4:12 PM  Wound Width (cm) 10 cm 10/26/2014  4:12 PM  Wound Depth (cm) 2.5 cm 10/26/2014  4:12 PM  Tunneling (cm) 4 cm at 1-2 oclock 10/26/2014  4:12 PM  Undermining (cm) 1.5 10/22/2014  3:56 PM  Margins Unattached edges (unapproximated) 10/26/2014  4:12 PM  Closure None 10/26/2014  4:12 PM  Drainage Amount Copious 10/26/2014  4:12 PM  Drainage Description Serosanguineous 10/26/2014  4:12 PM  Non-staged Wound Description Full thickness 10/26/2014  4:12 PM  Treatment Cleansed;Debridement (Selective);Hydrotherapy (Pulse lavage);Packing (Dry gauze) 10/26/2014  4:12 PM     Incision 05/05/12 Perineum Other (Comment) (Active)   Hydrotherapy Pulsed lavage therapy - wound location: Rt lateral hip, Lt lateral thigh, Lt proximal-posteriomedial thigh Pulsed Lavage with Suction (psi): 8 psi Pulsed Lavage with Suction - Normal Saline Used: 2000 mL Pulsed Lavage Tip: Tip with splash shield Selective Debridement Selective Debridement - Location: Rt lateral hip, Lt lateral thigh, Lt proximal-posteriomedial thigh Selective Debridement - Tools Used: Forceps;Scalpel;Scissors Selective Debridement - Tissue Removed: yellow/brown eschar, yellow slough, fatty tissue   Wound Assessment and Plan  Wound Therapy - Assess/Plan/Recommendations Wound Therapy - Clinical Statement: Pt making very slow progress with hydrotherapy due to not able to give her enough meds to handle the pain of debridement. Wound Therapy - Functional Problem List: painful wounds limiting her mobility  tolerance Factors Delaying/Impairing Wound Healing: Diabetes Mellitus;Infection - systemic/local;Immobility;Multiple medical problems;Vascular compromise Hydrotherapy Plan: Debridement;Dressing change;Patient/family education;Pulsatile lavage with suction Wound Therapy - Frequency: 6X / week Wound Therapy - Follow Up Recommendations: Skilled nursing facility Wound Plan: see above  Wound Therapy Goals- Improve the function of patient's integumentary system by progressing the wound(s) through the phases of wound healing (inflammation - proliferation - remodeling) by: Decrease Necrotic Tissue to: <75% Decrease Necrotic Tissue - Progress: Progressing toward goal Increase Granulation Tissue to: >25% Increase Granulation Tissue - Progress: Progressing toward goal Improve Drainage Characteristics: Min Improve Drainage Characteristics - Progress: Progressing toward goal Goals/treatment plan/discharge plan were made with and agreed upon by patient/family: Yes Time For Goal Achievement: 7 days Wound Therapy - Potential for Goals: Good  Goals will be updated until maximal potential achieved or discharge criteria met.  Discharge criteria:  when goals achieved, discharge from hospital, MD decision/surgical intervention, no progress towards goals, refusal/missing three consecutive treatments without notification or medical reason.  GP     Jahaan Vanwagner, Tessie Fass 10/26/2014, 4:21 PM 10/26/2014  Donnella Sham, [place] [phone]  (pager)

## 2014-10-26 NOTE — Progress Notes (Signed)
NUTRITION FOLLOW UP  INTERVENTION:  Continue snacks TID  Continue MVI daily RD to follow for nutrition care plan  NUTRITION DIAGNOSIS: Increased nutrient needs related to wound healing as evidenced by estimated nutrition needs, ongoing  Goal: Pt to meet >/= 90% of their estimated nutrition needs, progressing  Monitor:  PO intake, weight, labs, I/O's  ASSESSMENT: 64 y.o. Female with history of fibromyalgia, hypothyroidism, solitary kidney, hypothyroidism, diabetes, nonischemic cardiomyopathy with ejection fraction of 35%, atrial fibrillation who presented to ED with left-sided lower leg pain.  Goals of care meeting with Palliative Care Team 10/14.  Pt is a limited code, she does not desire TF.    Wounds receiving hydrotherapy.  No plans for surgical debridement.  PO intake much improved at 75-100% per flowsheet records. Receiving MVI daily and snacks TID.  Disposition: possible LTACH.  Height: Ht Readings from Last 1 Encounters:  09/09/14 5' 6.93" (1.7 m)    Weight: Wt Readings from Last 1 Encounters:  09/02/14 304 lb (137.893 kg)    BMI:  Body mass index is 47.24 kg/(m^2).  Estimated Nutritional Needs: Kcal: 1700-1900 Protein: 100-110 gm Fluid: 1.7-1.9 L  Skin:  Left thigh unstageable wound Left posterior thigh unstageable wound Right hip unstageable wound Left outer calf with partial thickness stasis ulcers Stage II left buttock wound  Diet Order: Heart Healthy/Carbohydrate Modified    Intake/Output Summary (Last 24 hours) at 10/26/14 1108 Last data filed at 10/26/14 0600  Gross per 24 hour  Intake   1210 ml  Output    525 ml  Net    685 ml     Labs:   Recent Labs Lab 10/19/14 1336  10/23/14 0545 10/24/14 0500 10/26/14 0505  NA 136*  < > 131* 133* 137  K 2.8*  < > 3.3* 3.7 5.1  CL 89*  < > 87* 88* 92*  CO2 34*  < > 35* 33* 35*  BUN 87*  < > 81* 80* 82*  CREATININE 2.24*  < > 2.02* 2.13* 2.20*  CALCIUM 7.7*  < > 7.9* 7.6* 8.0*  MG 1.7  --    --   --   --   GLUCOSE 151*  < > 129* 232* 127*  < > = values in this interval not displayed.  CBG (last 3)   Recent Labs  10/25/14 1632 10/25/14 2031 10/26/14 0606  GLUCAP 158* 184* 129*    Scheduled Meds: . amiodarone  400 mg Oral Daily  . calcitRIOL  0.25 mcg Oral BID  . calcium-vitamin D  1 tablet Oral BID  . collagenase   Topical TID  . docusate sodium  100 mg Oral BID  . febuxostat  80 mg Oral Daily  .  HYDROmorphone (DILAUDID) injection  2 mg Intravenous Daily  . insulin aspart  0-15 Units Subcutaneous TID WC  . insulin aspart  0-5 Units Subcutaneous QHS  . insulin aspart  2 Units Subcutaneous TID WC  . insulin glargine  22 Units Subcutaneous QHS  . iron polysaccharides  150 mg Oral BID  . levothyroxine  125 mcg Oral QAC breakfast  . linagliptin  5 mg Oral Daily  . metolazone  2.5 mg Oral QODAY  . multivitamin with minerals  1 tablet Oral Daily  . OxyCODONE  15 mg Oral Q12H  . potassium chloride  40 mEq Oral Daily  . saccharomyces boulardii  250 mg Oral BID  . silver nitrate applicators  1 application Topical Once  . sodium chloride  3 mL Intravenous  Q12H  . torsemide  60 mg Oral BID  . warfarin  1 mg Oral ONCE-1800  . Warfarin - Pharmacist Dosing Inpatient   Does not apply q1800    Continuous Infusions:   Arthur Holms, RD, LDN Pager #: [phone] After-Hours Pager #: [phone]

## 2014-10-26 NOTE — Clinical Social Work Note (Signed)
Patient was discussed in progression about possibly going to [facility].  Awaiting confirmation for LTACH to receive patient.  Jones Broom. [place], MSW, [place] 10/26/2014 8:55 AM

## 2014-10-26 NOTE — Progress Notes (Signed)
ANTICOAGULATION CONSULT NOTE - Follow Up Consult  Pharmacy Consult for Coumadin Indication: atrial fibrillation  Allergies  Allergen Reactions  . Ivp Dye [Iodinated Diagnostic Agents] Shortness Of Breath    Turn red, can't breathe  . Betadine [Povidone Iodine]   . Diphenhydramine Hcl Swelling    In hands and eyes  . Fish Allergy Nausea And Vomiting  . Iohexol      Desc: PT TURNS RED AND WHEEZING   . Penicillins Other (See Comments)    Resp arrest as child. Tolerates cephalosporins.  . Povidone-Iodine Other (See Comments)    Wheezing, turn red, can't breath, and blisters  . Promethazine Hcl Other (See Comments)    Low Blood Pressure  . Tape     Blisters, can use paper tape for short periods  . Clindamycin Hives and Rash    wheezing  . Iodine Rash  . Morphine Sulfate Nausea And Vomiting and Rash  . Primaxin [Imipenem] Rash    Patient Measurements: Height: 5' 6.93" (170 cm) Weight:  (unable to obtain) IBW/kg (Calculated) : 61.44 Heparin Dosing Weight:   Vital Signs: Temp: 97.4 F (36.3 C) (10/27 0615) Temp Source: Oral (10/27 0615) BP: 84/58 mmHg (10/27 0615) Pulse Rate: 98 (10/27 0615)  Labs:  Recent Labs  10/24/14 0500 10/25/14 0456 10/26/14 0505  HGB  --   --  7.8*  HCT  --   --  24.3*  PLT  --   --  239  LABPROT 33.7* 31.4* 29.9*  INR 3.29* 3.01* 2.82*  CREATININE 2.13*  --  2.20*    Estimated Creatinine Clearance: 36.3 ml/min (by C-G formula based on Cr of 2.2).   Medications:  Scheduled:  . amiodarone  400 mg Oral Daily  . calcitRIOL  0.25 mcg Oral BID  . calcium-vitamin D  1 tablet Oral BID  . collagenase   Topical TID  . docusate sodium  100 mg Oral BID  . febuxostat  80 mg Oral Daily  .  HYDROmorphone (DILAUDID) injection  2 mg Intravenous Daily  . insulin aspart  0-15 Units Subcutaneous TID WC  . insulin aspart  0-5 Units Subcutaneous QHS  . insulin aspart  2 Units Subcutaneous TID WC  . insulin glargine  22 Units Subcutaneous QHS  .  iron polysaccharides  150 mg Oral BID  . levothyroxine  125 mcg Oral QAC breakfast  . linagliptin  5 mg Oral Daily  . metolazone  2.5 mg Oral QODAY  . multivitamin with minerals  1 tablet Oral Daily  . OxyCODONE  15 mg Oral Q12H  . potassium chloride  40 mEq Oral Daily  . saccharomyces boulardii  250 mg Oral BID  . silver nitrate applicators  1 application Topical Once  . sodium chloride  3 mL Intravenous Q12H  . torsemide  60 mg Oral BID  . Warfarin - Pharmacist Dosing Inpatient   Does not apply q1800    Assessment: 64yo female with AFib, hg 7.8 and pltc wnl.  No bleeding noted.  Amiodarone was initiated on 10/20 and INR increased, requiring doses to held on 10/24-25.    Goal of Therapy:  INR 2-3 Monitor platelets by anticoagulation protocol: Yes   Plan:  1-  Repeat Coumadin 1mg  2-  F/U in AM  Gracy Bruins, PharmD [facility]

## 2014-10-26 NOTE — Discharge Summary (Signed)
[facility]  Physician Discharge Summary   Patient ID: [name] MRN: [id number] DOB/AGE: 66-10-1950 66 y.o.  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   Obesity hypoventilation syndrome-    Cardiomyopathy, nonischemic- EF 30-35% June 2014   Type 2 diabetes mellitus   Chronic combined systolic and diastolic CHF   Long term (current) use of anticoagulants   CKD (chronic kidney disease) stage 4, GFR 15-29 ml/min   PAF- recurrent despite Amiodarone and multiple cardioversions   Mitral insufficiency, moderate to severe   Chronic massive bilat LE lymphadema   Fall at home- 3 falls this summer, sound orthostatic   Hypotension   Acute on chronic renal failure   Coarse tremors   DNR (do not resuscitate) discussion   Palliative care encounter   Weakness generalized   UTI (urinary tract infection)   Hyponatremia   RECOMMENDATIONS FOR OUTPATIENT FOLLOW UP: 1. Patient being discharged to [place] setting.  2. Check Bmet in AM as K was high normal today. 3. Continue wound care. See PT notes.  DISCHARGE CONDITION: fair  Diet recommendation: Heart Healthy/Carb Modified  Filed Weights   10/08/14 0423  Weight: 136.533 kg (301 lb)    INITIAL HISTORY: 64 y.o. female with history of Morbid obesity, CKD 4, fibromyalgia, hypothyroidism, solitary kidney, hypothyroidism, diabetes, nonischemic cardiomyopathy with ejection fraction of 35%, atrial fibrillation who was admitted on 9/10 w/ recurrent Lower extremity cellulitis, immediately went into Septic shock- was in ICU on pressors until 9/14. Subsequently transferred to the Floor. Has completed antibiotics. Undergoing wound care/hydrotherapy per PT under CCS supervision. Cardiology was following as well. Palliative care  team was consulted as well.   Consultants:  General surgery  Cardiology  Critical care medicine  Nephrology  WOC  Palliative Care   Procedures:  Central venous catheter insertion procedure on 09/10/2014  Hydrotherapy by PT  IJ PICC line 10/20   HOSPITAL COURSE:   Multiple bilateral lower extremity necrotic wounds: She has completed antibiotic course. Surgery & WOC were following. She continues to get hydrotherapy with PT. She will need OP follow up with plastic surgery. She has experienced significant pain during wound care which is relieved with Dilaudid 2 mg IV x1 prior to dressing changes. General surgery has been following but doesn't have anything more to offer surgically. She was started on LA Oxycontin as it was felt she might benefit from being on long acting pain medications. And this seems to be helping. Dose was increased slightly 10/23. Oral Dilaudid for breakthrough. IV dilaudid only if oral doesn't help.  Acute on chronic combined systolic and diastolic CHF/anasarca: Seems to be improved. Difficult to assess volume status despite large volume diuresis. Weights have been inaccurate and not regularly recorded. But she seems to have lost about 20kg during her current stay. She seems reasonably well compensated currently. Cardiology switched her to oral diuretics-torsemide and metolazone. We initially held metolazone for increasing creatinine. Restarted the metolazone back on 10/20. Marked hypoalbuminemia also contributing to anasarca. Swelling of bilateral legs probably from chronic venous stasis. Cardiology has signed off. Patient continues to improve. Please call the [place] office when she is ready for discharge and cardiology will arrange a f/u appt with Dr Sallyanne Kuster or his PA/NP. BP slightly low this Am as  it usually is every AM. She however is asymptomatic.   A. Fib/s/p PPM: Controlled ventricular rate. She has been on Coumadin per pharmacy.   Acute on stage IV chronic  kidney disease: Seems to be close to baseline now. Baseline creatinine probably in the low 2 range. Follow BMP periodically.  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 Continue Lantus, NovoLog mealtime and SSI.  Hypothyroidism: Continue levothyroxine. Recent TSH 2.0 on 05/28/14  History of gout: Stable   History of OHS/morbid obesity: Nightly CPAP with which she hasn't been very compliant at times.  Tremors: Thought to be due to Gabapentin which was discontinued. Seems to have resolved.   RUE swelling: Dopplers negative for DVT. Likely from anasarca.  Anemia: Secondary to chronic kidney disease, acute illness and iron deficiency. Hgb slightly lower today but stable.   Hypokalemia/Hyponatremia: On daily KCL. Sodium levels lower but stable. Monitor for now.   Fecal impaction/Constipation: Significant relief after digital disimpaction and enema 10/13. She wishes to continue Colace but reluctant to MiraLAX. Likely multifactorial from immobility, pain medications, poor diet and iron supplements.  Fungal rash (of back and back of arms): Was extensive. Brief course of by mouth fluconazole-DC ed after 5 days. Improved.  UTI: Urine cultures show klebsiella sensitive for rocephin. She completed 7 days of antibiotics on 10/20.  Prolonged QTC: Try to keep potassium greater than >4 and magnesium >2. Her QTC was improving.   Septic shock: Resolved. Due to bilateral leg cellulitis. Required vasopressors in ICU.   Was notified that her insurance has approved her for LTAC. She can be discharged whenever a bed is available.   PERTINENT LABS: The results of significant diagnostics from this hospitalization (including imaging, microbiology, ancillary and laboratory) are listed below for reference.    Microbiology: Recent Results (from the past 240 hour(s))  MRSA PCR SCREENING     Status: None   Collection Time    10/18/14  9:12 AM      Result Value Ref Range  Status   MRSA by PCR NEGATIVE  NEGATIVE Final   Comment:            The GeneXpert MRSA Assay (FDA     approved for NASAL specimens     only), is one component of a     comprehensive MRSA colonization     surveillance program. It is not     intended to diagnose MRSA     infection nor to guide or     monitor treatment for     MRSA infections.     Labs: Basic Metabolic Panel:  Recent Labs Lab 10/19/14 1336  10/21/14 0525 10/22/14 0500 10/23/14 0545 10/24/14 0500 10/26/14 0505  NA 136*  < > 135* 132* 131* 133* 137  K 2.8*  < > 3.2* 3.4* 3.3* 3.7 5.1  CL 89*  < > 90* 88* 87* 88* 92*  CO2 34*  < > 35* 36* 35* 33* 35*  GLUCOSE 151*  < > 148* 143* 129* 232* 127*  BUN 87*  < > 81* 82* 81* 80* 82*  CREATININE 2.24*  < > 2.07* 1.98* 2.02* 2.13* 2.20*  CALCIUM 7.7*  < > 7.8* 7.8* 7.9* 7.6* 8.0*  MG 1.7  --   --   --   --   --   --   < > = values in this interval not displayed.  CBC:  Recent Labs Lab 10/26/14 0505  WBC 8.6  HGB 7.8*  HCT 24.3*  MCV 99.2  PLT 239   BNP: BNP (last 3 results)  Recent Labs  09/30/14 0520  PROBNP 8120.0*   CBG:  Recent Labs Lab 10/25/14 1121 10/25/14 1632 10/25/14 2031 10/26/14 0606 10/26/14 1124  GLUCAP 137* 158* 184* 129* 148*     IMAGING STUDIES: Numerous imaging studies done during her stay. Please review Epic for same.  DISCHARGE EXAMINATION: Filed Vitals:   10/24/14 2046 10/25/14 0506 10/25/14 2037 10/26/14 0615  BP: 88/66 116/58 95/48 84/58   Pulse: 109 86 102 98  Temp: 97.9 F (36.6 C) 97.5 F (36.4 C) 99.3 F (37.4 C) 97.4 F (36.3 C)  TempSrc: Oral Oral Oral Oral  Resp: 16 18 19 18   Height:      Weight:      SpO2: 93% 100% 88% 99%   General appearance: alert, cooperative, appears stated age, no distress and morbidly obese Resp: clear to auscultation bilaterally Cardio: irregularly irregular rhythm Extremities: edematous with wounds as described by PT.  DISPOSITION: LTAC   ALLERGIES:  Allergies    Allergen Reactions  . Ivp Dye [Iodinated Diagnostic Agents] Shortness Of Breath    Turn red, can't breathe  . Betadine [Povidone Iodine]   . Diphenhydramine Hcl Swelling    In hands and eyes  . Fish Allergy Nausea And Vomiting  . Iohexol      Desc: PT TURNS RED AND WHEEZING   . Penicillins Other (See Comments)    Resp arrest as child. Tolerates cephalosporins.  . Povidone-Iodine Other (See Comments)    Wheezing, turn red, can't breath, and blisters  . Promethazine Hcl Other (See Comments)    Low Blood Pressure  . Tape     Blisters, can use paper tape for short periods  . Clindamycin Hives and Rash    wheezing  . Iodine Rash  . Morphine Sulfate Nausea And Vomiting and Rash  . Primaxin [Imipenem] Rash   Current Inpatient Medications:  Scheduled: . amiodarone  400 mg Oral Daily  . calcitRIOL  0.25 mcg Oral BID  . calcium-vitamin D  1 tablet Oral BID  . collagenase   Topical TID  . docusate sodium  100 mg Oral BID  . febuxostat  80 mg Oral Daily  .  HYDROmorphone (DILAUDID) injection  2 mg Intravenous Daily  . insulin aspart  0-15 Units Subcutaneous TID WC  . insulin aspart  0-5 Units Subcutaneous QHS  . insulin aspart  2 Units Subcutaneous TID WC  . insulin glargine  22 Units Subcutaneous QHS  . iron polysaccharides  150 mg Oral BID  . levothyroxine  125 mcg Oral QAC breakfast  . linagliptin  5 mg Oral Daily  . metolazone  2.5 mg Oral QODAY  . multivitamin with minerals  1 tablet Oral Daily  . OxyCODONE  15 mg Oral Q12H  . potassium chloride  40 mEq Oral Daily  . saccharomyces boulardii  250 mg Oral BID  . silver nitrate applicators  1 application Topical Once  . sodium chloride  3 mL Intravenous Q12H  . torsemide  60 mg Oral BID  . warfarin  1 mg Oral ONCE-1800  . Warfarin - Pharmacist Dosing Inpatient   Does not apply q1800   Continuous:  SKA:[id number], ALPRAZolam, bisacodyl, calcium carbonate, HYDROmorphone (DILAUDID) injection, HYDROmorphone, hydrOXYzine,  nitroGLYCERIN, polyethylene glycol, senna, sodium chloride, sodium chloride    TOTAL DISCHARGE TIME: 35 mins  [facility]  [facility] Pager [phone]  10/26/2014, 12:00 PM

## 2014-10-26 NOTE — Progress Notes (Signed)
Report received from Brittany RN

## 2014-10-26 NOTE — Progress Notes (Signed)
Occupational Therapy Treatment Patient Details Name: FAYRENE TOWNER MRN: [id number] DOB: 08-21-1950 Today's Date: 10/26/2014    History of present illness 64 y.o. female  with history of fibromyalgia, hypothyroidism, solitary kidney, hypothyroidism, diabetes, nonischemic cardiomyopathy with ejection fraction of 35%, atrial fibrillation who presented to the emergency department with left-sided lower leg pain. She was admitted on 9/10 w/ recurrent Lower extremity cellulitis. PCCM asked to see in consult on 9/11 for persistent hypotension and worsening renal failure.  She had a previous admission for LE cellulitis in 07/2014 followed by [facility].  Pt had returned home with HHPT prior to this admission.    OT comments  Pt. Hesitant for participation secondary to increased c/o R shoulder pain.  Unable to tolerate PROM or massage and stretch to SCM muscle and clavicle areas.  Pt. Upset as she wants to participate but "just cant".  Notified PT per pt. Request that she can not tolerate skilled PT today either but states she will try again tomorrow.    Follow Up Recommendations  LTACH;SNF    Equipment Recommendations  None recommended by OT    Recommendations for Other Services      Precautions / Restrictions Precautions Precautions: Fall       Mobility Bed Mobility                  Transfers                      Balance                                   ADL                                                Vision                     Perception     Praxis      Cognition                             Extremity/Trunk Assessment               Exercises General Exercises - Upper Extremity Shoulder Flexion: PROM;5 reps;Right Other Exercises Other Exercises: attempted light stretch and massage to right SCM and clavicle areas secondary to c/o significant pain in R shoulder area.  pt. able to complete light  reach with  RUE, and some partial shoulder flexion but unable to complete prior to sever c/o pain   Shoulder Instructions       General Comments      Pertinent Vitals/ Pain          Home Living                                          Prior Functioning/Environment              Frequency Min 2X/week     Progress Toward Goals  OT Goals(current goals can now be found in the care plan section)  Progress towards OT goals: Progressing toward goals     Plan Discharge plan  remains appropriate    Co-evaluation                 End of Session     Activity Tolerance Patient limited by pain   Patient Left in bed;with call bell/phone within reach;with family/visitor present   Nurse Communication Patient requests pain meds        Time: 5465-6812 OT Time Calculation (min): 20 min  Charges: OT General Charges $OT Visit: 1 Procedure OT Treatments $Self Care/Home Management : 8-22 mins  Janice Coffin, COTA/L 10/26/2014, 9:37 AM

## 2014-10-27 LAB — VITAMIN B12: VITAMIN B 12: 981 pg/mL — AB (ref 211–911)

## 2014-10-27 LAB — URINALYSIS, ROUTINE W REFLEX MICROSCOPIC
BILIRUBIN URINE: NEGATIVE
GLUCOSE, UA: NEGATIVE mg/dL
KETONES UR: NEGATIVE mg/dL
Nitrite: NEGATIVE
PROTEIN: NEGATIVE mg/dL
Specific Gravity, Urine: 1.01 (ref 1.005–1.030)
Urobilinogen, UA: 0.2 mg/dL (ref 0.0–1.0)
pH: 6 (ref 5.0–8.0)

## 2014-10-27 LAB — TSH: TSH: 9.18 u[IU]/mL — AB (ref 0.350–4.500)

## 2014-10-27 LAB — CBC WITH DIFFERENTIAL/PLATELET
BASOS ABS: 0 10*3/uL (ref 0.0–0.1)
BASOS PCT: 0 % (ref 0–1)
EOS ABS: 0.1 10*3/uL (ref 0.0–0.7)
Eosinophils Relative: 1 % (ref 0–5)
HCT: 24.6 % — ABNORMAL LOW (ref 36.0–46.0)
HEMOGLOBIN: 7.9 g/dL — AB (ref 12.0–15.0)
Lymphocytes Relative: 7 % — ABNORMAL LOW (ref 12–46)
Lymphs Abs: 0.6 10*3/uL — ABNORMAL LOW (ref 0.7–4.0)
MCH: 31.7 pg (ref 26.0–34.0)
MCHC: 32.1 g/dL (ref 30.0–36.0)
MCV: 98.8 fL (ref 78.0–100.0)
MONO ABS: 0.3 10*3/uL (ref 0.1–1.0)
Monocytes Relative: 4 % (ref 3–12)
Neutro Abs: 7.6 10*3/uL (ref 1.7–7.7)
Neutrophils Relative %: 88 % — ABNORMAL HIGH (ref 43–77)
Platelets: 255 10*3/uL (ref 150–400)
RBC: 2.49 MIL/uL — ABNORMAL LOW (ref 3.87–5.11)
RDW: 18.8 % — AB (ref 11.5–15.5)
WBC: 8.6 10*3/uL (ref 4.0–10.5)

## 2014-10-27 LAB — SEDIMENTATION RATE: Sed Rate: 140 mm/hr — ABNORMAL HIGH (ref 0–22)

## 2014-10-27 LAB — COMPREHENSIVE METABOLIC PANEL
ALK PHOS: 173 U/L — AB (ref 39–117)
ALT: 14 U/L (ref 0–35)
AST: 19 U/L (ref 0–37)
Albumin: 1.3 g/dL — ABNORMAL LOW (ref 3.5–5.2)
Anion gap: 10 (ref 5–15)
BUN: 80 mg/dL — ABNORMAL HIGH (ref 6–23)
CO2: 34 mEq/L — ABNORMAL HIGH (ref 19–32)
Calcium: 8.2 mg/dL — ABNORMAL LOW (ref 8.4–10.5)
Chloride: 90 mEq/L — ABNORMAL LOW (ref 96–112)
Creatinine, Ser: 2.21 mg/dL — ABNORMAL HIGH (ref 0.50–1.10)
GFR calc Af Amer: 26 mL/min — ABNORMAL LOW (ref >90)
GFR, EST NON AFRICAN AMERICAN: 22 mL/min — AB (ref >90)
Glucose, Bld: 150 mg/dL — ABNORMAL HIGH (ref 70–99)
Potassium: 4.3 mEq/L (ref 3.7–5.3)
SODIUM: 134 meq/L — AB (ref 137–147)
Total Bilirubin: 0.9 mg/dL (ref 0.3–1.2)
Total Protein: 6.2 g/dL (ref 6.0–8.3)

## 2014-10-27 LAB — C-REACTIVE PROTEIN: CRP: 17.7 mg/dL — AB (ref <0.60)

## 2014-10-27 LAB — MAGNESIUM: Magnesium: 1.7 mg/dL (ref 1.5–2.5)

## 2014-10-27 LAB — GLUCOSE, CAPILLARY: Glucose-Capillary: 157 mg/dL — ABNORMAL HIGH (ref 70–99)

## 2014-10-27 LAB — URINE MICROSCOPIC-ADD ON: RBC / HPF: NONE SEEN RBC/hpf (ref <3)

## 2014-10-27 LAB — PRO B NATRIURETIC PEPTIDE: Pro B Natriuretic peptide (BNP): 8290 pg/mL — ABNORMAL HIGH (ref 0–125)

## 2014-10-27 LAB — FERRITIN: Ferritin: 642 ng/mL — ABNORMAL HIGH (ref 10–291)

## 2014-10-27 LAB — PROTIME-INR
INR: 2.37 — ABNORMAL HIGH (ref 0.00–1.49)
Prothrombin Time: 26.1 seconds — ABNORMAL HIGH (ref 11.6–15.2)

## 2014-10-27 LAB — HEMOGLOBIN A1C
HEMOGLOBIN A1C: 5.9 % — AB (ref <5.7)
Mean Plasma Glucose: 123 mg/dL — ABNORMAL HIGH (ref <117)

## 2014-10-27 LAB — FOLATE RBC: RBC Folate: 1237 ng/mL — ABNORMAL HIGH (ref >280)

## 2014-10-27 LAB — PHOSPHORUS: Phosphorus: 3.5 mg/dL (ref 2.3–4.6)

## 2014-10-27 LAB — T4, FREE: Free T4: 1.72 ng/dL (ref 0.80–1.80)

## 2014-10-27 LAB — PROCALCITONIN: Procalcitonin: 0.34 ng/mL

## 2014-10-28 LAB — PROTIME-INR
INR: 2.08 — ABNORMAL HIGH (ref 0.00–1.49)
Prothrombin Time: 23.6 seconds — ABNORMAL HIGH (ref 11.6–15.2)

## 2014-10-28 LAB — CBC WITH DIFFERENTIAL/PLATELET
BASOS ABS: 0 10*3/uL (ref 0.0–0.1)
Basophils Relative: 0 % (ref 0–1)
EOS PCT: 0 % (ref 0–5)
Eosinophils Absolute: 0 10*3/uL (ref 0.0–0.7)
HEMATOCRIT: 24.9 % — AB (ref 36.0–46.0)
HEMOGLOBIN: 7.9 g/dL — AB (ref 12.0–15.0)
Lymphocytes Relative: 7 % — ABNORMAL LOW (ref 12–46)
Lymphs Abs: 0.6 10*3/uL — ABNORMAL LOW (ref 0.7–4.0)
MCH: 32.1 pg (ref 26.0–34.0)
MCHC: 31.7 g/dL (ref 30.0–36.0)
MCV: 101.2 fL — ABNORMAL HIGH (ref 78.0–100.0)
Monocytes Absolute: 0.4 10*3/uL (ref 0.1–1.0)
Monocytes Relative: 4 % (ref 3–12)
NEUTROS ABS: 8.1 10*3/uL — AB (ref 1.7–7.7)
Neutrophils Relative %: 89 % — ABNORMAL HIGH (ref 43–77)
Platelets: 293 10*3/uL (ref 150–400)
RBC: 2.46 MIL/uL — ABNORMAL LOW (ref 3.87–5.11)
RDW: 18.9 % — ABNORMAL HIGH (ref 11.5–15.5)
WBC: 9.1 10*3/uL (ref 4.0–10.5)

## 2014-10-28 LAB — BASIC METABOLIC PANEL
ANION GAP: 11 (ref 5–15)
BUN: 82 mg/dL — ABNORMAL HIGH (ref 6–23)
CALCIUM: 8.1 mg/dL — AB (ref 8.4–10.5)
CO2: 34 mEq/L — ABNORMAL HIGH (ref 19–32)
Chloride: 89 mEq/L — ABNORMAL LOW (ref 96–112)
Creatinine, Ser: 2.33 mg/dL — ABNORMAL HIGH (ref 0.50–1.10)
GFR calc Af Amer: 24 mL/min — ABNORMAL LOW (ref >90)
GFR, EST NON AFRICAN AMERICAN: 21 mL/min — AB (ref >90)
GLUCOSE: 122 mg/dL — AB (ref 70–99)
POTASSIUM: 4.4 meq/L (ref 3.7–5.3)
SODIUM: 134 meq/L — AB (ref 137–147)

## 2014-10-28 LAB — URINE CULTURE
Colony Count: 30000
SPECIAL REQUESTS: NORMAL

## 2014-10-28 LAB — PHOSPHORUS: PHOSPHORUS: 3.9 mg/dL (ref 2.3–4.6)

## 2014-10-28 LAB — MAGNESIUM: MAGNESIUM: 1.8 mg/dL (ref 1.5–2.5)

## 2014-10-29 ENCOUNTER — Other Ambulatory Visit (HOSPITAL_COMMUNITY): Payer: Self-pay

## 2014-10-29 LAB — BASIC METABOLIC PANEL
Anion gap: 13 (ref 5–15)
BUN: 88 mg/dL — AB (ref 6–23)
CALCIUM: 8.1 mg/dL — AB (ref 8.4–10.5)
CO2: 31 mEq/L (ref 19–32)
Chloride: 88 mEq/L — ABNORMAL LOW (ref 96–112)
Creatinine, Ser: 2.59 mg/dL — ABNORMAL HIGH (ref 0.50–1.10)
GFR calc non Af Amer: 18 mL/min — ABNORMAL LOW (ref >90)
GFR, EST AFRICAN AMERICAN: 21 mL/min — AB (ref >90)
GLUCOSE: 143 mg/dL — AB (ref 70–99)
Potassium: 5.1 mEq/L (ref 3.7–5.3)
SODIUM: 132 meq/L — AB (ref 137–147)

## 2014-10-29 LAB — CBC
HCT: 24.5 % — ABNORMAL LOW (ref 36.0–46.0)
HEMOGLOBIN: 7.8 g/dL — AB (ref 12.0–15.0)
MCH: 31.5 pg (ref 26.0–34.0)
MCHC: 31.8 g/dL (ref 30.0–36.0)
MCV: 98.8 fL (ref 78.0–100.0)
Platelets: 283 10*3/uL (ref 150–400)
RBC: 2.48 MIL/uL — ABNORMAL LOW (ref 3.87–5.11)
RDW: 19 % — AB (ref 11.5–15.5)
WBC: 11.3 10*3/uL — ABNORMAL HIGH (ref 4.0–10.5)

## 2014-10-29 LAB — PROTIME-INR
INR: 1.98 — AB (ref 0.00–1.49)
Prothrombin Time: 22.6 seconds — ABNORMAL HIGH (ref 11.6–15.2)

## 2014-10-29 NOTE — Telephone Encounter (Signed)
Pt currently in long term acute care setting.

## 2014-10-30 LAB — VITAMIN D 1,25 DIHYDROXY
Vitamin D 1, 25 (OH)2 Total: 37 pg/mL (ref 18–72)
Vitamin D2 1, 25 (OH)2: 14 pg/mL
Vitamin D3 1, 25 (OH)2: 23 pg/mL

## 2014-10-30 LAB — PREPARE RBC (CROSSMATCH)

## 2014-10-30 LAB — PROTIME-INR
INR: 1.9 — ABNORMAL HIGH (ref 0.00–1.49)
Prothrombin Time: 22 seconds — ABNORMAL HIGH (ref 11.6–15.2)

## 2014-10-30 LAB — RENAL FUNCTION PANEL
ALBUMIN: 1.4 g/dL — AB (ref 3.5–5.2)
Anion gap: 11 (ref 5–15)
BUN: 91 mg/dL — ABNORMAL HIGH (ref 6–23)
CO2: 32 mEq/L (ref 19–32)
CREATININE: 2.8 mg/dL — AB (ref 0.50–1.10)
Calcium: 8 mg/dL — ABNORMAL LOW (ref 8.4–10.5)
Chloride: 89 mEq/L — ABNORMAL LOW (ref 96–112)
GFR calc Af Amer: 19 mL/min — ABNORMAL LOW (ref >90)
GFR calc non Af Amer: 17 mL/min — ABNORMAL LOW (ref >90)
GLUCOSE: 176 mg/dL — AB (ref 70–99)
Phosphorus: 4.6 mg/dL (ref 2.3–4.6)
Potassium: 4.7 mEq/L (ref 3.7–5.3)
Sodium: 132 mEq/L — ABNORMAL LOW (ref 137–147)

## 2014-10-30 LAB — CBC
HEMATOCRIT: 22 % — AB (ref 36.0–46.0)
HEMOGLOBIN: 7.1 g/dL — AB (ref 12.0–15.0)
MCH: 32 pg (ref 26.0–34.0)
MCHC: 32.3 g/dL (ref 30.0–36.0)
MCV: 99.1 fL (ref 78.0–100.0)
Platelets: 232 10*3/uL (ref 150–400)
RBC: 2.22 MIL/uL — ABNORMAL LOW (ref 3.87–5.11)
RDW: 18.6 % — AB (ref 11.5–15.5)
WBC: 10.9 10*3/uL — ABNORMAL HIGH (ref 4.0–10.5)

## 2014-10-31 ENCOUNTER — Other Ambulatory Visit (HOSPITAL_COMMUNITY): Payer: Medicare Other

## 2014-10-31 LAB — CBC
HEMATOCRIT: 25.4 % — AB (ref 36.0–46.0)
HEMOGLOBIN: 8.2 g/dL — AB (ref 12.0–15.0)
MCH: 31.3 pg (ref 26.0–34.0)
MCHC: 32.3 g/dL (ref 30.0–36.0)
MCV: 96.9 fL (ref 78.0–100.0)
Platelets: 183 10*3/uL (ref 150–400)
RBC: 2.62 MIL/uL — ABNORMAL LOW (ref 3.87–5.11)
RDW: 19.9 % — ABNORMAL HIGH (ref 11.5–15.5)
WBC: 7.7 10*3/uL (ref 4.0–10.5)

## 2014-10-31 LAB — BASIC METABOLIC PANEL
Anion gap: 12 (ref 5–15)
BUN: 93 mg/dL — ABNORMAL HIGH (ref 6–23)
CALCIUM: 8.1 mg/dL — AB (ref 8.4–10.5)
CO2: 30 meq/L (ref 19–32)
Chloride: 90 mEq/L — ABNORMAL LOW (ref 96–112)
Creatinine, Ser: 3.02 mg/dL — ABNORMAL HIGH (ref 0.50–1.10)
GFR calc Af Amer: 17 mL/min — ABNORMAL LOW (ref >90)
GFR calc non Af Amer: 15 mL/min — ABNORMAL LOW (ref >90)
GLUCOSE: 118 mg/dL — AB (ref 70–99)
Potassium: 4.8 mEq/L (ref 3.7–5.3)
Sodium: 132 mEq/L — ABNORMAL LOW (ref 137–147)

## 2014-10-31 LAB — TYPE AND SCREEN
ABO/RH(D): O POS
Antibody Screen: NEGATIVE
Unit division: 0

## 2014-10-31 LAB — IRON AND TIBC
Iron: 28 ug/dL — ABNORMAL LOW (ref 42–135)
Saturation Ratios: 24 % (ref 20–55)
TIBC: 116 ug/dL — AB (ref 250–470)
UIBC: 88 ug/dL — AB (ref 125–400)

## 2014-10-31 LAB — WOUND CULTURE
GRAM STAIN: NONE SEEN
Special Requests: NORMAL

## 2014-10-31 LAB — PROTIME-INR
INR: 2.18 — ABNORMAL HIGH (ref 0.00–1.49)
PROTHROMBIN TIME: 24.4 s — AB (ref 11.6–15.2)

## 2014-10-31 LAB — PRO B NATRIURETIC PEPTIDE: PRO B NATRI PEPTIDE: 9632 pg/mL — AB (ref 0–125)

## 2014-11-01 ENCOUNTER — Encounter (HOSPITAL_COMMUNITY): Payer: Self-pay | Admitting: General Surgery

## 2014-11-01 LAB — COMPREHENSIVE METABOLIC PANEL
ALT: <5 U/L (ref 0–35)
AST: 23 U/L (ref 0–37)
Albumin: 2.1 g/dL — ABNORMAL LOW (ref 3.5–5.2)
Alkaline Phosphatase: 145 U/L — ABNORMAL HIGH (ref 39–117)
Anion gap: 14 (ref 5–15)
BUN: 91 mg/dL — ABNORMAL HIGH (ref 6–23)
CALCIUM: 8 mg/dL — AB (ref 8.4–10.5)
CO2: 31 mEq/L (ref 19–32)
Chloride: 88 mEq/L — ABNORMAL LOW (ref 96–112)
Creatinine, Ser: 2.91 mg/dL — ABNORMAL HIGH (ref 0.50–1.10)
GFR calc Af Amer: 18 mL/min — ABNORMAL LOW (ref >90)
GFR calc non Af Amer: 16 mL/min — ABNORMAL LOW (ref >90)
Glucose, Bld: 127 mg/dL — ABNORMAL HIGH (ref 70–99)
Potassium: 4.1 mEq/L (ref 3.7–5.3)
Sodium: 133 mEq/L — ABNORMAL LOW (ref 137–147)
TOTAL PROTEIN: 6 g/dL (ref 6.0–8.3)
Total Bilirubin: 1.1 mg/dL (ref 0.3–1.2)

## 2014-11-01 LAB — CBC WITH DIFFERENTIAL/PLATELET
BASOS ABS: 0 10*3/uL (ref 0.0–0.1)
Basophils Relative: 0 % (ref 0–1)
Eosinophils Absolute: 0 10*3/uL (ref 0.0–0.7)
Eosinophils Relative: 1 % (ref 0–5)
HCT: 22.3 % — ABNORMAL LOW (ref 36.0–46.0)
Hemoglobin: 7.2 g/dL — ABNORMAL LOW (ref 12.0–15.0)
Lymphocytes Relative: 6 % — ABNORMAL LOW (ref 12–46)
Lymphs Abs: 0.5 10*3/uL — ABNORMAL LOW (ref 0.7–4.0)
MCH: 31.4 pg (ref 26.0–34.0)
MCHC: 32.3 g/dL (ref 30.0–36.0)
MCV: 97.4 fL (ref 78.0–100.0)
Monocytes Absolute: 0.4 10*3/uL (ref 0.1–1.0)
Monocytes Relative: 5 % (ref 3–12)
Neutro Abs: 7.3 10*3/uL (ref 1.7–7.7)
Neutrophils Relative %: 88 % — ABNORMAL HIGH (ref 43–77)
PLATELETS: 181 10*3/uL (ref 150–400)
RBC: 2.29 MIL/uL — ABNORMAL LOW (ref 3.87–5.11)
RDW: 19.6 % — AB (ref 11.5–15.5)
WBC: 8.2 10*3/uL (ref 4.0–10.5)

## 2014-11-01 LAB — PROTIME-INR
INR: 2.29 — ABNORMAL HIGH (ref 0.00–1.49)
Prothrombin Time: 25.4 seconds — ABNORMAL HIGH (ref 11.6–15.2)

## 2014-11-01 LAB — MAGNESIUM: Magnesium: 1.9 mg/dL (ref 1.5–2.5)

## 2014-11-01 LAB — PHOSPHORUS: PHOSPHORUS: 4.8 mg/dL — AB (ref 2.3–4.6)

## 2014-11-05 ENCOUNTER — Telehealth: Payer: Self-pay | Admitting: Internal Medicine

## 2014-11-05 NOTE — Telephone Encounter (Signed)
We received notification from [name] that patient was admitted to [facility] on 11/03/14.  I spoke with Ms. Wagner @ [phone] today to advise that we appreciate her letting us know of this admission.  However, Dr. Renold Genta doesn't treat or follow/provide orders for patients who are in [facility].  [name] advised that we are on a Consult basis only.  The [facility] is doing the orders for patient care and they will just keep Dr. Renold Genta aware of patient status.

## 2014-11-24 ENCOUNTER — Ambulatory Visit: Payer: Self-pay | Admitting: Pharmacist Clinician (PhC)/ Clinical Pharmacy Specialist

## 2014-11-30 DEATH — deceased

## 2014-12-28 ENCOUNTER — Encounter: Payer: Self-pay | Admitting: Internal Medicine

## 2015-01-12 ENCOUNTER — Encounter: Payer: Self-pay | Admitting: Internal Medicine

## 2015-01-13 ENCOUNTER — Encounter (HOSPITAL_COMMUNITY): Payer: Self-pay | Admitting: Cardiology

## 2015-03-02 ENCOUNTER — Encounter: Payer: Self-pay | Admitting: *Deleted

## 2015-03-08 ENCOUNTER — Telehealth: Payer: Self-pay | Admitting: Cardiovascular Disease

## 2015-03-08 NOTE — Telephone Encounter (Signed)
Informed husband that our system still had her listed as "active". Changed status in [facility]. Husband voiced understanding.

## 2015-03-08 NOTE — Telephone Encounter (Signed)
New message       Pt got letter about not receiving device transmissions-----she is deceased

## 2015-03-20 IMAGING — CR DG ABD PORTABLE 1V
2 series · 2 of 2 positions shown · non-contrast
Comparison: CT Abdomen and Pelvis 07/28/2014.

CLINICAL DATA: 64-year-old female with constipation. Initial
encounter.

EXAM:
PORTABLE ABDOMEN - 1 VIEW

[supine ap (1 of 2)]
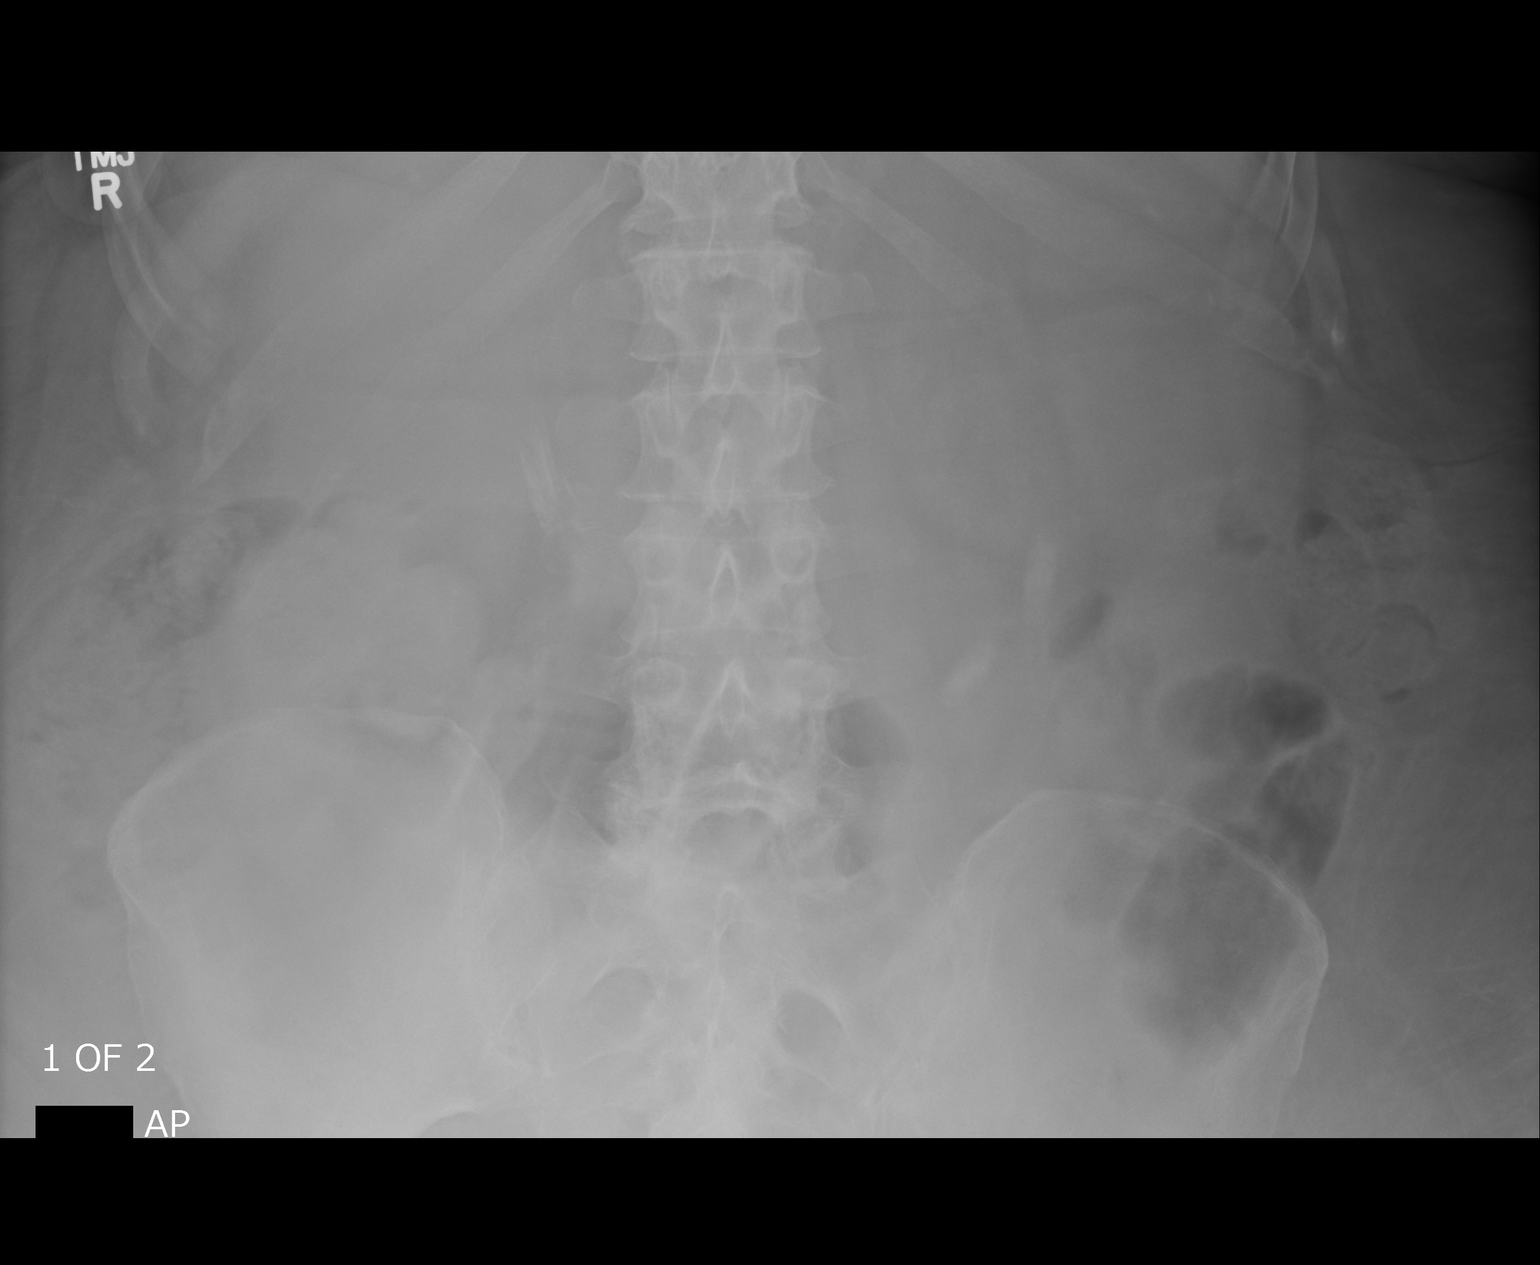

[supine ap (2 of 2)]
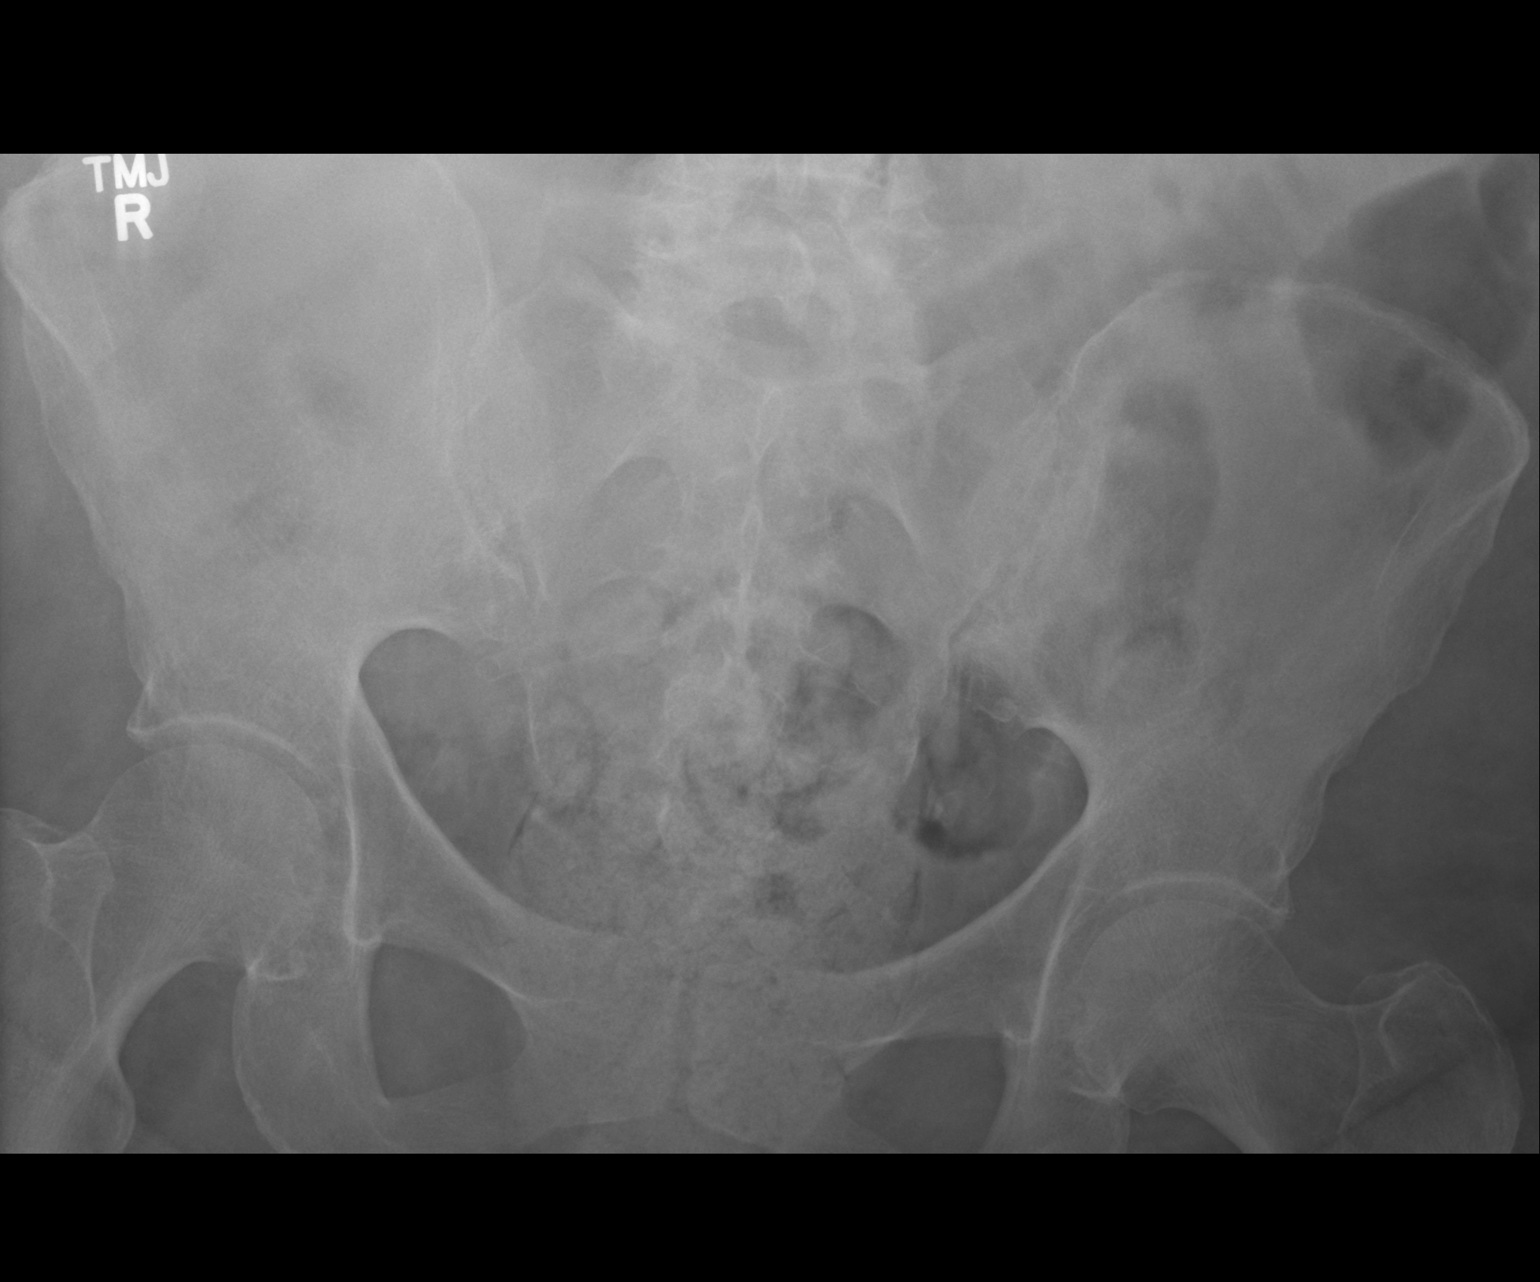

[2 of 2 positions shown; findings below may reference images not displayed]

FINDINGS: Portable supine AP views of the abdomen and pelvis. Non obstructed
bowel gas pattern. Mild motion artifact. Retained stool in the
colon, specially the rectum. Retained stool in the left colon
appears decreased compared to [place]. Stable cholecystectomy clips.
Stable visualized osseous structures. No definite pneumoperitoneum
on these supine views.
IMPRESSION: Non obstructed bowel gas pattern but stool ball in the rectum
raising the possibility of distal fecal impaction.

## 2015-03-28 IMAGING — XA IR FLUORO GUIDE CV LINE*R*
1 series · 2 of 2 positions shown · non-contrast
Comparison: none

INDICATION: Poor venous access. In need of intravenous access for medication
administration and blood draws.

[Series 1: run · 2 of 2 slices shown]
[im 1/2]
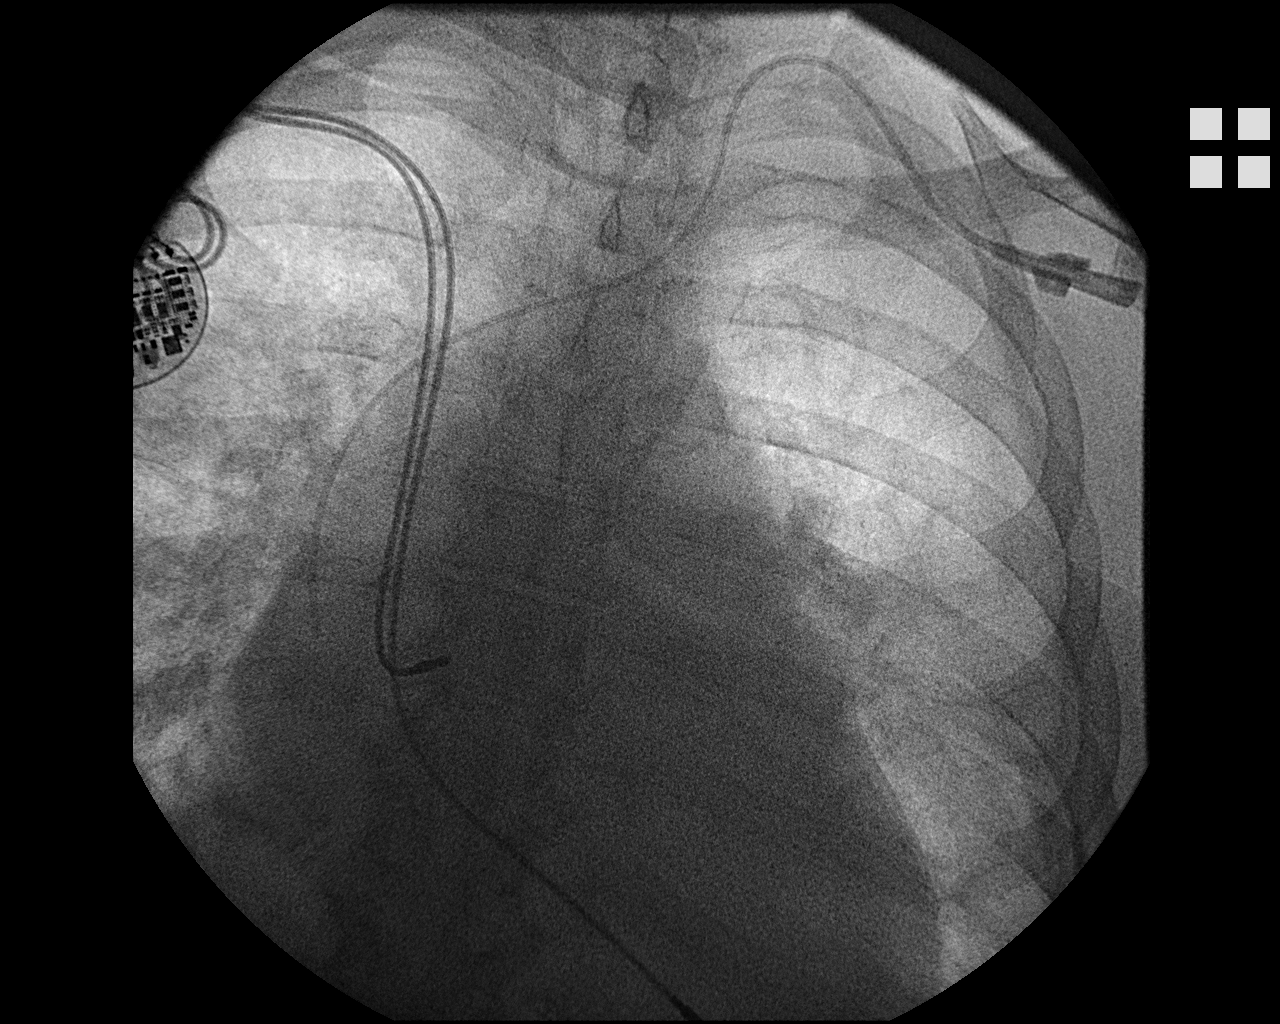
[im 2/2]
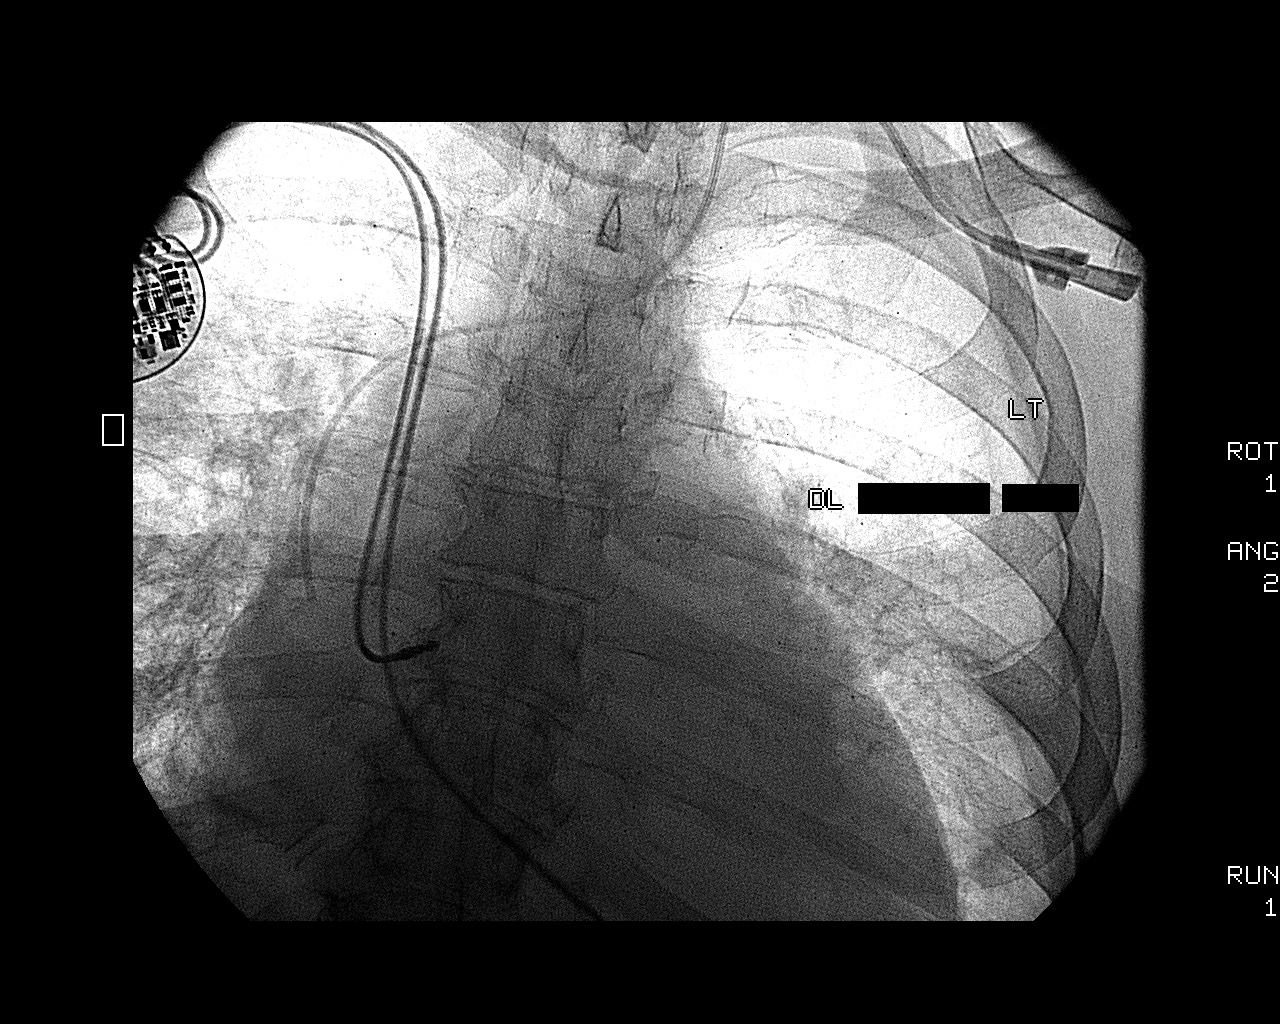

[2 of 2 positions shown; findings below may reference images not displayed]

EXAM:
TUNNELED PICC PLACEMENT WITH ULTRASOUND AND FLUOROSCOPIC GUIDANCE

MEDICATIONS:
The patient is currently admitted to the hospital and receiving
intravenous antibiotics. The IV antibiotic was given in an
appropriate time interval prior to skin puncture.

CONTRAST:  None

ANESTHESIA/SEDATION:
None

FLUOROSCOPY TIME:  54 seconds.

COMPLICATIONS:
None immediate

PROCEDURE:
Informed written consent was obtained from the patient after a
discussion of the risks, benefits, and alternatives to treatment.
Questions regarding the procedure were encouraged and answered.
Given the presence of the patient's existing right anterior chest
wall pacemaker, the decision was made to place a left internal
jugular tunneled PICC line. The left neck and chest were prepped
with chlorhexidine in a sterile fashion, and a sterile drape was
applied covering the operative field. Maximum barrier sterile
technique with sterile gowns and gloves were used for the procedure.
A timeout was performed prior to the initiation of the procedure.

After creating a small venotomy incision, a micropuncture kit was
utilized to access the left internal jugular vein under direct,
real-time ultrasound guidance after the overlying soft tissues were
anesthetized with 1% lidocaine with epinephrine. Ultrasound image
documentation was performed. The microwire was kinked to measure
appropriate catheter length. A 25 cm tunneled double lumen PICC line
was tunneled in a retrograde fashion from the anterior chest wall to
the venotomy incision.

The catheter was then placed through the peel-away sheath with tips
ultimately positioned within the superior cavoatrial junction. Final
catheter positioning was confirmed and documented with a spot
radiographic image. The catheter aspirates and flushes normally.

The catheter exit site was secured with a 2-0 Prolene retention
suture. The venotomy incision was closed with Dermabond and
Ndiyapo. Dressings were applied. The patient tolerated the
procedure well without immediate post procedural complication.
IMPRESSION: Successful placement of 25 cm tip to cuff tunneled PICC via the left
internal jugular vein with tips terminating within the superior
cavoatrial junction. The catheter is ready for immediate use.

## 2015-04-04 IMAGING — CR DG CHEST 1V PORT
1 series · 1 of 1 positions shown · non-contrast
Comparison: 09/14/2014

CLINICAL DATA: Difficulty breathing

EXAM:
PORTABLE CHEST - 1 VIEW

[AP]
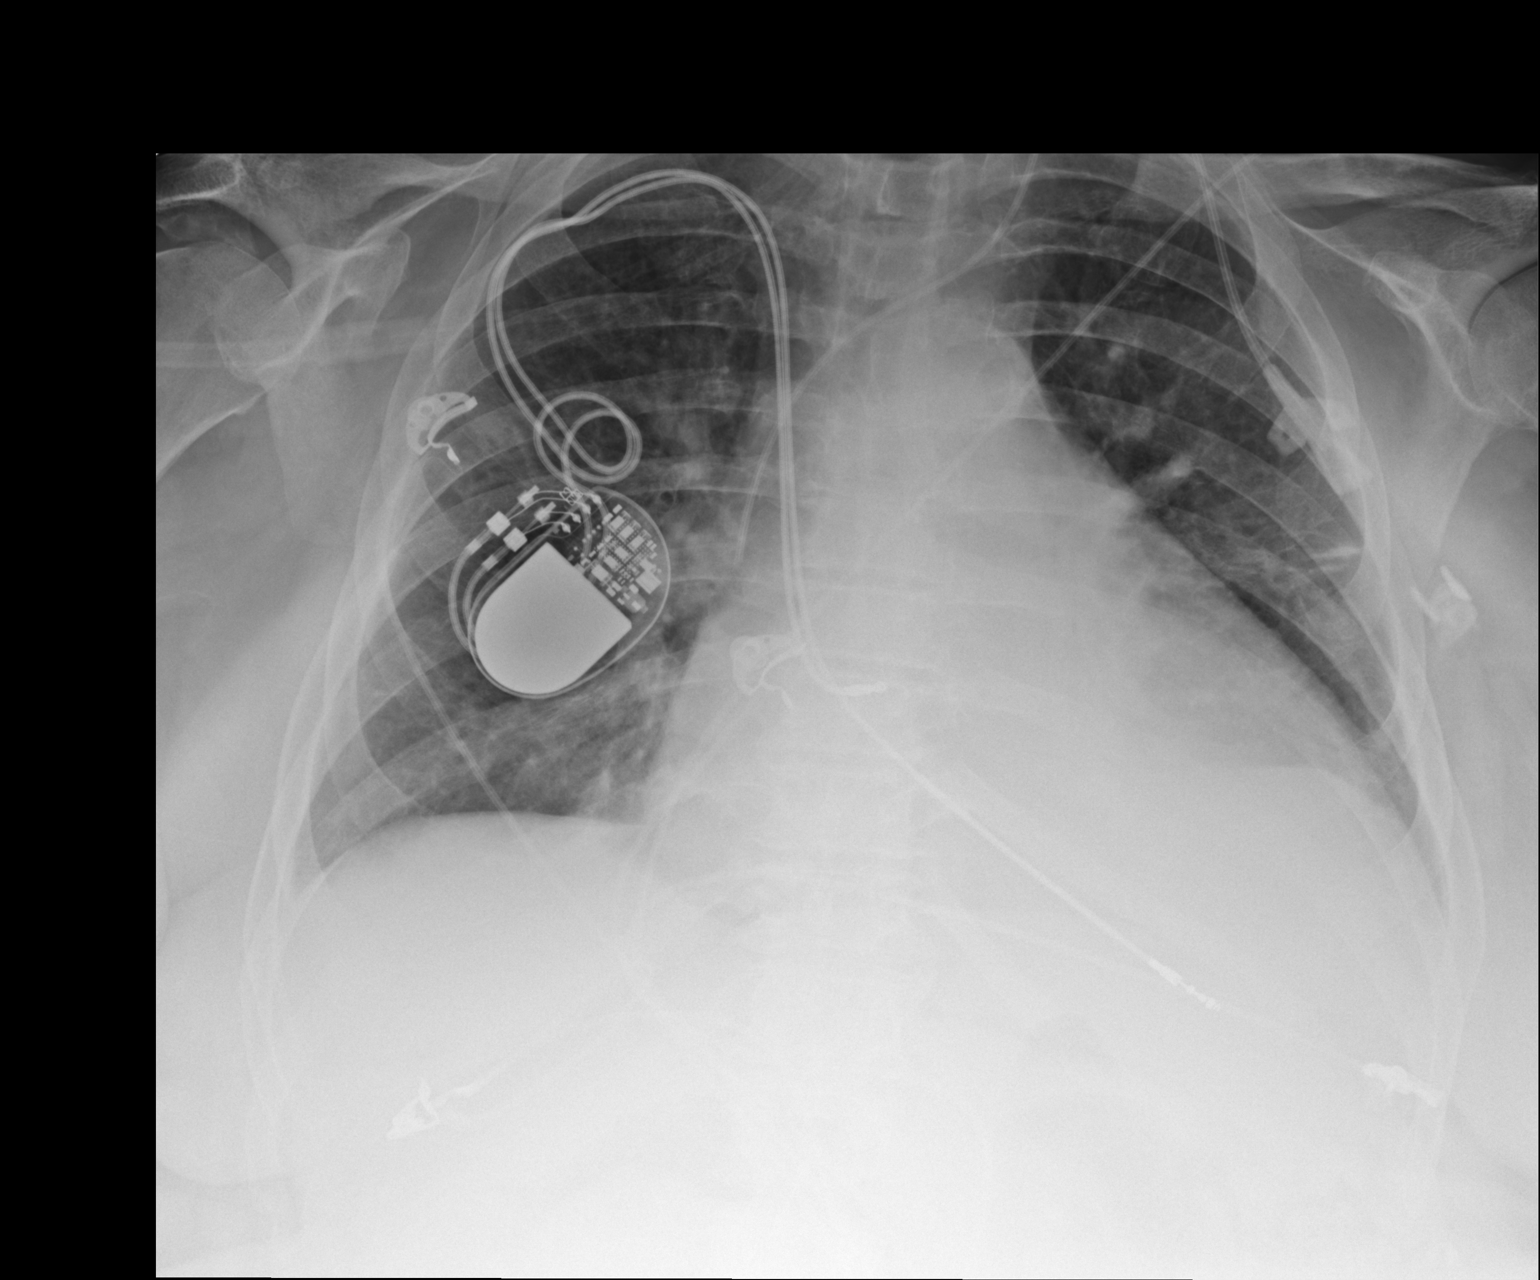

[1 of 1 positions shown; findings below may reference images not displayed]

FINDINGS: Cardiac shadow remains enlarged. A left-sided jugular PICC line is
again noted in the mid to distal superior vena cava. A pacing device
is again seen and stable. Mild elevation of left hemidiaphragm is
noted with left basilar atelectasis. Two view chest may be helpful
when the patient's condition improves. No other focal abnormality is
noted.
IMPRESSION: Left basilar atelectasis.

No other focal abnormality is noted.

## 2015-04-12 ENCOUNTER — Ambulatory Visit: Payer: Medicare Other | Admitting: Internal Medicine

## 2015-06-09 ENCOUNTER — Encounter (HOSPITAL_COMMUNITY): Payer: Self-pay | Admitting: Cardiology
